# Patient Record
Sex: Male | Born: 1940 | Race: White | Hispanic: No | Marital: Married | Smoking: Former smoker
Health system: Southern US, Community
[De-identification: ages and names within clinical notes are randomized; demographics above are authoritative.]

## PROBLEM LIST (undated history)

## (undated) DIAGNOSIS — M545 Low back pain, unspecified: Secondary | ICD-10-CM

## (undated) DIAGNOSIS — Z860100 Personal history of colon polyps, unspecified: Secondary | ICD-10-CM

## (undated) DIAGNOSIS — I10 Essential (primary) hypertension: Secondary | ICD-10-CM

## (undated) DIAGNOSIS — R413 Other amnesia: Secondary | ICD-10-CM

## (undated) DIAGNOSIS — J189 Pneumonia, unspecified organism: Secondary | ICD-10-CM

## (undated) DIAGNOSIS — R3915 Urgency of urination: Secondary | ICD-10-CM

## (undated) DIAGNOSIS — M1990 Unspecified osteoarthritis, unspecified site: Secondary | ICD-10-CM

## (undated) DIAGNOSIS — G5760 Lesion of plantar nerve, unspecified lower limb: Secondary | ICD-10-CM

## (undated) DIAGNOSIS — M62838 Other muscle spasm: Secondary | ICD-10-CM

## (undated) DIAGNOSIS — K5790 Diverticulosis of intestine, part unspecified, without perforation or abscess without bleeding: Secondary | ICD-10-CM

## (undated) DIAGNOSIS — N189 Chronic kidney disease, unspecified: Secondary | ICD-10-CM

## (undated) DIAGNOSIS — R350 Frequency of micturition: Secondary | ICD-10-CM

## (undated) DIAGNOSIS — N183 Chronic kidney disease, stage 3 (moderate): Secondary | ICD-10-CM

## (undated) DIAGNOSIS — Z9289 Personal history of other medical treatment: Secondary | ICD-10-CM

## (undated) DIAGNOSIS — R600 Localized edema: Secondary | ICD-10-CM

## (undated) DIAGNOSIS — R609 Edema, unspecified: Secondary | ICD-10-CM

## (undated) DIAGNOSIS — R079 Chest pain, unspecified: Secondary | ICD-10-CM

## (undated) DIAGNOSIS — M069 Rheumatoid arthritis, unspecified: Secondary | ICD-10-CM

## (undated) DIAGNOSIS — G459 Transient cerebral ischemic attack, unspecified: Secondary | ICD-10-CM

## (undated) DIAGNOSIS — R001 Bradycardia, unspecified: Secondary | ICD-10-CM

## (undated) DIAGNOSIS — N400 Enlarged prostate without lower urinary tract symptoms: Secondary | ICD-10-CM

## (undated) DIAGNOSIS — I6529 Occlusion and stenosis of unspecified carotid artery: Secondary | ICD-10-CM

## (undated) DIAGNOSIS — M2550 Pain in unspecified joint: Secondary | ICD-10-CM

## (undated) DIAGNOSIS — G4700 Insomnia, unspecified: Secondary | ICD-10-CM

## (undated) DIAGNOSIS — IMO0001 Reserved for inherently not codable concepts without codable children: Secondary | ICD-10-CM

## (undated) DIAGNOSIS — G8929 Other chronic pain: Secondary | ICD-10-CM

## (undated) DIAGNOSIS — E785 Hyperlipidemia, unspecified: Secondary | ICD-10-CM

## (undated) DIAGNOSIS — I00 Rheumatic fever without heart involvement: Secondary | ICD-10-CM

## (undated) HISTORY — DX: Essential (primary) hypertension: I10

## (undated) HISTORY — PX: COLONOSCOPY: SHX174

## (undated) HISTORY — PX: TESTICLAR CYST EXCISION: SHX2492

## (undated) HISTORY — DX: Chronic kidney disease, stage 3 (moderate): N18.3

## (undated) HISTORY — DX: Occlusion and stenosis of unspecified carotid artery: I65.29

## (undated) HISTORY — DX: Hyperlipidemia, unspecified: E78.5

## (undated) HISTORY — PX: VASECTOMY: SHX75

## (undated) HISTORY — PX: TONSILLECTOMY: SUR1361

## (undated) HISTORY — PX: INGUINAL HERNIA REPAIR: SUR1180

## (undated) HISTORY — DX: Lesion of plantar nerve, unspecified lower limb: G57.60

## (undated) HISTORY — PX: CATARACT EXTRACTION, BILATERAL: SHX1313

---
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
## 1998-11-03 ENCOUNTER — Other Ambulatory Visit: Admission: RE | Admit: 1998-11-03 | Discharge: 1998-11-03 | Payer: Self-pay | Admitting: Gastroenterology

## 2004-07-26 ENCOUNTER — Ambulatory Visit: Payer: Self-pay | Admitting: Internal Medicine

## 2004-08-02 ENCOUNTER — Ambulatory Visit: Payer: Self-pay | Admitting: Internal Medicine

## 2004-08-30 ENCOUNTER — Ambulatory Visit: Payer: Self-pay | Admitting: Internal Medicine

## 2004-09-08 ENCOUNTER — Ambulatory Visit: Payer: Self-pay

## 2004-09-20 ENCOUNTER — Ambulatory Visit (HOSPITAL_COMMUNITY): Admission: RE | Admit: 2004-09-20 | Discharge: 2004-09-21 | Payer: Self-pay | Admitting: Vascular Surgery

## 2004-09-20 ENCOUNTER — Encounter (INDEPENDENT_AMBULATORY_CARE_PROVIDER_SITE_OTHER): Payer: Self-pay | Admitting: *Deleted

## 2004-09-20 HISTORY — PX: CAROTID ENDARTERECTOMY: SUR193

## 2004-10-04 ENCOUNTER — Ambulatory Visit: Payer: Self-pay | Admitting: Internal Medicine

## 2004-10-11 ENCOUNTER — Ambulatory Visit: Payer: Self-pay | Admitting: Internal Medicine

## 2004-12-13 ENCOUNTER — Ambulatory Visit: Payer: Self-pay | Admitting: Internal Medicine

## 2005-03-08 ENCOUNTER — Ambulatory Visit: Payer: Self-pay | Admitting: Internal Medicine

## 2005-03-15 ENCOUNTER — Ambulatory Visit: Payer: Self-pay | Admitting: Internal Medicine

## 2005-06-07 ENCOUNTER — Ambulatory Visit: Payer: Self-pay | Admitting: Internal Medicine

## 2005-06-14 ENCOUNTER — Ambulatory Visit: Payer: Self-pay | Admitting: Internal Medicine

## 2005-09-27 ENCOUNTER — Ambulatory Visit: Payer: Self-pay | Admitting: Internal Medicine

## 2005-12-06 ENCOUNTER — Ambulatory Visit: Payer: Self-pay | Admitting: Internal Medicine

## 2005-12-13 ENCOUNTER — Ambulatory Visit: Payer: Self-pay | Admitting: Internal Medicine

## 2006-01-26 ENCOUNTER — Ambulatory Visit: Payer: Self-pay | Admitting: Gastroenterology

## 2006-02-06 ENCOUNTER — Ambulatory Visit: Payer: Self-pay | Admitting: Gastroenterology

## 2006-02-06 ENCOUNTER — Encounter: Payer: Self-pay | Admitting: Gastroenterology

## 2006-02-20 ENCOUNTER — Ambulatory Visit: Payer: Self-pay | Admitting: Internal Medicine

## 2006-05-15 ENCOUNTER — Ambulatory Visit: Payer: Self-pay | Admitting: Internal Medicine

## 2006-07-07 ENCOUNTER — Ambulatory Visit: Payer: Self-pay | Admitting: Internal Medicine

## 2006-12-13 ENCOUNTER — Ambulatory Visit: Payer: Self-pay | Admitting: Internal Medicine

## 2007-01-03 DIAGNOSIS — E785 Hyperlipidemia, unspecified: Secondary | ICD-10-CM

## 2007-01-03 DIAGNOSIS — I10 Essential (primary) hypertension: Secondary | ICD-10-CM

## 2007-01-12 ENCOUNTER — Ambulatory Visit: Payer: Self-pay | Admitting: Vascular Surgery

## 2007-01-12 ENCOUNTER — Encounter: Payer: Self-pay | Admitting: Internal Medicine

## 2007-01-30 ENCOUNTER — Ambulatory Visit: Payer: Self-pay | Admitting: Internal Medicine

## 2007-01-30 LAB — CONVERTED CEMR LAB
AST: 24 units/L (ref 0–37)
Bilirubin, Direct: 0.1 mg/dL (ref 0.0–0.3)
CO2: 26 meq/L (ref 19–32)
Eosinophils Absolute: 0.3 10*3/uL (ref 0.0–0.6)
Eosinophils Relative: 4 % (ref 0.0–5.0)
GFR calc Af Amer: 96 mL/min
GFR calc non Af Amer: 79 mL/min
Glucose, Bld: 138 mg/dL — ABNORMAL HIGH (ref 70–99)
HCT: 38.4 % — ABNORMAL LOW (ref 39.0–52.0)
Hemoglobin: 13.3 g/dL (ref 13.0–17.0)
Ketones, urine, test strip: NEGATIVE
Lymphocytes Relative: 30.7 % (ref 12.0–46.0)
MCV: 88.4 fL (ref 78.0–100.0)
Neutro Abs: 4.2 10*3/uL (ref 1.4–7.7)
Neutrophils Relative %: 55.2 % (ref 43.0–77.0)
Nitrite: NEGATIVE
PSA: 1.46 ng/mL (ref 0.10–4.00)
Sodium: 138 meq/L (ref 135–145)
Specific Gravity, Urine: 1.02
TSH: 1.35 microintl units/mL (ref 0.35–5.50)
Total Protein: 7.1 g/dL (ref 6.0–8.3)
WBC Urine, dipstick: NEGATIVE
WBC: 7.7 10*3/uL (ref 4.5–10.5)

## 2007-02-05 ENCOUNTER — Ambulatory Visit: Payer: Self-pay | Admitting: Internal Medicine

## 2007-02-05 DIAGNOSIS — R7301 Impaired fasting glucose: Secondary | ICD-10-CM | POA: Insufficient documentation

## 2007-05-28 ENCOUNTER — Ambulatory Visit: Payer: Self-pay | Admitting: Internal Medicine

## 2007-05-28 LAB — CONVERTED CEMR LAB
Basophils Relative: 0.8 % (ref 0.0–1.0)
Bilirubin, Direct: 0.1 mg/dL (ref 0.0–0.3)
CO2: 27 meq/L (ref 19–32)
Cholesterol: 175 mg/dL (ref 0–200)
Creatinine, Ser: 1.2 mg/dL (ref 0.4–1.5)
Direct LDL: 55.2 mg/dL
Eosinophils Relative: 4.1 % (ref 0.0–5.0)
GFR calc Af Amer: 77 mL/min
Glucose, Bld: 138 mg/dL — ABNORMAL HIGH (ref 70–99)
Glucose, Urine, Semiquant: NEGATIVE
HCT: 39.7 % (ref 39.0–52.0)
Hemoglobin: 13.6 g/dL (ref 13.0–17.0)
Lymphocytes Relative: 33.6 % (ref 12.0–46.0)
Monocytes Absolute: 0.7 10*3/uL (ref 0.2–0.7)
Monocytes Relative: 8.8 % (ref 3.0–11.0)
Neutro Abs: 4.3 10*3/uL (ref 1.4–7.7)
Neutrophils Relative %: 52.7 % (ref 43.0–77.0)
Nitrite: NEGATIVE
Potassium: 5.4 meq/L — ABNORMAL HIGH (ref 3.5–5.1)
Protein, U semiquant: NEGATIVE
RDW: 12.3 % (ref 11.5–14.6)
Sodium: 140 meq/L (ref 135–145)
Specific Gravity, Urine: 1.015
TSH: 1.38 microintl units/mL (ref 0.35–5.50)
Total Bilirubin: 0.6 mg/dL (ref 0.3–1.2)
Total CHOL/HDL Ratio: 6.3
Total Protein: 7 g/dL (ref 6.0–8.3)
VLDL: 63 mg/dL — ABNORMAL HIGH (ref 0–40)
WBC Urine, dipstick: NEGATIVE

## 2007-06-04 ENCOUNTER — Ambulatory Visit: Payer: Self-pay | Admitting: Internal Medicine

## 2007-06-04 DIAGNOSIS — H900 Conductive hearing loss, bilateral: Secondary | ICD-10-CM

## 2007-06-04 DIAGNOSIS — N400 Enlarged prostate without lower urinary tract symptoms: Secondary | ICD-10-CM

## 2007-06-04 LAB — CONVERTED CEMR LAB: HDL goal, serum: 40 mg/dL

## 2007-06-12 ENCOUNTER — Encounter: Payer: Self-pay | Admitting: Internal Medicine

## 2007-06-15 ENCOUNTER — Encounter: Payer: Self-pay | Admitting: Internal Medicine

## 2007-08-02 ENCOUNTER — Ambulatory Visit: Payer: Self-pay | Admitting: Internal Medicine

## 2007-10-08 ENCOUNTER — Ambulatory Visit: Payer: Self-pay | Admitting: Internal Medicine

## 2007-10-08 DIAGNOSIS — M25569 Pain in unspecified knee: Secondary | ICD-10-CM | POA: Insufficient documentation

## 2007-11-02 ENCOUNTER — Ambulatory Visit: Payer: Self-pay | Admitting: Internal Medicine

## 2007-11-02 DIAGNOSIS — M1710 Unilateral primary osteoarthritis, unspecified knee: Secondary | ICD-10-CM

## 2008-02-11 ENCOUNTER — Ambulatory Visit: Payer: Self-pay | Admitting: Internal Medicine

## 2008-02-11 LAB — CONVERTED CEMR LAB
BUN: 19 mg/dL (ref 6–23)
Chloride: 111 meq/L (ref 96–112)
Creatinine, Ser: 1.2 mg/dL (ref 0.4–1.5)
GFR calc Af Amer: 77 mL/min
GFR calc non Af Amer: 64 mL/min
HDL: 34.1 mg/dL — ABNORMAL LOW (ref >39.0)
Hgb A1c MFr Bld: 6 % (ref 4.6–6.0)
Potassium: 4.5 meq/L (ref 3.5–5.1)
Total CHOL/HDL Ratio: 4.5
VLDL: 59 mg/dL — ABNORMAL HIGH (ref 0–40)

## 2008-05-30 HISTORY — PX: ABDOMINAL AORTIC ANEURYSM REPAIR: SUR1152

## 2008-11-20 ENCOUNTER — Ambulatory Visit: Payer: Self-pay | Admitting: Internal Medicine

## 2008-11-20 LAB — CONVERTED CEMR LAB
ALT: 43 units/L (ref 0–53)
Albumin: 4.1 g/dL (ref 3.5–5.2)
Basophils Relative: 0.9 % (ref 0.0–3.0)
Bilirubin Urine: NEGATIVE
Bilirubin, Direct: 0 mg/dL (ref 0.0–0.3)
CO2: 26 meq/L (ref 19–32)
Chloride: 106 meq/L (ref 96–112)
Eosinophils Relative: 5 % (ref 0.0–5.0)
Glucose, Urine, Semiquant: NEGATIVE
HCT: 35.6 % — ABNORMAL LOW (ref 39.0–52.0)
Hemoglobin: 12.7 g/dL — ABNORMAL LOW (ref 13.0–17.0)
Lymphs Abs: 2.1 10*3/uL (ref 0.7–4.0)
MCHC: 35.8 g/dL (ref 30.0–36.0)
MCV: 89.5 fL (ref 78.0–100.0)
Monocytes Absolute: 0.5 10*3/uL (ref 0.1–1.0)
Neutro Abs: 3.2 10*3/uL (ref 1.4–7.7)
PSA: 1.31 ng/mL (ref 0.10–4.00)
Potassium: 4.4 meq/L (ref 3.5–5.1)
Protein, U semiquant: NEGATIVE
RBC: 3.98 M/uL — ABNORMAL LOW (ref 4.22–5.81)
Total CHOL/HDL Ratio: 5
Total Protein: 7.1 g/dL (ref 6.0–8.3)
Urobilinogen, UA: 0.2
VLDL: 50.2 mg/dL — ABNORMAL HIGH (ref 0.0–40.0)
WBC Urine, dipstick: NEGATIVE
WBC: 6.2 10*3/uL (ref 4.5–10.5)

## 2008-12-05 ENCOUNTER — Ambulatory Visit: Payer: Self-pay | Admitting: Internal Medicine

## 2008-12-05 DIAGNOSIS — D649 Anemia, unspecified: Secondary | ICD-10-CM | POA: Insufficient documentation

## 2008-12-08 ENCOUNTER — Telehealth: Payer: Self-pay | Admitting: *Deleted

## 2009-03-09 ENCOUNTER — Ambulatory Visit: Payer: Self-pay | Admitting: Internal Medicine

## 2009-03-09 DIAGNOSIS — L570 Actinic keratosis: Secondary | ICD-10-CM

## 2009-03-09 LAB — CONVERTED CEMR LAB

## 2009-04-07 ENCOUNTER — Ambulatory Visit: Payer: Self-pay | Admitting: Vascular Surgery

## 2009-04-07 ENCOUNTER — Ambulatory Visit: Payer: Self-pay | Admitting: Internal Medicine

## 2009-04-07 ENCOUNTER — Inpatient Hospital Stay (HOSPITAL_COMMUNITY): Admission: EM | Admit: 2009-04-07 | Discharge: 2009-04-12 | Payer: Self-pay | Admitting: Emergency Medicine

## 2009-04-07 ENCOUNTER — Encounter: Payer: Self-pay | Admitting: Vascular Surgery

## 2009-04-07 DIAGNOSIS — Z9889 Other specified postprocedural states: Secondary | ICD-10-CM

## 2009-04-08 ENCOUNTER — Encounter (INDEPENDENT_AMBULATORY_CARE_PROVIDER_SITE_OTHER): Payer: Self-pay | Admitting: Internal Medicine

## 2009-04-21 ENCOUNTER — Ambulatory Visit: Payer: Self-pay | Admitting: Vascular Surgery

## 2009-05-06 ENCOUNTER — Ambulatory Visit: Payer: Self-pay | Admitting: Vascular Surgery

## 2009-05-06 ENCOUNTER — Encounter: Payer: Self-pay | Admitting: Internal Medicine

## 2009-05-08 ENCOUNTER — Telehealth: Payer: Self-pay | Admitting: Internal Medicine

## 2009-06-03 ENCOUNTER — Ambulatory Visit: Payer: Self-pay | Admitting: Vascular Surgery

## 2009-06-08 ENCOUNTER — Ambulatory Visit: Payer: Self-pay | Admitting: Internal Medicine

## 2009-06-10 LAB — CONVERTED CEMR LAB
CO2: 29 meq/L (ref 19–32)
Eosinophils Relative: 3.9 % (ref 0.0–5.0)
Glucose, Bld: 111 mg/dL — ABNORMAL HIGH (ref 70–99)
Monocytes Relative: 7.1 % (ref 3.0–12.0)
Neutrophils Relative %: 61.1 % (ref 43.0–77.0)
Platelets: 158 10*3/uL (ref 150.0–400.0)
Potassium: 4.1 meq/L (ref 3.5–5.1)
Sodium: 137 meq/L (ref 135–145)
WBC: 8.5 10*3/uL (ref 4.5–10.5)

## 2009-06-19 ENCOUNTER — Ambulatory Visit: Payer: Self-pay | Admitting: Vascular Surgery

## 2009-07-13 ENCOUNTER — Ambulatory Visit: Payer: Self-pay | Admitting: Internal Medicine

## 2009-07-13 LAB — CONVERTED CEMR LAB
BUN: 14 mg/dL (ref 6–23)
Basophils Absolute: 0.1 10*3/uL (ref 0.0–0.1)
Chloride: 107 meq/L (ref 96–112)
Glucose, Bld: 102 mg/dL — ABNORMAL HIGH (ref 70–99)
HCT: 36.7 % — ABNORMAL LOW (ref 39.0–52.0)
Hemoglobin: 12.2 g/dL — ABNORMAL LOW (ref 13.0–17.0)
Hgb A1c MFr Bld: 6.1 % (ref 4.6–6.5)
Lymphs Abs: 2.4 10*3/uL (ref 0.7–4.0)
Monocytes Relative: 7.7 % (ref 3.0–12.0)
Neutro Abs: 5 10*3/uL (ref 1.4–7.7)
Potassium: 4.5 meq/L (ref 3.5–5.1)
RDW: 13 % (ref 11.5–14.6)

## 2009-09-07 ENCOUNTER — Ambulatory Visit: Payer: Self-pay | Admitting: Internal Medicine

## 2009-09-07 LAB — CONVERTED CEMR LAB
Basophils Relative: 0.9 % (ref 0.0–3.0)
CO2: 33 meq/L — ABNORMAL HIGH (ref 19–32)
Calcium: 9.1 mg/dL (ref 8.4–10.5)
Chloride: 104 meq/L (ref 96–112)
Eosinophils Relative: 4.2 % (ref 0.0–5.0)
Glucose, Bld: 114 mg/dL — ABNORMAL HIGH (ref 70–99)
Hemoglobin: 13.4 g/dL (ref 13.0–17.0)
Lymphocytes Relative: 34.8 % (ref 12.0–46.0)
Monocytes Relative: 7.9 % (ref 3.0–12.0)
Neutro Abs: 4.3 10*3/uL (ref 1.4–7.7)
Potassium: 4.5 meq/L (ref 3.5–5.1)
RBC: 4.44 M/uL (ref 4.22–5.81)
Saturation Ratios: 31.2 % (ref 20.0–50.0)
Sodium: 146 meq/L — ABNORMAL HIGH (ref 135–145)
Transferrin: 210.4 mg/dL — ABNORMAL LOW (ref 212.0–360.0)
WBC: 8.3 10*3/uL (ref 4.5–10.5)

## 2009-10-05 ENCOUNTER — Ambulatory Visit: Payer: Self-pay | Admitting: Internal Medicine

## 2009-10-05 DIAGNOSIS — G250 Essential tremor: Secondary | ICD-10-CM

## 2009-11-02 ENCOUNTER — Ambulatory Visit: Payer: Self-pay | Admitting: Internal Medicine

## 2009-11-02 DIAGNOSIS — G5760 Lesion of plantar nerve, unspecified lower limb: Secondary | ICD-10-CM | POA: Insufficient documentation

## 2009-11-02 HISTORY — DX: Lesion of plantar nerve, unspecified lower limb: G57.60

## 2009-12-21 ENCOUNTER — Ambulatory Visit: Payer: Self-pay | Admitting: Family Medicine

## 2009-12-21 DIAGNOSIS — M76899 Other specified enthesopathies of unspecified lower limb, excluding foot: Secondary | ICD-10-CM

## 2009-12-23 ENCOUNTER — Ambulatory Visit: Payer: Self-pay | Admitting: Family Medicine

## 2009-12-23 DIAGNOSIS — G894 Chronic pain syndrome: Secondary | ICD-10-CM

## 2009-12-24 ENCOUNTER — Telehealth: Payer: Self-pay | Admitting: Family Medicine

## 2009-12-29 ENCOUNTER — Encounter: Admission: RE | Admit: 2009-12-29 | Discharge: 2009-12-29 | Payer: Self-pay | Admitting: Family Medicine

## 2010-01-21 ENCOUNTER — Ambulatory Visit: Payer: Self-pay | Admitting: Vascular Surgery

## 2010-02-24 ENCOUNTER — Ambulatory Visit: Payer: Self-pay | Admitting: Internal Medicine

## 2010-02-24 LAB — CONVERTED CEMR LAB
Bilirubin, Direct: 0.1 mg/dL (ref 0.0–0.3)
Direct LDL: 81.5 mg/dL
HDL: 38 mg/dL — ABNORMAL LOW (ref >39.00)
Total Bilirubin: 0.5 mg/dL (ref 0.3–1.2)
Triglycerides: 469 mg/dL — ABNORMAL HIGH (ref 0.0–149.0)
VLDL: 93.8 mg/dL — ABNORMAL HIGH (ref 0.0–40.0)

## 2010-03-03 ENCOUNTER — Ambulatory Visit: Payer: Self-pay | Admitting: Internal Medicine

## 2010-06-16 ENCOUNTER — Ambulatory Visit
Admission: RE | Admit: 2010-06-16 | Discharge: 2010-06-16 | Payer: Self-pay | Source: Home / Self Care | Attending: Internal Medicine | Admitting: Internal Medicine

## 2010-06-16 ENCOUNTER — Other Ambulatory Visit: Payer: Self-pay | Admitting: Internal Medicine

## 2010-06-16 LAB — LDL CHOLESTEROL, DIRECT: Direct LDL: 57.1 mg/dL

## 2010-06-16 LAB — HEPATIC FUNCTION PANEL
ALT: 44 U/L (ref 0–53)
AST: 28 U/L (ref 0–37)
Albumin: 4 g/dL (ref 3.5–5.2)
Alkaline Phosphatase: 50 U/L (ref 39–117)
Bilirubin, Direct: 0.1 mg/dL (ref 0.0–0.3)
Total Bilirubin: 0.4 mg/dL (ref 0.3–1.2)
Total Protein: 6.8 g/dL (ref 6.0–8.3)

## 2010-06-16 LAB — LIPID PANEL
Cholesterol: 169 mg/dL (ref 0–200)
HDL: 33 mg/dL — ABNORMAL LOW (ref >39.00)
Total CHOL/HDL Ratio: 5
Triglycerides: 317 mg/dL — ABNORMAL HIGH (ref 0.0–149.0)
VLDL: 63.4 mg/dL — ABNORMAL HIGH (ref 0.0–40.0)

## 2010-06-16 LAB — HEMOGLOBIN A1C: Hgb A1c MFr Bld: 6.3 % (ref 4.6–6.5)

## 2010-06-20 ENCOUNTER — Encounter: Payer: Self-pay | Admitting: Emergency Medicine

## 2010-06-21 NOTE — EKG Historical (Signed)
°

## 2010-06-23 ENCOUNTER — Ambulatory Visit
Admission: RE | Admit: 2010-06-23 | Discharge: 2010-06-23 | Payer: Self-pay | Source: Home / Self Care | Attending: Internal Medicine | Admitting: Internal Medicine

## 2010-06-27 LAB — CONVERTED CEMR LAB
Basophils Relative: 1 % (ref 0–1)
Folate: >20 ng/mL
HCT: 35.7 % — ABNORMAL LOW (ref 39.0–52.0)
Hemoglobin: 12.2 g/dL — ABNORMAL LOW (ref 13.0–17.0)
Lymphocytes Relative: 29 % (ref 12–46)
MCHC: 34.2 g/dL (ref 30.0–36.0)
Monocytes Absolute: 0.8 10*3/uL (ref 0.1–1.0)
Monocytes Relative: 11 % (ref 3–12)
Neutro Abs: 4.4 10*3/uL (ref 1.7–7.7)
RBC: 4.14 M/uL — ABNORMAL LOW (ref 4.22–5.81)
TIBC: 339 ug/dL (ref 215–435)
Vitamin B-12: 546 pg/mL (ref 211–911)

## 2010-06-29 NOTE — Assessment & Plan Note (Signed)
Summary: 3 month rov.nrj   Vital Signs:  Patient profile:   70 year old male Height:      69 inches Weight:      196 pounds BMI:     29.05 Temp:     98.2 degrees F oral Pulse rate:   76 / minute Resp:     14 per minute BP sitting:   150 / 80  (left arm)  Vitals Entered By: Allyne Gee, LPN (January 10, 624THL 12:54 PM) CC: roa- had surgery for ruptured AAA- stop azor for low bp but still on ziac, Hypertension Management   CC:  roa- had surgery for ruptured AAA- stop azor for low bp but still on ziac and Hypertension Management.  History of Present Illness: RISKS FOR ANURYSM: HYPERLIPIDEMIA, HTN AND PAD    Hypertension History:      He denies headache, chest pain, palpitations, dyspnea with exertion, orthopnea, PND, peripheral edema, visual symptoms, neurologic problems, syncope, and side effects from treatment.        Positive major cardiovascular risk factors include male age 13 years old or older, diabetes, hyperlipidemia, and hypertension.  Negative major cardiovascular risk factors include non-tobacco-user status.     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 Once Daily 2)  Melatonin 3 Mg  Tabs (Melatonin) .... Take 1 Tablet By Mouth At Bedtime 3)  Niacin 500 Mg  Tbcr (Niacin) .... Once Daily 4)   Baby Aspirin 81 Mg  Chew (Aspirin) .... Once Daily 5)  Vytorin 10-20 Mg  Tabs (Ezetimibe-Simvastatin) .... Once Daily 6)  Azor 5-20 Mg  Tabs (Amlodipine-Olmesartan) .... One By Mouth Daily 7)  Vitamin C 500 Mg  Chew (Ascorbic Acid) .... Once Daily 8)  Osteo Bi-Flex Regular Strength 250-200 Mg  Tabs (Glucosamine-Chondroitin) .... Once Daily 9)  Omega-3 1000 Mg  Caps (Omega-3 Fatty Acids) .... Once Daily 10)  Flomax 0.4 Mg  Cp24 (Tamsulosin Hcl) .... One By Mouth Q Ac Supper 11)  Saw Palmetto 1000 Mg  Caps (Saw Palmetto (Serenoa Repens)) .... Once Daily 12)  Diclofenac Sodium 75 Mg Tbec (Diclofenac Sodium) .Marland Kitchen.. 1 Two Times A Day 13)  Bisoprolol-Hydrochlorothiazide 5-6.25 Mg Tabs (Bisoprolol-Hydrochlorothiazide) .... One By Mouth Daily  Current Medications (verified): 1)  Daily Combo Multi Vitamins   Tabs (Multiple Vitamins-Minerals) .... Once Daily 2)  Melatonin 3 Mg  Tabs (Melatonin) .... Take 1 Tablet By Mouth At Bedtime 3)  Niacin 500 Mg  Tbcr (Niacin) .... Once Daily 4)  Baby Aspirin 81 Mg  Chew (Aspirin) .... Once Daily 5)  Vytorin 10-20 Mg  Tabs (Ezetimibe-Simvastatin) .... Once Daily 6)  Vitamin C 500 Mg  Chew (Ascorbic Acid) .... Once Daily 7)  Osteo Bi-Flex Regular Strength 250-200 Mg  Tabs (Glucosamine-Chondroitin) .... Once Daily 8)  Omega-3 1000 Mg  Caps (Omega-3 Fatty Acids) .... Once Daily  9)  Flomax 0.4 Mg  Cp24 (Tamsulosin Hcl) .... One By Mouth Q Ac Supper 10)  Saw Palmetto 1000 Mg  Caps (Saw Palmetto (Serenoa Repens)) .... Once Daily 11)  Bisoprolol-Hydrochlorothiazide 5-6.25 Mg Tabs (Bisoprolol-Hydrochlorothiazide) .... One By Mouth Daily 12)  Tizanidine Hcl 4 Mg Tabs (Tizanidine Hcl) .... One By Mouth Ghs As Needed Muscle Spazm  Allergies (verified): No Known Drug Allergies  Past History:  Family History: Last updated: 02/14/2007 father died with parkinsons in 30's mother Family History of Colon CA 1st degree relative <60  Social History: Last updated:  02/14/07 Married Former Smoker  Risk Factors: Exercise: no (02/14/2007)  Risk Factors: Smoking Status: quit (06/08/2009)  Past medical, surgical, family and social histories (including risk factors) reviewed for relevance to current acute and chronic problems.  Past Medical History: Reviewed history from 2007-02-14 and no changes required. Diabetes mellitus, type II Hyperlipidemia Hypertension  Past Surgical History: Reviewed history from 14-Feb-2007 and no changes required. Carotid endarterectomy testicularcyst  Family History: Reviewed history from 02-14-07 and no changes required. father died with parkinsons in 52's mother Family History of Colon CA 1st degree relative <60  Social History: Reviewed history from 2007/02/14 and no changes required. Married Former Smoker  Review of Systems  The patient denies anorexia, fever, weight loss, weight gain, vision loss, decreased hearing, hoarseness, chest pain, syncope, dyspnea on exertion, peripheral edema, prolonged cough, headaches, hemoptysis, abdominal pain, melena, hematochezia, severe indigestion/heartburn, hematuria, incontinence, genital sores, muscle weakness, suspicious skin lesions, transient blindness, difficulty walking, depression, unusual weight change, abnormal bleeding, enlarged lymph nodes, angioedema, and breast masses.    Physical Exam  General:  Well-developed,well-nourished,in no acute distress; alert,appropriate and cooperative throughout examination Head:  Normocephalic and atraumatic without obvious abnormalities. No apparent alopecia or balding. Eyes:  vision grossly intact, pupils equal, and pupils round.   Nose:  no external deformity and no nasal discharge.   Mouth:  pharynx pink and moist and no erythema.   Neck:  supple.   Lungs:  normal respiratory effort and no wheezes.   Heart:  normal rate and regular rhythm.   Abdomen:  abdominal scar for AAA   Impression &  Recommendations:  Problem # 1:  LOC OSTEOARTHROS NOT SPEC PRIM/SEC LOWER LEG (ICD-715.36)  the pt has not been taking the diclofenac he has a slight groin pull The following medications were removed from the medication list:    Diclofenac Sodium 75 Mg Tbec (Diclofenac sodium) .Marland Kitchen... 1 two times a day His updated medication list for this problem includes:    Baby Aspirin 81 Mg Chew (Aspirin) ..... Once daily  Discussed use of medications, application of heat or cold, and exercises.   Problem # 2:  DIABETES MELLITUS, TYPE II (ICD-250.00)  The following medications were removed from the medication list:    Azor 5-20 Mg Tabs (Amlodipine-olmesartan) ..... One by mouth daily His updated medication list for this problem includes:    Baby Aspirin 81 Mg Chew (Aspirin) ..... Once daily  Orders: TLB-A1C / Hgb A1C (Glycohemoglobin) (83036-A1C)  Labs Reviewed: Creat: 1.3 (11/20/2008)    Reviewed HgBA1c results: 5.9 (12/05/2008)  6.0 (02/11/2008)  Complete Medication List: 1)  Daily Combo Multi Vitamins Tabs (Multiple vitamins-minerals) .... Once daily 2)  Melatonin 3 Mg Tabs (Melatonin) .... Take 1 tablet by mouth at bedtime 3)  Niacin 500 Mg Tbcr (Niacin) .... Once daily 4)  Baby Aspirin 81 Mg Chew (Aspirin) .... Once daily 5)  Vytorin 10-20 Mg Tabs (Ezetimibe-simvastatin) .... Once daily 6)  Vitamin C 500 Mg Chew (Ascorbic acid) .... Once daily 7)  Osteo Bi-flex Regular Strength 250-200 Mg Tabs (Glucosamine-chondroitin) .... Once daily 8)  Omega-3 1000 Mg Caps (Omega-3 fatty acids) .... Once daily 9)  Flomax 0.4 Mg Cp24 (Tamsulosin hcl) .... One by mouth q ac supper 10)  Saw Palmetto 1000 Mg Caps (Saw palmetto (serenoa repens)) .... Once daily 11)  Bisoprolol-hydrochlorothiazide 5-6.25 Mg Tabs (Bisoprolol-hydrochlorothiazide) .... One by mouth daily 12)  Tizanidine Hcl 4 Mg Tabs (Tizanidine hcl) .... One by mouth ghs as needed muscle spazm  Other Orders: Venipuncture [id number]) TLB-CBC  Platelet - w/Differential (85025-CBCD) TLB-BMP (Basic Metabolic Panel-BMET) ([id number])  Hypertension Assessment/Plan:      The patient's hypertensive risk group is category C: Target organ damage and/or diabetes.  Today's blood pressure is 150/80.  His blood pressure goal is < 130/80.  Patient Instructions: 1)  Please schedule a follow-up appointment in 1 month. 2)  TAKE THE FULL [facility] 5/625 3)  IF THE BOOD PRESSURE STATS TO CLIMB ABOVE 130/85  4)  ADD 1/2 OF THE AZOR Prescriptions: BISOPROLOL-HYDROCHLOROTHIAZIDE 5-6.25 MG TABS (BISOPROLOL-HYDROCHLOROTHIAZIDE) one by mouth daily  #30 x 11   Entered and Authorized by:   Ricard Dillon MD   Signed by:   Ricard Dillon MD on 06/08/2009   Method used:   Electronically to        [facility]  [phone]* (retail)       [address]       [place], [place]  [address]       Ph: [phone] or [phone]       Fax: [phone]   RxIDXD:[id number] TIZANIDINE HCL 4 MG TABS (TIZANIDINE HCL) one by mouth Ghs as needed MUSCLE SPAZM  #30 x 4   Entered and Authorized by:   Ricard Dillon MD   Signed by:   Ricard Dillon MD on 06/08/2009   Method used:   Electronically to        [facility]  [phone]* (retail)       [address]       [place],   [address]       Ph: [phone] or [phone]       Fax: [phone]   RxID:   [id number]

## 2010-06-29 NOTE — Letter (Signed)
Summary: [facility]  [facility]   Imported By: Laural Benes 06/05/2009 10:05:45  _____________________________________________________________________  External Attachment:    Type:   Image     Comment:   External Document

## 2010-06-29 NOTE — Assessment & Plan Note (Signed)
Summary: right hand tremors x 1 month rov/njr   Vital Signs:  Patient profile:   70 year old male Height:      69 inches Weight:      202 pounds BMI:     29.94 Temp:     98.2 degrees F oral Pulse rate:   76 / minute Resp:     14 per minute BP sitting:   140 / 80  (left arm)  Vitals Entered By: Allyne Gee, LPN (May  9, 624THL [id number] PM) CC: c/ort hand tremors at times- but pt can control it if he is aware of it, Hypertension Management   CC:  c/ort hand tremors at times- but pt can control it if he is aware of it and Hypertension Management.  History of Present Illness: hand tremor is more of an intention type with no pill rolling seen he denies gate issues or weekness he is compliant with his medications  Hypertension History:      He denies headache, chest pain, palpitations, dyspnea with exertion, orthopnea, PND, peripheral edema, visual symptoms, neurologic problems, syncope, and side effects from treatment.  recheck 140/80.        Positive major cardiovascular risk factors include male age 33 years old or older, diabetes, hyperlipidemia, and hypertension.  Negative major cardiovascular risk factors include non-tobacco-user status.      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) 9)  Family History of Colon Ca 1st Degree Relative <60  (ICD-V16.0) 10)  Diabetes Mellitus, Type II  (ICD-250.00) 11)  Preventive Health Care  (ICD-V70.0) 12)  Hypertension  (ICD-401.9) 13)  Hyperlipidemia  (ICD-272.4)  Current Problems  (verified): 1)  Hx of Abdominal Aortic Aneurysm Repair, Hx of  (ICD-V15.1) 2)  Actinic Keratosis, Ear, Left  (ICD-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 Once Daily 2)  Melatonin 3 Mg  Tabs (Melatonin) .... Take 1 Tablet By Mouth At Bedtime 3)  Niacin 500 Mg  Tbcr (Niacin) .... Once Daily 4)  Baby Aspirin 81 Mg  Chew (Aspirin) .... Once Daily 5)  Vytorin 10-20 Mg  Tabs (Ezetimibe-Simvastatin) .... Once Daily 6)  Vitamin C 500 Mg  Chew (Ascorbic Acid) .... Once Daily 7)  Omega-3 1000 Mg  Caps (Omega-3 Fatty Acids) .... 2 Once Daily 8)  Flomax 0.4 Mg  Cp24 (Tamsulosin Hcl) .... One By Mouth Q Ac Supper 9)  Saw Palmetto 1000 Mg  Caps (Saw Palmetto (Serenoa Repens)) .... Once Daily 10)  Bisoprolol-Hydrochlorothiazide 10-6.25 Mg Tabs (Bisoprolol-Hydrochlorothiazide) .... One By Mouth Daily 11)  Tizanidine  Hcl 4 Mg Tabs (Tizanidine Hcl) .... One By Mouth Ghs As Needed Muscle Spazm 12)  Benicar 20 Mg Tabs (Olmesartan Medoxomil) .... One By Mouth Daily  Current Medications (verified): 1)  Daily Combo Multi Vitamins   Tabs (Multiple Vitamins-Minerals) .... Once Daily 2)  Melatonin 3 Mg  Tabs (Melatonin) .... Take 1 Tablet By Mouth At Bedtime 3)  Niacin 500 Mg  Tbcr (Niacin) .... Once Daily 4)  Baby Aspirin 81 Mg  Chew (Aspirin) .... Once Daily 5)  Vytorin 10-20 Mg  Tabs (Ezetimibe-Simvastatin) .... Once Daily 6)  Vitamin C 500 Mg  Chew (Ascorbic Acid) .... Once Daily 7)   Omega-3 1000 Mg  Caps (Omega-3 Fatty Acids) .... 2 Once Daily 8)  Flomax 0.4 Mg  Cp24 (Tamsulosin Hcl) .... One By Mouth Q Ac Supper 9)  Saw Palmetto 1000 Mg  Caps (Saw Palmetto (Serenoa Repens)) .... Once Daily 10)  Bisoprolol-Hydrochlorothiazide 10-6.25 Mg Tabs (Bisoprolol-Hydrochlorothiazide) .... One By Mouth Daily 11)  Tizanidine Hcl 4 Mg Tabs (Tizanidine Hcl) .... One By Mouth Ghs As Needed Muscle Spazm 12)  Benicar 20 Mg Tabs (Olmesartan Medoxomil) .... One By Mouth Daily 13)  Folbee Plus Cz 5 Mg Tabs (B-Complex-C-Biotin-Minerals-Fa) .... One By Mouth Daily  Allergies (verified): No Known Drug Allergies  Past History:  Family History: Last updated: 18-Feb-2007 father died with parkinsons in 35's mother Family History of Colon CA 1st degree relative <60  Social History: Last updated: 02-18-07 Married Former Smoker  Risk Factors: Exercise: no (18-Feb-2007)  Risk Factors: Smoking Status: quit (10/05/2009)  Past medical, surgical, family and social histories (including risk factors) reviewed, and no changes noted (except as noted below).  Past Medical History: Reviewed history from 02/18/07 and no changes required. Diabetes mellitus, type II Hyperlipidemia Hypertension  Past Surgical History: Reviewed history from February 18, 2007 and no changes required. Carotid endarterectomy testicularcyst  Family History: Reviewed history from 18-Feb-2007 and no changes required. father died with parkinsons in 34's mother Family History of Colon CA 1st degree relative <60  Social History: Reviewed history from 02/18/2007 and no changes required. Married Former Smoker  Review of Systems  The patient denies anorexia, fever, weight loss, weight gain, vision loss, decreased hearing, hoarseness, chest pain, syncope, dyspnea on exertion, peripheral edema, prolonged cough, headaches, hemoptysis, abdominal pain, melena, hematochezia, severe indigestion/heartburn, hematuria,  incontinence, genital sores, muscle weakness, suspicious skin lesions, transient blindness, difficulty walking, depression, unusual weight change, abnormal bleeding, enlarged lymph nodes, angioedema, breast masses, and testicular masses.    Physical Exam  General:  Well-developed,well-nourished,in no acute distress; alert,appropriate and cooperative throughout examination Head:  Normocephalic and atraumatic without obvious abnormalities. No apparent alopecia or balding. Eyes:  vision grossly intact, pupils equal, and pupils round.   Nose:  no external deformity and no nasal discharge.   Mouth:  pharynx pink and moist and no erythema.   Neck:  supple.   Lungs:  normal respiratory effort and no wheezes.   Heart:  normal rate and regular rhythm.   Abdomen:  abdominal scar for AAA Neurologic:  hand tremor worsened with grip   Impression & Recommendations:  Problem # 1:  TREMOR, ESSENTIAL (ICD-333.1) consideraton for restless leg medicatiosn for tremor this is apparently an intension or benigh essintial tremor  Problem # 2:  UNSPECIFIED ANEMIA (ICD-285.9) Assessment: Unchanged add a multi b vit Hgb: 13.4 (09/07/2009)   Hct: 38.6 (09/07/2009)   Platelets: 169.0 (09/07/2009) RBC: 4.44 (09/07/2009)   RDW: 14.5 (09/07/2009)   WBC: 8.3 (09/07/2009) MCV: 86.9 (09/07/2009)  MCHC: 34.8 (09/07/2009) Iron: 92 (09/07/2009)   TIBC: 339 (12/05/2008)   % Sat: 31.2 (09/07/2009) B12: 546 (12/05/2008)   Folate: >20.0 ng/mL (12/05/2008)   TSH: 1.22 (11/20/2008)  Problem # 3:  DIABETES MELLITUS, TYPE II (ICD-250.00) Assessment: Unchanged  His updated medication list for this problem includes:    Baby Aspirin 81 Mg Chew (Aspirin) ..... Once daily    Benicar 20 Mg Tabs (Olmesartan medoxomil) ..... One by mouth daily  Labs Reviewed: Creat: 1.2 (09/07/2009)    Reviewed HgBA1c results: 6.4 (09/07/2009)  6.1 (07/13/2009)  Complete Medication List: 1)  Daily Combo Multi Vitamins Tabs (Multiple  vitamins-minerals) .... Once daily 2)  Melatonin 3 Mg Tabs (Melatonin) .... Take 1 tablet by mouth at bedtime 3)  Niacin 500 Mg Tbcr (Niacin) .... Once daily 4)  Baby Aspirin 81 Mg Chew (Aspirin) .... Once daily 5)  Vytorin 10-20 Mg Tabs (Ezetimibe-simvastatin) .... Once daily 6)  Vitamin C 500 Mg Chew (Ascorbic acid) .... Once daily 7)  Omega-3 1000 Mg Caps (Omega-3 fatty acids) .... 2 once daily 8)  Flomax 0.4 Mg Cp24 (Tamsulosin hcl) .... One by mouth q ac supper 9)  Saw Palmetto 1000 Mg Caps (Saw palmetto (serenoa repens)) .... Once daily 10)  Bisoprolol-hydrochlorothiazide 10-6.25 Mg Tabs (Bisoprolol-hydrochlorothiazide) .... One by mouth daily 11)  Tizanidine Hcl 4 Mg Tabs (Tizanidine hcl) .... One by mouth ghs as needed muscle spazm 12)  Benicar 20 Mg Tabs (Olmesartan medoxomil) .... One by mouth daily 13)  Folbee Plus Cz 5 Mg Tabs (B-complex-c-biotin-minerals-fa) .... One by mouth daily  Hypertension Assessment/Plan:      The patient's hypertensive risk group is category C: Target organ damage and/or diabetes.  Today's blood pressure is 140/80.  His blood pressure goal is < 130/80.  Patient Instructions: 1)  keep apointment Prescriptions: FOLBEE PLUS CZ 5 MG TABS (B-COMPLEX-C-BIOTIN-MINERALS-FA) one by mouth daily  #30 x 11   Entered and Authorized by:   Ricard Dillon MD   Signed by:   Ricard Dillon MD on 10/05/2009   Method used:   Electronically to        [facility]  [phone]* (retail)       [address]       [place], [place]  [address]       Ph: [phone] or [phone]       Fax: [phone]   RxID:   [id number]

## 2010-06-29 NOTE — Assessment & Plan Note (Signed)
Summary: 1 MO ROV/MM   Vital Signs:  Patient profile:   70 year old male Height:      69 inches Weight:      197 pounds Temp:     98.2 degrees F oral Resp:     14 per minute BP sitting:   150 / 82  (left arm)  Vitals Entered By: Allyne Gee, LPN (February 14, 624THL 9:53 AM) CC: roa   CC:  roa.  History of Present Illness: HTN   Hypertension Follow-Up      This is a 70 year old man who presents for Hypertension follow-up.  AA repair.  The patient denies lightheadedness, urinary frequency, headaches, edema, impotence, rash, and fatigue.  The patient denies the following associated symptoms: chest pain, chest pressure, exercise intolerance, dyspnea, palpitations, syncope, leg edema, and pedal edema.  Compliance with medications (by patient report) has been near 100%.  The patient reports that dietary compliance has been good.  The patient reports exercising 3-4X per week.    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) 7)  Family History of Colon Ca 1st Degree Relative <60  (ICD-V16.0) 8)  Diabetes Mellitus, Type II  (ICD-250.00) 9)  Preventive Health Care  (ICD-V70.0) 10)  Hypertension  (ICD-401.9) 11)  Hyperlipidemia  (ICD-272.4)  Current Problems (verified): 1)  Actinic Keratosis, Ear, Left  (ICD-702.0) 2)  Unspecified Anemia  (ICD-285.9) 3)  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 Once Daily 2)  Melatonin 3 Mg  Tabs (Melatonin) .... Take 1 Tablet By Mouth At Bedtime 3)  Niacin 500 Mg  Tbcr (Niacin) .... Once Daily 4)  Baby Aspirin 81 Mg  Chew (Aspirin) .... Once Daily 5)  Vytorin 10-20 Mg  Tabs (Ezetimibe-Simvastatin) .... Once Daily 6)  Vitamin C 500 Mg  Chew (Ascorbic Acid) .... Once Daily 7)  Osteo Bi-Flex Regular Strength 250-200 Mg  Tabs (Glucosamine-Chondroitin) .... Once Daily 8)  Omega-3 1000 Mg  Caps (Omega-3 Fatty Acids) .... Once Daily 9)  Flomax 0.4 Mg  Cp24 (Tamsulosin Hcl) .... One By Mouth Q Ac Supper 10)  Saw Palmetto 1000 Mg  Caps (Saw Palmetto (Serenoa Repens)) .... Once Daily 11)  Bisoprolol-Hydrochlorothiazide 5-6.25 Mg Tabs (Bisoprolol-Hydrochlorothiazide) .... One By Mouth Daily 12)  Tizanidine Hcl 4 Mg Tabs (Tizanidine Hcl) .... One By Mouth Ghs As Needed Muscle Spazm  Current Medications (verified): 1)  Daily Combo Multi Vitamins   Tabs (Multiple Vitamins-Minerals) .... Once Daily 2)  Melatonin 3 Mg  Tabs (Melatonin) .... Take 1 Tablet By Mouth At Bedtime 3)  Niacin 500 Mg  Tbcr (Niacin) .... Once Daily 4)  Baby Aspirin 81 Mg  Chew (Aspirin) .... Once Daily 5)  Vytorin  10-20 Mg  Tabs (Ezetimibe-Simvastatin) .... Once Daily 6)  Vitamin C 500 Mg  Chew (Ascorbic Acid) .... Once Daily 7)  Omega-3 1000 Mg  Caps (Omega-3 Fatty Acids) .... 2 Once Daily 8)  Flomax 0.4 Mg  Cp24 (Tamsulosin Hcl) .... One By Mouth Q Ac Supper 9)  Saw Palmetto 1000 Mg  Caps (Saw Palmetto (Serenoa Repens)) .... Once Daily 10)  Bisoprolol-Hydrochlorothiazide 10-6.25 Mg Tabs (Bisoprolol-Hydrochlorothiazide) .... One By Mouth Daily 11)  Tizanidine Hcl 4 Mg Tabs (Tizanidine Hcl) .... One By Mouth Ghs As Needed Muscle Spazm  Allergies  (verified): No Known Drug Allergies  Past History:  Family History: Last updated: 02-28-07 father died with parkinsons in 71's mother Family History of Colon CA 1st degree relative <60  Social History: Last updated: Feb 28, 2007 Married Former Smoker  Risk Factors: Exercise: no (02/28/2007)  Risk Factors: Smoking Status: quit (07/13/2009)  Past medical, surgical, family and social histories (including risk factors) reviewed, and no changes noted (except as noted below).  Past Medical History: Reviewed history from 02/28/07 and no changes required. Diabetes mellitus, type II Hyperlipidemia Hypertension  Past Surgical History: Reviewed history from February 28, 2007 and no changes required. Carotid endarterectomy testicularcyst  Family History: Reviewed history from 28-Feb-2007 and no changes required. father died with parkinsons in 49's mother Family History of Colon CA 1st degree relative <60  Social History: Reviewed history from 2007/02/28 and no changes required. Married Former Smoker  Review of Systems  The patient denies anorexia, fever, weight loss, weight gain, vision loss, decreased hearing, hoarseness, chest pain, syncope, dyspnea on exertion, peripheral edema, prolonged cough, headaches, hemoptysis, abdominal pain, melena, hematochezia, severe indigestion/heartburn, hematuria, incontinence, genital sores, muscle weakness, suspicious skin lesions, transient blindness, difficulty walking, depression, unusual weight change, abnormal bleeding, enlarged lymph nodes, angioedema, and breast masses.    Physical Exam  General:  Well-developed,well-nourished,in no acute distress; alert,appropriate and cooperative throughout examination Head:  Normocephalic and atraumatic without obvious abnormalities. No apparent alopecia or balding. Eyes:  vision grossly intact, pupils equal, and pupils round.   Nose:  no external deformity and no nasal discharge.   Mouth:  pharynx  pink and moist and no erythema.   Neck:  supple.   Lungs:  normal respiratory effort and no wheezes.   Heart:  normal rate and regular rhythm.   Abdomen:  abdominal scar for AAA Msk:  decreased ROM and joint tenderness.   Extremities:  trace left pedal edema and trace right pedal edema.   Neurologic:  alert & oriented X3 and DTRs symmetrical and normal.     Impression & Recommendations:  Problem # 1:  UNSPECIFIED ANEMIA (ICD-285.9)  monitering iron and anemia post op  Hgb: 11.8 (06/08/2009)   Hct: 35.5 (06/08/2009)   Platelets: 158.0 (06/08/2009) RBC: 3.87 (06/08/2009)   RDW: 13.6 (06/08/2009)   WBC: 8.5 (06/08/2009) MCV: 91.8 (06/08/2009)   MCHC: 33.1 (06/08/2009) Iron: 106 (12/05/2008)   TIBC: 339 (12/05/2008)   % Sat: 31 (12/05/2008) B12: 546 (12/05/2008)   Folate: >20.0 ng/mL (12/05/2008)   TSH: 1.22 (11/20/2008)  Orders: TLB-CBC Platelet - w/Differential (85025-CBCD) TLB-IBC Pnl (Iron/FE;Transferrin) (83550-IBC) Venipuncture [id number])  Problem # 2:  HYPERTENSION (ICD-401.9)  His updated medication list for this problem includes:    Bisoprolol-hydrochlorothiazide 10-6.25 Mg Tabs (Bisoprolol-hydrochlorothiazide) ..... One by mouth daily  Orders: TLB-BMP (Basic Metabolic Panel-BMET) (99991111)  BP today: 150/82 Prior BP: 150/80 (06/08/2009)  Prior 10 Yr Risk Heart Disease: Not enough information (Feb 28, 2007)  Labs Reviewed: K+: 4.1 (06/08/2009) Creat: : 1.2 (06/08/2009)  Chol: 181 (11/20/2008)   HDL: 37.40 (11/20/2008)   LDL: DEL (02/11/2008)   TG: 251.0 (11/20/2008)  Problem # 3:  DIABETES MELLITUS, TYPE II (ICD-250.00)  His updated medication list for this problem includes:    Baby Aspirin 81 Mg Chew (Aspirin) ..... Once daily  Orders: TLB-A1C / Hgb A1C (Glycohemoglobin) (83036-A1C)  Labs Reviewed: Creat: 1.2 (06/08/2009)    Reviewed HgBA1c results: 5.7 (06/08/2009)  5.9 (12/05/2008)  Problem # 4:  Hx of ABDOMINAL AORTIC ANEURYSM REPAIR, HX OF  (ICD-V15.1) controll blood pressure  Complete Medication List: 1)  Daily Combo Multi Vitamins Tabs (Multiple vitamins-minerals) .... Once daily 2)  Melatonin 3 Mg Tabs (Melatonin) .... Take 1 tablet by mouth at bedtime 3)  Niacin 500 Mg Tbcr (Niacin) .... Once daily 4)  Baby Aspirin 81 Mg Chew (Aspirin) .... Once daily 5)  Vytorin 10-20 Mg Tabs (Ezetimibe-simvastatin) .... Once daily 6)  Vitamin C 500 Mg Chew (Ascorbic acid) .... Once daily 7)  Omega-3 1000 Mg Caps (Omega-3 fatty acids) .... 2 once daily 8)  Flomax 0.4 Mg Cp24 (Tamsulosin hcl) .... One by mouth q ac supper 9)  Saw Palmetto 1000 Mg Caps (Saw palmetto (serenoa repens)) .... Once daily 10)  Bisoprolol-hydrochlorothiazide 10-6.25 Mg Tabs (Bisoprolol-hydrochlorothiazide) .... One by mouth daily 11)  Tizanidine Hcl 4 Mg Tabs (Tizanidine hcl) .... One by mouth ghs as needed muscle spazm  Patient Instructions: 1)  Please schedule a follow-up appointment in 2 months. Prescriptions: BISOPROLOL-HYDROCHLOROTHIAZIDE 10-6.25 MG TABS (BISOPROLOL-HYDROCHLOROTHIAZIDE) one by mouth daily  #30 x 11   Entered and Authorized by:   Ricard Dillon MD   Signed by:   Ricard Dillon MD on 07/13/2009   Method used:   Electronically to        [facility]  [phone]* (retail)       [address]       [place], [place]  [address]       Ph: [phone] or [phone]       Fax: [phone]   RxID:   [id number]

## 2010-06-29 NOTE — Assessment & Plan Note (Signed)
Summary: 4 month rov/njr---PT [place] (BMP) // RS   Vital Signs:  Patient profile:   70 year old male Height:      69 inches Weight:      218 pounds BMI:     32.31 Temp:     98.2 degrees F oral Pulse rate:   72 / minute Resp:     14 per minute BP sitting:   152 / 84  (left arm)  Vitals Entered By: Allyne Gee, LPN (October  5, 624THL 10:18 AM)  Contraindications/Deferment of Procedures/Staging:    Test/Procedure: FLU VAX    Reason for deferment: patient declined  CC: roa labs- c/o left knee pain, Hypertension Management Is Patient Diabetic? No   Primary Care Vickye Astorino:  Ricard Dillon MD  CC:  roa labs- c/o left knee pain and Hypertension Management.  History of Present Illness: weight and lipids increased the pts has been very non compliant with diet the pt has increased risks DM and Lipids, age and fm hx His hp and right leg is better and he has been seen NSAIDS The pt has been follow by Dr fields for the AAA ( see prior documentations)    Hypertension History:      He denies headache, chest pain, palpitations, dyspnea with exertion, orthopnea, PND, peripheral edema, visual symptoms, neurologic problems, syncope, and side effects from treatment.  poor control.        Positive major cardiovascular risk factors include male age 72 years old or older, diabetes, hyperlipidemia, and hypertension.  Negative major cardiovascular risk factors include non-tobacco-user status.     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) 10)  Knee Pain, Right  (ICD-719.46) 11)  Urinary Obstruction Unspecified  (ICD-599.60) 12)  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) 13)  Family History of Colon Ca 1st Degree Relative <60  (ICD-V16.0) 14)  Diabetes Mellitus, Type II  (ICD-250.00) 15)  Preventive Health Care  (ICD-V70.0) 16)  Hypertension  (ICD-401.9) 17)  Hyperlipidemia  (ICD-272.4)  Current Problems (verified): 1)  Thoracic/lumbosacral Neuritis/radiculitis Unspec  (ICD-724.4) 2)  Thoracic/lumbosacral Neuritis/radiculitis Unspec  (ICD-724.4) 3)  Bursitis, Hip  (ICD-726.5) 4)  Morton's Neuroma, 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) 5)  Tremor, 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) 6)  Hx of Abdominal Aortic Aneurysm Repair, Hx of  (ICD-V15.1) 7)  Actinic Keratosis, Ear, Left  (ICD-702.0) 8)  Unspecified Anemia  (ICD-285.9) 9)  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 Once Daily 2)  Melatonin 3 Mg  Tabs (Melatonin) .... Take 1 Tablet By Mouth At Bedtime 3)  Niacin 500 Mg  Tbcr (Niacin) .... Once Daily 4)  Baby Aspirin 81 Mg  Chew (Aspirin) .... Once Daily 5)  Vytorin 10-20 Mg  Tabs (Ezetimibe-Simvastatin) .... Once Daily 6)  Vitamin C 500 Mg  Chew (Ascorbic Acid) .... Once Daily 7)  Omega-3 1000 Mg  Caps (Omega-3 Fatty Acids) .... 2 Once Daily 8)  Flomax 0.4 Mg  Cp24 (Tamsulosin Hcl) .... One By Mouth Q Ac Supper 9)  Saw Palmetto 1000 Mg  Caps (Saw Palmetto (Serenoa Repens)) .... Once  Daily 10)  Bisoprolol-Hydrochlorothiazide 10-6.25 Mg Tabs (Bisoprolol-Hydrochlorothiazide) .... One By Mouth Daily 11)  Tizanidine Hcl 4 Mg Tabs (Tizanidine Hcl) .... One By Mouth Ghs As Needed Muscle Spazm 12)  Benicar 20 Mg Tabs (Olmesartan Medoxomil) .... One By Mouth Daily 13)  Folbee Plus Cz 5 Mg Tabs (B-Complex-C-Biotin-Minerals-Fa) .... One By Mouth Daily 14)  Diclofenac Sodium 75 Mg Tbec (Diclofenac Sodium) .... One By Mouth Two Times A Day As Needed  Current Medications (verified): 1)  Daily Combo Multi Vitamins   Tabs (Multiple Vitamins-Minerals) .... Once Daily 2)  Melatonin 3 Mg  Tabs (Melatonin) .... Take 1 Tablet By Mouth At Bedtime 3)  Niacin 500 Mg  Tbcr (Niacin) .... Once Daily 4)  Baby Aspirin 81 Mg  Chew (Aspirin) .... Once Daily 5)  Vytorin 10-40 Mg Tabs (Ezetimibe-Simvastatin) .... One By Mouth Daily 6)  Vitamin C 500 Mg  Chew (Ascorbic Acid) .... Once Daily 7)  Omega-3 1000 Mg  Caps (Omega-3 Fatty Acids) .... 2 Once Daily 8)  Flomax 0.4 Mg  Cp24 (Tamsulosin Hcl) .... One By Mouth Q Ac Supper 9)  Saw Palmetto 1000 Mg  Caps (Saw Palmetto (Serenoa Repens)) .... Once Daily 10)  Bisoprolol-Hydrochlorothiazide 10-6.25 Mg Tabs (Bisoprolol-Hydrochlorothiazide) .... One By Mouth Daily 11)  Tizanidine Hcl 4 Mg Tabs (Tizanidine Hcl) .... One By Mouth Ghs As Needed Muscle Spazm 12)  Benicar 40 Mg Tabs (Olmesartan Medoxomil) .... One By Mouth Daily 13)  Folbee Plus Cz 5 Mg Tabs (B-Complex-C-Biotin-Minerals-Fa) .... One By Mouth Daily 14)  Diclofenac Sodium 75 Mg Tbec (Diclofenac Sodium) .... One By Mouth Two Times A Day As Needed  Allergies (verified): No Known Drug Allergies  Past History:  Family History: Last updated: February 08, 2007 father died with parkinsons in 80's mother Family History of Colon CA 1st degree relative <60  Social History: Last updated: 02-08-07 Married Former Smoker  Risk Factors: Exercise: no (2007/02/08)  Risk Factors: Smoking Status: quit  (03/03/2010)  Past medical, surgical, family and social histories (including risk factors) reviewed, and no changes noted (except as noted below).  Past Medical History: Reviewed history from 02/08/07 and no changes required. Diabetes mellitus, type II Hyperlipidemia Hypertension  Past Surgical History: Reviewed history from 02/08/2007 and no changes required. Carotid endarterectomy testicularcyst  Family History: Reviewed history from 02-08-07 and no changes required. father died with parkinsons in 37's mother Family History of Colon CA 1st degree relative <60  Social History: Reviewed history from 02-08-07 and no changes required. Married Former Smoker  Review of Systems  The patient denies anorexia, fever, weight loss, weight gain, vision loss, decreased hearing, hoarseness, chest pain, syncope, dyspnea on exertion, peripheral edema, prolonged cough, headaches, hemoptysis, abdominal pain, melena, hematochezia, severe indigestion/heartburn, hematuria, incontinence, genital sores, muscle weakness, suspicious skin lesions, transient blindness, difficulty walking, depression, unusual weight change, abnormal bleeding, enlarged lymph nodes, angioedema, breast masses, and testicular masses.    Physical Exam  General:  alert and overweight-appearing.   Head:  normocephalic.   Eyes:  pupils equal and pupils round.   Ears:  R ear normal and L ear normal.   Nose:  no external deformity and no nasal discharge.   Neck:  No deformities, masses, or tenderness noted. Lungs:  Normal respiratory effort, chest expands symmetrically. Lungs are clear to auscultation, no crackles or wheezes. Heart:  Normal rate and regular rhythm. S1 and S2 normal without gallop, murmur, click, rub or other extra sounds. Abdomen:  soft, non-tender, normal bowel sounds, and no masses.  scar from previous aneurysm repair Msk:  decreased ROM, joint tenderness, and joint swelling.    Diabetes Management  Exam:    Foot Exam (with socks and/or shoes not present):       Sensory-Pinprick/Light touch:          Left medial foot (L-4): diminished          Left dorsal foot (L-5): diminished          Left lateral foot (S-1): diminished          Right medial foot (L-4): diminished          Right dorsal foot (L-5): diminished          Right lateral foot (S-1): diminished   Impression & Recommendations:  Problem # 1:  DIABETES MELLITUS, TYPE II (ICD-250.00) gradual loss of controll and may have to add oral agent His updated medication list for this problem includes:    Baby Aspirin 81 Mg Chew (Aspirin) ..... Once daily    Benicar 40 Mg Tabs (Olmesartan medoxomil) ..... One by mouth daily  Labs Reviewed: Creat: 1.2 (09/07/2009)    Reviewed HgBA1c results: 6.4 (09/07/2009)  6.1 (07/13/2009)  Problem # 2:  HYPERTENSION (ICD-401.9) weight loss, diet control and inceas the benicar to 40, high risk due to hx of AAA His updated medication list for this problem includes:    Bisoprolol-hydrochlorothiazide 10-6.25 Mg Tabs (Bisoprolol-hydrochlorothiazide) ..... One by mouth daily    Benicar 40 Mg Tabs (Olmesartan medoxomil) ..... One by mouth daily  BP today: 152/84 Prior BP: 162/78 (12/23/2009)  Prior 10 Yr Risk Heart Disease: Not enough information (02/05/2007)  Labs Reviewed: K+: 4.5 (09/07/2009) Creat: : 1.2 (09/07/2009)   Chol: 239 (02/24/2010)   HDL: 38.00 (02/24/2010)   LDL: DEL (02/11/2008)   TG: 469.0 Triglyceride is over 400; calculations on Lipids are invalid. mg/dL (02/24/2010)  Problem # 3:  HYPERLIPIDEMIA (ICD-272.4) Assessment: Deteriorated incresd the vytorin, urder  weight loss and diet changes immediately'give hx of anurysm we stress the importance of control His updated medication list for this problem includes:    Niacin 500 Mg Tbcr (Niacin) ..... Once daily    Vytorin 10-40 Mg Tabs (Ezetimibe-simvastatin) ..... One by mouth daily  Labs Reviewed: SGOT: 30 (02/24/2010)    SGPT: 47 (02/24/2010)  Lipid Goals: Chol Goal: 200 (06/04/2007)   HDL Goal: 40 (06/04/2007)   LDL Goal: 100 (06/04/2007)   TG Goal: 150 (06/04/2007)  Prior 10 Yr Risk Heart Disease: Not enough information (02/05/2007)   HDL:38.00 (02/24/2010), 37.40 (11/20/2008)  LDL:DEL (02/11/2008), DEL (05/28/2007)  Chol:239 (02/24/2010), 181 (11/20/2008)  Trig:469.0 Triglyceride is over 400; calculations on Lipids are invalid. 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 After care discussed. Pt tolerated procedure well.  His updated  medication list for this problem includes:    Baby Aspirin 81 Mg Chew (Aspirin) ..... Once daily    Diclofenac Sodium 75 Mg Tbec (Diclofenac sodium) ..... One by mouth two times a day as needed  Discussed use of medications, application of heat or cold, and exercises.   Orders: Joint Aspirate / Injection, Large (20610) Depo- Medrol 40mg  (J1030)  Complete Medication List: 1)  Daily Combo Multi Vitamins Tabs (Multiple vitamins-minerals) .... Once daily 2)  Melatonin 3 Mg Tabs (Melatonin) .... Take 1 tablet by mouth at bedtime 3)  Niacin 500 Mg Tbcr (Niacin) .... Once daily 4)  Baby Aspirin 81 Mg Chew (Aspirin) .... Once daily 5)  Vytorin 10-40 Mg Tabs (Ezetimibe-simvastatin) .... One by mouth daily 6)  Vitamin C 500 Mg Chew (Ascorbic acid) .... Once daily 7)  Omega-3 1000 Mg Caps (Omega-3 fatty acids) .... 2 once daily 8)  Flomax 0.4 Mg Cp24 (Tamsulosin hcl) .... One by mouth q ac supper 9)  Saw Palmetto 1000 Mg Caps (Saw palmetto (serenoa repens)) .... Once daily 10)  Bisoprolol-hydrochlorothiazide 10-6.25 Mg Tabs (Bisoprolol-hydrochlorothiazide) .... One by mouth daily 11)  Tizanidine Hcl 4 Mg Tabs (Tizanidine hcl)  .... One by mouth ghs as needed muscle spazm 12)  Benicar 40 Mg Tabs (Olmesartan medoxomil) .... One by mouth daily 13)  Folbee Plus Cz 5 Mg Tabs (B-complex-c-biotin-minerals-fa) .... One by mouth daily 14)  Diclofenac Sodium 75 Mg Tbec (Diclofenac sodium) .... One by mouth two times a day as needed  Hypertension Assessment/Plan:      The patient's hypertensive risk group is category C: Target organ damage and/or diabetes.  Today's blood pressure is 152/84.  His blood pressure goal is < 130/80.  Patient Instructions: 1)  Hepatic Panel prior to visit, ICD-9:995.20 2)  Lipid Panel prior to visit, ICD-9:272.4 3)  HbgA1C prior to visit, ICD-9:250.00 4)  Please schedule a follow-up appointment in 3 months. Prescriptions: VYTORIN 10-40 MG TABS (EZETIMIBE-SIMVASTATIN) one by mouth daily  #30 x 11   Entered and Authorized by:   Ricard Dillon MD   Signed by:   Ricard Dillon MD on 03/03/2010   Method used:   Electronically to        [facility]  [phone]* (retail)       [address]       [place], [place]  [address]       Ph: [phone] or [phone]       Fax: [phone]   RxID:   [id number] BENICAR 40 MG TABS (OLMESARTAN MEDOXOMIL) one by mouth daily  #30 x 11   Entered and Authorized by:   Ricard Dillon MD   Signed by:   Ricard Dillon MD on 03/03/2010   Method used:   Electronically to        [facility]  [phone]* (retail)       [address]       [place], [place]  [address]       Ph: [phone] or [phone]       Fax: [phone]   RxID:   [id number]

## 2010-06-29 NOTE — Assessment & Plan Note (Signed)
Summary: 2 month rov/njr   Vital Signs:  Patient profile:   70 year old male Height:      69 inches Weight:      202 pounds BMI:     29.94 Temp:     98.1 degrees F oral Pulse rate:   76 / minute Resp:     14 per minute BP sitting:   152 / 72  (left arm)  Vitals Entered By: Allyne Gee, LPN (April 11, 624THL [id number] PM) CC: roa- had labs in feb- pt stopped azor, Hypertension Management   CC:  roa- had labs in feb- pt stopped azor and Hypertension Management.  History of Present Illness: the blood pressure has been increased the last two visits in the 150/80 has not been measureing this at home the pt has anemia and has been followed for iron therapy and levels needed today'the muscle relaxers help sleep he has been eating too many sweets   Hypertension History:      He denies headache, chest pain, palpitations, dyspnea with exertion, orthopnea, PND, peripheral edema, visual symptoms, neurologic problems, syncope, and side effects from treatment.        Positive major cardiovascular risk factors include male age 57 years old or older, diabetes, hyperlipidemia, and hypertension.  Negative major cardiovascular risk factors include non-tobacco-user status.     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) 8)  Family History of Colon Ca 1st Degree Relative <60  (ICD-V16.0) 9)  Diabetes Mellitus, Type II  (ICD-250.00) 10)  Preventive Health Care  (ICD-V70.0) 11)  Hypertension  (ICD-401.9) 12)  Hyperlipidemia  (ICD-272.4)  Current Problems (verified): 1)  Hx of  Abdominal Aortic Aneurysm Repair, Hx of  (ICD-V15.1) 2)  Actinic Keratosis, Ear, Left  (ICD-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 Once Daily 2)  Melatonin 3 Mg  Tabs (Melatonin) .... Take 1 Tablet By Mouth At Bedtime 3)  Niacin 500 Mg  Tbcr (Niacin) .... Once Daily 4)  Baby Aspirin 81 Mg  Chew (Aspirin) .... Once Daily 5)  Vytorin 10-20 Mg  Tabs (Ezetimibe-Simvastatin) .... Once Daily 6)  Vitamin C 500 Mg  Chew (Ascorbic Acid) .... Once Daily 7)  Omega-3 1000 Mg  Caps (Omega-3 Fatty Acids) .... 2 Once Daily 8)  Flomax 0.4 Mg  Cp24 (Tamsulosin Hcl) .... One By Mouth Q Ac Supper 9)  Saw Palmetto 1000 Mg  Caps (Saw Palmetto (Serenoa Repens)) .... Once Daily 10)  Bisoprolol-Hydrochlorothiazide 10-6.25 Mg Tabs (Bisoprolol-Hydrochlorothiazide) .... One By Mouth Daily 11)  Tizanidine Hcl 4 Mg Tabs (Tizanidine Hcl) .... One By  Mouth Ghs As Needed Muscle Spazm  Current Medications (verified): 1)  Daily Combo Multi Vitamins   Tabs (Multiple Vitamins-Minerals) .... Once Daily 2)  Melatonin 3 Mg  Tabs (Melatonin) .... Take 1 Tablet By Mouth At Bedtime 3)  Niacin 500 Mg  Tbcr (Niacin) .... Once Daily 4)  Baby Aspirin 81 Mg  Chew (Aspirin) .... Once Daily 5)  Vytorin 10-20 Mg  Tabs (Ezetimibe-Simvastatin) .... Once Daily 6)  Vitamin C 500 Mg  Chew (Ascorbic Acid) .... Once Daily 7)  Omega-3 1000 Mg  Caps (Omega-3 Fatty Acids) .... 2 Once Daily 8)  Flomax 0.4 Mg  Cp24 (Tamsulosin  Hcl) .... One By Mouth Q Ac Supper 9)  Saw Palmetto 1000 Mg  Caps (Saw Palmetto (Serenoa Repens)) .... Once Daily 10)  Bisoprolol-Hydrochlorothiazide 10-6.25 Mg Tabs (Bisoprolol-Hydrochlorothiazide) .... One By Mouth Daily 11)  Tizanidine Hcl 4 Mg Tabs (Tizanidine Hcl) .... One By Mouth Ghs As Needed Muscle Spazm  Allergies (verified): No Known Drug Allergies  Past History:  Family History: Last updated: 2007/03/06 father died with parkinsons in 12's mother Family History of Colon CA 1st degree relative <60  Social History: Last updated: 03/06/07 Married Former Smoker  Risk Factors: Exercise: no (2007/03/06)  Risk Factors: Smoking Status: quit (09/07/2009)  Past medical, surgical, family and social histories (including risk factors) reviewed, and no changes noted (except as noted below).  Past Medical History: Reviewed history from 06-Mar-2007 and no changes required. Diabetes mellitus, type II Hyperlipidemia Hypertension  Past Surgical History: Reviewed history from 03-06-07 and no changes required. Carotid endarterectomy testicularcyst  Family History: Reviewed history from 2007/03/06 and no changes required. father died with parkinsons in 70's mother Family History of Colon CA 1st degree relative <60  Social History: Reviewed history from March 06, 2007 and no changes required. Married Former Smoker  Review of Systems  The patient denies anorexia, fever, weight loss, weight gain, vision loss, decreased hearing, hoarseness, chest pain, syncope, dyspnea on exertion, peripheral edema, prolonged cough, headaches, hemoptysis, abdominal pain, melena, hematochezia, severe indigestion/heartburn, hematuria, incontinence, genital sores, muscle weakness, suspicious skin lesions, transient blindness, difficulty walking, depression, unusual weight change, abnormal bleeding, enlarged lymph nodes, angioedema, and breast masses.    Physical Exam  General:   Well-developed,well-nourished,in no acute distress; alert,appropriate and cooperative throughout examination Head:  Normocephalic and atraumatic without obvious abnormalities. No apparent alopecia or balding. Eyes:  vision grossly intact, pupils equal, and pupils round.   Nose:  no external deformity and no nasal discharge.   Mouth:  pharynx pink and moist and no erythema.   Neck:  supple.   Lungs:  normal respiratory effort and no wheezes.   Heart:  normal rate and regular rhythm.   Abdomen:  abdominal scar for AAA Msk:  decreased ROM and joint tenderness.   Pulses:  R and L carotid,radial,femoral,dorsalis pedis and posterior tibial pulses are full and equal bilaterally Extremities:  trace left pedal edema and trace right pedal edema.     Impression & Recommendations:  Problem # 1:  KNEE PAIN, left (ICD-719.46)  His updated medication list for this problem includes:    Baby Aspirin 81 Mg Chew (Aspirin) ..... Once daily    Tizanidine Hcl 4 Mg Tabs (Tizanidine hcl) ..... One by mouth ghs as needed muscle spazm  Discussed strengthening exercises, use of ice or heat, and medications.   Problem # 2:  UNSPECIFIED ANEMIA (ICD-285.9)  moniter the iron and the hgb today Hgb: 12.2 (07/13/2009)   Hct: 36.7 (07/13/2009)   Platelets:  152.0 (07/13/2009) RBC: 4.08 (07/13/2009)   RDW: 13.0 (07/13/2009)   WBC: 8.5 (07/13/2009) MCV: 89.8 (07/13/2009)   MCHC: 33.3 (07/13/2009) Iron: 51 (07/13/2009)   TIBC: 339 (12/05/2008)   % Sat: 17.7 (07/13/2009) B12: 546 (12/05/2008)   Folate: >20.0 ng/mL (12/05/2008)   TSH: 1.22 (11/20/2008)  Orders: TLB-IBC Pnl (Iron/FE;Transferrin) (83550-IBC) TLB-CBC Platelet - w/Differential (85025-CBCD)  Problem # 3:  DIABETES MELLITUS, TYPE II (ICD-250.00) Assessment: Unchanged  His updated medication list for this problem includes:    Baby Aspirin 81 Mg Chew (Aspirin) ..... Once daily    Benicar 20 Mg Tabs (Olmesartan medoxomil) ..... One by mouth  daily  Orders: TLB-A1C / Hgb A1C (Glycohemoglobin) (83036-A1C)  Labs Reviewed: Creat: 1.1 (07/13/2009)    Reviewed HgBA1c results: 6.1 (07/13/2009)  5.7 (06/08/2009)  Problem # 4:  HYPERLIPIDEMIA (ICD-272.4) Assessment: Unchanged  His updated medication list for this problem includes:    Niacin 500 Mg Tbcr (Niacin) ..... Once daily    Vytorin 10-20 Mg Tabs (Ezetimibe-simvastatin) ..... Once daily  Labs Reviewed: SGOT: 31 (11/20/2008)   SGPT: 43 (11/20/2008)  Lipid Goals: Chol Goal: 200 (06/04/2007)   HDL Goal: 40 (06/04/2007)   LDL Goal: 100 (06/04/2007)   TG Goal: 150 (06/04/2007)  Prior 10 Yr Risk Heart Disease: Not enough information (02/05/2007)   HDL:37.40 (11/20/2008), 34.1 (02/11/2008)  LDL:DEL (02/11/2008), DEL (05/28/2007)  Chol:181 (11/20/2008), 153 (02/11/2008)  Trig:251.0 (11/20/2008), 297 (02/11/2008)  Problem # 5:  HYPERTENSION (ICD-401.9) adding the benicar 20 for better control with hx of AAA repair His updated medication list for this problem includes:    Bisoprolol-hydrochlorothiazide 10-6.25 Mg Tabs (Bisoprolol-hydrochlorothiazide) ..... One by mouth daily    Benicar 20 Mg Tabs (Olmesartan medoxomil) ..... One by mouth daily  Orders: Venipuncture HR:875720) TLB-BMP (Basic Metabolic Panel-BMET) (99991111)  BP today: 152/72 Prior BP: 150/82 (07/13/2009)  Prior 10 Yr Risk Heart Disease: Not enough information (02/05/2007)  Labs Reviewed: K+: 4.5 (07/13/2009) Creat: : 1.1 (07/13/2009)   Chol: 181 (11/20/2008)   HDL: 37.40 (11/20/2008)   LDL: DEL (02/11/2008)   TG: 251.0 (11/20/2008)  Complete Medication List: 1)  Daily Combo Multi Vitamins Tabs (Multiple vitamins-minerals) .... Once daily 2)  Melatonin 3 Mg Tabs (Melatonin) .... Take 1 tablet by mouth at bedtime 3)  Niacin 500 Mg Tbcr (Niacin) .... Once daily 4)  Baby Aspirin 81 Mg Chew (Aspirin) .... Once daily 5)  Vytorin 10-20 Mg Tabs (Ezetimibe-simvastatin) .... Once daily 6)  Vitamin C 500 Mg  Chew (Ascorbic acid) .... Once daily 7)  Omega-3 1000 Mg Caps (Omega-3 fatty acids) .... 2 once daily 8)  Flomax 0.4 Mg Cp24 (Tamsulosin hcl) .... One by mouth q ac supper 9)  Saw Palmetto 1000 Mg Caps (Saw palmetto (serenoa repens)) .... Once daily 10)  Bisoprolol-hydrochlorothiazide 10-6.25 Mg Tabs (Bisoprolol-hydrochlorothiazide) .... One by mouth daily 11)  Tizanidine Hcl 4 Mg Tabs (Tizanidine hcl) .... One by mouth ghs as needed muscle spazm 12)  Benicar 20 Mg Tabs (Olmesartan medoxomil) .... One by mouth daily  Hypertension Assessment/Plan:      The patient's hypertensive risk group is category C: Target organ damage and/or diabetes.  Today's blood pressure is 152/72.  His blood pressure goal is < 130/80.  Patient Instructions: 1)  Please schedule a follow-up appointment in 2 months.

## 2010-06-29 NOTE — Assessment & Plan Note (Signed)
Summary: hip pain X 2 days/nn   Vital Signs:  Patient profile:   70 year old male Weight:      211 pounds Temp:     98.0 degrees F oral BP sitting:   180 / 90  (left arm) Cuff size:   large  Vitals Entered By: Clearnce Sorrel CMA (December 21, 2009 10:56 AM) CC: hip pain x 2 days, started in groin area, pain is really severve if walking ro much, src   History of Present Illness: Patient seen same day appointment right lateral hip pain which started 2 days ago. No injury. Initially seemed to have some pain in the groin now lateral hip. Worse with walking and ambulation. Described as deep achy quality. Moderate severity. Tried icy- hot topical with mild relief.  Patient denies any back pain or, lower extremity weakness, or numbness. Did not take his blood pressure medications this morning.    Current Medications (verified): 1)  Daily Combo Multi Vitamins   Tabs (Multiple Vitamins-Minerals) .... Once Daily 2)  Melatonin 3 Mg  Tabs (Melatonin) .... Take 1 Tablet By Mouth At Bedtime 3)  Niacin 500 Mg  Tbcr (Niacin) .... Once Daily 4)  Baby Aspirin 81 Mg  Chew (Aspirin) .... Once Daily 5)  Vytorin 10-20 Mg  Tabs (Ezetimibe-Simvastatin) .... Once Daily 6)  Vitamin C 500 Mg  Chew (Ascorbic Acid) .... Once Daily 7)  Omega-3 1000 Mg  Caps (Omega-3 Fatty Acids) .... 2 Once Daily 8)  Flomax 0.4 Mg  Cp24 (Tamsulosin Hcl) .... One By Mouth Q Ac Supper 9)  Saw Palmetto 1000 Mg  Caps (Saw Palmetto (Serenoa Repens)) .... Once Daily 10)  Bisoprolol-Hydrochlorothiazide 10-6.25 Mg Tabs (Bisoprolol-Hydrochlorothiazide) .... One By Mouth Daily 11)  Tizanidine Hcl 4 Mg Tabs (Tizanidine Hcl) .... One By Mouth Ghs As Needed Muscle Spazm 12)  Benicar 20 Mg Tabs (Olmesartan Medoxomil) .... One By Mouth Daily 13)  Folbee Plus Cz 5 Mg Tabs (B-Complex-C-Biotin-Minerals-Fa) .... One By Mouth Daily  Allergies (verified): No Known Drug Allergies  Past History:  Past Medical History: Last updated:  02/05/2007 Diabetes mellitus, type II Hyperlipidemia Hypertension PMH reviewed for relevance  Review of Systems  The patient denies anorexia, fever, weight loss, and difficulty walking.    Physical Exam  General:  Well-developed,well-nourished,in no acute distress; alert,appropriate and cooperative throughout examination Lungs:  Normal respiratory effort, chest expands symmetrically. Lungs are clear to auscultation, no crackles or wheezes. Heart:  normal rate and regular rhythm.   Abdomen:  soft, non-tender, normal bowel sounds, and no masses.  scar from previous aneurysm repair Extremities:  patient has full range of motion right hip. No pain with internal or external rotation. Tender over the greater trochanteric bursa. Straight leg raise is negative. No lower extremity edema. Good distal pulses.   Impression & Recommendations:  Problem # 1:  BURSITIS, HIP (ICD-726.5)  discussed risks and benefits of corticosteroid injection right lateral hip region. Patient consented. Prep skin with Betadine. Using 1 inch 25-gauge needle injected 40 mg of Medrol and 2 cc plain Xylocaine right lateral hip region over the bursa without difficulty.  Orders: Depo- Medrol 40mg  (J1030) Joint Aspirate / Injection, Large (20610)  Complete Medication List: 1)  Daily Combo Multi Vitamins Tabs (Multiple vitamins-minerals) .... Once daily 2)  Melatonin 3 Mg Tabs (Melatonin) .... Take 1 tablet by mouth at bedtime 3)  Niacin 500 Mg Tbcr (Niacin) .... Once daily 4)  Baby Aspirin 81 Mg Chew (Aspirin) .... Once daily 5)  Vytorin  10-20 Mg Tabs (Ezetimibe-simvastatin) .... Once daily 6)  Vitamin C 500 Mg Chew (Ascorbic acid) .... Once daily 7)  Omega-3 1000 Mg Caps (Omega-3 fatty acids) .... 2 once daily 8)  Flomax 0.4 Mg Cp24 (Tamsulosin hcl) .... One by mouth q ac supper 9)  Saw Palmetto 1000 Mg Caps (Saw palmetto (serenoa repens)) .... Once daily 10)  Bisoprolol-hydrochlorothiazide 10-6.25 Mg Tabs  (Bisoprolol-hydrochlorothiazide) .... One by mouth daily 11)  Tizanidine Hcl 4 Mg Tabs (Tizanidine hcl) .... One by mouth ghs as needed muscle spazm 12)  Benicar 20 Mg Tabs (Olmesartan medoxomil) .... One by mouth daily 13)  Folbee Plus Cz 5 Mg Tabs (B-complex-c-biotin-minerals-fa) .... One by mouth daily  Patient Instructions: 1)  Ice right lateral hip region 2-3 times daily 2)  followup with Dr. Arnoldo Morale if hip pain not improving over the next couple of weeks   Medication Administration  Injection # 1:    Medication: Depo- Medrol 40mg     Diagnosis: BURSITIS, HIP (ICD-726.5)    Route: IM    Site: RUOQ gluteus    Comments: given w/ 2 cc Xylocanine    Patient tolerated injection without complications  Orders Added: 1)  Depo- Medrol 40mg  [J1030] 2)  Joint Aspirate / Injection, Large [20610]

## 2010-06-29 NOTE — Assessment & Plan Note (Signed)
Summary: 2 month fup//ccm   Vital Signs:  Patient profile:   70 year old male Height:      69 inches Weight:      208 pounds BMI:     30.83 Temp:     98.2 degrees F oral Pulse rate:   72 / minute Resp:     14 per minute BP sitting:   130 / 70  (left arm)  Vitals Entered By: Allyne Gee, LPN (June  6, 624THL 075-GRM PM) CC: roa, Hypertension Management   CC:  roa and Hypertension Management.  History of Present Illness: ESSENTIAL TREMORS IN THE HANDS The pt has periferal neuropaty in the right foot on the dorsum  gangion ( mortinsons neuroma)  vs neuropathy HTN stable hyperlipidemia stable  Hypertension History:      He denies headache, chest pain, palpitations, dyspnea with exertion, orthopnea, PND, peripheral edema, visual symptoms, neurologic problems, syncope, and side effects from treatment.        Positive major cardiovascular risk factors include male age 25 years old or older, diabetes, hyperlipidemia, and hypertension.  Negative major cardiovascular risk factors include non-tobacco-user status.     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) 9)  Family History of Colon Ca 1st Degree Relative <60  (ICD-V16.0) 10)  Diabetes Mellitus, Type II  (ICD-250.00) 11)  Preventive Health Care  (ICD-V70.0) 12)  Hypertension  (ICD-401.9) 13)  Hyperlipidemia  (ICD-272.4)  Current Problems (verified): 1)  Tremor, 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) 2)  Hx of Abdominal Aortic Aneurysm Repair, Hx of  (ICD-V15.1) 3)  Actinic Keratosis, Ear,  Left  (ICD-702.0) 4)  Unspecified Anemia  (ICD-285.9) 5)  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 Once Daily 2)  Melatonin 3 Mg  Tabs (Melatonin) .... Take 1 Tablet By Mouth At Bedtime 3)  Niacin 500 Mg  Tbcr (Niacin) .... Once Daily 4)  Baby Aspirin 81 Mg  Chew (Aspirin) .... Once Daily 5)  Vytorin 10-20 Mg  Tabs (Ezetimibe-Simvastatin) .... Once Daily 6)  Vitamin C 500 Mg  Chew (Ascorbic Acid) .... Once Daily 7)  Omega-3 1000 Mg  Caps (Omega-3 Fatty Acids) .... 2 Once Daily 8)  Flomax 0.4 Mg  Cp24 (Tamsulosin Hcl) .... One By Mouth Q Ac Supper 9)  Saw Palmetto 1000 Mg  Caps (Saw Palmetto (Serenoa Repens)) .... Once Daily 10)  Bisoprolol-Hydrochlorothiazide 10-6.25 Mg Tabs (Bisoprolol-Hydrochlorothiazide) .... One By Mouth Daily 11)  Tizanidine Hcl 4 Mg Tabs (Tizanidine Hcl) .... One By Mouth Ghs As Needed Muscle Spazm 12)  Benicar 20 Mg Tabs (Olmesartan Medoxomil) .... One By Mouth Daily  13)  Folbee Plus Cz 5 Mg Tabs (B-Complex-C-Biotin-Minerals-Fa) .... One By Mouth Daily  Current Medications (verified): 1)  Daily Combo Multi Vitamins   Tabs (Multiple Vitamins-Minerals) .... Once Daily 2)  Melatonin 3 Mg  Tabs (Melatonin) .... Take 1 Tablet By Mouth At Bedtime 3)  Niacin 500 Mg  Tbcr (Niacin) .... Once Daily 4)  Baby Aspirin 81 Mg  Chew (Aspirin) .... Once Daily 5)  Vytorin 10-20 Mg  Tabs (Ezetimibe-Simvastatin) .... Once Daily 6)  Vitamin C 500 Mg  Chew (Ascorbic Acid) .... Once Daily 7)  Omega-3 1000 Mg  Caps  (Omega-3 Fatty Acids) .... 2 Once Daily 8)  Flomax 0.4 Mg  Cp24 (Tamsulosin Hcl) .... One By Mouth Q Ac Supper 9)  Saw Palmetto 1000 Mg  Caps (Saw Palmetto (Serenoa Repens)) .... Once Daily 10)  Bisoprolol-Hydrochlorothiazide 10-6.25 Mg Tabs (Bisoprolol-Hydrochlorothiazide) .... One By Mouth Daily 11)  Tizanidine Hcl 4 Mg Tabs (Tizanidine Hcl) .... One By Mouth Ghs As Needed Muscle Spazm 12)  Benicar 20 Mg Tabs (Olmesartan Medoxomil) .... One By Mouth Daily 13)  Folbee Plus Cz 5 Mg Tabs (B-Complex-C-Biotin-Minerals-Fa) .... One By Mouth Daily  Allergies (verified): No Known Drug Allergies  Past History:  Family History: Last updated: 02-24-2007 father died with parkinsons in 74's mother Family History of Colon CA 1st degree relative <60  Social History: Last updated: Feb 24, 2007 Married Former Smoker  Risk Factors: Exercise: no (24-Feb-2007)  Risk Factors: Smoking Status: quit (11/02/2009)  Past medical, surgical, family and social histories (including risk factors) reviewed, and no changes noted (except as noted below).  Past Medical History: Reviewed history from 2007-02-24 and no changes required. Diabetes mellitus, type II Hyperlipidemia Hypertension  Past Surgical History: Reviewed history from 24-Feb-2007 and no changes required. Carotid endarterectomy testicularcyst  Family History: Reviewed history from 02/24/07 and no changes required. father died with parkinsons in 65's mother Family History of Colon CA 1st degree relative <60  Social History: Reviewed history from February 24, 2007 and no changes required. Married Former Smoker  Review of Systems  The patient denies anorexia, fever, weight loss, weight gain, vision loss, decreased hearing, hoarseness, chest pain, syncope, dyspnea on exertion, peripheral edema, prolonged cough, headaches, hemoptysis, abdominal pain, melena, hematochezia, severe indigestion/heartburn, hematuria, incontinence, genital sores,  muscle weakness, suspicious skin lesions, transient blindness, difficulty walking, depression, unusual weight change, abnormal bleeding, enlarged lymph nodes, angioedema, and breast masses.    Physical Exam  General:  Well-developed,well-nourished,in no acute distress; alert,appropriate and cooperative throughout examination Head:  normocephalic and atraumatic.   Eyes:  vision grossly intact, pupils equal, and pupils round.   Ears:  R ear normal and L ear normal.   Neck:  supple.   Lungs:  normal respiratory effort and no wheezes.   Heart:  normal rate and regular rhythm.   Abdomen:  abdominal scar for AAA Msk:  decreased ROM and joint tenderness.   Pulses:  R and L carotid,radial,femoral,dorsalis pedis and posterior tibial pulses are full and equal bilaterally Extremities:  trace right pedal edema.     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 After care discussed. Pt tolerated procedure well.  Orders: No Charge Patient Arrived (NCPA0) (NCPA0) Trigger Point Injection Single Tendon Origin/Insertion [phone]) Depo-Medrol 20mg  (J1020)  Complete Medication List: 1)  Daily Combo Multi Vitamins Tabs (Multiple  vitamins-minerals) .... Once daily 2)  Melatonin 3 Mg Tabs (Melatonin) .... Take 1 tablet by mouth at bedtime 3)  Niacin 500 Mg Tbcr (Niacin) .... Once daily 4)  Baby Aspirin 81 Mg Chew (Aspirin) .... Once daily 5)  Vytorin 10-20 Mg Tabs (Ezetimibe-simvastatin) .... Once daily 6)  Vitamin C 500 Mg Chew (Ascorbic acid) .... Once daily 7)  Omega-3 1000 Mg Caps (Omega-3 fatty acids) .... 2 once daily 8)  Flomax 0.4 Mg Cp24 (Tamsulosin hcl) .... One by mouth q ac supper 9)  Saw Palmetto 1000 Mg Caps (Saw palmetto (serenoa repens)) .... Once daily 10)  Bisoprolol-hydrochlorothiazide 10-6.25 Mg  Tabs (Bisoprolol-hydrochlorothiazide) .... One by mouth daily 11)  Tizanidine Hcl 4 Mg Tabs (Tizanidine hcl) .... One by mouth ghs as needed muscle spazm 12)  Benicar 20 Mg Tabs (Olmesartan medoxomil) .... One by mouth daily 13)  Folbee Plus Cz 5 Mg Tabs (B-complex-c-biotin-minerals-fa) .... One by mouth daily  Hypertension Assessment/Plan:      The patient's hypertensive risk group is category C: Target organ damage and/or diabetes.  Today's blood pressure is 130/70.  His blood pressure goal is < 130/80.  Patient Instructions: 1)  Please schedule a follow-up appointment in 4 months. 2)  Hepatic Panel prior to visit, ICD-9:995.20 3)  Lipid Panel prior to visit, ICD-9:272.4 Prescriptions: TIZANIDINE HCL 4 MG TABS (TIZANIDINE HCL) one by mouth Ghs as needed MUSCLE SPAZM  #30 x 4   Entered and Authorized by:   Ricard Dillon MD   Signed by:   Ricard Dillon MD on 11/02/2009   Method used:   Electronically to        [facility]  [phone]* (retail)       [address]       [place],   [address]       Ph: [phone] or [phone]       Fax: [phone]   RxID:   [id number]

## 2010-06-29 NOTE — Progress Notes (Signed)
Summary: Pending MRI, can pt work?  Phone Note Call from Patient Call back at Home Phone [phone]   Caller: Patient Call For: Ricard Dillon MD/Burchette Summary of Call: Pt called back after receiving information about his X-ray and need for MRI.  Pt remembers Dr Elease Hashimoto telling him "don't do anything until we find out what's going on".  Pt does manual labor, climbs ladders, stooping, lifting, etc. Initial call taken by: Nira Conn LPN,  July 28, 624THL 075-GRM PM  Follow-up for Phone Call        Based on discussion we had yesterday I don't think he can do his regular job (requires alot of climbing up and down ladders). Pt has signif quadriceps weakness.  He needs to avoid any lifting or climbing until further evaluated. Follow-up by: Carolann Littler MD,  December 24, 2009 4:01 PM  Additional Follow-up for Phone Call Additional follow up Details #1::        Patient aware.  Copy at front for him to give to work for now.   Additional Follow-up by: Shelbie Hutching, RN,  December 25, 2009 2:03 PM

## 2010-06-29 NOTE — Assessment & Plan Note (Signed)
Summary: leg weakness/dm   Vital Signs:  Patient profile:   70 year old male Temp:     98.0 degrees F oral BP sitting:   162 / 78  (left arm) Cuff size:   large  Vitals Entered By: Nira Conn LPN (July 27, [id number] [id number] PM) CC: right leg weakness Is Patient Diabetic? Yes Did you bring your meter with you today? No   History of Present Illness: Same-day appointment.  Patient seen with possible weakness right lower extremity.  Refer to prior note.   Onset last weekend of some pain which was poorly localized right groin and partly right buttock area. When seen here 2 days ago he had significant tenderness over the bursa in the hip and has received some improvement following steroid injection. His job requires going up and down ladders frequently and he noticed some weakness with climbing ladder yesterday. He has achy pain which is poorly localized anterior thighs and some toward the buttock. No numbness. No known injury. No loss of urine or stool continence. Symptoms worse with walking. Occasionally 10 out of 10 with walking.  Occasionally alleviated by shifting positions when sitting in chair. Has not seen any color changes lower extremities or coolness to touch of the right foot.  Allergies (verified): No Known Drug Allergies  Past History:  Past Medical History: Last updated: 02/05/2007 Diabetes mellitus, type II Hyperlipidemia Hypertension PMH reviewed for relevance  Review of Systems       The patient complains of muscle weakness.  The patient denies anorexia, fever, weight loss, chest pain, peripheral edema, abdominal pain, melena, hematochezia, and incontinence.    Physical Exam  General:  Well-developed,well-nourished,in no acute distress; alert,appropriate and cooperative throughout examination Mouth:  Oral mucosa and oropharynx without lesions or exudates.  Teeth in good repair. Neck:  No deformities, masses, or tenderness noted. Lungs:  Normal respiratory effort,  chest expands symmetrically. Lungs are clear to auscultation, no crackles or wheezes. Heart:  Normal rate and regular rhythm. S1 and S2 normal without gallop, murmur, click, rub or other extra sounds. Msk:  minimal right lower lumbar tenderness Extremities:  no lower extremity edema. Distal foot pulses are normal. Foot is warm to touch with good capillary refill. Neurologic:  full strength with plantar flexion and dorsiflexion bilaterally. 2+ reflexes knee and ankle bilaterally. Full strength hip flexion bilaterally. Minimal decrease strength right knee extension compared with left  sensation intact to light touch.     Impression & Recommendations:  Problem # 1:  THORACIC/LUMBOSACRAL NEURITIS/RADICULITIS UNSPEC (ICD-724.4) pt has rather acute onset of RLE quadricep weakness with poorly localized pain.  Check L-S films.  No evidence for signif PAD. His updated medication list for this problem includes:    Baby Aspirin 81 Mg Chew (Aspirin) ..... Once daily    Tizanidine Hcl 4 Mg Tabs (Tizanidine hcl) ..... One by mouth ghs as needed muscle spazm  Orders: T-Lumbar Spine Complete, 5 Views ([id number])  Problem # 2:  BURSITIS, HIP (ICD-726.5) Assessment: Improved  Complete Medication List: 1)  Daily Combo Multi Vitamins Tabs (Multiple vitamins-minerals) .... Once daily 2)  Melatonin 3 Mg Tabs (Melatonin) .... Take 1 tablet by mouth at bedtime 3)  Niacin 500 Mg Tbcr (Niacin) .... Once daily 4)  Baby Aspirin 81 Mg Chew (Aspirin) .... Once daily 5)  Vytorin 10-20 Mg Tabs (Ezetimibe-simvastatin) .... Once daily 6)  Vitamin C 500 Mg Chew (Ascorbic acid) .... Once daily 7)  Omega-3 1000 Mg Caps (Omega-3 fatty acids) .... 2 once  daily 8)  Flomax 0.4 Mg Cp24 (Tamsulosin hcl) .... One by mouth q ac supper 9)  Saw Palmetto 1000 Mg Caps (Saw palmetto (serenoa repens)) .... Once daily 10)  Bisoprolol-hydrochlorothiazide 10-6.25 Mg Tabs (Bisoprolol-hydrochlorothiazide) .... One by mouth daily 11)   Tizanidine Hcl 4 Mg Tabs (Tizanidine hcl) .... One by mouth ghs as needed muscle spazm 12)  Benicar 20 Mg Tabs (Olmesartan medoxomil) .... One by mouth daily 13)  Folbee Plus Cz 5 Mg Tabs (B-complex-c-biotin-minerals-fa) .... One by mouth daily  Patient Instructions: 1)  Follow up immediately if you have any loss of bladder or bowel control or any rapidly progressive weakness

## 2010-07-01 NOTE — Assessment & Plan Note (Signed)
Summary: 3 month fup//ccmpt rsc/njr   Vital Signs:  Patient profile:   70 year old male Height:      69 inches Weight:      216 pounds BMI:     32.01 Temp:     98.2 degrees F oral Pulse rate:   72 / minute Resp:     14 per minute BP sitting:   132 / 80  (left arm)  Vitals Entered By: Allyne Gee, LPN (January 25, [id number] 9:06 AM) CC: roa labs- increased vytorin  to10-40 last ov and added benicar 40, Hypertension Management, Lipid Management Is Patient Diabetic? No   Primary Care Provider:  Ricard Dillon MD  CC:  roa labs- increased vytorin  to10-40 last ov and added benicar 99, Hypertension Management, and Lipid Management.  History of Present Illness:  Kevin [name] is a 70 year old white male who presents for followup of hypertension hyperlipidemia and diabetes. he had laboratory values drawn prior to his office visit laboratory values are being reviewed with the patient and they reveal a total cholesterol of 169 and 8 HDL of 33 and an LDL C  of 57 which is at goal.  His  liver functions were normal. his hemoglobin A1c was 6.3 and his triglyceride level is elevated at 317 indicating that there is room for better control of his diabetes and less carbohydrates in his diet reviewed refills   Hypertension History:      He complains of side effects from treatment, but denies headache, chest pain, palpitations, dyspnea with exertion, orthopnea, PND, peripheral edema, visual symptoms, neurologic problems, and syncope.  possible cough.        Positive major cardiovascular risk factors include male age 72 years old or older, diabetes, hyperlipidemia, and hypertension.  Negative major cardiovascular risk factors include non-tobacco-user status.    Lipid Management History:      Positive NCEP/ATP III risk factors include male age 72 years old or older, diabetes, HDL cholesterol less than 40, and hypertension.  Negative NCEP/ATP III risk factors include non-tobacco-user status.       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) 10)  Knee Pain, Right  (ICD-719.46) 11)  Urinary Obstruction Unspecified  (ICD-599.60) 12)  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) 13)  Family History of Colon Ca 1st Degree Relative <60  (ICD-V16.0) 14)  Diabetes Mellitus, Type II  (ICD-250.00) 15)  Preventive Health Care  (ICD-V70.0) 16)  Hypertension  (ICD-401.9) 17)  Hyperlipidemia  (ICD-272.4)  Current Problems (verified): 1)  Thoracic/lumbosacral Neuritis/radiculitis Unspec  (ICD-724.4) 2)  Thoracic/lumbosacral Neuritis/radiculitis Unspec  (ICD-724.4) 3)  Bursitis, Hip  (ICD-726.5) 4)  Morton's Neuroma, 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) 5)  Tremor, 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) 6)  Hx of Abdominal Aortic Aneurysm Repair, Hx of  (ICD-V15.1) 7)  Actinic Keratosis, Ear, Left  (ICD-702.0) 8)  Unspecified Anemia  (ICD-285.9) 9)  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 Once Daily 2)  Melatonin 3 Mg  Tabs (Melatonin) .... Take 1 Tablet By Mouth At Bedtime 3)  Niacin 500 Mg  Tbcr (Niacin) .... Once Daily 4)  Baby Aspirin 81 Mg  Chew (Aspirin) .... Once Daily 5)  Vytorin 10-40 Mg Tabs (Ezetimibe-Simvastatin) .... One By Mouth Daily 6)  Vitamin C 500 Mg  Chew (Ascorbic Acid) .... Once Daily 7)  Omega-3 1000 Mg  Caps (Omega-3 Fatty Acids) .... 2 Once Daily 8)  Flomax 0.4 Mg  Cp24 (Tamsulosin Hcl) .... One By Mouth Q Ac Supper 9)  Saw Palmetto 1000 Mg  Caps (Saw Palmetto (Serenoa Repens)) .... Once Daily 10)  Bisoprolol-Hydrochlorothiazide 10-6.25 Mg Tabs (Bisoprolol-Hydrochlorothiazide) .... One By Mouth Daily 11)  Tizanidine Hcl 4 Mg Tabs (Tizanidine Hcl) .... One By Mouth Ghs As Needed Muscle Spazm 12)  Benicar 40 Mg Tabs (Olmesartan Medoxomil) .... One By Mouth Daily 13)  Folbee Plus Cz 5 Mg Tabs (B-Complex-C-Biotin-Minerals-Fa) .... One By Mouth Daily 14)  Diclofenac Sodium 75 Mg Tbec (Diclofenac Sodium) .... One By Mouth Two Times A Day As Needed  Current Medications (verified): 1)  Daily Combo Multi Vitamins   Tabs (Multiple Vitamins-Minerals) .... Once Daily 2)  Melatonin 3 Mg  Tabs (Melatonin) .... Take 1 Tablet By Mouth At Bedtime 3)  Niacin 500 Mg  Tbcr (Niacin) .... Once Daily 4)  Baby Aspirin 81 Mg  Chew (Aspirin) .... Once Daily 5)  Vytorin 10-40 Mg Tabs (Ezetimibe-Simvastatin) .... One By Mouth Daily 6)  Vitamin C 500 Mg  Chew (Ascorbic Acid) .... Once Daily 7)  Omega-3 1000 Mg  Caps (Omega-3 Fatty Acids) .... 2 Once Daily 8)  Flomax 0.4 Mg  Cp24 (Tamsulosin Hcl) .... One By Mouth Q Ac Supper 9)  Saw Palmetto 1000 Mg  Caps (Saw Palmetto (Serenoa Repens)) .... Once Daily 10)  Bisoprolol-Hydrochlorothiazide 10-6.25 Mg Tabs (Bisoprolol-Hydrochlorothiazide) .... One By Mouth Daily 11)  Tizanidine Hcl 4 Mg Tabs (Tizanidine Hcl) .... One By Mouth Ghs As Needed Muscle Spazm 12)  Benicar 40 Mg Tabs (Olmesartan Medoxomil) .... One  By Mouth Daily 13)  Folbee Plus Cz 5 Mg Tabs (B-Complex-C-Biotin-Minerals-Fa) .... One By Mouth Daily 14)  Diclofenac Sodium 75 Mg Tbec (Diclofenac Sodium) .... One By Mouth Two Times A Day As Needed  Allergies (verified): No Known Drug Allergies  Past History:  Family History: Last updated: 02-25-2007 father died with parkinsons in 54's mother Family History of Colon CA 1st degree relative <60  Social History: Last updated: 02-25-2007 Married Former Smoker  Risk Factors: Exercise: no (02/25/07)  Risk Factors: Smoking Status: quit (06/23/2010)  Past medical, surgical, family and social histories (including risk factors) reviewed, and no changes noted (except as noted below).  Past Medical History: Reviewed history from 25-Feb-2007 and no changes required. Diabetes mellitus, type II Hyperlipidemia Hypertension  Past Surgical History: Reviewed history from 25-Feb-2007 and no changes required. Carotid endarterectomy testicularcyst  Family History: Reviewed history from 2007/02/25 and no changes required. father died with parkinsons in 46's mother Family History of Colon CA 1st degree relative <60  Social History: Reviewed history from  02/05/2007 and no changes required. Married Former Smoker  Review of Systems       The patient complains of weight loss.  The patient denies anorexia, fever, weight gain, vision loss, decreased hearing, hoarseness, chest pain, syncope, dyspnea on exertion, peripheral edema, prolonged cough, headaches, hemoptysis, abdominal pain, melena, hematochezia, severe indigestion/heartburn, hematuria, incontinence, genital sores, muscle weakness, suspicious skin lesions, transient blindness, difficulty walking, depression, unusual weight change, abnormal bleeding, enlarged lymph nodes, angioedema, breast masses, and testicular masses.    Physical Exam  General:  alert and overweight-appearing.   Head:  normocephalic.   Eyes:  pupils equal and  pupils round.   Ears:  R ear normal and L ear normal.   Mouth:  Oral mucosa and oropharynx without lesions or exudates.  Teeth in good repair. Neck:  No deformities, masses, or tenderness noted. Lungs:  Normal respiratory effort, chest expands symmetrically. Lungs are clear to auscultation, no crackles or wheezes. Heart:  Normal rate and regular rhythm. S1 and S2 normal without gallop, murmur, click, rub or other extra sounds. Abdomen:  soft, non-tender, normal bowel sounds, and no masses.  scar from previous aneurysm repair Msk:  decreased ROM, joint tenderness, and joint swelling.   Extremities:  no lower extremity edema. Distal foot pulses are normal. Foot is warm to touch with good capillary refill. Neurologic:  full strength with plantar flexion and dorsiflexion bilaterally. 2+ reflexes knee and ankle bilaterally. Full strength hip flexion bilaterally. Minimal decrease strength right knee extension compared with left  sensation intact to light touch.    Diabetes Management Exam:    Foot Exam (with socks and/or shoes not present):       Sensory-Pinprick/Light touch:          Left medial foot (L-4): diminished          Left dorsal foot (L-5): diminished          Left lateral foot (S-1): diminished          Right medial foot (L-4): diminished          Right dorsal foot (L-5): diminished          Right lateral foot (S-1): diminished    Eye Exam:       Eye Exam done elsewhere          Date: 03/02/2010          Results: normal          Done by: scott   Impression & Recommendations:  Problem # 1:  [place] (ICD-V70.0) Assessment Unchanged  Colonoscopy: abnormal (02/06/2006) Td Booster: Td (02/05/2007)   Flu Vax: Historical (05/30/1997)   Pneumovax: refuses (12/05/2008) Chol: 169 (06/16/2010)   HDL: 33.00 (06/16/2010)   LDL: DEL (02/11/2008)   TG: 317.0 (06/16/2010) TSH: 1.22 (11/20/2008)   HgbA1C: 6.3 (06/16/2010)   PSA: 1.31 (11/20/2008) Next Colonoscopy due::  01/2011 (12/05/2008)  Discussed using sunscreen, use of alcohol, drug use, self testicular exam, routine dental care, routine eye care, routine physical exam, seat belts, multiple vitamins, osteoporosis prevention, adequate calcium intake in diet, and recommendations for immunizations.  Discussed exercise and checking cholesterol.  Discussed gun safety, safe sex, and contraception. Also recommend checking PSA.  Problem # 2:  DIABETES MELLITUS, TYPE II (ICD-250.00)  His updated medication list for this problem includes:    Baby Aspirin 81 Mg Chew (Aspirin) ..... Once daily    Benicar 40 Mg Tabs (Olmesartan medoxomil) ..... 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 Once daily    Diclofenac Sodium 75 Mg Tbec (Diclofenac sodium) ..... One by mouth two times a day as needed  Problem # 5:  DIABETES MELLITUS, TYPE II (ICD-250.00) weight loss and diet is the intervention His updated medication list for this problem includes:    Baby Aspirin 81 Mg Chew (Aspirin) ..... Once daily    Benicar 40 Mg Tabs (Olmesartan medoxomil) ..... One by mouth daily  Labs Reviewed: Creat: 1.2 (09/07/2009)     Last Eye Exam: normal (03/02/2010) Reviewed HgBA1c results: 6.3 (06/16/2010)  6.4 (09/07/2009)  Complete Medication List: 1)  Daily Combo Multi Vitamins Tabs (Multiple vitamins-minerals) .... Once daily 2)  Melatonin 3 Mg Tabs (Melatonin) .... Take 1 tablet by mouth at bedtime 3)  Niacin 500 Mg Tbcr (Niacin) .... Once daily 4)  Baby Aspirin 81 Mg Chew (Aspirin) .... Once daily 5)  Vytorin 10-40 Mg Tabs (Ezetimibe-simvastatin) .... One  by mouth daily 6)  Vitamin C 500 Mg Chew (Ascorbic acid) .... Once daily 7)  Omega-3 1000 Mg Caps (Omega-3 fatty acids) .... 2 once daily 8)  Flomax 0.4 Mg Cp24 (Tamsulosin hcl) .... One by mouth q ac supper 9)  Saw Palmetto 1000 Mg Caps (Saw palmetto (serenoa repens)) .... Once daily 10)  Bisoprolol-hydrochlorothiazide 10-6.25 Mg Tabs (Bisoprolol-hydrochlorothiazide) .... One by mouth daily 11)  Tizanidine Hcl 4 Mg Tabs (Tizanidine hcl) .... One by mouth ghs as needed muscle spazm 12)  Benicar 40 Mg Tabs (Olmesartan medoxomil) .... One by mouth daily 13)  Folbee Plus Cz 5 Mg Tabs (B-complex-c-biotin-minerals-fa) .... One by mouth daily 14)  Diclofenac Sodium 75 Mg Tbec (Diclofenac sodium) .... One by mouth two times a day as needed  Hypertension Assessment/Plan:      The patient's hypertensive risk group is category C: Target organ damage and/or diabetes.  Today's blood pressure is 132/80.  His blood pressure goal is < 130/80.  Lipid Assessment/Plan:      Based on NCEP/ATP III, the patient's risk factor category is "history of diabetes".  The patient's lipid goals are as follows: Total cholesterol goal is 200; LDL cholesterol goal is 100; HDL cholesterol goal is 40; Triglyceride goal is 150.  His LDL cholesterol goal has been met.    Patient Instructions: 1)  Please schedule a follow-up appointment in 4-5 months for CPX 2)  weigjht loss goal of 200 for both DM and hypertruglycerides   Orders Added: 1)  Est. Patient Level IV [id number]

## 2010-07-14 ENCOUNTER — Other Ambulatory Visit: Payer: Self-pay | Admitting: Family Medicine

## 2010-07-15 ENCOUNTER — Other Ambulatory Visit: Payer: Self-pay | Admitting: *Deleted

## 2010-07-18 ENCOUNTER — Other Ambulatory Visit: Payer: Self-pay | Admitting: Internal Medicine

## 2010-07-18 DIAGNOSIS — I10 Essential (primary) hypertension: Secondary | ICD-10-CM

## 2010-08-04 ENCOUNTER — Encounter: Payer: Self-pay | Admitting: Internal Medicine

## 2010-08-04 ENCOUNTER — Ambulatory Visit (INDEPENDENT_AMBULATORY_CARE_PROVIDER_SITE_OTHER): Payer: Managed Care, Other (non HMO) | Admitting: Internal Medicine

## 2010-08-04 DIAGNOSIS — R0602 Shortness of breath: Secondary | ICD-10-CM

## 2010-08-04 DIAGNOSIS — R071 Chest pain on breathing: Secondary | ICD-10-CM

## 2010-08-04 DIAGNOSIS — R091 Pleurisy: Secondary | ICD-10-CM

## 2010-08-04 DIAGNOSIS — R05 Cough: Secondary | ICD-10-CM

## 2010-08-04 NOTE — Patient Instructions (Signed)
Take the Celebrex samples one twice a day for the rest of the week if the chest pain resolved quickly then please call to notify if the chest pain persists then let us know or if you get acute worsening of his chest pain please go to the ER without hesitation.

## 2010-08-30 ENCOUNTER — Telehealth: Payer: Self-pay | Admitting: Internal Medicine

## 2010-08-30 MED ORDER — OLMESARTAN MEDOXOMIL 40 MG PO TABS: 40.0000 mg | ORAL_TABLET | Freq: Every day | ORAL | Status: DC

## 2010-08-30 NOTE — Telephone Encounter (Signed)
Pt was given samples of Benicar 40 mg and pt says works well. Pt is req a script for this med, to be called in to [facility].

## 2010-09-01 LAB — GLUCOSE, CAPILLARY
Glucose-Capillary: 103 mg/dL — ABNORMAL HIGH (ref 70–99)
Glucose-Capillary: 110 mg/dL — ABNORMAL HIGH (ref 70–99)
Glucose-Capillary: 112 mg/dL — ABNORMAL HIGH (ref 70–99)
Glucose-Capillary: 115 mg/dL — ABNORMAL HIGH (ref 70–99)
Glucose-Capillary: 116 mg/dL — ABNORMAL HIGH (ref 70–99)
Glucose-Capillary: 122 mg/dL — ABNORMAL HIGH (ref 70–99)
Glucose-Capillary: 125 mg/dL — ABNORMAL HIGH (ref 70–99)
Glucose-Capillary: 125 mg/dL — ABNORMAL HIGH (ref 70–99)
Glucose-Capillary: 133 mg/dL — ABNORMAL HIGH (ref 70–99)
Glucose-Capillary: 94 mg/dL (ref 70–99)

## 2010-09-01 LAB — CROSSMATCH
ABO/RH(D): AB POS
ABO/RH(D): AB POS
Antibody Screen: NEGATIVE
Antibody Screen: NEGATIVE

## 2010-09-01 LAB — PREPARE FRESH FROZEN PLASMA

## 2010-09-01 LAB — CARBOXYHEMOGLOBIN
Carboxyhemoglobin: 1.3 % (ref 0.5–1.5)
Methemoglobin: 0.8 % (ref 0.0–1.5)
Total hemoglobin: 10.8 g/dL — ABNORMAL LOW (ref 13.5–18.0)

## 2010-09-01 LAB — CBC
HCT: 26.1 % — ABNORMAL LOW (ref 39.0–52.0)
HCT: 29.7 % — ABNORMAL LOW (ref 39.0–52.0)
HCT: 29.8 % — ABNORMAL LOW (ref 39.0–52.0)
HCT: 34.7 % — ABNORMAL LOW (ref 39.0–52.0)
Hemoglobin: 10.4 g/dL — ABNORMAL LOW (ref 13.0–17.0)
Hemoglobin: 10.5 g/dL — ABNORMAL LOW (ref 13.0–17.0)
Hemoglobin: 8.9 g/dL — ABNORMAL LOW (ref 13.0–17.0)
Hemoglobin: 9.7 g/dL — ABNORMAL LOW (ref 13.0–17.0)
MCHC: 34.2 g/dL (ref 30.0–36.0)
MCHC: 34.6 g/dL (ref 30.0–36.0)
MCHC: 34.6 g/dL (ref 30.0–36.0)
MCHC: 34.9 g/dL (ref 30.0–36.0)
MCHC: 35 g/dL (ref 30.0–36.0)
MCHC: 35.2 g/dL (ref 30.0–36.0)
MCV: 89.8 fL (ref 78.0–100.0)
MCV: 90.3 fL (ref 78.0–100.0)
MCV: 90.3 fL (ref 78.0–100.0)
MCV: 90.9 fL (ref 78.0–100.0)
MCV: 93.2 fL (ref 78.0–100.0)
Platelets: 105 10*3/uL — ABNORMAL LOW (ref 150–400)
Platelets: 151 10*3/uL (ref 150–400)
Platelets: 67 10*3/uL — ABNORMAL LOW (ref 150–400)
Platelets: 88 10*3/uL — ABNORMAL LOW (ref 150–400)
Platelets: 88 10*3/uL — ABNORMAL LOW (ref 150–400)
Platelets: 90 10*3/uL — ABNORMAL LOW (ref 150–400)
Platelets: 113 10*3/uL — ABNORMAL LOW (ref 150–400)
RBC: 3.03 MIL/uL — ABNORMAL LOW (ref 4.22–5.81)
RDW: 13.1 % (ref 11.5–15.5)
RDW: 14.4 % (ref 11.5–15.5)
RDW: 14.4 % (ref 11.5–15.5)
RDW: 14.5 % (ref 11.5–15.5)
RDW: 14.6 % (ref 11.5–15.5)
RDW: 14.7 % (ref 11.5–15.5)
RDW: 14.9 % (ref 11.5–15.5)
WBC: 8 10*3/uL (ref 4.0–10.5)

## 2010-09-01 LAB — POCT I-STAT 3, ART BLOOD GAS (G3+)
Acid-Base Excess: 2 mmol/L (ref 0.0–2.0)
Acid-Base Excess: 3 mmol/L — ABNORMAL HIGH (ref 0.0–2.0)
Bicarbonate: 13.3 mEq/L — ABNORMAL LOW (ref 20.0–24.0)
Bicarbonate: 27.2 mEq/L — ABNORMAL HIGH (ref 20.0–24.0)
Bicarbonate: 27.4 mEq/L — ABNORMAL HIGH (ref 20.0–24.0)
Bicarbonate: 30.6 mEq/L — ABNORMAL HIGH (ref 20.0–24.0)
O2 Saturation: 95 %
O2 Saturation: 97 %
Patient temperature: 35.9
Patient temperature: 38.1
Patient temperature: 38.1
TCO2: 33 mmol/L (ref 0–100)
pCO2 arterial: 45.1 mmHg — ABNORMAL HIGH (ref 35.0–45.0)
pH, Arterial: 7.309 — ABNORMAL LOW (ref 7.350–7.450)
pH, Arterial: 7.396 (ref 7.350–7.450)
pO2, Arterial: 176 mmHg — ABNORMAL HIGH (ref 80.0–100.0)
pO2, Arterial: 77 mmHg — ABNORMAL LOW (ref 80.0–100.0)
pO2, Arterial: 99 mmHg (ref 80.0–100.0)

## 2010-09-01 LAB — MAGNESIUM
Magnesium: 1.3 mg/dL — ABNORMAL LOW (ref 1.5–2.5)
Magnesium: 1.5 mg/dL (ref 1.5–2.5)
Magnesium: 1.8 mg/dL (ref 1.5–2.5)

## 2010-09-01 LAB — DIC (DISSEMINATED INTRAVASCULAR COAGULATION)PANEL
Fibrinogen: 67 mg/dL — CL (ref 204–475)
Platelets: 40 10*3/uL — CL (ref 150–400)
aPTT: >200 seconds (ref 24–37)

## 2010-09-01 LAB — POCT I-STAT, CHEM 8
BUN: 24 mg/dL — ABNORMAL HIGH (ref 6–23)
Calcium, Ion: 1.09 mmol/L — ABNORMAL LOW (ref 1.12–1.32)
Chloride: 107 mEq/L (ref 96–112)
Chloride: 105 mEq/L (ref 96–112)
Creatinine, Ser: 1.3 mg/dL (ref 0.4–1.5)
HCT: 28 % — ABNORMAL LOW (ref 39.0–52.0)
Hemoglobin: 9.5 g/dL — ABNORMAL LOW (ref 13.0–17.0)
Potassium: 3.8 mEq/L (ref 3.5–5.1)
Sodium: 137 mEq/L (ref 135–145)
TCO2: 18 mmol/L (ref 0–100)

## 2010-09-01 LAB — PREPARE PLATELETS

## 2010-09-01 LAB — BASIC METABOLIC PANEL
BUN: 15 mg/dL (ref 6–23)
BUN: 18 mg/dL (ref 6–23)
BUN: 18 mg/dL (ref 6–23)
BUN: 22 mg/dL (ref 6–23)
CO2: 21 mEq/L (ref 19–32)
CO2: 27 mEq/L (ref 19–32)
CO2: 27 mEq/L (ref 19–32)
CO2: 29 mEq/L (ref 19–32)
Calcium: 7.5 mg/dL — ABNORMAL LOW (ref 8.4–10.5)
Calcium: 7.7 mg/dL — ABNORMAL LOW (ref 8.4–10.5)
Chloride: 107 mEq/L (ref 96–112)
Chloride: 110 mEq/L (ref 96–112)
Creatinine, Ser: 1.14 mg/dL (ref 0.4–1.5)
Creatinine, Ser: 1.14 mg/dL (ref 0.4–1.5)
Creatinine, Ser: 1.22 mg/dL (ref 0.4–1.5)
GFR calc non Af Amer: >60 mL/min (ref >60)
GFR calc non Af Amer: >60 mL/min (ref >60)
Glucose, Bld: 119 mg/dL — ABNORMAL HIGH (ref 70–99)
Glucose, Bld: 129 mg/dL — ABNORMAL HIGH (ref 70–99)
Glucose, Bld: 165 mg/dL — ABNORMAL HIGH (ref 70–99)
Glucose, Bld: 198 mg/dL — ABNORMAL HIGH (ref 70–99)
Potassium: 4.1 mEq/L (ref 3.5–5.1)
Potassium: 4.3 mEq/L (ref 3.5–5.1)
Sodium: 136 mEq/L (ref 135–145)
Sodium: 140 mEq/L (ref 135–145)

## 2010-09-01 LAB — POCT I-STAT 7, (LYTES, BLD GAS, ICA,H+H)
Acid-base deficit: 1 mmol/L (ref 0.0–2.0)
Acid-base deficit: 4 mmol/L — ABNORMAL HIGH (ref 0.0–2.0)
Acid-base deficit: 4 mmol/L — ABNORMAL HIGH (ref 0.0–2.0)
Bicarbonate: 26.3 mEq/L — ABNORMAL HIGH (ref 20.0–24.0)
Calcium, Ion: 1 mmol/L — ABNORMAL LOW (ref 1.12–1.32)
Calcium, Ion: 1.03 mmol/L — ABNORMAL LOW (ref 1.12–1.32)
Calcium, Ion: 1.1 mmol/L — ABNORMAL LOW (ref 1.12–1.32)
HCT: 20 % — ABNORMAL LOW (ref 39.0–52.0)
HCT: 27 % — ABNORMAL LOW (ref 39.0–52.0)
HCT: 36 % — ABNORMAL LOW (ref 39.0–52.0)
Hemoglobin: 12.2 g/dL — ABNORMAL LOW (ref 13.0–17.0)
O2 Saturation: 100 %
O2 Saturation: 100 %
Patient temperature: 34.9
Patient temperature: 34.9
Potassium: 4.7 mEq/L (ref 3.5–5.1)
Potassium: 5.1 mEq/L (ref 3.5–5.1)
Potassium: 5.6 mEq/L — ABNORMAL HIGH (ref 3.5–5.1)
Potassium: 5.7 mEq/L — ABNORMAL HIGH (ref 3.5–5.1)
Sodium: 137 mEq/L (ref 135–145)
Sodium: 138 mEq/L (ref 135–145)
Sodium: 143 mEq/L (ref 135–145)
TCO2: 25 mmol/L (ref 0–100)
TCO2: 28 mmol/L (ref 0–100)
TCO2: 29 mmol/L (ref 0–100)
pCO2 arterial: 41.5 mmHg (ref 35.0–45.0)
pH, Arterial: 7.266 — ABNORMAL LOW (ref 7.350–7.450)
pH, Arterial: 7.368 (ref 7.350–7.450)
pH, Arterial: 7.414 (ref 7.350–7.450)
pO2, Arterial: 457 mmHg — ABNORMAL HIGH (ref 80.0–100.0)

## 2010-09-01 LAB — COMPREHENSIVE METABOLIC PANEL
ALT: 28 U/L (ref 0–53)
ALT: 44 U/L (ref 0–53)
AST: 32 U/L (ref 0–37)
Albumin: 2.8 g/dL — ABNORMAL LOW (ref 3.5–5.2)
Albumin: 3.1 g/dL — ABNORMAL LOW (ref 3.5–5.2)
Alkaline Phosphatase: 37 U/L — ABNORMAL LOW (ref 39–117)
Alkaline Phosphatase: 39 U/L (ref 39–117)
BUN: 22 mg/dL (ref 6–23)
BUN: 22 mg/dL (ref 6–23)
CO2: 21 mEq/L (ref 19–32)
Calcium: 7.7 mg/dL — ABNORMAL LOW (ref 8.4–10.5)
Calcium: 8 mg/dL — ABNORMAL LOW (ref 8.4–10.5)
Chloride: 110 mEq/L (ref 96–112)
Chloride: 105 mEq/L (ref 96–112)
Creatinine, Ser: 1.43 mg/dL (ref 0.4–1.5)
GFR calc Af Amer: 55 mL/min — ABNORMAL LOW (ref >60)
GFR calc non Af Amer: 46 mL/min — ABNORMAL LOW (ref >60)
Glucose, Bld: 130 mg/dL — ABNORMAL HIGH (ref 70–99)
Glucose, Bld: 146 mg/dL — ABNORMAL HIGH (ref 70–99)
Potassium: 3.9 mEq/L (ref 3.5–5.1)
Potassium: 4.1 mEq/L (ref 3.5–5.1)
Sodium: 144 mEq/L (ref 135–145)
Sodium: 134 mEq/L — ABNORMAL LOW (ref 135–145)
Total Bilirubin: 1.2 mg/dL (ref 0.3–1.2)
Total Bilirubin: 0.6 mg/dL (ref 0.3–1.2)
Total Protein: 4.4 g/dL — ABNORMAL LOW (ref 6.0–8.3)
Total Protein: 4.5 g/dL — ABNORMAL LOW (ref 6.0–8.3)

## 2010-09-01 LAB — ABO/RH: ABO/RH(D): AB POS

## 2010-09-01 LAB — DIFFERENTIAL
Eosinophils Absolute: 0.2 10*3/uL (ref 0.0–0.7)
Eosinophils Relative: 3 % (ref 0–5)
Lymphs Abs: 2.6 10*3/uL (ref 0.7–4.0)
Monocytes Absolute: 0.5 10*3/uL (ref 0.1–1.0)

## 2010-09-01 LAB — LACTIC ACID, PLASMA: Lactic Acid, Venous: 1.7 mmol/L (ref 0.5–2.2)

## 2010-09-01 LAB — PROTIME-INR: Prothrombin Time: 15.2 seconds (ref 11.6–15.2)

## 2010-09-01 LAB — MRSA PCR SCREENING: MRSA by PCR: NEGATIVE

## 2010-09-01 LAB — APTT: aPTT: 34 seconds (ref 24–37)

## 2010-09-01 LAB — AMYLASE: Amylase: 351 U/L — ABNORMAL HIGH (ref 27–131)

## 2010-09-01 LAB — FIBRINOGEN: Fibrinogen: 360 mg/dL (ref 204–475)

## 2010-09-23 ENCOUNTER — Other Ambulatory Visit: Payer: Self-pay

## 2010-09-24 ENCOUNTER — Other Ambulatory Visit (INDEPENDENT_AMBULATORY_CARE_PROVIDER_SITE_OTHER): Payer: Managed Care, Other (non HMO)

## 2010-09-24 DIAGNOSIS — Z13220 Encounter for screening for lipoid disorders: Secondary | ICD-10-CM

## 2010-09-24 DIAGNOSIS — Z0389 Encounter for observation for other suspected diseases and conditions ruled out: Secondary | ICD-10-CM

## 2010-09-24 DIAGNOSIS — Z0000 Encounter for general adult medical examination without abnormal findings: Secondary | ICD-10-CM

## 2010-09-24 LAB — CBC WITH DIFFERENTIAL/PLATELET
Basophils Absolute: 0.1 10*3/uL (ref 0.0–0.1)
Eosinophils Relative: 4 % (ref 0.0–5.0)
HCT: 37.8 % — ABNORMAL LOW (ref 39.0–52.0)
Lymphocytes Relative: 33.2 % (ref 12.0–46.0)
Monocytes Relative: 7.6 % (ref 3.0–12.0)
Neutrophils Relative %: 54.3 % (ref 43.0–77.0)
Platelets: 141 10*3/uL — ABNORMAL LOW (ref 150.0–400.0)
RDW: 12.9 % (ref 11.5–14.6)
WBC: 9 10*3/uL (ref 4.5–10.5)

## 2010-09-24 LAB — BASIC METABOLIC PANEL
BUN: 23 mg/dL (ref 6–23)
Calcium: 9.1 mg/dL (ref 8.4–10.5)
GFR: 62.69 mL/min (ref >60.00)
Glucose, Bld: 120 mg/dL — ABNORMAL HIGH (ref 70–99)
Potassium: 4.7 mEq/L (ref 3.5–5.1)

## 2010-09-24 LAB — HEPATIC FUNCTION PANEL
ALT: 42 U/L (ref 0–53)
AST: 26 U/L (ref 0–37)
Bilirubin, Direct: 0.1 mg/dL (ref 0.0–0.3)
Total Bilirubin: 0.5 mg/dL (ref 0.3–1.2)
Total Protein: 7.2 g/dL (ref 6.0–8.3)

## 2010-09-24 LAB — LIPID PANEL
HDL: 39.5 mg/dL (ref >39.00)
Triglycerides: 358 mg/dL — ABNORMAL HIGH (ref 0.0–149.0)

## 2010-09-24 LAB — TSH: TSH: 1.08 u[IU]/mL (ref 0.35–5.50)

## 2010-09-24 LAB — LDL CHOLESTEROL, DIRECT: Direct LDL: 67.8 mg/dL

## 2010-09-24 LAB — URINALYSIS
Leukocytes, UA: NEGATIVE
Nitrite: NEGATIVE
Specific Gravity, Urine: 1.025 (ref 1.000–1.030)
Urobilinogen, UA: 0.2 (ref 0.0–1.0)
pH: 5.5 (ref 5.0–8.0)

## 2010-09-24 LAB — PSA: PSA: 0.96 ng/mL (ref 0.10–4.00)

## 2010-09-30 ENCOUNTER — Encounter: Payer: Self-pay | Admitting: Internal Medicine

## 2010-09-30 ENCOUNTER — Ambulatory Visit (INDEPENDENT_AMBULATORY_CARE_PROVIDER_SITE_OTHER): Payer: Managed Care, Other (non HMO) | Admitting: Internal Medicine

## 2010-09-30 VITALS — BP 150/80 | HR 76 | Temp 98.2°F | Resp 16 | Ht 69.0 in | Wt 212.0 lb

## 2010-09-30 DIAGNOSIS — M1710 Unilateral primary osteoarthritis, unspecified knee: Secondary | ICD-10-CM

## 2010-09-30 DIAGNOSIS — E1165 Type 2 diabetes mellitus with hyperglycemia: Secondary | ICD-10-CM

## 2010-09-30 DIAGNOSIS — Z0000 Encounter for general adult medical examination without abnormal findings: Secondary | ICD-10-CM

## 2010-09-30 DIAGNOSIS — I10 Essential (primary) hypertension: Secondary | ICD-10-CM

## 2010-09-30 DIAGNOSIS — E785 Hyperlipidemia, unspecified: Secondary | ICD-10-CM

## 2010-09-30 MED ORDER — AMLODIPINE-OLMESARTAN 5-40 MG PO TABS: 1.0000 | ORAL_TABLET | Freq: Every day | ORAL | Status: DC

## 2010-09-30 NOTE — Progress Notes (Signed)
Subjective:    Patient ID: Kevin [name], male    DOB: 10/23/1940, 70 y.o.   MRN: [id number]  HPI Patient presents for complete physical examination he is also followed for hyperlipidemia with elevated triglycerides a history of elevated blood fasting blood glucose with his last A1c of 6.2 a history of mild anemia and history of benign prostatic hypertrophy. 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 His blood pressure slightly elevated today He has a history of a treated aortic aneurysm.  He has a chief complaint of pain on his left heel that has a pattern that may be plantar fasciitis or a heel spur.   Review of Systems  Constitutional: Negative for fever and fatigue.  HENT: Negative for hearing loss, congestion, neck pain and postnasal drip.   Eyes: Negative for discharge, redness and visual disturbance.  Respiratory: Negative for cough, shortness of breath and wheezing.   Cardiovascular: Positive for chest pain. Negative for leg swelling.  Gastrointestinal: Negative for abdominal pain, constipation and abdominal distention.  Genitourinary: Negative for urgency and frequency.  Musculoskeletal: Negative for joint swelling and arthralgias.  Skin: Negative for color change and rash.  Neurological: Negative for weakness and light-headedness.  Hematological: Negative for adenopathy.  Psychiatric/Behavioral: Negative for behavioral problems.   Past Medical History  Diagnosis Date  . Diabetes mellitus   . Hyperlipidemia   . Hypertension    Past Surgical History  Procedure Date  . Carotid endarterectomy   . Testicular cyst   . Abdominal aortic aneurysm repair     reports that he quit smoking about 20 years ago. He does not have any smokeless tobacco history on file. He reports that he does not drink alcohol or  use illicit drugs. family history includes Colon cancer in his mother; Dementia in his father; and Parkinsonism in his father. No Known Allergies     Objective:   Physical Exam  Constitutional: He is oriented to person, place, and time. He appears well-developed and well-nourished.  HENT:  Head: Normocephalic and atraumatic.  Eyes: Conjunctivae are normal. Pupils are equal, round, and reactive to light.  Neck: Normal range of motion. Neck supple.  Cardiovascular: Normal rate and regular rhythm.   Pulmonary/Chest: Effort normal and breath sounds normal.  Abdominal: Soft. Bowel sounds are normal.  Genitourinary:       bph  Musculoskeletal: He exhibits tenderness.  Neurological: He is alert and oriented to person, place, and time.  Skin: Skin is warm and dry.  Psychiatric: His behavior is normal. Judgment and thought content normal.   his prostate is +2 normal architecture; of his left heel reveals tenderness on the calcaneus consistent with a heel spur        Assessment & Plan:   Patient presents for yearly preventative medicine examination. As well as his chorinic medical problems   all immunizations and health maintenance protocols were reviewed with the patient and they are up to date with these protocols.   screening laboratory values were reviewed with the patient including screening of hyperlipidemia PSA renal function and hepatic function.   There medications past medical history social history problem list and allergies were reviewed in detail.   Goals were established with regard to weight loss exercise diet in compliance with  Patient's blood pressure is poorly controlled at this time and with his  history of an abdominal aortic aneurysm we would strive for a more optimal control. He is currently  Benicar 40 we will increase the Benicar to Azor 40/5 and monitor his blood pressure  His triglycerides are poorly controlled blood sugars 120 indicating that his fasting  hyperglycemia is most probably transitioning to adult-onset diabetes we cautioned him that if he does not lose weight and quit his sugar binges that he will be a diabetic and will need medications we will follow an A1c at his followup visit in 2 months

## 2010-10-12 NOTE — Procedures (Signed)
CAROTID DUPLEX EXAM   INDICATION:  Followup evaluation of known carotid artery disease.   HISTORY:  Diabetes:  No.  Cardiac:  No.  Hypertension:  Yes.  Smoking:  Quit 20 years ago.  Previous Surgery:  Left carotid endarterectomy with primary closure on  September 20, 2004, by Dr. Donnetta Hutching.  CV History:  Patient complains of cramping in the left neck at the  endarterectomy site for the last few months.  Amaurosis Fugax:  No.  Paresthesias:  No.  Hemiparesis:  No.                                       RIGHT             LEFT  Brachial systolic pressure:         150.              152.  Brachial Doppler waveforms:         Triphasic.        Triphasic.  Vertebral direction of flow:        Antegrade.        Antegrade.  DUPLEX VELOCITIES (cm/sec)  CCA peak systolic                   41.               65.  ECA peak systolic                   413.              171.  ICA peak systolic                   103.              102.  ICA end diastolic                   39.               34.  PLAQUE MORPHOLOGY:                  Mixed.            Mixed.  PLAQUE AMOUNT:                      Moderate.         Mild.  PLAQUE LOCATION:                    Proximal ECA, ICA.                  Proximal ECA, ICA.   IMPRESSION:  1. 20 to 39% right ICA stenosis.  2. 20 to 39% left ICA stenosis status post endarterectomy.  3. Bilateral ECA stenosis, right worse than left.   ___________________________________________  Rosetta Posner, M.D.   MC/MEDQ  D:  01/12/2007  T:  01/13/2007  Job:  [id number]

## 2010-10-12 NOTE — Assessment & Plan Note (Signed)
OFFICE VISIT   KREIG, DOCKEN  DOB:  [birth year]                                       06/03/2009  CHART#:[id number]   The patient returns for followup today.  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  He states that  he has regained weight and is continuing to regain strength daily.  He  has not returned to work up to this point.   PHYSICAL EXAM:  Blood pressure is 158/72 in the left arm, heart rate 73  and regular, respirations 24.  Abdomen shows a well-healed midline  laparotomy scar with no evidence of hernias.  He has 2+ femoral pulses  bilaterally.  He has absent popliteal and pedal pulses.  Neck has 2+  carotid pulses without bruit.  He was also checked today for inguinal  hernia as he had some complaints of groin pain.  There was no inguinal  hernia palpable on exam.   Overall the patient continues to improve.  He will follow up in six  months' time for repeat carotid duplex exam.  He should be able to  return to work in February of 2011.     Jessy Oto. Fields, MD  Electronically Signed   CEF/MEDQ  D:  06/03/2009  T:  06/04/2009  Job:  [id number]

## 2010-10-12 NOTE — Assessment & Plan Note (Signed)
OFFICE VISIT   Kevin [name], Kevin [name]  DOB:  03-19-1941                                       01/12/2007  [id number]   Kevin [name] presents today for continued follow up of his extracranial  peripheral vascular occlusive disease.  He is status post left carotid  endarterectomy and primary closure by me on September 20, 2004.  He reports  having some cramping in his left sternocleidomastoid muscle over the  past several months.  He has not had any for the last month.  This was a  mild nuisance to him.  He had not kept his final carotid duplex followup  and is seeing [place] today for evaluation.  His health has otherwise  remained stable.  He does have elevated cholesterol and elevated blood  pressure.  He does not smoke.  He has no cardiac disease.   PHYSICAL EXAMINATION:  GENERAL:  Well-developed, well-nourished white  male appearing stated age of 60.  VITAL SIGNS:  Blood pressure is 137/76, pulse 86, respirations 18,  radial pulses 2+ bilaterally.  NECK:  His left neck incision is well-healed, with no bruits  bilaterally.  NEUROLOGIC:  He is grossly intact neurologically.   He underwent carotid duplex today, and this reveals widely patent  endarterectomy on the left and no significant right carotid stenoses.  He does have a high-grade right external carotid artery stenosis, and I  explained that this is of no consequence.  He will continue his usual  activities.  He  should notify me if he should he have any neurologic deficits;  otherwise, we will see him again on an as needed basis.   Rosetta Posner, M.D.  Electronically Signed   TFE/MEDQ  D:  01/12/2007  [name]:  01/15/2007  Job:  [id number]   cc:   Ricard Dillon, MD

## 2010-10-12 NOTE — Procedures (Signed)
CAROTID DUPLEX EXAM   INDICATION:  Followup to left carotid endarterectomy.   HISTORY:  Diabetes:  No.  Cardiac:  No.  Hypertension:  Yes  Smoking:  No.  Previous Surgery:  Yes.  Left endarterectomy on 09/20/2004 by Dr. Donnetta Hutching.  CV History:  Asymptomatic.  Amaurosis Fugax No, Paresthesias No, Hemiparesis No                                       RIGHT             LEFT  Brachial systolic pressure:         189               190  Brachial Doppler waveforms:         Within normal limits                Within normal limits  Vertebral direction of flow:        Antegrade         Not visualized  DUPLEX VELOCITIES (cm/sec)  CCA peak systolic                   36                66  ECA peak systolic                   128               87  ICA peak systolic                   146               [id number]  ICA end diastolic                   45                39  PLAQUE MORPHOLOGY:                  Heterogeneous     Smooth  PLAQUE AMOUNT:                      Mild              Mild  PLAQUE LOCATION:                    ICA               ICA   IMPRESSION:  1. Right internal carotid artery shows evidence of a 40%-59% stenosis,      an increase from previous exam done in 2006.  2. Left internal carotid artery shows no evidence of hemodynamically      significant stenosis, less than 40%.  3. This appears fairly stable as compared with previous study.        ___________________________________________  Jessy Oto Fields, MD   EM/MEDQ  D:  01/21/2010  T:  01/21/2010  Job:  [id number]

## 2010-10-12 NOTE — Assessment & Plan Note (Signed)
OFFICE VISIT   Kevin [name], Kevin [name]  DOB:  1941/04/30                                       05/06/2009  [id number]   The patient returns for follow-up today.  He underwent repair of a  ruptured abdominal aortic aneurysm on November 9.  Remarkably he did  quite well postoperatively.  He returns for follow-up today.  He still  has some decreased appetite, but overall his strength is improving as  well as his endurance and he is returning to normal bowel function.   PHYSICAL EXAM:  Today blood pressure is 138/67 in the left arm, heart  rate 76 and regular.  Temperature is 98.2.  Abdominal exam shows a well-  healed midline laparotomy scar with no evidence of hernia.  He has 2+  femoral pulses bilaterally and has absent popliteal and pedal pulses.  ABI on the right side today was 0.7, on the left was 0.83.  Waveforms  were biphasic bilaterally.   The patient looks quite well in light of the fact that he recently  underwent repair of a ruptured abdominal aortic aneurysm.  I discussed  with him today that he could return to work probably first of the year.  However, he and his wife wish to have a follow-up appointment to further  evaluate him in 1 month prior to that.  I will see him back at that  time.  I did allow him to drive today as well.  Also encouraged him to  eat a diet high in fruits and vegetables and continue to take in  calories to ensure that he regains his strength as preoperatively.  He  has lost some weight, but was fairly obese preoperatively so this  probably in the long-term will be good for him.  Additionally I have  told him to walk 30 minutes daily to try to build up his physical  endurance.  He also has a known moderate carotid stenosis and will need  to continue to follow this over time.  He will need to have a repeat  carotid duplex exam potentially within a year.  I will see him again in  1 month.     Jessy Oto. Fields, MD  Electronically Signed   CEF/MEDQ  D:  05/06/2009  T:  05/07/2009  Job:  [id number]   cc:   Ricard Dillon, MD

## 2010-10-15 NOTE — H&P (Signed)
NAMEDEKLYN, APPLEWHITE                  ACCOUNT NO.:  [id number]   MEDICAL RECORD NO.:  [id number]          PATIENT TYPE:  OIB   LOCATION:  NA                           FACILITY:  [facility]   PHYSICIAN:  Rosetta Posner, M.D.    DATE OF BIRTH:  April 10, 1941   DATE OF ADMISSION:  09/20/2004  DATE OF DISCHARGE:                                HISTORY & PHYSICAL   ADMISSION DIAGNOSIS:  Symptomatic left internal carotid artery stenosis.   HISTORY OF PRESENT ILLNESS:  The patient is a 70 year old gentleman with a  four week history of visual disturbances. He reports visual field loss and  blurring in his left eye only. He has undergone extensive workup with this  to include carotid duplex which revealed critical stenosis in his left  internal carotid artery. He had moderate stenosis in the right carotid  artery. He has also undergone evaluation by ophthalmologist, Dr. 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 He  also had a visual field deficit. He has had no symptoms of stroke with no  weakness or aphasia. He does have a prior history of right arm weakness in  the 1980s which was felt to be related to migraines.   PAST MEDICAL HISTORY:  Significant for rheumatic fever as a child. He does  have a history of elevated lipids and hypertension. He does not have any  history of congestive heart failure or other cardiac disease.   FAMILY HISTORY:  Negative for premature atherosclerotic disease.   SOCIAL HISTORY:  He is married with two children. He works as a Sales executive. He does not smoke, having quit in 1986. He does not drink  alcohol on a regular basis.   REVIEW OF SYSTEMS:  Significant for shortness of breath with  exertion,  urinary frequency, muscle and joint pain.   MEDICATIONS:  Benicar.   ALLERGIES:  No known drug allergies.   PHYSICAL EXAMINATION:  GENERAL: A well-developed, well-nourished white male  appearing stated age of 41.  VITAL SIGNS:  His blood pressure is 142/76, pulse 76, respirations 18.  NEUROLOGIC: He is grossly intact neurologically. He does not have carotid  bruits bilaterally. He has 2+ radial pulses, 2+ femoral, 2+ popliteal, 2+  dorsalis pedis pulses bilaterally.  HEART: Regular rate and rhythm.  CHEST: Clear bilaterally.  ABDOMEN: Prominent aortic pulsation with possible aneurysm. This is  nontender.   LABORATORY DATA:  Vascular laboratory data reveals critical stenosis in his  left carotid system.   IMPRESSION:  Symptomatic left internal carotid artery stenosis.   PLAN:  The patient will be admitted for elective left carotid endarterectomy  and the procedure including possible complications of stroke and cranial  nerve injury were discussed with the patient who understands.      TFE/MEDQ  D:  09/16/2004  T:  09/16/2004  Job:  [id number]

## 2010-10-15 NOTE — Op Note (Signed)
NAMEDOWELL, GIANGRANDE                  ACCOUNT NO.:  [id number]   MEDICAL RECORD NO.:  [id number]          PATIENT TYPE:  OIB   LOCATION:  2892                         FACILITY:  [facility]   PHYSICIAN:  Rosetta Posner, M.D.    DATE OF BIRTH:  Mar 31, 1941   DATE OF PROCEDURE:  09/20/2004  DATE OF DISCHARGE:                                 OPERATIVE REPORT   PREOPERATIVE DIAGNOSIS:  Symptomatic left internal carotid artery stenosis.   POSTOPERATIVE DIAGNOSIS:  Symptomatic left internal carotid artery stenosis.   PROCEDURE:  Left carotid endarterectomy and primary closure.   SURGEON:  Rosetta Posner, M.D.   ASSISTANT:  John Giovanni, P.A.-C.   ANESTHESIA:  General endotracheal.   COMPLICATIONS:  None.   DISPOSITION:  To the recovery room neurologically intact.   DESCRIPTION OF PROCEDURE:  The patient was taken to the operating room and  placed in the position where the area of the left neck was prepped and  draped in the usual sterile fashion.  An incision was made anterior to the  sternocleidomastoid and carried down through the platysma with  electrocautery.  The sternocleidomastoid was reflected posteriorly and the  carotid sheath was opened.  The facial vein was ligated with #2-0 silk ties  and divided.  The vagus and hypoglossal nerves were identified and  preserved.  The common carotid was encircled with an umbilical tape and a  Rummel tourniquet.  The superior thyroid artery was encircled with a #2-0  silk Potts tie.  The external carotid was encircled with a blue Vesi-loop  and the internal carotid was encircled with an umbilical tape and Rummel  tourniquet.  The patient was given 7000 units of intravenous heparin.  After  an adequate circulation time, the internal and external and common carotid  arteries were occluded.  The common carotid artery was opened with a #11  blade and extended longitudinally with Potts scissors through the plaque,  onto the internal carotid artery.  A  #10 shunt was passed up the internal  carotid and allowed to back-bleed, and then  down the common carotid where  it was secured with Rummel tourniquets.  The endarterectomy was begun on the  common carotid artery and the plaque was divided proximally with Potts  scissors.  The endarterectomy was continued on to the bifurcation and onto  the internal carotid artery which was endarterectomized in an open fashion,  and the external carotid endarterectomized with an eversion technique.  The  patient had a very focal stenosis at the bifurcation.  The internal carotid  artery above this was of a large caliber.  The remaining atheromatous debris  was removed from the endarterectomy plane.  The decision was made to close  the graft primarily, since the patient did have a large caliber internal  carotid artery.  The arteriotomy was closed with a #6-0 Prolene suture  beginning proximally and distally and tacked toward the center.  Prior to  the completion of the closure, the shunt was removed and the usual flushing  maneuvers were undertaken.  The anastomosis was then  completed and the  external, followed by the common, and finally the internal carotid artery  occlusion clamp was removed.  Excellent flow characteristics were noted with  the hand-held Doppler.  The patient was given 50 mg of Protamine to reverse  the heparin.  The wounds were irrigated with saline.  The wounds were closed  with several #3-0 Vicryl sutures, reapproximating the sternocleidomastoid  over the carotid sheath.  Next, the platysma was closed with a running #3-0  Vicryl suture, and finally the skin was closed with a #4-0 subcuticular  Vicryl stitch.  A sterile dressing was applied.   The patient was taken to the recovery room in stable condition.      TFE/MEDQ  D:  09/20/2004  T:  09/20/2004  Job:  [id number]   cc:   Ricard Dillon, M.D. [facility]

## 2010-11-16 ENCOUNTER — Telehealth: Payer: Self-pay | Admitting: Internal Medicine

## 2010-11-16 NOTE — Telephone Encounter (Signed)
Pt instructed to go on clear liquid diet,use prep h and call back if not subsided in 40 hours-dr jenkins agrees

## 2010-11-16 NOTE — Telephone Encounter (Signed)
Pt  Need advice had diarherra since last night  And now he is passing blood, not much but he is concerned

## 2010-11-26 ENCOUNTER — Other Ambulatory Visit (INDEPENDENT_AMBULATORY_CARE_PROVIDER_SITE_OTHER): Payer: Managed Care, Other (non HMO)

## 2010-11-26 DIAGNOSIS — E1165 Type 2 diabetes mellitus with hyperglycemia: Secondary | ICD-10-CM

## 2010-11-26 LAB — BASIC METABOLIC PANEL
CO2: 27 mEq/L (ref 19–32)
Calcium: 8.7 mg/dL (ref 8.4–10.5)
GFR: 62.07 mL/min (ref >60.00)
Sodium: 139 mEq/L (ref 135–145)

## 2010-11-26 LAB — HEMOGLOBIN A1C: Hgb A1c MFr Bld: 6.3 % (ref 4.6–6.5)

## 2010-12-03 ENCOUNTER — Encounter: Payer: Self-pay | Admitting: Internal Medicine

## 2010-12-03 ENCOUNTER — Ambulatory Visit (INDEPENDENT_AMBULATORY_CARE_PROVIDER_SITE_OTHER): Payer: Managed Care, Other (non HMO) | Admitting: Internal Medicine

## 2010-12-03 DIAGNOSIS — E785 Hyperlipidemia, unspecified: Secondary | ICD-10-CM

## 2010-12-03 DIAGNOSIS — R7301 Impaired fasting glucose: Secondary | ICD-10-CM

## 2010-12-03 DIAGNOSIS — I10 Essential (primary) hypertension: Secondary | ICD-10-CM

## 2010-12-03 DIAGNOSIS — M1710 Unilateral primary osteoarthritis, unspecified knee: Secondary | ICD-10-CM

## 2010-12-03 MED ORDER — AMLODIPINE-OLMESARTAN 5-40 MG PO TABS: 1.0000 | ORAL_TABLET | Freq: Every day | ORAL | Status: DC

## 2010-12-03 NOTE — Assessment & Plan Note (Signed)
Patient's blood pressures generally been well controlled but he did not take his medication this morning which explains why his blood pressure was initially 154 and a repeat was [id number] systolic No change in medications planned a basic metabolic panel showed a stable creatinine of 1.2

## 2010-12-03 NOTE — Assessment & Plan Note (Signed)
Stable A1c at 6.3

## 2010-12-03 NOTE — Patient Instructions (Signed)
For the groin injury use ice then heat at least twice a day Continue with the weight loss with a goal of about 195

## 2010-12-03 NOTE — Assessment & Plan Note (Signed)
The pt has progressible OA of the left knee and wishes an injecton He has a hx of multiple joint OA  Informed consent obtained and the patient's joint site was cleaned with Betadine local anesthesia obtained with topical spray 40 mg of Depo-Medrol and 1/2 cc of lidocaine was injected into the joint space the patient tolerated the injection well post injection care discussed with patient ice placed

## 2010-12-03 NOTE — Progress Notes (Signed)
  Subjective:    Patient ID: Kevin [name], male    DOB: 02/20/1941, 70 y.o.   MRN: [id number]  HPI The patient presents for followup of hypertension hyperlipidemia and osteoarthritis.  His blood pressure is generally well controlled but he was at out of his medications this morning and it did not have any pills to take.  His blood pressure was initially elevated at 156/82 but after rest was 145/80.  He denies any chest pain shortness of breath PND orthopnea he is tolerating his medications for his blood pressure and his cholesterol.  He is currently on 4 medications for hypertension.  His chief complaint today involves pain in the left knee he has a history of severe degenerative joint disease in multiple joints status post total knee replacement on the right   Review of Systems  Constitutional: Negative for fever and fatigue.  HENT: Negative for hearing loss, congestion, neck pain and postnasal drip.   Eyes: Negative for discharge, redness and visual disturbance.  Respiratory: Negative for cough, shortness of breath and wheezing.   Cardiovascular: Negative for leg swelling.  Gastrointestinal: Negative for abdominal pain, constipation and abdominal distention.  Genitourinary: Negative for urgency and frequency.  Musculoskeletal: Negative for joint swelling and arthralgias.  Skin: Negative for color change and rash.  Neurological: Negative for weakness and light-headedness.  Hematological: Negative for adenopathy.  Psychiatric/Behavioral: Negative for behavioral problems.   Past Medical History  Diagnosis Date  . Diabetes mellitus   . Hyperlipidemia   . Hypertension    Past Surgical History  Procedure Date  . Carotid endarterectomy   . Testicular cyst   . Abdominal aortic aneurysm repair     reports that he quit smoking about 20 years ago. He does not have any smokeless tobacco history on file. He reports that he does not drink alcohol or use illicit drugs. family history  includes Colon cancer in his mother; Dementia in his father; and Parkinsonism in his father. No Known Allergies     Objective:   Physical Exam  Constitutional: He appears well-developed and well-nourished.  HENT:  Head: Normocephalic and atraumatic.  Eyes: Conjunctivae are normal. Pupils are equal, round, and reactive to light.  Neck: Normal range of motion. Neck supple.  Cardiovascular: Normal rate and regular rhythm.   Pulmonary/Chest: Effort normal and breath sounds normal.  Abdominal: Soft. Bowel sounds are normal.  Musculoskeletal: He exhibits edema and tenderness.          Assessment & Plan:  Hypertension generally well-controlled with the exception today because he did not take his medications appears compliant to his medications given that he is on 4 drugs missing his medications on a day would cause an elevation of his blood pressure consistent with what was thought today.  Otherwise he states he is tolerating his medications and has had no hypotensive or marked hypertensive episodes.  His cholesterol is well controlled with current medication reviewed last laboratory values.  He gave informed consent and we injected his knee with 40 mg of Depo-Medrol

## 2010-12-07 ENCOUNTER — Other Ambulatory Visit: Payer: Self-pay | Admitting: *Deleted

## 2010-12-07 MED ORDER — AMLODIPINE-OLMESARTAN 5-40 MG PO TABS: 1.0000 | ORAL_TABLET | Freq: Every day | ORAL | Status: DC

## 2010-12-16 ENCOUNTER — Other Ambulatory Visit: Payer: Self-pay | Admitting: Internal Medicine

## 2010-12-21 MED ORDER — TIZANIDINE HCL 4 MG PO TABS: 4.0000 mg | ORAL_TABLET | Freq: Four times a day (QID) | ORAL | Status: DC | PRN

## 2010-12-26 ENCOUNTER — Other Ambulatory Visit: Payer: Self-pay | Admitting: Internal Medicine

## 2011-01-11 ENCOUNTER — Encounter: Payer: Self-pay | Admitting: Vascular Surgery

## 2011-01-27 ENCOUNTER — Ambulatory Visit: Payer: Self-pay | Admitting: Vascular Surgery

## 2011-01-27 ENCOUNTER — Ambulatory Visit (INDEPENDENT_AMBULATORY_CARE_PROVIDER_SITE_OTHER): Payer: Managed Care, Other (non HMO)

## 2011-01-27 DIAGNOSIS — Z48812 Encounter for surgical aftercare following surgery on the circulatory system: Secondary | ICD-10-CM

## 2011-01-27 DIAGNOSIS — I6529 Occlusion and stenosis of unspecified carotid artery: Secondary | ICD-10-CM

## 2011-02-10 ENCOUNTER — Encounter: Payer: Self-pay | Admitting: Gastroenterology

## 2011-02-18 ENCOUNTER — Encounter: Payer: Self-pay | Admitting: Vascular Surgery

## 2011-02-18 NOTE — Procedures (Unsigned)
CAROTID DUPLEX EXAM  INDICATION:  Left carotid endarterectomy.  HISTORY: Diabetes:  No. Cardiac:  No. Hypertension:  Yes. Smoking:  No. Previous Surgery:  Left carotid endarterectomy on 09/20/2004. CV History:  Currently asymptomatic. Amaurosis Fugax No, Paresthesias No, Hemiparesis No                                      RIGHT             LEFT Brachial systolic pressure:         182               178 Brachial Doppler waveforms:         Normal            Normal Vertebral direction of flow:        Antegrade         Not detected DUPLEX VELOCITIES (cm/sec) CCA peak systolic                   40                75 ECA peak systolic                   62                [id number] ICA peak systolic                   100               [id number] ICA end diastolic                   34                31 PLAQUE MORPHOLOGY:                  Heterogeneous     Heterogeneous PLAQUE AMOUNT:                      Mild              Mild PLAQUE LOCATION:                    ICA/ECA/CCA       ICA/CCA  IMPRESSION: 1. Patent left carotid endarterectomy site with no hemodynamically     significant stenosis of the bilateral internal carotid arteries.     Plaque formations as described above. 2. The velocities of the right internal carotid artery appear less     than previously recorded when compared to the previous exam on     01/21/2010.  ___________________________________________ Jessy Oto. Fields, MD  CH/MEDQ  D:  01/28/2011  T:  01/28/2011  Job:  [id number]

## 2011-02-25 ENCOUNTER — Other Ambulatory Visit: Payer: Self-pay | Admitting: Vascular Surgery

## 2011-02-25 DIAGNOSIS — Z48811 Encounter for surgical aftercare following surgery on the nervous system: Secondary | ICD-10-CM

## 2011-02-25 DIAGNOSIS — I6529 Occlusion and stenosis of unspecified carotid artery: Secondary | ICD-10-CM

## 2011-03-04 ENCOUNTER — Other Ambulatory Visit: Payer: Self-pay | Admitting: Vascular Surgery

## 2011-03-21 ENCOUNTER — Other Ambulatory Visit: Payer: Self-pay | Admitting: Internal Medicine

## 2011-04-05 ENCOUNTER — Ambulatory Visit: Payer: Managed Care, Other (non HMO) | Admitting: Internal Medicine

## 2011-06-15 ENCOUNTER — Other Ambulatory Visit: Payer: Self-pay | Admitting: Internal Medicine

## 2011-07-26 ENCOUNTER — Other Ambulatory Visit: Payer: Self-pay | Admitting: Internal Medicine

## 2011-08-24 ENCOUNTER — Other Ambulatory Visit: Payer: Self-pay | Admitting: Internal Medicine

## 2011-09-26 ENCOUNTER — Other Ambulatory Visit (INDEPENDENT_AMBULATORY_CARE_PROVIDER_SITE_OTHER): Payer: Managed Care, Other (non HMO)

## 2011-09-26 DIAGNOSIS — Z0000 Encounter for general adult medical examination without abnormal findings: Secondary | ICD-10-CM

## 2011-09-26 LAB — CBC WITH DIFFERENTIAL/PLATELET
Eosinophils Relative: 3.5 % (ref 0.0–5.0)
HCT: 38.3 % — ABNORMAL LOW (ref 39.0–52.0)
Hemoglobin: 12.9 g/dL — ABNORMAL LOW (ref 13.0–17.0)
Lymphs Abs: 2.5 10*3/uL (ref 0.7–4.0)
Monocytes Relative: 8.7 % (ref 3.0–12.0)
Neutro Abs: 4.7 10*3/uL (ref 1.4–7.7)
RBC: 4.14 Mil/uL — ABNORMAL LOW (ref 4.22–5.81)
WBC: 8.3 10*3/uL (ref 4.5–10.5)

## 2011-09-26 LAB — HEPATIC FUNCTION PANEL
ALT: 34 U/L (ref 0–53)
AST: 27 U/L (ref 0–37)
Albumin: 4 g/dL (ref 3.5–5.2)
Total Bilirubin: 0.3 mg/dL (ref 0.3–1.2)

## 2011-09-26 LAB — LIPID PANEL: VLDL: 54.2 mg/dL — ABNORMAL HIGH (ref 0.0–40.0)

## 2011-09-26 LAB — POCT URINALYSIS DIPSTICK
Glucose, UA: NEGATIVE
Leukocytes, UA: NEGATIVE
Spec Grav, UA: 1.02
Urobilinogen, UA: 0.2

## 2011-09-26 LAB — BASIC METABOLIC PANEL
GFR: 68.35 mL/min (ref >60.00)
Potassium: 4.6 mEq/L (ref 3.5–5.1)
Sodium: 140 mEq/L (ref 135–145)

## 2011-09-26 LAB — PSA: PSA: 0.84 ng/mL (ref 0.10–4.00)

## 2011-09-26 LAB — LDL CHOLESTEROL, DIRECT: Direct LDL: 69.9 mg/dL

## 2011-09-26 LAB — TSH: TSH: 0.85 u[IU]/mL (ref 0.35–5.50)

## 2011-09-26 LAB — HEMOGLOBIN A1C: Hgb A1c MFr Bld: 6.1 % (ref 4.6–6.5)

## 2011-10-03 ENCOUNTER — Encounter: Payer: Self-pay | Admitting: Internal Medicine

## 2011-10-03 ENCOUNTER — Ambulatory Visit (INDEPENDENT_AMBULATORY_CARE_PROVIDER_SITE_OTHER): Payer: Managed Care, Other (non HMO) | Admitting: Internal Medicine

## 2011-10-03 VITALS — BP 166/80 | HR 76 | Temp 98.2°F | Resp 16 | Ht 69.5 in | Wt 209.0 lb

## 2011-10-03 DIAGNOSIS — G4700 Insomnia, unspecified: Secondary | ICD-10-CM

## 2011-10-03 DIAGNOSIS — M25569 Pain in unspecified knee: Secondary | ICD-10-CM

## 2011-10-03 DIAGNOSIS — Z0000 Encounter for general adult medical examination without abnormal findings: Secondary | ICD-10-CM

## 2011-10-03 DIAGNOSIS — I10 Essential (primary) hypertension: Secondary | ICD-10-CM

## 2011-10-03 MED ORDER — TRAMADOL HCL 50 MG PO TABS: 50.0000 mg | ORAL_TABLET | Freq: Three times a day (TID) | ORAL | Status: DC | PRN

## 2011-10-03 NOTE — Patient Instructions (Addendum)
The patient is instructed to continue all medications as prescribed. Schedule followup with check out clerk upon leaving the clinic Please work to eliminate the salt containing foods as her blood pressure was elevated today monitor your blood pressure carefully in the intervening 3 months before you return if it continues to be elevated contact my office sooner

## 2011-10-03 NOTE — Progress Notes (Signed)
Subjective:    Patient ID: Kevin [name], male    DOB: 11-29-1940, 71 y.o.   MRN: [id number]  HPI CPX Ate a bag of salt containing chips, and had "nabs and  Barbecue for lunch Blood pressure elevated Insomnia taking advil PM Knee pain in the left knee that seems to respond to the tramadol but did not get significanly better with the injection   Review of Systems  Constitutional: Negative for fever and fatigue.  HENT: Negative for hearing loss, congestion, neck pain and postnasal drip.   Eyes: Negative for discharge, redness and visual disturbance.  Respiratory: Negative for cough, shortness of breath and wheezing.   Cardiovascular: Negative for leg swelling.  Gastrointestinal: Negative for abdominal pain, constipation and abdominal distention.  Genitourinary: Negative for urgency and frequency.  Musculoskeletal: Negative for joint swelling and arthralgias.  Skin: Negative for color change and rash.  Neurological: Negative for weakness and light-headedness.  Hematological: Negative for adenopathy.  Psychiatric/Behavioral: Negative for behavioral problems.   Past Medical History  Diagnosis Date  . Diabetes mellitus   . Hyperlipidemia   . Hypertension     History   Social History  . Marital Status: Married    Spouse Name: N/A    Number of Children: N/A  . Years of Education: N/A   Occupational History  . Not on file.   Social History Main Topics  . Smoking status: Former Smoker    Quit date: 05/30/1990  . Smokeless tobacco: Not on file  . Alcohol Use: No  . Drug Use: No  . Sexually Active: Yes   Other Topics Concern  . Not on file   Social History Narrative  . No narrative on file    Past Surgical History  Procedure Date  . Carotid endarterectomy   . Testicular cyst   . Abdominal aortic aneurysm repair     Family History  Problem Relation Age of Onset  . Parkinsonism Father   . Dementia Father   . Colon cancer Mother     No Known  Allergies  Current Outpatient Prescriptions on File Prior to Visit  Medication Sig Dispense Refill  . amLODipine-olmesartan (AZOR) 5-40 MG per tablet Take 1 tablet by mouth daily.  30 tablet  11  . Ascorbic Acid (VITAMIN C) 500 MG CHEW Chew by mouth daily.        Marland Kitchen aspirin 81 MG chewable tablet Chew 81 mg by mouth daily.        . bisoprolol-hydrochlorothiazide (ZIAC) 10-6.25 MG per tablet TAKE ONE TABLET BY MOUTH EVERY DAY  30 tablet  11  . diclofenac (VOLTAREN) 75 MG EC tablet TAKE ONE TABLET BY MOUTH TWICE DAILY AS NEEDED  60 tablet  6  . Melatonin 3 MG TABS Take 1 tablet by mouth at bedtime.        . niacin (NIASPAN) 500 MG CR tablet Take 500 mg by mouth daily.        . OMEGA 3 1000 MG CAPS Take 2 capsules by mouth daily.        . Saw Palmetto, Serenoa repens, 1000 MG CAPS Take by mouth daily.        . Tamsulosin HCl (FLOMAX) 0.4 MG CAPS Take by mouth daily after supper.        Marland Kitchen tiZANidine (ZANAFLEX) 4 MG capsule Take 4 mg by mouth as needed. For muscle spasm       . VYTORIN 10-40 MG per tablet TAKE ONE TABLET BY MOUTH EVERY DAY  30 each  11    BP 166/80  Pulse 76  Temp 98.2 F (36.8 C)  Resp 16  Ht 5' 9.5" (1.765 m)  Wt 209 lb (94.802 kg)  BMI 30.42 kg/m2       Objective:   Physical Exam  Nursing note and vitals reviewed. Constitutional: He is oriented to person, place, and time. He appears well-developed and well-nourished.  HENT:  Head: Normocephalic and atraumatic.  Eyes: Conjunctivae are normal. Pupils are equal, round, and reactive to light.  Neck: Normal range of motion. Neck supple.  Cardiovascular: Normal rate and regular rhythm.   Pulmonary/Chest: Effort normal and breath sounds normal.  Abdominal: Soft. Bowel sounds are normal.  Genitourinary: Rectum normal.  Neurological: He is alert and oriented to person, place, and time.  Skin: Skin is warm and dry.          Assessment & Plan:

## 2011-11-17 ENCOUNTER — Emergency Department (HOSPITAL_COMMUNITY): Payer: Managed Care, Other (non HMO)

## 2011-11-17 ENCOUNTER — Inpatient Hospital Stay (HOSPITAL_COMMUNITY)
Admission: EM | Admit: 2011-11-17 | Discharge: 2011-11-19 | DRG: 066 | Disposition: A | Discharge status: Home / Self Care | Payer: Managed Care, Other (non HMO) | Attending: Internal Medicine | Admitting: Internal Medicine

## 2011-11-17 ENCOUNTER — Telehealth: Payer: Self-pay

## 2011-11-17 ENCOUNTER — Observation Stay (HOSPITAL_COMMUNITY): Payer: Managed Care, Other (non HMO)

## 2011-11-17 ENCOUNTER — Inpatient Hospital Stay (HOSPITAL_COMMUNITY): Payer: Managed Care, Other (non HMO)

## 2011-11-17 ENCOUNTER — Encounter (HOSPITAL_COMMUNITY): Payer: Self-pay | Admitting: *Deleted

## 2011-11-17 DIAGNOSIS — Z8673 Personal history of transient ischemic attack (TIA), and cerebral infarction without residual deficits: Secondary | ICD-10-CM | POA: Diagnosis present

## 2011-11-17 DIAGNOSIS — E119 Type 2 diabetes mellitus without complications: Secondary | ICD-10-CM | POA: Diagnosis present

## 2011-11-17 DIAGNOSIS — G459 Transient cerebral ischemic attack, unspecified: Secondary | ICD-10-CM

## 2011-11-17 DIAGNOSIS — R7301 Impaired fasting glucose: Secondary | ICD-10-CM | POA: Diagnosis present

## 2011-11-17 DIAGNOSIS — I10 Essential (primary) hypertension: Secondary | ICD-10-CM

## 2011-11-17 DIAGNOSIS — Z87891 Personal history of nicotine dependence: Secondary | ICD-10-CM

## 2011-11-17 DIAGNOSIS — Z79899 Other long term (current) drug therapy: Secondary | ICD-10-CM

## 2011-11-17 DIAGNOSIS — Z7902 Long term (current) use of antithrombotics/antiplatelets: Secondary | ICD-10-CM

## 2011-11-17 DIAGNOSIS — R531 Weakness: Secondary | ICD-10-CM

## 2011-11-17 DIAGNOSIS — R29810 Facial weakness: Secondary | ICD-10-CM | POA: Diagnosis present

## 2011-11-17 DIAGNOSIS — I498 Other specified cardiac arrhythmias: Secondary | ICD-10-CM | POA: Diagnosis present

## 2011-11-17 DIAGNOSIS — I2510 Atherosclerotic heart disease of native coronary artery without angina pectoris: Secondary | ICD-10-CM

## 2011-11-17 DIAGNOSIS — E785 Hyperlipidemia, unspecified: Secondary | ICD-10-CM | POA: Diagnosis present

## 2011-11-17 DIAGNOSIS — I6350 Cerebral infarction due to unspecified occlusion or stenosis of unspecified cerebral artery: Principal | ICD-10-CM | POA: Diagnosis present

## 2011-11-17 DIAGNOSIS — E782 Mixed hyperlipidemia: Secondary | ICD-10-CM

## 2011-11-17 DIAGNOSIS — I639 Cerebral infarction, unspecified: Secondary | ICD-10-CM

## 2011-11-17 LAB — DIFFERENTIAL
Eosinophils Absolute: 0.3 10*3/uL (ref 0.0–0.7)
Lymphocytes Relative: 23 % (ref 12–46)
Lymphs Abs: 2 10*3/uL (ref 0.7–4.0)
Monocytes Relative: 7 % (ref 3–12)
Neutro Abs: 5.7 10*3/uL (ref 1.7–7.7)
Neutrophils Relative %: 66 % (ref 43–77)

## 2011-11-17 LAB — COMPREHENSIVE METABOLIC PANEL
ALT: 32 U/L (ref 0–53)
AST: 24 U/L (ref 0–37)
AST: 24 U/L (ref 0–37)
Alkaline Phosphatase: 55 U/L (ref 39–117)
BUN: 22 mg/dL (ref 6–23)
CO2: 24 mEq/L (ref 19–32)
CO2: 23 mEq/L (ref 19–32)
Calcium: 9.1 mg/dL (ref 8.4–10.5)
Calcium: 8.8 mg/dL (ref 8.4–10.5)
Chloride: 105 mEq/L (ref 96–112)
Creatinine, Ser: 1.22 mg/dL (ref 0.50–1.35)
GFR calc Af Amer: 67 mL/min — ABNORMAL LOW (ref >90)
GFR calc non Af Amer: 57 mL/min — ABNORMAL LOW (ref >90)
Glucose, Bld: 76 mg/dL (ref 70–99)
Potassium: 4.8 mEq/L (ref 3.5–5.1)
Sodium: 140 mEq/L (ref 135–145)
Total Bilirubin: 0.3 mg/dL (ref 0.3–1.2)
Total Protein: 6.7 g/dL (ref 6.0–8.3)

## 2011-11-17 LAB — CBC
Hemoglobin: 12.3 g/dL — ABNORMAL LOW (ref 13.0–17.0)
Hemoglobin: 11.9 g/dL — ABNORMAL LOW (ref 13.0–17.0)
MCH: 30 pg (ref 26.0–34.0)
MCHC: 34.6 g/dL (ref 30.0–36.0)
Platelets: 161 10*3/uL (ref 150–400)
Platelets: 171 10*3/uL (ref 150–400)
RBC: 4.01 MIL/uL — ABNORMAL LOW (ref 4.22–5.81)
RBC: 3.97 MIL/uL — ABNORMAL LOW (ref 4.22–5.81)
WBC: 8.8 10*3/uL (ref 4.0–10.5)

## 2011-11-17 LAB — APTT: aPTT: 39 seconds — ABNORMAL HIGH (ref 24–37)

## 2011-11-17 MED ORDER — SENNOSIDES-DOCUSATE SODIUM 8.6-50 MG PO TABS: 1.0000 | ORAL_TABLET | Freq: Every evening | ORAL | Status: DC | PRN

## 2011-11-17 MED ORDER — OXYCODONE HCL 5 MG PO TABS
5.0000 mg | ORAL_TABLET | Freq: Four times a day (QID) | ORAL | Status: DC | PRN
  Administered 2011-11-18: 5 mg via ORAL
  Filled 2011-11-17: qty 1

## 2011-11-17 MED ORDER — NIACIN ER (ANTIHYPERLIPIDEMIC) 500 MG PO TBCR
500.0000 mg | EXTENDED_RELEASE_TABLET | Freq: Every day | ORAL | Status: DC
  Administered 2011-11-18 – 2011-11-19 (×2): 500 mg via ORAL
  Filled 2011-11-17 (×2): qty 1

## 2011-11-17 MED ORDER — ONDANSETRON HCL 4 MG/2ML IJ SOLN: 4.0000 mg | Freq: Four times a day (QID) | INTRAMUSCULAR | Status: DC | PRN

## 2011-11-17 MED ORDER — SODIUM CHLORIDE 0.45 % IV SOLN
INTRAVENOUS | Status: DC
  Administered 2011-11-17: via INTRAVENOUS
  Administered 2011-11-18: 1000 mL via INTRAVENOUS

## 2011-11-17 MED ORDER — EZETIMIBE-SIMVASTATIN 10-40 MG PO TABS
1.0000 | ORAL_TABLET | Freq: Every day | ORAL | Status: DC
  Administered 2011-11-18: 1 via ORAL
  Filled 2011-11-17 (×2): qty 1

## 2011-11-17 MED ORDER — ASPIRIN 300 MG RE SUPP
300.0000 mg | Freq: Every day | RECTAL | Status: DC
  Filled 2011-11-17: qty 1

## 2011-11-17 MED ORDER — AMLODIPINE-OLMESARTAN 5-40 MG PO TABS: 1.0000 | ORAL_TABLET | Freq: Every day | ORAL | Status: DC

## 2011-11-17 MED ORDER — ASPIRIN 325 MG PO TABS
325.0000 mg | ORAL_TABLET | Freq: Every day | ORAL | Status: DC
  Filled 2011-11-17: qty 1

## 2011-11-17 MED ORDER — TAMSULOSIN HCL 0.4 MG PO CAPS
0.4000 mg | ORAL_CAPSULE | Freq: Every day | ORAL | Status: DC
Start: 2011-11-18 — End: 2011-11-19
  Administered 2011-11-18: 0.4 mg via ORAL
  Filled 2011-11-17 (×2): qty 1

## 2011-11-17 MED ORDER — TIZANIDINE HCL 4 MG PO TABS
4.0000 mg | ORAL_TABLET | ORAL | Status: DC | PRN
  Filled 2011-11-17: qty 1

## 2011-11-17 MED ORDER — ASPIRIN 81 MG PO CHEW
324.0000 mg | CHEWABLE_TABLET | Freq: Once | ORAL | Status: AC
  Administered 2011-11-17: 324 mg via ORAL
  Filled 2011-11-17: qty 4

## 2011-11-17 MED ORDER — ENOXAPARIN SODIUM 40 MG/0.4ML ~~LOC~~ SOLN
40.0000 mg | SUBCUTANEOUS | Status: DC
  Administered 2011-11-18 – 2011-11-19 (×2): 40 mg via SUBCUTANEOUS
  Filled 2011-11-17 (×2): qty 0.4

## 2011-11-17 MED ORDER — ACETAMINOPHEN 325 MG PO TABS
650.0000 mg | ORAL_TABLET | ORAL | Status: DC | PRN
  Administered 2011-11-18: 650 mg via ORAL
  Filled 2011-11-17 (×2): qty 2

## 2011-11-17 MED ORDER — HYDROMORPHONE HCL PF 1 MG/ML IJ SOLN: 1.0000 mg | INTRAMUSCULAR | Status: DC | PRN

## 2011-11-17 MED ORDER — OMEGA-3-ACID ETHYL ESTERS 1 G PO CAPS
2.0000 g | ORAL_CAPSULE | Freq: Every day | ORAL | Status: DC
  Administered 2011-11-18 – 2011-11-19 (×2): 2 g via ORAL
  Filled 2011-11-17 (×3): qty 2

## 2011-11-17 MED ORDER — INSULIN ASPART 100 UNIT/ML ~~LOC~~ SOLN: 0.0000 [IU] | SUBCUTANEOUS | Status: DC

## 2011-11-17 MED ORDER — BISOPROLOL-HYDROCHLOROTHIAZIDE 10-6.25 MG PO TABS
1.0000 | ORAL_TABLET | Freq: Every day | ORAL | Status: DC
  Administered 2011-11-18 – 2011-11-19 (×2): 1 via ORAL
  Filled 2011-11-17 (×2): qty 1

## 2011-11-17 NOTE — ED Notes (Signed)
Patient transported to X-ray 

## 2011-11-17 NOTE — H&P (Signed)
DATE OF ADMISSION:  11/17/2011  PCP:    Georgetta Haber, MD   Chief Complaint: Left Sided Weakness  HPI: Kevin [name] is an 71 y.o. male who began to have left facial weakness and Left hand numbness and left lateral thigh numbness that was noticed upon awakening at 6 AM.  He reports also having a left sided headache but denied any chest pain, or SOB or nausea or vomiting or fevers or chills. He denied having any difficulty with speech or swallowing.  He denied having seizures or syncope associated with the event.    He reports taking a nap but awakened an the symptoms had not resolved his wife insisted that he call the doctor so he called his doctor and was advised to go to the hospital right away.  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    Past Medical History  Diagnosis Date  . Diabetes mellitus   . Hyperlipidemia   . Hypertension     Past Surgical History  Procedure Date  . Carotid endarterectomy   . Testicular cyst   . Abdominal aortic aneurysm repair   . Abdominal aortic aneurysm repair     Medications:  HOME MEDS: Prior to Admission medications   Medication Sig Start Date End Date Taking? Authorizing Provider  amLODipine-olmesartan (AZOR) 5-40 MG per tablet Take 1 tablet by mouth daily.   Yes Historical Provider, MD  Ascorbic Acid (VITAMIN C) 500 MG CHEW Chew by mouth daily.     Yes Historical Provider, MD  aspirin 81 MG chewable tablet Chew 81 mg by mouth daily.     Yes Historical Provider, MD  bisoprolol-hydrochlorothiazide (ZIAC) 10-6.25 MG per tablet Take 1 tablet by mouth daily.   Yes Historical Provider, MD  diclofenac (VOLTAREN) 75 MG EC tablet Take 75 mg by mouth 2 (two) times daily as needed. For pain   Yes Historical Provider, MD    ezetimibe-simvastatin (VYTORIN) 10-40 MG per tablet Take 1 tablet by mouth daily.   Yes Historical Provider, MD  Melatonin 3 MG TABS Take 1 tablet by mouth at bedtime.     Yes Historical Provider, MD  niacin (NIASPAN) 500 MG CR tablet Take 500 mg by mouth daily.     Yes Historical Provider, MD  OMEGA 3 1000 MG CAPS Take 2 capsules by mouth daily.     Yes Historical Provider, MD  Saw Palmetto, Serenoa repens, 1000 MG CAPS Take by mouth daily.     Yes Historical Provider, MD  Tamsulosin HCl (FLOMAX) 0.4 MG CAPS Take by mouth daily after supper.     Yes Historical Provider, MD  tiZANidine (ZANAFLEX) 4 MG capsule Take 4 mg by mouth as needed. For muscle spasm    Yes Historical Provider, MD    Allergies:  No Known Allergies  Social History:   reports that he quit smoking about 21 years ago. He does not have any smokeless tobacco history on file. He reports that he does not drink alcohol or use illicit drugs.  Family History: Family History  Problem Relation Age of Onset  . Parkinsonism Father   . Dementia Father   . Colon cancer Mother     Review of Systems:  The patient denies anorexia, fever, weight loss, vision loss, decreased hearing,  hoarseness, chest pain, syncope, dyspnea on exertion, peripheral edema, balance deficits, hemoptysis, abdominal pain, melena, hematochezia, severe indigestion/heartburn, hematuria, incontinence, genital sores, muscle weakness, suspicious skin lesions, transient blindness, difficulty walking, depression, unusual weight change, abnormal bleeding, enlarged lymph nodes, angioedema, and breast masses.   Physical Exam:  GEN:  Pleasant 71 year old well nourished and well developed Caucasian male examined  and in no acute distress; cooperative with exam Filed Vitals:   11/17/11 1609 11/17/11 1744  BP: 163/61 160/83  Pulse: 56 58  Temp: 98.4 F (36.9 C)   TempSrc: Oral   Resp: 22 16  Height: 5' 9.5" (1.765 m)   Weight: 50.803 kg (112 lb)   SpO2: 96% 99%    Blood pressure 160/83, pulse 58, temperature 98.4 F (36.9 C), temperature source Oral, resp. rate 16, height 5' 9.5" (1.765 m), weight 50.803 kg (112 lb), SpO2 99.00%. PSYCH: He is alert and oriented x4; does not appear anxious does not appear depressed; affect is normal HEENT: Normocephalic and Atraumatic, Mucous membranes pink; PERRLA; EOM intact; Fundi:  Benign;  No scleral icterus, Nares: Patent, Oropharynx: Clear, Fair Dentition, Neck:  FROM, no cervical lymphadenopathy nor thyromegaly or carotid bruit; no JVD; Breasts:: Not examined CHEST WALL: No tenderness CHEST: Normal respiration, clear to auscultation bilaterally HEART: Regular rate and rhythm; no murmurs rubs or gallops BACK: No kyphosis or scoliosis; no CVA tenderness ABDOMEN: Positive Bowel Sounds, Scaphoid, Obese, soft non-tender; no masses, no organomegaly, no pannus; no intertriginous candida. Rectal Exam: Not done EXTREMITIES: No bone or joint deformity; age-appropriate arthropathy of the hands and knees; no cyanosis, clubbing or edema; no ulcerations. Genitalia: not examined PULSES: 2+ and symmetric SKIN: Normal hydration no rash or ulceration CNS: Cranial nerves 2-12 grossly intact no focal neurologic deficit   Labs & Imaging Results for orders placed during the hospital encounter of 11/17/11 (from the past 48 hour(s))  GLUCOSE, CAPILLARY     Status: Abnormal   Collection Time   11/17/11  4:16 PM      Component Value Range Comment   Glucose-Capillary 199 (*) 70 - 99 mg/dL    Comment 1 Documented in Chart      Comment 2 Notify RN     COMPREHENSIVE METABOLIC PANEL     Status: Abnormal   Collection Time   11/17/11  5:14 PM      Component Value Range Comment   Sodium 140  135 - 145 mEq/L    Potassium 4.2  3.5 - 5.1 mEq/L    Chloride 105  96 - 112 mEq/L    CO2 24  19 - 32 mEq/L    Glucose, Bld 132 (*) 70 - 99 mg/dL    BUN 22  6 - 23 mg/dL    Creatinine, Ser 1.22  0.50 - 1.35 mg/dL    Calcium 9.1  8.4 - 10.5  mg/dL    Total Protein 7.1  6.0 - 8.3 g/dL    Albumin 3.9  3.5 - 5.2 g/dL    AST 24  0 - 37 U/L    ALT 33  0 - 53 U/L    Alkaline Phosphatase 58  39 - 117 U/L    Total Bilirubin 0.3  0.3 - 1.2 mg/dL    GFR calc non Af Amer 57 (*) >90 mL/min    GFR calc Af Amer 67 (*) >90 mL/min   CBC     Status: Abnormal   Collection Time   11/17/11  5:14 PM  Component Value Range Comment   WBC 8.8  4.0 - 10.5 K/uL    RBC 4.01 (*) 4.22 - 5.81 MIL/uL    Hemoglobin 12.3 (*) 13.0 - 17.0 g/dL    HCT 35.0 (*) 39.0 - 52.0 %    MCV 87.3  78.0 - 100.0 fL    MCH 30.7  26.0 - 34.0 pg    MCHC 35.1  30.0 - 36.0 g/dL    RDW 12.8  11.5 - 15.5 %    Platelets 161  150 - 400 K/uL   DIFFERENTIAL     Status: Normal   Collection Time   11/17/11  5:14 PM      Component Value Range Comment   Neutrophils Relative 66  43 - 77 %    Neutro Abs 5.7  1.7 - 7.7 K/uL    Lymphocytes Relative 23  12 - 46 %    Lymphs Abs 2.0  0.7 - 4.0 K/uL    Monocytes Relative 7  3 - 12 %    Monocytes Absolute 0.6  0.1 - 1.0 K/uL    Eosinophils Relative 4  0 - 5 %    Eosinophils Absolute 0.3  0.0 - 0.7 K/uL    Basophils Relative 1  0 - 1 %    Basophils Absolute 0.1  0.0 - 0.1 K/uL   CBC     Status: Abnormal   Collection Time   11/17/11  7:55 PM      Component Value Range Comment   WBC 8.6  4.0 - 10.5 K/uL    RBC 3.97 (*) 4.22 - 5.81 MIL/uL    Hemoglobin 11.9 (*) 13.0 - 17.0 g/dL    HCT 34.4 (*) 39.0 - 52.0 %    MCV 86.6  78.0 - 100.0 fL    MCH 30.0  26.0 - 34.0 pg    MCHC 34.6  30.0 - 36.0 g/dL    RDW 12.9  11.5 - 15.5 %    Platelets 171  150 - 400 K/uL   COMPREHENSIVE METABOLIC PANEL     Status: Abnormal   Collection Time   11/17/11  7:55 PM      Component Value Range Comment   Sodium 140  135 - 145 mEq/L    Potassium 4.8  3.5 - 5.1 mEq/L    Chloride 106  96 - 112 mEq/L    CO2 23  19 - 32 mEq/L    Glucose, Bld 76  70 - 99 mg/dL    BUN 21  6 - 23 mg/dL    Creatinine, Ser 1.18  0.50 - 1.35 mg/dL    Calcium 8.8  8.4 - 10.5  mg/dL    Total Protein 6.7  6.0 - 8.3 g/dL    Albumin 3.8  3.5 - 5.2 g/dL    AST 24  0 - 37 U/L    ALT 32  0 - 53 U/L    Alkaline Phosphatase 55  39 - 117 U/L    Total Bilirubin 0.3  0.3 - 1.2 mg/dL    GFR calc non Af Amer 60 (*) >90 mL/min    GFR calc Af Amer 69 (*) >90 mL/min   PROTIME-INR     Status: Normal   Collection Time   11/17/11  7:55 PM      Component Value Range Comment   Prothrombin Time 13.9  11.6 - 15.2 seconds    INR 1.05  0.00 - 1.49   APTT     Status:  Abnormal   Collection Time   11/17/11  7:55 PM      Component Value Range Comment   aPTT 39 (*) 24 - 37 seconds    Ct Head Wo Contrast  11/17/2011  *RADIOLOGY REPORT*  Clinical Data: Left-sided weakness and tingling.  History of ruptured abdominal aortic aneurysm.  CT HEAD WITHOUT CONTRAST  Technique:  Contiguous axial images were obtained from the base of the skull through the vertex without contrast.  Comparison: None.  Findings: No acute intracranial abnormality is present. Specifically, there is no evidence for acute infarct, hemorrhage, mass, hydrocephalus, or extra-axial fluid collection.  The paranasal sinuses and mastoid air cells are clear.  The globes and orbits are intact.  The osseous skull is intact.  IMPRESSION: Negative CT of the head.  Original Report Authenticated By: Resa Miner. MATTERN, M.D.   Mr Brain Wo Contrast  11/17/2011  *RADIOLOGY REPORT*  Clinical Data:  Left-sided weakness and tingling.  MRI HEAD WITHOUT CONTRAST MRA HEAD WITHOUT CONTRAST  Technique: Multiplanar, multiecho pulse sequences of the brain and surrounding structures were obtained according to standard protocol without intravenous contrast.  Angiographic images of the head were obtained using MRA technique without contrast.  Comparison: 11/17/2011 CT.  No comparison MR.  MRI HEAD  Findings:  Small acute non hemorrhagic infarcts involving portions of the right frontal lobe and right occipital lobe.  No intracranial hemorrhage.  Moderate  small vessel disease type changes.  Mild global atrophy without hydrocephalus.  No intracranial mass lesion detected on this unenhanced exam.  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  Please see above.  MRA HEAD  Findings: Marked irregularity of the cavernous segment of the internal carotid artery bilaterally with areas of moderate to marked narrowing and significant irregular dilation.  The changes are suspicious for prominent atherosclerosis with the aneurysmal aspects probably related to the atherosclerotic disease rather than saccular or blister aneurysm.  Beyond the cavernous segment, no medium or large size vessel significant stenosis or occlusion of the anterior circulation is noted.  Right vertebral artery is dominant.  Narrowed irregular small caliber left vertebral artery.  Poor delineation of the left PICA and right AICA.  Artifact proximal basilar artery suspected.  Posterior cerebral artery and superior cerebral artery mild branch vessel irregularity.  IMPRESSION: Marked irregularity of the cavernous segment of the internal carotid artery bilaterally with areas of moderate to marked narrowing and significant irregular dilation.  The changes are suspicious for prominent atherosclerosis with the aneurysmal aspects probably related to the atherosclerotic disease rather than saccular or blister aneurysm.  Narrowed irregular small caliber left vertebral artery.  Critical Value/emergent results were called by telephone at the time of interpretation on 11/17/2011  at 9:30 p.m.  to  Dr. Ferdinand Lango, who verbally acknowledged these results.  Original Report Authenticated By: Doug Sou, M.D.   Mr Mra Head/brain Wo Cm  11/17/2011  *RADIOLOGY REPORT*  Clinical Data:  Left-sided weakness and tingling.  MRI HEAD WITHOUT CONTRAST MRA HEAD WITHOUT CONTRAST  Technique: Multiplanar, multiecho pulse sequences of the brain and surrounding  structures were obtained according to standard protocol without intravenous contrast.  Angiographic images of the head were obtained using MRA technique without contrast.  Comparison: 11/17/2011 CT.  No comparison MR.  MRI HEAD  Findings:  Small acute non hemorrhagic infarcts involving portions of the right frontal lobe and right occipital lobe.  No intracranial hemorrhage.  Moderate small vessel disease type  changes.  Mild global atrophy without hydrocephalus.  No intracranial mass lesion detected on this unenhanced exam.  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  Please see above.  MRA HEAD  Findings: Marked irregularity of the cavernous segment of the internal carotid artery bilaterally with areas of moderate to marked narrowing and significant irregular dilation.  The changes are suspicious for prominent atherosclerosis with the aneurysmal aspects probably related to the atherosclerotic disease rather than saccular or blister aneurysm.  Beyond the cavernous segment, no medium or large size vessel significant stenosis or occlusion of the anterior circulation is noted.  Right vertebral artery is dominant.  Narrowed irregular small caliber left vertebral artery.  Poor delineation of the left PICA and right AICA.  Artifact proximal basilar artery suspected.  Posterior cerebral artery and superior cerebral artery mild branch vessel irregularity.  IMPRESSION: Marked irregularity of the cavernous segment of the internal carotid artery bilaterally with areas of moderate to marked narrowing and significant irregular dilation.  The changes are suspicious for prominent atherosclerosis with the aneurysmal aspects probably related to the atherosclerotic disease rather than saccular or blister aneurysm.  Narrowed irregular small caliber left vertebral artery.  Critical Value/emergent results were called by telephone at the time of interpretation on  11/17/2011  at 9:30 p.m.  to  Dr. Ferdinand Lango, who verbally acknowledged these results.  Original Report Authenticated By: Doug Sou, M.D.      Assessment: Present on Admission:  .CVA (cerebral infarction) .HYPERLIPIDEMIA .HYPERTENSION .Fasting hyperglycemia    Plan:  Admit to Telemetry Bed Ischemic CVA workup, Had CT, MRI/MRA, Carotid US ordered Neuro checks Check Fasting Lipids, and HBAIC, SSI coverage PRN PT, OT evaluations ASA Rx DVT prophylaxis Other plans as per orders.    CODE STATUS:      FULL CODE         Tyja Gortney C 11/17/2011, 10:40 PM

## 2011-11-17 NOTE — ED Provider Notes (Signed)
Patient awakened this morning with numbness and tingling in left arm and left leg. And slight difficulty walking with off balance presently asymptomatic except for mild headache left-sided on exam alert Glasgow Coma 15 moves all refill motor strength 5 over 5 overall cranial nurse 2 through 12 grossly intact DTRs symmetric and bilateral at knee jerk ankle jerk and biceps toes although bilaterally. Plan TIA protocol in [place], MD 11/17/11 1930

## 2011-11-17 NOTE — ED Notes (Signed)
Patient states he woke up at 0600 and noticed he had weakness in his left hand.  He also reports numbness and tingling in the left leg and left side of his face.  He also reports headache.  Patient states his sx lasted several hours.  Patient with ongoing weakness and frontal headache.  Patient denies fall. Patient also complains of pain in the left groin that has resolved

## 2011-11-17 NOTE — ED Provider Notes (Addendum)
History     CSN: [id number]  Arrival date & time 11/17/11  69   First MD Initiated Contact with Patient 11/17/11 1747      Chief Complaint  Patient presents with  . Numbness  . Tingling  . Headache    Patient is a 71 y.o. male presenting with weakness. The history is provided by the patient and the spouse.  Weakness The primary symptoms include headaches (frontal), focal weakness (left face, left hand, left upper leg since wakening up this morning at 6am) and loss of sensation (decreased sensation of left side of face). Primary symptoms do not include seizures, dizziness, fever, nausea or vomiting. The symptoms began 6 to 12 hours ago (6am). The symptoms are unchanged. The neurological symptoms are focal (left upper leg, left hand, and left face). Context: Woke up with symptoms.  The headache began today. The headache developed gradually. The headache is associated with weakness (left hand, left upper leg, left face).  Loss of sensation began 6 - 12 hours ago. The loss of sensation is improving.  Additional symptoms include weakness (left hand, left upper leg, left face). Additional symptoms do not include pain. Associated medical issues comments: HTN, DM, HLD. Procedure history comments: None.    Past Medical History  Diagnosis Date  . Diabetes mellitus   . Hyperlipidemia   . Hypertension     Past Surgical History  Procedure Date  . Carotid endarterectomy   . Testicular cyst   . Abdominal aortic aneurysm repair   . Abdominal aortic aneurysm repair     Family History  Problem Relation Age of Onset  . Parkinsonism Father   . Dementia Father   . Colon cancer Mother     History  Substance Use Topics  . Smoking status: Former Smoker    Quit date: 05/30/1990  . Smokeless tobacco: Not on file  . Alcohol Use: No      Review of Systems  Constitutional: Negative for fever, chills, diaphoresis, activity change and appetite change.  HENT: Negative for neck pain.     Respiratory: Negative for cough, chest tightness, shortness of breath and wheezing.   Cardiovascular: Negative for chest pain, palpitations and leg swelling.  Gastrointestinal: Negative for nausea, vomiting, abdominal pain, diarrhea and constipation.  Skin: Negative for rash and wound.  Neurological: Positive for focal weakness (left face, left hand, left upper leg since wakening up this morning at 6am), weakness (left hand, left upper leg, left face) and headaches (frontal). Negative for dizziness, seizures, syncope, light-headedness and numbness.  Psychiatric/Behavioral: Negative for confusion and agitation. The patient is not nervous/anxious.   All other systems reviewed and are negative.    Allergies  Review of patient's allergies indicates no known allergies.  Home Medications   Current Outpatient Rx  Name Route Sig Dispense Refill  . AMLODIPINE-OLMESARTAN 5-40 MG PO TABS Oral Take 1 tablet by mouth daily.    Marland Kitchen VITAMIN C 500 MG PO CHEW Oral Chew by mouth daily.      . ASPIRIN 81 MG PO CHEW Oral Chew 81 mg by mouth daily.      Marland Kitchen BISOPROLOL-HYDROCHLOROTHIAZIDE 10-6.25 MG PO TABS Oral Take 1 tablet by mouth daily.    Marland Kitchen DICLOFENAC SODIUM 75 MG PO TBEC Oral Take 75 mg by mouth 2 (two) times daily as needed. For pain    . EZETIMIBE-SIMVASTATIN 10-40 MG PO TABS Oral Take 1 tablet by mouth daily.    Marland Kitchen MELATONIN 3 MG PO TABS Oral Take 1 tablet  by mouth at bedtime.      Marland Kitchen NIACIN ER (ANTIHYPERLIPIDEMIC) 500 MG PO TBCR Oral Take 500 mg by mouth daily.      . OMEGA 3 1000 MG PO CAPS Oral Take 2 capsules by mouth daily.      . SAW PALMETTO (SERENOA REPENS) 1000 MG PO CAPS Oral Take by mouth daily.      Marland Kitchen TAMSULOSIN HCL 0.4 MG PO CAPS Oral Take by mouth daily after supper.      Marland Kitchen TIZANIDINE HCL 4 MG PO CAPS Oral Take 4 mg by mouth as needed. For muscle spasm       BP 163/61  Pulse 56  Temp 98.4 F (36.9 C) (Oral)  Resp 22  Ht 5' 9.5" (1.765 m)  Wt 112 lb (50.803 kg)  BMI 16.30 kg/m2   SpO2 96%  Physical Exam  Nursing note and vitals reviewed. Constitutional: He appears well-developed and well-nourished.  HENT:  Head: Normocephalic and atraumatic.  Right Ear: External ear normal.  Left Ear: External ear normal.  Nose: Nose normal.  Mouth/Throat: Oropharynx is clear and moist. No oropharyngeal exudate.  Eyes: Conjunctivae are normal. Pupils are equal, round, and reactive to light.  Neck: Normal range of motion. Neck supple.  Cardiovascular: Normal rate, regular rhythm, normal heart sounds and intact distal pulses.   Pulmonary/Chest: Effort normal and breath sounds normal. No respiratory distress. He has no wheezes. He has no rales. He exhibits no tenderness.  Abdominal: Soft. Bowel sounds are normal. He exhibits no distension and no mass. There is no tenderness. There is no rebound and no guarding.       No palpable pulsatile mass   Musculoskeletal: Normal range of motion. He exhibits no edema and no tenderness.  Neurological: He is alert. He displays normal reflexes. A cranial nerve deficit (decreased sensation to left face) is present. He exhibits normal muscle tone. Coordination normal.  Skin: Skin is warm and dry. No rash noted. No erythema. No pallor.  Psychiatric: He has a normal mood and affect. His behavior is normal. Judgment and thought content normal.    ED Course  Procedures (including critical care time)  Labs Reviewed  CBC - Abnormal; Notable for the following:    RBC 4.01 (*)     Hemoglobin 12.3 (*)     HCT 35.0 (*)     All other components within normal limits  GLUCOSE, CAPILLARY - Abnormal; Notable for the following:    Glucose-Capillary 199 (*)     All other components within normal limits  DIFFERENTIAL  COMPREHENSIVE METABOLIC PANEL   Dg Chest 2 View  11/17/2011  *RADIOLOGY REPORT*  Clinical Data: Numbness and tingling on the left arm, leg and face. Chest aching.  CHEST - 2 VIEW  Comparison: Chest radiograph performed 04/09/2009  Findings:  The lungs are well-aerated and clear.  There is no evidence of focal opacification, pleural effusion or pneumothorax.  The heart is normal in size; the mediastinal contour is within normal limits.  No acute osseous abnormalities are seen.  IMPRESSION: No acute cardiopulmonary process seen.  Original Report Authenticated By: Santa Lighter, M.D.   Ct Head Wo Contrast  11/17/2011  *RADIOLOGY REPORT*  Clinical Data: Left-sided weakness and tingling.  History of ruptured abdominal aortic aneurysm.  CT HEAD WITHOUT CONTRAST  Technique:  Contiguous axial images were obtained from the base of the skull through the vertex without contrast.  Comparison: None.  Findings: No acute intracranial abnormality is present. Specifically, there is no  evidence for acute infarct, hemorrhage, mass, hydrocephalus, or extra-axial fluid collection.  The paranasal sinuses and mastoid air cells are clear.  The globes and orbits are intact.  The osseous skull is intact.  IMPRESSION: Negative CT of the head.  Original Report Authenticated By: Resa Miner. MATTERN, M.D.   Mr Brain Wo Contrast  11/17/2011  *RADIOLOGY REPORT*  Clinical Data:  Left-sided weakness and tingling.  MRI HEAD WITHOUT CONTRAST MRA HEAD WITHOUT CONTRAST  Technique: Multiplanar, multiecho pulse sequences of the brain and surrounding structures were obtained according to standard protocol without intravenous contrast.  Angiographic images of the head were obtained using MRA technique without contrast.  Comparison: 11/17/2011 CT.  No comparison MR.  MRI HEAD  Findings:  Small acute non hemorrhagic infarcts involving portions of the right frontal lobe and right occipital lobe.  No intracranial hemorrhage.  Moderate small vessel disease type changes.  Mild global atrophy without hydrocephalus.  No intracranial mass lesion detected on this unenhanced exam.  IMPRESSION: Small acute non hemorrhagic infarcts involving portions of the right frontal lobe and right occipital  lobe.  Please see above.  MRA HEAD  Findings: Marked irregularity of the cavernous segment of the internal carotid artery bilaterally with areas of moderate to marked narrowing and significant irregular dilation.  The changes are suspicious for prominent atherosclerosis with the aneurysmal aspects probably related to the atherosclerotic disease rather than saccular or blister aneurysm.  Beyond the cavernous segment, no medium or large size vessel significant stenosis or occlusion of the anterior circulation is noted.  Right vertebral artery is dominant.  Narrowed irregular small caliber left vertebral artery.  Poor delineation of the left PICA and right AICA.  Artifact proximal basilar artery suspected.  Posterior cerebral artery and superior cerebral artery mild branch vessel irregularity.  IMPRESSION: Marked irregularity of the cavernous segment of the internal carotid artery bilaterally with areas of moderate to marked narrowing and significant irregular dilation.  The changes are suspicious for prominent atherosclerosis with the aneurysmal aspects probably related to the atherosclerotic disease rather than saccular or blister aneurysm.  Narrowed irregular small caliber left vertebral artery.  Critical Value/emergent results were called by telephone at the time of interpretation on 11/17/2011  at 9:30 p.m.  to  Dr. Ferdinand Lango, who verbally acknowledged these results.  Original Report Authenticated By: Doug Sou, M.D.   Mr Mra Head/brain Wo Cm  11/17/2011  *RADIOLOGY REPORT*  Clinical Data:  Left-sided weakness and tingling.  MRI HEAD WITHOUT CONTRAST MRA HEAD WITHOUT CONTRAST  Technique: Multiplanar, multiecho pulse sequences of the brain and surrounding structures were obtained according to standard protocol without intravenous contrast.  Angiographic images of the head were obtained using MRA technique without contrast.  Comparison: 11/17/2011 CT.  No comparison MR.  MRI HEAD  Findings:  Small acute non  hemorrhagic infarcts involving portions of the right frontal lobe and right occipital lobe.  No intracranial hemorrhage.  Moderate small vessel disease type changes.  Mild global atrophy without hydrocephalus.  No intracranial mass lesion detected on this unenhanced exam.  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  Please see above.  MRA HEAD  Findings: Marked irregularity of the cavernous segment of the internal carotid artery bilaterally with areas of moderate to marked narrowing and significant irregular dilation.  The changes are suspicious for prominent atherosclerosis with the aneurysmal aspects probably related to the atherosclerotic disease rather than saccular or blister aneurysm.  Beyond the  cavernous segment, no medium or large size vessel significant stenosis or occlusion of the anterior circulation is noted.  Right vertebral artery is dominant.  Narrowed irregular small caliber left vertebral artery.  Poor delineation of the left PICA and right AICA.  Artifact proximal basilar artery suspected.  Posterior cerebral artery and superior cerebral artery mild branch vessel irregularity.  IMPRESSION: Marked irregularity of the cavernous segment of the internal carotid artery bilaterally with areas of moderate to marked narrowing and significant irregular dilation.  The changes are suspicious for prominent atherosclerosis with the aneurysmal aspects probably related to the atherosclerotic disease rather than saccular or blister aneurysm.  Narrowed irregular small caliber left vertebral artery.  Critical Value/emergent results were called by telephone at the time of interpretation on 11/17/2011  at 9:30 p.m.  to  Dr. Ferdinand Lango, who verbally acknowledged these results.  Original Report Authenticated By: Doug Sou, M.D.    Date: 11/18/2011  Rate: 57 bpm  Rhythm: sinus bradycardia  QRS Axis: normal  Intervals: normal  ST/T Wave  abnormalities: normal  Conduction Disutrbances:none  Narrative Interpretation:   Old EKG Reviewed: none available   Cerebrovascular Accident Weakness   MDM  71 yo M presents with frontal headache and weakness of the left arm, left leg, and left face since awakening this morning at 6 am. Pt states symptoms have been gradually improving since that time, but called his surgeon who recommended he come to the ED. Pt does not meet Code Stroke criteria and is not a candidate for TPA as symptoms have been improving. Pt has hx of AAA but denies abdominal pain. Pt normotensive without pulsatile abdominal mass. Head CT obtained and negative for evidence of CVA. Patient placed on TIA protocol and plans to obtain brain MRI, echo, and carotid U/S. Patient moved to CDU; care transferred to [facility], who is apprised of course and plan.  I received a call from radiology indicating that the brain MRI obtained on Mr. Miesse indicated infarctions of the occipital and frontal lobes on the right, which would correspond with his symptoms. Dr. Arnoldo Morale was consulted for admission for CVA.           Marco Collie, MD 11/18/11 Laureen Abrahams  Marco Collie, MD 11/18/11 [id number]

## 2011-11-17 NOTE — Telephone Encounter (Signed)
Phone call from pt. Stated he had episode of left hand "numbness" this morning when he was petting his cat.  Further explained "my hand wasn't completely numb, but just didn't feel like it was working right".   C/o headache; stated "first it felt like there was pressure on top of my head, then felt like it moved to the front of my head".  Also c/o a "twitch or ache" in my left side.  Also stated he had episode where his (L) eye was "swirly".  Stated was feeling "a little weak all over", and had some problems w/ balance.  Last carotid duplex was 12/2010.  Discussed w/ Dr. Oneida Alar and advised pt. to go to the ER.  Pt. was advised to go directly to [facility] ER.  Verb. Understanding.

## 2011-11-17 NOTE — ED Provider Notes (Signed)
8:53 PM Assumed care of patient in the CDU.  Patient is on TIA protocol.  Patient with history of hyperlipidemia, HTN, and DM comes in today with a chief complaint of left sided facial weakness and left lower extremity weakness that occurred at 6:00 AM this morning.  His symptoms have since improved.  Labs unremarkable.  Head CT negative.  Plan is for patient to have MRI, MRA, 2D Echocardiogram, and Carotid Dopplers.  Reassessed patient.  Patient reports that his symptoms have improved.  No acute distress.  Heart RRR, Lungs CTAB, CN II-XII grossly intact, grip strength 5/5 bilaterally, lower extremity muscle strength 5/5 bilaterally.  9:37 PM Results of the MRI showed an acute infarct involving the right frontal lobe and right occipital lobe. Dr. Ferdinand Lango was called with the results and has already put in a page to the Hospitalist for admission.    Sherlyn Lees [address], PA-C 11/18/11 [phone]

## 2011-11-18 ENCOUNTER — Inpatient Hospital Stay (HOSPITAL_COMMUNITY): Payer: Managed Care, Other (non HMO)

## 2011-11-18 ENCOUNTER — Encounter (HOSPITAL_COMMUNITY): Payer: Self-pay | Admitting: *Deleted

## 2011-11-18 DIAGNOSIS — I6340 Cerebral infarction due to embolism of unspecified cerebral artery: Secondary | ICD-10-CM

## 2011-11-18 DIAGNOSIS — E782 Mixed hyperlipidemia: Secondary | ICD-10-CM

## 2011-11-18 DIAGNOSIS — I10 Essential (primary) hypertension: Secondary | ICD-10-CM

## 2011-11-18 DIAGNOSIS — G8190 Hemiplegia, unspecified affecting unspecified side: Secondary | ICD-10-CM

## 2011-11-18 DIAGNOSIS — I517 Cardiomegaly: Secondary | ICD-10-CM

## 2011-11-18 LAB — LIPID PANEL
Cholesterol: 177 mg/dL (ref 0–200)
HDL: 33 mg/dL — ABNORMAL LOW (ref >39)
Total CHOL/HDL Ratio: 5.4 RATIO
VLDL: 59 mg/dL — ABNORMAL HIGH (ref 0–40)

## 2011-11-18 LAB — GLUCOSE, CAPILLARY
Glucose-Capillary: 102 mg/dL — ABNORMAL HIGH (ref 70–99)
Glucose-Capillary: 129 mg/dL — ABNORMAL HIGH (ref 70–99)
Glucose-Capillary: 121 mg/dL — ABNORMAL HIGH (ref 70–99)

## 2011-11-18 MED ORDER — IOHEXOL 350 MG/ML SOLN
50.0000 mL | Freq: Once | INTRAVENOUS | Status: AC | PRN
  Administered 2011-11-18: 50 mL via INTRAVENOUS

## 2011-11-18 MED ORDER — CLOPIDOGREL BISULFATE 75 MG PO TABS
75.0000 mg | ORAL_TABLET | Freq: Every day | ORAL | Status: DC
  Administered 2011-11-19: 75 mg via ORAL
  Filled 2011-11-18: qty 1

## 2011-11-18 MED ORDER — IRBESARTAN 300 MG PO TABS
300.0000 mg | ORAL_TABLET | Freq: Every day | ORAL | Status: DC
  Administered 2011-11-18 – 2011-11-19 (×2): 300 mg via ORAL
  Filled 2011-11-18 (×2): qty 1

## 2011-11-18 MED ORDER — AMLODIPINE BESYLATE 5 MG PO TABS
5.0000 mg | ORAL_TABLET | Freq: Every day | ORAL | Status: DC
  Administered 2011-11-18 – 2011-11-19 (×2): 5 mg via ORAL
  Filled 2011-11-18 (×2): qty 1

## 2011-11-18 NOTE — Evaluation (Signed)
Physical Therapy Evaluation Patient Details Name: Kevin [name] MRN: [id number] DOB: Nov 01, 1940 Today's Date: 11/18/2011 Time: [id number] PT Time Calculation (min): 19 min  PT Assessment / Plan / Recommendation Clinical Impression  Pt with a medical diagnosis of CVA. NIH 0 at this time. Pt at baseline functional level, no balance or strength deficits. No acute PT needs, will not follow.    PT Assessment  Patent does not need any further PT services    Follow Up Recommendations  No PT follow up       lEquipment Recommendations  None recommended by SLP                   Mobility  Bed Mobility Bed Mobility: Supine to Sit;Sitting - Scoot to Edge of Bed Supine to Sit: 7: Independent Sitting - Scoot to Edge of Bed: 7: Independent Transfers Transfers: Sit to Stand;Stand to Sit Sit to Stand: 7: Independent;From bed;Without upper extremity assist Stand to Sit: 7: Independent;To chair/3-in-1;Without upper extremity assist Ambulation/Gait Ambulation/Gait Assistance: 7: Independent Ambulation Distance (Feet): 300 Feet Assistive device: None Ambulation/Gait Assistance Details: see balance for high level ambulation Gait Pattern: Within Functional Limits Modified Rankin (Stroke Patients Only) Pre-Morbid Rankin Score: No symptoms Modified Rankin: No significant disability      Visit Information  Last PT Received On: 11/18/11 Assistance Needed: +1 PT/OT Co-Evaluation/Treatment: Yes    Subjective Data      Prior Functioning  Home Living Lives With: Spouse Available Help at Discharge: Family;Available 24 hours/day Type of Home: House Home Access: Stairs to enter [employer] of Steps: 1 Entrance Stairs-Rails: None Home Layout: One level Bathroom Shower/Tub: Walk-in shower;Door Bathroom Toilet: Handicapped height Bathroom Accessibility: Yes How Accessible: Accessible via walker Home Adaptive Equipment: Grab bars around toilet;Grab bars in shower;Shower chair  with back Prior Function Level of Independence: Independent Able to Take Stairs?: Yes Driving: Yes 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 Communication Communication: No difficulties Dominant Hand: Right    Cognition  Overall Cognitive Status: Appears within functional limits for tasks assessed/performed Arousal/Alertness: Awake/alert Orientation Level: Appears intact for tasks assessed Behavior During Session: [facility] for tasks performed    Extremity/Trunk Assessment Right Upper Extremity Assessment RUE ROM/Strength/Tone: Within functional levels Left Upper Extremity Assessment LUE ROM/Strength/Tone: Within functional levels Right Lower Extremity Assessment RLE ROM/Strength/Tone: Within functional levels RLE Sensation: WFL - Light Touch RLE Coordination: WFL - gross/fine motor Left Lower Extremity Assessment LLE ROM/Strength/Tone: Within functional levels LLE Sensation: WFL - Light Touch LLE Coordination: WFL - gross/fine motor   Balance Balance Balance Assessed: Yes Dynamic Standing Balance Dynamic Standing - Balance Support: No upper extremity supported Dynamic Standing - Level of Assistance: 6: Modified independent (Device/Increase time) Dynamic Standing - Comments: Pt able to ambulate tandem as well as backwards walking. No difficulties. Change in speed and sudden stops, no difficulties Standardized Balance Assessment Standardized Balance Assessment: Dynamic Gait Index Dynamic Gait Index Level Surface: Normal Change in Gait Speed: Normal Gait with Horizontal Head Turns: Normal Gait with Vertical Head Turns: Normal Gait and Pivot Turn: Normal Step Over Obstacle: Normal Step Around Obstacles: Normal Steps: Normal Total Score: 24   End of Session PT - End of Session Equipment Utilized During Treatment: Gait belt Activity Tolerance: Patient tolerated treatment well Patient left: in bed;with call bell/phone within reach;with family/visitor  present Nurse Communication: Mobility status   Ambrose Finland 11/18/2011, 4:15 PM  11/18/2011 Ambrose Finland DPT PAGER: [phone] OFFICE: [phone]

## 2011-11-18 NOTE — Consult Note (Signed)
[facility] CONSULT NOTE     Reason for Consult: stroke    HPI:    Kevin [name] is an 71 y.o. male with history of left CEA, AAA s/p repair, HTN, hyperlipidemia, and DM.  Patient woke up in the morning at 6:30 and noted left hand, arm and leg decreased sensation with component of left eye blurriness.  This was persistent through the morning.  Patient made a call to his vascular surgeon who recommended he be seen in ED. Initial CT was negative however MRI showed small acute non hemorrhagic infarcts involving portions of the right frontal lobe and right occipital lobe. Neurology was consulted to further evaluate.   Past Medical History  Diagnosis Date  . Diabetes mellitus   . Hyperlipidemia   . Hypertension     Past Surgical History  Procedure Date  . Carotid endarterectomy   . Testicular cyst   . Abdominal aortic aneurysm repair   . Abdominal aortic aneurysm repair     Family History  Problem Relation Age of Onset  . Parkinsonism Father   . Dementia Father   . Colon cancer Mother     Social History:  reports that he quit smoking about 21 years ago. He does not have any smokeless tobacco history on file. He reports that he does not drink alcohol or use illicit drugs.  No Known Allergies  Medications:    Prior to Admission:  Prescriptions prior to admission  Medication Sig Dispense Refill  . amLODipine-olmesartan (AZOR) 5-40 MG per tablet Take 1 tablet by mouth daily.      . Ascorbic Acid (VITAMIN C) 500 MG CHEW Chew by mouth daily.        Marland Kitchen aspirin 81 MG chewable tablet Chew 81 mg by mouth daily.        . bisoprolol-hydrochlorothiazide (ZIAC) 10-6.25 MG per tablet Take 1 tablet by mouth daily.      . diclofenac (VOLTAREN) 75 MG EC tablet Take 75 mg by mouth 2 (two) times daily as needed. For pain      . ezetimibe-simvastatin (VYTORIN) 10-40 MG per tablet Take 1 tablet by mouth daily.      . Melatonin 3 MG TABS Take 1 tablet by mouth at  bedtime.        . niacin (NIASPAN) 500 MG CR tablet Take 500 mg by mouth daily.        . OMEGA 3 1000 MG CAPS Take 2 capsules by mouth daily.        . Saw Palmetto, Serenoa repens, 1000 MG CAPS Take by mouth daily.        . Tamsulosin HCl (FLOMAX) 0.4 MG CAPS Take by mouth daily after supper.        Marland Kitchen tiZANidine (ZANAFLEX) 4 MG capsule Take 4 mg by mouth as needed. For muscle spasm        Scheduled:   . amLODipine  5 mg Oral Daily   And  . irbesartan  300 mg Oral Daily  . aspirin  324 mg Oral Once  . aspirin  300 mg Rectal Daily   Or  . aspirin  325 mg Oral Daily  . bisoprolol-hydrochlorothiazide  1 tablet Oral Daily  . enoxaparin  40 mg Subcutaneous Q24H  . ezetimibe-simvastatin  1 tablet Oral q1800  . insulin aspart  0-9 Units Subcutaneous Q4H  . niacin  500 mg Oral Daily  . omega-3 acid ethyl esters  2 g Oral Q breakfast  . Tamsulosin HCl  0.4 mg Oral QPC supper  . DISCONTD: amLODipine-olmesartan  1 tablet Oral Daily    Review of Systems - General ROS: negative for - chills, fatigue, fever or hot flashes Hematological and Lymphatic ROS: negative for - bruising, fatigue, jaundice or pallor Endocrine ROS: negative for - hair pattern changes, hot flashes, mood swings or skin changes Respiratory ROS: negative for - cough, hemoptysis, orthopnea or wheezing Cardiovascular ROS: negative for - dyspnea on exertion, orthopnea, palpitations or shortness of breath Gastrointestinal ROS: negative for - abdominal pain, appetite loss, blood in stools, diarrhea or hematemesis Musculoskeletal ROS: negative for - joint pain, joint stiffness, joint swelling . Positive for back pain and muscle pain Neurological ROS: positive for - numbness/tingling and visual changes Dermatological ROS: negative for dry skin, pruritus and rash   Blood pressure 161/74, pulse 58, temperature 98.5 F (36.9 C), temperature source Oral, resp. rate 18, height 5' 9.5" (1.765 m), weight 50.803 kg (112 lb), SpO2  95.00%.   Neurologic Examination:  Mental Status: Alert, oriented, thought content appropriate.  Speech fluent without evidence of aphasia. Able to follow 3 step commands without difficulty. Cranial Nerves: II-Visual fields grossly intact. III/IV/VI-Extraocular movements intact.  Pupils reactive bilaterally. V/VII-Smile symmetric VIII-grossly intact IX/X-normal gag XI-bilateral shoulder shrug XII-midline tongue extension Motor: 5/5 bilaterally with normal tone and bulk Sensory: Pinprick and light touch intact throughout, bilaterally Deep Tendon Reflexes: 2+ and symmetric throughout Plantars downgoing bilaterally Cerebellar: Normal finger-to-nose, normal rapid alternating movements and normal heel-to-shin test.       Lab Results  Component Value Date/Time   CHOL 177 11/17/2011  7:59 PM    Results for orders placed during the hospital encounter of 11/17/11 (from the past 48 hour(s))  GLUCOSE, CAPILLARY     Status: Abnormal   Collection Time   11/17/11  4:16 PM      Component Value Range Comment   Glucose-Capillary 199 (*) 70 - 99 mg/dL    Comment 1 Documented in Chart      Comment 2 Notify RN     COMPREHENSIVE METABOLIC PANEL     Status: Abnormal   Collection Time   11/17/11  5:14 PM      Component Value Range Comment   Sodium 140  135 - 145 mEq/L    Potassium 4.2  3.5 - 5.1 mEq/L    Chloride 105  96 - 112 mEq/L    CO2 24  19 - 32 mEq/L    Glucose, Bld 132 (*) 70 - 99 mg/dL    BUN 22  6 - 23 mg/dL    Creatinine, Ser 1.22  0.50 - 1.35 mg/dL    Calcium 9.1  8.4 - 10.5 mg/dL    Total Protein 7.1  6.0 - 8.3 g/dL    Albumin 3.9  3.5 - 5.2 g/dL    AST 24  0 - 37 U/L    ALT 33  0 - 53 U/L    Alkaline Phosphatase 58  39 - 117 U/L    Total Bilirubin 0.3  0.3 - 1.2 mg/dL    GFR calc non Af Amer 57 (*) >90 mL/min    GFR calc Af Amer 67 (*) >90 mL/min   CBC     Status: Abnormal   Collection Time   11/17/11  5:14 PM      Component Value Range Comment   WBC 8.8  4.0 - 10.5 K/uL     RBC 4.01 (*) 4.22 - 5.81 MIL/uL    Hemoglobin 12.3 (*) 13.0 - 17.0 g/dL    HCT 35.0 (*) 39.0 - 52.0 %    MCV 87.3  78.0 - 100.0 fL    MCH 30.7  26.0 - 34.0 pg    MCHC 35.1  30.0 - 36.0 g/dL    RDW 12.8  11.5 - 15.5 %    Platelets 161  150 - 400 K/uL   DIFFERENTIAL     Status: Normal   Collection Time   11/17/11  5:14 PM      Component Value Range Comment   Neutrophils Relative 66  43 - 77 %    Neutro Abs 5.7  1.7 - 7.7 K/uL    Lymphocytes Relative 23  12 - 46 %    Lymphs Abs 2.0  0.7 - 4.0 K/uL    Monocytes Relative 7  3 - 12 %    Monocytes Absolute 0.6  0.1 - 1.0 K/uL    Eosinophils Relative 4  0 - 5 %    Eosinophils Absolute 0.3  0.0 - 0.7 K/uL    Basophils Relative 1  0 - 1 %    Basophils Absolute 0.1  0.0 - 0.1 K/uL   CBC     Status: Abnormal   Collection Time   11/17/11  7:55 PM      Component Value Range Comment   WBC 8.6  4.0 - 10.5 K/uL    RBC 3.97 (*) 4.22 - 5.81 MIL/uL    Hemoglobin 11.9 (*) 13.0 - 17.0 g/dL    HCT 34.4 (*) 39.0 - 52.0 %    MCV 86.6  78.0 - 100.0 fL    MCH 30.0  26.0 - 34.0 pg    MCHC 34.6  30.0 - 36.0 g/dL    RDW 12.9  11.5 - 15.5 %    Platelets 171  150 - 400 K/uL   COMPREHENSIVE METABOLIC PANEL     Status: Abnormal   Collection Time   11/17/11  7:55 PM      Component Value Range Comment   Sodium 140  135 - 145 mEq/L    Potassium 4.8  3.5 - 5.1 mEq/L    Chloride 106  96 - 112 mEq/L    CO2 23  19 - 32 mEq/L    Glucose, Bld 76  70 - 99 mg/dL    BUN 21  6 - 23 mg/dL    Creatinine, Ser 1.18  0.50 - 1.35 mg/dL    Calcium 8.8  8.4 - 10.5 mg/dL    Total Protein 6.7  6.0 - 8.3 g/dL    Albumin 3.8  3.5 - 5.2 g/dL    AST 24  0 - 37 U/L    ALT 32  0 - 53 U/L    Alkaline Phosphatase 55  39 - 117 U/L    Total Bilirubin 0.3  0.3 - 1.2 mg/dL    GFR calc non Af Amer 60 (*) >90 mL/min    GFR calc Af Amer 69 (*) >90 mL/min   PROTIME-INR     Status: Normal   Collection Time   11/17/11  7:55 PM      Component Value Range Comment   Prothrombin Time  13.9  11.6 - 15.2 seconds    INR 1.05  0.00 - 1.49   APTT     Status: Abnormal   Collection Time   11/17/11  7:55 PM      Component Value Range Comment   aPTT 39 (*) 24 - 37 seconds   HEMOGLOBIN A1C     Status: Abnormal   Collection Time   11/17/11  7:55 PM      Component Value Range Comment   Hemoglobin A1C 6.2 (*) <5.7 %    Mean Plasma Glucose 131 (*) <117 mg/dL   LIPID PANEL     Status: Abnormal   Collection Time   11/17/11  7:59 PM      Component Value Range Comment   Cholesterol 177  0 - 200 mg/dL    Triglycerides 295 (*) <150 mg/dL    HDL 33 (*) >39 mg/dL    Total CHOL/HDL Ratio 5.4      VLDL 59 (*) 0 - 40 mg/dL    LDL Cholesterol 85  0 - 99 mg/dL   GLUCOSE, CAPILLARY     Status: Abnormal   Collection Time   11/18/11  2:05 AM      Component Value Range Comment   Glucose-Capillary 102 (*) 70 - 99 mg/dL    Comment 1 Documented in Chart      Comment 2 Notify RN     GLUCOSE, CAPILLARY     Status: Abnormal   Collection Time   11/18/11  4:06 AM      Component Value Range Comment   Glucose-Capillary 102 (*) 70 - 99 mg/dL    Comment 1 Documented in Chart      Comment 2 Notify RN     GLUCOSE, CAPILLARY     Status: Abnormal   Collection Time   11/18/11  8:12 AM      Component Value Range Comment   Glucose-Capillary 129 (*) 70 - 99 mg/dL    Comment 1 Documented in Chart      Comment 2 Notify RN       Dg Chest 2 View  11/17/2011  *RADIOLOGY REPORT*  Clinical Data: Numbness and tingling on the left arm, leg and face. Chest aching.  CHEST - 2 VIEW  Comparison: Chest radiograph performed 04/09/2009  Findings: The lungs are well-aerated and clear.  There is no evidence of focal opacification, pleural effusion or pneumothorax.  The heart is normal in size; the mediastinal contour is within normal limits.  No acute osseous abnormalities are seen.  IMPRESSION: No acute cardiopulmonary process seen.  Original Report Authenticated By: Santa Lighter, M.D.   Ct Head Wo Contrast  11/17/2011   *RADIOLOGY REPORT*  Clinical Data: Left-sided weakness and tingling.  History of ruptured abdominal aortic aneurysm.  CT HEAD WITHOUT CONTRAST  Technique:  Contiguous axial images were obtained from the base of the skull through the vertex without contrast.  Comparison: None.  Findings: No acute intracranial abnormality is present. Specifically, there is no evidence for acute infarct, hemorrhage, mass, hydrocephalus, or extra-axial fluid collection.  The paranasal sinuses and mastoid air cells are clear.  The globes and orbits are intact.  The osseous skull is intact.  IMPRESSION: Negative CT of the head.  Original Report Authenticated By: Resa Miner. MATTERN, M.D.   Mr Brain Wo Contrast  11/17/2011  *RADIOLOGY REPORT*  Clinical Data:  Left-sided weakness and tingling.  MRI HEAD WITHOUT CONTRAST MRA HEAD WITHOUT CONTRAST  Technique: Multiplanar, multiecho pulse sequences of the brain and surrounding structures were obtained according to standard protocol without intravenous contrast.  Angiographic images of the head were obtained using MRA technique without contrast.  Comparison: 11/17/2011 CT.  No  comparison MR.  MRI HEAD  Findings:  Small acute non hemorrhagic infarcts involving portions of the right frontal lobe and right occipital lobe.  No intracranial hemorrhage.  Moderate small vessel disease type changes.  Mild global atrophy without hydrocephalus.  No intracranial mass lesion detected on this unenhanced exam.  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  Please see above.  MRA HEAD  Findings: Marked irregularity of the cavernous segment of the internal carotid artery bilaterally with areas of moderate to marked narrowing and significant irregular dilation.  The changes are suspicious for prominent atherosclerosis with the aneurysmal aspects probably related to the atherosclerotic disease rather than saccular or blister  aneurysm.  Beyond the cavernous segment, no medium or large size vessel significant stenosis or occlusion of the anterior circulation is noted.  Right vertebral artery is dominant.  Narrowed irregular small caliber left vertebral artery.  Poor delineation of the left PICA and right AICA.  Artifact proximal basilar artery suspected.  Posterior cerebral artery and superior cerebral artery mild branch vessel irregularity.  IMPRESSION: Marked irregularity of the cavernous segment of the internal carotid artery bilaterally with areas of moderate to marked narrowing and significant irregular dilation.  The changes are suspicious for prominent atherosclerosis with the aneurysmal aspects probably related to the atherosclerotic disease rather than saccular or blister aneurysm.  Narrowed irregular small caliber left vertebral artery.  Critical Value/emergent results were called by telephone at the time of interpretation on 11/17/2011  at 9:30 p.m.  to  Dr. Ferdinand Lango, who verbally acknowledged these results.  Original Report Authenticated By: Doug Sou, M.D.   Mr Mra Head/brain Wo Cm  11/17/2011  *RADIOLOGY REPORT*  Clinical Data:  Left-sided weakness and tingling.  MRI HEAD WITHOUT CONTRAST MRA HEAD WITHOUT CONTRAST  Technique: Multiplanar, multiecho pulse sequences of the brain and surrounding structures were obtained according to standard protocol without intravenous contrast.  Angiographic images of the head were obtained using MRA technique without contrast.  Comparison: 11/17/2011 CT.  No comparison MR.  MRI HEAD  Findings:  Small acute non hemorrhagic infarcts involving portions of the right frontal lobe and right occipital lobe.  No intracranial hemorrhage.  Moderate small vessel disease type changes.  Mild global atrophy without hydrocephalus.  No intracranial mass lesion detected on this unenhanced exam.  IMPRESSION: Small acute non hemorrhagic infarcts involving portions of the right frontal lobe and right  occipital lobe.  Please see above.  MRA HEAD  Findings: Marked irregularity of the cavernous segment of the internal carotid artery bilaterally with areas of moderate to marked narrowing and significant irregular dilation.  The changes are suspicious for prominent atherosclerosis with the aneurysmal aspects probably related to the atherosclerotic disease rather than saccular or blister aneurysm.  Beyond the cavernous segment, no medium or large size vessel significant stenosis or occlusion of the anterior circulation is noted.  Right vertebral artery is dominant.  Narrowed irregular small caliber left vertebral artery.  Poor delineation of the left PICA and right AICA.  Artifact proximal basilar artery suspected.  Posterior cerebral artery and superior cerebral artery mild branch vessel irregularity.  IMPRESSION: Marked irregularity of the cavernous segment of the internal carotid artery bilaterally with areas of moderate to marked narrowing and significant irregular dilation.  The changes are suspicious for prominent atherosclerosis with the aneurysmal aspects probably related to the atherosclerotic disease rather than saccular or blister aneurysm.  Narrowed irregular small caliber left vertebral artery.  Critical Value/emergent results were called by telephone at the time of interpretation on 11/17/2011  at 9:30 p.m.  to  Dr. Ferdinand Lango, who verbally acknowledged these results.  Original Report Authenticated By: Doug Sou, M.D.   Carotid Doppler 12/2009 IMPRESSION:  1. 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  2. Left internal carotid artery shows no evidence of hemodynamically  significant stenosis, less than 40%.  3. This appears fairly stable as compared with previous study   Assessment/Plan:   70 YO male with history of left CEA, AAA s/p repair, HTN, hyperlipidemia, and DM with new  small acute non hemorrhagic infarcts  involving portions of the right frontal lobe and right occipital lobe.  Recommend: 1) Continue with 2 D echo  2) CTA neck 3) Continue with tighter control of BG with goal HbA1C <5.4 4) switch patient from ASA to Plavix.   5) Neuro checks 6) Telemetry monitoring  Etta Quill PA-C [facility] [phone]  11/18/2011, 8:59 AM    Patient seen and examined. I agree with the above.  Alexis Goodell, MD [facility] [phone]  11/18/2011  1:41 PM

## 2011-11-18 NOTE — Evaluation (Signed)
Speech Language Pathology Evaluation Patient Details Name: Kevin [name] MRN: [id number] DOB: 1941/05/09 Today's Date: 11/18/2011 Time: [id number] SLP Time Calculation (min): 14 min  Problem List:  Patient Active Problem List  Diagnosis  . Fasting hyperglycemia  . HYPERLIPIDEMIA  . UNSPECIFIED ANEMIA  . TREMOR, ESSENTIAL  . MORTON'S NEUROMA, RIGHT  . CONDUCTIVE HEARING LOSS BILATERAL  . HYPERTENSION  . URINARY OBSTRUCTION UNSPECIFIED  . ACTINIC KERATOSIS, EAR, LEFT  . 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 KNEE PAIN, RIGHT  . THORACIC/LUMBOSACRAL NEURITIS/RADICULITIS UNSPEC  . BURSITIS, HIP  . ABDOMINAL AORTIC ANEURYSM REPAIR, HX OF  . CVA (cerebral infarction)   Past Medical History:  Past Medical History  Diagnosis Date  . Diabetes mellitus   . Hyperlipidemia   . Hypertension    Past Surgical History:  Past Surgical History  Procedure Date  . Carotid endarterectomy   . Testicular cyst   . Abdominal aortic aneurysm repair   . Abdominal aortic aneurysm repair    HPI:  71 y.o. male admitted on 6/20 after acute onset of left sided weakness and numbness, which has resolved.  MRI showed small acute non hemorrhagic infarcts involving portions of the   Assessment / Plan / Recommendation Clinical Impression  Demonstrates functional cognitive-linguistic abilities without any identified deficits.  All education completed with the patient and his wife.    SLP Assessment  Patient does not need any further Speech Language Pathology Services    Follow Up Recommendations  None    Pertinent Vitals/Pain n/a    SLP Evaluation Prior Functioning  Cognitive/Linguistic Baseline: Within functional limits Type of Home: House Lives With: Spouse Available Help at Discharge: Family;Available 24 hours/day Vocation: Full time employment   Cognition  Overall Cognitive Status: Appears within functional limits for tasks assessed Arousal/Alertness: Awake/alert Orientation  Level: Oriented X4 Attention: Alternating Alternating Attention: Appears intact Memory: Appears intact Awareness: Appears intact Problem Solving: Appears intact Safety/Judgment: Appears intact    Comprehension  Auditory Comprehension Overall Auditory Comprehension: Appears within functional limits for tasks assessed Reading Comprehension Reading Status: Within funtional limits    Expression Expression Primary Mode of Expression: Verbal Verbal Expression Overall Verbal Expression: Appears within functional limits for tasks assessed Written Expression Dominant Hand: Right Written Expression: Within Functional Limits   Oral / Motor Oral Motor/Sensory Function Overall Oral Motor/Sensory Function: Appears within functional limits for tasks assessed Motor Speech Overall Motor Speech: Appears within functional limits for tasks assessed     Wyline Copas, M.S.,CCC-SLP Pager [phone] 11/18/2011, 2:53 PM

## 2011-11-18 NOTE — Progress Notes (Signed)
  Echocardiogram 2D Echocardiogram has been performed.  Kevin [name] 11/18/2011, 11:24 AM

## 2011-11-18 NOTE — Progress Notes (Signed)
PCP: Georgetta Haber, MD  Brief HPI:  IZAIYAH HOFLER is an 71 y.o. male who began to have left facial weakness and Left hand numbness and left lateral thigh numbness that was noticed upon awakening at 6 AM on day of admission. He reported also having a left sided headache but denied any chest pain, or SOB or nausea or vomiting or fevers or chills. He denied having any difficulty with speech or swallowing. He denied having seizures or syncope associated with the event. He reported taking a nap but awakened and the symptoms had not resolved. So his wife insisted that he call the doctor so he called his doctor and was advised to go to the hospital right away. 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   Past medical history:  Past Medical History  Diagnosis Date  . Diabetes mellitus   . Hyperlipidemia   . Hypertension     Consultants: Neurology  Procedures: None  Subjective: Patient wants to go home. He says his symptoms have resolved. Denies CP or SOB. Has a mild headache.  Objective: Vital signs in last 24 hours: Temp:  [98.4 F (36.9 C)-98.9 F (37.2 C)] 98.5 F (36.9 C) (06/21 0810) Pulse Rate:  [52-58] 58  (06/21 0810) Resp:  [10-22] 18  (06/21 0810) BP: (138-165)/(53-83) 161/74 mmHg (06/21 0810) SpO2:  [95 %-99 %] 95 % (06/21 0810) Weight:  [50.803 kg (112 lb)] 50.803 kg (112 lb) (06/20 1609) Weight change:  Last BM Date: 11/17/11  Intake/Output from previous day:   Intake/Output this shift:    General appearance: alert, cooperative, appears stated age and no distress Head: Normocephalic, without obvious abnormality, atraumatic Resp: clear to auscultation bilaterally Cardio: regular rate and rhythm, S1, S2 normal, no murmur, click, rub or gallop GI:  soft, non-tender; bowel sounds normal; no masses,  no organomegaly Neurologic: Cranial nerves are intact. Motor strength is equal bilaterally. Cerebellar signs are normal. Gait was normal.  Lab Results:  [facility] 11/17/11 1955 11/17/11 1714  WBC 8.6 8.8  HGB 11.9* 12.3*  HCT 34.4* 35.0*  PLT 171 161   BMET  Basename 11/17/11 1955 11/17/11 1714  NA 140 140  K 4.8 4.2  CL 106 105  CO2 23 24  GLUCOSE 76 132*  BUN 21 22  CREATININE 1.18 1.22  CALCIUM 8.8 9.1  ALT 32 33    Studies/Results: Dg Chest 2 View  11/17/2011  *RADIOLOGY REPORT*  Clinical Data: Numbness and tingling on the left arm, leg and face. Chest aching.  CHEST - 2 VIEW  Comparison: Chest radiograph performed 04/09/2009  Findings: The lungs are well-aerated and clear.  There is no evidence of focal opacification, pleural effusion or pneumothorax.  The heart is normal in size; the mediastinal contour is within normal limits.  No acute osseous abnormalities are seen.  IMPRESSION: No acute cardiopulmonary process seen.  Original Report Authenticated By: Santa Lighter, M.D.   Ct Head Wo Contrast  11/17/2011  *RADIOLOGY REPORT*  Clinical Data: Left-sided weakness and tingling.  History of ruptured abdominal aortic aneurysm.  CT HEAD WITHOUT CONTRAST  Technique:  Contiguous axial images were obtained from the base of the skull through the vertex without contrast.  Comparison: None.  Findings: No acute intracranial abnormality is present. Specifically, there  is no evidence for acute infarct, hemorrhage, mass, hydrocephalus, or extra-axial fluid collection.  The paranasal sinuses and mastoid air cells are clear.  The globes and orbits are intact.  The osseous skull is intact.  IMPRESSION: Negative CT of the head.  Original Report Authenticated By: Resa Miner. MATTERN, M.D.   Mr Brain Wo Contrast  11/17/2011  *RADIOLOGY REPORT*  Clinical Data:  Left-sided weakness and tingling.  MRI HEAD WITHOUT CONTRAST MRA HEAD WITHOUT CONTRAST   Technique: Multiplanar, multiecho pulse sequences of the brain and surrounding structures were obtained according to standard protocol without intravenous contrast.  Angiographic images of the head were obtained using MRA technique without contrast.  Comparison: 11/17/2011 CT.  No comparison MR.  MRI HEAD  Findings:  Small acute non hemorrhagic infarcts involving portions of the right frontal lobe and right occipital lobe.  No intracranial hemorrhage.  Moderate small vessel disease type changes.  Mild global atrophy without hydrocephalus.  No intracranial mass lesion detected on this unenhanced exam.  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  Please see above.  MRA HEAD  Findings: Marked irregularity of the cavernous segment of the internal carotid artery bilaterally with areas of moderate to marked narrowing and significant irregular dilation.  The changes are suspicious for prominent atherosclerosis with the aneurysmal aspects probably related to the atherosclerotic disease rather than saccular or blister aneurysm.  Beyond the cavernous segment, no medium or large size vessel significant stenosis or occlusion of the anterior circulation is noted.  Right vertebral artery is dominant.  Narrowed irregular small caliber left vertebral artery.  Poor delineation of the left PICA and right AICA.  Artifact proximal basilar artery suspected.  Posterior cerebral artery and superior cerebral artery mild branch vessel irregularity.  IMPRESSION: Marked irregularity of the cavernous segment of the internal carotid artery bilaterally with areas of moderate to marked narrowing and significant irregular dilation.  The changes are suspicious for prominent atherosclerosis with the aneurysmal aspects probably related to the atherosclerotic disease rather than saccular or blister aneurysm.  Narrowed irregular small caliber left vertebral artery.  Critical  Value/emergent results were called by telephone at the time of interpretation on 11/17/2011  at 9:30 p.m.  to  Dr. Ferdinand Lango, who verbally acknowledged these results.  Original Report Authenticated By: Doug Sou, M.D.   Mr Mra Head/brain Wo Cm  11/17/2011  *RADIOLOGY REPORT*  Clinical Data:  Left-sided weakness and tingling.  MRI HEAD WITHOUT CONTRAST MRA HEAD WITHOUT CONTRAST  Technique: Multiplanar, multiecho pulse sequences of the brain and surrounding structures were obtained according to standard protocol without intravenous contrast.  Angiographic images of the head were obtained using MRA technique without contrast.  Comparison: 11/17/2011 CT.  No comparison MR.  MRI HEAD  Findings:  Small acute non hemorrhagic infarcts involving portions of the right frontal lobe and right occipital lobe.  No intracranial hemorrhage.  Moderate small vessel disease type changes.  Mild global atrophy without hydrocephalus.  No intracranial mass lesion detected on this unenhanced exam.  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  Please see above.  MRA HEAD  Findings: Marked irregularity of the cavernous segment of the internal carotid artery bilaterally with areas of moderate to marked narrowing and significant irregular dilation.  The changes are suspicious for prominent atherosclerosis with the aneurysmal aspects probably related to the atherosclerotic disease rather than saccular or blister aneurysm.  Beyond the cavernous segment, no medium or large size vessel significant stenosis or occlusion of the anterior circulation is noted.  Right vertebral artery is dominant.  Narrowed irregular small caliber left vertebral artery.  Poor delineation of the left PICA and right AICA.  Artifact proximal basilar artery suspected.  Posterior cerebral artery and superior cerebral artery mild branch vessel irregularity.  IMPRESSION: Marked irregularity of  the cavernous segment of the internal carotid artery bilaterally with areas of moderate to marked narrowing and significant irregular dilation.  The changes are suspicious for prominent atherosclerosis with the aneurysmal aspects probably related to the atherosclerotic disease rather than saccular or blister aneurysm.  Narrowed irregular small caliber left vertebral artery.  Critical Value/emergent results were called by telephone at the time of interpretation on 11/17/2011  at 9:30 p.m.  to  Dr. Ferdinand Lango, who verbally acknowledged these results.  Original Report Authenticated By: Doug Sou, M.D.    Medications:  Scheduled:   . amLODipine  5 mg Oral Daily   And  . irbesartan  300 mg Oral Daily  . aspirin  324 mg Oral Once  . bisoprolol-hydrochlorothiazide  1 tablet Oral Daily  . clopidogrel  75 mg Oral Q breakfast  . enoxaparin  40 mg Subcutaneous Q24H  . ezetimibe-simvastatin  1 tablet Oral q1800  . insulin aspart  0-9 Units Subcutaneous Q4H  . niacin  500 mg Oral Daily  . omega-3 acid ethyl esters  2 g Oral Q breakfast  . Tamsulosin HCl  0.4 mg Oral QPC supper  . DISCONTD: amLODipine-olmesartan  1 tablet Oral Daily  . DISCONTD: aspirin  300 mg Rectal Daily  . DISCONTD: aspirin  325 mg Oral Daily   Continuous:   . sodium chloride 50 mL/hr at 11/17/11 2357   [id number], HYDROmorphone (DILAUDID) injection, iohexol, ondansetron (ZOFRAN) IV, oxyCODONE, senna-docusate, tiZANidine  Assessment/Plan:  Principal Problem:  *CVA (cerebral infarction) Active Problems:  Fasting hyperglycemia  HYPERLIPIDEMIA  HYPERTENSION    Acute stroke involving the right frontal and occipital lobe. Patient's symptoms have almost resolved. He does have a known history of carotid artery disease, and has undergone left carotid endarterectomy in the past. Stroke workup is in progress. Neurology has been consulted. Aspirin has been changed to Plavix.  History of hypertension. Blood pressure is  slightly elevated. However, due to his recent stroke we will not be very aggressive with blood pressure control at this time. Continue to monitor.  History of hyperlipidemia. Continue with current home medications.  DVT, prophylaxis with enoxarprin  He is a full code.   Disposition He should be able to go home once stroke workup is completed. If he does have significant stenosis of the right carotid artery, he may require surgical intervention in the future    LOS: 1 day   [facility] Pager [phone] 11/18/2011, 11:46 AM

## 2011-11-18 NOTE — Progress Notes (Signed)
UR COMPLETED  

## 2011-11-18 NOTE — ED Provider Notes (Signed)
Medical screening examination/treatment/procedure(s) were conducted as a shared visit with non-physician practitioner(s) and myself.  I personally evaluated the patient during the encounter  Orlie Dakin, MD 11/18/11 [phone]

## 2011-11-18 NOTE — ED Provider Notes (Signed)
I have personally seen and examined the patient.  I have discussed the plan of care with the resident.  I have reviewed the documentation on PMH/FH/Soc. History.  I have reviewed the documentation of the resident and agree.  Orlie Dakin, MD 11/18/11 [phone]

## 2011-11-18 NOTE — Progress Notes (Signed)
Occupational Therapy Evaluation Patient Details Name: Kevin [name] MRN: [id number] DOB: 1941/05/16 Today's Date: 11/18/2011 Time: [id number] OT Time Calculation (min): 15 min  OT Assessment / Plan / Recommendation Clinical Impression  Pt admitted with left sided numbness.  MRI results show small acute non hemorrhagic infarcts involving portions of the right frontal lobe and right occipital lobe. At this time, pt is able to perform ADLs and fucntional mobility at independent level and reports his symptoms have resolved.  Educated pt on stroke signs and symptoms.  No further acute OT needs.     OT Assessment  Patient does not need any further OT services    Follow Up Recommendations  No OT follow up    Barriers to Discharge      Equipment Recommendations  None recommended by OT    Recommendations for Other Services    Frequency       Precautions / Restrictions     Pertinent Vitals/Pain See vitals    ADL  Upper Body Dressing: Performed;Independent Where Assessed - Upper Body Dressing: Unsupported sitting Lower Body Dressing: Performed;Independent Where Assessed - Lower Body Dressing: Unsupported sit to stand Toilet Transfer: Performed;Independent Toilet Transfer Method:  (ambulating) Science writer: Regular height toilet Tub/Shower Transfer: Simulated;Independent Tub/Shower Transfer Method: Ambulating Equipment Used: Gait belt Transfers/Ambulation Related to ADLs: independent ADL Comments: Pt at baseline with ADLs.    OT Diagnosis:    OT Problem List:   OT Treatment Interventions:     OT Goals    Visit Information  Last OT Received On: 11/18/11 PT/OT Co-Evaluation/Treatment: Yes    Subjective Data      Prior Functioning  Home Living Lives With: Spouse Available Help at Discharge: Family;Available 24 hours/day Type of Home: House Home Access: Stairs to enter [employer] of Steps: 1 Entrance Stairs-Rails: None Home Layout: One  level Bathroom Shower/Tub: Walk-in shower;Door Bathroom Toilet: Handicapped height Bathroom Accessibility: Yes How Accessible: Accessible via walker Home Adaptive Equipment: Grab bars around toilet;Grab bars in shower;Shower chair with back Prior Function Level of Independence: Independent Able to Take Stairs?: Yes Driving: Yes 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 Communication Communication: No difficulties Dominant Hand: Right    Cognition  Overall Cognitive Status: Appears within functional limits for tasks assessed/performed Arousal/Alertness: Awake/alert Orientation Level: Appears intact for tasks assessed Behavior During Session: [facility] for tasks performed    Extremity/Trunk Assessment Right Upper Extremity Assessment RUE ROM/Strength/Tone: Within functional levels Left Upper Extremity Assessment LUE ROM/Strength/Tone: Within functional levels   Mobility Bed Mobility Bed Mobility: Supine to Sit;Sitting - Scoot to Edge of Bed Supine to Sit: 7: Independent Sitting - Scoot to Edge of Bed: 7: Independent Transfers Transfers: Sit to Stand;Stand to Sit Sit to Stand: 7: Independent;From bed;Without upper extremity assist Stand to Sit: 7: Independent;To chair/3-in-1;Without upper extremity assist   Exercise    Balance    End of Session OT - End of Session Equipment Utilized During Treatment: Gait belt Activity Tolerance: Patient tolerated treatment well Patient left: in chair;with call bell/phone within reach;with family/visitor present Nurse Communication: Mobility status  11/18/2011 Darrol Jump OTR/L Pager [phone] Office [phone]  Darrol Jump 11/18/2011, 3:09 PM

## 2011-11-18 NOTE — ED Provider Notes (Signed)
I have personally seen and examined the patient.  I have discussed the plan of care with the resident.  I have reviewed the documentation on PMH/FH/Soc. History.  I have reviewed the documentation of the resident and agree.  Orlie Dakin, MD 11/18/11 [phone]

## 2011-11-19 DIAGNOSIS — I10 Essential (primary) hypertension: Secondary | ICD-10-CM

## 2011-11-19 DIAGNOSIS — I6340 Cerebral infarction due to embolism of unspecified cerebral artery: Secondary | ICD-10-CM

## 2011-11-19 DIAGNOSIS — G459 Transient cerebral ischemic attack, unspecified: Secondary | ICD-10-CM

## 2011-11-19 DIAGNOSIS — G8190 Hemiplegia, unspecified affecting unspecified side: Secondary | ICD-10-CM

## 2011-11-19 DIAGNOSIS — E782 Mixed hyperlipidemia: Secondary | ICD-10-CM

## 2011-11-19 LAB — GLUCOSE, CAPILLARY: Glucose-Capillary: 95 mg/dL (ref 70–99)

## 2011-11-19 MED ORDER — CLOPIDOGREL BISULFATE 75 MG PO TABS: 75.0000 mg | ORAL_TABLET | Freq: Every day | ORAL | Status: DC

## 2011-11-19 NOTE — Discharge Instructions (Signed)
STROKE/TIA DISCHARGE INSTRUCTIONS SMOKING Cigarette smoking nearly doubles your risk of having a stroke & is the single most alterable risk factor  If you smoke or have smoked in the last 12 months, you are advised to quit smoking for your health.  Most of the excess cardiovascular risk related to smoking disappears within a year of stopping.  Ask you doctor about anti-smoking medications  [place] Quit Line: 1-800-QUIT NOW  Free Smoking Cessation Classes [phone]  CHOLESTEROL Know your levels; limit fat & cholesterol in your diet  Lipid Panel     Component Value Date/Time   CHOL 177 11/17/2011 1959   TRIG 295* 11/17/2011 1959   HDL 33* 11/17/2011 1959   CHOLHDL 5.4 11/17/2011 1959   VLDL 59* 11/17/2011 1959   [place] 85 11/17/2011 1959      Many patients benefit from treatment even if their cholesterol is at goal.  Goal: Total Cholesterol (CHOL) less than 160  Goal:  Triglycerides (TRIG) less than 150  Goal:  HDL greater than 40  Goal:  LDL (LDLCALC) less than 100   BLOOD PRESSURE American Stroke Association blood pressure target is less that 120/80 mm/Hg  Your discharge blood pressure is:  BP: 159/70 mmHg  Monitor your blood pressure  Limit your salt and alcohol intake  Many individuals will require more than one medication for high blood pressure  DIABETES (A1c is a blood sugar average for last 3 months) Goal HGBA1c is under 7% (HBGA1c is blood sugar average for last 3 months)  Diabetes: {STROKE DC DIABETES:22357}    Lab Results  Component Value Date   HGBA1C 6.2* 11/17/2011     Your HGBA1c can be lowered with medications, healthy diet, and exercise.  Check your blood sugar as directed by your physician  Call your physician if you experience unexplained or low blood sugars.  PHYSICAL ACTIVITY/REHABILITATION Goal is 30 minutes at least 4 days per week    {STROKE DC ACTIVITY/REHAB:22359}  Activity decreases your risk of heart attack and stroke and makes your heart  stronger.  It helps control your weight and blood pressure; helps you relax and can improve your mood.  Participate in a regular exercise program.  Talk with your doctor about the best form of exercise for you (dancing, walking, swimming, cycling).  DIET/WEIGHT Goal is to maintain a healthy weight  Your discharge diet is: Carb Control *** liquids Your height is:  Height: 5' 9.5" (176.5 cm) Your current weight is: Weight: 50.803 kg (112 lb) Your Body Mass Index (BMI) is:  BMI (Calculated): 16.3   Following the type of diet specifically designed for you will help prevent another stroke.  Your goal weight range is:  ***  Your goal Body Mass Index (BMI) is 19-24.  Healthy food habits can help reduce 3 risk factors for stroke:  High cholesterol, hypertension, and excess weight.  RESOURCES Stroke/Support Group:  Call [phone]  they meet the 3rd Sunday of the month on the Rehab Unit at [facility], [place] ( no meetings June, July & Aug).  STROKE EDUCATION PROVIDED/REVIEWED AND GIVEN TO PATIENT Stroke warning signs and symptoms How to activate emergency medical system (call 911). Medications prescribed at discharge. Need for follow-up after discharge. Personal risk factors for stroke. Pneumonia vaccine given:   {STROKE DC YES/NO/DATE:22363} Flu vaccine given:   {STROKE DC YES/NO/DATE:22363} My questions have been answered, the writing is legible, and I understand these instructions.  I will adhere to these goals & educational materials that have been  provided to me after my discharge from the hospital.

## 2011-11-19 NOTE — Discharge Summary (Signed)
Physician Discharge Summary  Patient ID: FILADELFIO MCEUEN MRN: [id number] DOB/AGE: [birth year] 73 y.o.  Admit date: 11/17/2011 Discharge date: 11/19/2011  PCP: Georgetta Haber, MD  DISCHARGE DIAGNOSES:  Principal Problem:  *CVA (cerebral infarction) Active Problems:  Fasting hyperglycemia  HYPERLIPIDEMIA  HYPERTENSION   DISCHARGE CONDITION: fair  INITIAL HISTORY: Kevin [name] is an 71 y.o. male who began to have left facial weakness and Left hand numbness and left lateral thigh numbness that was noticed upon awakening at 6 AM on day of admission. He reported also having a left sided headache but denied any chest pain, or SOB or nausea or vomiting or fevers or chills. He denied having any difficulty with speech or swallowing. He denied having seizures or syncope associated with the event. He reported taking a nap but awakened and the symptoms had not resolved. So his wife insisted that he call the doctor so he called his doctor and was advised to go to the hospital right away. 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   HOSPITAL COURSE:   Acute stroke involving the right frontal and occipital lobe.  Patient's symptoms have resolved. He is back at baseline level of functioning. He does have a known history of carotid artery disease, and has undergone left carotid endarterectomy in the past. Stroke workup is unremarkable so far. Neurology has cleared him for discharge. External carotid artery stenosis was noted on right. 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 Aspirin has been changed to Plavix.   History of hypertension.  Blood pressure is slightly elevated. However, due to his recent stroke we will not be very aggressive with blood pressure control at this time. He may  however resume his home medications.  History of hyperlipidemia.  Continue with current home medications.   Patient is back at baseline. Stroke work up as above. ECHO report was reviewed as well. Patient is keen on going home. [place] for discharge.   PERTINENT LABS: TG was 295. LDL 85. HDL 33. A1C 6.2.  2D ECHOCARDIOGRAM Study Conclusions  - Left ventricle: The cavity size was normal. Wall thickness was normal. Systolic function was normal. The estimated ejection fraction was in the range of 55% to 60%. - Mitral valve: Calcified annulus. - Left atrium: The atrium was mildly dilated. - Atrial septum: No defect or patent foramen ovale was identified. - No cardiac source of emboli was indentified.    IMAGING STUDIES Dg Chest 2 View  11/17/2011  *RADIOLOGY REPORT*  Clinical Data: Numbness and tingling on the left arm, leg and face. Chest aching.  CHEST - 2 VIEW  Comparison: Chest radiograph performed 04/09/2009  Findings: The lungs are well-aerated and clear.  There is no evidence of focal opacification, pleural effusion or pneumothorax.  The heart is normal in size; the mediastinal contour is within normal limits.  No acute osseous abnormalities are seen.  IMPRESSION: No acute cardiopulmonary process seen.  Original Report Authenticated By: Santa Lighter, M.D.   Ct Head Wo Contrast  11/17/2011  *RADIOLOGY REPORT*  Clinical Data: Left-sided weakness and tingling.  History of ruptured abdominal aortic aneurysm.  CT HEAD WITHOUT CONTRAST  Technique:  Contiguous axial images were obtained from the base of the skull through the vertex without contrast.  Comparison: None.  Findings: No  acute intracranial abnormality is present. Specifically, there is no evidence for acute infarct, hemorrhage, mass, hydrocephalus, or extra-axial fluid collection.  The paranasal sinuses and mastoid air cells are clear.  The globes and orbits are intact.  The osseous skull is intact.  IMPRESSION: Negative CT of the head.   Original Report Authenticated By: Resa Miner. MATTERN, M.D.   Ct Angio Neck W/cm &/or Wo/cm  11/18/2011  *RADIOLOGY REPORT*  Clinical Data:  Right brain stroke.  Carotid stenosis. Hypertension and diabetes.  CT ANGIOGRAPHY NECK  Technique:  Multidetector CT imaging of the neck was performed using the standard protocol during bolus administration of intravenous contrast.  Multiplanar CT image reconstructions including MIPs were obtained to evaluate the vascular anatomy. Carotid stenosis measurements (when applicable) are obtained utilizing NASCET criteria, using the distal internal carotid diameter as the denominator.  Contrast: 68mL OMNIPAQUE IOHEXOL 350 MG/ML SOLN  Comparison:  MRI 11/17/2011  Findings:  Lung apices are clear.  No mass or adenopathy in the neck.  Surgical clips around the left carotid bifurcation, question prior carotid endarterectomy on the left.  Standard branching pattern of the great vessels from the aortic arch.  No proximal stenosis is identified.  Right carotid:  Mild atherosclerotic thickening of the distal right common carotid artery.  Calcified and noncalcified plaque at the carotid bifurcation.  Right internal carotid artery is narrowed by approximately 30% diameter stenosis.  Critical stenosis of the right external carotid artery at the origin.  No dissection.  Left carotid:  Left common carotid artery is widely patent with mild atherosclerotic thickening distally.  Noncalcified soft tissue thickening around the left internal carotid artery without significant stenosis.  Question prior carotid endarterectomy on the left.  Vertebral arteries:  Proximal left vertebral artery is occluded and reconstitutes at the C4 level.  It is diffusely diseased but is patent to the basilar.  Right vertebral artery is patent throughout its course.  There is calcified plaque of the vertebral artery origin with mild stenosis.   Review of the MIP images confirms the above findings.  IMPRESSION: 30%  diameter stenosis proximal right internal carotid artery.  Critical stenosis at the origin of the right external carotid artery.  Probable left carotid endarterectomy with mild thickening of the vessel wall but no significant stenosis.  Occlusion of the proximal half of the left vertebral artery.  The left vertebral artery reconstitutes at C4 and  is diffusely diseased.  Right vertebral artery is patent to the basilar with a mild calcified plaque at the origin.  Original Report Authenticated By: Truett Perna, M.D.   Mr Brain MRA Wo Contrast  11/17/2011  *RADIOLOGY REPORT*  Clinical Data:  Left-sided weakness and tingling.  MRI HEAD WITHOUT CONTRAST MRA HEAD WITHOUT CONTRAST  Technique: Multiplanar, multiecho pulse sequences of the brain and surrounding structures were obtained according to standard protocol without intravenous contrast.  Angiographic images of the head were obtained using MRA technique without contrast.  Comparison: 11/17/2011 CT.  No comparison MR.  MRI HEAD  Findings:  Small acute non hemorrhagic infarcts involving portions of the right frontal lobe and right occipital lobe.  No intracranial hemorrhage.  Moderate small vessel disease type changes.  Mild global atrophy without hydrocephalus.  No intracranial mass lesion detected on this unenhanced exam.  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  Please see above.  MRA HEAD  Findings: Marked irregularity of the cavernous segment of the internal carotid artery  bilaterally with areas of moderate to marked narrowing and significant irregular dilation.  The changes are suspicious for prominent atherosclerosis with the aneurysmal aspects probably related to the atherosclerotic disease rather than saccular or blister aneurysm.  Beyond the cavernous segment, no medium or large size vessel significant stenosis or occlusion of the anterior circulation is noted.  Right  vertebral artery is dominant.  Narrowed irregular small caliber left vertebral artery.  Poor delineation of the left PICA and right AICA.  Artifact proximal basilar artery suspected.  Posterior cerebral artery and superior cerebral artery mild branch vessel irregularity.  IMPRESSION: Marked irregularity of the cavernous segment of the internal carotid artery bilaterally with areas of moderate to marked narrowing and significant irregular dilation.  The changes are suspicious for prominent atherosclerosis with the aneurysmal aspects probably related to the atherosclerotic disease rather than saccular or blister aneurysm.  Narrowed irregular small caliber left vertebral artery.  Critical Value/emergent results were called by telephone at the time of interpretation on 11/17/2011  at 9:30 p.m.  to  Dr. Ferdinand Lango, who verbally acknowledged these results.  Original Report Authenticated By: Doug Sou, M.D.    DISCHARGE EXAMINATION: Blood pressure 144/70, pulse 65, temperature 97.9 F (36.6 C), temperature source Oral, resp. rate 18, height 5' 9.5" (1.765 m), weight 50.803 kg (112 lb), SpO2 95.00%. General appearance: alert, cooperative, appears stated age and no distress Resp: clear to auscultation bilaterally Cardio: regular rate and rhythm, S1, S2 normal, no murmur, click, rub or gallop GI: soft, non-tender; bowel sounds normal; no masses,  no organomegaly Extremities: extremities normal, atraumatic, no cyanosis or edema Skin: Skin color, texture, turgor normal. No rashes or lesions Neurologic: Grossly normal  DISPOSITION: Home  Discharge Orders    Future Appointments: Provider: Department: Dept Phone: Center:   12/12/2011 1:45 PM Ricard Dillon, MD [facility] [phone] [facility]   01/12/2012 2:30 PM [facility] [id number] [facility]   01/12/2012 3:45 PM Elam Dutch, MD [facility] [phone] [facility]     Future Orders Please Complete By Expires   Diet - low sodium heart  healthy      Increase activity slowly        Current Discharge Medication List    START taking these medications   Details  clopidogrel (PLAVIX) 75 MG tablet Take 1 tablet (75 mg total) by mouth daily with breakfast. Qty: 30 tablet, Refills: 3      CONTINUE these medications which have NOT CHANGED   Details  amLODipine-olmesartan (AZOR) 5-40 MG per tablet Take 1 tablet by mouth daily.    Ascorbic Acid (VITAMIN C) 500 MG CHEW Chew by mouth daily.      bisoprolol-hydrochlorothiazide (ZIAC) 10-6.25 MG per tablet Take 1 tablet by mouth daily.    diclofenac (VOLTAREN) 75 MG EC tablet Take 75 mg by mouth 2 (two) times daily as needed. For pain    ezetimibe-simvastatin (VYTORIN) 10-40 MG per tablet Take 1 tablet by mouth daily.    Melatonin 3 MG TABS Take 1 tablet by mouth at bedtime.      niacin (NIASPAN) 500 MG CR tablet Take 500 mg by mouth daily.      OMEGA 3 1000 MG CAPS Take 2 capsules by mouth daily.      Saw Palmetto, Serenoa repens, 1000 MG CAPS Take by mouth daily.      Tamsulosin HCl (FLOMAX) 0.4 MG CAPS Take by mouth daily after supper.      tiZANidine (ZANAFLEX) 4 MG capsule Take 4 mg by  mouth as needed. For muscle spasm       STOP taking these medications     aspirin 81 MG chewable tablet        Follow-up Information    Follow up with Georgetta Haber, MD. Schedule an appointment as soon as possible for a visit in 1 week. (post hospitalization follow up)    Contact information:   [place] [phone]       Follow up with Forbes Cellar, MD. Schedule an appointment as soon as possible for a visit in 2 months. (stroke follow up)    Contact information:   [address] [facility] [place]          TOTAL DISCHARGE TIME: 56 mins  [facility] Pager [phone]  11/19/2011, 10:59 AM

## 2011-11-19 NOTE — Progress Notes (Signed)
Discharged to home, ambulated down to car

## 2011-11-19 NOTE — Progress Notes (Signed)
Admission History: Kevin [name] is an 71 y.o. male with history of left CEA, AAA s/p repair, HTN, hyperlipidemia, and DM. Patient woke up in the morning at 6:30 and noted left hand, arm and leg decreased sensation with component of left eye blurriness. This was persistent through the morning. Patient made a call to his vascular surgeon who recommended he be seen in ED. Initial CT was negative however MRI showed small acute non hemorrhagic infarcts involving portions of the right frontal lobe and right occipital lobe. Neurology was consulted to further evaluate.  Subjective: I feel great!  Strength and sensation on left side have returned to normal.  Balance ok.  Works maintenance which requires Investment banker, operational, working on Health and safety inspector, etc...  Objective: BP 144/70  Pulse 65  Temp 97.9 F (36.6 C) (Oral)  Resp 18  Ht 5' 9.5" (1.765 m)  Wt 50.803 kg (112 lb)  BMI 16.30 kg/m2  SpO2 95%  CBGs  Basename 11/19/11 0413 11/19/11 0004 11/18/11 2002 11/18/11 1623 11/18/11 1223 11/18/11 0812 11/18/11 0406 11/18/11 0205 11/17/11 1616  GLUCAP 106* 95 110* 96 121* 129* 102* 102* 199*   Diet: CHO mod, thin Activity: up ad lib DVT Prophylaxis: lovenox  Medications: Scheduled:   . amLODipine  5 mg Oral Daily   And  . irbesartan  300 mg Oral Daily  . bisoprolol-hydrochlorothiazide  1 tablet Oral Daily  . clopidogrel  75 mg Oral Q breakfast  . enoxaparin  40 mg Subcutaneous Q24H  . ezetimibe-simvastatin  1 tablet Oral q1800  . insulin aspart  0-9 Units Subcutaneous Q4H  . niacin  500 mg Oral Daily  . omega-3 acid ethyl esters  2 g Oral Q breakfast  . Tamsulosin HCl  0.4 mg Oral QPC supper    Neurologic Exam: Mental Status: Alert, oriented, thought content appropriate.  Speech fluent without evidence of aphasia. Able to follow 3 step commands without difficulty. Cranial Nerves: II- Visual fields grossly intact. III/IV/VI-Extraocular movements intact.  Pupils reactive  bilaterally. V/VII-Smile symmetric VIII-hearing grossly intact IX/X-normal gag XI-bilateral shoulder shrug XII-midline tongue extension Motor: 5/5 bilaterally with normal tone and bulk Sensory: Light touch intact throughout, bilaterally Deep Tendon Reflexes: 2+ and symmetric throughout Plantars: Downgoing bilaterally Cerebellar: Normal finger-to-nose, normal rapid alternating movements and normal heel-to-shin test.   Lab Results: Basic Metabolic Panel:  Lab [id number] 1955 11/17/11 1714  NA 140 140  K 4.8 4.2  CL 106 105  CO2 23 24  GLUCOSE 76 132*  BUN 21 22  CREATININE 1.18 1.22  CALCIUM 8.8 9.1  MG -- --  PHOS -- --   Liver Function Tests:  Lab 11/17/11 1955 11/17/11 1714  AST 24 24  ALT 32 33  ALKPHOS 55 58  BILITOT 0.3 0.3  PROT 6.7 7.1  ALBUMIN 3.8 3.9   CBC:  Lab 11/17/11 1955 11/17/11 1714  WBC 8.6 8.8  NEUTROABS -- 5.7  HGB 11.9* 12.3*  HCT 34.4* 35.0*  MCV 86.6 87.3  PLT 171 161   CBG:  Lab 11/19/11 0413 11/19/11 0004 11/18/11 2002 11/18/11 1623 11/18/11 1223 11/18/11 0812  GLUCAP 106* 95 110* 96 121* 129*   Hemoglobin A1C:  Lab 11/17/11 1955  HGBA1C 6.2*   Fasting Lipid Panel:  Lab 11/17/11 1959  CHOL 177  HDL 33*  LDLCALC 85  TRIG 295*  CHOLHDL 5.4  LDLDIRECT --   Coagulation:  Lab 11/17/11 1955  LABPROT 13.9  INR 1.05   Study Results:  11/17/2011 CHEST - 2 VIEW  Findings: The lungs are well-aerated and clear.  There is no evidence of focal opacification, pleural effusion or pneumothorax.  The heart is normal in size; the mediastinal contour is within normal limits.  No acute osseous abnormalities are seen.  IMPRESSION: No acute cardiopulmonary process seen. JEFFREY CHANG, M.D.   11/17/2011   CT HEAD WITHOUT CONTRAST Findings: No acute intracranial abnormality is present. Specifically, there is no evidence for acute infarct, hemorrhage, mass, hydrocephalus, or extra-axial fluid collection.  The paranasal sinuses and mastoid air  cells are clear.  The globes and orbits are intact.  The osseous skull is intact.  IMPRESSION: Negative CT of the head.  CHRISTOPHER W. MATTERN, M.D.   11/18/2011 CT ANGIOGRAPHY NECK   Findings:  Lung apices are clear.  No mass or adenopathy in the neck.  Surgical clips around the left carotid bifurcation, question prior carotid endarterectomy on the left.  Standard branching pattern of the great vessels from the aortic arch.  No proximal stenosis is identified.  Right carotid:  Mild atherosclerotic thickening of the distal right common carotid artery.  Calcified and noncalcified plaque at the carotid bifurcation.  Right internal carotid artery is narrowed by approximately 30% diameter stenosis.  Critical stenosis of the right external carotid artery at the origin.  No dissection.  Left carotid:  Left common carotid artery is widely patent with mild atherosclerotic thickening distally.  Noncalcified soft tissue thickening around the left internal carotid artery without significant stenosis.  Question prior carotid endarterectomy on the left.  Vertebral arteries:  Proximal left vertebral artery is occluded and reconstitutes at the C4 level.  It is diffusely diseased but is patent to the basilar.  Right vertebral artery is patent throughout its course.  There is calcified plaque of the vertebral artery origin with mild stenosis.   Review of the MIP images confirms the above findings.  IMPRESSION: 30% diameter stenosis proximal right internal carotid artery.  Critical stenosis at the origin of the right external carotid artery.  Probable left carotid endarterectomy with mild thickening of the vessel wall but no significant stenosis.  Occlusion of the proximal half of the left vertebral artery.  The left vertebral artery reconstitutes at C4 and  is diffusely diseased.  Right vertebral artery is patent to the basilar with a mild calcified plaque at the origin. Truett Perna, M.D.   11/17/2011 MRI HEAD  Findings:   Small acute non hemorrhagic infarcts involving portions of the right frontal lobe and right occipital lobe.  No intracranial hemorrhage.  Moderate small vessel disease type changes.  Mild global atrophy without hydrocephalus.  No intracranial mass lesion detected on this unenhanced exam.  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  Please see above.   Doug Sou, M.D.   11/17/2011 MRA HEAD  Findings: Marked irregularity of the cavernous segment of the internal carotid artery bilaterally with areas of moderate to marked narrowing and significant irregular dilation.  The changes are suspicious for prominent atherosclerosis with the aneurysmal aspects probably related to the atherosclerotic disease rather than saccular or blister aneurysm.  Beyond the cavernous segment, no medium or large size vessel significant stenosis or occlusion of the anterior circulation is noted.  Right vertebral artery is dominant.  Narrowed irregular small caliber left vertebral artery.  Poor delineation of the left PICA and right AICA.  Artifact proximal basilar artery suspected.  Posterior cerebral artery and superior cerebral artery mild branch vessel irregularity.  IMPRESSION: Marked irregularity of the cavernous segment of the internal carotid artery bilaterally with areas of moderate to marked narrowing and significant irregular dilation.  The changes are suspicious for prominent atherosclerosis with the aneurysmal aspects probably related to the atherosclerotic disease rather than saccular or blister aneurysm.  Narrowed irregular small caliber left vertebral artery.  Doug Sou, M.D.   11/18/11 2 D Echo:Left ventricle: The cavity size was normal. Wall thickness was normal. Systolic function was normal. The estimated ejection fraction was in the range of 55% to 60%. - Mitral valve: Calcified annulus. - Left atrium: The atrium was mildly  dilated. - Atrial septum: No defect or patent foramen ovale was identified. Impressions: No cardiac source of emboli was indentified.  Therapies: PT: no need for OP tx.  Assessment/Plan: 71 YO male with history of left CEA, AAA s/p repair, HTN, hyperlipidemia, and DM with new  small acute non hemorrhagic infarcts involving portions of the right frontal lobe and right occipital lobe.  Patient has returned to baseline, without deficits.  CTA neck did not reveal high grade stenosis, and echo was negative.  Patient needs aggressive risk factor modification( recommend LDL goal < 70 ) and is on appropriate medications; Plavix and Vytorin.  A1c 6.2.  Should f/u with primary MD in 1 week, and if stable, can return to work from neuro standpoint.  Schedule 2 month follow up with Dr. Leonie Man.  No further neurologic intervention is recommended at this time.  If further questions arise, please call or page at that time.  Thank you for allowing neurology to participate in the care of this patient.  Patient has been seen and examined by Dr. Conley Canal, who agrees with above.   LOS: 2 days   Berlin Hun PA-C [facility] [id number] 11/19/2011  10:30 AM  I have seen and examined this patient and agree with the plan and examined as detailed above.  Caffie Damme, MD [facility] Stroke Team [phone] 11/19/2011 11:11 AM

## 2011-11-22 ENCOUNTER — Encounter: Payer: Self-pay | Admitting: Family

## 2011-11-22 ENCOUNTER — Ambulatory Visit (INDEPENDENT_AMBULATORY_CARE_PROVIDER_SITE_OTHER): Payer: Managed Care, Other (non HMO) | Admitting: Family

## 2011-11-22 VITALS — BP 130/80 | Temp 98.3°F | Wt 199.0 lb

## 2011-11-22 DIAGNOSIS — E785 Hyperlipidemia, unspecified: Secondary | ICD-10-CM

## 2011-11-22 DIAGNOSIS — I6350 Cerebral infarction due to unspecified occlusion or stenosis of unspecified cerebral artery: Secondary | ICD-10-CM

## 2011-11-22 DIAGNOSIS — I639 Cerebral infarction, unspecified: Secondary | ICD-10-CM

## 2011-11-22 DIAGNOSIS — I10 Essential (primary) hypertension: Secondary | ICD-10-CM

## 2011-11-22 NOTE — Progress Notes (Signed)
Subjective:    Patient ID: Kevin [name], male    DOB: 1940/08/01, 71 y.o.   MRN: [id number]  HPI  71 year old Caucasian male, nonsmoker, presents today for follow up after hospitalization for a cerebrovascular accident. He has felt pretty well since being discharged June 22, states he tired more quickly initially, but this has since improved. Feels like he is back to his baseline before the CVA and denies any residual symptoms. Needs to be cleared to return to work - would like to return July 1.    Review of Systems  Constitutional: Negative.   HENT: Negative.   Eyes: Negative.  Negative for visual disturbance.  Cardiovascular: Negative.  Negative for chest pain and palpitations.  Gastrointestinal: Negative.   Genitourinary: Negative.   Musculoskeletal: Negative.   Neurological: Negative for dizziness, tremors, facial asymmetry, speech difficulty, light-headedness, numbness and headaches.   Past Medical History  Diagnosis Date  . Diabetes mellitus   . Hyperlipidemia   . Hypertension     History   Social History  . Marital Status: Married    Spouse Name: N/A    Number of Children: N/A  . Years of Education: N/A   Occupational History  . Not on file.   Social History Main Topics  . Smoking status: Former Smoker    Quit date: 05/30/1990  . Smokeless tobacco: Not on file  . Alcohol Use: No  . Drug Use: No  . Sexually Active: Yes   Other Topics Concern  . Not on file   Social History Narrative  . No narrative on file    Past Surgical History  Procedure Date  . Carotid endarterectomy   . Testicular cyst   . Abdominal aortic aneurysm repair   . Abdominal aortic aneurysm repair     Family History  Problem Relation Age of Onset  . Parkinsonism Father   . Dementia Father   . Colon cancer Mother     No Known Allergies  Current Outpatient Prescriptions on File Prior to Visit  Medication Sig Dispense Refill  . amLODipine-olmesartan (AZOR) 5-40 MG per tablet  Take 1 tablet by mouth daily.      . Ascorbic Acid (VITAMIN C) 500 MG CHEW Chew by mouth daily.        . bisoprolol-hydrochlorothiazide (ZIAC) 10-6.25 MG per tablet Take 1 tablet by mouth daily.      . clopidogrel (PLAVIX) 75 MG tablet Take 1 tablet (75 mg total) by mouth daily with breakfast.  30 tablet  3  . diclofenac (VOLTAREN) 75 MG EC tablet Take 75 mg by mouth 2 (two) times daily as needed. For pain      . ezetimibe-simvastatin (VYTORIN) 10-40 MG per tablet Take 1 tablet by mouth daily.      . Melatonin 3 MG TABS Take 1 tablet by mouth at bedtime.        . niacin (NIASPAN) 500 MG CR tablet Take 500 mg by mouth daily.        . OMEGA 3 1000 MG CAPS Take 2 capsules by mouth daily.        . Saw Palmetto, Serenoa repens, 1000 MG CAPS Take by mouth daily.        . Tamsulosin HCl (FLOMAX) 0.4 MG CAPS Take by mouth daily after supper.        Marland Kitchen tiZANidine (ZANAFLEX) 4 MG capsule Take 4 mg by mouth as needed. For muscle spasm         BP 130/80  Temp 98.3 F (36.8 C) (Oral)  Wt 199 lb (90.266 kg)chart     Objective:   Physical Exam  Constitutional: He is oriented to person, place, and time. He appears well-developed and well-nourished. No distress.  HENT:  Head: Normocephalic.  Right Ear: External ear normal.  Left Ear: External ear normal.  Eyes: Pupils are equal, round, and reactive to light.  Neck: Normal range of motion.  Cardiovascular: Normal rate, regular rhythm, normal heart sounds and intact distal pulses.  Exam reveals no gallop and no friction rub.   No murmur heard. Pulmonary/Chest: Effort normal and breath sounds normal. No respiratory distress. He has no wheezes. He has no rales.  Musculoskeletal: Normal range of motion.  Neurological: He is alert and oriented to person, place, and time. He has normal reflexes. No cranial nerve deficit. Coordination normal.  Skin: Skin is warm and dry.  Psychiatric: He has a normal mood and affect. His behavior is normal. Thought content  normal.          Assessment & Plan:  Assessment: CVA, hypertension, hyperlipidemia  Plan: Neurological exam was unremarkable; he is okay to return to work at this time. Provided a work note. Instructed him to call 911 if he develops any numbness, weakness, or other symptoms concerning of CVA. Will follow up as scheduled or before as needed.

## 2011-11-22 NOTE — Patient Instructions (Addendum)
Stroke Prevention Some medical conditions and behaviors are associated with an increased chance of having a stroke. You may prevent a stroke by making healthy choices and managing medical conditions. Reduce your risk of having a stroke by:  Staying physically active. Get at least 30 minutes of activity on most or all days.   Not smoking. It may also be helpful to avoid exposure to secondhand smoke.   Limiting alcohol use. Moderate alcohol use is considered to be:   No more than 2 drinks per day for men.   No more than 1 drink per day for nonpregnant women.   Eating healthy foods.   Include 5 or more servings of fruits and vegetables a day.   Certain diets may be prescribed to address high blood pressure, high cholesterol, diabetes, or obesity.   Managing your cholesterol levels.   A low-saturated fat, low-trans fat, low-cholesterol, and high-fiber diet may control cholesterol levels.   Take any prescribed medicines to control cholesterol as directed by your caregiver.   Managing your diabetes.   A controlled-carbohydrate, controlled-sugar diet is recommended to manage diabetes.   Take any prescribed medicines to control diabetes as directed by your caregiver.   Controlling your high blood pressure (hypertension).   A low-salt (sodium), low-saturated fat, low-trans fat, and low-cholesterol diet is recommended to manage high blood pressure.   Take any prescribed medicines to control hypertension as directed by your caregiver.   Maintaining a healthy weight.   A reduced-calorie, low-sodium, low-saturated fat, low-trans fat, low-cholesterol diet is recommended to manage weight.   Stopping drug abuse.   Avoiding birth control pills.   Talk to your caregiver about the risks of taking birth control pills if you are over 81 years old, smoke, get migraines, or have ever had a blood clot.   Getting evaluated for sleep disorders (sleep apnea).   Talk to your caregiver about  getting a sleep evaluation if you snore a lot or have excessive sleepiness.   Taking medicines as directed by your caregiver.   For some people, aspirin or blood thinners (anticoagulants) are helpful in reducing the risk of forming abnormal blood clots that can lead to stroke. If you have the irregular heart rhythm of atrial fibrillation, you should be on a blood thinner unless there is a good reason you cannot take them.   Understand all your medicine instructions.  SEEK IMMEDIATE MEDICAL CARE IF:   You have sudden weakness or numbness of the face, arm, or leg, especially on one side of the body.   You have sudden confusion.   You have trouble speaking (aphasia) or understanding.   You have sudden trouble seeing in one or both eyes.   You have sudden trouble walking.   You have dizziness.   You have a loss of balance or coordination.   You have a sudden, severe headache with no known cause.   You have new chest pain or an irregular heartbeat.  Any of these symptoms may represent a serious problem that is an emergency. Do not wait to see if the symptoms will go away. Get medical help right away. Call your local emergency services (911 in [place] Do not drive yourself to the hospital. Document Released: 06/23/2004 Document Revised: 05/05/2011 Document Reviewed: 01/03/2011 [facility] Patient Information 2012 [place], [place].

## 2011-11-29 ENCOUNTER — Encounter: Payer: Self-pay | Admitting: Internal Medicine

## 2011-12-02 ENCOUNTER — Telehealth: Payer: Self-pay | Admitting: Internal Medicine

## 2011-12-02 NOTE — Telephone Encounter (Signed)
Pt is requesting more samples of tribenzor. Pt is aware MD out of office until monday

## 2011-12-05 NOTE — Telephone Encounter (Signed)
Talked with pt and c/o diastolic too low and feeling tire- per dr Arnoldo Morale- stop tribenzor an d start back on azor and ziac and monitor bp and bring readings at ov next [facility] understood

## 2011-12-12 ENCOUNTER — Ambulatory Visit (INDEPENDENT_AMBULATORY_CARE_PROVIDER_SITE_OTHER): Payer: Managed Care, Other (non HMO) | Admitting: Internal Medicine

## 2011-12-12 ENCOUNTER — Encounter: Payer: Self-pay | Admitting: Internal Medicine

## 2011-12-12 VITALS — BP 136/72 | HR 60 | Temp 98.2°F | Resp 16 | Ht 69.5 in | Wt 200.0 lb

## 2011-12-12 DIAGNOSIS — I739 Peripheral vascular disease, unspecified: Secondary | ICD-10-CM

## 2011-12-12 DIAGNOSIS — H9190 Unspecified hearing loss, unspecified ear: Secondary | ICD-10-CM

## 2011-12-12 DIAGNOSIS — I2510 Atherosclerotic heart disease of native coronary artery without angina pectoris: Secondary | ICD-10-CM

## 2011-12-12 DIAGNOSIS — E785 Hyperlipidemia, unspecified: Secondary | ICD-10-CM

## 2011-12-12 DIAGNOSIS — M5416 Radiculopathy, lumbar region: Secondary | ICD-10-CM

## 2011-12-12 DIAGNOSIS — IMO0002 Reserved for concepts with insufficient information to code with codable children: Secondary | ICD-10-CM

## 2011-12-12 MED ORDER — CLOPIDOGREL BISULFATE 75 MG PO TABS: 75.0000 mg | ORAL_TABLET | Freq: Every day | ORAL | Status: DC

## 2011-12-12 NOTE — Patient Instructions (Addendum)
Modified sodium diet not salt restricted Just make wise choices not given a limit  NO SODA  The carbonation changes PH Lemonade in a can is OK Gingerale  ( not diet)    Consider  Water with added flavors  Back Exercises Back exercises help treat and prevent back injuries. The goal of back exercises is to increase the strength of your abdominal and back muscles and the flexibility of your back. These exercises should be started when you no longer have back pain. Back exercises include:  Pelvic Tilt. Lie on your back with your knees bent. Tilt your pelvis until the lower part of your back is against the floor. Hold this position 5 to 10 sec and repeat 5 to 10 times.   Knee to Chest. Pull first 1 knee up against your chest and hold for 20 to 30 seconds, repeat this with the other knee, and then both knees. This may be done with the other leg straight or bent, whichever feels better.   Sit-Ups or Curl-Ups. Bend your knees 90 degrees. Start with tilting your pelvis, and do a partial, slow sit-up, lifting your trunk only 30 to 45 degrees off the floor. Take at least 2 to 3 seconds for each sit-up. Do not do sit-ups with your knees out straight. If partial sit-ups are difficult, simply do the above but with only tightening your abdominal muscles and holding it as directed.   Hip-Lift. Lie on your back with your knees flexed 90 degrees. Push down with your feet and shoulders as you raise your hips a couple inches off the floor; hold for 10 seconds, repeat 5 to 10 times.   Back arches. Lie on your stomach, propping yourself up on bent elbows. Slowly press on your hands, causing an arch in your low back. Repeat 3 to 5 times. Any initial stiffness and discomfort should lessen with repetition over time.   Shoulder-Lifts. Lie face down with arms beside your body. Keep hips and torso pressed to floor as you slowly lift your head and shoulders off the floor.  Do not overdo your exercises, especially in the  beginning. Exercises may cause you some mild back discomfort which lasts for a few minutes; however, if the pain is more severe, or lasts for more than 15 minutes, do not continue exercises until you see your caregiver. Improvement with exercise therapy for back problems is slow.  See your caregivers for assistance with developing a proper back exercise program. Document Released: 06/23/2004 Document Revised: 05/05/2011 Document Reviewed: 05/16/2005 [facility] Patient Information 2012 [place].  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

## 2011-12-12 NOTE — Progress Notes (Signed)
Subjective:    Patient ID: Kevin [name], male    DOB: 08/24/1940, 71 y.o.   MRN: [id number]  HPI  Increased fatigue Concerned about 5 hour energy drinks Hx of CVA/ TIA Has left arm and left leg discomfort in the setting of know lumbar and cervical disc dz He has episodic tinnitus   Review of Systems  Constitutional: Negative for fever and fatigue.  HENT: Negative for hearing loss, congestion, neck pain and postnasal drip.   Eyes: Negative for discharge, redness and visual disturbance.  Respiratory: Negative for cough, shortness of breath and wheezing.   Cardiovascular: Negative for leg swelling.  Gastrointestinal: Negative for abdominal pain, constipation and abdominal distention.  Genitourinary: Negative for urgency and frequency.  Musculoskeletal: Negative for joint swelling and arthralgias.  Skin: Negative for color change and rash.  Neurological: Negative for weakness and light-headedness.  Hematological: Negative for adenopathy.  Psychiatric/Behavioral: Negative for behavioral problems.   Past Medical History  Diagnosis Date  . Diabetes mellitus   . Hyperlipidemia   . Hypertension     History   Social History  . Marital Status: Married    Spouse Name: N/A    Number of Children: N/A  . Years of Education: N/A   Occupational History  . Not on file.   Social History Main Topics  . Smoking status: Former Smoker    Quit date: 05/30/1990  . Smokeless tobacco: Not on file  . Alcohol Use: No  . Drug Use: No  . Sexually Active: Yes   Other Topics Concern  . Not on file   Social History Narrative  . No narrative on file    Past Surgical History  Procedure Date  . Carotid endarterectomy   . Testicular cyst   . Abdominal aortic aneurysm repair   . Abdominal aortic aneurysm repair     Family History  Problem Relation Age of Onset  . Parkinsonism Father   . Dementia Father   . Colon cancer Mother     No Known Allergies  Current Outpatient  Prescriptions on File Prior to Visit  Medication Sig Dispense Refill  . amLODipine-olmesartan (AZOR) 5-40 MG per tablet Take 1 tablet by mouth daily.      . Ascorbic Acid (VITAMIN C) 500 MG CHEW Chew by mouth daily.        . bisoprolol-hydrochlorothiazide (ZIAC) 10-6.25 MG per tablet Take 1 tablet by mouth daily.      . diclofenac (VOLTAREN) 75 MG EC tablet Take 75 mg by mouth 2 (two) times daily as needed. For pain      . ezetimibe-simvastatin (VYTORIN) 10-40 MG per tablet Take 1 tablet by mouth daily.      . Melatonin 3 MG TABS Take 1 tablet by mouth at bedtime.        . niacin (NIASPAN) 500 MG CR tablet Take 500 mg by mouth daily.        . OMEGA 3 1000 MG CAPS Take 2 capsules by mouth daily.        . Saw Palmetto, Serenoa repens, 1000 MG CAPS Take by mouth daily.        . Tamsulosin HCl (FLOMAX) 0.4 MG CAPS Take by mouth daily after supper.        Marland Kitchen tiZANidine (ZANAFLEX) 4 MG capsule Take 4 mg by mouth as needed. For muscle spasm       . traMADol (ULTRAM) 50 MG tablet         BP 136/72  Pulse 60  Temp 98.2 F (36.8 C)  Resp 16  Ht 5' 9.5" (1.765 m)  Wt 200 lb (90.719 kg)  BMI 29.11 kg/m2       Objective:   Physical Exam  Constitutional: He appears well-developed and well-nourished.  HENT:  Head: Normocephalic and atraumatic.  Eyes: Conjunctivae are normal. Pupils are equal, round, and reactive to light.  Neck: Normal range of motion. Neck supple.  Cardiovascular: Normal rate and regular rhythm.   Pulmonary/Chest: Effort normal and breath sounds normal.  Abdominal: Soft. Bowel sounds are normal.          Assessment & Plan:  Discussed in detail the possible effect of energy drinks on hypertension in the setting of a prior stroke.  We recommend that he avoid these. The pressure is moderately well-controlled discussed diet exercise as adjuvants to medication for control.  He does have prediabetes we discussed the role weight loss and controlling this condition.  He  meets criteria for syndrome X.  Reviewed lipid control Recommend increased stretching and exercise for osteoarthritis  A referral was made to ENT for tinnitus

## 2011-12-13 ENCOUNTER — Other Ambulatory Visit: Payer: Self-pay | Admitting: *Deleted

## 2011-12-13 DIAGNOSIS — I2510 Atherosclerotic heart disease of native coronary artery without angina pectoris: Secondary | ICD-10-CM

## 2011-12-13 MED ORDER — CLOPIDOGREL BISULFATE 75 MG PO TABS: 75.0000 mg | ORAL_TABLET | Freq: Every day | ORAL | Status: AC

## 2011-12-31 ENCOUNTER — Other Ambulatory Visit: Payer: Self-pay | Admitting: Internal Medicine

## 2012-01-05 ENCOUNTER — Other Ambulatory Visit: Payer: Managed Care, Other (non HMO)

## 2012-01-05 ENCOUNTER — Ambulatory Visit: Payer: Managed Care, Other (non HMO) | Admitting: Vascular Surgery

## 2012-01-11 ENCOUNTER — Encounter: Payer: Self-pay | Admitting: Neurosurgery

## 2012-01-11 ENCOUNTER — Encounter: Payer: Self-pay | Admitting: Internal Medicine

## 2012-01-12 ENCOUNTER — Ambulatory Visit (INDEPENDENT_AMBULATORY_CARE_PROVIDER_SITE_OTHER): Payer: Managed Care, Other (non HMO) | Admitting: Neurosurgery

## 2012-01-12 ENCOUNTER — Ambulatory Visit (INDEPENDENT_AMBULATORY_CARE_PROVIDER_SITE_OTHER): Payer: Managed Care, Other (non HMO) | Admitting: *Deleted

## 2012-01-12 ENCOUNTER — Encounter: Payer: Self-pay | Admitting: Neurosurgery

## 2012-01-12 VITALS — BP 139/55 | HR 46 | Resp 14 | Ht 69.5 in | Wt 197.5 lb

## 2012-01-12 DIAGNOSIS — I6529 Occlusion and stenosis of unspecified carotid artery: Secondary | ICD-10-CM

## 2012-01-12 DIAGNOSIS — I714 Abdominal aortic aneurysm, without rupture: Secondary | ICD-10-CM

## 2012-01-12 DIAGNOSIS — Z48812 Encounter for surgical aftercare following surgery on the circulatory system: Secondary | ICD-10-CM

## 2012-01-12 NOTE — Progress Notes (Signed)
[facility] Carotid Office Note  CC: Annual carotid surveillance Referring Physician: Fields  History of Present Illness: 71 year old male patient of Dr. Oneida Alar status post left CEA in 2006. The patient denies any signs or symptoms of CVA, TIA, amaurosis fugax or any neural deficit. The patient denies any new medical diagnoses or recent surgeries.  Past Medical History  Diagnosis Date  . Hyperlipidemia   . Hypertension   . Myocardial infarction June 2013     X 2 mini stroke    ROS: [x]  Positive   [ ]  Denies    General: [ ]  Weight loss, [ ]  Fever, [ ]  chills Neurologic: [ ]  Dizziness, [ ]  Blackouts, [ ]  Seizure [ ]  Stroke, [ ]  "Mini stroke", [ ]  Slurred speech, [ ]  Temporary blindness; [ ]  weakness in arms or legs, [ ]  Hoarseness Cardiac: [ ]  Chest pain/pressure, [ ]  Shortness of breath at rest [ ]  Shortness of breath with exertion, [ ]  Atrial fibrillation or irregular heartbeat Vascular: [ ]  Pain in legs with walking, [ ]  Pain in legs at rest, [ ]  Pain in legs at night,  [ ]  Non-healing ulcer, [ ]  Blood clot in vein/DVT,   Pulmonary: [ ]  Home oxygen, [ ]  Productive cough, [ ]  Coughing up blood, [ ]  Asthma,  [ ]  Wheezing Musculoskeletal:  [ ]  Arthritis, [ ]  Low back pain, [ ]  Joint pain Hematologic: [ ]  Easy Bruising, [ ]  Anemia; [ ]  Hepatitis Gastrointestinal: [ ]  Blood in stool, [ ]  Gastroesophageal Reflux/heartburn, [ ]  Trouble swallowing Urinary: [ ]  chronic Kidney disease, [ ]  on HD - [ ]  MWF or [ ]  TTHS, [ ]  Burning with urination, [ ]  Difficulty urinating Skin: [ ]  Rashes, [ ]  Wounds Psychological: [ ]  Anxiety, [ ]  Depression   Social History History  Substance Use Topics  . Smoking status: Former Smoker    Quit date: 05/30/1990  . Smokeless tobacco: Not on file  . Alcohol Use: No    Family History Family History  Problem Relation Age of Onset  . Parkinsonism Father   . Dementia Father   . Colon cancer Mother   . Cancer Mother      No Known Allergies  Current Outpatient Prescriptions  Medication Sig Dispense Refill  . amLODipine-olmesartan (AZOR) 5-40 MG per tablet Take 1 tablet by mouth daily.      . Ascorbic Acid (VITAMIN C) 500 MG CHEW Chew by mouth daily.        . bisoprolol-hydrochlorothiazide (ZIAC) 10-6.25 MG per tablet Take 1 tablet by mouth daily.      . clopidogrel (PLAVIX) 75 MG tablet Take 1 tablet (75 mg total) by mouth daily with breakfast.  90 tablet  3  . diclofenac (VOLTAREN) 75 MG EC tablet Take 75 mg by mouth 2 (two) times daily as needed. For pain      . ezetimibe-simvastatin (VYTORIN) 10-40 MG per tablet Take 1 tablet by mouth daily.      . Melatonin 3 MG TABS Take 1 tablet by mouth at bedtime.        . niacin (NIASPAN) 500 MG CR tablet Take 500 mg by mouth daily.        . OMEGA 3 1000 MG CAPS Take 2 capsules by mouth daily.        . Saw Palmetto, Serenoa repens, 1000 MG CAPS Take by mouth daily.        . Tamsulosin HCl (FLOMAX) 0.4 MG CAPS  Take by mouth daily after supper.        Marland Kitchen tiZANidine (ZANAFLEX) 4 MG capsule Take 4 mg by mouth as needed. For muscle spasm       . tiZANidine (ZANAFLEX) 4 MG tablet TAKE ONE TABLET BY MOUTH EVERY 6 HOURS AS NEEDED  30 tablet  1  . traMADol (ULTRAM) 50 MG tablet         Physical Examination  Filed Vitals:   01/12/12 1535  BP: 139/55  Pulse: 46  Resp:     Body mass index is 28.75 kg/(m^2).  General:  WDWN in NAD Gait: Normal HEENT: WNL Eyes: Pupils equal Pulmonary: normal non-labored breathing , without Rales, rhonchi,  wheezing Cardiac: RRR, without  Murmurs, rubs or gallops; Abdomen: soft, NT, no masses Skin: no rashes, ulcers noted  Vascular Exam Pulses: 3+ radial pulses bilaterally Carotid bruits: Carotid pulses to auscultation no bruits are heard Extremities without ischemic changes, no Gangrene , no cellulitis; no open wounds;  Musculoskeletal: no muscle wasting or atrophy   Neurologic: A&O X 3; Appropriate Affect ; SENSATION:  normal; MOTOR FUNCTION:  moving all extremities equally. Speech is fluent/normal  Non-Invasive Vascular Imaging CAROTID DUPLEX 01/12/2012  Right ICA 40 - 59 % stenosis Left ICA 20 - 39 % stenosis   ASSESSMENT/PLAN: Asymptomatic patient with minimal bilateral carotid stenosis. The patient has had a slight increase on the right when compared to previous studies. The patient knows the signs and symptoms of CVA and knows to report to the nearest emergency department should that occur. The patient's questions were encouraged and answered, he is in agreement with this plan. Next  Beatris Ship ANP   Clinic MD: Oneida Alar

## 2012-01-13 ENCOUNTER — Encounter: Payer: Self-pay | Admitting: Neurosurgery

## 2012-01-13 NOTE — Addendum Note (Signed)
Addended by: Mena Goes on: 01/13/2012 08:34 AM   Modules accepted: Orders

## 2012-01-15 ENCOUNTER — Other Ambulatory Visit: Payer: Self-pay | Admitting: Internal Medicine

## 2012-02-23 ENCOUNTER — Encounter: Payer: Self-pay | Admitting: Gastroenterology

## 2012-03-03 ENCOUNTER — Other Ambulatory Visit: Payer: Self-pay | Admitting: Internal Medicine

## 2012-03-12 ENCOUNTER — Other Ambulatory Visit (INDEPENDENT_AMBULATORY_CARE_PROVIDER_SITE_OTHER): Payer: Managed Care, Other (non HMO)

## 2012-03-12 DIAGNOSIS — E785 Hyperlipidemia, unspecified: Secondary | ICD-10-CM

## 2012-03-12 DIAGNOSIS — I739 Peripheral vascular disease, unspecified: Secondary | ICD-10-CM

## 2012-03-12 LAB — HEPATIC FUNCTION PANEL
ALT: 35 U/L (ref 0–53)
AST: 29 U/L (ref 0–37)
Alkaline Phosphatase: 56 U/L (ref 39–117)
Bilirubin, Direct: 0 mg/dL (ref 0.0–0.3)
Total Protein: 6.6 g/dL (ref 6.0–8.3)

## 2012-03-12 LAB — BASIC METABOLIC PANEL
CO2: 25 mEq/L (ref 19–32)
Calcium: 8.4 mg/dL (ref 8.4–10.5)
Chloride: 106 mEq/L (ref 96–112)
Potassium: 4.3 mEq/L (ref 3.5–5.1)
Sodium: 137 mEq/L (ref 135–145)

## 2012-03-12 LAB — LIPID PANEL: Total CHOL/HDL Ratio: 4

## 2012-03-19 ENCOUNTER — Ambulatory Visit (INDEPENDENT_AMBULATORY_CARE_PROVIDER_SITE_OTHER): Payer: Managed Care, Other (non HMO) | Admitting: Internal Medicine

## 2012-03-19 ENCOUNTER — Encounter: Payer: Self-pay | Admitting: Internal Medicine

## 2012-03-19 VITALS — BP 142/80 | HR 60 | Temp 98.2°F | Resp 16 | Ht 69.5 in | Wt 197.0 lb

## 2012-03-19 DIAGNOSIS — E785 Hyperlipidemia, unspecified: Secondary | ICD-10-CM

## 2012-03-19 DIAGNOSIS — IMO0002 Reserved for concepts with insufficient information to code with codable children: Secondary | ICD-10-CM

## 2012-03-19 DIAGNOSIS — I10 Essential (primary) hypertension: Secondary | ICD-10-CM

## 2012-03-19 MED ORDER — BUTALBITAL-APAP-CAFFEINE 50-500-40 MG PO TABS: 1.0000 | ORAL_TABLET | Freq: Four times a day (QID) | ORAL | Status: DC | PRN

## 2012-03-19 NOTE — Progress Notes (Signed)
Subjective:    Patient ID: Kevin [name], male    DOB: [birth year], 71 y.o.   MRN: [id number]  HPI  Patient is a 71 year old male who presents for followup of hyperlipidemia.  He has an acute complaint today of chronic low back pain that is no longer responsive to the Ultram.  He is still working he states during the day he has breakthrough pain it does not occur everyday it is radicular in nature and radiates down the left leg.    Review of Systems  Constitutional: Negative for fever and fatigue.  HENT: Negative for hearing loss, congestion, neck pain and postnasal drip.   Eyes: Negative for discharge, redness and visual disturbance.  Respiratory: Negative for cough, shortness of breath and wheezing.   Cardiovascular: Negative for leg swelling.  Gastrointestinal: Negative for abdominal pain, constipation and abdominal distention.  Genitourinary: Negative for urgency and frequency.  Musculoskeletal: Positive for myalgias, back pain and arthralgias. Negative for joint swelling.  Skin: Negative for color change and rash.  Neurological: Negative for weakness and light-headedness.  Hematological: Negative for adenopathy.  Psychiatric/Behavioral: Negative for behavioral problems.   Past Medical History  Diagnosis Date  . Hyperlipidemia   . Hypertension   . Myocardial infarction June 2013     X 2 mini stroke    History   Social History  . Marital Status: Married    Spouse Name: N/A    Number of Children: N/A  . Years of Education: N/A   Occupational History  . Not on file.   Social History Main Topics  . Smoking status: Former Smoker    Quit date: 05/30/1990  . Smokeless tobacco: Not on file  . Alcohol Use: No  . Drug Use: No  . Sexually Active: Yes   Other Topics Concern  . Not on file   Social History Narrative  . No narrative on file    Past Surgical History  Procedure Date  . Testicular cyst   . Carotid endarterectomy 09/20/2004    Left  CEA  . Abdominal  aortic aneurysm repair     Family History  Problem Relation Age of Onset  . Parkinsonism Father   . Dementia Father   . Colon cancer Mother   . Cancer Mother     No Known Allergies  Current Outpatient Prescriptions on File Prior to Visit  Medication Sig Dispense Refill  . amLODipine-olmesartan (AZOR) 5-40 MG per tablet Take 1 tablet by mouth daily.      . Ascorbic Acid (VITAMIN C) 500 MG CHEW Chew by mouth daily.        . AZOR 5-40 MG per tablet TAKE ONE TABLET BY MOUTH DAILY  30 each  6  . bisoprolol-hydrochlorothiazide (ZIAC) 10-6.25 MG per tablet Take 1 tablet by mouth daily.      . clopidogrel (PLAVIX) 75 MG tablet Take 1 tablet (75 mg total) by mouth daily with breakfast.  90 tablet  3  . diclofenac (VOLTAREN) 75 MG EC tablet Take 75 mg by mouth 2 (two) times daily as needed. For pain      . ezetimibe-simvastatin (VYTORIN) 10-40 MG per tablet Take 1 tablet by mouth daily.      . Melatonin 3 MG TABS Take 1 tablet by mouth at bedtime.        . niacin (NIASPAN) 500 MG CR tablet Take 500 mg by mouth daily.        . OMEGA 3 1000 MG CAPS Take 2 capsules by mouth  daily.        . Saw Palmetto, Serenoa repens, 1000 MG CAPS Take by mouth daily.        . Tamsulosin HCl (FLOMAX) 0.4 MG CAPS Take by mouth daily after supper.        Marland Kitchen tiZANidine (ZANAFLEX) 4 MG tablet TAKE ONE TABLET BY MOUTH EVERY 6 HOURS AS NEEDED.  30 tablet  0    BP 142/80  Pulse 60  Temp 98.2 F (36.8 C)  Resp 16  Ht 5' 9.5" (1.765 m)  Wt 197 lb (89.359 kg)  BMI 28.67 kg/m2       Objective:   Physical Exam  Nursing note and vitals reviewed. Constitutional: He appears well-developed and well-nourished.  HENT:  Head: Normocephalic and atraumatic.  Eyes: Conjunctivae normal are normal. Pupils are equal, round, and reactive to light.  Neck: Normal range of motion. Neck supple.  Cardiovascular: Normal rate and regular rhythm.   Pulmonary/Chest: Effort normal and breath sounds normal.  Abdominal: Soft. Bowel  sounds are normal.          Assessment & Plan:  He does not want narcotic pain medicine at all possible we will try to switch him from the Ultram to Esgic-Plus for back pain. His lipids are improved at this point he has a good LDL and his HDL is at goal his triglycerides are improved we discussed continuing to limit sweets as part of our strategy for lowering his overall triglycerides.  His blood pressure is stable his current medications he'll followup in 4 months time

## 2012-03-19 NOTE — Patient Instructions (Addendum)
Since the tramadol is no longer effective for your back pain we will try Esgic-Plus you can take it every 6-8 hours as needed for pain during the day

## 2012-03-20 ENCOUNTER — Other Ambulatory Visit: Payer: Self-pay | Admitting: *Deleted

## 2012-03-20 MED ORDER — TAMSULOSIN HCL 0.4 MG PO CAPS: 0.4000 mg | ORAL_CAPSULE | ORAL | Status: DC

## 2012-03-28 ENCOUNTER — Other Ambulatory Visit: Payer: Self-pay | Admitting: Internal Medicine

## 2012-03-29 ENCOUNTER — Telehealth: Payer: Self-pay | Admitting: Internal Medicine

## 2012-03-29 MED ORDER — BUTALBITAL-APAP-CAFFEINE 50-325-40 MG PO TABS: 1.0000 | ORAL_TABLET | Freq: Two times a day (BID) | ORAL | Status: DC | PRN

## 2012-03-29 NOTE — Telephone Encounter (Signed)
Called in esgic-lower dosage

## 2012-03-29 NOTE — Telephone Encounter (Signed)
Pt wife stated hus was given rx for new med on 03-19-2012. Pharm said med not longer come in mg on rx. Pt does not know name of med. [facility]

## 2012-04-05 ENCOUNTER — Other Ambulatory Visit: Payer: Self-pay | Admitting: Internal Medicine

## 2012-04-06 ENCOUNTER — Other Ambulatory Visit: Payer: Self-pay | Admitting: Internal Medicine

## 2012-04-17 ENCOUNTER — Encounter: Payer: Self-pay | Admitting: Internal Medicine

## 2012-04-17 ENCOUNTER — Ambulatory Visit (INDEPENDENT_AMBULATORY_CARE_PROVIDER_SITE_OTHER): Payer: Managed Care, Other (non HMO) | Admitting: Internal Medicine

## 2012-04-17 VITALS — BP 160/86 | HR 80 | Temp 98.3°F | Resp 16 | Ht 69.5 in | Wt 194.0 lb

## 2012-04-17 DIAGNOSIS — K4090 Unilateral inguinal hernia, without obstruction or gangrene, not specified as recurrent: Secondary | ICD-10-CM

## 2012-04-17 NOTE — Progress Notes (Signed)
  Subjective:    Patient ID: Kevin [name], male    DOB: [birth year], 71 y.o.   MRN: [id number]  HPI Patient is a 71 year old male followed for hypertension he presents today with acute complaint of groin pain going down the left leg.  States he feels pain in the medial aspects of the left leg extending down to the knee he points to the femoral triangle area. He does not have a protruding mass Pain is intermittent Pain is worsened by walking at work and climbing up and down ladder    Review of Systems  Constitutional: Negative for fever and fatigue.  HENT: Negative for hearing loss, congestion, neck pain and postnasal drip.   Eyes: Negative for discharge, redness and visual disturbance.  Respiratory: Negative for cough, shortness of breath and wheezing.   Cardiovascular: Negative for leg swelling.  Gastrointestinal: Negative for abdominal pain, constipation and abdominal distention.  Genitourinary: Negative for urgency and frequency.  Musculoskeletal: Negative for joint swelling and arthralgias.  Skin: Negative for color change and rash.  Neurological: Negative for weakness and light-headedness.  Hematological: Negative for adenopathy.  Psychiatric/Behavioral: Negative for behavioral problems.       Objective:   Physical Exam  Nursing note and vitals reviewed. Constitutional: He appears well-developed and well-nourished.  HENT:  Head: Normocephalic and atraumatic.  Eyes: Conjunctivae normal are normal. Pupils are equal, round, and reactive to light.  Neck: Normal range of motion. Neck supple.  Cardiovascular: Normal rate and regular rhythm.   Pulmonary/Chest: Effort normal and breath sounds normal.  Abdominal: Soft. Bowel sounds are normal.  Genitourinary:       Moderate left inguinal hernia          Assessment & Plan:  Moderate left inguinal hernia. Do not appreciate a significant hernia in the canal but I do appreciate herniation above the canal. Referral for general  consult from  general surgery

## 2012-04-17 NOTE — Patient Instructions (Signed)
The patient is instructed to continue all medications as prescribed. Schedule followup with check out clerk upon leaving the clinic  

## 2012-05-01 ENCOUNTER — Encounter (INDEPENDENT_AMBULATORY_CARE_PROVIDER_SITE_OTHER): Payer: Self-pay | Admitting: Surgery

## 2012-05-01 ENCOUNTER — Ambulatory Visit (INDEPENDENT_AMBULATORY_CARE_PROVIDER_SITE_OTHER): Payer: Commercial Indemnity | Admitting: Surgery

## 2012-05-01 VITALS — BP 136/80 | HR 69 | Temp 97.2°F | Resp 18 | Ht 69.0 in | Wt 195.0 lb

## 2012-05-01 DIAGNOSIS — K4090 Unilateral inguinal hernia, without obstruction or gangrene, not specified as recurrent: Secondary | ICD-10-CM

## 2012-05-01 NOTE — Patient Instructions (Signed)
Hernia A hernia occurs when an internal organ pushes out through a weak spot in the abdominal wall. Hernias most commonly occur in the groin and around the navel. Hernias often can be pushed back into place (reduced). Most hernias tend to get worse over time. Some abdominal hernias can get stuck in the opening (irreducible or incarcerated hernia) and cannot be reduced. An irreducible abdominal hernia which is tightly squeezed into the opening is at risk for impaired blood supply (strangulated hernia). A strangulated hernia is a medical emergency. Because of the risk for an irreducible or strangulated hernia, surgery may be recommended to repair a hernia. CAUSES   Heavy lifting.  Prolonged coughing.  Straining to have a bowel movement.  A cut (incision) made during an abdominal surgery. HOME CARE INSTRUCTIONS   Bed rest is not required. You may continue your normal activities.  Avoid lifting more than 10 pounds (4.5 kg) or straining.  Cough gently. If you are a smoker it is best to stop. Even the best hernia repair can break down with the continual strain of coughing. Even if you do not have your hernia repaired, a cough will continue to aggravate the problem.  Do not wear anything tight over your hernia. Do not try to keep it in with an outside bandage or truss. These can damage abdominal contents if they are trapped within the hernia sac.  Eat a normal diet.  Avoid constipation. Straining over long periods of time will increase hernia size and encourage breakdown of repairs. If you cannot do this with diet alone, stool softeners may be used. SEEK IMMEDIATE MEDICAL CARE IF:   You have a fever.  You develop increasing abdominal pain.  You feel nauseous or vomit.  Your hernia is stuck outside the abdomen, looks discolored, feels hard, or is tender.  You have any changes in your bowel habits or in the hernia that are unusual for you.  You have increased pain or swelling around the  hernia.  You cannot push the hernia back in place by applying gentle pressure while lying down. MAKE SURE YOU:   Understand these instructions.  Will watch your condition.  Will get help right away if you are not doing well or get worse. Document Released: 05/16/2005 Document Revised: 08/08/2011 Document Reviewed: 01/03/2008 [employer] Patient Information 2013 [employer].  

## 2012-05-01 NOTE — Progress Notes (Signed)
Patient ID: Kevin [name], male   DOB: 08-22-1940, 71 y.o.   MRN: [id number]  Chief Complaint  Patient presents with  . New Evaluation    L/H    HPI Kevin [name] is a 71 y.o. male.  Patient sent at request of Dr. Arnoldo Morale for left inguinal hernia. This was recently diagnosed. He denies any significant left groin pain. He has a very small left groin bulge. It does not interfere with his quality of life. It is not causing any significant pain or discomfort currently. He denies in change in bowel or bladder function. HPI  Past Medical History  Diagnosis Date  . Hyperlipidemia   . Hypertension   . Myocardial infarction June 2013     X 2 mini stroke    Past Surgical History  Procedure Date  . Testicular cyst   . Carotid endarterectomy 09/20/2004    Left  CEA  . Abdominal aortic aneurysm repair   . Abdominal aortic aneurysm repair     2010  . Artery repair     neck appx 5 yrs ago    Family History  Problem Relation Age of Onset  . Parkinsonism Father   . Dementia Father   . Colon cancer Mother   . Cancer Mother     Social History History  Substance Use Topics  . Smoking status: Former Smoker    Quit date: 05/30/1990  . Smokeless tobacco: Not on file  . Alcohol Use: No    No Known Allergies  Current Outpatient Prescriptions  Medication Sig Dispense Refill  . amLODipine-olmesartan (AZOR) 5-40 MG per tablet Take 1 tablet by mouth daily.      . Ascorbic Acid (VITAMIN C) 500 MG CHEW Chew by mouth daily.        . AZOR 5-40 MG per tablet TAKE ONE TABLET BY MOUTH DAILY  30 each  6  . bisoprolol-hydrochlorothiazide (ZIAC) 10-6.25 MG per tablet Take 1 tablet by mouth daily.      . butalbital-acetaminophen-caffeine (ESGIC) 50-325-40 MG per tablet Take 1 tablet by mouth 2 (two) times daily as needed for headache.  30 tablet  2  . clopidogrel (PLAVIX) 75 MG tablet Take 1 tablet (75 mg total) by mouth daily with breakfast.  90 tablet  3  . diclofenac (VOLTAREN) 75 MG EC tablet Take  75 mg by mouth 2 (two) times daily as needed. For pain      . ezetimibe-simvastatin (VYTORIN) 10-40 MG per tablet Take 1 tablet by mouth daily.      . Melatonin 3 MG TABS Take 1 tablet by mouth at bedtime.        . niacin (NIASPAN) 500 MG CR tablet Take 500 mg by mouth daily.        . OMEGA 3 1000 MG CAPS Take 2 capsules by mouth daily.        . Saw Palmetto, Serenoa repens, 1000 MG CAPS Take by mouth daily.        . Tamsulosin HCl (FLOMAX) 0.4 MG CAPS Take 1 capsule (0.4 mg total) by mouth daily after supper.  90 capsule  3  . tiZANidine (ZANAFLEX) 4 MG tablet TAKE ONE TABLET BY MOUTH EVERY 6 HOURS AS NEEDED  30 tablet  0  . tiZANidine (ZANAFLEX) 4 MG tablet TAKE ONE TABLET BY MOUTH EVERY 6 HOURS AS NEEDED  30 tablet  0  . traMADol (ULTRAM) 50 MG tablet         Review of Systems Review of  Systems  Constitutional: Negative for fever, chills and unexpected weight change.  HENT: Negative for hearing loss, congestion, sore throat, trouble swallowing and voice change.   Eyes: Negative for visual disturbance.  Respiratory: Negative for cough and wheezing.   Cardiovascular: Negative for chest pain, palpitations and leg swelling.  Gastrointestinal: Negative for nausea, vomiting, abdominal pain, diarrhea, constipation, blood in stool, abdominal distention, anal bleeding and rectal pain.  Genitourinary: Negative for hematuria and difficulty urinating.  Musculoskeletal: Negative for arthralgias.  Skin: Negative for rash and wound.  Neurological: Negative for seizures, syncope, weakness and headaches.  Hematological: Negative for adenopathy. Does not bruise/bleed easily.  Psychiatric/Behavioral: Negative for confusion.    Blood pressure 136/80, pulse 69, temperature 97.2 F (36.2 C), temperature source Temporal, resp. rate 18, height 5\' 9"  (1.753 m), weight 195 lb (88.451 kg).  Physical Exam Physical Exam  Constitutional: He is oriented to person, place, and time. He appears well-developed and  well-nourished.  HENT:  Head: Normocephalic and atraumatic.  Eyes: EOM are normal. Pupils are equal, round, and reactive to light.  Neck: Normal range of motion. Neck supple.  Cardiovascular: Normal rate and regular rhythm.   Pulmonary/Chest: Effort normal and breath sounds normal.  Abdominal: Soft. There is no tenderness.    Musculoskeletal: Normal range of motion.  Neurological: He is alert and oriented to person, place, and time.  Skin: Skin is warm and dry.  Psychiatric: He has a normal mood and affect. His behavior is normal. Thought content normal.       Assessment    Left inguinal hernia reducible     Plan    This was  discuss with patient. Open and laparoscopic repair options. The use of mesh. The pros and cons of each as well as complications of surgical intervention discussed. He would like to proceed in the future with repair  Of his left anal hernia. He would like to wait until next year. He will call to schedule a time to come back in to set this up. Symptoms of incarceration and strangulation discussed and information given to patient.       Maybell Misenheimer A. 05/01/2012, 12:00 PM

## 2012-05-07 ENCOUNTER — Other Ambulatory Visit: Payer: Self-pay | Admitting: Internal Medicine

## 2012-06-11 ENCOUNTER — Other Ambulatory Visit: Payer: Self-pay | Admitting: Internal Medicine

## 2012-07-07 ENCOUNTER — Other Ambulatory Visit: Payer: Self-pay | Admitting: Internal Medicine

## 2012-07-23 ENCOUNTER — Telehealth: Payer: Self-pay | Admitting: Internal Medicine

## 2012-07-23 ENCOUNTER — Ambulatory Visit (INDEPENDENT_AMBULATORY_CARE_PROVIDER_SITE_OTHER): Payer: Medicare Other | Admitting: Internal Medicine

## 2012-07-23 ENCOUNTER — Encounter: Payer: Self-pay | Admitting: Internal Medicine

## 2012-07-23 VITALS — BP 140/80 | HR 60 | Temp 98.5°F | Resp 16 | Ht 69.0 in | Wt 201.0 lb

## 2012-07-23 DIAGNOSIS — I10 Essential (primary) hypertension: Secondary | ICD-10-CM

## 2012-07-23 DIAGNOSIS — E785 Hyperlipidemia, unspecified: Secondary | ICD-10-CM

## 2012-07-23 MED ORDER — EZETIMIBE 10 MG PO TABS: 10.0000 mg | ORAL_TABLET | Freq: Every day | ORAL | Status: DC

## 2012-07-23 MED ORDER — ATORVASTATIN CALCIUM 20 MG PO TABS: 20.0000 mg | ORAL_TABLET | Freq: Every day | ORAL | Status: DC

## 2012-07-23 MED ORDER — AMLODIPINE BESY-BENAZEPRIL HCL 5-40 MG PO CAPS: 1.0000 | ORAL_CAPSULE | Freq: Every day | ORAL | Status: DC

## 2012-07-23 NOTE — Progress Notes (Signed)
Subjective:    Patient ID: Kevin [name], male    DOB: 1940-12-25, 72 y.o.   MRN: [id number]  HPI The patient is retiring. Needing 90 day refills for medications Has new formulary and needs change in prescritions Blood pressure stable    Review of Systems  Constitutional: Negative for fever and fatigue.  HENT: Negative for hearing loss, congestion, neck pain and postnasal drip.   Eyes: Negative for discharge, redness and visual disturbance.  Respiratory: Negative for cough, shortness of breath and wheezing.   Cardiovascular: Negative for leg swelling.  Gastrointestinal: Negative for abdominal pain, constipation and abdominal distention.  Genitourinary: Negative for urgency and frequency.  Musculoskeletal: Negative for joint swelling and arthralgias.  Skin: Negative for color change and rash.  Neurological: Negative for weakness and light-headedness.  Hematological: Negative for adenopathy.  Psychiatric/Behavioral: Negative for behavioral problems.   Past Medical History  Diagnosis Date  . Hyperlipidemia   . Hypertension   . Myocardial infarction June 2013     X 2 mini stroke    History   Social History  . Marital Status: Married    Spouse Name: N/A    Number of Children: N/A  . Years of Education: N/A   Occupational History  . Not on file.   Social History Main Topics  . Smoking status: Former Smoker    Quit date: 05/30/1990  . Smokeless tobacco: Not on file  . Alcohol Use: No  . Drug Use: No  . Sexually Active: Yes   Other Topics Concern  . Not on file   Social History Narrative  . No narrative on file    Past Surgical History  Procedure Laterality Date  . Testicular cyst    . Carotid endarterectomy  09/20/2004    Left  CEA  . Abdominal aortic aneurysm repair    . Abdominal aortic aneurysm repair      2010  . Artery repair      neck appx 5 yrs ago    Family History  Problem Relation Age of Onset  . Parkinsonism Father   . Dementia Father   .  Colon cancer Mother   . Cancer Mother     No Known Allergies  Current Outpatient Prescriptions on File Prior to Visit  Medication Sig Dispense Refill  . amLODipine-olmesartan (AZOR) 5-40 MG per tablet Take 1 tablet by mouth daily.      . Ascorbic Acid (VITAMIN C) 500 MG CHEW Chew by mouth daily.        . AZOR 5-40 MG per tablet TAKE ONE TABLET BY MOUTH DAILY  30 each  6  . bisoprolol-hydrochlorothiazide (ZIAC) 10-6.25 MG per tablet Take 1 tablet by mouth daily.      . butalbital-acetaminophen-caffeine (ESGIC) 50-325-40 MG per tablet Take 1 tablet by mouth 2 (two) times daily as needed for headache.  30 tablet  2  . clopidogrel (PLAVIX) 75 MG tablet Take 1 tablet (75 mg total) by mouth daily with breakfast.  90 tablet  3  . diclofenac (VOLTAREN) 75 MG EC tablet Take 75 mg by mouth 2 (two) times daily as needed. For pain      . Melatonin 3 MG TABS Take 1 tablet by mouth at bedtime.        . niacin (NIASPAN) 500 MG CR tablet Take 500 mg by mouth daily.        . OMEGA 3 1000 MG CAPS Take 2 capsules by mouth daily.        Marland Kitchen  Saw Palmetto, Serenoa repens, 1000 MG CAPS Take by mouth daily.        . Tamsulosin HCl (FLOMAX) 0.4 MG CAPS Take 1 capsule (0.4 mg total) by mouth daily after supper.  90 capsule  3  . tiZANidine (ZANAFLEX) 4 MG tablet TAKE ONE TABLET BY MOUTH EVERY 6 HOURS AS NEEDED  30 tablet  0  . tiZANidine (ZANAFLEX) 4 MG tablet TAKE ONE TABLET BY MOUTH EVERY 6 HOURS AS NEEDED  30 tablet  0  . traMADol (ULTRAM) 50 MG tablet       . VYTORIN 10-40 MG per tablet TAKE ONE TABLET BY MOUTH EVERY DAY  30 tablet  11   No current facility-administered medications on file prior to visit.    BP 140/80  Pulse 60  Temp(Src) 98.5 F (36.9 C)  Resp 16  Ht 5\' 9"  (1.753 m)  Wt 201 lb (91.173 kg)  BMI 29.67 kg/m2       Objective:   Physical Exam  Vitals reviewed. Constitutional: He appears well-developed and well-nourished.  HENT:  Head: Normocephalic and atraumatic.  Eyes:  Conjunctivae are normal. Pupils are equal, round, and reactive to light.  Neck: Normal range of motion. Neck supple.  Cardiovascular: Normal rate and regular rhythm.   Pulmonary/Chest: Effort normal and breath sounds normal.  Abdominal: Soft. Bowel sounds are normal.          Assessment & Plan:  Due to formulary changes we need to change with his medications to generic.  We will change to Vytorin 10-42 Lipitor 20 generic and Zetia 10 generic.  We will discontinue the Azor and we'll begin amlodipine 5 and benazepril 40 He understands the changes are necessary due to his insurance but agrees to monitor carefully his blood pressure on the new medications. We will do a lipid liver and may be met at the time of his wellness examination in 5 months

## 2012-07-23 NOTE — Patient Instructions (Addendum)
The patient is instructed to continue all medications as prescribed. Schedule followup with check out clerk upon leaving the clinic  

## 2012-07-23 NOTE — Telephone Encounter (Signed)
done

## 2012-07-23 NOTE — Telephone Encounter (Signed)
Please send pt scripts to [facility].  Pt just here today.

## 2012-08-05 ENCOUNTER — Other Ambulatory Visit: Payer: Self-pay | Admitting: Internal Medicine

## 2012-08-08 ENCOUNTER — Other Ambulatory Visit: Payer: Self-pay | Admitting: Internal Medicine

## 2012-09-07 ENCOUNTER — Other Ambulatory Visit: Payer: Self-pay | Admitting: Internal Medicine

## 2012-10-15 ENCOUNTER — Other Ambulatory Visit: Payer: Self-pay | Admitting: Internal Medicine

## 2012-11-21 ENCOUNTER — Other Ambulatory Visit: Payer: Self-pay | Admitting: *Deleted

## 2012-11-21 MED ORDER — TIZANIDINE HCL 4 MG PO TABS: ORAL_TABLET | ORAL | Status: DC

## 2012-11-21 MED ORDER — BISOPROLOL-HYDROCHLOROTHIAZIDE 10-6.25 MG PO TABS: ORAL_TABLET | ORAL | Status: DC

## 2012-11-21 MED ORDER — TRAMADOL HCL 50 MG PO TABS: ORAL_TABLET | ORAL | Status: DC

## 2012-12-26 ENCOUNTER — Other Ambulatory Visit: Payer: Self-pay | Admitting: Internal Medicine

## 2012-12-26 ENCOUNTER — Ambulatory Visit (INDEPENDENT_AMBULATORY_CARE_PROVIDER_SITE_OTHER): Payer: Medicare Other | Admitting: Internal Medicine

## 2012-12-26 ENCOUNTER — Encounter: Payer: Self-pay | Admitting: Internal Medicine

## 2012-12-26 ENCOUNTER — Telehealth: Payer: Self-pay | Admitting: Internal Medicine

## 2012-12-26 VITALS — BP 150/86 | HR 60 | Temp 98.2°F | Resp 16 | Ht 69.0 in | Wt 186.0 lb

## 2012-12-26 DIAGNOSIS — I10 Essential (primary) hypertension: Secondary | ICD-10-CM

## 2012-12-26 DIAGNOSIS — N401 Enlarged prostate with lower urinary tract symptoms: Secondary | ICD-10-CM

## 2012-12-26 DIAGNOSIS — T887XXA Unspecified adverse effect of drug or medicament, initial encounter: Secondary | ICD-10-CM

## 2012-12-26 DIAGNOSIS — Z0000 Encounter for general adult medical examination without abnormal findings: Secondary | ICD-10-CM

## 2012-12-26 DIAGNOSIS — M169 Osteoarthritis of hip, unspecified: Secondary | ICD-10-CM

## 2012-12-26 DIAGNOSIS — M25559 Pain in unspecified hip: Secondary | ICD-10-CM

## 2012-12-26 DIAGNOSIS — E785 Hyperlipidemia, unspecified: Secondary | ICD-10-CM

## 2012-12-26 LAB — CBC WITH DIFFERENTIAL/PLATELET
Basophils Absolute: 0 10*3/uL (ref 0.0–0.1)
Eosinophils Relative: 3 % (ref 0.0–5.0)
MCV: 90.9 fl (ref 78.0–100.0)
Monocytes Absolute: 0.6 10*3/uL (ref 0.1–1.0)
Monocytes Relative: 6.5 % (ref 3.0–12.0)
Neutrophils Relative %: 66.7 % (ref 43.0–77.0)
Platelets: 172 10*3/uL (ref 150.0–400.0)
RDW: 13.9 % (ref 11.5–14.6)
WBC: 9.6 10*3/uL (ref 4.5–10.5)

## 2012-12-26 LAB — BASIC METABOLIC PANEL
CO2: 25 mEq/L (ref 19–32)
Calcium: 9.3 mg/dL (ref 8.4–10.5)
Chloride: 103 mEq/L (ref 96–112)
Creatinine, Ser: 1.7 mg/dL — ABNORMAL HIGH (ref 0.4–1.5)
Sodium: 136 mEq/L (ref 135–145)

## 2012-12-26 LAB — HEPATIC FUNCTION PANEL
Alkaline Phosphatase: 74 U/L (ref 39–117)
Bilirubin, Direct: 0 mg/dL (ref 0.0–0.3)

## 2012-12-26 LAB — LIPID PANEL
HDL: 45 mg/dL (ref >39.00)
Triglycerides: 1069 mg/dL — ABNORMAL HIGH (ref 0.0–149.0)
VLDL: 213.8 mg/dL — ABNORMAL HIGH (ref 0.0–40.0)

## 2012-12-26 LAB — LDL CHOLESTEROL, DIRECT: Direct LDL: 134.9 mg/dL

## 2012-12-26 LAB — PSA: PSA: 1.28 ng/mL (ref 0.10–4.00)

## 2012-12-26 LAB — TSH: TSH: 0.84 u[IU]/mL (ref 0.35–5.50)

## 2012-12-26 MED ORDER — TRAMADOL HCL 50 MG PO TABS: 50.0000 mg | ORAL_TABLET | Freq: Four times a day (QID) | ORAL | Status: DC | PRN

## 2012-12-26 MED ORDER — AMLODIPINE BESYLATE 5 MG PO TABS: 5.0000 mg | ORAL_TABLET | Freq: Every day | ORAL | Status: DC

## 2012-12-26 MED ORDER — FENOFIBRATE 145 MG PO TABS: 145.0000 mg | ORAL_TABLET | Freq: Every day | ORAL | Status: DC

## 2012-12-26 NOTE — Telephone Encounter (Signed)
done

## 2012-12-26 NOTE — Telephone Encounter (Signed)
Pharmacy calling to clarify directions for traMADol (ULTRAM) 50 MG tablet rx received today.

## 2012-12-26 NOTE — Patient Instructions (Signed)
3 new drugs have been prescribed today one is amlodipine for your blood pressure.  One is TriCor for your cholesterol and one is Ultram for pain

## 2012-12-26 NOTE — Addendum Note (Signed)
Addended by: Allyne Gee on: 12/26/2012 04:54 PM   Modules accepted: Orders

## 2012-12-26 NOTE — Progress Notes (Signed)
Subjective:    Patient ID: Kevin [name], male    DOB: [birth year], 72 y.o.   MRN: [id number]  HPI Patient has not been able to take certain of his blood pressure medicines due to cost and his blood pressure is elevated at 150/8062 his history of an aortic aneurysm repair we need to control his blood pressure more effectively we will replace the Lotrel with amlodipine and monitor his blood pressure.  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  The patient has been on niacin for hyperlipidemia and DDS right ear DD was too costly and the niacin has been shown to be not effective were placed the niacin and Zetia with a generic bile salt binder   Review of Systems  Constitutional: Negative for fever and fatigue.  HENT: Negative for hearing loss, congestion, neck pain and postnasal drip.   Eyes: Negative for discharge, redness and visual disturbance.  Respiratory: Positive for shortness of breath. Negative for cough and wheezing.   Cardiovascular: Negative for leg swelling.  Gastrointestinal: Negative for abdominal pain, constipation and abdominal distention.  Genitourinary: Negative for urgency and frequency.  Musculoskeletal: Positive for joint swelling, arthralgias and gait problem.  Skin: Negative for color change and rash.  Neurological: Negative for weakness and light-headedness.  Hematological: Negative for adenopathy.  Psychiatric/Behavioral: Negative for behavioral problems.   Past Medical History  Diagnosis Date  . Hyperlipidemia   . Hypertension   . Myocardial infarction June 2013     X 2 mini stroke    History   Social History  . Marital Status: Married    Spouse Name: N/A    Number of Children: N/A  . Years of Education: N/A   Occupational History  . Not on file.   Social History Main Topics  . Smoking status: Former Smoker    Quit date: 05/30/1990  . Smokeless tobacco: Not on file  . Alcohol Use: No  . Drug  Use: No  . Sexually Active: Yes   Other Topics Concern  . Not on file   Social History Narrative  . No narrative on file    Past Surgical History  Procedure Laterality Date  . Testicular cyst    . Carotid endarterectomy  09/20/2004    Left  CEA  . Abdominal aortic aneurysm repair    . Abdominal aortic aneurysm repair      2010  . Artery repair      neck appx 5 yrs ago    Family History  Problem Relation Age of Onset  . Parkinsonism Father   . Dementia Father   . Colon cancer Mother   . Cancer Mother     No Known Allergies  Current Outpatient Prescriptions on File Prior to Visit  Medication Sig Dispense Refill  . Ascorbic Acid (VITAMIN C) 500 MG CHEW Chew by mouth daily.        . bisoprolol-hydrochlorothiazide (ZIAC) 10-6.25 MG per tablet Take 1 tablet by mouth daily.      . butalbital-acetaminophen-caffeine (ESGIC) 50-325-40 MG per tablet Take 1 tablet by mouth 2 (two) times daily as needed for headache.  30 tablet  2  . diclofenac (VOLTAREN) 75 MG EC tablet Take 75 mg by mouth 2 (two) times daily as needed. For pain      . Melatonin 3 MG TABS Take 1 tablet by mouth at bedtime.        . OMEGA 3 1000 MG CAPS Take 2 capsules by  mouth daily.        . Saw Palmetto, Serenoa repens, 1000 MG CAPS Take by mouth daily.        . Tamsulosin HCl (FLOMAX) 0.4 MG CAPS Take 1 capsule (0.4 mg total) by mouth daily after supper.  90 capsule  3  . tiZANidine (ZANAFLEX) 4 MG tablet TAKE ONE TABLET BY MOUTH EVERY 6 HOURS AS NEEDED  30 tablet  2  . atorvastatin (LIPITOR) 20 MG tablet Take 1 tablet (20 mg total) by mouth daily.  90 tablet  3   No current facility-administered medications on file prior to visit.    BP 150/86  Pulse 60  Temp(Src) 98.2 F (36.8 C)  Resp 16  Ht 5\' 9"  (1.753 m)  Wt 186 lb (84.369 kg)  BMI 27.45 kg/m2       Objective:   Physical Exam  Constitutional: He appears well-developed and well-nourished.  HENT:  Head: Normocephalic and atraumatic.  Eyes:  Conjunctivae are normal. Pupils are equal, round, and reactive to light.  Neck: Normal range of motion. Neck supple.  Cardiovascular: Normal rate and regular rhythm.   Murmur heard. Pulmonary/Chest: Effort normal and breath sounds normal.  Abdominal: Soft. Bowel sounds are normal.  Musculoskeletal: He exhibits edema and tenderness.          Assessment & Plan:  Begin Lopid 1 by mouth daily for cholesterol.  Change the Lotrel to amlodipine 5 mg by mouth daily and continued as he had.  Begin Ultram 50 mg every 6 hours when necessary pain Subjective:    Kevin [name] is a 72 y.o. male who presents for a welcome to Medicare exam.   Cardiac risk factors: dyslipidemia, hypertension and male gender.  Depression Screen (Note: if answer to either of the following is "Yes", a more complete depression screening is indicated)  Q1: Over the past two weeks, have you felt down, depressed or hopeless? no Q2: Over the past two weeks, have you felt little interest or pleasure in doing things? no  Activities of Daily Living In your present state of health, do you have any difficulty performing the following activities?:  Preparing food and eating?: No Bathing yourself: No Getting dressed: No Using the toilet:No Moving around from place to place: No In the past year have you fallen or had a near fall?:No  Current exercise habits: The patient does not participate in regular exercise at present.  Dietary issues discussed: weight control  Hearing difficulties: No Safe in current home environment: yes  The following portions of the patient's history were reviewed and updated as appropriate: allergies, current medications, past family history, past medical history, past social history, past surgical history and problem list. Review of Systems Pertinent items are noted in HPI.    Objective:     Vision by Snellen chart: right eye:20/20, left eye:20/20 Blood pressure 150/86, pulse 60,  temperature 98.2 F (36.8 C), resp. rate 16, height 5\' 9"  (1.753 m), weight 186 lb (84.369 kg). Body mass index is 27.45 kg/(m^2). BP 150/86  Pulse 60  Temp(Src) 98.2 F (36.8 C)  Resp 16  Ht 5\' 9"  (1.753 m)  Wt 186 lb (84.369 kg)  BMI 27.45 kg/m2  General Appearance:    Alert, cooperative, no distress, appears stated age  Head:    Normocephalic, without obvious abnormality, atraumatic  Eyes:    PERRL, conjunctiva/corneas clear, EOM's intact, fundi    benign, both eyes       Ears:    Normal TM's  and external ear canals, both ears  Nose:   Nares normal, septum midline, mucosa normal, no drainage    or sinus tenderness  Throat:   Lips, mucosa, and tongue normal; teeth and gums normal  Neck:   Supple, symmetrical, trachea midline, no adenopathy;       thyroid:  No enlargement/tenderness/nodules; no carotid   bruit or JVD  Back:     Symmetric, no curvature, ROM normal, no CVA tenderness  Lungs:     Clear to auscultation bilaterally, respirations unlabored  Chest wall:    No tenderness or deformity  Heart:    Regular rate and rhythm, S1 and S2 normal, no murmur, rub   or gallop  Abdomen:     Soft, non-tender, bowel sounds active all four quadrants,    no masses, no organomegaly  Genitalia:    Normal male without lesion, discharge or tenderness  Rectal:    Normal tone, normal prostate, no masses or tenderness;   guaiac negative stool  Extremities:   Extremities normal, atraumatic, no cyanosis or edema  Pulses:   2+ and symmetric all extremities  Skin:   Skin color, texture, turgor normal, no rashes or lesions  Lymph nodes:   Cervical, supraclavicular, and axillary nodes normal  Neurologic:   CNII-XII intact. Normal strength, sensation and reflexes      throughout      Assessment:      Patient presents for yearly preventative medicine examination.   all immunizations and health maintenance protocols were reviewed with the patient and they are up to date with these protocols.    screening laboratory values were reviewed with the patient including screening of hyperlipidemia PSA renal function and hepatic function.   There medications past medical history social history problem list and allergies were reviewed in detail.   Goals were established with regard to weight loss exercise diet in compliance with medications      Plan:     During the course of the visit the patient was educated and counseled about appropriate screening and preventive services including:   Influenza vaccine  Prostate cancer screening  Patient Instructions (the written plan) was given to the patient.

## 2013-01-16 ENCOUNTER — Encounter: Payer: Self-pay | Admitting: Family

## 2013-01-17 ENCOUNTER — Other Ambulatory Visit (INDEPENDENT_AMBULATORY_CARE_PROVIDER_SITE_OTHER): Payer: Medicare Other | Admitting: *Deleted

## 2013-01-17 ENCOUNTER — Encounter: Payer: Self-pay | Admitting: Family

## 2013-01-17 ENCOUNTER — Encounter (INDEPENDENT_AMBULATORY_CARE_PROVIDER_SITE_OTHER): Payer: Medicare Other | Admitting: *Deleted

## 2013-01-17 ENCOUNTER — Ambulatory Visit (INDEPENDENT_AMBULATORY_CARE_PROVIDER_SITE_OTHER): Payer: Medicare Other | Admitting: Family

## 2013-01-17 VITALS — BP 167/66 | HR 45 | Resp 16 | Ht 69.5 in | Wt 185.0 lb

## 2013-01-17 DIAGNOSIS — I739 Peripheral vascular disease, unspecified: Secondary | ICD-10-CM

## 2013-01-17 DIAGNOSIS — I6529 Occlusion and stenosis of unspecified carotid artery: Secondary | ICD-10-CM

## 2013-01-17 DIAGNOSIS — I714 Abdominal aortic aneurysm, without rupture: Secondary | ICD-10-CM

## 2013-01-17 DIAGNOSIS — I713 Abdominal aortic aneurysm, ruptured, unspecified: Secondary | ICD-10-CM | POA: Insufficient documentation

## 2013-01-17 DIAGNOSIS — Z48812 Encounter for surgical aftercare following surgery on the circulatory system: Secondary | ICD-10-CM

## 2013-01-17 NOTE — Progress Notes (Signed)
[facility] [place]  Established Open AAA Repair  History of Present Illness  Kevin [name] is a 72 y.o. ([birth year]) male who presents for routine follow up s/p OAR (2010, Dr. Oneida Alar) and left CEA (2006).   The patient has  had left lower quadrant chronic abdominal pain that he attributes to an inguinal hernia, states Dr. Arnoldo Morale aware. Has chronic right hip pain and low back pain; his hip pain is scheduled to be evaluated by Dr. Ricard Dillon. Had TIA last year: mild left facial and left leg tingling that lasted about 30 minutes, was evaluated at [facility] ED. Denies claudication symptoms.   Past Medical History  Diagnosis Date  . Hyperlipidemia   . Hypertension   . Myocardial infarction June 2013     X 2 mini stroke  . Stroke 2013    Mini stroke    Past Surgical History  Procedure Laterality Date  . Testicular cyst    . Abdominal aortic aneurysm repair    . Abdominal aortic aneurysm repair      2010  . Artery repair      neck appx 5 yrs ago  . Carotid endarterectomy  09/20/2004    Left  CEA   History   Social History  . Marital Status: Married    Spouse Name: N/A    Number of Children: N/A  . Years of Education: N/A   Occupational History  . Not on file.   Social History Main Topics  . Smoking status: Former Smoker    Quit date: 05/30/1990  . Smokeless tobacco: Never Used  . Alcohol Use: No  . Drug Use: No  . Sexual Activity: Yes   Other Topics Concern  . Not on file   Social History Narrative  . No narrative on file   Family History  Problem Relation Age of Onset  . Parkinsonism Father   . Dementia Father   . Colon cancer Mother   . Cancer Mother    Current Outpatient Prescriptions on File Prior to Visit  Medication Sig Dispense Refill  . amLODipine (NORVASC) 5 MG tablet Take 1 tablet (5 mg total) by mouth daily.  90 tablet  3  . Ascorbic Acid (VITAMIN C) 500 MG CHEW Chew by mouth daily.        .  bisoprolol-hydrochlorothiazide (ZIAC) 10-6.25 MG per tablet Take 1 tablet by mouth daily.      . butalbital-acetaminophen-caffeine (ESGIC) 50-325-40 MG per tablet Take 1 tablet by mouth 2 (two) times daily as needed for headache.  30 tablet  2  . diclofenac (VOLTAREN) 75 MG EC tablet TAKE ONE TABLET BY MOUTH TWICE DAILY AS NEEDED  60 tablet  0  . fenofibrate (TRICOR) 145 MG tablet Take 1 tablet (145 mg total) by mouth daily.  30 tablet  11  . Melatonin 3 MG TABS Take 1 tablet by mouth at bedtime.        . OMEGA 3 1000 MG CAPS Take 2 capsules by mouth daily.        . Saw Palmetto, Serenoa repens, 1000 MG CAPS Take by mouth daily.        . Tamsulosin HCl (FLOMAX) 0.4 MG CAPS Take 1 capsule (0.4 mg total) by mouth daily after supper.  90 capsule  3  . tiZANidine (ZANAFLEX) 4 MG tablet TAKE ONE TABLET BY MOUTH EVERY 6 HOURS AS NEEDED  30 tablet  2  . traMADol (ULTRAM) 50 MG tablet Take 1 tablet (50  mg total) by mouth every 6 (six) hours as needed for pain. TAKE ONE TABLET BY MOUTH EVERY 8 HOURS AS NEEDED FOR PAIN  90 tablet  5  . diclofenac (VOLTAREN) 75 MG EC tablet Take 75 mg by mouth 2 (two) times daily as needed. For pain       No current facility-administered medications on file prior to visit.   No Known Allergies  ROS: [x]  Positive   [ ]  Negative   [ ]  All sytems reviewed and are negative  General: [x ] Weight loss, 25 pounds since April, 2013, unintentional, Dr. Arnoldo Morale aware, [ ]  Fever, [ ]  chills Neurologic: [ ]  Dizziness, [ ]  Blackouts, [ ]  Seizure [ ]  Stroke, Valu.Nieves ] "Mini stroke" (2013), [ ]  Slurred speech, [ ]  Temporary blindness; [ ]  weakness in arms or legs, [ ]  Hoarseness Cardiac: [ ]  Chest pain/pressure, [ ]  Shortness of breath at rest [ ]  Shortness of breath with exertion, [ ]  Atrial fibrillation or irregular heartbeat Vascular: [ ]  Pain in legs with walking, [ ]  Pain in legs at rest, [ ]  Pain in legs at night,  [ ]  Non-healing ulcer, [ ]  Blood clot in vein/DVT,   Pulmonary: [ ]   Home oxygen, [ ]  Productive cough, [ ]  Coughing up blood, [ ]  Asthma,  [ ]  Wheezing Musculoskeletal:  Valu.Nieves ] Arthritis, Valu.Nieves ] Low back pain, Valu.Nieves ] Joint pain Hematologic: [ ]  Easy Bruising, [ ]  Anemia; [ ]  Hepatitis Gastrointestinal: [ ]  Blood in stool, [ ]  Gastroesophageal Reflux/heartburn, [ ]  Trouble swallowing Urinary: [ ]  chronic Kidney disease, [ ]  on HD - [ ]  MWF or [ ]  TTHS, [ ]  Burning with urination, Valu.Nieves ] Difficulty urinating Skin: [ ]  Rashes, [ ]  Wounds Psychological: [ ]  Anxiety, [ ]  Depression    Physical Examination  Filed Vitals:   01/17/13 1419 01/17/13 1422  BP: 183/71 167/66  Pulse: 45 45  Resp: 16   Height: 5' 9.5" (1.765 m)   Weight: 185 lb (83.915 kg)   SpO2: 99%    Body mass index is 26.94 kg/(m^2).  General: A&O x 3, WD.   Pulmonary: Sym exp, good air movt, CTAB, no rales, rhonchi, & wheezing.   Cardiac: RRR, Nl S1, S2, no Murmurs, rubs or gallops  Vascular: Vessel Right Left  Radial Palpable Palpable  Carotid audible, without bruit audible, without bruit  Aorta Not palpable N/A  Femoral Palpable Palpable  Popliteal  palpable  palpable  PT Palpable Palpable  DP Palpable Not Palpable   Gastrointestinal: soft, NTND, -G/R, - HSM, - masses, - CVAT B.  Musculoskeletal: M/S 5/5 throughout, Extremities without ischemic changes.  Neurologic: Pain and light touch intact in extremities, Motor exam as listed above  Non-Invasive Vascular Imaging  Carotid Duplex No significant change in a year: 40-59% RICA stenosis; less than [id number] LICA stenosis with patent CEA site.  ABI (Date: 01/17/2013)  Right: PTA .74 DPA .74, TBI .48 with dampened waveforms.  Left: PTA .84, DPA .83, TBI .57 with dampened waveforms.  Medical Decision Making  Kevin [name] is a 72 y.o. male who presents s/p OAR and left CEA.  Pt is asymptomatic. He has  I discussed with the patient the importance of surveillance. I discussed in depth with the patient the nature of atherosclerosis,  and emphasized the importance of maximal medical management including strict control of blood pressure, blood glucose, and lipid levels, obtaining regular exercise, and continued cessation of smoking.  The patient is  aware that without maximal medical management the underlying atherosclerotic disease process will progress, limiting the benefit of any interventions.  The next ABI and bilateral carotid Duplex will be scheduled for 12 months.  The patient will follow up with [place] in 12 months with these studies.  Thank you for allowing [place] to participate in this patient's care.  Clemon Chambers, RN, MSN, FNP-C [facility] [place] Office: [facility] Physician: Oneida Alar   01/17/2013, 2:30 PM

## 2013-01-21 ENCOUNTER — Telehealth: Payer: Self-pay | Admitting: Internal Medicine

## 2013-01-21 NOTE — Telephone Encounter (Signed)
Error/njr °

## 2013-01-22 ENCOUNTER — Other Ambulatory Visit: Payer: Self-pay | Admitting: *Deleted

## 2013-01-22 MED ORDER — DICLOFENAC SODIUM 75 MG PO TBEC: DELAYED_RELEASE_TABLET | ORAL | Status: DC

## 2013-01-30 ENCOUNTER — Ambulatory Visit (INDEPENDENT_AMBULATORY_CARE_PROVIDER_SITE_OTHER): Payer: Medicare Other | Admitting: Surgery

## 2013-01-30 ENCOUNTER — Encounter (HOSPITAL_BASED_OUTPATIENT_CLINIC_OR_DEPARTMENT_OTHER): Payer: Self-pay | Admitting: *Deleted

## 2013-01-30 ENCOUNTER — Encounter (INDEPENDENT_AMBULATORY_CARE_PROVIDER_SITE_OTHER): Payer: Self-pay | Admitting: Surgery

## 2013-01-30 VITALS — BP 160/80 | HR 52 | Resp 14 | Ht 69.0 in | Wt 185.0 lb

## 2013-01-30 DIAGNOSIS — K4090 Unilateral inguinal hernia, without obstruction or gangrene, not specified as recurrent: Secondary | ICD-10-CM

## 2013-01-30 NOTE — Progress Notes (Signed)
Patient ID: Kevin [name], male   DOB: February 02, 1941, 72 y.o.   MRN: [id number]  No chief complaint on file.   HPI Kevin [name] is a 72 y.o. male.  Patient sent at request of Dr. Arnoldo Morale for left inguinal hernia. This was recently diagnosed. He denies any significant left groin pain. He has a very small left groin bulge. It does not interfere with his quality of life. It is not causing any significant pain or discomfort currently. He denies in change in bowel or bladder function. HPI  Past Medical History  Diagnosis Date  . Hyperlipidemia   . Hypertension   . Myocardial infarction June 2013     X 2 mini stroke  . Stroke 2013    Mini stroke    Past Surgical History  Procedure Laterality Date  . Testicular cyst    . Abdominal aortic aneurysm repair    . Abdominal aortic aneurysm repair      2010  . Artery repair      neck appx 5 yrs ago  . Carotid endarterectomy  09/20/2004    Left  CEA    Family History  Problem Relation Age of Onset  . Parkinsonism Father   . Dementia Father   . Colon cancer Mother   . Cancer Mother     Social History History  Substance Use Topics  . Smoking status: Former Smoker    Quit date: 05/30/1990  . Smokeless tobacco: Never Used  . Alcohol Use: No    No Known Allergies  Current Outpatient Prescriptions  Medication Sig Dispense Refill  . amLODipine (NORVASC) 5 MG tablet Take 1 tablet (5 mg total) by mouth daily.  90 tablet  3  . Ascorbic Acid (VITAMIN C) 500 MG CHEW Chew by mouth daily.        Marland Kitchen atorvastatin (LIPITOR) 20 MG tablet Take 20 mg by mouth daily.      . bisoprolol-hydrochlorothiazide (ZIAC) 10-6.25 MG per tablet Take 1 tablet by mouth daily.      . butalbital-acetaminophen-caffeine (ESGIC) 50-325-40 MG per tablet Take 1 tablet by mouth 2 (two) times daily as needed for headache.  30 tablet  2  . diclofenac (VOLTAREN) 75 MG EC tablet Take 75 mg by mouth 2 (two) times daily as needed. For pain      . diclofenac (VOLTAREN) 75 MG EC  tablet TAKE ONE TABLET BY MOUTH TWICE DAILY AS NEEDED  60 tablet  6  . fenofibrate (TRICOR) 145 MG tablet Take 1 tablet (145 mg total) by mouth daily.  30 tablet  11  . Melatonin 3 MG TABS Take 1 tablet by mouth at bedtime.        . OMEGA 3 1000 MG CAPS Take 2 capsules by mouth daily.        . Saw Palmetto, Serenoa repens, 1000 MG CAPS Take by mouth daily.        . Tamsulosin HCl (FLOMAX) 0.4 MG CAPS Take 1 capsule (0.4 mg total) by mouth daily after supper.  90 capsule  3  . tiZANidine (ZANAFLEX) 4 MG tablet TAKE ONE TABLET BY MOUTH EVERY 6 HOURS AS NEEDED  30 tablet  2  . traMADol (ULTRAM) 50 MG tablet Take 1 tablet (50 mg total) by mouth every 6 (six) hours as needed for pain. TAKE ONE TABLET BY MOUTH EVERY 8 HOURS AS NEEDED FOR PAIN  90 tablet  5   No current facility-administered medications for this visit.    Review of  Systems Review of Systems  Constitutional: Negative for fever, chills and unexpected weight change.  HENT: Negative for hearing loss, congestion, sore throat, trouble swallowing and voice change.   Eyes: Negative for visual disturbance.  Respiratory: Negative for cough and wheezing.   Cardiovascular: Negative for chest pain, palpitations and leg swelling.  Gastrointestinal: Negative for nausea, vomiting, abdominal pain, diarrhea, constipation, blood in stool, abdominal distention, anal bleeding and rectal pain.  Genitourinary: Negative for hematuria and difficulty urinating.  Musculoskeletal: Negative for arthralgias.  Skin: Negative for rash and wound.  Neurological: Negative for seizures, syncope, weakness and headaches.  Hematological: Negative for adenopathy. Does not bruise/bleed easily.  Psychiatric/Behavioral: Negative for confusion.    Blood pressure 160/80, pulse 52, resp. rate 14, height 5\' 9"  (1.753 m), weight 185 lb (83.915 kg).  Physical Exam Physical Exam  Constitutional: He is oriented to person, place, and time. He appears well-developed and  well-nourished.  HENT:  Head: Normocephalic and atraumatic.  Eyes: EOM are normal. Pupils are equal, round, and reactive to light.  Neck: Normal range of motion. Neck supple.  Cardiovascular: Normal rate and regular rhythm.   Pulmonary/Chest: Effort normal and breath sounds normal.  Abdominal: Soft. There is no tenderness.    Musculoskeletal: Normal range of motion.  Neurological: He is alert and oriented to person, place, and time.  Skin: Skin is warm and dry.  Psychiatric: He has a normal mood and affect. His behavior is normal. Thought content normal.       Assessment    Left inguinal hernia reducible     Plan    This was  discuss with patient. Open and laparoscopic repair options. The use of mesh. The pros and cons of each as well as complications of surgical intervention discussed. He would like to proceed in the future with repair  Of his left inguinal  hernia. He is ready to schedule.The risk of hernia repair include bleeding,  Infection,   Recurrence of the hernia,  Mesh use, chronic pain,  Organ injury,  Bowel injury,  Bladder injury,   nerve injury with numbness around the incision,  Death,  and worsening of preexisting  medical problems.  The alternatives to surgery have been discussed as well..  Long term expectations of both operative and non operative treatments have been discussed.   The patient agrees to proceed.   Satara Virella A. 01/30/2013, 12:00 PM

## 2013-01-30 NOTE — Patient Instructions (Signed)

## 2013-01-30 NOTE — Progress Notes (Signed)
To come in for ccs labs and ekg Does not see cardiology-only dr Arnoldo Morale and Baker Janus

## 2013-02-04 ENCOUNTER — Encounter (HOSPITAL_BASED_OUTPATIENT_CLINIC_OR_DEPARTMENT_OTHER)
Admission: RE | Admit: 2013-02-04 | Discharge: 2013-02-04 | Disposition: A | Discharge status: Home / Self Care | Payer: Medicare Other | Source: Ambulatory Visit | Attending: Surgery | Admitting: Surgery

## 2013-02-04 DIAGNOSIS — E785 Hyperlipidemia, unspecified: Secondary | ICD-10-CM | POA: Diagnosis not present

## 2013-02-04 DIAGNOSIS — Z8673 Personal history of transient ischemic attack (TIA), and cerebral infarction without residual deficits: Secondary | ICD-10-CM | POA: Diagnosis not present

## 2013-02-04 DIAGNOSIS — I10 Essential (primary) hypertension: Secondary | ICD-10-CM | POA: Diagnosis not present

## 2013-02-04 DIAGNOSIS — Z79899 Other long term (current) drug therapy: Secondary | ICD-10-CM | POA: Diagnosis not present

## 2013-02-04 DIAGNOSIS — K4090 Unilateral inguinal hernia, without obstruction or gangrene, not specified as recurrent: Secondary | ICD-10-CM | POA: Diagnosis not present

## 2013-02-04 DIAGNOSIS — Z87891 Personal history of nicotine dependence: Secondary | ICD-10-CM | POA: Diagnosis not present

## 2013-02-04 DIAGNOSIS — I252 Old myocardial infarction: Secondary | ICD-10-CM | POA: Diagnosis not present

## 2013-02-04 DIAGNOSIS — I739 Peripheral vascular disease, unspecified: Secondary | ICD-10-CM | POA: Diagnosis not present

## 2013-02-04 LAB — COMPREHENSIVE METABOLIC PANEL
Albumin: 4.1 g/dL (ref 3.5–5.2)
Alkaline Phosphatase: 45 U/L (ref 39–117)
BUN: 17 mg/dL (ref 6–23)
CO2: 23 mEq/L (ref 19–32)
Chloride: 101 mEq/L (ref 96–112)
Creatinine, Ser: 1.69 mg/dL — ABNORMAL HIGH (ref 0.50–1.35)
GFR calc Af Amer: 44 mL/min — ABNORMAL LOW (ref >90)
GFR calc non Af Amer: 38 mL/min — ABNORMAL LOW (ref >90)
Glucose, Bld: 131 mg/dL — ABNORMAL HIGH (ref 70–99)
Potassium: 3.9 mEq/L (ref 3.5–5.1)
Total Bilirubin: 0.3 mg/dL (ref 0.3–1.2)

## 2013-02-04 LAB — CBC WITH DIFFERENTIAL/PLATELET
Basophils Relative: 1 % (ref 0–1)
HCT: 35.1 % — ABNORMAL LOW (ref 39.0–52.0)
Hemoglobin: 12.1 g/dL — ABNORMAL LOW (ref 13.0–17.0)
Lymphs Abs: 2.6 10*3/uL (ref 0.7–4.0)
MCHC: 34.5 g/dL (ref 30.0–36.0)
Monocytes Absolute: 0.8 10*3/uL (ref 0.1–1.0)
Monocytes Relative: 8 % (ref 3–12)
Neutro Abs: 6.7 10*3/uL (ref 1.7–7.7)
Neutrophils Relative %: 65 % (ref 43–77)
RBC: 4.07 MIL/uL — ABNORMAL LOW (ref 4.22–5.81)

## 2013-02-05 ENCOUNTER — Encounter (HOSPITAL_BASED_OUTPATIENT_CLINIC_OR_DEPARTMENT_OTHER): Payer: Self-pay | Admitting: Anesthesiology

## 2013-02-05 ENCOUNTER — Telehealth: Payer: Self-pay | Admitting: Internal Medicine

## 2013-02-05 ENCOUNTER — Ambulatory Visit (HOSPITAL_BASED_OUTPATIENT_CLINIC_OR_DEPARTMENT_OTHER)
Admission: RE | Admit: 2013-02-05 | Discharge: 2013-02-05 | Disposition: A | Discharge status: Home / Self Care | Payer: Medicare Other | Source: Ambulatory Visit | Attending: Surgery | Admitting: Surgery

## 2013-02-05 ENCOUNTER — Ambulatory Visit (HOSPITAL_BASED_OUTPATIENT_CLINIC_OR_DEPARTMENT_OTHER): Payer: Medicare Other | Admitting: Anesthesiology

## 2013-02-05 ENCOUNTER — Encounter (HOSPITAL_BASED_OUTPATIENT_CLINIC_OR_DEPARTMENT_OTHER)
Admission: RE | Disposition: A | Discharge status: Home / Self Care | Payer: Self-pay | Source: Ambulatory Visit | Attending: Surgery

## 2013-02-05 ENCOUNTER — Encounter (HOSPITAL_BASED_OUTPATIENT_CLINIC_OR_DEPARTMENT_OTHER): Payer: Self-pay | Admitting: *Deleted

## 2013-02-05 DIAGNOSIS — M25561 Pain in right knee: Secondary | ICD-10-CM

## 2013-02-05 DIAGNOSIS — G250 Essential tremor: Secondary | ICD-10-CM

## 2013-02-05 DIAGNOSIS — I739 Peripheral vascular disease, unspecified: Secondary | ICD-10-CM | POA: Insufficient documentation

## 2013-02-05 DIAGNOSIS — H900 Conductive hearing loss, bilateral: Secondary | ICD-10-CM

## 2013-02-05 DIAGNOSIS — K4090 Unilateral inguinal hernia, without obstruction or gangrene, not specified as recurrent: Secondary | ICD-10-CM

## 2013-02-05 DIAGNOSIS — R7301 Impaired fasting glucose: Secondary | ICD-10-CM

## 2013-02-05 DIAGNOSIS — I252 Old myocardial infarction: Secondary | ICD-10-CM | POA: Insufficient documentation

## 2013-02-05 DIAGNOSIS — IMO0002 Reserved for concepts with insufficient information to code with codable children: Secondary | ICD-10-CM

## 2013-02-05 DIAGNOSIS — E785 Hyperlipidemia, unspecified: Secondary | ICD-10-CM | POA: Insufficient documentation

## 2013-02-05 DIAGNOSIS — Z79899 Other long term (current) drug therapy: Secondary | ICD-10-CM | POA: Insufficient documentation

## 2013-02-05 DIAGNOSIS — N139 Obstructive and reflux uropathy, unspecified: Secondary | ICD-10-CM

## 2013-02-05 DIAGNOSIS — M76899 Other specified enthesopathies of unspecified lower limb, excluding foot: Secondary | ICD-10-CM

## 2013-02-05 DIAGNOSIS — Z8673 Personal history of transient ischemic attack (TIA), and cerebral infarction without residual deficits: Secondary | ICD-10-CM | POA: Insufficient documentation

## 2013-02-05 DIAGNOSIS — D649 Anemia, unspecified: Secondary | ICD-10-CM

## 2013-02-05 DIAGNOSIS — Z87891 Personal history of nicotine dependence: Secondary | ICD-10-CM | POA: Insufficient documentation

## 2013-02-05 DIAGNOSIS — I639 Cerebral infarction, unspecified: Secondary | ICD-10-CM

## 2013-02-05 DIAGNOSIS — L570 Actinic keratosis: Secondary | ICD-10-CM

## 2013-02-05 DIAGNOSIS — M1710 Unilateral primary osteoarthritis, unspecified knee: Secondary | ICD-10-CM

## 2013-02-05 DIAGNOSIS — G5762 Lesion of plantar nerve, left lower limb: Secondary | ICD-10-CM

## 2013-02-05 DIAGNOSIS — I10 Essential (primary) hypertension: Secondary | ICD-10-CM | POA: Insufficient documentation

## 2013-02-05 DIAGNOSIS — Z9889 Other specified postprocedural states: Secondary | ICD-10-CM

## 2013-02-05 DIAGNOSIS — I713 Abdominal aortic aneurysm, ruptured: Secondary | ICD-10-CM

## 2013-02-05 HISTORY — PX: INGUINAL HERNIA REPAIR: SHX194

## 2013-02-05 HISTORY — DX: Unspecified osteoarthritis, unspecified site: M19.90

## 2013-02-05 HISTORY — PX: INSERTION OF MESH: SHX5868

## 2013-02-05 LAB — POCT HEMOGLOBIN-HEMACUE: Hemoglobin: 12.3 g/dL — ABNORMAL LOW (ref 13.0–17.0)

## 2013-02-05 SURGERY — REPAIR, HERNIA, INGUINAL, ADULT
Anesthesia: General | Site: Groin | Laterality: Left | Wound class: Clean

## 2013-02-05 MED ORDER — DEXAMETHASONE SODIUM PHOSPHATE 4 MG/ML IJ SOLN
INTRAMUSCULAR | Status: DC | PRN
  Administered 2013-02-05: 10 mg via INTRAVENOUS

## 2013-02-05 MED ORDER — BUPIVACAINE-EPINEPHRINE PF 0.5-1:200000 % IJ SOLN
INTRAMUSCULAR | Status: DC | PRN
  Administered 2013-02-05: 30 mL

## 2013-02-05 MED ORDER — ONDANSETRON HCL 4 MG/2ML IJ SOLN
INTRAMUSCULAR | Status: DC | PRN
  Administered 2013-02-05: 4 mg via INTRAVENOUS

## 2013-02-05 MED ORDER — CHLORHEXIDINE GLUCONATE 4 % EX LIQD: 1.0000 "application " | Freq: Once | CUTANEOUS | Status: DC

## 2013-02-05 MED ORDER — LACTATED RINGERS IV SOLN
INTRAVENOUS | Status: DC
  Administered 2013-02-05 (×2): via INTRAVENOUS

## 2013-02-05 MED ORDER — OXYCODONE-ACETAMINOPHEN 5-325 MG PO TABS
1.0000 | ORAL_TABLET | ORAL | Status: AC | PRN
  Administered 2013-02-05: 1 via ORAL

## 2013-02-05 MED ORDER — FENTANYL CITRATE 0.05 MG/ML IJ SOLN
INTRAMUSCULAR | Status: DC | PRN
  Administered 2013-02-05: 100 ug via INTRAVENOUS

## 2013-02-05 MED ORDER — MIDAZOLAM HCL 5 MG/5ML IJ SOLN
INTRAMUSCULAR | Status: DC | PRN
  Administered 2013-02-05: 1 mg via INTRAVENOUS

## 2013-02-05 MED ORDER — PROPOFOL 10 MG/ML IV BOLUS
INTRAVENOUS | Status: DC | PRN
  Administered 2013-02-05: 150 mg via INTRAVENOUS

## 2013-02-05 MED ORDER — FENTANYL CITRATE 0.05 MG/ML IJ SOLN
50.0000 ug | INTRAMUSCULAR | Status: DC | PRN
  Administered 2013-02-05: 100 ug via INTRAVENOUS

## 2013-02-05 MED ORDER — CLOPIDOGREL BISULFATE 75 MG PO TABS: 75.0000 mg | ORAL_TABLET | Freq: Every day | ORAL | Status: DC

## 2013-02-05 MED ORDER — SUCCINYLCHOLINE CHLORIDE 20 MG/ML IJ SOLN
INTRAMUSCULAR | Status: DC | PRN
  Administered 2013-02-05: 100 mg via INTRAVENOUS

## 2013-02-05 MED ORDER — BUPIVACAINE-EPINEPHRINE 0.25% -1:200000 IJ SOLN
INTRAMUSCULAR | Status: DC | PRN
  Administered 2013-02-05: 5 mL

## 2013-02-05 MED ORDER — MIDAZOLAM HCL 2 MG/2ML IJ SOLN
1.0000 mg | INTRAMUSCULAR | Status: DC | PRN
  Administered 2013-02-05: 2 mg via INTRAVENOUS

## 2013-02-05 MED ORDER — DEXTROSE 5 % IV SOLN
3.0000 g | INTRAVENOUS | Status: AC
  Administered 2013-02-05: 2 g via INTRAVENOUS

## 2013-02-05 MED ORDER — LIDOCAINE HCL (CARDIAC) 20 MG/ML IV SOLN
INTRAVENOUS | Status: DC | PRN
  Administered 2013-02-05: 100 mg via INTRAVENOUS

## 2013-02-05 MED ORDER — OXYCODONE-ACETAMINOPHEN 5-325 MG PO TABS: 1.0000 | ORAL_TABLET | ORAL | Status: DC | PRN

## 2013-02-05 SURGICAL SUPPLY — 48 items
BLADE SURG 15 STRL LF DISP TIS (BLADE) ×1 IMPLANT
BLADE SURG 15 STRL SS (BLADE) ×1
BLADE SURG ROTATE 9660 (MISCELLANEOUS) ×2 IMPLANT
CANISTER SUCTION 1200CC (MISCELLANEOUS) ×2 IMPLANT
CHLORAPREP W/TINT 26ML (MISCELLANEOUS) ×2 IMPLANT
CLOTH BEACON ORANGE TIMEOUT ST (SAFETY) ×2 IMPLANT
COVER MAYO STAND STRL (DRAPES) ×2 IMPLANT
COVER TABLE BACK 60X90 (DRAPES) ×2 IMPLANT
DECANTER SPIKE VIAL GLASS SM (MISCELLANEOUS) ×2 IMPLANT
DERMABOND ADVANCED (GAUZE/BANDAGES/DRESSINGS) ×1
DERMABOND ADVANCED .7 DNX12 (GAUZE/BANDAGES/DRESSINGS) ×1 IMPLANT
DRAIN PENROSE 1/2X12 LTX STRL (WOUND CARE) ×2 IMPLANT
DRAPE LAPAROTOMY TRNSV 102X78 (DRAPE) ×2 IMPLANT
DRAPE UTILITY XL STRL (DRAPES) ×2 IMPLANT
ELECT COATED BLADE 2.86 ST (ELECTRODE) ×2 IMPLANT
ELECT REM PT RETURN 9FT ADLT (ELECTROSURGICAL) ×2
ELECTRODE REM PT RTRN 9FT ADLT (ELECTROSURGICAL) ×1 IMPLANT
GAUZE SPONGE 4X4 12PLY STRL LF (GAUZE/BANDAGES/DRESSINGS) IMPLANT
GAUZE SPONGE 4X4 16PLY XRAY LF (GAUZE/BANDAGES/DRESSINGS) IMPLANT
GLOVE BIOGEL PI IND STRL 8 (GLOVE) ×1 IMPLANT
GLOVE BIOGEL PI INDICATOR 8 (GLOVE) ×1
GLOVE ECLIPSE 8.0 STRL XLNG CF (GLOVE) ×2 IMPLANT
GLOVE SURG SS PI 7.0 STRL IVOR (GLOVE) ×2 IMPLANT
GOWN PREVENTION PLUS XLARGE (GOWN DISPOSABLE) ×4 IMPLANT
MESH HERNIA SYS ULTRAPRO LRG (Mesh General) ×2 IMPLANT
NEEDLE HYPO 25X1 1.5 SAFETY (NEEDLE) ×2 IMPLANT
NS IRRIG 1000ML POUR BTL (IV SOLUTION) ×2 IMPLANT
PACK BASIN DAY SURGERY FS (CUSTOM PROCEDURE TRAY) ×2 IMPLANT
PENCIL BUTTON HOLSTER BLD 10FT (ELECTRODE) ×2 IMPLANT
SLEEVE SCD COMPRESS KNEE MED (MISCELLANEOUS) ×2 IMPLANT
SPONGE LAP 4X18 X RAY DECT (DISPOSABLE) ×2 IMPLANT
STAPLER VISISTAT 35W (STAPLE) IMPLANT
SUT MON AB 4-0 PC3 18 (SUTURE) ×2 IMPLANT
SUT NOVA 0 T19/GS 22DT (SUTURE) ×6 IMPLANT
SUT VIC AB 0 SH 27 (SUTURE) ×2 IMPLANT
SUT VIC AB 2-0 SH 27 (SUTURE) ×1
SUT VIC AB 2-0 SH 27XBRD (SUTURE) ×1 IMPLANT
SUT VIC AB 3-0 54X BRD REEL (SUTURE) ×1 IMPLANT
SUT VIC AB 3-0 BRD 54 (SUTURE) ×1
SUT VICRYL 3-0 CR8 SH (SUTURE) ×2 IMPLANT
SUT VICRYL AB 2 0 TIE (SUTURE) IMPLANT
SUT VICRYL AB 2 0 TIES (SUTURE)
SYR CONTROL 10ML LL (SYRINGE) ×2 IMPLANT
TAPE HYPAFIX 4 X10 (GAUZE/BANDAGES/DRESSINGS) IMPLANT
TOWEL OR 17X24 6PK STRL BLUE (TOWEL DISPOSABLE) ×4 IMPLANT
TOWEL OR NON WOVEN STRL DISP B (DISPOSABLE) ×2 IMPLANT
TUBE CONNECTING 20X1/4 (TUBING) ×2 IMPLANT
YANKAUER SUCT BULB TIP NO VENT (SUCTIONS) ×2 IMPLANT

## 2013-02-05 NOTE — Op Note (Signed)
Left Inguinal Hernia, Open, Procedure Note with mesh  ([facility])  Indications: The patient presented with a history of a left, reducible  Inguinal hernia.  The risk of hernia repair include bleeding,  Infection,   Recurrence of the hernia,  Mesh use, chronic pain,  Organ injury,  Bowel injury,  Bladder injury,   nerve injury with numbness around the incision,  Death,  and worsening of preexisting  medical problems.  The alternatives to surgery have been discussed as well..  Long term expectations of both operative and non operative treatments have been discussed.   The patient agrees to proceed.  Pre-operative Diagnosis: left reducible inguinal hernia   Post-operative Diagnosis: same  Surgeon: Erroll Luna A.   Assistants: OR staff  Anesthesia: General endotracheal anesthesia and TAP block  ASA Class: 3  Procedure Details  The patient was seen again in the Holding Room. The risks, benefits, complications, treatment options, and expected outcomes were discussed with the patient. The possibilities of reaction to medication, pulmonary aspiration, perforation of viscus, bleeding, recurrent infection, the need for additional procedures, and development of a complication requiring transfusion or further operation were discussed with the patient and/or family. There was concurrence with the proposed plan, and informed consent was obtained. The site of surgery was properly noted/marked. The patient was taken to the Operating Room, identified as SETHE GALYEN, and the procedure verified as  Left inguinal hernia repair. A Time Out was held and the above information confirmed.  The patient was placed in the supine position and underwent induction of anesthesia, the  Left lower abdomen and groin was prepped and draped in the standard fashion, and 0.25% Marcaine with epinephrine was used to anesthetize the skin over the mid-portion of the left  inguinal canal. A transverse incision was made. Dissection was carried  through the soft tissue to expose the inguinal canal and inguinal ligament along its lower edge. The external oblique fascia was split along the course of its fibers, exposing the inguinal canal. The cord and nerve were looped using a Penrose drain and reflected out of the field. The defect was exposed and a piece of prolene hernia system ultrapro mesh was  placed into  the defect and deployed.  . Interupted 2-0 novafil suture vicryl  was then used  to repair the defect, with the suture being sewn from the pubic tubercle inferiorly and superiorly along the canal to a level just beyond the internal ring. The mesh was split to allow passage of the cord  into the canal without entrapment.  The ilioinguinal nerve divided since it was under the mesh and tethered.  The contents were then returned to canal and the external oblique fashion was then closed in a continuous fashion using 3-0 Vicryl suture taking care not to cause entrapment. Scarpa's layer closed with 3 0 vicryl and 4 0 monocryl used to close the skin.  Dermabond used for dressing.  Instrument, sponge, and needle counts were correct prior to closure and at the conclusion of the case.  Findings: Hernia as above  Estimated Blood Loss: Minimal         Drains: None         Total IV Fluids: 800 mL         Specimens: NONE               Complications: None; patient tolerated the procedure well.         Disposition: PACU - hemodynamically stable.  Condition: stable

## 2013-02-05 NOTE — Transfer of Care (Signed)
Immediate Anesthesia Transfer of Care Note  Patient: Kevin [name]  Procedure(s) Performed: Procedure(s): HERNIA REPAIR INGUINAL ADULT (Left) INSERTION OF MESH (Left)  Patient Location: PACU  Anesthesia Type:GA combined with regional for post-op pain  Level of Consciousness: awake, alert , oriented and patient cooperative  Airway & Oxygen Therapy: Patient Spontanous Breathing and Patient connected to face mask oxygen  Post-op Assessment: Report given to PACU RN and Post -op Vital signs reviewed and stable  Post vital signs: Reviewed and stable  Complications: No apparent anesthesia complications

## 2013-02-05 NOTE — Telephone Encounter (Signed)
Pt wife called to request a 3 month supply of his clopidogrel (PLAVIX) 75 MG tablet be sent to [facility] on [address]. They have told the pt for the last week that they have sent a request, and we have none of file. The pt is completely out. Please assist.

## 2013-02-05 NOTE — H&P (View-Only) (Signed)
Patient ID: Kevin [name], male   DOB: 04-Jun-1940, 72 y.o.   MRN: [id number]  No chief complaint on file.   HPI Kevin [name] is a 72 y.o. male.  Patient sent at request of Dr. Arnoldo Morale for left inguinal hernia. This was recently diagnosed. He denies any significant left groin pain. He has a very small left groin bulge. It does not interfere with his quality of life. It is not causing any significant pain or discomfort currently. He denies in change in bowel or bladder function. HPI  Past Medical History  Diagnosis Date  . Hyperlipidemia   . Hypertension   . Myocardial infarction June 2013     X 2 mini stroke  . Stroke 2013    Mini stroke    Past Surgical History  Procedure Laterality Date  . Testicular cyst    . Abdominal aortic aneurysm repair    . Abdominal aortic aneurysm repair      2010  . Artery repair      neck appx 5 yrs ago  . Carotid endarterectomy  09/20/2004    Left  CEA    Family History  Problem Relation Age of Onset  . Parkinsonism Father   . Dementia Father   . Colon cancer Mother   . Cancer Mother     Social History History  Substance Use Topics  . Smoking status: Former Smoker    Quit date: 05/30/1990  . Smokeless tobacco: Never Used  . Alcohol Use: No    No Known Allergies  Current Outpatient Prescriptions  Medication Sig Dispense Refill  . amLODipine (NORVASC) 5 MG tablet Take 1 tablet (5 mg total) by mouth daily.  90 tablet  3  . Ascorbic Acid (VITAMIN C) 500 MG CHEW Chew by mouth daily.        Marland Kitchen atorvastatin (LIPITOR) 20 MG tablet Take 20 mg by mouth daily.      . bisoprolol-hydrochlorothiazide (ZIAC) 10-6.25 MG per tablet Take 1 tablet by mouth daily.      . butalbital-acetaminophen-caffeine (ESGIC) 50-325-40 MG per tablet Take 1 tablet by mouth 2 (two) times daily as needed for headache.  30 tablet  2  . diclofenac (VOLTAREN) 75 MG EC tablet Take 75 mg by mouth 2 (two) times daily as needed. For pain      . diclofenac (VOLTAREN) 75 MG EC  tablet TAKE ONE TABLET BY MOUTH TWICE DAILY AS NEEDED  60 tablet  6  . fenofibrate (TRICOR) 145 MG tablet Take 1 tablet (145 mg total) by mouth daily.  30 tablet  11  . Melatonin 3 MG TABS Take 1 tablet by mouth at bedtime.        . OMEGA 3 1000 MG CAPS Take 2 capsules by mouth daily.        . Saw Palmetto, Serenoa repens, 1000 MG CAPS Take by mouth daily.        . Tamsulosin HCl (FLOMAX) 0.4 MG CAPS Take 1 capsule (0.4 mg total) by mouth daily after supper.  90 capsule  3  . tiZANidine (ZANAFLEX) 4 MG tablet TAKE ONE TABLET BY MOUTH EVERY 6 HOURS AS NEEDED  30 tablet  2  . traMADol (ULTRAM) 50 MG tablet Take 1 tablet (50 mg total) by mouth every 6 (six) hours as needed for pain. TAKE ONE TABLET BY MOUTH EVERY 8 HOURS AS NEEDED FOR PAIN  90 tablet  5   No current facility-administered medications for this visit.    Review of  Systems Review of Systems  Constitutional: Negative for fever, chills and unexpected weight change.  HENT: Negative for hearing loss, congestion, sore throat, trouble swallowing and voice change.   Eyes: Negative for visual disturbance.  Respiratory: Negative for cough and wheezing.   Cardiovascular: Negative for chest pain, palpitations and leg swelling.  Gastrointestinal: Negative for nausea, vomiting, abdominal pain, diarrhea, constipation, blood in stool, abdominal distention, anal bleeding and rectal pain.  Genitourinary: Negative for hematuria and difficulty urinating.  Musculoskeletal: Negative for arthralgias.  Skin: Negative for rash and wound.  Neurological: Negative for seizures, syncope, weakness and headaches.  Hematological: Negative for adenopathy. Does not bruise/bleed easily.  Psychiatric/Behavioral: Negative for confusion.    Blood pressure 160/80, pulse 52, resp. rate 14, height 5\' 9"  (1.753 m), weight 185 lb (83.915 kg).  Physical Exam Physical Exam  Constitutional: He is oriented to person, place, and time. He appears well-developed and  well-nourished.  HENT:  Head: Normocephalic and atraumatic.  Eyes: EOM are normal. Pupils are equal, round, and reactive to light.  Neck: Normal range of motion. Neck supple.  Cardiovascular: Normal rate and regular rhythm.   Pulmonary/Chest: Effort normal and breath sounds normal.  Abdominal: Soft. There is no tenderness.    Musculoskeletal: Normal range of motion.  Neurological: He is alert and oriented to person, place, and time.  Skin: Skin is warm and dry.  Psychiatric: He has a normal mood and affect. His behavior is normal. Thought content normal.       Assessment    Left inguinal hernia reducible     Plan    This was  discuss with patient. Open and laparoscopic repair options. The use of mesh. The pros and cons of each as well as complications of surgical intervention discussed. He would like to proceed in the future with repair  Of his left inguinal  hernia. He is ready to schedule.The risk of hernia repair include bleeding,  Infection,   Recurrence of the hernia,  Mesh use, chronic pain,  Organ injury,  Bowel injury,  Bladder injury,   nerve injury with numbness around the incision,  Death,  and worsening of preexisting  medical problems.  The alternatives to surgery have been discussed as well..  Long term expectations of both operative and non operative treatments have been discussed.   The patient agrees to proceed.   Erin Uecker A. 01/30/2013, 12:00 PM

## 2013-02-05 NOTE — Progress Notes (Signed)
Assisted Dr. Conrad [place] with left, ultrasound guided, transabdominal plane block. Side rails up, monitors on throughout procedure. See vital signs in flow sheet. Tolerated Procedure well.

## 2013-02-05 NOTE — Anesthesia Postprocedure Evaluation (Signed)
Anesthesia Post Note  Patient: Kevin [name]  Procedure(s) Performed: Procedure(s) (LRB): HERNIA REPAIR INGUINAL ADULT (Left) INSERTION OF MESH (Left)  Anesthesia type: general  Patient location: PACU  Post pain: Pain level controlled  Post assessment: Patient's Cardiovascular Status Stable  Last Vitals:  Filed Vitals:   02/05/13 1514  BP: 172/66  Pulse: 59  Temp: 36.8 C  Resp: 16    Post vital signs: Reviewed and stable  Level of consciousness: sedated  Complications: No apparent anesthesia complications

## 2013-02-05 NOTE — Anesthesia Procedure Notes (Addendum)
Anesthesia Regional Block:  TAP block  Pre-Anesthetic Checklist: ,, timeout performed, Correct Patient, Correct Site, Correct Laterality, Correct Procedure, Correct Position, site marked, Risks and benefits discussed,  Surgical consent,  Pre-op evaluation,  At surgeon's request and post-op pain management  Laterality: Left  Prep: chloraprep and alcohol swabs       Needles:  Injection technique: Single-shot  Needle Type: Echogenic Stimulator Needle     Needle Length: 10cm 10 cm Needle Gauge: 21 and 21 G    Additional Needles:  Procedures: ultrasound guided (picture in chart) TAP block Narrative:  Start time: 02/05/2013 10:40 AM End time: 02/05/2013 10:50 AM Injection made incrementally with aspirations every 5 mL.  Performed by: Personally  Anesthesiologist: Lillia Abed MD  Additional Notes: Monitors applied. Patient sedated. Sterile prep and drape,hand hygiene and sterile gloves were used. Relevant anatomy identified.Needle position confirmed.Local anesthetic injected incrementally after negative aspiration. Local anesthetic spread visualized in Transversus Abdominus Plane. Vascular puncture avoided. No complications. Image printed for medical record.The patient tolerated the procedure well.       TAP block Procedure Name: Intubation Date/Time: 02/05/2013 12:41 PM Performed by: [place] Pre-anesthesia Checklist: Patient identified, Emergency Drugs available, Suction available and Patient being monitored Patient Re-evaluated:Patient Re-evaluated prior to inductionOxygen Delivery Method: Circle System Utilized Preoxygenation: Pre-oxygenation with 100% oxygen Intubation Type: IV induction Ventilation: Mask ventilation without difficulty Laryngoscope Size: Miller and 2 Grade View: Grade I Tube type: Oral Tube size: 8.0 mm Number of attempts: 1 Airway Equipment and Method: oral airway Placement Confirmation: ETT inserted through vocal cords under direct vision,   positive ETCO2 and breath sounds checked- equal and bilateral Secured at: 22 cm Tube secured with: Tape Dental Injury: Teeth and Oropharynx as per pre-operative assessment

## 2013-02-05 NOTE — Anesthesia Preprocedure Evaluation (Signed)
Anesthesia Evaluation  Patient identified by MRN, date of birth, ID band Patient awake    Reviewed: Allergy & Precautions, H&P , NPO status , Patient's Chart, lab work & pertinent test results  Airway Mallampati: I TM Distance: >3 FB Neck ROM: Full    Dental   Pulmonary          Cardiovascular hypertension, Pt. on medications + Past MI and + Peripheral Vascular Disease     Neuro/Psych    GI/Hepatic   Endo/Other    Renal/GU      Musculoskeletal   Abdominal   Peds  Hematology   Anesthesia Other Findings   Reproductive/Obstetrics                           Anesthesia Physical Anesthesia Plan  ASA: III  Anesthesia Plan: General   Post-op Pain Management:    Induction: Intravenous  Airway Management Planned: LMA  Additional Equipment:   Intra-op Plan:   Post-operative Plan: Extubation in OR  Informed Consent: I have reviewed the patients History and Physical, chart, labs and discussed the procedure including the risks, benefits and alternatives for the proposed anesthesia with the patient or authorized representative who has indicated his/her understanding and acceptance.     Plan Discussed with: CRNA and Surgeon  Anesthesia Plan Comments:         Anesthesia Quick Evaluation

## 2013-02-05 NOTE — Interval H&P Note (Signed)
History and Physical Interval Note:  02/05/2013 12:28 PM  Kevin [name]  has presented today for surgery, with the diagnosis of hernia  The various methods of treatment have been discussed with the patient and family. After consideration of risks, benefits and other options for treatment, the patient has consented to  Procedure(s): HERNIA REPAIR INGUINAL ADULT (Left) INSERTION OF MESH (Left) as a surgical intervention .  The patient's history has been reviewed, patient examined, no change in status, stable for surgery.  I have reviewed the patient's chart and labs.  Questions were answered to the patient's satisfaction.     [name] A.

## 2013-02-05 NOTE — Telephone Encounter (Signed)
done

## 2013-02-06 ENCOUNTER — Encounter (HOSPITAL_BASED_OUTPATIENT_CLINIC_OR_DEPARTMENT_OTHER): Payer: Self-pay | Admitting: Surgery

## 2013-02-14 ENCOUNTER — Telehealth (INDEPENDENT_AMBULATORY_CARE_PROVIDER_SITE_OTHER): Payer: Self-pay

## 2013-02-14 DIAGNOSIS — G8918 Other acute postprocedural pain: Secondary | ICD-10-CM

## 2013-02-14 MED ORDER — HYDROCODONE-ACETAMINOPHEN 5-325 MG PO TABS: 1.0000 | ORAL_TABLET | Freq: Four times a day (QID) | ORAL | Status: DC | PRN

## 2013-02-14 NOTE — Telephone Encounter (Signed)
Patient called in for refill of pain medicine. Patient is 9 days post op hernia repair. This is first refill since surgery. Protocol is called into his pharmacy on file.

## 2013-02-22 ENCOUNTER — Encounter (INDEPENDENT_AMBULATORY_CARE_PROVIDER_SITE_OTHER): Payer: Self-pay | Admitting: Surgery

## 2013-02-22 ENCOUNTER — Ambulatory Visit (INDEPENDENT_AMBULATORY_CARE_PROVIDER_SITE_OTHER): Payer: Medicare Other | Admitting: Surgery

## 2013-02-22 VITALS — BP 140/86 | HR 80 | Temp 98.6°F | Resp 14 | Ht 69.0 in | Wt 183.0 lb

## 2013-02-22 DIAGNOSIS — Z9889 Other specified postprocedural states: Secondary | ICD-10-CM

## 2013-02-22 NOTE — Progress Notes (Signed)
Pt returns today after left inguinal  hernia repair.  Pain is well controlled.  Bowels are functioning.  Wound is clean.  On exam:  Incision is clean /dry/intact.  Area is soft without signs of hernia recurrence.  Impression:  Status repair of hernia left inguinal with mesh  Plan:  RTC PRN  Return to work in  1  Weeks no restrictions.

## 2013-02-22 NOTE — Patient Instructions (Signed)
Resume full activity in 1 week.return as needed.

## 2013-03-16 ENCOUNTER — Other Ambulatory Visit: Payer: Self-pay | Admitting: Internal Medicine

## 2013-03-19 ENCOUNTER — Other Ambulatory Visit: Payer: Self-pay | Admitting: Internal Medicine

## 2013-03-28 ENCOUNTER — Ambulatory Visit (INDEPENDENT_AMBULATORY_CARE_PROVIDER_SITE_OTHER): Payer: Medicare Other | Admitting: Family

## 2013-03-28 ENCOUNTER — Encounter: Payer: Self-pay | Admitting: Family

## 2013-03-28 VITALS — BP 160/70 | HR 73 | Wt 180.0 lb

## 2013-03-28 DIAGNOSIS — R739 Hyperglycemia, unspecified: Secondary | ICD-10-CM

## 2013-03-28 DIAGNOSIS — R7309 Other abnormal glucose: Secondary | ICD-10-CM

## 2013-03-28 DIAGNOSIS — R634 Abnormal weight loss: Secondary | ICD-10-CM

## 2013-03-28 DIAGNOSIS — Z23 Encounter for immunization: Secondary | ICD-10-CM

## 2013-03-28 DIAGNOSIS — Z860100 Personal history of colon polyps, unspecified: Secondary | ICD-10-CM

## 2013-03-28 LAB — CBC WITH DIFFERENTIAL/PLATELET
Basophils Relative: 0.6 % (ref 0.0–3.0)
Eosinophils Relative: 2.9 % (ref 0.0–5.0)
HCT: 36.2 % — ABNORMAL LOW (ref 39.0–52.0)
MCV: 89.8 fl (ref 78.0–100.0)
Monocytes Absolute: 0.8 10*3/uL (ref 0.1–1.0)
Neutrophils Relative %: 66.3 % (ref 43.0–77.0)
RBC: 4.04 Mil/uL — ABNORMAL LOW (ref 4.22–5.81)
WBC: 11.8 10*3/uL — ABNORMAL HIGH (ref 4.5–10.5)

## 2013-03-28 LAB — HEPATIC FUNCTION PANEL
ALT: 23 U/L (ref 0–53)
AST: 21 U/L (ref 0–37)
Alkaline Phosphatase: 40 U/L (ref 39–117)
Bilirubin, Direct: 0 mg/dL (ref 0.0–0.3)
Total Protein: 7.6 g/dL (ref 6.0–8.3)

## 2013-03-28 NOTE — Patient Instructions (Signed)
  1. Refer for colonoscopy screen

## 2013-03-28 NOTE — Progress Notes (Signed)
Subjective:    Patient ID: Kevin [name], male    DOB: [birth year], 72 y.o.   MRN: [id number]  HPI  72 year old white male, former smoker, patient of Dr. Arnoldo Morale in today with concerns of weight loss. Patient is down approximately 18 pounds since January. Reports his appetite is decreased however, reports eating well. He does not exercise. Patient reports he smoked in his 6s for several years. Denies any blood in her stool, dark black stools. Had a chest x-ray and June 2013, CT scan June 2013. Last colonoscopy 2007 for which he had polyps removed. Blood glucose last BMP was 129. Reports eating a lot of sweets.  Review of Systems  Constitutional: Positive for unexpected weight change.  Respiratory: Negative.   Cardiovascular: Negative.   Gastrointestinal: Negative.   Endocrine: Negative.   Genitourinary: Negative.   Musculoskeletal: Negative.   Skin: Negative.   Neurological: Negative.   Hematological: Negative.   Psychiatric/Behavioral: Negative.    Past Medical History  Diagnosis Date  . Hyperlipidemia   . Hypertension   . Stroke 2013    Mini stroke  . Arthritis   . Myocardial infarction June 2013     X 2 mini stroke  . Peripheral vascular disease   . Wears glasses   . Full dentures   . Wears hearing aid     both ears    History   Social History  . Marital Status: Married    Spouse Name: N/A    Number of Children: N/A  . Years of Education: N/A   Occupational History  . Not on file.   Social History Main Topics  . Smoking status: Former Smoker    Quit date: 05/30/1990  . Smokeless tobacco: Never Used     Comment: last smoked 02-03-13  . Alcohol Use: No  . Drug Use: Yes    Special: Marijuana     Comment: nightly  . Sexual Activity: Yes   Other Topics Concern  . Not on file   Social History Narrative  . No narrative on file    Past Surgical History  Procedure Laterality Date  . Testicular cyst    . Abdominal aortic aneurysm repair  2010  . Abdominal  aortic aneurysm repair      2010  . Artery repair      neck appx 5 yrs ago  . Carotid endarterectomy  09/20/2004    Left  CEA  . Tonsillectomy    . Colonoscopy    . Upper gi endoscopy    . Inguinal hernia repair Left 02/05/2013    Procedure: HERNIA REPAIR INGUINAL ADULT;  Surgeon: Joyice Faster. Cornett, MD;  Location: [facility];  Service: General;  Laterality: Left;  . Insertion of mesh Left 02/05/2013    Procedure: INSERTION OF MESH;  Surgeon: Joyice Faster. Cornett, MD;  Location: [facility];  Service: General;  Laterality: Left;    Family History  Problem Relation Age of Onset  . Parkinsonism Father   . Dementia Father   . Colon cancer Mother   . Cancer Mother     No Known Allergies  Current Outpatient Prescriptions on File Prior to Visit  Medication Sig Dispense Refill  . amLODipine (NORVASC) 5 MG tablet Take 1 tablet (5 mg total) by mouth daily.  90 tablet  3  . Ascorbic Acid (VITAMIN C) 500 MG CHEW Chew by mouth daily.        Marland Kitchen atorvastatin (LIPITOR) 20 MG tablet Take 20 mg  by mouth daily.      . bisoprolol-hydrochlorothiazide (ZIAC) 10-6.25 MG per tablet Take 1 tablet by mouth daily.      . butalbital-acetaminophen-caffeine (ESGIC) 50-325-40 MG per tablet Take 1 tablet by mouth 2 (two) times daily as needed for headache.  30 tablet  2  . clopidogrel (PLAVIX) 75 MG tablet Take 1 tablet (75 mg total) by mouth daily.  90 tablet  3  . diclofenac (VOLTAREN) 75 MG EC tablet 75 mg 2 (two) times daily. TAKE ONE TABLET BY MOUTH TWICE DAILY AS NEEDED      . fenofibrate (TRICOR) 145 MG tablet Take 1 tablet (145 mg total) by mouth daily.  30 tablet  11  . Melatonin 3 MG TABS Take 1 tablet by mouth at bedtime.        . OMEGA 3 1000 MG CAPS Take 2 capsules by mouth daily.        . Saw Palmetto, Serenoa repens, 1000 MG CAPS Take by mouth daily.        . Tamsulosin HCl (FLOMAX) 0.4 MG CAPS Take 1 capsule (0.4 mg total) by mouth daily after supper.  90 capsule  3  .  tiZANidine (ZANAFLEX) 4 MG tablet TAKE ONE TABLET BY MOUTH EVERY 6 HOURS AS NEEDED  30 tablet  0  . tiZANidine (ZANAFLEX) 4 MG tablet TAKE ONE TABLET BY MOUTH EVERY 6 HOURS AS NEEDED  30 tablet  3  . traMADol (ULTRAM) 50 MG tablet Take 1 tablet (50 mg total) by mouth every 6 (six) hours as needed for pain. TAKE ONE TABLET BY MOUTH EVERY 8 HOURS AS NEEDED FOR PAIN  90 tablet  5  . HYDROcodone-acetaminophen (NORCO) 5-325 MG per tablet Take 1 tablet by mouth every 6 (six) hours as needed for pain.  30 tablet  0  . oxyCODONE-acetaminophen (ROXICET) 5-325 MG per tablet Take 1 tablet by mouth every 4 (four) hours as needed for pain.  30 tablet  0   No current facility-administered medications on file prior to visit.    BP 160/70  Pulse 73  Wt 180 lb (81.647 kg)  BMI 26.57 kg/m2chart    Objective:   Physical Exam  Constitutional: He is oriented to person, place, and time. He appears well-developed and well-nourished.  HENT:  Right Ear: External ear normal.  Left Ear: External ear normal.  Nose: Nose normal.  Mouth/Throat: Oropharynx is clear and moist.  Neck: Normal range of motion. Neck supple. No thyromegaly present.  Cardiovascular: Normal rate, regular rhythm and normal heart sounds.   Pulmonary/Chest: Effort normal and breath sounds normal.  Abdominal: Soft. Bowel sounds are normal. There is no rebound and no guarding.  Musculoskeletal: Normal range of motion.  Neurological: He is alert and oriented to person, place, and time.  Skin: Skin is warm and dry.  Psychiatric: He has a normal mood and affect.          Assessment & Plan:  Assessment: 1. Weight loss 2. Hyperglycemia B. Hypertension  Plan: Refer for colonoscopy screening. Labs sent to include A1c, BMP and LFTs.  Thyroid was normal in July. We'll followup with patient pending labs and colonoscopy screening for further management. Consider Remeron if symptoms persist but no etiology.

## 2013-04-04 ENCOUNTER — Other Ambulatory Visit: Payer: Self-pay

## 2013-04-15 ENCOUNTER — Encounter: Payer: Self-pay | Admitting: Gastroenterology

## 2013-04-24 ENCOUNTER — Encounter: Payer: Self-pay | Admitting: Internal Medicine

## 2013-05-07 ENCOUNTER — Other Ambulatory Visit: Payer: Self-pay | Admitting: *Deleted

## 2013-05-07 MED ORDER — TAMSULOSIN HCL 0.4 MG PO CAPS: 0.4000 mg | ORAL_CAPSULE | Freq: Every day | ORAL | Status: DC

## 2013-05-15 ENCOUNTER — Telehealth: Payer: Self-pay | Admitting: *Deleted

## 2013-05-15 ENCOUNTER — Ambulatory Visit (INDEPENDENT_AMBULATORY_CARE_PROVIDER_SITE_OTHER): Payer: Medicare Other | Admitting: Gastroenterology

## 2013-05-15 ENCOUNTER — Other Ambulatory Visit (INDEPENDENT_AMBULATORY_CARE_PROVIDER_SITE_OTHER): Payer: Medicare Other

## 2013-05-15 ENCOUNTER — Encounter: Payer: Self-pay | Admitting: Gastroenterology

## 2013-05-15 VITALS — BP 150/80 | HR 60 | Ht 69.0 in | Wt 186.4 lb

## 2013-05-15 DIAGNOSIS — R634 Abnormal weight loss: Secondary | ICD-10-CM

## 2013-05-15 DIAGNOSIS — Z860100 Personal history of colon polyps, unspecified: Secondary | ICD-10-CM | POA: Insufficient documentation

## 2013-05-15 DIAGNOSIS — Z800 Family history of malignant neoplasm of digestive organs: Secondary | ICD-10-CM

## 2013-05-15 LAB — COMPREHENSIVE METABOLIC PANEL
ALT: 24 U/L (ref 0–53)
Alkaline Phosphatase: 36 U/L — ABNORMAL LOW (ref 39–117)
Sodium: 138 mEq/L (ref 135–145)
Total Bilirubin: 0.4 mg/dL (ref 0.3–1.2)
Total Protein: 6.8 g/dL (ref 6.0–8.3)

## 2013-05-15 LAB — CBC WITH DIFFERENTIAL/PLATELET
Basophils Absolute: 0.1 10*3/uL (ref 0.0–0.1)
Eosinophils Absolute: 0.3 10*3/uL (ref 0.0–0.7)
Lymphocytes Relative: 26 % (ref 12.0–46.0)
MCHC: 34.5 g/dL (ref 30.0–36.0)
Neutrophils Relative %: 61.1 % (ref 43.0–77.0)
Platelets: 172 10*3/uL (ref 150.0–400.0)
RBC: 3.39 Mil/uL — ABNORMAL LOW (ref 4.22–5.81)
RDW: 14.3 % (ref 11.5–14.6)

## 2013-05-15 LAB — TSH: TSH: 1.41 u[IU]/mL (ref 0.35–5.50)

## 2013-05-15 MED ORDER — NA SULFATE-K SULFATE-MG SULF 17.5-3.13-1.6 GM/177ML PO SOLN: 1.0000 | Freq: Once | ORAL | Status: DC

## 2013-05-15 NOTE — Telephone Encounter (Signed)
  05/15/2013   RE: Kevin [name] DOB: [birth year] MRN: [id number]   Dear  Dr Arnoldo Morale,    We have scheduled the above patient for an endoscopic procedure. Our records show that he is on anticoagulation therapy.   Please advise as to how long the patient may come off his therapy of Plavix prior to the procedure, which is scheduled for 06/20/2013.  Please fax back/ or route the completed form to [facility] at [phone].   Sincerely,    Genella Mech

## 2013-05-15 NOTE — Patient Instructions (Signed)
You have been scheduled for a colonoscopy with propofol. Please follow written instructions given to you at your visit today.  Please pick up your prep kit at the pharmacy within the next 1-3 days. If you use inhalers (even only as needed), please bring them with you on the day of your procedure. Your physician has requested that you go to www.startemmi.com and enter the access code given to you at your visit today. This web site gives a general overview about your procedure. However, you should still follow specific instructions given to you by our office regarding your preparation for the procedure.  You will be contacted by our office prior to your procedure for directions on holding your Plavix.  If you do not hear from our office 1 week prior to your scheduled procedure, please call [phone] to discuss.   

## 2013-05-15 NOTE — Assessment & Plan Note (Signed)
Plan followup colonoscopy 

## 2013-05-15 NOTE — Progress Notes (Signed)
_                                                                                                                History of Present Illness: 72 year old white male with history of colon polyps, CVA, MI, on Plavix, referred for evaluation of weight loss and change in bowel habits.  Colonoscopy in 2000 and 2007 demonstrated small adenomatous polyps.  Over the past 6-8 months he's lost about 45 pounds.  He feels well.  Appetite is slightly decreased.  Weight loss is coincidental with retirement.  He has noticed a slight change in bowel habits.  Compared to daily bowel movements, he now may go every 2-3 days.  There is no history of melena or hematochezia.  He denies abdominal pain.  He thinks he takes an NSAID regularly.    Past Medical History  Diagnosis Date  . Hyperlipidemia   . Hypertension   . Stroke 2013    Mini stroke  . Arthritis   . Myocardial infarction June 2013     X 2 mini stroke  . Peripheral vascular disease   . Wears glasses   . Full dentures   . Wears hearing aid     both ears   Past Surgical History  Procedure Laterality Date  . Testicular cyst    . Abdominal aortic aneurysm repair  2010  . Abdominal aortic aneurysm repair      2010  . Artery repair      neck appx 5 yrs ago  . Carotid endarterectomy  09/20/2004    Left  CEA  . Tonsillectomy    . Colonoscopy    . Upper gi endoscopy    . Inguinal hernia repair Left 02/05/2013    Procedure: HERNIA REPAIR INGUINAL ADULT;  Surgeon: Joyice Faster. Cornett, MD;  Location: [facility];  Service: General;  Laterality: Left;  . Insertion of mesh Left 02/05/2013    Procedure: INSERTION OF MESH;  Surgeon: Joyice Faster. Cornett, MD;  Location: [facility];  Service: General;  Laterality: Left;   family history includes Cancer in his mother; Colon cancer in his mother; Dementia in his father; Parkinsonism in his father. Current Outpatient Prescriptions  Medication Sig Dispense Refill  .  amLODipine (NORVASC) 5 MG tablet Take 1 tablet (5 mg total) by mouth daily.  90 tablet  3  . Ascorbic Acid (VITAMIN C) 500 MG CHEW Chew by mouth daily.        Marland Kitchen atorvastatin (LIPITOR) 20 MG tablet Take 20 mg by mouth daily.      . bisoprolol-hydrochlorothiazide (ZIAC) 10-6.25 MG per tablet Take 1 tablet by mouth daily.      . clopidogrel (PLAVIX) 75 MG tablet Take 1 tablet (75 mg total) by mouth daily.  90 tablet  3  . diclofenac (VOLTAREN) 75 MG EC tablet 75 mg 2 (two) times daily. TAKE ONE TABLET BY MOUTH TWICE DAILY AS NEEDED      . fenofibrate (TRICOR) 145 MG tablet Take 1  tablet (145 mg total) by mouth daily.  30 tablet  11  . Melatonin 3 MG TABS Take 1 tablet by mouth at bedtime.        . OMEGA 3 1000 MG CAPS Take 2 capsules by mouth daily.        . Saw Palmetto, Serenoa repens, 1000 MG CAPS Take by mouth daily.        . tamsulosin (FLOMAX) 0.4 MG CAPS capsule Take 1 capsule (0.4 mg total) by mouth daily.  90 capsule  3  . traMADol (ULTRAM) 50 MG tablet Take 1 tablet (50 mg total) by mouth every 6 (six) hours as needed for pain. TAKE ONE TABLET BY MOUTH EVERY 8 HOURS AS NEEDED FOR PAIN  90 tablet  5   No current facility-administered medications for this visit.   Allergies as of 05/15/2013  . (No Known Allergies)    reports that he quit smoking about 22 years ago. He has never used smokeless tobacco. He reports that he uses illicit drugs (Marijuana). He reports that he does not drink alcohol.     Review of Systems: Pertinent positive and negative review of systems were noted in the above HPI section. All other review of systems were otherwise negative.  Vital signs were reviewed in today's medical record Physical Exam: General: Well developed , well nourished, no acute distress Skin: anicteric Head: Normocephalic and atraumatic Eyes:  sclerae anicteric, EOMI Ears: Normal auditory acuity Mouth: No deformity or lesions Neck: Supple, no masses or thyromegaly Lungs: Clear  throughout to auscultation Heart: Regular rate and rhythm; no murmurs, rubs or bruits Abdomen: Soft, non tender and non distended. No masses, hepatosplenomegaly or hernias noted. Normal Bowel sounds Rectal:deferred Musculoskeletal: Symmetrical with no gross deformities  Skin: No lesions on visible extremities Pulses:  Normal pulses noted Extremities: No clubbing, cyanosis, edema or deformities noted Neurological: Alert oriented x 4, grossly nonfocal Cervical Nodes:  No significant cervical adenopathy Inguinal Nodes: No significant inguinal adenopathy Psychological:  Alert and cooperative. Normal mood and affect  See Assessment and Plan under Problem List

## 2013-05-15 NOTE — Assessment & Plan Note (Addendum)
Etiology for weight loss is unclear.  The patient feels well.  There is nothing notable on previous lab work and physical exam is remarkable.  Hyperthyroidism should be ruled out although clinically he is not hyperthyroid.  Occult malignancy is a consideration.  His only symptom is a slight change in bowel habits.  Recommendations #1 check CBC, comprehensive metabolic profile, TSH and T4 #2 colonoscopy.  I will check with the patient's primary care physician whether Plavix can be held.  The risk of holding anticoagulation therapy or antiplatelet medications was discussed including the increased risk for thromboembolic disease that may include DVT, pulmonary emboli and stroke. The patient understands this risk and is willing to proceed with temporally holding the medication provided that this is approved by her PCP or cardiologist.

## 2013-05-16 LAB — T4: T4, Total: 7.9 ug/dL (ref 5.0–12.5)

## 2013-05-17 NOTE — Telephone Encounter (Signed)
Patient may hold the plavix for up to 7 days prior to surgery

## 2013-05-20 NOTE — Telephone Encounter (Signed)
Called pt to inform ok to hold plavix  Left message

## 2013-05-21 ENCOUNTER — Encounter: Payer: Self-pay | Admitting: Gastroenterology

## 2013-06-20 ENCOUNTER — Ambulatory Visit (AMBULATORY_SURGERY_CENTER): Payer: Medicare Other | Admitting: Gastroenterology

## 2013-06-20 ENCOUNTER — Encounter: Payer: Self-pay | Admitting: Gastroenterology

## 2013-06-20 VITALS — BP 149/68 | HR 55 | Temp 99.1°F | Resp 15 | Ht 69.0 in | Wt 186.0 lb

## 2013-06-20 DIAGNOSIS — Z860100 Personal history of colon polyps, unspecified: Secondary | ICD-10-CM

## 2013-06-20 DIAGNOSIS — K5730 Diverticulosis of large intestine without perforation or abscess without bleeding: Secondary | ICD-10-CM

## 2013-06-20 DIAGNOSIS — Z800 Family history of malignant neoplasm of digestive organs: Secondary | ICD-10-CM

## 2013-06-20 MED ORDER — SODIUM CHLORIDE 0.9 % IV SOLN: 500.0000 mL | INTRAVENOUS | Status: DC

## 2013-06-20 NOTE — Patient Instructions (Addendum)
YOU HAD AN ENDOSCOPIC PROCEDURE TODAY AT [facility]: Refer to the procedure report that was given to you for any specific questions about what was found during the examination.  If the procedure report does not answer your questions, please call your gastroenterologist to clarify.  If you requested that your care partner not be given the details of your procedure findings, then the procedure report has been included in a sealed envelope for you to review at your convenience later.  YOU SHOULD EXPECT: Some feelings of bloating in the abdomen. Passage of more gas than usual.  Walking can help get rid of the air that was put into your GI tract during the procedure and reduce the bloating. If you had a lower endoscopy (such as a colonoscopy or flexible sigmoidoscopy) you may notice spotting of blood in your stool or on the toilet paper. If you underwent a bowel prep for your procedure, then you may not have a normal bowel movement for a few days.  DIET: Your first meal following the procedure should be a light meal and then it is ok to progress to your normal diet.  A half-sandwich or bowl of soup is an example of a good first meal.  Heavy or fried foods are harder to digest and may make you feel nauseous or bloated.  Likewise meals heavy in dairy and vegetables can cause extra gas to form and this can also increase the bloating.  Drink plenty of fluids but you should avoid alcoholic beverages for 24 hours.  ACTIVITY: Your care partner should take you home directly after the procedure.  You should plan to take it easy, moving slowly for the rest of the day.  You can resume normal activity the day after the procedure however you should NOT DRIVE or use heavy machinery for 24 hours (because of the sedation medicines used during the test).    SYMPTOMS TO REPORT IMMEDIATELY: A gastroenterologist can be reached at any hour.  During normal business hours, 8:30 AM to 5:00 PM Monday through Friday,  call [phone].  After hours and on weekends, please call the GI answering service at [phone] who will take a message and have the physician on call contact you.   Following lower endoscopy (colonoscopy or flexible sigmoidoscopy):  Excessive amounts of blood in the stool  Significant tenderness or worsening of abdominal pains  Swelling of the abdomen that is new, acute  Fever of 100F or higher     FOLLOW UP: If any biopsies were taken you will be contacted by phone or by letter within the next 1-3 weeks.  Call your gastroenterologist if you have not heard about the biopsies in 3 weeks.  Our staff will call the home number listed on your records the next business day following your procedure to check on you and address any questions or concerns that you may have at that time regarding the information given to you following your procedure. This is a courtesy call and so if there is no answer at the home number and we have not heard from you through the emergency physician on call, we will assume that you have returned to your regular daily activities without incident.  SIGNATURES/CONFIDENTIALITY: You and/or your care partner have signed paperwork which will be entered into your electronic medical record.  These signatures attest to the fact that that the information above on your After Visit Summary has been reviewed and is understood.  Full responsibility of the  confidentiality of this discharge information lies with you and/or your care-partner.  Diverticulosis and high fiber information given.  Colonoscopy in 5 years (2020) pending overall medical evaluation.  Resume plavix today.

## 2013-06-20 NOTE — Op Note (Signed)
[facility]  [address]. [place], [phone]   COLONOSCOPY PROCEDURE REPORT  PATIENT: Kevin [name], Kevin [name]  MR#: [id number] BIRTHDATE: 11/03/1940 , 72  yrs. old GENDER: Male ENDOSCOPIST: Inda Castle, MD REFERRED BY: PROCEDURE DATE:  06/20/2013 PROCEDURE:   Colonoscopy, diagnostic First Screening Colonoscopy - Avg.  risk and is 50 yrs.  old or older - No.  Prior Negative Screening - Now for repeat screening. N/A  History of Adenoma - Now for follow-up colonoscopy & has been > or = to 3 yrs.  Yes hx of adenoma.  Has been 3 or more years since last colonoscopy.  Polyps Removed Today? No.  Recommend repeat exam, <10 yrs? Yes.  High risk (family or personal hx). ASA CLASS:   Class III INDICATIONS:Patient's immediate family history of colon cancer and Patient's personal history of colon polyps 2000, 2007. MEDICATIONS: MAC sedation, administered by CRNA and propofol (Diprivan) 300mg  IV  DESCRIPTION OF PROCEDURE:   After the risks benefits and alternatives of the procedure were thoroughly explained, informed consent was obtained.  A digital rectal exam revealed no abnormalities of the rectum.   The LB [id number]  endoscope was introduced through the anus and advanced to the cecum, which was identified by both the appendix and ileocecal valve. No adverse events experienced.   The quality of the prep was excellent using Suprep  The instrument was then slowly withdrawn as the colon was fully examined.      COLON FINDINGS: Mild diverticulosis was noted in the sigmoid colon. The colon was otherwise normal.  There was no diverticulosis, inflammation, polyps or cancers unless previously stated. Retroflexed views revealed no abnormalities. The time to cecum=6 minutes 21 seconds.  Withdrawal time=7 minutes 45 seconds.  The scope was withdrawn and the procedure completed. COMPLICATIONS: There were no complications.  ENDOSCOPIC IMPRESSION: 1.   Mild diverticulosis  was noted in the sigmoid colon 2.   The colon was otherwise normal  RECOMMENDATIONS: Colonoscopy 5 years pending overall medical evaluation Resume plavix today   eSigned:  Inda Castle, MD 06/20/2013 4:17 PM   cc: Ricard Dillon, MD and Rolan Lipa, MD   PATIENT NAME:  Kevin [name], Kevin [name] MR#: [id number]

## 2013-06-20 NOTE — Progress Notes (Signed)
A/ox3 pleased with MAC, report to Jane RN 

## 2013-06-21 ENCOUNTER — Telehealth: Payer: Self-pay | Admitting: *Deleted

## 2013-06-21 NOTE — Telephone Encounter (Signed)
  Follow up Call-  Call back number 06/20/2013  Post procedure Call Back phone  # [phone]  Permission to leave phone message Yes     Patient questions:  Do you have a fever, pain , or abdominal swelling? no Pain Score  0 *  Have you tolerated food without any problems? no  Have you been able to return to your normal activities? yes  Do you have any questions about your discharge instructions: Diet   no Medications  no Follow up visit  no  Do you have questions or concerns about your Care? no  Actions: * If pain score is 4 or above: No action needed, pain <4. Patient is able to tolerate foods, unable to correct to yes on above question.

## 2013-06-24 ENCOUNTER — Encounter: Payer: Self-pay | Admitting: Internal Medicine

## 2013-06-24 ENCOUNTER — Ambulatory Visit (INDEPENDENT_AMBULATORY_CARE_PROVIDER_SITE_OTHER): Payer: Medicare Other | Admitting: Internal Medicine

## 2013-06-24 VITALS — BP 170/72 | HR 76 | Temp 98.3°F | Resp 16 | Ht 69.0 in | Wt 187.0 lb

## 2013-06-24 DIAGNOSIS — I10 Essential (primary) hypertension: Secondary | ICD-10-CM

## 2013-06-24 DIAGNOSIS — M545 Low back pain, unspecified: Secondary | ICD-10-CM

## 2013-06-24 DIAGNOSIS — IMO0002 Reserved for concepts with insufficient information to code with codable children: Secondary | ICD-10-CM

## 2013-06-24 DIAGNOSIS — G8929 Other chronic pain: Secondary | ICD-10-CM

## 2013-06-24 DIAGNOSIS — I6529 Occlusion and stenosis of unspecified carotid artery: Secondary | ICD-10-CM

## 2013-06-24 MED ORDER — TAPENTADOL HCL ER 100 MG PO TB12: 1.0000 | ORAL_TABLET | Freq: Two times a day (BID) | ORAL | Status: DC

## 2013-06-24 NOTE — Addendum Note (Signed)
Addended by: Ricard Dillon on: 06/24/2013 11:12 AM   Modules accepted: Orders, Medications

## 2013-06-24 NOTE — Progress Notes (Signed)
Pre visit review using our clinic review tool, if applicable. No additional management support is needed unless otherwise documented below in the visit note. 

## 2013-06-24 NOTE — Patient Instructions (Addendum)
Stop the tramadol and start the  New medication  Stop advil and diclofenac

## 2013-06-24 NOTE — Progress Notes (Signed)
Subjective:    Patient ID: Kevin [name], male    DOB: Nov 02, 1940, 73 y.o.   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  His blood pressure is markedly elevated today but this is secondary to pain a recheck of his blood pressure after he had been sitting and pain was decreased was down to 158/72.  I believe that rather than increasing his blood pressure medications pain control is the optimal way of obtaining good blood pressure control.  He rates his pain as 9-10 out of 10    Review of Systems  Constitutional: Negative for fever and fatigue.  HENT: Negative for congestion, hearing loss and postnasal drip.   Eyes: Negative for discharge, redness and visual disturbance.  Respiratory: Negative for cough, shortness of breath and wheezing.   Cardiovascular: Negative for leg swelling.  Gastrointestinal: Negative for abdominal pain, constipation and abdominal distention.  Genitourinary: Negative for urgency and frequency.  Musculoskeletal: Positive for arthralgias, back pain, joint swelling, myalgias and neck pain.  Skin: Negative for color change and rash.  Neurological: Negative for weakness and light-headedness.  Hematological: Negative for adenopathy.  Psychiatric/Behavioral: Negative for behavioral problems.   Past Medical History  Diagnosis Date  . Hyperlipidemia   . Hypertension   . Stroke 2013    Mini stroke  . Arthritis   . Myocardial infarction June 2013     X 2 mini stroke  . Peripheral vascular disease   . Wears glasses   . Full dentures   . Wears hearing aid     both ears    History   Social History  . Marital Status: Married    Spouse Name: N/A    Number of Children: N/A  . Years of Education: N/A   Occupational History  . Not on file.   Social History Main Topics  . Smoking status: Former Smoker    Quit  date: 05/30/1990  . Smokeless tobacco: Never Used     Comment: last smoked 02-03-13  . Alcohol Use: No     Comment: has not done marijuana in a while, but may start back if pain in back worsens  . Drug Use: Yes    Special: Marijuana     Comment: nightly  . Sexual Activity: Yes   Other Topics Concern  . Not on file   Social History Narrative  . No narrative on file    Past Surgical History  Procedure Laterality Date  . Testicular cyst    . Abdominal aortic aneurysm repair  2010  . Abdominal aortic aneurysm repair      2010  . Artery repair      neck appx 5 yrs ago  . Carotid endarterectomy  09/20/2004    Left  CEA  . Tonsillectomy    . Colonoscopy    . Upper gi endoscopy    . Inguinal hernia repair Left 02/05/2013    Procedure: HERNIA REPAIR INGUINAL ADULT;  Surgeon: Joyice Faster. Cornett, MD;  Location: [facility];  Service: General;  Laterality: Left;  . Insertion of mesh Left 02/05/2013    Procedure: INSERTION OF MESH;  Surgeon: Joyice Faster. Cornett, MD;  Location: [facility];  Service: General;  Laterality: Left;  . Testiclar cyst excision      Family History  Problem Relation Age of Onset  .  Parkinsonism Father   . Dementia Father   . Colon cancer Mother   . Cancer Mother     No Known Allergies  Current Outpatient Prescriptions on File Prior to Visit  Medication Sig Dispense Refill  . amLODipine (NORVASC) 5 MG tablet Take 1 tablet (5 mg total) by mouth daily.  90 tablet  3  . Ascorbic Acid (VITAMIN C) 500 MG CHEW Chew by mouth daily.        Marland Kitchen atorvastatin (LIPITOR) 20 MG tablet Take 20 mg by mouth daily.      . bisoprolol-hydrochlorothiazide (ZIAC) 10-6.25 MG per tablet Take 1 tablet by mouth daily.      . clopidogrel (PLAVIX) 75 MG tablet Take 1 tablet (75 mg total) by mouth daily.  90 tablet  3  . diclofenac (VOLTAREN) 75 MG EC tablet 75 mg 2 (two) times daily. TAKE ONE TABLET BY MOUTH TWICE DAILY AS NEEDED      . fenofibrate (TRICOR) 145  MG tablet Take 1 tablet (145 mg total) by mouth daily.  30 tablet  11  . Ibuprofen-Diphenhydramine Cit (ADVIL PM PO) Take by mouth.      . Melatonin 3 MG TABS Take 1 tablet by mouth at bedtime.        . OMEGA 3 1000 MG CAPS Take 2 capsules by mouth daily.        . Saw Palmetto, Serenoa repens, 1000 MG CAPS Take by mouth daily.        . tamsulosin (FLOMAX) 0.4 MG CAPS capsule Take 1 capsule (0.4 mg total) by mouth daily.  90 capsule  3  . tiZANidine (ZANAFLEX) 4 MG tablet        No current facility-administered medications on file prior to visit.    BP 170/72  Pulse 76  Temp(Src) 98.3 F (36.8 C)  Resp 16  Ht 5\' 9"  (1.753 m)  Wt 187 lb (84.823 kg)  BMI 27.60 kg/m2       Objective:   Physical Exam  Constitutional: He appears well-developed and well-nourished.  HENT:  Head: Normocephalic and atraumatic.  Eyes: Conjunctivae are normal. Pupils are equal, round, and reactive to light.  Neck: Normal range of motion. Neck supple.  Cardiovascular: Normal rate and regular rhythm.   Murmur heard. Pulmonary/Chest: Effort normal and breath sounds normal.  Abdominal: Soft. Bowel sounds are normal.  Musculoskeletal: He exhibits edema and tenderness.          Assessment & Plan:  Blood pressure control is suboptimal secondary to pain.  Pain control is extremely suboptimal.  Trial of  nucynta 100 mg extended release twice daily for pain control discontinue the Ultram at this time  Failed epidurals May need neurosurgical evaluation id this fails and patient agrees

## 2013-07-03 ENCOUNTER — Telehealth: Payer: Self-pay | Admitting: Internal Medicine

## 2013-07-03 NOTE — Telephone Encounter (Signed)
Relevant patient education assigned to patient using Emmi. ° °

## 2013-07-18 ENCOUNTER — Other Ambulatory Visit: Payer: Self-pay | Admitting: Family

## 2013-07-18 DIAGNOSIS — I6529 Occlusion and stenosis of unspecified carotid artery: Secondary | ICD-10-CM

## 2013-07-18 DIAGNOSIS — Z48812 Encounter for surgical aftercare following surgery on the circulatory system: Secondary | ICD-10-CM

## 2013-07-18 DIAGNOSIS — I713 Abdominal aortic aneurysm, ruptured, unspecified: Secondary | ICD-10-CM

## 2013-07-27 ENCOUNTER — Other Ambulatory Visit: Payer: Self-pay | Admitting: Internal Medicine

## 2013-08-02 ENCOUNTER — Other Ambulatory Visit: Payer: Self-pay | Admitting: Internal Medicine

## 2013-09-01 ENCOUNTER — Other Ambulatory Visit: Payer: Self-pay | Admitting: Internal Medicine

## 2013-09-23 ENCOUNTER — Telehealth: Payer: Self-pay | Admitting: Internal Medicine

## 2013-09-23 DIAGNOSIS — M545 Low back pain, unspecified: Secondary | ICD-10-CM

## 2013-09-23 DIAGNOSIS — IMO0002 Reserved for concepts with insufficient information to code with codable children: Secondary | ICD-10-CM

## 2013-09-23 DIAGNOSIS — G8929 Other chronic pain: Secondary | ICD-10-CM

## 2013-09-23 MED ORDER — TAPENTADOL HCL ER 100 MG PO TB12: 1.0000 | ORAL_TABLET | Freq: Two times a day (BID) | ORAL | Status: DC

## 2013-09-23 NOTE — Telephone Encounter (Signed)
Rx ready for pick up and Left message on machine for patient   

## 2013-09-23 NOTE — Telephone Encounter (Signed)
Pt requesting refill of Tapentadol HCl (NUCYNTA ER) 100 MG TB12. Pt states Dr. Arnoldo Morale usually writes 3 rx's at a time.  Please call pt when ready for pick up.

## 2013-10-14 ENCOUNTER — Other Ambulatory Visit: Payer: Self-pay | Admitting: Internal Medicine

## 2013-11-12 ENCOUNTER — Encounter: Payer: Self-pay | Admitting: Family

## 2013-11-12 ENCOUNTER — Ambulatory Visit (INDEPENDENT_AMBULATORY_CARE_PROVIDER_SITE_OTHER): Payer: Medicare Other | Admitting: Family

## 2013-11-12 VITALS — BP 162/92 | HR 49 | Ht 69.0 in | Wt 191.0 lb

## 2013-11-12 DIAGNOSIS — M79604 Pain in right leg: Secondary | ICD-10-CM

## 2013-11-12 DIAGNOSIS — M545 Low back pain, unspecified: Secondary | ICD-10-CM

## 2013-11-12 DIAGNOSIS — M79646 Pain in unspecified finger(s): Secondary | ICD-10-CM

## 2013-11-12 DIAGNOSIS — M25559 Pain in unspecified hip: Secondary | ICD-10-CM

## 2013-11-12 DIAGNOSIS — M25551 Pain in right hip: Secondary | ICD-10-CM

## 2013-11-12 DIAGNOSIS — M79609 Pain in unspecified limb: Secondary | ICD-10-CM

## 2013-11-12 MED ORDER — GABAPENTIN 100 MG PO CAPS: 100.0000 mg | ORAL_CAPSULE | Freq: Three times a day (TID) | ORAL | Status: DC

## 2013-11-12 NOTE — Progress Notes (Signed)
Pre visit review using our clinic review tool, if applicable. No additional management support is needed unless otherwise documented below in the visit note. 

## 2013-11-12 NOTE — Patient Instructions (Signed)
Radicular Pain Radicular pain in either the arm or leg is usually from a bulging or herniated disk in the spine. A piece of the herniated disk may press against the nerves as the nerves exit the spine. This causes pain which is felt at the tips of the nerves down the arm or leg. Other causes of radicular pain may include:  Fractures.  Heart disease.  Cancer.  An abnormal and usually degenerative state of the nervous system or nerves (neuropathy). Diagnosis may require CT or MRI scanning to determine the primary cause.  Nerves that start at the neck (nerve roots) may cause radicular pain in the outer shoulder and arm. It can spread down to the thumb and fingers. The symptoms vary depending on which nerve root has been affected. In most cases radicular pain improves with conservative treatment. Neck problems may require physical therapy, a neck collar, or cervical traction. Treatment may take many weeks, and surgery may be considered if the symptoms do not improve.  Conservative treatment is also recommended for sciatica. Sciatica causes pain to radiate from the lower back or buttock area down the leg into the foot. Often there is a history of back problems. Most patients with sciatica are better after 2 to 4 weeks of rest and other supportive care. Short term bed rest can reduce the disk pressure considerably. Sitting, however, is not a good position since this increases the pressure on the disk. You should avoid bending, lifting, and all other activities which make the problem worse. Traction can be used in severe cases. Surgery is usually reserved for patients who do not improve within the first months of treatment. Only take over-the-counter or prescription medicines for pain, discomfort, or fever as directed by your caregiver. Narcotics and muscle relaxants may help by relieving more severe pain and spasm and by providing mild sedation. Cold or massage can give significant relief. Spinal manipulation  is not recommended. It can increase the degree of disc protrusion. Epidural steroid injections are often effective treatment for radicular pain. These injections deliver medicine to the spinal nerve in the space between the protective covering of the spinal cord and back bones (vertebrae). Your caregiver can give you more information about steroid injections. These injections are most effective when given within two weeks of the onset of pain.  You should see your caregiver for follow up care as recommended. A program for neck and back injury rehabilitation with stretching and strengthening exercises is an important part of management.  SEEK IMMEDIATE MEDICAL CARE IF:  You develop increased pain, weakness, or numbness in your arm or leg.  You develop difficulty with bladder or bowel control.  You develop abdominal pain. Document Released: 06/23/2004 Document Revised: 08/08/2011 Document Reviewed: 09/08/2008 [facility] Patient Information 2014 [place], [place].  Hypertension As your heart beats, it forces blood through your arteries. This force is your blood pressure. If the pressure is too high, it is called hypertension (HTN) or high blood pressure. HTN is dangerous because you may have it and not know it. High blood pressure may mean that your heart has to work harder to pump blood. Your arteries may be narrow or stiff. The extra work puts you at risk for heart disease, stroke, and other problems.  Blood pressure consists of two numbers, a higher number over a lower, 110/72, for example. It is stated as "110 over 72." The ideal is below 120 for the top number (systolic) and under 80 for the bottom (diastolic). Write down your  blood pressure today. You should pay close attention to your blood pressure if you have certain conditions such as:  Heart failure.  Prior heart attack.  Diabetes  Chronic kidney disease.  Prior stroke.  Multiple risk factors for heart disease. To see if you have HTN,  your blood pressure should be measured while you are seated with your arm held at the level of the heart. It should be measured at least twice. A one-time elevated blood pressure reading (especially in the Emergency Department) does not mean that you need treatment. There may be conditions in which the blood pressure is different between your right and left arms. It is important to see your caregiver soon for a recheck. Most people have essential hypertension which means that there is not a specific cause. This type of high blood pressure may be lowered by changing lifestyle factors such as:  Stress.  Smoking.  Lack of exercise.  Excessive weight.  Drug/tobacco/alcohol use.  Eating less salt. Most people do not have symptoms from high blood pressure until it has caused damage to the body. Effective treatment can often prevent, delay or reduce that damage. TREATMENT  When a cause has been identified, treatment for high blood pressure is directed at the cause. There are a large number of medications to treat HTN. These fall into several categories, and your caregiver will help you select the medicines that are best for you. Medications may have side effects. You should review side effects with your caregiver. If your blood pressure stays high after you have made lifestyle changes or started on medicines,   Your medication(s) may need to be changed.  Other problems may need to be addressed.  Be certain you understand your prescriptions, and know how and when to take your medicine.  Be sure to follow up with your caregiver within the time frame advised (usually within two weeks) to have your blood pressure rechecked and to review your medications.  If you are taking more than one medicine to lower your blood pressure, make sure you know how and at what times they should be taken. Taking two medicines at the same time can result in blood pressure that is too low. SEEK IMMEDIATE MEDICAL CARE  IF:  You develop a severe headache, blurred or changing vision, or confusion.  You have unusual weakness or numbness, or a faint feeling.  You have severe chest or abdominal pain, vomiting, or breathing problems. MAKE SURE YOU:   Understand these instructions.  Will watch your condition.  Will get help right away if you are not doing well or get worse. Document Released: 05/16/2005 Document Revised: 08/08/2011 Document Reviewed: 01/04/2008 [facility] Patient Information 2014 [place].

## 2013-11-12 NOTE — Progress Notes (Signed)
Subjective:    Patient ID: Kevin [name], male    DOB: 1941/02/19, 73 y.o.   MRN: [id number]  Hip Pain   Arthritis    73 year old nonsmoker Caucasian male presents with R hip pain, which has progressed over the past year to an aching he describes as 10/10 at times. He reports the pain radiates down into the posterior thigh and calf. 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   His PMH is complicated by hx of ruptured aortic aneurism, HTN, hyperlipidemia, and tremor.   He stopped taking bisoprolol-HCTZ approximately 2-3 months ago.   He specifically denies chest pain, dyspnea, paroxysmal nocturnal dyspnea, and claudication.   He also describes intermittent R PIP thumb pain for the last several months. The pain is localized to the joint and he reports some relief with Nucynta. He is left-handed.    Review of Systems  Constitutional: Negative.   HENT: Negative.   Eyes: Negative.   Respiratory: Negative.   Cardiovascular: Negative.   Gastrointestinal: Negative.   Endocrine: Negative.   Genitourinary: Negative.   Musculoskeletal: Positive for arthritis.  Skin: Negative.   Allergic/Immunologic: Negative.   Neurological: Negative.   Hematological: Negative.   Psychiatric/Behavioral: Negative.    Past Medical History  Diagnosis Date  . Hyperlipidemia   . Hypertension   . Stroke 2013    Mini stroke  . Arthritis   . Myocardial infarction June 2013     X 2 mini stroke  . Peripheral vascular disease   . Wears glasses   . Full dentures   . Wears hearing aid     both ears    History   Social History  . Marital Status: Married    Spouse Name: N/A    Number of Children: N/A  . Years of Education: N/A   Occupational History  . Not on file.   Social History Main Topics  . Smoking status: Former Smoker    Quit date: 05/30/1990  . Smokeless tobacco: Never Used     Comment: last smoked 02-03-13  . Alcohol Use: No     Comment: has not done  marijuana in a while, but may start back if pain in back worsens  . Drug Use: Yes    Special: Marijuana     Comment: nightly  . Sexual Activity: Yes   Other Topics Concern  . Not on file   Social History Narrative  . No narrative on file    Past Surgical History  Procedure Laterality Date  . Testicular cyst    . Abdominal aortic aneurysm repair  2010  . Abdominal aortic aneurysm repair      2010  . Artery repair      neck appx 5 yrs ago  . Carotid endarterectomy  09/20/2004    Left  CEA  . Tonsillectomy    . Colonoscopy    . Upper gi endoscopy    . Inguinal hernia repair Left 02/05/2013    Procedure: HERNIA REPAIR INGUINAL ADULT;  Surgeon: Joyice Faster. Cornett, MD;  Location: [facility];  Service: General;  Laterality: Left;  . Insertion of mesh Left 02/05/2013    Procedure: INSERTION OF MESH;  Surgeon: Joyice Faster. Cornett, MD;  Location: [facility];  Service: General;  Laterality: Left;  . Testiclar cyst excision      Family History  Problem Relation Age of Onset  . Parkinsonism Father   . Dementia Father   .  Colon cancer Mother   . Cancer Mother     No Known Allergies  Current Outpatient Prescriptions on File Prior to Visit  Medication Sig Dispense Refill  . amLODipine (NORVASC) 5 MG tablet Take 1 tablet (5 mg total) by mouth daily.  90 tablet  3  . Ascorbic Acid (VITAMIN C) 500 MG CHEW Chew by mouth daily.        Marland Kitchen atorvastatin (LIPITOR) 20 MG tablet Take 20 mg by mouth daily.      . clopidogrel (PLAVIX) 75 MG tablet Take 1 tablet (75 mg total) by mouth daily.  90 tablet  3  . fenofibrate (TRICOR) 145 MG tablet Take 1 tablet (145 mg total) by mouth daily.  30 tablet  11  . Melatonin 3 MG TABS Take 1 tablet by mouth at bedtime.        . OMEGA 3 1000 MG CAPS Take 2 capsules by mouth daily.        . Saw Palmetto, Serenoa repens, 1000 MG CAPS Take by mouth daily.        . tamsulosin (FLOMAX) 0.4 MG CAPS capsule Take 1 capsule (0.4 mg total) by  mouth daily.  90 capsule  3  . Tapentadol HCl (NUCYNTA ER) 100 MG TB12 Take 1 tablet by mouth 2 (two) times daily.  60 tablet  0  . tiZANidine (ZANAFLEX) 4 MG tablet TAKE ONE TABLET BY MOUTH EVERY 6 HOURS AS NEEDED  30 tablet  0  . tiZANidine (ZANAFLEX) 4 MG tablet TAKE ONE TABLET BY MOUTH EVERY 6 HOURS AS NEEDED  30 tablet  0  . bisoprolol-hydrochlorothiazide (ZIAC) 10-6.25 MG per tablet Take 1 tablet by mouth daily.       No current facility-administered medications on file prior to visit.    BP 162/92  Pulse 49  Ht 5\' 9"  (1.753 m)  Wt 191 lb (86.637 kg)  BMI 28.19 kg/m2  SpO2 99%    Objective:   Physical Exam  Constitutional: He is oriented to person, place, and time. He appears well-developed and well-nourished. No distress.  HENT:  Head: Normocephalic and atraumatic.  Eyes: EOM are normal. Pupils are equal, round, and reactive to light.  Neck: Normal range of motion. Neck supple.  Cardiovascular: Regular rhythm and normal heart sounds.  Exam reveals no gallop and no friction rub.   No murmur heard. No carotid, aortic or renal bruits heard   Pulmonary/Chest: Effort normal and breath sounds normal. No respiratory distress. He has no wheezes. He has no rales.  Abdominal: Soft. Bowel sounds are normal. He exhibits no mass.  Musculoskeletal: He exhibits no edema.  Negative pain at R hip with medial and lateral rotation of knee; negative SLR bilateral.   Neurological: He is alert and oriented to person, place, and time. He has normal reflexes.  Skin: Skin is warm and dry. He is not diaphoretic.  Psychiatric: He has a normal mood and affect. His behavior is normal. Judgment and thought content normal.      Assessment & Plan:  Olee was seen today for hip pain and arthritis.  Diagnoses and associated orders for this visit:  Right hip pain - gabapentin (NEURONTIN) 100 MG capsule; Take 1 capsule (100 mg total) by mouth 3 (three) times daily.  Low back pain radiating to right  leg - gabapentin (NEURONTIN) 100 MG capsule; Take 1 capsule (100 mg total) by mouth 3 (three) times daily.  Thumb pain

## 2013-11-26 ENCOUNTER — Ambulatory Visit (INDEPENDENT_AMBULATORY_CARE_PROVIDER_SITE_OTHER): Payer: Medicare Other | Admitting: Family

## 2013-11-26 ENCOUNTER — Encounter: Payer: Self-pay | Admitting: Family

## 2013-11-26 VITALS — BP 168/90 | HR 60 | Ht 69.0 in | Wt 191.0 lb

## 2013-11-26 DIAGNOSIS — M545 Low back pain, unspecified: Secondary | ICD-10-CM

## 2013-11-26 DIAGNOSIS — I10 Essential (primary) hypertension: Secondary | ICD-10-CM

## 2013-11-26 DIAGNOSIS — M25559 Pain in unspecified hip: Secondary | ICD-10-CM

## 2013-11-26 DIAGNOSIS — E7800 Pure hypercholesterolemia, unspecified: Secondary | ICD-10-CM

## 2013-11-26 DIAGNOSIS — M79604 Pain in right leg: Secondary | ICD-10-CM

## 2013-11-26 DIAGNOSIS — M25551 Pain in right hip: Secondary | ICD-10-CM

## 2013-11-26 MED ORDER — AMLODIPINE BESYLATE 10 MG PO TABS: 10.0000 mg | ORAL_TABLET | Freq: Every day | ORAL | Status: DC

## 2013-11-26 MED ORDER — GABAPENTIN 100 MG PO CAPS: 100.0000 mg | ORAL_CAPSULE | Freq: Three times a day (TID) | ORAL | Status: DC

## 2013-11-26 NOTE — Progress Notes (Signed)
Subjective:    Patient ID: Kevin [name], male    DOB: 12-Jun-1940, 73 y.o.   MRN: [id number]  HPI  73 year old male, nonsmoker, is  In today for a recheck of lumbar radiculopathy currently on Neurotin and tolerating it well. Pain is decreased. Blood pressure readings at home123-150/60-75. Tolerating adjustment medication well.   Review of Systems  Constitutional: Negative.   HENT: Negative.   Respiratory: Negative.   Cardiovascular: Negative.   Gastrointestinal: Negative.   Endocrine: Negative.   Genitourinary: Negative.   Musculoskeletal: Negative.  Negative for back pain.  Skin: Negative.   Allergic/Immunologic: Negative.   Neurological: Negative.   Psychiatric/Behavioral: Negative.    Past Medical History  Diagnosis Date  . Hyperlipidemia   . Hypertension   . Stroke 2013    Mini stroke  . Arthritis   . Myocardial infarction June 2013     X 2 mini stroke  . Peripheral vascular disease   . Wears glasses   . Full dentures   . Wears hearing aid     both ears    History   Social History  . Marital Status: Married    Spouse Name: N/A    Number of Children: N/A  . Years of Education: N/A   Occupational History  . Not on file.   Social History Main Topics  . Smoking status: Former Smoker    Quit date: 05/30/1990  . Smokeless tobacco: Never Used     Comment: last smoked 02-03-13  . Alcohol Use: No     Comment: has not done marijuana in a while, but may start back if pain in back worsens  . Drug Use: Yes    Special: Marijuana     Comment: nightly  . Sexual Activity: Yes   Other Topics Concern  . Not on file   Social History Narrative  . No narrative on file    Past Surgical History  Procedure Laterality Date  . Testicular cyst    . Abdominal aortic aneurysm repair  2010  . Abdominal aortic aneurysm repair      2010  . Artery repair      neck appx 5 yrs ago  . Carotid endarterectomy  09/20/2004    Left  CEA  . Tonsillectomy    . Colonoscopy    .  Upper gi endoscopy    . Inguinal hernia repair Left 02/05/2013    Procedure: HERNIA REPAIR INGUINAL ADULT;  Surgeon: Joyice Faster. Cornett, MD;  Location: [facility];  Service: General;  Laterality: Left;  . Insertion of mesh Left 02/05/2013    Procedure: INSERTION OF MESH;  Surgeon: Joyice Faster. Cornett, MD;  Location: [facility];  Service: General;  Laterality: Left;  . Testiclar cyst excision      Family History  Problem Relation Age of Onset  . Parkinsonism Father   . Dementia Father   . Colon cancer Mother   . Cancer Mother     No Known Allergies  Current Outpatient Prescriptions on File Prior to Visit  Medication Sig Dispense Refill  . Ascorbic Acid (VITAMIN C) 500 MG CHEW Chew by mouth daily.        Marland Kitchen atorvastatin (LIPITOR) 20 MG tablet Take 20 mg by mouth daily.      . bisoprolol-hydrochlorothiazide (ZIAC) 10-6.25 MG per tablet Take 1 tablet by mouth daily.      . clopidogrel (PLAVIX) 75 MG tablet Take 1 tablet (75 mg total) by mouth  daily.  90 tablet  3  . fenofibrate (TRICOR) 145 MG tablet Take 1 tablet (145 mg total) by mouth daily.  30 tablet  11  . Melatonin 3 MG TABS Take 1 tablet by mouth at bedtime.        . OMEGA 3 1000 MG CAPS Take 2 capsules by mouth daily.        . Saw Palmetto, Serenoa repens, 1000 MG CAPS Take by mouth daily.        . tamsulosin (FLOMAX) 0.4 MG CAPS capsule Take 1 capsule (0.4 mg total) by mouth daily.  90 capsule  3  . Tapentadol HCl (NUCYNTA ER) 100 MG TB12 Take 1 tablet by mouth 2 (two) times daily.  60 tablet  0  . tiZANidine (ZANAFLEX) 4 MG tablet TAKE ONE TABLET BY MOUTH EVERY 6 HOURS AS NEEDED  30 tablet  0  . tiZANidine (ZANAFLEX) 4 MG tablet TAKE ONE TABLET BY MOUTH EVERY 6 HOURS AS NEEDED  30 tablet  0   No current facility-administered medications on file prior to visit.    BP 168/90  Pulse 60  Ht 5\' 9"  (1.753 m)  Wt 191 lb (86.637 kg)  BMI 28.19 kg/m2chart    Objective:   Physical Exam  Constitutional:  He is oriented to person, place, and time. He appears well-developed and well-nourished.  HENT:  Right Ear: External ear normal.  Left Ear: External ear normal.  Nose: Nose normal.  Mouth/Throat: Oropharynx is clear and moist.  Neck: Normal range of motion. Neck supple.  Cardiovascular: Normal rate, regular rhythm and normal heart sounds.   168/82-BP  Pulmonary/Chest: Effort normal and breath sounds normal.  Abdominal: Soft. Bowel sounds are normal.  Musculoskeletal: Normal range of motion. He exhibits no edema.  Neurological: He is alert and oriented to person, place, and time.  Skin: Skin is warm and dry.  Psychiatric: He has a normal mood and affect.          Assessment & Plan:  Kevin [name] was seen today for follow-up.  Diagnoses and associated orders for this visit:  Unspecified essential hypertension  Pure hypercholesterolemia  Right hip pain - gabapentin (NEURONTIN) 100 MG capsule; Take 1 capsule (100 mg total) by mouth 3 (three) times daily.  Low back pain radiating to right leg - gabapentin (NEURONTIN) 100 MG capsule; Take 1 capsule (100 mg total) by mouth 3 (three) times daily.  Other Orders - amLODipine (NORVASC) 10 MG tablet; Take 1 tablet (10 mg total) by mouth daily.     Increase amlodipine to 10 mg daily. Watch for swelling. Recheck in 3-4 weeks and sooner as needed.

## 2013-11-26 NOTE — Progress Notes (Signed)
Pre visit review using our clinic review tool, if applicable. No additional management support is needed unless otherwise documented below in the visit note. 

## 2013-11-26 NOTE — Patient Instructions (Signed)

## 2014-01-08 ENCOUNTER — Other Ambulatory Visit: Payer: Self-pay | Admitting: Internal Medicine

## 2014-01-10 ENCOUNTER — Telehealth: Payer: Self-pay | Admitting: Internal Medicine

## 2014-01-10 ENCOUNTER — Other Ambulatory Visit: Payer: Self-pay | Admitting: Internal Medicine

## 2014-01-10 NOTE — Telephone Encounter (Signed)
Pt request refill of the following:   Tapentadol HCl (NUCYNTA ER) 100 MG TB12    Phamacy: pick up

## 2014-01-10 NOTE — Telephone Encounter (Signed)
Jenkins pt. Has not established with [place]

## 2014-01-13 ENCOUNTER — Encounter: Payer: Self-pay | Admitting: Family Medicine

## 2014-01-13 ENCOUNTER — Ambulatory Visit (INDEPENDENT_AMBULATORY_CARE_PROVIDER_SITE_OTHER): Payer: Medicare Other | Admitting: Family Medicine

## 2014-01-13 VITALS — BP 150/78 | HR 64 | Temp 98.2°F | Ht 69.0 in | Wt 195.0 lb

## 2014-01-13 DIAGNOSIS — M545 Low back pain, unspecified: Secondary | ICD-10-CM

## 2014-01-13 DIAGNOSIS — IMO0002 Reserved for concepts with insufficient information to code with codable children: Secondary | ICD-10-CM

## 2014-01-13 DIAGNOSIS — G8929 Other chronic pain: Secondary | ICD-10-CM

## 2014-01-13 DIAGNOSIS — I10 Essential (primary) hypertension: Secondary | ICD-10-CM

## 2014-01-13 DIAGNOSIS — R7301 Impaired fasting glucose: Secondary | ICD-10-CM

## 2014-01-13 MED ORDER — TAPENTADOL HCL ER 100 MG PO TB12: 1.0000 | ORAL_TABLET | Freq: Two times a day (BID) | ORAL | Status: DC

## 2014-01-13 MED ORDER — BISOPROLOL-HYDROCHLOROTHIAZIDE 10-6.25 MG PO TABS: 1.0000 | ORAL_TABLET | Freq: Two times a day (BID) | ORAL | Status: DC

## 2014-01-13 NOTE — Assessment & Plan Note (Signed)
Systolic poorly controlled given goal <140 with history CVA. Increase bisoprolol-hctz to max dose at 20-12.5mg . COntinue amlodipine.

## 2014-01-13 NOTE — Assessment & Plan Note (Signed)
Last a1c 6.2 Advised needed regular exercise (whatever he is able to do) as well as to cut down on sweets.  Check A1c next visit.

## 2014-01-13 NOTE — Progress Notes (Signed)
Kevin Reddish, MD Phone: [phone]  Subjective:  Patient presents today to establish care. Chief complaint-noted.   Chronic Pain Low back pain and right hip pain. Thinks that hip pain is due to back pain. Preference would be not Dr. Nelva Bush for pain management. Also has history of left knee pain. Takes tapendtadol twice a day as well as gabapentin TID. He thinks the gabapentin is helping. The tapendtadol is the mainstay of his treatment.  ROS- No weakness in legs, no fecal or urinary incontinence  Hypertension BP Readings from Last 3 Encounters:  01/13/14 150/78  11/26/13 168/90  11/12/13 162/92  Home BP monitoring-yes, typically 140s/50s or 60s Compliant with medications-yes without side effects ROS-Denies any CP, HA, SOB, blurry vision, LE edema.   Heel Pain X 2 weeks. Worse with first step in morning. Moderate aching on bottom of foot. Better as he walks  ROS-no burning/tingling/paresthesias  Fasting hyperglycemia Last a1c 6.2. Discussed with patient his risk for diabetes.    The following were reviewed and entered/updated in epic: Past Medical History  Diagnosis Date  . Hyperlipidemia   . Hypertension   . Stroke June 2013    small cva. 2 spots noted  . Arthritis   . Peripheral vascular disease   . Wears glasses   . Full dentures   . Wears hearing aid     both ears   Patient Active Problem List   Diagnosis Date Noted  . Family history of malignant neoplasm of gastrointestinal tract 05/15/2013  . Personal history of colonic polyps 05/15/2013  . AAA (abdominal aortic aneurysm, ruptured) 01/17/2013  . Inguinal hernia 05/01/2012  . Occlusion and stenosis of carotid artery without mention of cerebral infarction 01/12/2012  . CVA (cerebral infarction) 11/17/2011  . THORACIC/LUMBOSACRAL NEURITIS/RADICULITIS UNSPEC 12/23/2009  . BURSITIS, HIP 12/21/2009  . MORTON'S NEUROMA, RIGHT 11/02/2009  . TREMOR, ESSENTIAL 10/05/2009  . ABDOMINAL AORTIC ANEURYSM REPAIR, HX OF  04/07/2009  . ACTINIC KERATOSIS, EAR, LEFT 03/09/2009  . UNSPECIFIED ANEMIA 12/05/2008  . 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 KNEE PAIN, RIGHT 10/08/2007  . 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 URINARY OBSTRUCTION UNSPECIFIED 06/04/2007  . Fasting hyperglycemia 02/05/2007  . HYPERLIPIDEMIA 01/03/2007  . HYPERTENSION 01/03/2007   Past Surgical History  Procedure Laterality Date  . Abdominal aortic aneurysm repair  2010  . Abdominal aortic aneurysm repair      2010  . Carotid endarterectomy  09/20/2004    Left  CEA  . Tonsillectomy    . Colonoscopy    . Upper gi endoscopy    . Inguinal hernia repair Left 02/05/2013    Procedure: HERNIA REPAIR INGUINAL ADULT;  Surgeon: Joyice Faster. Cornett, MD;  Location: [facility];  Service: General;  Laterality: Left;  . Insertion of mesh Left 02/05/2013    Procedure: INSERTION OF MESH;  Surgeon: Joyice Faster. Cornett, MD;  Location: [facility];  Service: General;  Laterality: Left;  . Testiclar cyst excision      Family History  Problem Relation Age of Onset  . Parkinsonism Father   . Dementia Father   . Colon cancer Mother     died in 46s    Medications- reviewed and updated Current Outpatient Prescriptions  Medication Sig Dispense Refill  . amLODipine (NORVASC) 10 MG tablet Take 1 tablet (10 mg total) by mouth daily.  30 tablet  3  . Ascorbic Acid (VITAMIN C) 500 MG CHEW Chew by mouth daily.        Marland Kitchen  atorvastatin (LIPITOR) 20 MG tablet Take 20 mg by mouth daily.      . bisoprolol-hydrochlorothiazide (ZIAC) 10-6.25 MG per tablet Take 1 tablet by mouth daily.      . clopidogrel (PLAVIX) 75 MG tablet Take 1 tablet (75 mg total) by mouth daily.  90 tablet  3  . fenofibrate (TRICOR) 145 MG tablet TAKE ONE TABLET BY MOUTH ONCE DAILY  30 tablet  0  . gabapentin (NEURONTIN) 100 MG capsule Take 1 capsule (100 mg total) by mouth 3 (three) times daily.  90 capsule  1  . Melatonin 3 MG TABS Take  1 tablet by mouth at bedtime.        . OMEGA 3 1000 MG CAPS Take 2 capsules by mouth daily.        . Saw Palmetto, Serenoa repens, 1000 MG CAPS Take by mouth daily.        . tamsulosin (FLOMAX) 0.4 MG CAPS capsule Take 1 capsule (0.4 mg total) by mouth daily.  90 capsule  3  . Tapentadol HCl (NUCYNTA ER) 100 MG TB12 Take 1 tablet by mouth 2 (two) times daily.  60 tablet  0  . tiZANidine (ZANAFLEX) 4 MG tablet TAKE ONE TABLET BY MOUTH EVERY 6 HOURS AS NEEDED.  30 tablet  0   No current facility-administered medications for this visit.    Allergies-reviewed and updated No Known Allergies  History   Social History  . Marital Status: Married    Spouse Name: N/A    Number of Children: N/A  . Years of Education: N/A   Social History Main Topics  . Smoking status: Former Smoker -- 2.00 packs/day for 40 years    Types: Cigarettes    Quit date: 05/30/1990  . Smokeless tobacco: Never Used     Comment: last smoked 02-03-13  . Alcohol Use: No  . Drug Use: No     Comment: has not done marijuana in a while, but may start back if pain in back worsens  . Sexual Activity: Yes   Other Topics Concern  . None   Social History Narrative   Married 33 years in 2015. Lived together for 12 years. 2 boys from first wife. 4 grandkids (1 in [place], 1 in 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       Hobbies: yardwork, [place] barn, woodwork, collect knive    ROS--See HPI   Objective: BP 150/78  Pulse 64  Temp(Src) 98.2 F (36.8 C)  Ht 5\' 9"  (1.753 m)  Wt 195 lb (88.451 kg)  BMI 28.78 kg/m2 Gen: NAD, resting comfortably in chair CV: RRR no murmurs rubs or gallops, no carotid bruits Lungs: CTAB no crackles, wheeze, rhonchi Abdomen: soft/nontender/nondistended/normal bowel sounds. Midline scar noted  Ext: no edema, 2+ radial pulses  Assessment/Plan:  Fasting hyperglycemia Last a1c 6.2 Advised needed  regular exercise (whatever he is able to do) as well as to cut down on sweets.  Check A1c next visit.   HYPERTENSION Systolic poorly controlled given goal <140 with history CVA. Increase bisoprolol-hctz to max dose at 20-12.5mg . COntinue amlodipine.   Chronic Pain. THORACIC/LUMBOSACRAL NEURITIS/RADICULITIS.  Refill tapentadol x 2 months and referred to pain management. Discussed with chronic pain-best managed by pain medication specialist.   Plantar fasciitis Discussed symptomatic relief and home exercise program (see handout)  Orders Placed This Encounter  Procedures  . Ambulatory referral to  Pain Clinic    Referral Priority:  Routine    Referral Type:  Consultation    Referral Reason:  Specialty Services Required    Requested Specialty:  Pain Medicine    Number of Visits Requested:  1    Meds ordered this encounter  Medications  . bisoprolol-hydrochlorothiazide (ZIAC) 10-6.25 MG per tablet    Sig: Take 1 tablet by mouth 2 (two) times daily.    Dispense:  60 tablet    Refill:  11  . DISCONTD: Tapentadol HCl (NUCYNTA ER) 100 MG TB12    Sig: Take 1 tablet by mouth 2 (two) times daily.    Dispense:  60 tablet    Refill:  0  . Tapentadol HCl (NUCYNTA ER) 100 MG TB12    Sig: Take 1 tablet by mouth 2 (two) times daily.    Dispense:  60 tablet    Refill:  0   .

## 2014-01-13 NOTE — Patient Instructions (Signed)
Blood pressure  Up some, let's increase your ZIAC to twice a day  Record your blood pressure. Bring cuff so we can compare  See me back in 2-3 weeks  Pain management  2 months RX  Refer to pain management  Plantar Fasciitis (Heel Spur Syndrome) with Rehab The plantar fascia is a fibrous, ligament-like, soft-tissue structure that spans the bottom of the foot. Plantar fasciitis is a condition that causes pain in the foot due to inflammation of the tissue. SYMPTOMS   Pain and tenderness on the underneath side of the foot.  Pain that worsens with standing or walking. CAUSES  Plantar fasciitis is caused by irritation and injury to the plantar fascia on the underneath side of the foot. Common mechanisms of injury include:  Direct trauma to bottom of the foot.  Damage to a small nerve that runs under the foot where the main fascia attaches to the heel bone.  Stress placed on the plantar fascia due to bone spurs. RISK INCREASES WITH:   Activities that place stress on the plantar fascia (running, jumping, pivoting, or cutting).  Poor strength and flexibility.  Improperly fitted shoes.  Tight calf muscles.  Flat feet.  Failure to warm-up properly before activity.  Obesity. PREVENTION  Warm up and stretch properly before activity.  Allow for adequate recovery between workouts.  Maintain physical fitness:  Strength, flexibility, and endurance.  Cardiovascular fitness.  Maintain a health body weight.  Avoid stress on the plantar fascia.  Wear properly fitted shoes, including arch supports for individuals who have flat feet. PROGNOSIS  If treated properly, then the symptoms of plantar fasciitis usually resolve without surgery. However, occasionally surgery is necessary. RELATED COMPLICATIONS   Recurrent symptoms that may result in a chronic condition.  Problems of the lower back that are caused by compensating for the injury, such as limping.  Pain or weakness of  the foot during push-off following surgery.  Chronic inflammation, scarring, and partial or complete fascia tear, occurring more often from repeated injections. TREATMENT  Treatment initially involves the use of ice and medication to help reduce pain and inflammation. The use of strengthening and stretching exercises may help reduce pain with activity, especially stretches of the Achilles tendon. These exercises may be performed at home or with a therapist. Your caregiver may recommend that you use heel cups of arch supports to help reduce stress on the plantar fascia. Occasionally, corticosteroid injections are given to reduce inflammation. If symptoms persist for greater than 6 months despite non-surgical (conservative), then surgery may be recommended.  MEDICATION   If pain medication is necessary, then nonsteroidal anti-inflammatory medications, such as aspirin and ibuprofen, or other minor pain relievers, such as acetaminophen, are often recommended.  Do not take pain medication within 7 days before surgery.  Prescription pain relievers may be given if deemed necessary by your caregiver. Use only as directed and only as much as you need.  Corticosteroid injections may be given by your caregiver. These injections should be reserved for the most serious cases, because they may only be given a certain number of times. HEAT AND COLD  Cold treatment (icing) relieves pain and reduces inflammation. Cold treatment should be applied for 10 to 15 minutes every 2 to 3 hours for inflammation and pain and immediately after any activity that aggravates your symptoms. Use ice packs or massage the area with a piece of ice (ice massage).  Heat treatment may be used prior to performing the stretching and strengthening activities prescribed  by your caregiver, physical therapist, or athletic trainer. Use a heat pack or soak the injury in warm water. SEEK IMMEDIATE MEDICAL CARE IF:  Treatment seems to offer no  benefit, or the condition worsens.  Any medications produce adverse side effects. EXERCISES RANGE OF MOTION (ROM) AND STRETCHING EXERCISES - Plantar Fasciitis (Heel Spur Syndrome) These exercises may help you when beginning to rehabilitate your injury. Your symptoms may resolve with or without further involvement from your physician, physical therapist or athletic trainer. While completing these exercises, remember:   Restoring tissue flexibility helps normal motion to return to the joints. This allows healthier, less painful movement and activity.  An effective stretch should be held for at least 30 seconds.  A stretch should never be painful. You should only feel a gentle lengthening or release in the stretched tissue. RANGE OF MOTION - Toe Extension, Flexion  Sit with your right / left leg crossed over your opposite knee.  Grasp your toes and gently pull them back toward the top of your foot. You should feel a stretch on the bottom of your toes and/or foot.  Hold this stretch for __________ seconds.  Now, gently pull your toes toward the bottom of your foot. You should feel a stretch on the top of your toes and or foot.  Hold this stretch for __________ seconds. Repeat __________ times. Complete this stretch __________ times per day.  RANGE OF MOTION - Ankle Dorsiflexion, Active Assisted  Remove shoes and sit on a chair that is preferably not on a carpeted surface.  Place right / left foot under knee. Extend your opposite leg for support.  Keeping your heel down, slide your right / left foot back toward the chair until you feel a stretch at your ankle or calf. If you do not feel a stretch, slide your bottom forward to the edge of the chair, while still keeping your heel down.  Hold this stretch for __________ seconds. Repeat __________ times. Complete this stretch __________ times per day.  STRETCH - Gastroc, Standing  Place hands on wall.  Extend right / left leg, keeping  the front knee somewhat bent.  Slightly point your toes inward on your back foot.  Keeping your right / left heel on the floor and your knee straight, shift your weight toward the wall, not allowing your back to arch.  You should feel a gentle stretch in the right / left calf. Hold this position for __________ seconds. Repeat __________ times. Complete this stretch __________ times per day. STRETCH - Soleus, Standing  Place hands on wall.  Extend right / left leg, keeping the other knee somewhat bent.  Slightly point your toes inward on your back foot.  Keep your right / left heel on the floor, bend your back knee, and slightly shift your weight over the back leg so that you feel a gentle stretch deep in your back calf.  Hold this position for __________ seconds. Repeat __________ times. Complete this stretch __________ times per day. STRETCH - Gastrocsoleus, Standing  Note: This exercise can place a lot of stress on your foot and ankle. Please complete this exercise only if specifically instructed by your caregiver.   Place the ball of your right / left foot on a step, keeping your other foot firmly on the same step.  Hold on to the wall or a rail for balance.  Slowly lift your other foot, allowing your body weight to press your heel down over the edge of the  step.  You should feel a stretch in your right / left calf.  Hold this position for __________ seconds.  Repeat this exercise with a slight bend in your right / left knee. Repeat __________ times. Complete this stretch __________ times per day.  STRENGTHENING EXERCISES - Plantar Fasciitis (Heel Spur Syndrome)  These exercises may help you when beginning to rehabilitate your injury. They may resolve your symptoms with or without further involvement from your physician, physical therapist or athletic trainer. While completing these exercises, remember:   Muscles can gain both the endurance and the strength needed for  everyday activities through controlled exercises.  Complete these exercises as instructed by your physician, physical therapist or athletic trainer. Progress the resistance and repetitions only as guided. STRENGTH - Towel Curls  Sit in a chair positioned on a non-carpeted surface.  Place your foot on a towel, keeping your heel on the floor.  Pull the towel toward your heel by only curling your toes. Keep your heel on the floor.  If instructed by your physician, physical therapist or athletic trainer, add ____________________ at the end of the towel. Repeat __________ times. Complete this exercise __________ times per day. STRENGTH - Ankle Inversion  Secure one end of a rubber exercise band/tubing to a fixed object (table, pole). Loop the other end around your foot just before your toes.  Place your fists between your knees. This will focus your strengthening at your ankle.  Slowly, pull your big toe up and in, making sure the band/tubing is positioned to resist the entire motion.  Hold this position for __________ seconds.  Have your muscles resist the band/tubing as it slowly pulls your foot back to the starting position. Repeat __________ times. Complete this exercises __________ times per day.  Document Released: 05/16/2005 Document Revised: 08/08/2011 Document Reviewed: 08/28/2008 [facility] Patient Information 2015 [place], [place]. This information is not intended to replace advice given to you by your health care provider. Make sure you discuss any questions you have with your health care provider.

## 2014-01-13 NOTE — Assessment & Plan Note (Signed)
Refill tapentadol x 2 months and referred to pain management. Discussed with chronic pain-best managed by pain medication specialist.

## 2014-01-20 NOTE — Telephone Encounter (Signed)
Dr Retail banker refilled

## 2014-01-22 ENCOUNTER — Encounter: Payer: Self-pay | Admitting: Family

## 2014-01-23 ENCOUNTER — Ambulatory Visit (INDEPENDENT_AMBULATORY_CARE_PROVIDER_SITE_OTHER)
Admission: RE | Admit: 2014-01-23 | Discharge: 2014-01-23 | Disposition: A | Discharge status: Home / Self Care | Payer: Medicare Other | Source: Ambulatory Visit | Attending: Family | Admitting: Family

## 2014-01-23 ENCOUNTER — Encounter: Payer: Self-pay | Admitting: Family

## 2014-01-23 ENCOUNTER — Ambulatory Visit (INDEPENDENT_AMBULATORY_CARE_PROVIDER_SITE_OTHER): Payer: Medicare Other | Admitting: Family

## 2014-01-23 ENCOUNTER — Ambulatory Visit (HOSPITAL_COMMUNITY)
Admission: RE | Admit: 2014-01-23 | Discharge: 2014-01-23 | Disposition: A | Discharge status: Home / Self Care | Payer: Medicare Other | Source: Ambulatory Visit | Attending: Family | Admitting: Family

## 2014-01-23 ENCOUNTER — Other Ambulatory Visit: Payer: Medicare Other

## 2014-01-23 ENCOUNTER — Other Ambulatory Visit: Payer: Self-pay | Admitting: Pain Medicine

## 2014-01-23 ENCOUNTER — Other Ambulatory Visit: Payer: Self-pay | Admitting: *Deleted

## 2014-01-23 VITALS — BP 157/74 | HR 46 | Resp 14 | Ht 69.0 in | Wt 195.0 lb

## 2014-01-23 DIAGNOSIS — M542 Cervicalgia: Secondary | ICD-10-CM

## 2014-01-23 DIAGNOSIS — I6529 Occlusion and stenosis of unspecified carotid artery: Secondary | ICD-10-CM

## 2014-01-23 DIAGNOSIS — M25512 Pain in left shoulder: Principal | ICD-10-CM

## 2014-01-23 DIAGNOSIS — I713 Abdominal aortic aneurysm, ruptured, unspecified: Secondary | ICD-10-CM

## 2014-01-23 DIAGNOSIS — M25511 Pain in right shoulder: Secondary | ICD-10-CM

## 2014-01-23 DIAGNOSIS — I739 Peripheral vascular disease, unspecified: Secondary | ICD-10-CM

## 2014-01-23 DIAGNOSIS — Z48812 Encounter for surgical aftercare following surgery on the circulatory system: Secondary | ICD-10-CM | POA: Insufficient documentation

## 2014-01-23 NOTE — Progress Notes (Signed)
[facility] [place] HISTORY AND PHYSICAL   MRN : [id number]  History of Present Illness:   Kevin [name] is a 73 y.o. male patient of Dr. Oneida Alar who presents for routine follow up s/p OAR (2010, Dr. Oneida Alar) and left CEA (2006).  The patient has had left lower quadrant chronic abdominal pain that he attributes to an inguinal hernia, states Dr. Arnoldo Morale aware.  Has chronic intermittent right hip pain and low back pain, aggravated by certain activities; his hip pain, this was evaluated by Dr. Ricard Dillon, pt states he was told that his hip is fine but his lumbar spine has issues. His just started seeing pain management in August, 2015.  Had TIA in 2013: mild left facial and left leg tingling that lasted about 30 minutes, was evaluated at [facility] ED. He denies any further TIA activity.   He denies non healing wounds. 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  His walking is limited by his low back pain, but still is able to mow his lawn, has to stop every 10 minutes.   Pt Diabetic: No Pt smoker: former smoker, quit in 1989  Pt meds include: Statin :Yes Betablocker: Yes ASA: No Other anticoagulants/antiplatelets: Plavix  Current Outpatient Prescriptions  Medication Sig Dispense Refill  . amLODipine (NORVASC) 10 MG tablet Take 1 tablet (10 mg total) by mouth daily.  30 tablet  3  . Ascorbic Acid (VITAMIN C) 500 MG CHEW Chew by mouth daily.        Marland Kitchen atorvastatin (LIPITOR) 20 MG tablet Take 20 mg by mouth daily.      . bisoprolol-hydrochlorothiazide (ZIAC) 10-6.25 MG per tablet Take 1 tablet by mouth 2 (two) times daily.  60 tablet  11  . clopidogrel (PLAVIX) 75 MG tablet Take 1 tablet (75 mg total) by mouth daily.  90 tablet  3  . fenofibrate (TRICOR) 145 MG tablet TAKE ONE TABLET BY MOUTH ONCE DAILY  30 tablet  0  . gabapentin (NEURONTIN) 100 MG capsule Take 1 capsule (100 mg total) by mouth 3 (three) times daily.   90 capsule  1  . Ibuprofen-Diphenhydramine Cit (ADVIL PM PO) Take by mouth at bedtime. HELP WITH SLEEPING      . Melatonin 3 MG TABS Take 1 tablet by mouth at bedtime.        . OMEGA 3 1000 MG CAPS Take 2 capsules by mouth daily.        . Saw Palmetto, Serenoa repens, 1000 MG CAPS Take by mouth daily.        . tamsulosin (FLOMAX) 0.4 MG CAPS capsule Take 1 capsule (0.4 mg total) by mouth daily.  90 capsule  3  . Tapentadol HCl (NUCYNTA ER) 100 MG TB12 Take 1 tablet by mouth 2 (two) times daily.  60 tablet  0  . tiZANidine (ZANAFLEX) 4 MG tablet TAKE ONE TABLET BY MOUTH EVERY 6 HOURS AS NEEDED.  30 tablet  0   No current facility-administered medications for this visit.    Past Medical History  Diagnosis Date  . Hyperlipidemia   . Hypertension   . Stroke June 2013    small cva. 2 spots noted  . Peripheral vascular disease   . Wears glasses   . Full dentures   . Wears hearing aid     both ears  . 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     Marland Kitchen  Arthritis     DDD- Lower Back    Social History History  Substance Use Topics  . Smoking status: Former Smoker -- 2.00 packs/day for 40 years    Types: Cigarettes    Quit date: 05/30/1990  . Smokeless tobacco: Never Used     Comment: last smoked 02-03-13  . Alcohol Use: No    Family History Family History  Problem Relation Age of Onset  . Parkinsonism Father   . Dementia Father   . Cancer Father     Throat  . Colon cancer Mother     died in 54s  . Cancer Mother     Colon    Surgical History Past Surgical History  Procedure Laterality Date  . Abdominal aortic aneurysm repair  2010  . Abdominal aortic aneurysm repair      2010  . Carotid endarterectomy  09/20/2004    Left  CEA  . Tonsillectomy    . Colonoscopy    . Upper gi endoscopy    . Inguinal hernia repair Left 02/05/2013    Procedure: HERNIA REPAIR INGUINAL ADULT;  Surgeon: Joyice Faster. Cornett, MD;  Location: [place];  Service:  General;  Laterality: Left;  . Insertion of mesh Left 02/05/2013    Procedure: INSERTION OF MESH;  Surgeon: Joyice Faster. Cornett, MD;  Location: [place];  Service: General;  Laterality: Left;  . Testiclar cyst excision      No Known Allergies  Current Outpatient Prescriptions  Medication Sig Dispense Refill  . amLODipine (NORVASC) 10 MG tablet Take 1 tablet (10 mg total) by mouth daily.  30 tablet  3  . Ascorbic Acid (VITAMIN C) 500 MG CHEW Chew by mouth daily.        Marland Kitchen atorvastatin (LIPITOR) 20 MG tablet Take 20 mg by mouth daily.      . bisoprolol-hydrochlorothiazide (ZIAC) 10-6.25 MG per tablet Take 1 tablet by mouth 2 (two) times daily.  60 tablet  11  . clopidogrel (PLAVIX) 75 MG tablet Take 1 tablet (75 mg total) by mouth daily.  90 tablet  3  . fenofibrate (TRICOR) 145 MG tablet TAKE ONE TABLET BY MOUTH ONCE DAILY  30 tablet  0  . gabapentin (NEURONTIN) 100 MG capsule Take 1 capsule (100 mg total) by mouth 3 (three) times daily.  90 capsule  1  . Ibuprofen-Diphenhydramine Cit (ADVIL PM PO) Take by mouth at bedtime. HELP WITH SLEEPING      . Melatonin 3 MG TABS Take 1 tablet by mouth at bedtime.        . OMEGA 3 1000 MG CAPS Take 2 capsules by mouth daily.        . Saw Palmetto, Serenoa repens, 1000 MG CAPS Take by mouth daily.        . tamsulosin (FLOMAX) 0.4 MG CAPS capsule Take 1 capsule (0.4 mg total) by mouth daily.  90 capsule  3  . Tapentadol HCl (NUCYNTA ER) 100 MG TB12 Take 1 tablet by mouth 2 (two) times daily.  60 tablet  0  . tiZANidine (ZANAFLEX) 4 MG tablet TAKE ONE TABLET BY MOUTH EVERY 6 HOURS AS NEEDED.  30 tablet  0   No current facility-administered medications for this visit.     REVIEW OF SYSTEMS: See HPI for pertinent positives and negatives.  Physical Examination Filed Vitals:   01/23/14 1052 01/23/14 1055  BP: 161/66 157/74  Pulse: 46 46  Resp:  14  Height:  5\' 9"  (1.753  m)  Weight:  195 lb (88.451 kg)  SpO2:  99%   Body mass index  is 28.78 kg/(m^2).  General: A&O x 3, WD.  Pulmonary: Sym exp, good air movt, CTAB, no rales, rhonchi, & wheezing.  Cardiac: RRR, Nl S1, S2, no Murmurs, rubs or gallops   Vascular:  Vessel  Right  Left   Radial  2+Palpable  2+Palpable   Carotid  audible, without bruit  audible, without bruit   Aorta  Not palpable  N/A   Femoral  2+Palpable  1+Palpable   Popliteal  1+palpable  Not palpable   PT  Biphasic by Doppler Biphasic by Doppler  DP  Biphasic by Doppler Monophasic by Doppler   Gastrointestinal: soft, NTND, -G/R, - HSM, - masses, - CVAT B.  Musculoskeletal: M/S 5/5 throughout, Extremities without ischemic changes.  Neurologic: Pain and light touch intact in extremities, Motor exam as listed above. CN 2-12 intact.   Significant Diagnostic Studies:   Non-Invasive Vascular Imaging:   ASSESSMENT:  Kevin [name] is a 73 y.o. male s/p OAR (2010, Dr. Oneida Alar) and left CEA (2006), and PAD. Left ABI decreased significantly in the last year from mild to moderate arterial occlusive disease; right ABI remain stable with evidence of moderate arterial occlusive disease. But his lumbar spine issues and OA in both knees seem to limit his walking and more so than claudication. No TIA since 2013. Right ICA stenosis is at the upper end of 40-59% today, patent left ICA (CEA site).   PLAN:   Based on today's exam and non-invasive vascular lab results, the patient will follow up in 6 months with the following tests: carotid Duplex and ABI's. Graduated walking program. I discussed in depth with the patient the nature of atherosclerosis, and emphasized the importance of maximal medical management including strict control of blood pressure, blood glucose, and lipid levels, obtaining regular exercise, and cessation of smoking.  The patient is aware that without maximal medical management the underlying atherosclerotic disease process will progress, limiting the benefit of any interventions.  The  patient was given information about stroke prevention and what symptoms should prompt the patient to seek immediate medical care.  The patient was given information about PAD including signs, symptoms, treatment, what symptoms should prompt the patient to seek immediate medical care, and risk reduction measures to take. Thank you for allowing [place] to participate in this patient's care.  Clemon Chambers, RN, MSN, FNP-C [facility] Office: [phone]  Clinic MD: [facility] 01/23/2014 10:57 AM

## 2014-01-23 NOTE — Patient Instructions (Signed)

## 2014-01-24 ENCOUNTER — Encounter: Payer: Self-pay | Admitting: Family Medicine

## 2014-01-26 ENCOUNTER — Ambulatory Visit
Admission: RE | Admit: 2014-01-26 | Discharge: 2014-01-26 | Disposition: A | Discharge status: Home / Self Care | Payer: Medicare Other | Source: Ambulatory Visit | Attending: Pain Medicine | Admitting: Pain Medicine

## 2014-01-26 DIAGNOSIS — M25511 Pain in right shoulder: Secondary | ICD-10-CM

## 2014-01-26 DIAGNOSIS — M542 Cervicalgia: Secondary | ICD-10-CM

## 2014-01-26 DIAGNOSIS — M25512 Pain in left shoulder: Principal | ICD-10-CM

## 2014-01-27 ENCOUNTER — Ambulatory Visit (INDEPENDENT_AMBULATORY_CARE_PROVIDER_SITE_OTHER): Payer: Medicare Other | Admitting: Family Medicine

## 2014-01-27 ENCOUNTER — Encounter: Payer: Self-pay | Admitting: Family Medicine

## 2014-01-27 VITALS — BP 160/62 | HR 57 | Temp 97.8°F | Wt 198.0 lb

## 2014-01-27 DIAGNOSIS — R7309 Other abnormal glucose: Secondary | ICD-10-CM

## 2014-01-27 DIAGNOSIS — N183 Chronic kidney disease, stage 3 unspecified: Secondary | ICD-10-CM

## 2014-01-27 DIAGNOSIS — N184 Chronic kidney disease, stage 4 (severe): Secondary | ICD-10-CM | POA: Insufficient documentation

## 2014-01-27 DIAGNOSIS — I10 Essential (primary) hypertension: Secondary | ICD-10-CM

## 2014-01-27 HISTORY — DX: Chronic kidney disease, stage 3 unspecified: N18.30

## 2014-01-27 LAB — HEMOGLOBIN A1C: Hgb A1c MFr Bld: 6.3 % (ref 4.6–6.5)

## 2014-01-27 LAB — COMPREHENSIVE METABOLIC PANEL
ALT: 28 U/L (ref 0–53)
AST: 28 U/L (ref 0–37)
Albumin: 4.1 g/dL (ref 3.5–5.2)
Alkaline Phosphatase: 31 U/L — ABNORMAL LOW (ref 39–117)
BUN: 28 mg/dL — ABNORMAL HIGH (ref 6–23)
CALCIUM: 9 mg/dL (ref 8.4–10.5)
CO2: 26 meq/L (ref 19–32)
Chloride: 103 mEq/L (ref 96–112)
Creatinine, Ser: 2.1 mg/dL — ABNORMAL HIGH (ref 0.4–1.5)
GFR: 32.52 mL/min — AB (ref >60.00)
GLUCOSE: 107 mg/dL — AB (ref 70–99)
Potassium: 4.3 mEq/L (ref 3.5–5.1)
SODIUM: 138 meq/L (ref 135–145)
TOTAL PROTEIN: 7.5 g/dL (ref 6.0–8.3)
Total Bilirubin: 0.5 mg/dL (ref 0.2–1.2)

## 2014-01-27 LAB — CBC
HCT: 36.1 % — ABNORMAL LOW (ref 39.0–52.0)
Hemoglobin: 12.3 g/dL — ABNORMAL LOW (ref 13.0–17.0)
MCHC: 34.1 g/dL (ref 30.0–36.0)
MCV: 90 fl (ref 78.0–100.0)
PLATELETS: 207 10*3/uL (ref 150.0–400.0)
RBC: 4.01 Mil/uL — ABNORMAL LOW (ref 4.22–5.81)
RDW: 13.8 % (ref 11.5–15.5)
WBC: 7.6 10*3/uL (ref 4.0–10.5)

## 2014-01-27 MED ORDER — LISINOPRIL 5 MG PO TABS: 2.5000 mg | ORAL_TABLET | Freq: Every day | ORAL | Status: DC

## 2014-01-27 MED ORDER — BISOPROLOL-HYDROCHLOROTHIAZIDE 10-6.25 MG PO TABS: 1.0000 | ORAL_TABLET | Freq: Every day | ORAL | Status: DC

## 2014-01-27 NOTE — Assessment & Plan Note (Addendum)
Reasonable control given home #s. Home cuff accurate with in house measurement 160/74 on my read and home cuff 158/72. Do not need to push in office blood pressure down further. Bradycardia noted with increased bisoprolol. Will go back to daily ziac and add lisinopril 2.5 mg given CKD stage III, return visit in 2 weeks.

## 2014-01-27 NOTE — Assessment & Plan Note (Addendum)
Start lisinopril 2.5 mg. Repeat bmet in 2 weeks with BP check. Avoid nsaids advised.

## 2014-01-27 NOTE — Progress Notes (Signed)
Garret Reddish, MD Phone: [phone]  Subjective:   Kevin [name] is a 73 y.o. year old very pleasant male patient who presents with the following:  Hypertension CKD stage III BP Readings from Last 3 Encounters:  01/27/14 160/62  01/23/14 157/74  01/13/14 150/78  Home BP monitoring-yes, typically <140/90 with about once a week systolic above this.  Compliant with medications-yes without side effects, including BID ziac now.  Noted today, CKD Stage III and added to problem list. Not on ace-i currently.  Patient takes advil-PM nightly, asked him to stop.  ROS-Denies any CP, HA, SOB, blurry vision, LE edema, transient weakness, orthopnea, PND.   Past Medical History- Patient Active Problem List   Diagnosis Date Noted  . CKD (chronic kidney disease), stage III 01/27/2014    Priority: High  . AAA (abdominal aortic aneurysm, ruptured) 01/17/2013    Priority: High  . CVA (cerebral infarction) 11/17/2011    Priority: High  . Chronic Pain. THORACIC/LUMBOSACRAL NEURITIS/RADICULITIS.  12/23/2009    Priority: High  . Personal history of colonic polyps 05/15/2013    Priority: Medium  . Carotid Artery Stenosis L s/p endarterectomy 01/12/2012    Priority: Medium  . TREMOR, ESSENTIAL 10/05/2009    Priority: Medium  . UNSPECIFIED ANEMIA 12/05/2008    Priority: Medium  . BPH (benign prostatic hyperplasia) 06/04/2007    Priority: Medium  . Fasting hyperglycemia 02/05/2007    Priority: Medium  . HYPERLIPIDEMIA 01/03/2007    Priority: Medium  . HYPERTENSION 01/03/2007    Priority: Medium  . Family history of malignant neoplasm of gastrointestinal tract 05/15/2013    Priority: Low  . Inguinal hernia 05/01/2012    Priority: Low  . BURSITIS, HIP 12/21/2009    Priority: Low  . ACTINIC KERATOSIS, EAR, LEFT 03/09/2009    Priority: Low  . Left knee Osteoarthritis 11/02/2007    Priority: Low  . 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 PVD (peripheral vascular  disease) 01/23/2014  . Aftercare following surgery of the circulatory system, [place] 01/23/2014   Medications- reviewed and updated Current Outpatient Prescriptions  Medication Sig Dispense Refill  . amLODipine (NORVASC) 10 MG tablet Take 1 tablet (10 mg total) by mouth daily.  30 tablet  3  . Ascorbic Acid (VITAMIN C) 500 MG CHEW Chew by mouth daily.        Marland Kitchen atorvastatin (LIPITOR) 20 MG tablet Take 20 mg by mouth daily.      . bisoprolol-hydrochlorothiazide (ZIAC) 10-6.25 MG per tablet Take 1 tablet by mouth daily.  30 tablet  11  . clopidogrel (PLAVIX) 75 MG tablet Take 1 tablet (75 mg total) by mouth daily.  90 tablet  3  . fenofibrate (TRICOR) 145 MG tablet TAKE ONE TABLET BY MOUTH ONCE DAILY  30 tablet  0  . gabapentin (NEURONTIN) 100 MG capsule Take 1 capsule (100 mg total) by mouth 3 (three) times daily.  90 capsule  1  . Ibuprofen-Diphenhydramine Cit (ADVIL PM PO) Take by mouth at bedtime. HELP WITH SLEEPING      . Melatonin 3 MG TABS Take 1 tablet by mouth at bedtime.        . OMEGA 3 1000 MG CAPS Take 2 capsules by mouth daily.        . Saw Palmetto, Serenoa repens, 1000 MG CAPS Take by mouth daily.        . tamsulosin (FLOMAX) 0.4 MG CAPS capsule Take 1 capsule (0.4 mg total) by mouth daily.  90 capsule  3  . Tapentadol HCl (NUCYNTA ER) 100 MG TB12 Take 1 tablet by mouth 2 (two) times daily.  60 tablet  0  . tiZANidine (ZANAFLEX) 4 MG tablet TAKE ONE TABLET BY MOUTH EVERY 6 HOURS AS NEEDED.  30 tablet  0  . lisinopril (PRINIVIL,ZESTRIL) 5 MG tablet Take 0.5 tablets (2.5 mg total) by mouth daily. 1/2 a pill only until you see Dr. [name].  30 tablet  3   No current facility-administered medications for this visit.    Objective: BP 160/62  Pulse 57  Temp(Src) 97.8 F (36.6 C)  Wt 198 lb (89.812 kg) Gen: NAD, resting comfortably CV: RRR no murmurs rubs or gallops Lungs: CTAB no crackles, wheeze, rhonchi Ext: no edema, mild pain to palpation at insertion of plantar  fasci  Results for orders placed in visit on 01/27/14 (from the past 24 hour(s))  COMPREHENSIVE METABOLIC PANEL     Status: Abnormal   Collection Time    01/27/14  9:46 AM      Result Value Ref Range   Sodium 138  135 - 145 mEq/L   Potassium 4.3  3.5 - 5.1 mEq/L   Chloride 103  96 - 112 mEq/L   CO2 26  19 - 32 mEq/L   Glucose, Bld 107 (*) 70 - 99 mg/dL   BUN 28 (*) 6 - 23 mg/dL   Creatinine, Ser 2.1 (*) 0.4 - 1.5 mg/dL   Total Bilirubin 0.5  0.2 - 1.2 mg/dL   Alkaline Phosphatase 31 (*) 39 - 117 U/L   AST 28  0 - 37 U/L   ALT 28  0 - 53 U/L   Total Protein 7.5  6.0 - 8.3 g/dL   Albumin 4.1  3.5 - 5.2 g/dL   Calcium 9.0  8.4 - 10.5 mg/dL   GFR 32.52 (*) >60.00 mL/min  HEMOGLOBIN A1C     Status: None   Collection Time    01/27/14  9:46 AM      Result Value Ref Range   Hemoglobin A1C 6.3  4.6 - 6.5 %  CBC     Status: Abnormal   Collection Time    01/27/14  9:46 AM      Result Value Ref Range   WBC 7.6  4.0 - 10.5 K/uL   RBC 4.01 (*) 4.22 - 5.81 Mil/uL   Platelets 207.0  150.0 - 400.0 K/uL   Hemoglobin 12.3 (*) 13.0 - 17.0 g/dL   HCT 36.1 (*) 39.0 - 52.0 %   MCV 90.0  78.0 - 100.0 fl   MCHC 34.1  30.0 - 36.0 g/dL   RDW 13.8  11.5 - 15.5 %    Assessment/Plan:  CKD (chronic kidney disease), stage III Start lisinopril 2.5 mg. Repeat bmet in 2 weeks with BP check.   HYPERTENSION Reasonable control given home #s. Home cuff accurate with in house measurement 160/74 on my read and home cuff 158/72. Do not need to push in office blood pressure down further. Bradycardia noted with increased bisoprolol. Will go back to daily ziac and add lisinopril 2.5 mg given CKD stage III, return visit in 2 weeks.    Orders Placed This Encounter  Procedures  . Comprehensive metabolic panel    [place]  . Hemoglobin A1c    [place]  . CBC    [place]    Meds ordered this encounter  Medications  . bisoprolol-hydrochlorothiazide (ZIAC) 10-6.25 MG per tablet    Sig: Take 1 tablet by  mouth  daily.    Dispense:  30 tablet    Refill:  11  . lisinopril (PRINIVIL,ZESTRIL) 5 MG tablet    Sig: Take 0.5 tablets (2.5 mg total) by mouth daily. 1/2 a pill only until you see Dr. [name].    Dispense:  30 tablet    Refill:  3

## 2014-01-27 NOTE — Patient Instructions (Addendum)
Labs today  No advil  Based on labs, Likely we will go back to once a day ziac and start a medicine called lisinopril. We will have you back in 2 weeks if we decide to do this.  Handicap form filled out

## 2014-02-07 ENCOUNTER — Other Ambulatory Visit: Payer: Self-pay | Admitting: Internal Medicine

## 2014-02-09 ENCOUNTER — Other Ambulatory Visit: Payer: Self-pay | Admitting: Internal Medicine

## 2014-02-11 NOTE — Telephone Encounter (Signed)
Is this ok to refill?  

## 2014-02-14 ENCOUNTER — Other Ambulatory Visit: Payer: Self-pay | Admitting: Internal Medicine

## 2014-02-17 ENCOUNTER — Telehealth: Payer: Self-pay

## 2014-02-17 MED ORDER — LISINOPRIL 5 MG PO TABS: 2.5000 mg | ORAL_TABLET | Freq: Every day | ORAL | Status: DC

## 2014-02-17 NOTE — Telephone Encounter (Signed)
Error

## 2014-03-14 ENCOUNTER — Other Ambulatory Visit: Payer: Self-pay | Admitting: Family Medicine

## 2014-03-21 ENCOUNTER — Other Ambulatory Visit: Payer: Self-pay | Admitting: Family

## 2014-03-27 ENCOUNTER — Other Ambulatory Visit: Payer: Self-pay | Admitting: Family Medicine

## 2014-03-31 ENCOUNTER — Telehealth: Payer: Self-pay | Admitting: Family Medicine

## 2014-03-31 NOTE — Telephone Encounter (Signed)
Pt states [name] is sending a fax b/c pt is going to have shoulder surgery (tendon repair) and  Pt is on blood thinners.  pls advise if ov is needed.

## 2014-04-02 NOTE — Telephone Encounter (Signed)
Form received and given to Dr. [name], will pt need OV?

## 2014-04-02 NOTE — Telephone Encounter (Signed)
Since pt has not been seen since 01/27/14 I scheduled him for a 15 min follow up on 04/04/14 at at 2:15PM.

## 2014-04-04 ENCOUNTER — Ambulatory Visit (INDEPENDENT_AMBULATORY_CARE_PROVIDER_SITE_OTHER): Payer: Medicare Other | Admitting: Family Medicine

## 2014-04-04 ENCOUNTER — Encounter: Payer: Self-pay | Admitting: Family Medicine

## 2014-04-04 VITALS — BP 130/68 | Temp 98.3°F | Wt 196.0 lb

## 2014-04-04 DIAGNOSIS — Z01818 Encounter for other preprocedural examination: Secondary | ICD-10-CM

## 2014-04-04 DIAGNOSIS — N183 Chronic kidney disease, stage 3 unspecified: Secondary | ICD-10-CM

## 2014-04-04 DIAGNOSIS — I10 Essential (primary) hypertension: Secondary | ICD-10-CM

## 2014-04-04 DIAGNOSIS — G894 Chronic pain syndrome: Secondary | ICD-10-CM

## 2014-04-04 LAB — BASIC METABOLIC PANEL
BUN: 30 mg/dL — AB (ref 6–23)
CALCIUM: 8.9 mg/dL (ref 8.4–10.5)
CHLORIDE: 105 meq/L (ref 96–112)
CO2: 22 mEq/L (ref 19–32)
CREATININE: 2.1 mg/dL — AB (ref 0.4–1.5)
GFR: 33.23 mL/min — ABNORMAL LOW (ref >60.00)
Glucose, Bld: 107 mg/dL — ABNORMAL HIGH (ref 70–99)
Potassium: 4.4 mEq/L (ref 3.5–5.1)
Sodium: 137 mEq/L (ref 135–145)

## 2014-04-04 MED ORDER — OXYCODONE-ACETAMINOPHEN 5-325 MG PO TABS
0.5000 | ORAL_TABLET | Freq: Three times a day (TID) | ORAL | Status: DC | PRN
Start: 2014-04-04 — End: 2014-05-01

## 2014-04-04 NOTE — Assessment & Plan Note (Addendum)
Well controlled continue Bisoprolol-hctz max + amlodipine 10mg  + lisinopril 2.5mg 

## 2014-04-04 NOTE — Patient Instructions (Addendum)
Cleared for surgery Continue current medications Continue current pain medication until done and then can take 1/2 a percocet up to 3x a day but would prefer twice a day. See me in 1 month to see how this is working. Take this note to your pain management specialist. I strongly advise you to try the facet joint injections and continue to work with them until they feel they can only offer continued narcotic pain medication management.  Check kidneys today since we started lisinopril.

## 2014-04-04 NOTE — Assessment & Plan Note (Signed)
Started lisinopril 2.5mg . BP at goal. Check creatinine today given recent start.

## 2014-04-04 NOTE — Progress Notes (Signed)
Garret Reddish, MD Phone: [phone]  Subjective:   Kevin [name] is a 73 y.o. year old very pleasant male patient who presents with the following:  Upcoming left shoulder surgery Plan to complete before end of year. Arthroscopic. Low bleeding risk.  History of small CVA on Plavix.  No history of MI and no family history of MI. History of echocardiogram in 2013 with EF 55% and no valvular abnormality.  Able to go up a flight of stairs without chest pain or shortness of breath.  ROS- no chest pain or shortness of breath  Chronic Pain Syndrome Frustrated by pain management. Has not had facet joint injections yet. Frustrated by [employer] and interested in alternative. States they have not provided rx yet at preferred pain management. Plan for shoulder for left shoulder which has been bothering him and one source of pain. Back and right hip continue to bother him (point of facet injections) ROS-no fecal or urinary incontinence  Hypertension-stable CKD-stable  BP Readings from Last 3 Encounters:  04/04/14 130/68  01/27/14 160/62  01/23/14 157/74  BP tolerating lisinopril 1/2 tab.  Home BP monitoring-no Compliant with medications-yes without side effects ROS-Denies any CP, HA, SOB, blurry vision.    Past Medical History- Patient Active Problem List   Diagnosis Date Noted  . AAA (abdominal aortic aneurysm, ruptured) 01/17/2013    Priority: High  . CVA (cerebral infarction) 11/17/2011    Priority: High  . Chronic pain syndrome 12/23/2009    Priority: High  . CKD (chronic kidney disease), stage III 01/27/2014    Priority: Medium  . Personal history of colonic polyps 05/15/2013    Priority: Medium  . Carotid Artery Stenosis L s/p endarterectomy 01/12/2012    Priority: Medium  . TREMOR, ESSENTIAL 10/05/2009    Priority: Medium  . UNSPECIFIED ANEMIA 12/05/2008    Priority: Medium  . BPH (benign prostatic hyperplasia) 06/04/2007    Priority: Medium  . Fasting  hyperglycemia 02/05/2007    Priority: Medium  . HYPERLIPIDEMIA 01/03/2007    Priority: Medium  . Essential hypertension 01/03/2007    Priority: Medium  . Family history of malignant neoplasm of gastrointestinal tract 05/15/2013    Priority: Low  . Inguinal hernia 05/01/2012    Priority: Low  . BURSITIS, HIP 12/21/2009    Priority: Low  . ACTINIC KERATOSIS, EAR, LEFT 03/09/2009    Priority: Low  . Left knee Osteoarthritis 11/02/2007    Priority: Low  . 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 PVD (peripheral vascular disease) 01/23/2014  . Aftercare following surgery of the circulatory system, [place] 01/23/2014   Medications- reviewed and updated Current Outpatient Prescriptions  Medication Sig Dispense Refill  . amLODipine (NORVASC) 10 MG tablet Take 1 tablet (10 mg total) by mouth daily. 30 tablet 3  . Ascorbic Acid (VITAMIN C) 500 MG CHEW Chew by mouth daily.      Marland Kitchen atorvastatin (LIPITOR) 20 MG tablet Take 20 mg by mouth daily.    . bisoprolol-hydrochlorothiazide (ZIAC) 10-6.25 MG per tablet Take 1 tablet by mouth daily. 30 tablet 11  . clopidogrel (PLAVIX) 75 MG tablet TAKE ONE TABLET BY MOUTH ONCE DAILY 90 tablet 0  . fenofibrate (TRICOR) 145 MG tablet TAKE ONE TABLET BY MOUTH ONCE DAILY 30 tablet 2  . gabapentin (NEURONTIN) 100 MG capsule Take 1 capsule (100 mg total) by mouth 3 (three) times daily. 90 capsule 1  . Ibuprofen-Diphenhydramine Cit (ADVIL PM PO) Take by mouth at bedtime.  HELP WITH SLEEPING    . lisinopril (PRINIVIL,ZESTRIL) 5 MG tablet Take 0.5 tablets (2.5 mg total) by mouth daily. 1/2 a pill only until you see Dr. [name]. 90 tablet 3  . Melatonin 3 MG TABS Take 1 tablet by mouth at bedtime.      . OMEGA 3 1000 MG CAPS Take 2 capsules by mouth daily.      . Saw Palmetto, Serenoa repens, 1000 MG CAPS Take by mouth daily.      . tamsulosin (FLOMAX) 0.4 MG CAPS capsule Take 1 capsule (0.4 mg total) by mouth daily. 90 capsule 3  . Tapentadol  HCl (NUCYNTA ER) 100 MG TB12 Take 1 tablet by mouth 2 (two) times daily. 60 tablet 0  . tiZANidine (ZANAFLEX) 4 MG tablet TAKE ONE TABLET BY MOUTH EVERY 6 HOURS AS NEEDED. 30 tablet 0  . oxyCODONE-acetaminophen (PERCOCET/ROXICET) 5-325 MG per tablet Take 0.5 tablets by mouth 3 (three) times daily as needed for moderate pain or severe pain (May fill 04/04/14). 45 tablet 0   No current facility-administered medications for this visit.    Objective: BP 130/68 mmHg  Temp(Src) 98.3 F (36.8 C)  Wt 196 lb (88.905 kg) Gen: NAD, resting comfortably in chair, wearing glasses CV: RRR no murmurs rubs or gallops Lungs: CTAB no crackles, wheeze, rhonchi Abdomen: soft/nontender/nondistended/normal bowel sounds.  Ext: no edema, 2+ PT pulses Skin: warm, dry, no rash Neuro: grossly normal, moves all extremities   Assessment/Plan:  CKD (chronic kidney disease), stage III Started lisinopril 2.5mg . BP at goal. Check creatinine today given recent start.   Chronic pain syndrome Upcoming shoulder surgery for shoulder pain. Hip and back could be facet related. Awaiting facet injections. Taking Nucenta once a day until runs out then convert to 2.5 mg percocet up to TID. 100mg  tapendatol that he is taking once daily is about 20mg  oxycodone but we will attempt a wean and see how patient does especially with trying to treat underlying pain with surgery and facet injections.   Essential hypertension Well controlled continue Bisoprolol-hctz max + amlodipine 10mg  + lisinopril 2.5mg    Surgical clearance Able to do 4 mets easily without symptoms. Low bleeding risk surgery so would continue Plavix. He is medically maximized for arthroscopic shoulder surgery although all surgeries carry risk.   Orders Placed This Encounter  Procedures  . Basic metabolic panel        Meds ordered this encounter  Medications  . oxyCODONE-acetaminophen (PERCOCET/ROXICET) 5-325 MG per tablet    Sig: Take 0.5 tablets by  mouth 3 (three) times daily as needed for moderate pain or severe pain (May fill 04/04/14).    Dispense:  45 tablet    Refill:  0  enough for 1 month

## 2014-04-04 NOTE — Assessment & Plan Note (Signed)
Upcoming shoulder surgery for shoulder pain. Hip and back could be facet related. Awaiting facet injections. Taking Nucenta once a day until runs out then convert to 2.5 mg percocet up to TID. 100mg  tapendatol that he is taking once daily is about 20mg  oxycodone but we will attempt a wean and see how patient does especially with trying to treat underlying pain with surgery and facet injections.

## 2014-04-18 ENCOUNTER — Encounter (HOSPITAL_COMMUNITY): Payer: Self-pay

## 2014-04-18 ENCOUNTER — Other Ambulatory Visit: Payer: Self-pay | Admitting: Family

## 2014-04-18 ENCOUNTER — Encounter (HOSPITAL_COMMUNITY)
Admission: RE | Admit: 2014-04-18 | Discharge: 2014-04-18 | Disposition: A | Discharge status: Home / Self Care | Payer: Medicare Other | Source: Ambulatory Visit | Attending: Orthopedic Surgery | Admitting: Orthopedic Surgery

## 2014-04-18 ENCOUNTER — Other Ambulatory Visit: Payer: Self-pay

## 2014-04-18 DIAGNOSIS — Z7902 Long term (current) use of antithrombotics/antiplatelets: Secondary | ICD-10-CM | POA: Diagnosis not present

## 2014-04-18 DIAGNOSIS — Z87891 Personal history of nicotine dependence: Secondary | ICD-10-CM | POA: Diagnosis not present

## 2014-04-18 DIAGNOSIS — K5790 Diverticulosis of intestine, part unspecified, without perforation or abscess without bleeding: Secondary | ICD-10-CM | POA: Diagnosis not present

## 2014-04-18 DIAGNOSIS — M19012 Primary osteoarthritis, left shoulder: Secondary | ICD-10-CM | POA: Diagnosis not present

## 2014-04-18 DIAGNOSIS — I739 Peripheral vascular disease, unspecified: Secondary | ICD-10-CM | POA: Diagnosis not present

## 2014-04-18 DIAGNOSIS — M7542 Impingement syndrome of left shoulder: Secondary | ICD-10-CM | POA: Diagnosis not present

## 2014-04-18 DIAGNOSIS — M069 Rheumatoid arthritis, unspecified: Secondary | ICD-10-CM | POA: Diagnosis not present

## 2014-04-18 DIAGNOSIS — Z8673 Personal history of transient ischemic attack (TIA), and cerebral infarction without residual deficits: Secondary | ICD-10-CM | POA: Diagnosis not present

## 2014-04-18 DIAGNOSIS — I10 Essential (primary) hypertension: Secondary | ICD-10-CM | POA: Diagnosis not present

## 2014-04-18 DIAGNOSIS — H269 Unspecified cataract: Secondary | ICD-10-CM | POA: Diagnosis not present

## 2014-04-18 DIAGNOSIS — Z79899 Other long term (current) drug therapy: Secondary | ICD-10-CM | POA: Diagnosis not present

## 2014-04-18 DIAGNOSIS — M5136 Other intervertebral disc degeneration, lumbar region: Secondary | ICD-10-CM | POA: Diagnosis not present

## 2014-04-18 DIAGNOSIS — M75122 Complete rotator cuff tear or rupture of left shoulder, not specified as traumatic: Secondary | ICD-10-CM | POA: Diagnosis not present

## 2014-04-18 DIAGNOSIS — E785 Hyperlipidemia, unspecified: Secondary | ICD-10-CM | POA: Diagnosis not present

## 2014-04-18 HISTORY — DX: Frequency of micturition: R35.0

## 2014-04-18 HISTORY — DX: Rheumatoid arthritis, unspecified: M06.9

## 2014-04-18 HISTORY — DX: Urgency of urination: R39.15

## 2014-04-18 HISTORY — DX: Pain in unspecified joint: M25.50

## 2014-04-18 HISTORY — DX: Personal history of other medical treatment: Z92.89

## 2014-04-18 HISTORY — DX: Pneumonia, unspecified organism: J18.9

## 2014-04-18 HISTORY — DX: Diverticulosis of intestine, part unspecified, without perforation or abscess without bleeding: K57.90

## 2014-04-18 HISTORY — DX: Insomnia, unspecified: G47.00

## 2014-04-18 HISTORY — DX: Rheumatic fever without heart involvement: I00

## 2014-04-18 HISTORY — DX: Personal history of colonic polyps: Z86.010

## 2014-04-18 HISTORY — DX: Other muscle spasm: M62.838

## 2014-04-18 HISTORY — DX: Transient cerebral ischemic attack, unspecified: G45.9

## 2014-04-18 HISTORY — DX: Personal history of colon polyps, unspecified: Z86.0100

## 2014-04-18 LAB — COMPREHENSIVE METABOLIC PANEL
ALBUMIN: 4 g/dL (ref 3.5–5.2)
ALK PHOS: 29 U/L — AB (ref 39–117)
ALT: 22 U/L (ref 0–53)
AST: 25 U/L (ref 0–37)
Anion gap: 15 (ref 5–15)
BUN: 29 mg/dL — ABNORMAL HIGH (ref 6–23)
CO2: 21 mEq/L (ref 19–32)
Calcium: 9 mg/dL (ref 8.4–10.5)
Chloride: 102 mEq/L (ref 96–112)
Creatinine, Ser: 1.96 mg/dL — ABNORMAL HIGH (ref 0.50–1.35)
GFR calc Af Amer: 37 mL/min — ABNORMAL LOW (ref >90)
GFR calc non Af Amer: 32 mL/min — ABNORMAL LOW (ref >90)
Glucose, Bld: 120 mg/dL — ABNORMAL HIGH (ref 70–99)
POTASSIUM: 4.4 meq/L (ref 3.7–5.3)
SODIUM: 138 meq/L (ref 137–147)
Total Bilirubin: 0.3 mg/dL (ref 0.3–1.2)
Total Protein: 7.2 g/dL (ref 6.0–8.3)

## 2014-04-18 LAB — CBC WITH DIFFERENTIAL/PLATELET
BASOS ABS: 0.1 10*3/uL (ref 0.0–0.1)
BASOS PCT: 1 % (ref 0–1)
EOS ABS: 0.5 10*3/uL (ref 0.0–0.7)
EOS PCT: 6 % — AB (ref 0–5)
HCT: 36.3 % — ABNORMAL LOW (ref 39.0–52.0)
Hemoglobin: 12.4 g/dL — ABNORMAL LOW (ref 13.0–17.0)
LYMPHS ABS: 1.7 10*3/uL (ref 0.7–4.0)
Lymphocytes Relative: 23 % (ref 12–46)
MCH: 30.3 pg (ref 26.0–34.0)
MCHC: 34.2 g/dL (ref 30.0–36.0)
MCV: 88.8 fL (ref 78.0–100.0)
Monocytes Absolute: 0.6 10*3/uL (ref 0.1–1.0)
Monocytes Relative: 9 % (ref 3–12)
NEUTROS PCT: 61 % (ref 43–77)
Neutro Abs: 4.5 10*3/uL (ref 1.7–7.7)
PLATELETS: 220 10*3/uL (ref 150–400)
RBC: 4.09 MIL/uL — ABNORMAL LOW (ref 4.22–5.81)
RDW: 13.3 % (ref 11.5–15.5)
WBC: 7.3 10*3/uL (ref 4.0–10.5)

## 2014-04-18 LAB — APTT: aPTT: 35 seconds (ref 24–37)

## 2014-04-18 LAB — PROTIME-INR
INR: 1.07 (ref 0.00–1.49)
Prothrombin Time: 14 seconds (ref 11.6–15.2)

## 2014-04-18 MED ORDER — CHLORHEXIDINE GLUCONATE 4 % EX LIQD: 60.0000 mL | Freq: Once | CUTANEOUS | Status: DC

## 2014-04-18 NOTE — Pre-Procedure Instructions (Signed)
Kevin [name]  04/18/2014   Your procedure is scheduled on:  Thurs, Dec 3 @ 10:10 AM  Report to [facility]  at 8:00 AM.  Call this number if you have problems the morning of surgery: [phone]   Remember:   Do not eat food or drink liquids after midnight.   Take these medicines the morning of surgery with A SIP OF WATER: Amlodipine(Norvasc),Bisoprolol-HCTZ(Ziac),Gabapentin(Neurontin),Pain Pill(if needed),and Flomax(Tamsulosin)               Stop taking your Omega 3 and Excedrin. No Goody's,BC's,Aleve,Ibuprofen,or any Herbal Medications   Do not wear jewelry, make-up or nail polish.  Do not wear lotions, powders, or perfumes.   Men may shave face and neck.  Do not bring valuables to the hospital.  [facility] is not responsible                  for any belongings or valuables.               Contacts, dentures or bridgework may not be worn into surgery.  Leave suitcase in the car. After surgery it may be brought to your room.  For patients admitted to the hospital, discharge time is determined by your                treatment team.               Patients discharged the day of surgery will not be allowed to drive  home.    Special Instructions:   - Preparing for Surgery  Before surgery, you can play an important role.  Because skin is not sterile, your skin needs to be as free of germs as possible.  You can reduce the number of germs on you skin by washing with CHG (chlorahexidine gluconate) soap before surgery.  CHG is an antiseptic cleaner which kills germs and bonds with the skin to continue killing germs even after washing.  Please DO NOT use if you have an allergy to CHG or antibacterial soaps.  If your skin becomes reddened/irritated stop using the CHG and inform your nurse when you arrive at Short Stay.  Do not shave (including legs and underarms) for at least 48 hours prior to the first CHG shower.  You may shave your face.  Please follow these  instructions carefully:   1.  Shower with CHG Soap the night before surgery and the                                morning of Surgery.  2.  If you choose to wash your hair, wash your hair first as usual with your       normal shampoo.  3.  After you shampoo, rinse your hair and body thoroughly to remove the                      Shampoo.  4.  Use CHG as you would any other liquid soap.  You can apply chg directly       to the skin and wash gently with scrungie or a clean washcloth.  5.  Apply the CHG Soap to your body ONLY FROM THE NECK DOWN.        Do not use on open wounds or open sores.  Avoid contact with your eyes,       ears, mouth and genitals (  private parts).  Wash genitals (private parts)       with your normal soap.  6.  Wash thoroughly, paying special attention to the area where your surgery        will be performed.  7.  Thoroughly rinse your body with warm water from the neck down.  8.  DO NOT shower/wash with your normal soap after using and rinsing off       the CHG Soap.  9.  Pat yourself dry with a clean towel.            10.  Wear clean pajamas.            11.  Place clean sheets on your bed the night of your first shower and do not        sleep with pets.  Day of Surgery  Do not apply any lotions/deoderants the morning of surgery.  Please wear clean clothes to the hospital/surgery center.     Please read over the following fact sheets that you were given: Pain Booklet, Coughing and Deep Breathing and Surgical Site Infection Prevention

## 2014-04-18 NOTE — Progress Notes (Signed)
   04/18/14 1248  OBSTRUCTIVE SLEEP APNEA  Have you ever been diagnosed with sleep apnea through a sleep study? No  Do you snore loudly (loud enough to be heard through closed doors)?  1  Do you often feel tired, fatigued, or sleepy during the daytime? 0  Has anyone observed you stop breathing during your sleep? 1  Do you have, or are you being treated for high blood pressure? 1  BMI more than 35 kg/m2? 0  Age over 75 years old? 1  Neck circumference greater than 40 cm/16 inches? 0 (16.5)  Gender: 1  Obstructive Sleep Apnea Score 5  Score 4 or greater  Results sent to PCP

## 2014-04-18 NOTE — Progress Notes (Signed)
Pt doesn't have a cardiologist  Echo reports in epic from 2010/2013  > 45yrs ago pt had a stress test as sent by medical md for part of routine physical  Medical MD is Dr.[name]  Denies EKG or CXR in past yr

## 2014-04-30 ENCOUNTER — Telehealth: Payer: Self-pay

## 2014-04-30 MED ORDER — CEFAZOLIN SODIUM-DEXTROSE 2-3 GM-% IV SOLR
2.0000 g | INTRAVENOUS | Status: AC
  Administered 2014-05-01: 2 g via INTRAVENOUS

## 2014-04-30 NOTE — Telephone Encounter (Signed)
Is this ok to refill?  

## 2014-04-30 NOTE — Telephone Encounter (Signed)
Rx request for Zanaflex 4 mg tablet- Take 1 tablet by mouth every 6 hours as needed #30.  Pls advise.

## 2014-04-30 NOTE — Telephone Encounter (Signed)
yes

## 2014-05-01 ENCOUNTER — Encounter (HOSPITAL_COMMUNITY): Payer: Self-pay | Admitting: Anesthesiology

## 2014-05-01 ENCOUNTER — Encounter (HOSPITAL_COMMUNITY)
Admission: RE | Disposition: A | Discharge status: Home / Self Care | Payer: Self-pay | Source: Ambulatory Visit | Attending: Orthopedic Surgery

## 2014-05-01 ENCOUNTER — Ambulatory Visit (HOSPITAL_COMMUNITY): Payer: Medicare Other | Admitting: Anesthesiology

## 2014-05-01 ENCOUNTER — Ambulatory Visit (HOSPITAL_COMMUNITY)
Admission: RE | Admit: 2014-05-01 | Discharge: 2014-05-01 | Disposition: A | Discharge status: Home / Self Care | Payer: Medicare Other | Source: Ambulatory Visit | Attending: Orthopedic Surgery | Admitting: Orthopedic Surgery

## 2014-05-01 DIAGNOSIS — Z79899 Other long term (current) drug therapy: Secondary | ICD-10-CM | POA: Insufficient documentation

## 2014-05-01 DIAGNOSIS — M75122 Complete rotator cuff tear or rupture of left shoulder, not specified as traumatic: Secondary | ICD-10-CM | POA: Insufficient documentation

## 2014-05-01 DIAGNOSIS — E785 Hyperlipidemia, unspecified: Secondary | ICD-10-CM | POA: Diagnosis not present

## 2014-05-01 DIAGNOSIS — M069 Rheumatoid arthritis, unspecified: Secondary | ICD-10-CM | POA: Insufficient documentation

## 2014-05-01 DIAGNOSIS — Z8673 Personal history of transient ischemic attack (TIA), and cerebral infarction without residual deficits: Secondary | ICD-10-CM | POA: Insufficient documentation

## 2014-05-01 DIAGNOSIS — M7542 Impingement syndrome of left shoulder: Secondary | ICD-10-CM | POA: Insufficient documentation

## 2014-05-01 DIAGNOSIS — I10 Essential (primary) hypertension: Secondary | ICD-10-CM | POA: Insufficient documentation

## 2014-05-01 DIAGNOSIS — Z7902 Long term (current) use of antithrombotics/antiplatelets: Secondary | ICD-10-CM | POA: Insufficient documentation

## 2014-05-01 DIAGNOSIS — H269 Unspecified cataract: Secondary | ICD-10-CM | POA: Insufficient documentation

## 2014-05-01 DIAGNOSIS — K5790 Diverticulosis of intestine, part unspecified, without perforation or abscess without bleeding: Secondary | ICD-10-CM | POA: Insufficient documentation

## 2014-05-01 DIAGNOSIS — M19012 Primary osteoarthritis, left shoulder: Secondary | ICD-10-CM | POA: Diagnosis not present

## 2014-05-01 DIAGNOSIS — I739 Peripheral vascular disease, unspecified: Secondary | ICD-10-CM | POA: Insufficient documentation

## 2014-05-01 DIAGNOSIS — M5136 Other intervertebral disc degeneration, lumbar region: Secondary | ICD-10-CM | POA: Insufficient documentation

## 2014-05-01 DIAGNOSIS — Z87891 Personal history of nicotine dependence: Secondary | ICD-10-CM | POA: Insufficient documentation

## 2014-05-01 HISTORY — PX: SHOULDER ARTHROSCOPY WITH ROTATOR CUFF REPAIR AND SUBACROMIAL DECOMPRESSION: SHX5686

## 2014-05-01 SURGERY — SHOULDER ARTHROSCOPY WITH ROTATOR CUFF REPAIR AND SUBACROMIAL DECOMPRESSION
Anesthesia: General | Laterality: Left

## 2014-05-01 MED ORDER — PROPOFOL 10 MG/ML IV BOLUS
INTRAVENOUS | Status: AC
  Filled 2014-05-01: qty 20

## 2014-05-01 MED ORDER — FENTANYL CITRATE 0.05 MG/ML IJ SOLN
INTRAMUSCULAR | Status: DC | PRN
  Administered 2014-05-01: 100 ug via INTRAVENOUS

## 2014-05-01 MED ORDER — PHENYLEPHRINE HCL 10 MG/ML IJ SOLN
10.0000 mg | INTRAMUSCULAR | Status: DC | PRN
  Administered 2014-05-01: 10 ug/min via INTRAVENOUS

## 2014-05-01 MED ORDER — LIDOCAINE HCL (CARDIAC) 20 MG/ML IV SOLN
INTRAVENOUS | Status: AC
  Filled 2014-05-01: qty 5

## 2014-05-01 MED ORDER — PHENYLEPHRINE HCL 10 MG/ML IJ SOLN
INTRAMUSCULAR | Status: DC | PRN
  Administered 2014-05-01: 80 ug via INTRAVENOUS
  Administered 2014-05-01: 120 ug via INTRAVENOUS

## 2014-05-01 MED ORDER — HYDROMORPHONE HCL 1 MG/ML IJ SOLN: 0.2500 mg | INTRAMUSCULAR | Status: DC | PRN

## 2014-05-01 MED ORDER — ONDANSETRON HCL 4 MG/2ML IJ SOLN
INTRAMUSCULAR | Status: DC | PRN
  Administered 2014-05-01: 4 mg via INTRAVENOUS

## 2014-05-01 MED ORDER — FENTANYL CITRATE 0.05 MG/ML IJ SOLN
INTRAMUSCULAR | Status: AC
  Administered 2014-05-01: 50 ug via INTRAVENOUS
  Filled 2014-05-01: qty 2

## 2014-05-01 MED ORDER — GLYCOPYRROLATE 0.2 MG/ML IJ SOLN
INTRAMUSCULAR | Status: AC
  Filled 2014-05-01: qty 2

## 2014-05-01 MED ORDER — SUCCINYLCHOLINE CHLORIDE 20 MG/ML IJ SOLN
INTRAMUSCULAR | Status: AC
  Filled 2014-05-01: qty 1

## 2014-05-01 MED ORDER — ONDANSETRON HCL 4 MG/2ML IJ SOLN
INTRAMUSCULAR | Status: AC
  Filled 2014-05-01: qty 2

## 2014-05-01 MED ORDER — NEOSTIGMINE METHYLSULFATE 10 MG/10ML IV SOLN
INTRAVENOUS | Status: DC | PRN
Start: 2014-05-01 — End: 2014-05-01
  Administered 2014-05-01: 3 mg via INTRAVENOUS

## 2014-05-01 MED ORDER — ONDANSETRON HCL 4 MG/2ML IJ SOLN: 4.0000 mg | Freq: Once | INTRAMUSCULAR | Status: DC | PRN

## 2014-05-01 MED ORDER — SODIUM CHLORIDE 0.9 % IR SOLN
Status: DC | PRN
  Administered 2014-05-01 (×5): 3000 mL

## 2014-05-01 MED ORDER — ROCURONIUM BROMIDE 50 MG/5ML IV SOLN
INTRAVENOUS | Status: AC
  Filled 2014-05-01: qty 1

## 2014-05-01 MED ORDER — ROCURONIUM BROMIDE 100 MG/10ML IV SOLN
INTRAVENOUS | Status: DC | PRN
  Administered 2014-05-01: 30 mg via INTRAVENOUS

## 2014-05-01 MED ORDER — FENTANYL CITRATE 0.05 MG/ML IJ SOLN
50.0000 ug | INTRAMUSCULAR | Status: DC | PRN
  Administered 2014-05-01: 50 ug via INTRAVENOUS

## 2014-05-01 MED ORDER — DIAZEPAM 5 MG PO TABS: 2.5000 mg | ORAL_TABLET | Freq: Four times a day (QID) | ORAL | Status: DC | PRN

## 2014-05-01 MED ORDER — GLYCOPYRROLATE 0.2 MG/ML IJ SOLN
INTRAMUSCULAR | Status: AC
  Filled 2014-05-01: qty 4

## 2014-05-01 MED ORDER — SODIUM CHLORIDE 0.9 % IJ SOLN
INTRAMUSCULAR | Status: AC
  Filled 2014-05-01: qty 10

## 2014-05-01 MED ORDER — FENTANYL CITRATE 0.05 MG/ML IJ SOLN
INTRAMUSCULAR | Status: AC
  Filled 2014-05-01: qty 5

## 2014-05-01 MED ORDER — LACTATED RINGERS IV SOLN
INTRAVENOUS | Status: DC
  Administered 2014-05-01 (×2): via INTRAVENOUS

## 2014-05-01 MED ORDER — PROPOFOL 10 MG/ML IV BOLUS
INTRAVENOUS | Status: DC | PRN
  Administered 2014-05-01: 170 mg via INTRAVENOUS

## 2014-05-01 MED ORDER — PHENYLEPHRINE 40 MCG/ML (10ML) SYRINGE FOR IV PUSH (FOR BLOOD PRESSURE SUPPORT)
PREFILLED_SYRINGE | INTRAVENOUS | Status: AC
  Filled 2014-05-01: qty 10

## 2014-05-01 MED ORDER — EPHEDRINE SULFATE 50 MG/ML IJ SOLN
INTRAMUSCULAR | Status: AC
  Filled 2014-05-01: qty 1

## 2014-05-01 MED ORDER — HYDROMORPHONE HCL 2 MG PO TABS: 2.0000 mg | ORAL_TABLET | ORAL | Status: DC | PRN

## 2014-05-01 MED ORDER — LIDOCAINE HCL (CARDIAC) 20 MG/ML IV SOLN
INTRAVENOUS | Status: DC | PRN
  Administered 2014-05-01: 40 mg via INTRAVENOUS

## 2014-05-01 MED ORDER — GLYCOPYRROLATE 0.2 MG/ML IJ SOLN
INTRAMUSCULAR | Status: DC | PRN
  Administered 2014-05-01 (×2): 0.4 mg via INTRAVENOUS

## 2014-05-01 SURGICAL SUPPLY — 69 items
ANCHOR PEEK 4.75X19.1 SWLK C (Anchor) ×6 IMPLANT
ANCHOR PEEK CORKSCREW 4.5 (Anchor) ×2 IMPLANT
ANCHOR PEEK CORKSCREW 4.5MM (Anchor) ×1 IMPLANT
BLADE CUTTER GATOR 3.5 (BLADE) ×3 IMPLANT
BLADE GREAT WHITE 4.2 (BLADE) ×2 IMPLANT
BLADE GREAT WHITE 4.2MM (BLADE) ×1
BLADE SURG 11 STRL SS (BLADE) ×3 IMPLANT
BOOTCOVER CLEANROOM LRG (PROTECTIVE WEAR) ×6 IMPLANT
BUR 3.5 LG SPHERICAL (BURR) IMPLANT
BUR OVAL 4.0 (BURR) ×3 IMPLANT
BURR 3.5 LG SPHERICAL (BURR)
BURR 3.5MM LG SPHERICAL (BURR)
CANISTER SUCT LVC 12 LTR MEDI- (MISCELLANEOUS) ×3 IMPLANT
CANNULA ACUFLEX KIT 5X76 (CANNULA) ×3 IMPLANT
CANNULA DRILOCK 5.0MMX75MM (CANNULA) ×3
CANNULA DRILOCK 5.0X75 (CANNULA) ×6 IMPLANT
CLOSURE STERI-STRIP 1/2X4 (GAUZE/BANDAGES/DRESSINGS) ×1
CLOSURE WOUND 1/2 X4 (GAUZE/BANDAGES/DRESSINGS)
CLSR STERI-STRIP ANTIMIC 1/2X4 (GAUZE/BANDAGES/DRESSINGS) ×2 IMPLANT
CONNECTOR 5 IN 1 STRAIGHT STRL (MISCELLANEOUS) ×3 IMPLANT
DRAPE INCISE 23X17 IOBAN STRL (DRAPES) ×2
DRAPE INCISE IOBAN 23X17 STRL (DRAPES) ×1 IMPLANT
DRAPE INCISE IOBAN 66X45 STRL (DRAPES) ×3 IMPLANT
DRAPE ORTHO SPLIT 77X108 STRL (DRAPES) ×4
DRAPE STERI 35X30 U-POUCH (DRAPES) ×3 IMPLANT
DRAPE SURG 17X11 SM STRL (DRAPES) ×3 IMPLANT
DRAPE SURG ORHT 6 SPLT 77X108 (DRAPES) ×2 IMPLANT
DRAPE U-SHAPE 47X51 STRL (DRAPES) ×3 IMPLANT
DRSG PAD ABDOMINAL 8X10 ST (GAUZE/BANDAGES/DRESSINGS) ×9 IMPLANT
DURAPREP 26ML APPLICATOR (WOUND CARE) ×3 IMPLANT
ELECT REM PT RETURN 9FT ADLT (ELECTROSURGICAL)
ELECTRODE REM PT RTRN 9FT ADLT (ELECTROSURGICAL) IMPLANT
GAUZE SPONGE 4X4 12PLY STRL (GAUZE/BANDAGES/DRESSINGS) IMPLANT
GLOVE BIO SURGEON STRL SZ7.5 (GLOVE) ×3 IMPLANT
GLOVE BIO SURGEON STRL SZ8 (GLOVE) ×3 IMPLANT
GLOVE EUDERMIC 7 POWDERFREE (GLOVE) ×3 IMPLANT
GLOVE SS BIOGEL STRL SZ 7.5 (GLOVE) ×1 IMPLANT
GLOVE SUPERSENSE BIOGEL SZ 7.5 (GLOVE) ×2
GOWN STRL REUS W/ TWL LRG LVL3 (GOWN DISPOSABLE) ×1 IMPLANT
GOWN STRL REUS W/ TWL XL LVL3 (GOWN DISPOSABLE) ×2 IMPLANT
GOWN STRL REUS W/TWL LRG LVL3 (GOWN DISPOSABLE) ×2
GOWN STRL REUS W/TWL XL LVL3 (GOWN DISPOSABLE) ×4
KIT BASIN OR (CUSTOM PROCEDURE TRAY) ×3 IMPLANT
KIT ROOM TURNOVER OR (KITS) ×3 IMPLANT
KIT SHOULDER TRACTION (DRAPES) ×3 IMPLANT
MANIFOLD NEPTUNE II (INSTRUMENTS) ×6 IMPLANT
NDL SUT 6 .5 CRC .975X.05 MAYO (NEEDLE) IMPLANT
NEEDLE MAYO TAPER (NEEDLE)
NEEDLE SPNL 18GX3.5 QUINCKE PK (NEEDLE) ×3 IMPLANT
NS IRRIG 1000ML POUR BTL (IV SOLUTION) ×3 IMPLANT
PACK SHOULDER (CUSTOM PROCEDURE TRAY) ×3 IMPLANT
PAD ARMBOARD 7.5X6 YLW CONV (MISCELLANEOUS) ×6 IMPLANT
SET ARTHROSCOPY TUBING (MISCELLANEOUS) ×2
SET ARTHROSCOPY TUBING LN (MISCELLANEOUS) ×1 IMPLANT
SLING ARM FOAM STRAP LRG (SOFTGOODS) ×3 IMPLANT
SLING ARM LRG ADULT FOAM STRAP (SOFTGOODS) IMPLANT
SLING ARM MED ADULT FOAM STRAP (SOFTGOODS) IMPLANT
SPONGE GAUZE 4X4 12PLY STER LF (GAUZE/BANDAGES/DRESSINGS) ×3 IMPLANT
SPONGE LAP 4X18 X RAY DECT (DISPOSABLE) ×3 IMPLANT
STRIP CLOSURE SKIN 1/2X4 (GAUZE/BANDAGES/DRESSINGS) IMPLANT
SUT MNCRL AB 3-0 PS2 18 (SUTURE) ×3 IMPLANT
SUT PDS AB 0 CT 36 (SUTURE) IMPLANT
SUT RETRIEVER GRASP 30 DEG (SUTURE) ×3 IMPLANT
SYR 20CC LL (SYRINGE) IMPLANT
TAPE PAPER 3X10 WHT MICROPORE (GAUZE/BANDAGES/DRESSINGS) IMPLANT
TOWEL OR 17X24 6PK STRL BLUE (TOWEL DISPOSABLE) ×3 IMPLANT
TOWEL OR 17X26 10 PK STRL BLUE (TOWEL DISPOSABLE) ×3 IMPLANT
WAND SUCTION MAX 4MM 90S (SURGICAL WAND) ×3 IMPLANT
WATER STERILE IRR 1000ML POUR (IV SOLUTION) ×3 IMPLANT

## 2014-05-01 NOTE — Anesthesia Preprocedure Evaluation (Addendum)
Anesthesia Evaluation  Patient identified by MRN, date of birth, ID band Patient awake    Reviewed: Allergy & Precautions, H&P , NPO status , Patient's Chart, lab work & pertinent test results  Airway Mallampati: I       Dental  (+) Edentulous Upper, Edentulous Lower   Pulmonary former smoker,          Cardiovascular hypertension, Pt. on medications + Peripheral Vascular Disease     Neuro/Psych TIA Neuromuscular disease    GI/Hepatic   Endo/Other    Renal/GU Renal disease     Musculoskeletal  (+) Arthritis -,   Abdominal   Peds  Hematology  (+) anemia ,   Anesthesia Other Findings   Reproductive/Obstetrics                            Anesthesia Physical Anesthesia Plan  ASA: III  Anesthesia Plan: General   Post-op Pain Management:    Induction: Intravenous  Airway Management Planned: Oral ETT  Additional Equipment:   Intra-op Plan:   Post-operative Plan: Extubation in OR  Informed Consent: I have reviewed the patients History and Physical, chart, labs and discussed the procedure including the risks, benefits and alternatives for the proposed anesthesia with the patient or authorized representative who has indicated his/her understanding and acceptance.   Dental advisory given  Plan Discussed with: CRNA and Anesthesiologist  Anesthesia Plan Comments:         Anesthesia Quick Evaluation

## 2014-05-01 NOTE — Anesthesia Postprocedure Evaluation (Signed)
  Anesthesia Post-op Note  Patient: Kevin [name]  Procedure(s) Performed: Procedure(s): LEFT ARTHROSCOPY SHOULDER SUBACROMIAL DECOMPRESSION,DISTAL CLAVICAL RESECTION AND ROTATOR CUFF REPAIR (Left)  Patient Location: PACU  Anesthesia Type:GA combined with regional for post-op pain  Level of Consciousness: awake, alert , oriented and patient cooperative  Airway and Oxygen Therapy: Patient Spontanous Breathing  Post-op Pain: none  Post-op Assessment: Post-op Vital signs reviewed, Patient's Cardiovascular Status Stable, Respiratory Function Stable, Patent Airway, No signs of Nausea or vomiting and Pain level controlled  Post-op Vital Signs: stable  Last Vitals:  Filed Vitals:   05/01/14 1330  BP:   Pulse:   Temp: 36.1 C  Resp:     Complications: No apparent anesthesia complications

## 2014-05-01 NOTE — Op Note (Signed)
05/01/2014  12:28 PM  PATIENT:   Kevin [name]  73 y.o. male  PRE-OPERATIVE DIAGNOSIS:  LEFT SHOULDER IMPINGEMENT, AC joint OA, rotator cuff tear  POST-OPERATIVE DIAGNOSIS:  Same with labral tear  PROCEDURE:  LSA, labral debridement, SAD, DCR, RCR  SURGEON:  Payten Hobin, Metta Clines M.D.  ASSISTANTS: Shuford pac   ANESTHESIA:   GET + ISB  EBL: min  SPECIMEN:  none  Drains: none   PATIENT DISPOSITION:  PACU - hemodynamically stable.    PLAN OF CARE: Discharge to home after PACU  Dictation# [id number]

## 2014-05-01 NOTE — Anesthesia Procedure Notes (Addendum)
Anesthesia Regional Block:  Interscalene brachial plexus block  Pre-Anesthetic Checklist: ,, timeout performed, Correct Patient, Correct Site, Correct Laterality, Correct Procedure, Correct Position, site marked, Risks and benefits discussed,  Surgical consent,  Pre-op evaluation,  At surgeon's request and post-op pain management  Laterality: Left  Prep: Maximum Sterile Barrier Precautions used, chloraprep and alcohol swabs       Needles:  Injection technique: Single-shot  Needle Type: Stimulator Needle - 40        Needle insertion depth: 4 cm   Additional Needles:  Procedures: nerve stimulator Interscalene brachial plexus block  Nerve Stimulator or Paresthesia:  Response: 0.5 mA, 0.1 ms, 4 cm  Additional Responses:   Narrative:  Start time: 05/01/2014 10:25 AM End time: 05/01/2014 10:30 AM Injection made incrementally with aspirations every 5 mL.  Performed by: Personally  Anesthesiologist: Kate Sable  Additional Notes: Pt accepts procedure w/ risks. 20cc 0.5% Marcaine w/epi w/odiscomfort or difficulty. GES   Procedure Name: Intubation Date/Time: 05/01/2014 10:52 AM Performed by: Rush Farmer E Pre-anesthesia Checklist: Patient identified, Emergency Drugs available, Suction available, Patient being monitored and Timeout performed Patient Re-evaluated:Patient Re-evaluated prior to inductionOxygen Delivery Method: Circle system utilized Preoxygenation: Pre-oxygenation with 100% oxygen Intubation Type: IV induction Ventilation: Mask ventilation without difficulty and Oral airway inserted - appropriate to patient size Laryngoscope Size: Mac and 3 Grade View: Grade I Tube type: Oral Tube size: 7.5 mm Number of attempts: 1 Airway Equipment and Method: Stylet Placement Confirmation: ETT inserted through vocal cords under direct vision,  positive ETCO2 and breath sounds checked- equal and bilateral Secured at: 22 cm Tube secured with: Tape Dental Injury: Teeth and  Oropharynx as per pre-operative assessment

## 2014-05-01 NOTE — Transfer of Care (Signed)
Immediate Anesthesia Transfer of Care Note  Patient: Kevin [name]  Procedure(s) Performed: Procedure(s): LEFT ARTHROSCOPY SHOULDER SUBACROMIAL DECOMPRESSION,DISTAL CLAVICAL RESECTION AND ROTATOR CUFF REPAIR (Left)  Patient Location: PACU  Anesthesia Type:GA combined with regional for post-op pain  Level of Consciousness: sedated  Airway & Oxygen Therapy: Patient Spontanous Breathing and Patient connected to nasal cannula oxygen  Post-op Assessment: Report given to PACU RN and Post -op Vital signs reviewed and stable  Post vital signs: Reviewed and stable  Complications: No apparent anesthesia complications

## 2014-05-01 NOTE — Discharge Instructions (Signed)
Kevin [name]. Supple, M.D., F.A.A.O.S. Orthopaedic Surgery Specializing in Arthroscopic and Reconstructive Surgery of the Shoulder and Knee [phone] [address] [place], [place] [address] - Fax [phone]  POST-OP SHOULDER ARTHROSCOPIC ROTATOR CUFF AND/OR LABRAL REPAIR INSTRUCTIONS  1. Call the office at [phone] to schedule your first post-op appointment 7-10 days from the date of your surgery.  2. Leave the steri-strips in place over your incisions when performing dressing changes and showering. You may remove your dressings and begin showering 72 hours from surgery. You can expect drainage that is clear to bloody in nature that occasionally will soak through your dressings. If this occurs go ahead and perform a dressing change. The drainage should lessen daily and when there is no drainage from your incisions feel free to go without a dressing.  3. Wear your sling/immobilizer at all times except to perform the exercises below or to occasionally let your arm dangle by your side to stretch your elbow. You also need to sleep in your sling immobilizer until instructed otherwise.  4. Range of motion to your elbow, wrist, and hand are encouraged 3-5 times daily. Exercise to your hand and fingers helps to reduce swelling you may experience.  5. Utilize ice to the shoulder 3-4 times minimum a day and additionally if you are experiencing pain.  6. You may one-armed drive when safely off of narcotics and muscle relaxants. You may use your hand that is in the sling to support the steering wheel only. However, should it be your right arm that is in the sling it is not to be used for gear shifting in a manual transmission.  7. If you had a block pre-operatively to provide post-op pain relief you may want to go ahead and begin utilizing your pain meds as your arm begins to wake up. Blocks can sometimes last up to 16-18 hours. If you are still pain-free prior to going to bed you may  want to strongly consider taking a pain medication to avoid being awakened in the night with the onset of pain. A muscle relaxant is also provided for you should you experience muscle spasms. It is recommended that if you are experiencing pain that your pain medication alone is not controlling, add the muscle relaxant along with the pain medication which can give additional pain relief. The first one to two days is generally the most severe of your pain and then should gradually decrease. As your pain lessens it is recommended that you decrease your use of the pain medications to an "as needed basis" only and to always comply with the recommended dosages of the pain medications.  8. Pain medications can produce constipation along with their use. If you experience this, the use of an over the counter stool softener or laxative daily is recommended.   9. For additional questions or concerns, please do not hesitate to call the office. If after hours there is an answering service to forward your concerns to the physician on call.  POST-OP EXERCISES  Pendulum Exercises  Perform pendulum exercises while standing and bending at the waist. Support your uninvolved arm on a table or chair and allow your operated arm to hang freely. Make sure to do these exercises passively - not using you shoulder muscle.  Repeat 20 times. Do 3 sessions per day.  What to eat:  For your first meals, you should eat lightly; only small meals initially.  If you do not have nausea, you may eat larger  meals.  Avoid spicy, greasy and heavy food.    General Anesthesia, Adult, Care After  Refer to this sheet in the next few weeks. These instructions provide you with information on caring for yourself after your procedure. Your health care provider may also give you more specific instructions. Your treatment has been planned according to current medical practices, but problems sometimes occur. Call your health care provider if you  have any problems or questions after your procedure.  WHAT TO EXPECT AFTER THE PROCEDURE  After the procedure, it is typical to experience:  Sleepiness.  Nausea and vomiting. HOME CARE INSTRUCTIONS  For the first 24 hours after general anesthesia:  Have a responsible person with you.  Do not drive a car. If you are alone, do not take public transportation.  Do not drink alcohol.  Do not take medicine that has not been prescribed by your health care provider.  Do not sign important papers or make important decisions.  You may resume a normal diet and activities as directed by your health care provider.  Change bandages (dressings) as directed.  If you have questions or problems that seem related to general anesthesia, call the hospital and ask for the anesthetist or anesthesiologist on call. SEEK MEDICAL CARE IF:  You have nausea and vomiting that continue the day after anesthesia.  You develop a rash. SEEK IMMEDIATE MEDICAL CARE IF:  You have difficulty breathing.  You have chest pain.  You have any allergic problems. Document Released: 08/22/2000 Document Revised: 01/16/2013 Document Reviewed: 11/29/2012  [facility] Patient Information 2014 [place], [place].

## 2014-05-01 NOTE — H&P (Signed)
Kevin [name]    Chief Complaint: LEFT SHOULDER IMPINGEMENT HPI: The patient is a 73 y.o. 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 Hyperlipidemia     takes Fenofibrate daily  . Peripheral vascular disease   . 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     . Arthritis     DDD- Lower Back  . Hypertension     takes Amlodipine,Lisinopril,and Ziac daily  . Muscle spasm     takes Zanaflex daily as needed  . Insomnia     takes Melatonin nightly  . Pneumonia     hx of   . TIA (transient ischemic attack)     x 2;takes Plavix daily  . Rheumatoid arthritis   . Back pain     stenosis  . Joint pain   . Joint swelling   . History of colon polyps   . Diverticulosis   . Urinary frequency     takes Flomax daily  . Urinary urgency   . History of blood transfusion     no abnormal reaction noted  . Cataracts, bilateral     immature  . Rheumatic fever     as a child    Past Surgical History  Procedure Laterality Date  . Abdominal aortic aneurysm repair  2010  . Carotid endarterectomy  09/20/2004    Left  CEA  . Tonsillectomy    . Colonoscopy    . Upper gi endoscopy    . Inguinal hernia repair Left 02/05/2013    Procedure: HERNIA REPAIR INGUINAL ADULT;  Surgeon: Joyice Faster. Cornett, MD;  Location: [facility];  Service: General;  Laterality: Left;  . Insertion of mesh Left 02/05/2013    Procedure: INSERTION OF MESH;  Surgeon: Joyice Faster. Cornett, MD;  Location: [facility];  Service: General;  Laterality: Left;  . Testiclar cyst excision    . Vasectomy    . Hand surgery Right     Family History  Problem Relation Age of Onset  . Parkinsonism Father   . Dementia Father   . Cancer Father     Throat  . Colon cancer Mother     died in 74s  . Cancer Mother     Colon    Social History:  reports that he has quit smoking. His  smoking use included Cigarettes. He has a 80 pack-year smoking history. He has never used smokeless tobacco. He reports that he uses illicit drugs (Marijuana). He reports that he does not drink alcohol.  Allergies: No Known Allergies  Medications Prior to Admission  Medication Sig Dispense Refill  . amLODipine (NORVASC) 10 MG tablet TAKE ONE TABLET BY MOUTH ONCE DAILY 30 tablet 0  . B Complex-C (B-COMPLEX WITH VITAMIN C) tablet Take 1 tablet by mouth daily.    . bisoprolol-hydrochlorothiazide (ZIAC) 10-6.25 MG per tablet Take 1 tablet by mouth daily. (Patient taking differently: Take 1 tablet by mouth daily after supper. ) 30 tablet 11  . Cholecalciferol (VITAMIN D-3 PO) Take 1 tablet by mouth daily.    . clopidogrel (PLAVIX) 75 MG tablet TAKE ONE TABLET BY MOUTH ONCE DAILY 90 tablet 0  . fenofibrate (TRICOR) 145 MG tablet TAKE ONE TABLET BY MOUTH ONCE DAILY 30 tablet 2  . ferrous sulfate 325 (65 FE) MG tablet Take 325 mg by mouth daily with breakfast.    .  gabapentin (NEURONTIN) 100 MG capsule Take 1 capsule (100 mg total) by mouth 3 (three) times daily. 90 capsule 1  . lisinopril (PRINIVIL,ZESTRIL) 5 MG tablet Take 0.5 tablets (2.5 mg total) by mouth daily. 1/2 a pill only until you see Dr. [name]. 90 tablet 3  . loratadine (CLARITIN) 10 MG tablet Take 10 mg by mouth daily as needed for allergies (congestion).    . Melatonin 3 MG TABS Take 3 mg by mouth at bedtime.     . Menthol-Zinc Oxide (GOLD BOND EX) Apply 1 application topically daily as needed (dry skin).    . Omega-3 Fatty Acids (FISH OIL) 1000 MG CAPS Take 1,000 mg by mouth daily.    Marland Kitchen oxyCODONE-acetaminophen (PERCOCET/ROXICET) 5-325 MG per tablet Take 0.5 tablets by mouth 3 (three) times daily as needed for moderate pain or severe pain (May fill 04/04/14). 45 tablet 0  . tamsulosin (FLOMAX) 0.4 MG CAPS capsule Take 1 capsule (0.4 mg total) by mouth daily. (Patient taking differently: Take 0.4 mg by mouth daily after supper. ) 90 capsule  3  . tiZANidine (ZANAFLEX) 4 MG tablet Take 4 mg by mouth every 6 (six) hours as needed for muscle spasms.    . Tapentadol HCl (NUCYNTA ER) 100 MG TB12 Take 1 tablet by mouth 2 (two) times daily. (Patient not taking: Reported on 04/18/2014) 60 tablet 0  . tiZANidine (ZANAFLEX) 4 MG tablet TAKE ONE TABLET BY MOUTH EVERY 6 HOURS AS NEEDED. (Patient not taking: Reported on 04/18/2014) 30 tablet 0     Physical Exam: left shoulder with painful and restricted motion as noted at recent office vistis  Vitals  Temp:  [98.5 F (36.9 C)] 98.5 F (36.9 C) (12/03 0821) Resp:  [18] 18 (12/03 0821) BP: (136)/(52) 136/52 mmHg (12/03 0821) SpO2:  [98 %] 98 % (12/03 0821)  Assessment/Plan  Impression: LEFT SHOULDER IMPINGEMENT  Plan of Action: Procedure(s): LEFT ARTHROSCOPY SHOULDER SUBACROMIAL DECOMPRESSION,DISTAL CLAVICAL RESECTION AND ROTATOR CUFF REPAIR  Kevin [name] 05/01/2014, 10:14 AM

## 2014-05-01 NOTE — Op Note (Signed)
Kevin [name], Kevin [name]                  ACCOUNT NO.:  [id number]  MEDICAL RECORD NO.:  [id number]  LOCATION:  [facility]                         FACILITY:  [facility]  PHYSICIAN:  Metta Clines. Taj Arteaga, M.D.  DATE OF BIRTH:  1940-12-29  DATE OF PROCEDURE:  05/01/2014 DATE OF DISCHARGE:  05/01/2014                              OPERATIVE REPORT   PREOPERATIVE DIAGNOSES: 1. Chronic left shoulder impingement syndrome. 2. 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 3. Left shoulder full-thickness rotator cuff tear.  POSTOPERATIVE DIAGNOSES: 1. Chronic left shoulder impingement syndrome. 2. 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 3. Left shoulder full-thickness rotator cuff tear. 4. Left shoulder degenerative labral tear.  PROCEDURES: 1. Left shoulder examination under anesthesia. 2. Left shoulder glenohumeral joint diagnostic arthroscopy. 3. Debridement of degenerative labral tear. 4. Arthroscopic subacromial decompression and bursectomy. 5. Arthroscopic distal clavicle resection. 6. Arthroscopic rotator cuff repair using a double-row SutureBridge     repair construct.  SURGEON:  Metta Clines. Ajanee Buren, M.D.  Terrence DupontOlivia Mackie A. Shuford, PA-C  ANESTHESIA:  LMA with a general endotracheal as well as an interscalene block.  ESTIMATED BLOOD LOSS:  Minimal.  DRAINS:  None.  HISTORY:  Mr. Grindstaff is a 72 year old gentleman who has had chronic, progressive increasing left shoulder pain with impingement syndrome and symptoms that have been refractory to prolonged attempts at conservative management.  His preoperative x-rays confirmed advanced AC joint arthrosis with an MRI scan showing rotator cuff tendinosis and a full- thickness rotator cuff tear as well as significant bony impingement. Due to his ongoing pain, functional limitations and failure to respond to conservative management, he was brought to the operating room at this time for planned left shoulder  arthroscopy as described below.  Preoperatively, I counseled Mr. Elvin on treatment options as well as risks versus benefits thereof.  Possible surgical complications were all reviewed with potential for bleeding, infection, neurovascular injury, persistent pain, loss of motion, anesthetic complication, and possible need for additional surgery.  He understand and accepts and agrees with the planned procedure.  PROCEDURE IN DETAIL:  After undergoing routine preoperative evaluation, the patient received prophylactic antibiotics.  An interscalene block was established in the holding area by the Anesthesia Department. Placed supine on the operative table and underwent smooth induction of an LMA and general anesthesia.  Turned to the right lateral decubitus position on a beanbag and appropriately padded and protected.  The left shoulder examination under anesthesia revealed full motion and no instability patterns were noted.  Left arm was then suspended at 70 degrees of abduction with 10 pounds of traction and left shoulder girdle region was then sterilely prepped and draped in standard fashion. Posterior portal was established in the glenohumeral joint and anterior portal was established under direct visualization.  The glenohumeral articular surfaces were all found to be in good condition.  No instability patterns were noted.  There was degenerative tearing of the superior labrum, which debrided with shaver, type 1 SLAP lesion.  Biceps anchor was stable.  Biceps in normal caliber, no distal instability. Rotator cuff showed obvious full-thickness defect involving the distal supraspinatus and this was debrided from the articular side with the shaver.  Remaining  inspection of the glenohumeral joint showed no obvious additional pathologies.  Fluid and instruments were then removed.  The arm was dropped down to 30 degrees of abduction with the arthroscope introduced into the subacromial space of  the posterior portal and direct lateral portal established in the subacromial space. Abundant dense bursal tissue and multiple adhesions were encountered and these were all divided and excised with a combination of shaver and Stryker wand.  The wand was then used to remove the periosteum from the undersurface of the anterior half of the acromion.  A subacromial decompression was performed with a bur creating a type 1 morphology and additionally, there was a separate ossicle of the anterior margin of the acromion, which we excised.  A portal was then established directly anterior to the distal clavicle and the distal clavicle resection was performed with a bur.  Care was taken to confirm visualization of entire circumference of the distal clavicle to ensure adequate removal of the bone.  We then completed the subacromial/subdeltoid bursectomy.  The rotator cuff tear was readily identified and the torn margin was markedly friable and was debrided with a shaver back to healthy tissue. We then prepared the greater tuberosity, removing residual soft tissue and gently abrading the bone to bleeding bed.  Accessory portal device was established and through the stab wound off lateral margin of the acromion, placed an Arthrex PEEK corkscrew suture anchor.  The limbs of the suture anchor was then passed through the free margin of the rotator cuff in a horizontal mattress pattern and then tied with sliding locking knots followed by multiple overhand throws and alternating posts.  We then created "suture bridge" with two SwiveLock suture anchors, which compressed the margin of the rotator cuff against the bony bed of tuberosity, and the overall construct was much to our satisfaction. Suture limbs were then clipped.  Bursectomy was completed.  Hemostasis was obtained.  Fluid and instruments were removed.  The portals were closed with Monocryl and Steri-Strips.  A dry dressing was taped to the left  shoulder.  Left arm was placed in a sling immobilizer.  The patient awakened, extubated, and take to the recovery room in stable condition.  Jenetta Loges, PA-C was used as an Environmental consultant throughout this case, essential for help with positioning of the patient, positioning of the extremity, management of the arthroscopic equipment, tissue manipulation, suture management, wound closure, and intraoperative decision making.     Metta Clines. Nare Gaspari, M.D.     KMS/MEDQ  D:  05/01/2014  T:  05/01/2014  Job:  [id number]

## 2014-05-02 ENCOUNTER — Encounter (HOSPITAL_COMMUNITY): Payer: Self-pay | Admitting: Orthopedic Surgery

## 2014-05-06 ENCOUNTER — Telehealth: Payer: Self-pay

## 2014-05-06 ENCOUNTER — Other Ambulatory Visit: Payer: Self-pay

## 2014-05-06 NOTE — Telephone Encounter (Signed)
Rx request for Zanaflex 4 mg tablet- Take 1 tablet by mouth every 6 hours as needed #30  Pharm:  [facility]   Pls advise.

## 2014-05-07 ENCOUNTER — Other Ambulatory Visit: Payer: Self-pay | Admitting: Family Medicine

## 2014-05-07 MED ORDER — TIZANIDINE HCL 4 MG PO TABS: 4.0000 mg | ORAL_TABLET | Freq: Four times a day (QID) | ORAL | Status: DC | PRN

## 2014-05-07 NOTE — Telephone Encounter (Signed)
Medication refilled

## 2014-05-07 NOTE — Telephone Encounter (Signed)
Is this ok to refill?  

## 2014-05-07 NOTE — Telephone Encounter (Signed)
Yes, thank you.

## 2014-05-16 ENCOUNTER — Other Ambulatory Visit: Payer: Self-pay | Admitting: Family Medicine

## 2014-05-21 ENCOUNTER — Other Ambulatory Visit: Payer: Self-pay | Admitting: *Deleted

## 2014-05-21 MED ORDER — TAMSULOSIN HCL 0.4 MG PO CAPS: 0.4000 mg | ORAL_CAPSULE | Freq: Every day | ORAL | Status: DC

## 2014-05-31 ENCOUNTER — Other Ambulatory Visit: Payer: Self-pay | Admitting: Family Medicine

## 2014-06-02 DIAGNOSIS — M75102 Unspecified rotator cuff tear or rupture of left shoulder, not specified as traumatic: Secondary | ICD-10-CM | POA: Diagnosis not present

## 2014-06-02 NOTE — Telephone Encounter (Signed)
Is this ok to refill?  

## 2014-06-02 NOTE — Telephone Encounter (Signed)
Yes with 5 refills

## 2014-06-05 DIAGNOSIS — M75102 Unspecified rotator cuff tear or rupture of left shoulder, not specified as traumatic: Secondary | ICD-10-CM | POA: Diagnosis not present

## 2014-06-08 ENCOUNTER — Other Ambulatory Visit: Payer: Self-pay | Admitting: Family Medicine

## 2014-06-09 DIAGNOSIS — M75102 Unspecified rotator cuff tear or rupture of left shoulder, not specified as traumatic: Secondary | ICD-10-CM | POA: Diagnosis not present

## 2014-06-12 DIAGNOSIS — M75102 Unspecified rotator cuff tear or rupture of left shoulder, not specified as traumatic: Secondary | ICD-10-CM | POA: Diagnosis not present

## 2014-06-16 ENCOUNTER — Other Ambulatory Visit: Payer: Self-pay | Admitting: Family Medicine

## 2014-06-16 DIAGNOSIS — M75102 Unspecified rotator cuff tear or rupture of left shoulder, not specified as traumatic: Secondary | ICD-10-CM | POA: Diagnosis not present

## 2014-06-18 DIAGNOSIS — M75102 Unspecified rotator cuff tear or rupture of left shoulder, not specified as traumatic: Secondary | ICD-10-CM | POA: Diagnosis not present

## 2014-06-25 DIAGNOSIS — M75102 Unspecified rotator cuff tear or rupture of left shoulder, not specified as traumatic: Secondary | ICD-10-CM | POA: Diagnosis not present

## 2014-06-27 DIAGNOSIS — M75102 Unspecified rotator cuff tear or rupture of left shoulder, not specified as traumatic: Secondary | ICD-10-CM | POA: Diagnosis not present

## 2014-07-01 DIAGNOSIS — M75102 Unspecified rotator cuff tear or rupture of left shoulder, not specified as traumatic: Secondary | ICD-10-CM | POA: Diagnosis not present

## 2014-07-04 DIAGNOSIS — M75102 Unspecified rotator cuff tear or rupture of left shoulder, not specified as traumatic: Secondary | ICD-10-CM | POA: Diagnosis not present

## 2014-07-08 DIAGNOSIS — M75102 Unspecified rotator cuff tear or rupture of left shoulder, not specified as traumatic: Secondary | ICD-10-CM | POA: Diagnosis not present

## 2014-07-13 ENCOUNTER — Other Ambulatory Visit: Payer: Self-pay | Admitting: Family Medicine

## 2014-07-15 DIAGNOSIS — M75102 Unspecified rotator cuff tear or rupture of left shoulder, not specified as traumatic: Secondary | ICD-10-CM | POA: Diagnosis not present

## 2014-07-17 DIAGNOSIS — M75102 Unspecified rotator cuff tear or rupture of left shoulder, not specified as traumatic: Secondary | ICD-10-CM | POA: Diagnosis not present

## 2014-07-22 DIAGNOSIS — M75102 Unspecified rotator cuff tear or rupture of left shoulder, not specified as traumatic: Secondary | ICD-10-CM | POA: Diagnosis not present

## 2014-07-24 ENCOUNTER — Other Ambulatory Visit (HOSPITAL_COMMUNITY): Payer: Medicare Other

## 2014-07-24 ENCOUNTER — Encounter (HOSPITAL_COMMUNITY): Payer: Medicare Other

## 2014-07-24 ENCOUNTER — Ambulatory Visit: Payer: Medicare Other | Admitting: Family

## 2014-07-24 DIAGNOSIS — M75102 Unspecified rotator cuff tear or rupture of left shoulder, not specified as traumatic: Secondary | ICD-10-CM | POA: Diagnosis not present

## 2014-08-23 ENCOUNTER — Other Ambulatory Visit: Payer: Self-pay | Admitting: Family Medicine

## 2014-09-10 ENCOUNTER — Encounter: Payer: Self-pay | Admitting: Family

## 2014-09-11 ENCOUNTER — Ambulatory Visit (INDEPENDENT_AMBULATORY_CARE_PROVIDER_SITE_OTHER)
Admission: RE | Admit: 2014-09-11 | Discharge: 2014-09-11 | Disposition: A | Discharge status: Home / Self Care | Payer: Medicare Other | Source: Ambulatory Visit | Attending: Family | Admitting: Family

## 2014-09-11 ENCOUNTER — Encounter: Payer: Self-pay | Admitting: Family

## 2014-09-11 ENCOUNTER — Ambulatory Visit (INDEPENDENT_AMBULATORY_CARE_PROVIDER_SITE_OTHER): Payer: Medicare Other | Admitting: Family

## 2014-09-11 ENCOUNTER — Ambulatory Visit (HOSPITAL_COMMUNITY)
Admission: RE | Admit: 2014-09-11 | Discharge: 2014-09-11 | Disposition: A | Discharge status: Home / Self Care | Payer: Medicare Other | Source: Ambulatory Visit | Attending: Family | Admitting: Family

## 2014-09-11 VITALS — BP 121/60 | HR 44 | Resp 14 | Ht 69.0 in | Wt 196.0 lb

## 2014-09-11 DIAGNOSIS — Z9889 Other specified postprocedural states: Principal | ICD-10-CM

## 2014-09-11 DIAGNOSIS — Z8679 Personal history of other diseases of the circulatory system: Secondary | ICD-10-CM

## 2014-09-11 DIAGNOSIS — I6523 Occlusion and stenosis of bilateral carotid arteries: Secondary | ICD-10-CM | POA: Diagnosis not present

## 2014-09-11 DIAGNOSIS — Z87891 Personal history of nicotine dependence: Secondary | ICD-10-CM

## 2014-09-11 DIAGNOSIS — I739 Peripheral vascular disease, unspecified: Secondary | ICD-10-CM

## 2014-09-11 DIAGNOSIS — Z48812 Encounter for surgical aftercare following surgery on the circulatory system: Secondary | ICD-10-CM

## 2014-09-11 NOTE — Patient Instructions (Signed)

## 2014-09-11 NOTE — Progress Notes (Signed)
[facility] [place] HISTORY AND PHYSICAL   MRN : [id number]  History of Present Illness:   Kevin [name] is a 74 y.o. male patient of Dr. Oneida Alar who presents for routine follow up s/p OAR (2010, Dr. Oneida Alar) and left CEA (2006).  The patient has had left lower quadrant chronic abdominal pain that he attributes to an inguinal hernia, states Dr. Arnoldo Morale aware.  Has chronic intermittent right hip pain and low back pain, aggravated by certain activities; his hip pain, this was evaluated by Dr. Ricard Dillon, pt states he was told that his hip is fine but his lumbar spine has issues. He states pain management has not returned his phone call x2, has not been able to attend yet, is becoming discouraged with them. Had TIA in 2013: mild left facial and left leg tingling that lasted about 30 minutes, was evaluated at [facility] ED. He denies any further TIA activity.  He denies non healing wounds. 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  He walks about a total of 3-4 hours/day. His walking is limited by his low back pain, but still is able to mow his lawn, has to stop every 10 minutes.  He had left shoulder tendon repair December 2015, shoulder feel better.  Pt Diabetic: No Pt smoker: former smoker, quit in 1989  Pt meds include: Statin :Yes Betablocker: Yes ASA: No Other anticoagulants/antiplatelets: Plavix     Current Outpatient Prescriptions  Medication Sig Dispense Refill  . amLODipine (NORVASC) 10 MG tablet TAKE ONE TABLET BY MOUTH ONCE DAILY 30 tablet 3  . B Complex-C (B-COMPLEX WITH VITAMIN C) tablet Take 1 tablet by mouth daily.    . bisoprolol-hydrochlorothiazide (ZIAC) 10-6.25 MG per tablet Take 1 tablet by mouth daily. (Patient taking differently: Take 1 tablet by mouth daily after supper. ) 30 tablet 11  . Cholecalciferol (VITAMIN D-3 PO) Take 1 tablet by mouth daily.    . clopidogrel (PLAVIX) 75 MG  tablet TAKE ONE TABLET BY MOUTH ONCE DAILY 90 tablet 1  . diazepam (VALIUM) 5 MG tablet Take 0.5-1 tablets (2.5-5 mg total) by mouth every 6 (six) hours as needed for muscle spasms or sedation. 40 tablet 1  . fenofibrate (TRICOR) 145 MG tablet TAKE ONE TABLET BY MOUTH ONCE DAILY 30 tablet 3  . ferrous sulfate 325 (65 FE) MG tablet Take 325 mg by mouth daily with breakfast.    . gabapentin (NEURONTIN) 100 MG capsule TAKE ONE CAPSULE BY MOUTH THREE TIMES DAILY 90 capsule 2  . HYDROmorphone (DILAUDID) 2 MG tablet Take 1-2 tablets (2-4 mg total) by mouth every 4 (four) hours as needed for severe pain. (Patient not taking: Reported on 09/11/2014) 60 tablet 0  . lisinopril (PRINIVIL,ZESTRIL) 5 MG tablet Take 0.5 tablets (2.5 mg total) by mouth daily. 1/2 a pill only until you see Dr. [name]. 90 tablet 3  . loratadine (CLARITIN) 10 MG tablet Take 10 mg by mouth daily as needed for allergies (congestion).    . Melatonin 3 MG TABS Take 3 mg by mouth at bedtime.     . Menthol-Zinc Oxide (GOLD BOND EX) Apply 1 application topically daily as needed (dry skin).    Gean Birchwood ER 100 MG TB12     . Omega-3 Fatty Acids (FISH OIL) 1000 MG CAPS Take 1,000 mg by mouth daily.    Marland Kitchen oxyCODONE-acetaminophen (PERCOCET/ROXICET) 5-325 MG per tablet     .  tamsulosin (FLOMAX) 0.4 MG CAPS capsule Take 1 capsule (0.4 mg total) by mouth daily. 90 capsule 3  . tiZANidine (ZANAFLEX) 4 MG tablet TAKE ONE TABLET BY MOUTH EVERY 6 HOURS AS NEEDED FOR MUSCLE SPASM 30 tablet 5   No current facility-administered medications for this visit.    Past Medical History  Diagnosis Date  . Hyperlipidemia     takes Fenofibrate daily  . Peripheral vascular disease   . 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     . Arthritis     DDD- Lower Back  . Hypertension     takes Amlodipine,Lisinopril,and Ziac daily  . Muscle spasm     takes Zanaflex daily as needed  . Insomnia     takes Melatonin nightly  . Pneumonia      hx of   . TIA (transient ischemic attack)     x 2;takes Plavix daily  . Rheumatoid arthritis   . Back pain     stenosis  . Joint pain   . Joint swelling   . History of colon polyps   . Diverticulosis   . Urinary frequency     takes Flomax daily  . Urinary urgency   . History of blood transfusion     no abnormal reaction noted  . Cataracts, bilateral     immature  . Rheumatic fever     as a child    Social History History  Substance Use Topics  . Smoking status: Former Smoker -- 2.00 packs/day for 40 years    Types: Cigarettes  . Smokeless tobacco: Never Used     Comment: quit smoking 25+yrs ago  . Alcohol Use: No    Family History Family History  Problem Relation Age of Onset  . Parkinsonism Father   . Dementia Father   . Cancer Father     Throat  . Colon cancer Mother     died in 66s  . Cancer Mother     Colon    Surgical History Past Surgical History  Procedure Laterality Date  . Abdominal aortic aneurysm repair  2010  . Carotid endarterectomy  09/20/2004    Left  CEA  . Tonsillectomy    . Colonoscopy    . Upper gi endoscopy    . Inguinal hernia repair Left 02/05/2013    Procedure: HERNIA REPAIR INGUINAL ADULT;  Surgeon: Joyice Faster. Cornett, MD;  Location: [facility];  Service: General;  Laterality: Left;  . Insertion of mesh Left 02/05/2013    Procedure: INSERTION OF MESH;  Surgeon: Joyice Faster. Cornett, MD;  Location: [facility];  Service: General;  Laterality: Left;  . Testiclar cyst excision    . Vasectomy    . Hand surgery Right   . Shoulder arthroscopy with rotator cuff repair and subacromial decompression Left 05/01/2014    Procedure: LEFT ARTHROSCOPY SHOULDER SUBACROMIAL DECOMPRESSION,DISTAL CLAVICAL RESECTION AND ROTATOR CUFF REPAIR;  Surgeon: Marin Shutter, MD;  Location: [facility];  Service: Orthopedics;  Laterality: Left;    No Known Allergies  Current Outpatient Prescriptions  Medication Sig Dispense Refill  .  amLODipine (NORVASC) 10 MG tablet TAKE ONE TABLET BY MOUTH ONCE DAILY 30 tablet 3  . B Complex-C (B-COMPLEX WITH VITAMIN C) tablet Take 1 tablet by mouth daily.    . bisoprolol-hydrochlorothiazide (ZIAC) 10-6.25 MG per tablet Take 1 tablet by mouth daily. (Patient taking differently: Take 1 tablet by mouth daily after supper. ) 30 tablet 11  .  Cholecalciferol (VITAMIN D-3 PO) Take 1 tablet by mouth daily.    . clopidogrel (PLAVIX) 75 MG tablet TAKE ONE TABLET BY MOUTH ONCE DAILY 90 tablet 1  . diazepam (VALIUM) 5 MG tablet Take 0.5-1 tablets (2.5-5 mg total) by mouth every 6 (six) hours as needed for muscle spasms or sedation. 40 tablet 1  . fenofibrate (TRICOR) 145 MG tablet TAKE ONE TABLET BY MOUTH ONCE DAILY 30 tablet 3  . ferrous sulfate 325 (65 FE) MG tablet Take 325 mg by mouth daily with breakfast.    . gabapentin (NEURONTIN) 100 MG capsule TAKE ONE CAPSULE BY MOUTH THREE TIMES DAILY 90 capsule 2  . HYDROmorphone (DILAUDID) 2 MG tablet Take 1-2 tablets (2-4 mg total) by mouth every 4 (four) hours as needed for severe pain. (Patient not taking: Reported on 09/11/2014) 60 tablet 0  . lisinopril (PRINIVIL,ZESTRIL) 5 MG tablet Take 0.5 tablets (2.5 mg total) by mouth daily. 1/2 a pill only until you see Dr. [name]. 90 tablet 3  . loratadine (CLARITIN) 10 MG tablet Take 10 mg by mouth daily as needed for allergies (congestion).    . Melatonin 3 MG TABS Take 3 mg by mouth at bedtime.     . Menthol-Zinc Oxide (GOLD BOND EX) Apply 1 application topically daily as needed (dry skin).    Gean Birchwood ER 100 MG TB12     . Omega-3 Fatty Acids (FISH OIL) 1000 MG CAPS Take 1,000 mg by mouth daily.    Marland Kitchen oxyCODONE-acetaminophen (PERCOCET/ROXICET) 5-325 MG per tablet     . tamsulosin (FLOMAX) 0.4 MG CAPS capsule Take 1 capsule (0.4 mg total) by mouth daily. 90 capsule 3  . tiZANidine (ZANAFLEX) 4 MG tablet TAKE ONE TABLET BY MOUTH EVERY 6 HOURS AS NEEDED FOR MUSCLE SPASM 30 tablet 5   No current  facility-administered medications for this visit.     REVIEW OF SYSTEMS: See HPI for pertinent positives and negatives.  Physical Examination Filed Vitals:   09/11/14 1151 09/11/14 1153  BP: 146/73 135/67  Pulse: 47 45  Resp:  14  Height:  5\' 9"  (1.753 m)  Weight:  196 lb (88.905 kg)  SpO2:  99%   Body mass index is 28.93 kg/(m^2).  General: A&O x 3, WD.  Pulmonary: Sym exp, good air movt, CTAB, no rales, rhonchi, & wheezing.  Cardiac: RRR, Nl S1, S2, no detected murmurs   Vascular:  Vessel  Right  Left   Radial  2+Palpable  2+Palpable   Carotid  audible, without bruit  audible, without bruit   Aorta  Not palpable  N/A   Femoral  1+Palpable  2+Palpable   Popliteal  Not palpable  Not palpable   PT  Biphasic by Doppler Biphasic by Doppler  DP  1+ palpable,Biphasic by Doppler Monophasic by Doppler   Gastrointestinal: soft, NTND, -G/R, - HSM, - palpable masses, - CVAT B.  Musculoskeletal: M/S 5/5 throughout, Extremities without ischemic changes.  Neurologic: Pain and light touch intact in extremities, Motor exam as listed above. CN 2-12 intact.         Non-Invasive Vascular Imaging (09/11/2014):  CEREBROVASCULAR DUPLEX EVALUATION    INDICATION: Carotid artery disease    PREVIOUS INTERVENTION(S): Left carotid endarterectomy 2006    DUPLEX EXAM: Carotid duplex    RIGHT  LEFT  Peak Systolic Velocities (cm/s) End Diastolic Velocities (cm/s) Plaque LOCATION Peak Systolic Velocities (cm/s) End Diastolic Velocities (cm/s) Plaque  73 11  CCA PROXIMAL 53 13   30 7  HT CCA  MID 55 14 HT  29 10 HT CCA DISTAL 66 18 HT  45 5 HT ECA 80 8 HT  173 59 HT ICA PROXIMAL 54 21 HT  61 22  ICA MID 46 17   57 23  ICA DISTAL 54 21     5.90 ICA / CCA Ratio (PSV)   Antegrade Vertebral Flow Antegrade  123456 Brachial Systolic Pressure (mmHg) 0000000  Triphasic Brachial Artery Waveforms Triphasic    Plaque Morphology:  HM = Homogeneous, HT = Heterogeneous, CP =  Calcific Plaque, SP = Smooth Plaque, IP = Irregular Plaque  ADDITIONAL FINDINGS:     IMPRESSION: 1. 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 2. Patent left carotid endarterectomy site with no evidence for restenosis.    Compared to the previous exam:  No significant change since exam of 01/23/2014.    ABI (Date: 09/11/2014)  R: 0.70 (01/23/14, 0.74), DP: biphasic, PT: biphasic, TBI: 0.38  L: 0.76 (0.64), DP: monophasic, PT: biphasic, TBI: 0.49      ASSESSMENT:  Kevin [name] is a 74 y.o. male who is s/p OAR (2010, Dr. Oneida Alar) and left CEA (2006).  He has had no TIA since 2013, has no claudication, no tissue loss. His walking is limited by his chronic low back pain, but still is able to mow his lawn, has to stop every 10 minutes. Today's carotid Duplex suggests a 40 - 59% right internal carotid artery stenosis, patent left carotid endarterectomy site with no evidence for restenosis. No significant change since exam of 01/23/2014. ABI's: right leg remains stable with moderate arterial occlusive disease, left leg improved indicating moderate arterial occlusive disease.  Face to face time with patient was 25 minutes. Over 50% of this time was spent on counseling and coordination of care.    PLAN:   Based on today's exam and non-invasive vascular lab results, the patient will follow up in 1 year with the following tests ABI's and carotid Duplex. He knows to call sooner for worsening pain in the muscles of his legs with walking or non healing wounds. I discussed in depth with the patient the nature of atherosclerosis, and emphasized the importance of maximal medical management including strict control of blood pressure, blood glucose, and lipid levels, obtaining regular exercise, and continued cessation of smoking.  The patient is aware that without maximal medical management the underlying atherosclerotic disease process will progress, limiting the benefit of any  interventions.  The patient was given information about stroke prevention and what symptoms should prompt the patient to seek immediate medical care.  The patient was given information about PAD including signs, symptoms, treatment, what symptoms should prompt the patient to seek immediate medical care, and risk reduction measures to take. Thank you for allowing [place] to participate in this patient's care.  Clemon Chambers, RN, MSN, FNP-C [facility] Office: [phone]  Clinic MD: Oneida Alar 09/11/2014 12:01 PM

## 2014-09-11 NOTE — Progress Notes (Signed)
Filed Vitals:   09/11/14 1151 09/11/14 1153 09/11/14 1159  BP: 146/73 135/67 121/60  Pulse: 47 45 44  Resp:  14   Height:  5\' 9"  (1.753 m)   Weight:  196 lb (88.905 kg)   SpO2:  99%

## 2014-09-22 ENCOUNTER — Encounter: Payer: Self-pay | Admitting: Family Medicine

## 2014-09-22 ENCOUNTER — Ambulatory Visit (INDEPENDENT_AMBULATORY_CARE_PROVIDER_SITE_OTHER): Payer: Medicare Other | Admitting: Family Medicine

## 2014-09-22 VITALS — BP 144/78 | HR 51 | Temp 98.0°F | Wt 197.0 lb

## 2014-09-22 DIAGNOSIS — N183 Chronic kidney disease, stage 3 unspecified: Secondary | ICD-10-CM

## 2014-09-22 DIAGNOSIS — I10 Essential (primary) hypertension: Secondary | ICD-10-CM

## 2014-09-22 DIAGNOSIS — E785 Hyperlipidemia, unspecified: Secondary | ICD-10-CM

## 2014-09-22 MED ORDER — ATORVASTATIN CALCIUM 20 MG PO TABS: 20.0000 mg | ORAL_TABLET | Freq: Every day | ORAL | Status: DC

## 2014-09-22 NOTE — Assessment & Plan Note (Signed)
Poor control on last check in 2014 with trig >1000 and LDL also elevated. Had been on lipitor at one point. Some risk increased myalgias but with previous #s believe important to restart. Continue fenofibrate and restart lipitor and check in 6 weeks.

## 2014-09-22 NOTE — Patient Instructions (Addendum)
See each other back in 6 weeks  Start atorvastatin 20mg  daily. This can cause muscle cramps so if you have an increase in cramping let me know, I may try every other day.   Increase lisinopril to full tab  See me back in 6 weeks for a morning appointment. Come in fasting.

## 2014-09-22 NOTE — Assessment & Plan Note (Signed)
Need tighter BP and lipid control. See HTN and HLD sections. Plan on recheck bmet in 6 weeks.

## 2014-09-22 NOTE — Progress Notes (Signed)
Garret Reddish, MD Phone: [phone]  Subjective:   Kevin [name] is a 74 y.o. year old very pleasant male patient who presents with the following:  Hypertension-mild poor control CKD Stage III-stable over last year BP Readings from Last 3 Encounters:  09/22/14 144/78  09/11/14 121/60  05/01/14 136/44   Home BP monitoring-no Compliant with medications-yes without side effects ROS-Denies any CP, HA, SOB, blurry vision, LE edema.  Hyperlipidemia-poor control on last check Lab Results  Component Value Date   CHOL 625* 12/26/2012   HDL 45.00 12/26/2012   LDLCALC 80 03/12/2012   LDLDIRECT 134.9 12/26/2012   TRIG * 12/26/2012    1069.0 Triglyceride is over 400; calculations on Lipids are invalid.   CHOLHDL 14 12/26/2012   ROS-as above Past Medical History- Patient Active Problem List   Diagnosis Date Noted  . PVD (peripheral vascular disease) 01/23/2014    Priority: High  . AAA (abdominal aortic aneurysm, ruptured) 01/17/2013    Priority: High  . CVA (cerebral infarction) 11/17/2011    Priority: High  . Chronic pain syndrome 12/23/2009    Priority: High  . CKD (chronic kidney disease), stage III 01/27/2014    Priority: Medium  . Personal history of colonic polyps 05/15/2013    Priority: Medium  . Carotid Artery Stenosis L s/p endarterectomy 01/12/2012    Priority: Medium  . TREMOR, ESSENTIAL 10/05/2009    Priority: Medium  . UNSPECIFIED ANEMIA 12/05/2008    Priority: Medium  . BPH (benign prostatic hyperplasia) 06/04/2007    Priority: Medium  . Fasting hyperglycemia 02/05/2007    Priority: Medium  . Hyperlipidemia 01/03/2007    Priority: Medium  . Essential hypertension 01/03/2007    Priority: Medium  . Family history of malignant neoplasm of gastrointestinal tract 05/15/2013    Priority: Low  . Inguinal hernia 05/01/2012    Priority: Low  . BURSITIS, HIP 12/21/2009    Priority: Low  . ACTINIC KERATOSIS, EAR, LEFT 03/09/2009    Priority: Low  . Left knee  Osteoarthritis 11/02/2007    Priority: Low  . CONDUCTIVE HEARING LOSS BILATERAL 06/04/2007    Priority: Low   Medications- reviewed and updated Current Outpatient Prescriptions  Medication Sig Dispense Refill  . amLODipine (NORVASC) 10 MG tablet TAKE ONE TABLET BY MOUTH ONCE DAILY 30 tablet 3  . B Complex-C (B-COMPLEX WITH VITAMIN C) tablet Take 1 tablet by mouth daily.    . bisoprolol-hydrochlorothiazide (ZIAC) 10-6.25 MG per tablet Take 1 tablet by mouth daily. (Patient taking differently: Take 1 tablet by mouth daily after supper. ) 30 tablet 11  . Cholecalciferol (VITAMIN D-3 PO) Take 1 tablet by mouth daily.    . clopidogrel (PLAVIX) 75 MG tablet TAKE ONE TABLET BY MOUTH ONCE DAILY 90 tablet 1  . fenofibrate (TRICOR) 145 MG tablet TAKE ONE TABLET BY MOUTH ONCE DAILY 30 tablet 3  . ferrous sulfate 325 (65 FE) MG tablet Take 325 mg by mouth daily with breakfast.    . gabapentin (NEURONTIN) 100 MG capsule TAKE ONE CAPSULE BY MOUTH THREE TIMES DAILY 90 capsule 2  . lisinopril (PRINIVIL,ZESTRIL) 5 MG tablet Take 0.5 tablets (2.5 mg total) by mouth daily. 1/2 a pill only until you see Dr. [name]. 90 tablet 3  . loratadine (CLARITIN) 10 MG tablet Take 10 mg by mouth daily as needed for allergies (congestion).    . Melatonin 3 MG TABS Take 3 mg by mouth at bedtime.     . Menthol-Zinc Oxide (GOLD BOND EX) Apply 1 application  topically daily as needed (dry skin).    . tamsulosin (FLOMAX) 0.4 MG CAPS capsule Take 1 capsule (0.4 mg total) by mouth daily. 90 capsule 3  . tiZANidine (ZANAFLEX) 4 MG tablet TAKE ONE TABLET BY MOUTH EVERY 6 HOURS AS NEEDED FOR MUSCLE SPASM 30 tablet 5  . diazepam (VALIUM) 5 MG tablet Take 0.5-1 tablets (2.5-5 mg total) by mouth every 6 (six) hours as needed for muscle spasms or sedation. (Patient not taking: Reported on 09/22/2014) 40 tablet 1  . oxyCODONE-acetaminophen (PERCOCET/ROXICET) 5-325 MG per tablet      No current facility-administered medications for this  visit.    Objective: BP 144/78 mmHg  Pulse 51  Temp(Src) 98 F (36.7 C)  Wt 197 lb (89.359 kg) Gen: NAD, resting comfortably CV: RRR no murmurs rubs or gallops Lungs: CTAB no crackles, wheeze, rhonchi Abdomen: soft/nontender/nondistended/normal bowel sounds. Ext: no edema No calf pain with compression. No unilateral leg swelling or bilateral leg swelling Skin: warm, dry, no rash over legs   Assessment/Plan:  Hyperlipidemia Poor control on last check in 2014 with trig >1000 and LDL also elevated. Had been on lipitor at one point. Some risk increased myalgias but with previous #s believe important to restart. Continue fenofibrate and restart lipitor and check in 6 weeks.    Essential hypertension With CKD and CVA and AAA history needs tighter control. Increase lisinopril to 5mg  (full pill up from half pill) and continue Bisoprolol-hctz max + amlodipine 10mg , lisinopril 5mg    CKD (chronic kidney disease), stage III Need tighter BP and lipid control. See HTN and HLD sections. Plan on recheck bmet in 6 weeks.    S: Had a few episodes within the last 3 weeks but none in last week of cramping in calves, R>L. Saw vascular 09/11/14 and PAD largely stable. Patient denies it being with walking but just occurred with sitting on most occasions. No calf swelling or calf pain outside the flares of pain.  A/P: just evaluated by vascular. Given intermittent nature not related to activity, doubt this is vascular in nature. No episodes in a week. We discussed return precautions. Know there is some risk of increased cramps on atorvastatin. Could consider Ck level  6 week follow up HLD- lipid panel Hyperglycemia-a1c HTN- cbc, cmet  Meds ordered this encounter  Medications  . atorvastatin (LIPITOR) 20 MG tablet    Sig: Take 1 tablet (20 mg total) by mouth daily.    Dispense:  30 tablet    Refill:  5

## 2014-09-22 NOTE — Assessment & Plan Note (Signed)
With CKD and CVA and AAA history needs tighter control. Increase lisinopril to 5mg  (full pill up from half pill) and continue Bisoprolol-hctz max + amlodipine 10mg , lisinopril 5mg 

## 2014-10-08 DIAGNOSIS — M488X6 Other specified spondylopathies, lumbar region: Secondary | ICD-10-CM | POA: Diagnosis not present

## 2014-10-08 DIAGNOSIS — M4696 Unspecified inflammatory spondylopathy, lumbar region: Secondary | ICD-10-CM | POA: Diagnosis not present

## 2014-10-08 DIAGNOSIS — M5137 Other intervertebral disc degeneration, lumbosacral region: Secondary | ICD-10-CM | POA: Diagnosis not present

## 2014-10-08 DIAGNOSIS — M47817 Spondylosis without myelopathy or radiculopathy, lumbosacral region: Secondary | ICD-10-CM | POA: Diagnosis not present

## 2014-10-08 DIAGNOSIS — G894 Chronic pain syndrome: Secondary | ICD-10-CM | POA: Diagnosis not present

## 2014-10-15 DIAGNOSIS — M5137 Other intervertebral disc degeneration, lumbosacral region: Secondary | ICD-10-CM | POA: Diagnosis not present

## 2014-10-15 DIAGNOSIS — M47817 Spondylosis without myelopathy or radiculopathy, lumbosacral region: Secondary | ICD-10-CM | POA: Diagnosis not present

## 2014-10-15 DIAGNOSIS — M488X6 Other specified spondylopathies, lumbar region: Secondary | ICD-10-CM | POA: Diagnosis not present

## 2014-10-18 ENCOUNTER — Emergency Department (HOSPITAL_COMMUNITY): Payer: Medicare Other

## 2014-10-18 ENCOUNTER — Encounter (HOSPITAL_COMMUNITY): Payer: Self-pay | Admitting: Emergency Medicine

## 2014-10-18 ENCOUNTER — Other Ambulatory Visit (HOSPITAL_COMMUNITY): Payer: Self-pay

## 2014-10-18 ENCOUNTER — Emergency Department (HOSPITAL_COMMUNITY)
Admission: EM | Admit: 2014-10-18 | Discharge: 2014-10-18 | Disposition: A | Discharge status: Home / Self Care | Payer: Medicare Other | Attending: Emergency Medicine | Admitting: Emergency Medicine

## 2014-10-18 DIAGNOSIS — Z8719 Personal history of other diseases of the digestive system: Secondary | ICD-10-CM | POA: Diagnosis not present

## 2014-10-18 DIAGNOSIS — Z87891 Personal history of nicotine dependence: Secondary | ICD-10-CM | POA: Diagnosis not present

## 2014-10-18 DIAGNOSIS — Z86010 Personal history of colonic polyps: Secondary | ICD-10-CM | POA: Insufficient documentation

## 2014-10-18 DIAGNOSIS — I10 Essential (primary) hypertension: Secondary | ICD-10-CM | POA: Insufficient documentation

## 2014-10-18 DIAGNOSIS — R079 Chest pain, unspecified: Secondary | ICD-10-CM | POA: Diagnosis present

## 2014-10-18 DIAGNOSIS — Z8673 Personal history of transient ischemic attack (TIA), and cerebral infarction without residual deficits: Secondary | ICD-10-CM

## 2014-10-18 DIAGNOSIS — R5383 Other fatigue: Secondary | ICD-10-CM | POA: Insufficient documentation

## 2014-10-18 DIAGNOSIS — Z8701 Personal history of pneumonia (recurrent): Secondary | ICD-10-CM | POA: Insufficient documentation

## 2014-10-18 DIAGNOSIS — Z79899 Other long term (current) drug therapy: Secondary | ICD-10-CM | POA: Insufficient documentation

## 2014-10-18 DIAGNOSIS — E785 Hyperlipidemia, unspecified: Secondary | ICD-10-CM | POA: Insufficient documentation

## 2014-10-18 DIAGNOSIS — H269 Unspecified cataract: Secondary | ICD-10-CM | POA: Insufficient documentation

## 2014-10-18 DIAGNOSIS — G4700 Insomnia, unspecified: Secondary | ICD-10-CM | POA: Diagnosis not present

## 2014-10-18 DIAGNOSIS — R0789 Other chest pain: Secondary | ICD-10-CM | POA: Diagnosis not present

## 2014-10-18 DIAGNOSIS — R4182 Altered mental status, unspecified: Secondary | ICD-10-CM | POA: Diagnosis not present

## 2014-10-18 DIAGNOSIS — R410 Disorientation, unspecified: Secondary | ICD-10-CM | POA: Diagnosis not present

## 2014-10-18 DIAGNOSIS — I638 Other cerebral infarction: Secondary | ICD-10-CM | POA: Diagnosis not present

## 2014-10-18 DIAGNOSIS — I639 Cerebral infarction, unspecified: Secondary | ICD-10-CM | POA: Insufficient documentation

## 2014-10-18 DIAGNOSIS — R030 Elevated blood-pressure reading, without diagnosis of hypertension: Secondary | ICD-10-CM | POA: Diagnosis not present

## 2014-10-18 DIAGNOSIS — M069 Rheumatoid arthritis, unspecified: Secondary | ICD-10-CM | POA: Insufficient documentation

## 2014-10-18 DIAGNOSIS — Z7902 Long term (current) use of antithrombotics/antiplatelets: Secondary | ICD-10-CM | POA: Insufficient documentation

## 2014-10-18 DIAGNOSIS — R001 Bradycardia, unspecified: Secondary | ICD-10-CM | POA: Diagnosis not present

## 2014-10-18 HISTORY — DX: Bradycardia, unspecified: R00.1

## 2014-10-18 LAB — URINALYSIS, ROUTINE W REFLEX MICROSCOPIC
Bilirubin Urine: NEGATIVE
GLUCOSE, UA: NEGATIVE mg/dL
Hgb urine dipstick: NEGATIVE
KETONES UR: NEGATIVE mg/dL
Leukocytes, UA: NEGATIVE
NITRITE: NEGATIVE
Protein, ur: NEGATIVE mg/dL
Specific Gravity, Urine: 1.013 (ref 1.005–1.030)
UROBILINOGEN UA: 0.2 mg/dL (ref 0.0–1.0)
pH: 5.5 (ref 5.0–8.0)

## 2014-10-18 LAB — CBC WITH DIFFERENTIAL/PLATELET
Basophils Absolute: 0 10*3/uL (ref 0.0–0.1)
Basophils Relative: 1 % (ref 0–1)
EOS PCT: 6 % — AB (ref 0–5)
Eosinophils Absolute: 0.4 10*3/uL (ref 0.0–0.7)
HEMATOCRIT: 32.9 % — AB (ref 39.0–52.0)
HEMOGLOBIN: 11.2 g/dL — AB (ref 13.0–17.0)
Lymphocytes Relative: 21 % (ref 12–46)
Lymphs Abs: 1.2 10*3/uL (ref 0.7–4.0)
MCH: 29.9 pg (ref 26.0–34.0)
MCHC: 34 g/dL (ref 30.0–36.0)
MCV: 88 fL (ref 78.0–100.0)
MONO ABS: 0.5 10*3/uL (ref 0.1–1.0)
Monocytes Relative: 9 % (ref 3–12)
NEUTROS PCT: 63 % (ref 43–77)
Neutro Abs: 3.7 10*3/uL (ref 1.7–7.7)
Platelets: 214 10*3/uL (ref 150–400)
RBC: 3.74 MIL/uL — AB (ref 4.22–5.81)
RDW: 13.3 % (ref 11.5–15.5)
WBC: 5.8 10*3/uL (ref 4.0–10.5)

## 2014-10-18 LAB — I-STAT TROPONIN, ED: Troponin i, poc: 0 ng/mL (ref 0.00–0.08)

## 2014-10-18 LAB — COMPREHENSIVE METABOLIC PANEL
ALBUMIN: 3.7 g/dL (ref 3.5–5.0)
ALT: 21 U/L (ref 17–63)
ANION GAP: 10 (ref 5–15)
AST: 27 U/L (ref 15–41)
Alkaline Phosphatase: 26 U/L — ABNORMAL LOW (ref 38–126)
BILIRUBIN TOTAL: 0.3 mg/dL (ref 0.3–1.2)
BUN: 30 mg/dL — ABNORMAL HIGH (ref 6–20)
CHLORIDE: 106 mmol/L (ref 101–111)
CO2: 22 mmol/L (ref 22–32)
Calcium: 8.9 mg/dL (ref 8.9–10.3)
Creatinine, Ser: 2.22 mg/dL — ABNORMAL HIGH (ref 0.61–1.24)
GFR calc Af Amer: 32 mL/min — ABNORMAL LOW (ref >60)
GFR calc non Af Amer: 27 mL/min — ABNORMAL LOW (ref >60)
Glucose, Bld: 148 mg/dL — ABNORMAL HIGH (ref 65–99)
POTASSIUM: 4.2 mmol/L (ref 3.5–5.1)
Sodium: 138 mmol/L (ref 135–145)
Total Protein: 7.1 g/dL (ref 6.5–8.1)

## 2014-10-18 NOTE — Discharge Instructions (Signed)
Discontinue your Baclofen prescription. Follow up with your doctor for further evaluation. Return to the ED with worsening or concerning symptoms.

## 2014-10-18 NOTE — ED Provider Notes (Signed)
CSN: [id number]     Arrival date & time 10/18/14  1019 History   First MD Initiated Contact with Patient 10/18/14 1020     Chief Complaint  Patient presents with  . Altered Mental Status  . Chest Pain     (Consider location/radiation/quality/duration/timing/severity/associated sxs/prior Treatment) HPI Comments: Patient is a 74 year old male with a past medical history of TIA who presents after an episode of confusion that occurred this morning prior to arrival. Symptoms started suddenly and have now improved. He woke up this morning and was confused as to weather it was morning or night. He assumed it was morning because his cat came home and wanted to be fed. Patient reports this made him concerned because this has never happened before. He reports starting to take baclofen 3 days ago. His last dose was this morning. He also reports chest tightness that started this morning. The sensation is all over his chest without radiation. No aggravating/alleviating factors. No other associated symptoms.   Patient is a 74 y.o. male presenting with altered mental status and chest pain. The history is provided by the patient. No language interpreter was used.  Altered Mental Status Presenting symptoms: confusion   Severity:  Moderate Most recent episode:  Today Episode history:  Single Duration:  30 minutes Timing:  Intermittent Progression:  Resolved Chronicity:  New Context: recent change in medication   Context: not alcohol use, not dementia, not drug use, not head injury, not homeless, taking medications as prescribed, not a nursing home resident, not a recent illness and not a recent infection   Context comment:  Patient started taking Baclofen 3 days ago Associated symptoms: no abdominal pain, no fever, no nausea, no palpitations, no vomiting and no weakness   Chest Pain Associated symptoms: altered mental status and fatigue   Associated symptoms: no abdominal pain, no dizziness, no dysphagia,  no fever, no nausea, no palpitations, no shortness of breath, not vomiting and no weakness     Past Medical History  Diagnosis Date  . Hyperlipidemia     takes Fenofibrate daily  . Peripheral vascular disease   . 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     . Arthritis     DDD- Lower Back  . Hypertension     takes Amlodipine,Lisinopril,and Ziac daily  . Muscle spasm     takes Zanaflex daily as needed  . Insomnia     takes Melatonin nightly  . Pneumonia     hx of   . TIA (transient ischemic attack)     x 2;takes Plavix daily  . Rheumatoid arthritis   . Back pain     stenosis  . Joint pain   . Joint swelling   . History of colon polyps   . Diverticulosis   . Urinary frequency     takes Flomax daily  . Urinary urgency   . History of blood transfusion     no abnormal reaction noted  . Cataracts, bilateral     immature  . Rheumatic fever     as a child   Past Surgical History  Procedure Laterality Date  . Abdominal aortic aneurysm repair  2010  . Carotid endarterectomy  09/20/2004    Left  CEA  . Tonsillectomy    . Colonoscopy    . Upper gi endoscopy    . Inguinal hernia repair Left 02/05/2013    Procedure: HERNIA REPAIR INGUINAL ADULT;  Surgeon: Joyice Faster. Cornett, MD;  Location: [place];  Service: General;  Laterality: Left;  . Insertion of mesh Left 02/05/2013    Procedure: INSERTION OF MESH;  Surgeon: Joyice Faster. Cornett, MD;  Location: [place];  Service: General;  Laterality: Left;  . Testiclar cyst excision    . Vasectomy    . Hand surgery Right   . Shoulder arthroscopy with rotator cuff repair and subacromial decompression Left 05/01/2014    Procedure: LEFT ARTHROSCOPY SHOULDER SUBACROMIAL DECOMPRESSION,DISTAL CLAVICAL RESECTION AND ROTATOR CUFF REPAIR;  Surgeon: Marin Shutter, MD;  Location: [place];  Service: Orthopedics;  Laterality: Left;   Family History  Problem Relation Age of Onset  . Parkinsonism  Father   . Dementia Father   . Cancer Father     Throat  . Colon cancer Mother     died in 66s  . Cancer Mother     Colon   History  Substance Use Topics  . Smoking status: Former Smoker -- 2.00 packs/day for 40 years    Types: Cigarettes  . Smokeless tobacco: Never Used     Comment: quit smoking 25+yrs ago  . Alcohol Use: No    Review of Systems  Constitutional: Positive for fatigue. Negative for fever and chills.  HENT: Negative for trouble swallowing.   Eyes: Negative for visual disturbance.  Respiratory: Negative for shortness of breath.   Cardiovascular: Negative for chest pain and palpitations.  Gastrointestinal: Negative for nausea, vomiting, abdominal pain and diarrhea.  Genitourinary: Negative for dysuria and difficulty urinating.  Musculoskeletal: Negative for arthralgias and neck pain.  Skin: Negative for color change.  Neurological: Negative for dizziness and weakness.  Psychiatric/Behavioral: Positive for confusion. Negative for dysphoric mood.      Allergies  Review of patient's allergies indicates no known allergies.  Home Medications   Prior to Admission medications   Medication Sig Start Date End Date Taking? Authorizing Provider  amLODipine (NORVASC) 10 MG tablet TAKE ONE TABLET BY MOUTH ONCE DAILY 07/14/14  Yes Marin Olp, MD  baclofen (LIORESAL) 10 MG tablet Take 10 mg by mouth 2 (two) times daily as needed for muscle spasms.   Yes Historical Provider, MD  bisoprolol-hydrochlorothiazide (ZIAC) 10-6.25 MG per tablet Take 1 tablet by mouth daily. Patient taking differently: Take 1 tablet by mouth 2 (two) times daily.  01/27/14  Yes Marin Olp, MD  clopidogrel (PLAVIX) 75 MG tablet TAKE ONE TABLET BY MOUTH ONCE DAILY 08/25/14  Yes Marin Olp, MD  fenofibrate (TRICOR) 145 MG tablet TAKE ONE TABLET BY MOUTH ONCE DAILY 06/16/14  Yes Marin Olp, MD  gabapentin (NEURONTIN) 100 MG capsule TAKE ONE CAPSULE BY MOUTH THREE TIMES DAILY 07/14/14   Yes Marin Olp, MD  lisinopril (PRINIVIL,ZESTRIL) 5 MG tablet Take 0.5 tablets (2.5 mg total) by mouth daily. 1/2 a pill only until you see Dr. [name]. Patient taking differently: Take 2.5 mg by mouth daily.  02/17/14  Yes Marin Olp, MD  loratadine (CLARITIN) 10 MG tablet Take 10 mg by mouth daily as needed for allergies (congestion).   Yes Historical Provider, MD  Melatonin 3 MG TABS Take 3 mg by mouth at bedtime.    Yes Historical Provider, MD  oxyCODONE-acetaminophen (PERCOCET) 7.5-325 MG per tablet Take 1 tablet by mouth every 4 (four) hours as needed for severe pain.   Yes Historical Provider, MD  tamsulosin (FLOMAX) 0.4 MG CAPS capsule Take 1 capsule (0.4 mg total) by mouth daily. 05/21/14  Yes Brayton Mars  Hunter, MD  atorvastatin (LIPITOR) 20 MG tablet Take 1 tablet (20 mg total) by mouth daily. 09/22/14   Marin Olp, MD  diazepam (VALIUM) 5 MG tablet Take 0.5-1 tablets (2.5-5 mg total) by mouth every 6 (six) hours as needed for muscle spasms or sedation. Patient not taking: Reported on 09/22/2014 05/01/14   Jenetta Loges, PA-C  tiZANidine (ZANAFLEX) 4 MG tablet TAKE ONE TABLET BY MOUTH EVERY 6 HOURS AS NEEDED FOR MUSCLE SPASM Patient not taking: Reported on 10/18/2014 06/02/14   Marin Olp, MD   BP 153/58 mmHg  Pulse 49  Temp(Src) 98.1 F (36.7 C) (Oral)  Resp 16  SpO2 99% Physical Exam  Constitutional: He is oriented to person, place, and time. He appears well-developed and well-nourished. No distress.  HENT:  Head: Normocephalic and atraumatic.  Eyes: Conjunctivae and EOM are normal.  Neck: Normal range of motion.  Cardiovascular: Regular rhythm.  Exam reveals no gallop and no friction rub.   No murmur heard. Bradycardic  Pulmonary/Chest: Effort normal and breath sounds normal. He has no wheezes. He has no rales. He exhibits no tenderness.  Abdominal: Soft. He exhibits no distension. There is no tenderness. There is no rebound.  Musculoskeletal: Normal range  of motion.  Neurological: He is alert and oriented to person, place, and time. Coordination normal.  Speech is goal-oriented. Moves limbs without ataxia.   Skin: Skin is warm and dry.  Psychiatric: He has a normal mood and affect. His behavior is normal.  Nursing note and vitals reviewed.   ED Course  Procedures (including critical care time) Labs Review Labs Reviewed  CBC WITH DIFFERENTIAL/PLATELET - Abnormal; Notable for the following:    RBC 3.74 (*)    Hemoglobin 11.2 (*)    HCT 32.9 (*)    Eosinophils Relative 6 (*)    All other components within normal limits  COMPREHENSIVE METABOLIC PANEL - Abnormal; Notable for the following:    Glucose, Bld 148 (*)    BUN 30 (*)    Creatinine, Ser 2.22 (*)    Alkaline Phosphatase 26 (*)    GFR calc non Af Amer 27 (*)    GFR calc Af Amer 32 (*)    All other components within normal limits  URINALYSIS, ROUTINE W REFLEX MICROSCOPIC  I-STAT TROPOININ, ED    Imaging Review Dg Chest 2 View  10/18/2014   CLINICAL DATA:  Patient with altered mental status. Confusion. Chest tightness.  EXAM: CHEST  2 VIEW  COMPARISON:  Chest radiograph 11/17/2011  FINDINGS: Stable cardiac and mediastinal contours. No consolidative pulmonary opacities. No pleural effusion or pneumothorax. Unchanged calcified granuloma right upper lung. Mid thoracic spine degenerative changes.  IMPRESSION: No acute cardiopulmonary process.   Electronically Signed   By: Lovey Newcomer M.D.   On: 10/18/2014 12:01   Ct Head Wo Contrast  10/18/2014   CLINICAL DATA:  Confusion and disorientation. History of cerebral infarct.  EXAM: CT HEAD WITHOUT CONTRAST  TECHNIQUE: Contiguous axial images were obtained from the base of the skull through the vertex without intravenous contrast.  COMPARISON:  11/17/2011 CT and MRI brain studies  FINDINGS: In the region of previous small acute infarcts of the right frontal and right occipital lobes by MRI, small old subcortical infarcts are now visible by  CT. There is a new area of low attenuation within the central right frontal lobe near the vertex with a cortical and subcortical components. This is consistent with interval subacute infarct and occupies a territory of  under 2 cm. No associated acute hemorrhage, mass effect, extrinsic fluid collection or hydrocephalus. The skull is unremarkable.  IMPRESSION: 1. New interval subacute appearing right frontal lobe infarct near the vertex. This appears nonhemorrhagic. 2. Small old infarcts of the right frontal and occipital lobes. These correspond exactly to the small focal acute infarcts identified by MRI in in 2013.   Electronically Signed   By: Aletta Edouard M.D.   On: 10/18/2014 11:49   Mr Brain Wo Contrast  10/18/2014   CLINICAL DATA:  Confusion. Abnormal CT. Possible right cortical stroke on CT  EXAM: MRI HEAD WITHOUT CONTRAST  TECHNIQUE: Multiplanar, multiecho pulse sequences of the brain and surrounding structures were obtained without intravenous contrast.  COMPARISON:  CT head 10/18/2014.  MRI 11/27/2011  FINDINGS: Negative for acute infarct.  Chronic infarct right frontal lobe corresponds to the CT abnormality. Chronic small infarct also in the right parietal lobe and left parietal lobe. Chronic microvascular ischemic changes in the white matter are mild. Brainstem and cerebellum intact.  Negative for intracranial hemorrhage. Negative for mass or edema. No shift of the midline structures  Mild generalized atrophy without hydrocephalus  IMPRESSION: Negative for acute infarct  Chronic microvascular ischemic change in the white matter. Small chronic cortical infarcts in the right frontal lobe, right parietal lobe, left parietal lobe.   Electronically Signed   By: Franchot Gallo M.D.   On: 10/18/2014 14:28     EKG Interpretation None      MDM   Final diagnoses:  Cerebral infarction, chronic    10:43 AM Labs pending. Vitals stable and patient afebrile.   2:57 PM  CT shows subacute infarct.  Dr. Darl Householder spoke with Dr. Armida Sans who recommended MRI. MRI shows no acute infarct. Patient will be discharged and discontinued Baclofen.    Alvina Chou, PA-C 10/18/14 [place] Yao, MD 10/19/14 [phone]

## 2014-10-18 NOTE — ED Notes (Signed)
Patient transported to CT 

## 2014-10-18 NOTE — ED Notes (Signed)
Patient transported to MRI 

## 2014-10-18 NOTE — ED Notes (Signed)
Pt arrived from home by [facility] with c/o confusion this morning after he woke up. Pt stated that he woke up around 0730 and felt as though he did not know if it was night or day time. Hx of stroke in the past with no deficits. Pt is A&Ox4. Recently started on new medication baclofen that wife stated makes him tired. Pt had some anxiety with EMS. Stroke scale negative. Upon arrival to ED pt started that he has some chest tightness in the center of chest and rates it a 2-3/10. Initial BP with EMS 220/90 last BP-160/70 Bradycardic on monitor but pt stated that he normally has low HR.

## 2014-10-22 DIAGNOSIS — M4696 Unspecified inflammatory spondylopathy, lumbar region: Secondary | ICD-10-CM | POA: Diagnosis not present

## 2014-10-22 DIAGNOSIS — M488X6 Other specified spondylopathies, lumbar region: Secondary | ICD-10-CM | POA: Diagnosis not present

## 2014-10-22 DIAGNOSIS — M5137 Other intervertebral disc degeneration, lumbosacral region: Secondary | ICD-10-CM | POA: Diagnosis not present

## 2014-10-22 DIAGNOSIS — M47817 Spondylosis without myelopathy or radiculopathy, lumbosacral region: Secondary | ICD-10-CM | POA: Diagnosis not present

## 2014-10-22 DIAGNOSIS — E669 Obesity, unspecified: Secondary | ICD-10-CM | POA: Diagnosis not present

## 2014-10-22 DIAGNOSIS — Z79899 Other long term (current) drug therapy: Secondary | ICD-10-CM | POA: Diagnosis not present

## 2014-10-22 DIAGNOSIS — G894 Chronic pain syndrome: Secondary | ICD-10-CM | POA: Diagnosis not present

## 2014-10-30 DIAGNOSIS — M47817 Spondylosis without myelopathy or radiculopathy, lumbosacral region: Secondary | ICD-10-CM | POA: Diagnosis not present

## 2014-11-02 ENCOUNTER — Other Ambulatory Visit: Payer: Self-pay | Admitting: Family Medicine

## 2014-11-05 DIAGNOSIS — G894 Chronic pain syndrome: Secondary | ICD-10-CM | POA: Diagnosis not present

## 2014-11-05 DIAGNOSIS — M47817 Spondylosis without myelopathy or radiculopathy, lumbosacral region: Secondary | ICD-10-CM | POA: Diagnosis not present

## 2014-11-05 DIAGNOSIS — Z79899 Other long term (current) drug therapy: Secondary | ICD-10-CM | POA: Diagnosis not present

## 2014-11-05 DIAGNOSIS — M488X6 Other specified spondylopathies, lumbar region: Secondary | ICD-10-CM | POA: Diagnosis not present

## 2014-11-05 DIAGNOSIS — M5137 Other intervertebral disc degeneration, lumbosacral region: Secondary | ICD-10-CM | POA: Diagnosis not present

## 2014-11-05 DIAGNOSIS — M4696 Unspecified inflammatory spondylopathy, lumbar region: Secondary | ICD-10-CM | POA: Diagnosis not present

## 2014-11-05 DIAGNOSIS — E669 Obesity, unspecified: Secondary | ICD-10-CM | POA: Diagnosis not present

## 2014-11-06 ENCOUNTER — Other Ambulatory Visit: Payer: Self-pay | Admitting: *Deleted

## 2014-11-07 ENCOUNTER — Other Ambulatory Visit: Payer: Self-pay

## 2014-11-07 MED ORDER — ATORVASTATIN CALCIUM 20 MG PO TABS
20.0000 mg | ORAL_TABLET | Freq: Every day | ORAL | Status: DC
Start: 2014-11-07 — End: 2014-12-09

## 2014-11-07 MED ORDER — AMLODIPINE BESYLATE 10 MG PO TABS: 10.0000 mg | ORAL_TABLET | Freq: Every day | ORAL | Status: DC

## 2014-11-07 MED ORDER — FENOFIBRATE 145 MG PO TABS: 145.0000 mg | ORAL_TABLET | Freq: Every day | ORAL | Status: DC

## 2014-11-10 ENCOUNTER — Encounter: Payer: Self-pay | Admitting: Family Medicine

## 2014-11-10 ENCOUNTER — Ambulatory Visit (INDEPENDENT_AMBULATORY_CARE_PROVIDER_SITE_OTHER): Payer: Medicare Other | Admitting: Family Medicine

## 2014-11-10 VITALS — BP 144/78 | HR 47 | Temp 98.0°F | Wt 189.0 lb

## 2014-11-10 DIAGNOSIS — R739 Hyperglycemia, unspecified: Secondary | ICD-10-CM

## 2014-11-10 DIAGNOSIS — E785 Hyperlipidemia, unspecified: Secondary | ICD-10-CM | POA: Diagnosis not present

## 2014-11-10 DIAGNOSIS — N289 Disorder of kidney and ureter, unspecified: Secondary | ICD-10-CM

## 2014-11-10 DIAGNOSIS — I159 Secondary hypertension, unspecified: Secondary | ICD-10-CM

## 2014-11-10 DIAGNOSIS — I10 Essential (primary) hypertension: Secondary | ICD-10-CM | POA: Diagnosis not present

## 2014-11-10 LAB — COMPREHENSIVE METABOLIC PANEL
ALBUMIN: 4.3 g/dL (ref 3.5–5.2)
ALK PHOS: 27 U/L — AB (ref 39–117)
ALT: 19 U/L (ref 0–53)
AST: 23 U/L (ref 0–37)
BUN: 37 mg/dL — ABNORMAL HIGH (ref 6–23)
CO2: 24 mEq/L (ref 19–32)
Calcium: 9.1 mg/dL (ref 8.4–10.5)
Chloride: 104 mEq/L (ref 96–112)
Creatinine, Ser: 2.47 mg/dL — ABNORMAL HIGH (ref 0.40–1.50)
GFR: 27.2 mL/min — ABNORMAL LOW (ref >60.00)
GLUCOSE: 124 mg/dL — AB (ref 70–99)
Potassium: 4.5 mEq/L (ref 3.5–5.1)
SODIUM: 133 meq/L — AB (ref 135–145)
TOTAL PROTEIN: 7.1 g/dL (ref 6.0–8.3)
Total Bilirubin: 0.4 mg/dL (ref 0.2–1.2)

## 2014-11-10 LAB — LIPID PANEL
CHOLESTEROL: 146 mg/dL (ref 0–200)
HDL: 28.5 mg/dL — ABNORMAL LOW (ref >39.00)
LDL Cholesterol: 79 mg/dL (ref 0–99)
NONHDL: 117.5
Total CHOL/HDL Ratio: 5
Triglycerides: 195 mg/dL — ABNORMAL HIGH (ref 0.0–149.0)
VLDL: 39 mg/dL (ref 0.0–40.0)

## 2014-11-10 LAB — CBC
HEMATOCRIT: 34.1 % — AB (ref 39.0–52.0)
Hemoglobin: 11.5 g/dL — ABNORMAL LOW (ref 13.0–17.0)
MCHC: 33.7 g/dL (ref 30.0–36.0)
MCV: 90.4 fl (ref 78.0–100.0)
Platelets: 198 10*3/uL (ref 150.0–400.0)
RBC: 3.77 Mil/uL — AB (ref 4.22–5.81)
RDW: 13.8 % (ref 11.5–15.5)
WBC: 7.5 10*3/uL (ref 4.0–10.5)

## 2014-11-10 LAB — HEMOGLOBIN A1C: Hgb A1c MFr Bld: 6 % (ref 4.6–6.5)

## 2014-11-10 MED ORDER — BISOPROLOL-HYDROCHLOROTHIAZIDE 10-6.25 MG PO TABS: 1.0000 | ORAL_TABLET | Freq: Two times a day (BID) | ORAL | Status: DC

## 2014-11-10 NOTE — Progress Notes (Signed)
Garret Reddish, MD  Subjective:  Kevin [name] is a 74 y.o. year old very pleasant male patient who presents with:  Hypertension-poor control but did not take medicine this AM as fasting  ziac cost has become too high up over $10 at [facility] BP Readings from Last 3 Encounters:  11/10/14 144/78  10/18/14 159/54  09/22/14 144/78   Home BP monitoring-controlled at last check at pharmacy Compliant with medications-yes without side effects, except has nto taken yet today ROS-Denies any CP, HA, SOB, blurry vision, LE edema  Hyperlipidemia-started lipitor at last visit, continuing on fenofibrate  Lab Results  Component Value Date   [place] 80 03/12/2012   On statin: yes, recent restart Regular exercise: back pain limits, tries to remain active. Pushed to walk as able ROS- no chest pain or shortness of breath. No myalgias  Past Medical History- PAD, history CVA, CKD III, chronic pain syndrome now managed by pain management, history AAA s/p repair  Medications- reviewed and updated Current Outpatient Prescriptions  Medication Sig Dispense Refill  . amLODipine (NORVASC) 10 MG tablet Take 1 tablet (10 mg total) by mouth daily. 30 tablet 5  . atorvastatin (LIPITOR) 20 MG tablet Take 1 tablet (20 mg total) by mouth daily. 30 tablet 5  . bisoprolol-hydrochlorothiazide (ZIAC) 10-6.25 MG per tablet Take 1 tablet by mouth daily. (Patient taking differently: Take 1 tablet by mouth 2 (two) times daily. ) 30 tablet 11  . clopidogrel (PLAVIX) 75 MG tablet TAKE ONE TABLET BY MOUTH ONCE DAILY 90 tablet 1  . fenofibrate (TRICOR) 145 MG tablet Take 1 tablet (145 mg total) by mouth daily. 30 tablet 5  . gabapentin (NEURONTIN) 100 MG capsule TAKE ONE CAPSULE BY MOUTH THREE TIMES DAILY 90 capsule 2  . lisinopril (PRINIVIL,ZESTRIL) 5 MG tablet Take 0.5 tablets (2.5 mg total) by mouth daily. 1/2 a pill only until you see Dr. [name]. (Patient taking differently: Take 2.5 mg by mouth daily. ) 90 tablet 3  .  loratadine (CLARITIN) 10 MG tablet Take 10 mg by mouth daily as needed for allergies (congestion).    . Melatonin 3 MG TABS Take 3 mg by mouth at bedtime.     Marland Kitchen oxyCODONE-acetaminophen (PERCOCET) 7.5-325 MG per tablet Take 1 tablet by mouth every 4 (four) hours as needed for severe pain.    . tamsulosin (FLOMAX) 0.4 MG CAPS capsule Take 1 capsule (0.4 mg total) by mouth daily. 90 capsule 3  . diazepam (VALIUM) 5 MG tablet Take 0.5-1 tablets (2.5-5 mg total) by mouth every 6 (six) hours as needed for muscle spasms or sedation. (Patient not taking: Reported on 11/10/2014) 40 tablet 1  . tiZANidine (ZANAFLEX) 4 MG tablet TAKE ONE TABLET BY MOUTH EVERY 6 HOURS AS NEEDED FOR MUSCLE SPASM (Patient not taking: Reported on 10/18/2014) 30 tablet 5   No current facility-administered medications for this visit.    Objective: BP 144/78 mmHg  Pulse 47  Temp(Src) 98 F (36.7 C)  Wt 189 lb (85.73 kg) Gen: NAD, resting comfortably CV: slightly brady no murmurs rubs or gallops Lungs: CTAB no crackles, wheeze, rhonchi Abdomen: soft/nontender/nondistended/normal bowel sounds. No rebound or guarding.  Ext: no edema Skin: warm, dry, no rash  Neuro: grossly normal, moves all extremities   Assessment/Plan:  Hyperlipidemia Poor control off statin last check. lipitor 20mg  restarted and tricor was continued. Check lipids today  Essential hypertension Mild poor control today but has not taken meds including Bisoprolol-hctz max + amlodipine 10mg , lisinopril 5mg . Given level without  meds this AM, suspect would be controlled on meds. Advised home monitoring and return if >140/90. reviwed ziac on $4 [facility] list and sent in as #90 which should only last him 6 weeks as takes BID but should be $10 instead of >$100.   Fasting hyperglycemia S: at risk DM with last a1c 6.2. Has lost some weight- trying to be more active and eat better A/P: check a1c today    6 months  fasting Orders Placed This Encounter   Procedures  . Lipid Panel    Order Specific Question:  Has the patient fasted?    Answer:  Yes  . Hemoglobin A1c  . CBC (no diff)  . CMP    Order Specific Question:  Has the patient fasted?    Answer:  Yes    Meds ordered this encounter  Medications  . bisoprolol-hydrochlorothiazide (ZIAC) 10-6.25 MG per tablet    Sig: Take 1 tablet by mouth 2 (two) times daily.    Dispense:  90 tablet    Refill:  6

## 2014-11-10 NOTE — Assessment & Plan Note (Signed)
Mild poor control today but has not taken meds including Bisoprolol-hctz max + amlodipine 10mg , lisinopril 5mg . Given level without meds this AM, suspect would be controlled on meds. Advised home monitoring and return if >140/90. reviwed ziac on $4 [facility] list and sent in as #90 which should only last him 6 weeks as takes BID but should be $10 instead of >$100.

## 2014-11-10 NOTE — Patient Instructions (Addendum)
Try the bisoprolol the way I ordered it today. It says it should be $10 for 90 pills, if it is not, give [place] a call, may try [employer].   Blood pressure hair high but havent taken medicine today. Monitor at home at least once a month at a pharmacy and if getting above 140/90 come see me sooner  Otherwise see me in 6 months.   Check labs today  Bring [place] a copy of the shingles vaccine if you get it at [place]. Call us if you need a prescription.

## 2014-11-10 NOTE — Assessment & Plan Note (Signed)
Poor control off statin last check. lipitor 20mg  restarted and tricor was continued. Check lipids today

## 2014-11-10 NOTE — Assessment & Plan Note (Signed)
S: at risk DM with last a1c 6.2. Has lost some weight- trying to be more active and eat better A/P: check a1c today

## 2014-11-11 NOTE — Addendum Note (Signed)
Addended by: Clyde Lundborg A on: 11/11/2014 09:16 AM   Modules accepted: Orders

## 2014-11-17 ENCOUNTER — Telehealth: Payer: Self-pay | Admitting: Family Medicine

## 2014-11-17 ENCOUNTER — Other Ambulatory Visit: Payer: Self-pay | Admitting: Family Medicine

## 2014-11-17 NOTE — Telephone Encounter (Signed)
FYI Pt said all his prescriptions will now be sent [facility] Starting next Month  With 90 day supply  Please update his file to show the new pharmacy

## 2014-11-17 NOTE — Telephone Encounter (Signed)
Pharmacy updated.

## 2014-11-27 DIAGNOSIS — E785 Hyperlipidemia, unspecified: Secondary | ICD-10-CM | POA: Diagnosis not present

## 2014-11-27 DIAGNOSIS — I10 Essential (primary) hypertension: Secondary | ICD-10-CM | POA: Diagnosis not present

## 2014-11-27 DIAGNOSIS — N183 Chronic kidney disease, stage 3 (moderate): Secondary | ICD-10-CM | POA: Diagnosis not present

## 2014-12-03 DIAGNOSIS — M47817 Spondylosis without myelopathy or radiculopathy, lumbosacral region: Secondary | ICD-10-CM | POA: Diagnosis not present

## 2014-12-03 DIAGNOSIS — M4696 Unspecified inflammatory spondylopathy, lumbar region: Secondary | ICD-10-CM | POA: Diagnosis not present

## 2014-12-03 DIAGNOSIS — M5137 Other intervertebral disc degeneration, lumbosacral region: Secondary | ICD-10-CM | POA: Diagnosis not present

## 2014-12-03 DIAGNOSIS — Z79899 Other long term (current) drug therapy: Secondary | ICD-10-CM | POA: Diagnosis not present

## 2014-12-03 DIAGNOSIS — E669 Obesity, unspecified: Secondary | ICD-10-CM | POA: Diagnosis not present

## 2014-12-03 DIAGNOSIS — M488X6 Other specified spondylopathies, lumbar region: Secondary | ICD-10-CM | POA: Diagnosis not present

## 2014-12-03 DIAGNOSIS — G894 Chronic pain syndrome: Secondary | ICD-10-CM | POA: Diagnosis not present

## 2014-12-09 ENCOUNTER — Other Ambulatory Visit: Payer: Self-pay | Admitting: Nephrology

## 2014-12-09 ENCOUNTER — Other Ambulatory Visit: Payer: Self-pay | Admitting: *Deleted

## 2014-12-09 DIAGNOSIS — N183 Chronic kidney disease, stage 3 unspecified: Secondary | ICD-10-CM

## 2014-12-10 MED ORDER — TAMSULOSIN HCL 0.4 MG PO CAPS: 0.4000 mg | ORAL_CAPSULE | Freq: Every day | ORAL | Status: DC

## 2014-12-10 MED ORDER — GABAPENTIN 100 MG PO CAPS: 100.0000 mg | ORAL_CAPSULE | Freq: Three times a day (TID) | ORAL | Status: DC

## 2014-12-10 MED ORDER — AMLODIPINE BESYLATE 10 MG PO TABS: 10.0000 mg | ORAL_TABLET | Freq: Every day | ORAL | Status: DC

## 2014-12-10 MED ORDER — ATORVASTATIN CALCIUM 20 MG PO TABS: 20.0000 mg | ORAL_TABLET | Freq: Every day | ORAL | Status: DC

## 2014-12-10 MED ORDER — CLOPIDOGREL BISULFATE 75 MG PO TABS: 75.0000 mg | ORAL_TABLET | Freq: Every day | ORAL | Status: DC

## 2014-12-10 MED ORDER — TIZANIDINE HCL 4 MG PO TABS: ORAL_TABLET | ORAL | Status: DC

## 2014-12-10 MED ORDER — FENOFIBRATE 145 MG PO TABS: 145.0000 mg | ORAL_TABLET | Freq: Every day | ORAL | Status: DC

## 2014-12-12 ENCOUNTER — Ambulatory Visit
Admission: RE | Admit: 2014-12-12 | Discharge: 2014-12-12 | Disposition: A | Discharge status: Home / Self Care | Payer: Medicare Other | Source: Ambulatory Visit | Attending: Nephrology | Admitting: Nephrology

## 2014-12-12 DIAGNOSIS — N183 Chronic kidney disease, stage 3 unspecified: Secondary | ICD-10-CM

## 2014-12-12 DIAGNOSIS — N189 Chronic kidney disease, unspecified: Secondary | ICD-10-CM | POA: Diagnosis not present

## 2014-12-22 ENCOUNTER — Encounter: Payer: Self-pay | Admitting: Family Medicine

## 2014-12-22 ENCOUNTER — Ambulatory Visit (INDEPENDENT_AMBULATORY_CARE_PROVIDER_SITE_OTHER): Payer: Medicare Other | Admitting: Family Medicine

## 2014-12-22 VITALS — BP 152/78 | HR 62 | Temp 97.9°F | Wt 187.0 lb

## 2014-12-22 DIAGNOSIS — I713 Abdominal aortic aneurysm, ruptured, unspecified: Secondary | ICD-10-CM

## 2014-12-22 DIAGNOSIS — Z0000 Encounter for general adult medical examination without abnormal findings: Secondary | ICD-10-CM | POA: Diagnosis not present

## 2014-12-22 DIAGNOSIS — N183 Chronic kidney disease, stage 3 unspecified: Secondary | ICD-10-CM

## 2014-12-22 DIAGNOSIS — Z23 Encounter for immunization: Secondary | ICD-10-CM

## 2014-12-22 MED ORDER — GABAPENTIN 300 MG PO CAPS: 300.0000 mg | ORAL_CAPSULE | Freq: Two times a day (BID) | ORAL | Status: DC

## 2014-12-22 NOTE — Assessment & Plan Note (Signed)
S: poor control on Bisoprolol-hctz  + amlodipine 10mg  BP Readings from Last 3 Encounters:  12/22/14 152/78  11/10/14 144/78  10/18/14 159/54  Home measurements 126-140 mostly. About once a week above 150 (checks 3x a week) Stopped lisinopril as instructed by nephrology due to concern renal stenosis.  A/P: elevated above goal especially with CKD. Have asked patient to reach out to nephrology to discuss next steps to control BP as trying to avoid ace i or arb/already on beta blocker/upping hctz not ideal with renal issues.

## 2014-12-22 NOTE — Assessment & Plan Note (Signed)
S:S/p Repair 2010.  A/P: doing well postoperatively, no known complications

## 2014-12-22 NOTE — Assessment & Plan Note (Signed)
S: going to pain clinic. Does not want to increase percocet. Wonders if increasing gabapentin may help as has tolerated 300mg  a day and has helped in past for the back pain and radiating elements.  A/P: increase Gabapentin 300mg  BID from 100mg  TID (max dose 700mg  per day per up todate) and patient will also ask nephrology.

## 2014-12-22 NOTE — Assessment & Plan Note (Signed)
Now managed by nephrology. Recent u/s. Ace-i stopped

## 2014-12-22 NOTE — Progress Notes (Addendum)
Kevin Reddish, MD Phone: [phone]  Subjective:  Patient presents today for their annual physical. Chief complaint-noted.  Also See problem oriented charting  prevnar 13 given. Considering shingles  ROS- full  review of systems was completed and negative except for: chronic pain in back, hip, shoulder, knee. Denies chest pain/shortness of breath  The following were reviewed and entered/updated in epic: Past Medical History  Diagnosis Date  . Hyperlipidemia     takes Fenofibrate daily  . Peripheral vascular disease   . 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     . Arthritis     DDD- Lower Back  . Hypertension     takes Amlodipine,Lisinopril,and Ziac daily  . Muscle spasm     takes Zanaflex daily as needed  . Insomnia     takes Melatonin nightly  . Pneumonia     hx of   . TIA (transient ischemic attack)     x 2;takes Plavix daily  . Rheumatoid arthritis   . Back pain     stenosis  . Joint pain   . Joint swelling   . History of colon polyps   . Diverticulosis   . Urinary frequency     takes Flomax daily  . Urinary urgency   . History of blood transfusion     no abnormal reaction noted  . Cataracts, bilateral     immature  . Rheumatic fever     as a child  . Bradycardia    Patient Active Problem List   Diagnosis Date Noted  . CKD (chronic kidney disease), stage III 01/27/2014    Priority: High  . PVD (peripheral vascular disease) 01/23/2014    Priority: High  . AAA (abdominal aortic aneurysm, ruptured) 01/17/2013    Priority: High  . CVA (cerebral infarction) 11/17/2011    Priority: High  . Chronic pain syndrome 12/23/2009    Priority: High  . Personal history of colonic polyps 05/15/2013    Priority: Medium  . Carotid Artery Stenosis L s/p endarterectomy 01/12/2012    Priority: Medium  . TREMOR, ESSENTIAL 10/05/2009    Priority: Medium  . UNSPECIFIED ANEMIA 12/05/2008    Priority: Medium  . BPH (benign prostatic  hyperplasia) 06/04/2007    Priority: Medium  . Fasting hyperglycemia 02/05/2007    Priority: Medium  . Hyperlipidemia 01/03/2007    Priority: Medium  . Essential hypertension 01/03/2007    Priority: Medium  . Family history of malignant neoplasm of gastrointestinal tract 05/15/2013    Priority: Low  . Inguinal hernia 05/01/2012    Priority: Low  . BURSITIS, HIP 12/21/2009    Priority: Low  . ACTINIC KERATOSIS, EAR, LEFT 03/09/2009    Priority: Low  . Left knee Osteoarthritis 11/02/2007    Priority: Low  . 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 Abdominal aortic aneurysm repair  2010  . Carotid endarterectomy  09/20/2004    Left  CEA  . Tonsillectomy    . Colonoscopy    . Upper gi endoscopy    . Inguinal hernia repair Left 02/05/2013    Procedure: HERNIA REPAIR INGUINAL ADULT;  Surgeon: Joyice Faster. Cornett, MD;  Location: [facility];  Service: General;  Laterality: Left;  . Insertion of mesh Left 02/05/2013    Procedure: INSERTION OF MESH;  Surgeon: Joyice Faster. Cornett, MD;  Location: [facility];  Service: General;  Laterality: Left;  .  Testiclar cyst excision    . Vasectomy    . Hand surgery Right   . Shoulder arthroscopy with rotator cuff repair and subacromial decompression Left 05/01/2014    Procedure: LEFT ARTHROSCOPY SHOULDER SUBACROMIAL DECOMPRESSION,DISTAL CLAVICAL RESECTION AND ROTATOR CUFF REPAIR;  Surgeon: Marin Shutter, MD;  Location: [facility];  Service: Orthopedics;  Laterality: Left;    Family History  Problem Relation Age of Onset  . Parkinsonism Father   . Dementia Father   . Cancer Father     Throat  . Colon cancer Mother     died in 68s  . Cancer Mother     Colon    Medications- reviewed and updated Current Outpatient Prescriptions  Medication Sig Dispense Refill  . amLODipine (NORVASC) 10 MG tablet Take 1 tablet (10 mg total) by mouth daily. 90 tablet  1  . atorvastatin (LIPITOR) 20 MG tablet Take 1 tablet (20 mg total) by mouth daily. 90 tablet 1  . bisoprolol-hydrochlorothiazide (ZIAC) 10-6.25 MG per tablet Take 1 tablet by mouth 2 (two) times daily. 90 tablet 6  . clopidogrel (PLAVIX) 75 MG tablet Take 1 tablet (75 mg total) by mouth daily. 90 tablet 1  . fenofibrate (TRICOR) 145 MG tablet Take 1 tablet (145 mg total) by mouth daily. 90 tablet 1  . gabapentin (NEURONTIN) 100 MG capsule Take 1 capsule (100 mg total) by mouth 3 (three) times daily. 270 capsule 1  . lisinopril (PRINIVIL,ZESTRIL) 5 MG tablet Take 0.5 tablets (2.5 mg total) by mouth daily. 1/2 a pill only until you see Dr. [name]. (Patient taking differently: Take 2.5 mg by mouth daily. ) 90 tablet 3  . loratadine (CLARITIN) 10 MG tablet Take 10 mg by mouth daily as needed for allergies (congestion).    . Melatonin 3 MG TABS Take 3 mg by mouth at bedtime.     . tamsulosin (FLOMAX) 0.4 MG CAPS capsule Take 1 capsule (0.4 mg total) by mouth daily. 90 capsule 1  . tiZANidine (ZANAFLEX) 4 MG tablet TAKE ONE TABLET BY MOUTH EVERY 6 HOURS AS NEEDED FOR MUSCLE SPASM 90 tablet 1  . diazepam (VALIUM) 5 MG tablet Take 0.5-1 tablets (2.5-5 mg total) by mouth every 6 (six) hours as needed for muscle spasms or sedation. (Patient not taking: Reported on 11/10/2014) 40 tablet 1  . oxyCODONE-acetaminophen (PERCOCET) 7.5-325 MG per tablet Take 1 tablet by mouth every 4 (four) hours as needed for severe pain.     Allergies-reviewed and updated No Known Allergies  History   Social History  . Marital Status: Married    Spouse Name: N/A  . Number of Children: N/A  . Years of Education: N/A   Social History Main Topics  . Smoking status: Former Smoker -- 2.00 packs/day for 40 years    Types: Cigarettes  . Smokeless tobacco: Never Used     Comment: quit smoking 25+yrs ago  . Alcohol Use: No  . Drug Use: Yes    Special: Marijuana     Comment: hx of  . Sexual Activity: Yes   Other Topics  Concern  . None   Social History Narrative   Married 33 years in 2015. Lived together for 12 years. 2 boys from first wife. 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       Hobbies: yardwork, [place] barn, woodwork, Orthoptist  ROS--See HPI   Objective: BP 152/78 mmHg  Pulse 62  Temp(Src) 97.9 F (36.6 C)  Wt 187 lb (84.823 kg) Gen: NAD, resting comfortably HEENT: Mucous membranes are moist. Oropharynx normal Neck: no thyromegaly CV: RRR no murmurs rubs or gallops Lungs: CTAB no crackles, wheeze, rhonchi Abdomen: soft/nontender/nondistended/normal bowel sounds. No rebound or guarding.  Rectal not indicated Ext: no edema, difficulty finding pulses (known PAD) Skin: warm, dry, small bruise left arm Neuro: grossly normal, moves all extremities, PERRLA   Assessment/Plan:  74 y.o. male presenting for annual physical.  Health Maintenance counseling: 1. Anticipatory guidance: Patient counseled regarding regular dental exams (does not need due to false teeth), yearly eye exams, wearing seatbelts, wearing sunscreen. Does not see dermatology.  2. Risk factor reduction:  Advised patient of need for regular exercise and diet rich and fruits and vegetables to reduce risk of heart attack and stroke.  3. Immunizations/screenings/ancillary studies- Prevnar 13 recommended. Considering shingles.  4. Colonoscopy 06/20/13- consider 5 year repeat based on medical history. Has history of polyps.  5. Declines rectal exam, PSA testing.   Essential hypertension S: poor control on Bisoprolol-hctz  + amlodipine 10mg  BP Readings from Last 3 Encounters:  12/22/14 152/78  11/10/14 144/78  10/18/14 159/54  Home measurements 126-140 mostly. About once a week above 150 (checks 3x a week) Stopped lisinopril as instructed by nephrology due to concern renal stenosis.  A/P: elevated above  goal especially with CKD. Have asked patient to reach out to nephrology to discuss next steps to control BP as trying to avoid ace i or arb/already on beta blocker/upping hctz not ideal with renal issues.    Chronic pain syndrome S: going to pain clinic. Does not want to increase percocet. Wonders if increasing gabapentin may help as has tolerated 300mg  a day and has helped in past for the back pain and radiating elements.  A/P: increase Gabapentin 300mg  BID from 100mg  TID (max dose 700mg  per day per up todate) and patient will also ask nephrology.    AAA (abdominal aortic aneurysm, ruptured) S:S/p Repair 2010.  A/P: doing well postoperatively, no known complications   CKD (chronic kidney disease), stage III Now managed by nephrology. Recent u/s. Ace-i stopped  6 month f/u.   Orders Placed This Encounter  Procedures  . Pneumococcal conjugate vaccine 13-valent   Meds ordered this encounter  Medications  . gabapentin (NEURONTIN) 300 MG capsule    Sig: Take 1 capsule (300 mg total) by mouth 2 (two) times daily.    Dispense:  180 capsule    Refill:  3

## 2014-12-22 NOTE — Patient Instructions (Addendum)
Got Prevnar 13 (2nd pneumonia shot)  If you get shingles shot- let us know. I am going to leave this one up to you. Can get at [facility].   Increase gabapentin to 300mg  twice a day (3 pills of 100mg  twice a day until you run out and get larger pill of 300mg )  After you get your ultrasound and speak to Dr. Moshe Cipro by phone, please let her know 1. About once a week you are getting 150 top # on blood pressure and Dr. [name] got the same in his office 2. Let her know we have you on 600mg  gabapentin a day and Dr. [name] is using 700mg  a day cut off and will not go above that but can go back down if she desires.   Given your kidney situation, I would prefer her adjust your medications

## 2014-12-24 ENCOUNTER — Other Ambulatory Visit: Payer: Self-pay | Admitting: Family Medicine

## 2014-12-24 MED ORDER — DIAZEPAM 5 MG PO TABS: 2.5000 mg | ORAL_TABLET | Freq: Four times a day (QID) | ORAL | Status: DC | PRN

## 2014-12-24 NOTE — Telephone Encounter (Signed)
Yes thanks 

## 2014-12-24 NOTE — Telephone Encounter (Signed)
Refill ok? 

## 2014-12-24 NOTE — Telephone Encounter (Signed)
Patient last seen on 11/10/14, at that visit in the medication review it was marked as Not taking.Medication was last filled on 05/01/2014 40 tabs with 1 refill. [facility] faxed a request for refill.

## 2014-12-30 ENCOUNTER — Telehealth: Payer: Self-pay | Admitting: *Deleted

## 2014-12-30 NOTE — Telephone Encounter (Signed)
Pt should only be taking the Gabapentin 100mg  TID until he runs out and will then just be on the 300mg  tab.

## 2014-12-30 NOTE — Telephone Encounter (Signed)
Patient has been prescribed both Neurontin 100 mg TID and Neurontin 300 mg BID.  Should patient be on both medications?

## 2014-12-31 NOTE — Telephone Encounter (Signed)
[facility] callback they are aware waiting on md

## 2014-12-31 NOTE — Telephone Encounter (Signed)
explanation was faxed to mail order pharmacy

## 2015-01-01 NOTE — Telephone Encounter (Signed)
[facility] is calling back needing clarification

## 2015-01-13 DIAGNOSIS — M488X6 Other specified spondylopathies, lumbar region: Secondary | ICD-10-CM | POA: Diagnosis not present

## 2015-01-13 DIAGNOSIS — M47817 Spondylosis without myelopathy or radiculopathy, lumbosacral region: Secondary | ICD-10-CM | POA: Diagnosis not present

## 2015-01-13 DIAGNOSIS — G894 Chronic pain syndrome: Secondary | ICD-10-CM | POA: Diagnosis not present

## 2015-01-13 DIAGNOSIS — M4696 Unspecified inflammatory spondylopathy, lumbar region: Secondary | ICD-10-CM | POA: Diagnosis not present

## 2015-01-13 DIAGNOSIS — M5137 Other intervertebral disc degeneration, lumbosacral region: Secondary | ICD-10-CM | POA: Diagnosis not present

## 2015-01-13 DIAGNOSIS — Z79899 Other long term (current) drug therapy: Secondary | ICD-10-CM | POA: Diagnosis not present

## 2015-01-21 DIAGNOSIS — H524 Presbyopia: Secondary | ICD-10-CM | POA: Diagnosis not present

## 2015-01-22 DIAGNOSIS — M5137 Other intervertebral disc degeneration, lumbosacral region: Secondary | ICD-10-CM | POA: Diagnosis not present

## 2015-01-27 DIAGNOSIS — N183 Chronic kidney disease, stage 3 (moderate): Secondary | ICD-10-CM | POA: Diagnosis not present

## 2015-01-27 DIAGNOSIS — E785 Hyperlipidemia, unspecified: Secondary | ICD-10-CM | POA: Diagnosis not present

## 2015-01-27 DIAGNOSIS — I10 Essential (primary) hypertension: Secondary | ICD-10-CM | POA: Diagnosis not present

## 2015-01-27 LAB — BASIC METABOLIC PANEL
BUN: 32 mg/dL — AB (ref 4–21)
Creatinine: 1.8 mg/dL — AB (ref 0.6–1.3)
Glucose: 105 mg/dL
Potassium: 4.8 mmol/L (ref 3.4–5.3)
Sodium: 136 mmol/L — AB (ref 137–147)

## 2015-01-27 LAB — CBC AND DIFFERENTIAL: Hemoglobin: 11.3 g/dL — AB (ref 13.5–17.5)

## 2015-01-28 ENCOUNTER — Other Ambulatory Visit: Payer: Self-pay | Admitting: Family Medicine

## 2015-01-29 ENCOUNTER — Encounter: Payer: Self-pay | Admitting: Family Medicine

## 2015-02-05 ENCOUNTER — Other Ambulatory Visit: Payer: Self-pay | Admitting: *Deleted

## 2015-02-05 NOTE — Telephone Encounter (Signed)
Yes thanks 

## 2015-02-05 NOTE — Telephone Encounter (Signed)
Refill ok? 

## 2015-02-06 MED ORDER — DIAZEPAM 5 MG PO TABS: 2.5000 mg | ORAL_TABLET | Freq: Four times a day (QID) | ORAL | Status: DC | PRN

## 2015-02-09 ENCOUNTER — Telehealth: Payer: Self-pay | Admitting: Family Medicine

## 2015-02-09 MED ORDER — GABAPENTIN 300 MG PO CAPS: 300.0000 mg | ORAL_CAPSULE | Freq: Two times a day (BID) | ORAL | Status: DC

## 2015-02-09 NOTE — Telephone Encounter (Signed)
Pt needs gabapentin  300 mg twice a day #180 w/refills  send to [facility]

## 2015-02-09 NOTE — Telephone Encounter (Signed)
Refill sent to [facility] 

## 2015-02-12 DIAGNOSIS — M545 Low back pain: Secondary | ICD-10-CM | POA: Diagnosis not present

## 2015-02-12 DIAGNOSIS — M79609 Pain in unspecified limb: Secondary | ICD-10-CM | POA: Diagnosis not present

## 2015-02-19 DIAGNOSIS — M47817 Spondylosis without myelopathy or radiculopathy, lumbosacral region: Secondary | ICD-10-CM | POA: Diagnosis not present

## 2015-02-19 DIAGNOSIS — M4696 Unspecified inflammatory spondylopathy, lumbar region: Secondary | ICD-10-CM | POA: Diagnosis not present

## 2015-02-19 DIAGNOSIS — G894 Chronic pain syndrome: Secondary | ICD-10-CM | POA: Diagnosis not present

## 2015-02-19 DIAGNOSIS — M5137 Other intervertebral disc degeneration, lumbosacral region: Secondary | ICD-10-CM | POA: Diagnosis not present

## 2015-02-20 ENCOUNTER — Other Ambulatory Visit: Payer: Self-pay | Admitting: Family Medicine

## 2015-02-24 ENCOUNTER — Other Ambulatory Visit: Payer: Self-pay

## 2015-02-24 DIAGNOSIS — H2512 Age-related nuclear cataract, left eye: Secondary | ICD-10-CM | POA: Diagnosis not present

## 2015-02-24 DIAGNOSIS — H18412 Arcus senilis, left eye: Secondary | ICD-10-CM | POA: Diagnosis not present

## 2015-02-24 DIAGNOSIS — H2511 Age-related nuclear cataract, right eye: Secondary | ICD-10-CM | POA: Diagnosis not present

## 2015-02-24 DIAGNOSIS — H18411 Arcus senilis, right eye: Secondary | ICD-10-CM | POA: Diagnosis not present

## 2015-02-24 DIAGNOSIS — H02839 Dermatochalasis of unspecified eye, unspecified eyelid: Secondary | ICD-10-CM | POA: Diagnosis not present

## 2015-03-16 ENCOUNTER — Other Ambulatory Visit: Payer: Self-pay | Admitting: Family Medicine

## 2015-03-16 DIAGNOSIS — Z79899 Other long term (current) drug therapy: Secondary | ICD-10-CM | POA: Diagnosis not present

## 2015-03-16 DIAGNOSIS — G894 Chronic pain syndrome: Secondary | ICD-10-CM | POA: Diagnosis not present

## 2015-03-16 DIAGNOSIS — M5137 Other intervertebral disc degeneration, lumbosacral region: Secondary | ICD-10-CM | POA: Diagnosis not present

## 2015-03-16 DIAGNOSIS — M47817 Spondylosis without myelopathy or radiculopathy, lumbosacral region: Secondary | ICD-10-CM | POA: Diagnosis not present

## 2015-03-16 DIAGNOSIS — M4696 Unspecified inflammatory spondylopathy, lumbar region: Secondary | ICD-10-CM | POA: Diagnosis not present

## 2015-03-17 ENCOUNTER — Inpatient Hospital Stay (HOSPITAL_COMMUNITY)
Admission: EM | Admit: 2015-03-17 | Discharge: 2015-03-18 | DRG: 195 | Disposition: A | Discharge status: Home / Self Care | Payer: Medicare Other | Attending: Internal Medicine | Admitting: Internal Medicine

## 2015-03-17 ENCOUNTER — Encounter (HOSPITAL_COMMUNITY): Payer: Self-pay | Admitting: Emergency Medicine

## 2015-03-17 ENCOUNTER — Emergency Department (HOSPITAL_COMMUNITY): Payer: Medicare Other

## 2015-03-17 DIAGNOSIS — R509 Fever, unspecified: Secondary | ICD-10-CM | POA: Diagnosis not present

## 2015-03-17 DIAGNOSIS — J189 Pneumonia, unspecified organism: Secondary | ICD-10-CM | POA: Diagnosis present

## 2015-03-17 DIAGNOSIS — N184 Chronic kidney disease, stage 4 (severe): Secondary | ICD-10-CM | POA: Diagnosis present

## 2015-03-17 DIAGNOSIS — G250 Essential tremor: Secondary | ICD-10-CM | POA: Diagnosis present

## 2015-03-17 DIAGNOSIS — Z8673 Personal history of transient ischemic attack (TIA), and cerebral infarction without residual deficits: Secondary | ICD-10-CM

## 2015-03-17 DIAGNOSIS — Z87891 Personal history of nicotine dependence: Secondary | ICD-10-CM | POA: Diagnosis not present

## 2015-03-17 DIAGNOSIS — G894 Chronic pain syndrome: Secondary | ICD-10-CM | POA: Diagnosis not present

## 2015-03-17 DIAGNOSIS — I129 Hypertensive chronic kidney disease with stage 1 through stage 4 chronic kidney disease, or unspecified chronic kidney disease: Secondary | ICD-10-CM | POA: Diagnosis present

## 2015-03-17 DIAGNOSIS — E860 Dehydration: Secondary | ICD-10-CM | POA: Diagnosis not present

## 2015-03-17 DIAGNOSIS — E785 Hyperlipidemia, unspecified: Secondary | ICD-10-CM | POA: Diagnosis present

## 2015-03-17 DIAGNOSIS — R350 Frequency of micturition: Secondary | ICD-10-CM | POA: Diagnosis present

## 2015-03-17 DIAGNOSIS — I10 Essential (primary) hypertension: Secondary | ICD-10-CM | POA: Diagnosis present

## 2015-03-17 DIAGNOSIS — R7989 Other specified abnormal findings of blood chemistry: Secondary | ICD-10-CM | POA: Diagnosis present

## 2015-03-17 DIAGNOSIS — N400 Enlarged prostate without lower urinary tract symptoms: Secondary | ICD-10-CM | POA: Diagnosis present

## 2015-03-17 DIAGNOSIS — N183 Chronic kidney disease, stage 3 (moderate): Secondary | ICD-10-CM | POA: Diagnosis not present

## 2015-03-17 DIAGNOSIS — R0602 Shortness of breath: Secondary | ICD-10-CM | POA: Diagnosis not present

## 2015-03-17 HISTORY — DX: Other chronic pain: G89.29

## 2015-03-17 HISTORY — DX: Pneumonia, unspecified organism: J18.9

## 2015-03-17 HISTORY — DX: Low back pain, unspecified: M54.50

## 2015-03-17 HISTORY — DX: Low back pain: M54.5

## 2015-03-17 LAB — COMPREHENSIVE METABOLIC PANEL
ALBUMIN: 3.7 g/dL (ref 3.5–5.0)
ALK PHOS: 23 U/L — AB (ref 38–126)
ALT: 23 U/L (ref 17–63)
ANION GAP: 12 (ref 5–15)
AST: 35 U/L (ref 15–41)
BILIRUBIN TOTAL: 0.7 mg/dL (ref 0.3–1.2)
BUN: 29 mg/dL — AB (ref 6–20)
CALCIUM: 9.2 mg/dL (ref 8.9–10.3)
CO2: 23 mmol/L (ref 22–32)
Chloride: 104 mmol/L (ref 101–111)
Creatinine, Ser: 1.99 mg/dL — ABNORMAL HIGH (ref 0.61–1.24)
GFR calc Af Amer: 36 mL/min — ABNORMAL LOW (ref >60)
GFR, EST NON AFRICAN AMERICAN: 31 mL/min — AB (ref >60)
GLUCOSE: 153 mg/dL — AB (ref 65–99)
Potassium: 3.9 mmol/L (ref 3.5–5.1)
Sodium: 139 mmol/L (ref 135–145)
TOTAL PROTEIN: 6.4 g/dL — AB (ref 6.5–8.1)

## 2015-03-17 LAB — CBC WITH DIFFERENTIAL/PLATELET
BASOS ABS: 0 10*3/uL (ref 0.0–0.1)
BASOS PCT: 0 %
EOS PCT: 2 %
Eosinophils Absolute: 0.1 10*3/uL (ref 0.0–0.7)
HEMATOCRIT: 36.6 % — AB (ref 39.0–52.0)
HEMOGLOBIN: 12.4 g/dL — AB (ref 13.0–17.0)
LYMPHS ABS: 0.8 10*3/uL (ref 0.7–4.0)
LYMPHS PCT: 12 %
MCH: 30.4 pg (ref 26.0–34.0)
MCHC: 33.9 g/dL (ref 30.0–36.0)
MCV: 89.7 fL (ref 78.0–100.0)
MONOS PCT: 5 %
Monocytes Absolute: 0.3 10*3/uL (ref 0.1–1.0)
Neutro Abs: 5.6 10*3/uL (ref 1.7–7.7)
Neutrophils Relative %: 81 %
Platelets: 181 10*3/uL (ref 150–400)
RBC: 4.08 MIL/uL — ABNORMAL LOW (ref 4.22–5.81)
RDW: 13.3 % (ref 11.5–15.5)
WBC Morphology: INCREASED
WBC: 6.8 10*3/uL (ref 4.0–10.5)

## 2015-03-17 LAB — STREP PNEUMONIAE URINARY ANTIGEN: Strep Pneumo Urinary Antigen: NEGATIVE

## 2015-03-17 LAB — URINALYSIS, ROUTINE W REFLEX MICROSCOPIC
BILIRUBIN URINE: NEGATIVE
GLUCOSE, UA: NEGATIVE mg/dL
HGB URINE DIPSTICK: NEGATIVE
Ketones, ur: NEGATIVE mg/dL
Leukocytes, UA: NEGATIVE
Nitrite: NEGATIVE
Protein, ur: NEGATIVE mg/dL
SPECIFIC GRAVITY, URINE: 1.009 (ref 1.005–1.030)
Urobilinogen, UA: 0.2 mg/dL (ref 0.0–1.0)
pH: 5 (ref 5.0–8.0)

## 2015-03-17 LAB — INFLUENZA PANEL BY PCR (TYPE A & B)
H1N1 flu by pcr: NOT DETECTED
Influenza A By PCR: NEGATIVE
Influenza B By PCR: NEGATIVE

## 2015-03-17 LAB — I-STAT CG4 LACTIC ACID, ED
LACTIC ACID, VENOUS: 2.98 mmol/L — AB (ref 0.5–2.0)
Lactic Acid, Venous: 1.36 mmol/L (ref 0.5–2.0)

## 2015-03-17 LAB — PROCALCITONIN: Procalcitonin: 0.12 ng/mL

## 2015-03-17 MED ORDER — IBUPROFEN 800 MG PO TABS
800.0000 mg | ORAL_TABLET | Freq: Once | ORAL | Status: AC
  Administered 2015-03-17: 800 mg via ORAL
  Filled 2015-03-17: qty 1

## 2015-03-17 MED ORDER — DEXTROSE 5 % IV SOLN
1.0000 g | Freq: Once | INTRAVENOUS | Status: DC
  Filled 2015-03-17: qty 10

## 2015-03-17 MED ORDER — LORATADINE 10 MG PO TABS: 10.0000 mg | ORAL_TABLET | Freq: Every day | ORAL | Status: DC | PRN

## 2015-03-17 MED ORDER — TIZANIDINE HCL 4 MG PO TABS
2.0000 mg | ORAL_TABLET | Freq: Every morning | ORAL | Status: DC
  Administered 2015-03-17 – 2015-03-18 (×2): 2 mg via ORAL
  Filled 2015-03-17 (×2): qty 1

## 2015-03-17 MED ORDER — DEXTROSE 5 % IV SOLN
500.0000 mg | Freq: Once | INTRAVENOUS | Status: DC
  Filled 2015-03-17: qty 500

## 2015-03-17 MED ORDER — ENOXAPARIN SODIUM 40 MG/0.4ML ~~LOC~~ SOLN
40.0000 mg | SUBCUTANEOUS | Status: DC
  Administered 2015-03-17: 40 mg via SUBCUTANEOUS
  Filled 2015-03-17: qty 0.4

## 2015-03-17 MED ORDER — MELATONIN 3 MG PO TABS: 3.0000 mg | ORAL_TABLET | Freq: Every day | ORAL | Status: DC

## 2015-03-17 MED ORDER — SODIUM CHLORIDE 0.9 % IV BOLUS (SEPSIS)
500.0000 mL | INTRAVENOUS | Status: AC
  Administered 2015-03-17: 500 mL via INTRAVENOUS

## 2015-03-17 MED ORDER — TAMSULOSIN HCL 0.4 MG PO CAPS
0.4000 mg | ORAL_CAPSULE | Freq: Every day | ORAL | Status: DC
  Administered 2015-03-18: 0.4 mg via ORAL
  Filled 2015-03-17: qty 1

## 2015-03-17 MED ORDER — FENOFIBRATE 54 MG PO TABS
54.0000 mg | ORAL_TABLET | Freq: Every day | ORAL | Status: DC
  Administered 2015-03-18: 54 mg via ORAL
  Filled 2015-03-17: qty 1

## 2015-03-17 MED ORDER — ENSURE ENLIVE PO LIQD: 237.0000 mL | Freq: Two times a day (BID) | ORAL | Status: DC

## 2015-03-17 MED ORDER — SODIUM CHLORIDE 0.9 % IV BOLUS (SEPSIS)
1000.0000 mL | Freq: Once | INTRAVENOUS | Status: AC
  Administered 2015-03-17: 1000 mL via INTRAVENOUS

## 2015-03-17 MED ORDER — BISOPROLOL FUMARATE 5 MG PO TABS
5.0000 mg | ORAL_TABLET | Freq: Every day | ORAL | Status: DC
  Administered 2015-03-18: 5 mg via ORAL
  Filled 2015-03-17: qty 1

## 2015-03-17 MED ORDER — SODIUM CHLORIDE 0.9 % IV BOLUS (SEPSIS)
1000.0000 mL | INTRAVENOUS | Status: AC
  Administered 2015-03-17: 1000 mL via INTRAVENOUS

## 2015-03-17 MED ORDER — DIAZEPAM 5 MG PO TABS: 2.5000 mg | ORAL_TABLET | Freq: Four times a day (QID) | ORAL | Status: DC | PRN

## 2015-03-17 MED ORDER — SODIUM CHLORIDE 0.9 % IV SOLN
1000.0000 mL | INTRAVENOUS | Status: DC
  Administered 2015-03-17 (×2): 1000 mL via INTRAVENOUS

## 2015-03-17 MED ORDER — DEXTROSE 5 % IV SOLN
500.0000 mg | INTRAVENOUS | Status: DC
  Administered 2015-03-17: 500 mg via INTRAVENOUS

## 2015-03-17 MED ORDER — DEXTROSE 5 % IV SOLN
1.0000 g | INTRAVENOUS | Status: DC
  Administered 2015-03-17: 1 g via INTRAVENOUS
  Filled 2015-03-17: qty 10

## 2015-03-17 MED ORDER — OXYCODONE-ACETAMINOPHEN 7.5-325 MG PO TABS: 1.0000 | ORAL_TABLET | ORAL | Status: DC | PRN

## 2015-03-17 MED ORDER — GABAPENTIN 100 MG PO CAPS
100.0000 mg | ORAL_CAPSULE | Freq: Three times a day (TID) | ORAL | Status: DC
  Administered 2015-03-17 – 2015-03-18 (×3): 100 mg via ORAL
  Filled 2015-03-17 (×3): qty 1

## 2015-03-17 MED ORDER — ATORVASTATIN CALCIUM 20 MG PO TABS
20.0000 mg | ORAL_TABLET | Freq: Every day | ORAL | Status: DC
  Administered 2015-03-18: 20 mg via ORAL
  Filled 2015-03-17: qty 1

## 2015-03-17 MED ORDER — CLOPIDOGREL BISULFATE 75 MG PO TABS
75.0000 mg | ORAL_TABLET | Freq: Every day | ORAL | Status: DC
  Administered 2015-03-18: 75 mg via ORAL
  Filled 2015-03-17: qty 1

## 2015-03-17 MED ORDER — TIZANIDINE HCL 4 MG PO TABS
8.0000 mg | ORAL_TABLET | Freq: Every day | ORAL | Status: DC
  Administered 2015-03-17: 8 mg via ORAL
  Filled 2015-03-17: qty 2

## 2015-03-17 NOTE — Progress Notes (Signed)
ANTIBIOTIC CONSULT NOTE - INITIAL  Pharmacy Consult for azithromycin, ceftriaxone Indication: pneumonia  No Known Allergies  Patient Measurements: Height: 5\' 9"  (175.3 cm) Weight: 180 lb (81.647 kg) IBW/kg (Calculated) : 70.7  Vital Signs: Temp: 101.5 F (38.6 C) (10/18 1146) Temp Source: Oral (10/18 1146) BP: 119/56 mmHg (10/18 1146) Pulse Rate: 69 (10/18 1146) Intake/Output from previous day:   Intake/Output from this shift:    Labs:  Recent Labs  03/17/15 1110  WBC 6.8  HGB 12.4*  PLT 181  CREATININE 1.99*   Estimated Creatinine Clearance: 31.6 mL/min (by C-G formula based on Cr of 1.99). No results for input(s): VANCOTROUGH, VANCOPEAK, VANCORANDOM, GENTTROUGH, GENTPEAK, GENTRANDOM, TOBRATROUGH, TOBRAPEAK, TOBRARND, AMIKACINPEAK, AMIKACINTROU, AMIKACIN in the last 72 hours.   Assessment: 74 yo male presenting with fever, soreness in BL arms and thighs  PMH: HLD, PVD, HTN, TIA x 2,   ID: abx for cap. WBC 6.8, Lactate 2.98, Tmax 101.5  Ceftriaxone 10/18>> Azithromycin 10/18>>  Blood x 2 10/18  Renal: SCr 1.99  Plan:  Ceftriaxone 1 gm IV q24 h x 1 week Azithromycin 500 mg IV q24h x 1 week Monitor clinical status, culture data  Pharmacy will sign-off but are available as needed  Levester Fresh, PharmD, BCPS Clinical Pharmacist Pager [phone] 03/17/2015 12:11 PM

## 2015-03-17 NOTE — Progress Notes (Signed)
Utilization review completed. Abbigael Detlefsen, RN, BSN. 

## 2015-03-17 NOTE — Progress Notes (Signed)
PHARMACIST - PHYSICIAN ORDER COMMUNICATION  CONCERNING: P&T Medication Policy on Herbal Medications  DESCRIPTION:  This patient's order for:  Melatonin  has been noted.  This product(s) is classified as an "herbal" or natural product. Due to a lack of definitive safety studies or FDA approval, nonstandard manufacturing practices, plus the potential risk of unknown drug-drug interactions while on inpatient medications, the Pharmacy and Therapeutics Committee does not permit the use of "herbal" or natural products of this type within [facility].   ACTION TAKEN: The pharmacy department is unable to verify this order at this time and your patient has been informed of this safety policy. Please reevaluate patient's clinical condition at discharge and address if the herbal or natural product(s) should be resumed at that time.   Gracy Bruins, PharmD Clinical Pharmacist [facility]

## 2015-03-17 NOTE — ED Notes (Addendum)
To ED via [facility] medic 38 with c/o shaking chills/fever, started at 0430. Pt lives with wife. Denies any cold/cough, denies nausea/vomiting/diarrhea. Has taken 800 mg of tylenol enroute per EMS.

## 2015-03-17 NOTE — ED Notes (Signed)
Pt used bedside commode- had formed bowel movement, no difficulty transferring to bsc.

## 2015-03-17 NOTE — H&P (Signed)
[facility] History and Physical                                                                                    Kevin [name], is a 74 y.o. male  MRN: [id number]   DOB - 1941-03-08  Admit Date - 03/17/2015  Outpatient Primary MD for the patient is Garret Reddish, MD  Referring MD: Alfonse Spruce / ER  With History of -  Past Medical History  Diagnosis Date  . Hyperlipidemia     takes Fenofibrate daily  . Peripheral vascular disease ([place])   . 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     . Arthritis     DDD- Lower Back  . Hypertension     takes Amlodipine,Lisinopril,and Ziac daily  . Muscle spasm     takes Zanaflex daily as needed  . Insomnia     takes Melatonin nightly  . Pneumonia     hx of   . TIA (transient ischemic attack)     x 2;takes Plavix daily  . Rheumatoid arthritis ([place])   . Back pain     stenosis  . Joint pain   . Joint swelling   . History of colon polyps   . Diverticulosis   . Urinary frequency     takes Flomax daily  . Urinary urgency   . History of blood transfusion     no abnormal reaction noted  . Cataracts, bilateral     immature  . Rheumatic fever     as a child  . Bradycardia       Past Surgical History  Procedure Laterality Date  . Abdominal aortic aneurysm repair  2010  . Carotid endarterectomy  09/20/2004    Left  CEA  . Tonsillectomy    . Colonoscopy    . Upper gi endoscopy    . Inguinal hernia repair Left 02/05/2013    Procedure: HERNIA REPAIR INGUINAL ADULT;  Surgeon: Joyice Faster. Cornett, MD;  Location: [place];  Service: General;  Laterality: Left;  . Insertion of mesh Left 02/05/2013    Procedure: INSERTION OF MESH;  Surgeon: Joyice Faster. Cornett, MD;  Location: [place];  Service: General;  Laterality: Left;  . Testiclar cyst excision    . Vasectomy    . Hand surgery Right   . Shoulder arthroscopy with rotator cuff repair and subacromial decompression Left 05/01/2014   Procedure: LEFT ARTHROSCOPY SHOULDER SUBACROMIAL DECOMPRESSION,DISTAL CLAVICAL RESECTION AND ROTATOR CUFF REPAIR;  Surgeon: Marin Shutter, MD;  Location: [place];  Service: Orthopedics;  Laterality: Left;    in for   Chief Complaint  Patient presents with  . Fever     HPI This is a 74 yo male patient with history dyslipidemia, PVD, prior CVA, chronic low back pain secondary to degenerative disc disease, hypertension and BPH. Patient was in his usual state of health until early this morning around 4:30 AM when he awakened with chills, myalgias and rigors. He's had no other symptoms such as nausea, vomiting or diarrhea. He has not been short of breath. He has not had a cough. He has not had  any sick contacts. He did not take an influenza vaccine this year. He felt cold so went back into bed and covered himself with blankets. He told his wife about 9 AM this morning that he felt sick and was having chills; she promptly called EMS. Patient with subsequent transported to the hospital.  Upon arrival to the ER the patient was found to have a temperature of 103.66F, he was normotensive with a BP of 152/56 and a heart rate of 75. His room air saturations were 92%. Chest x-ray revealed subtle right basilar opacity concerning for pneumonia. Laboratory data revealed BUN 29 creatinine 1.99 glucose 153, alkaline phosphatase low at 23, lactic acid elevated at 2.98, white count was normal at 6800, hemoglobin was 12.4. Because of presentation with elevated temperature, abnormal chest x-ray and elevated lactic acid patient was given fluid challenges and started on empiric antibodies to cover community-acquired pneumonia. Patient has not been hypoxemic since arrival to the ER.   Review of Systems   In addition to the HPI above,  No Headache, changes with Vision or hearing, new weakness, tingling, numbness in any extremity, No problems swallowing food or Liquids, indigestion/reflux No Chest pain, Cough or Shortness  of Breath, palpitations, orthopnea or DOE No Abdominal pain, N/V; no melena or hematochezia, no dark tarry stools, Bowel movements are regular, No dysuria, hematuria or flank pain No new skin rashes, lesions, masses or bruises, No recent weight gain or loss No polyuria, polydypsia or polyphagia,  *A full 10 point Review of Systems was done, except as stated above, all other Review of Systems were negative.  Social History Social History  Substance Use Topics  . Smoking status: Former Smoker -- 2.00 packs/day - quit age ~74 yrs old    Types: Cigarettes  . Smokeless tobacco: Never Used     Comment: quit smoking 25+yrs ago  . Alcohol Use: No    Resides at: Private residence  Lives with: Spouse  Ambulatory status: Without assistive devices   Family History Family History  Problem Relation Age of Onset  . Parkinsonism Father   . Dementia Father   . Cancer Father     Throat  . Colon cancer Mother     died in 85s  . Cancer Mother     Colon     Prior to Admission medications   Medication Sig Start Date End Date Taking? Authorizing Provider  amLODipine (NORVASC) 10 MG tablet Take 1 tablet (10 mg total) by mouth daily. 12/10/14   Marin Olp, MD  atorvastatin (LIPITOR) 20 MG tablet Take 1 tablet (20 mg total) by mouth daily. 12/10/14   Marin Olp, MD  bisoprolol-hydrochlorothiazide [facility]) 10-6.25 MG per tablet Take 1 tablet by mouth 2 (two) times daily. 11/10/14   Marin Olp, MD  clopidogrel (PLAVIX) 75 MG tablet Take 1 tablet (75 mg total) by mouth daily. 12/10/14   Marin Olp, MD  diazepam (VALIUM) 5 MG tablet Take 0.5-1 tablets (2.5-5 mg total) by mouth every 6 (six) hours as needed for muscle spasms or sedation. 02/06/15   Marin Olp, MD  fenofibrate (TRICOR) 145 MG tablet Take 1 tablet by mouth  daily 03/16/15   Marin Olp, MD  gabapentin (NEURONTIN) 300 MG capsule Take 1 capsule (300 mg total) by mouth 2 (two) times daily. 02/09/15   Marin Olp, MD  loratadine (CLARITIN) 10 MG tablet Take 10 mg by mouth daily as needed for allergies (congestion).    Historical Provider,  MD  Melatonin 3 MG TABS Take 3 mg by mouth at bedtime.     Historical Provider, MD  oxyCODONE-acetaminophen (PERCOCET) 7.5-325 MG per tablet Take 1 tablet by mouth every 4 (four) hours as needed for severe pain.    Historical Provider, MD  tamsulosin (FLOMAX) 0.4 MG CAPS capsule Take 1 capsule (0.4 mg total) by mouth daily. 12/10/14   Marin Olp, MD  tiZANidine (ZANAFLEX) 4 MG tablet Take 1 tablet by mouth  every 6 hours as needed for muscle spasm(s) 02/20/15   Marin Olp, MD    No Known Allergies  Physical Exam  Vitals  Blood pressure 119/56, pulse 69, temperature 101.5 F (38.6 C), temperature source Oral, resp. rate 16, height 5\' 9"  (1.753 m), weight 180 lb (81.647 kg), SpO2 94 %.   General:  In no acute distress, appears healthy and well nourished  Psych:  Normal affect, Denies Suicidal or Homicidal ideations, Awake Alert, Oriented X 3. Speech and thought patterns are clear and appropriate, no apparent short term memory deficits  Neuro:   No focal neurological deficits, CN II through XII intact, Strength 5/5 all 4 extremities, Sensation intact all 4 extremities.  ENT:  Ears and Eyes appear Normal, Conjunctivae clear, PER. Moist oral mucosa without erythema or exudates.  Neck:  Supple, No lymphadenopathy appreciated  Respiratory:  Symmetrical chest wall movement, Good air movement bilaterally, CTAB except for mild expiratory crackles right base. Room Air  Cardiac:  RRR, No Murmurs, no LE edema noted, no JVD, No carotid bruits, peripheral pulses palpable at 2+  Abdomen:  Positive bowel sounds, Soft, Non tender, Non distended,  No masses appreciated, no obvious hepatosplenomegaly  Skin:  No Cyanosis, Normal Skin Turgor, No Skin Rash or Bruise.  Extremities: Symmetrical without obvious trauma or injury,  no effusions.  Data  Review  CBC  Recent Labs Lab 03/17/15 1110  WBC 6.8  HGB 12.4*  HCT 36.6*  PLT 181  MCV 89.7  MCH 30.4  MCHC 33.9  RDW 13.3  LYMPHSABS 0.8  MONOABS 0.3  EOSABS 0.1  BASOSABS 0.0    Chemistries   Recent Labs Lab 03/17/15 1110  NA 139  K 3.9  CL 104  CO2 23  GLUCOSE 153*  BUN 29*  CREATININE 1.99*  CALCIUM 9.2  AST 35  ALT 23  ALKPHOS 23*  BILITOT 0.7    estimated creatinine clearance is 31.6 mL/min (by C-G formula based on Cr of 1.99).  No results for input(s): TSH, T4TOTAL, T3FREE, THYROIDAB in the last 72 hours.  Invalid input(s): FREET3  Coagulation profile No results for input(s): INR, PROTIME in the last 168 hours.  No results for input(s): DDIMER in the last 72 hours.  Cardiac Enzymes No results for input(s): CKMB, TROPONINI, MYOGLOBIN in the last 168 hours.  Invalid input(s): CK  Invalid input(s): POCBNP  Urinalysis    Component Value Date/Time   COLORURINE YELLOW 10/18/2014 1130   APPEARANCEUR CLEAR 10/18/2014 1130   LABSPEC 1.013 10/18/2014 1130   PHURINE 5.5 10/18/2014 1130   GLUCOSEU NEGATIVE 10/18/2014 1130   GLUCOSEU NEGATIVE 09/24/2010 0758   HGBUR NEGATIVE 10/18/2014 1130   HGBUR negative 11/20/2008 0833   BILIRUBINUR NEGATIVE 10/18/2014 1130   BILIRUBINUR n 09/26/2011 1046   KETONESUR NEGATIVE 10/18/2014 1130   PROTEINUR NEGATIVE 10/18/2014 1130   PROTEINUR n 09/26/2011 1046   UROBILINOGEN 0.2 10/18/2014 1130   UROBILINOGEN 0.2 09/26/2011 1046   NITRITE NEGATIVE 10/18/2014 1130   NITRITE n 09/26/2011 1046  LEUKOCYTESUR NEGATIVE 10/18/2014 1130    Imaging results:   Dg Chest 2 View  03/17/2015  CLINICAL DATA:  Fever. EXAM: CHEST  2 VIEW COMPARISON:  Oct 18, 2014. FINDINGS: The heart size and mediastinal contours are within normal limits. No pneumothorax or pleural effusion is noted. Left lung is clear. New mild right basilar opacity is noted concerning for pneumonia or subsegmental atelectasis. Multilevel degenerative  disc disease is noted in the thoracic spine. IMPRESSION: Mild right basilar opacity is noted concerning for pneumonia or subsegmental atelectasis. Follow-up radiographs are recommended to ensure resolution. Electronically Signed   By: Marijo Conception, M.D.   On: 03/17/2015 11:47     EKG: (Independently reviewed) normal sinus rhythm with ventricular rate 72 bpm, QTC 460 ms, no ST segment or T-wave changes concerning for ischemia   Assessment & Plan  Principal Problem:   CAP (community acquired pneumonia) -Admit to medical floor -Continue empiric antibiotic therapy with Rocephin and Zithromax -Follow up on blood cultures; obtain sputum culture -Check urinary Legionella and strep as well as HIV per protocol -Continue supportive care with pulmonary toileting -Rule out influenza: Check PCR panel and initiate droplet precautions -Have oxygen available if O2 saturations drop below 92%  Active Problems:   CKD (chronic kidney disease), stage III -Renal function stable and at baseline    Elevated lactic acid level -Likely related to recent fever and associated dehydration -Has received 2500 mL of fluid in the ER -Continue IV fluids at 125 mL per hour -Cycle lactic acid for an additional 2 collections -Patient is normotensive with MAP greater than 65 and lactic acid is less than 4 so at this point does not appear to be consistent with sepsis but is consistent with acute inflammatory process    Essential hypertension -Continue preadmission medication of bisoprolol but at 1/2 preadmit dose due to suboptimal BP in rechecks -Holding thiazide diuretic and Norvasc.    Chronic pain syndrome -Continue preadmission medications of Neurontin and Percocet as well as Zanaflex    History of CVA (cerebrovascular accident) -Continue Plavix    Hyperlipidemia -Continue Lipitor and fenofibrate    Essential tremor -Stable with minimal tremor noticed upon exam    BPH (benign prostatic  hyperplasia) -Patient reports chronic issues with urinary frequency -Continue Flomax    DVT Prophylaxis: Lovenox  Family Communication:   Daughter at bedside  Code Status:  Full code  Condition:  Stable  Discharge disposition: Anticipate discharge back to home environment in next 24-36 hours pending improvement in in resolution of pneumonia symptomatology  Time spent in minutes : 60      Xavyer Steenson L. ANP on 03/17/2015 at 12:38 PM  Between 7am to 7pm - Pager - [phone]  After 7pm go to [email or web address] - password [email or web address]  And look for the night coverage person covering me after hours  [facility]

## 2015-03-17 NOTE — ED Provider Notes (Signed)
CSN: [id number]     Arrival date & time 03/17/15  1029 History   First MD Initiated Contact with Patient 03/17/15 1030     Chief Complaint  Patient presents with  . Fever     (Consider location/radiation/quality/duration/timing/severity/associated sxs/prior Treatment) HPI Kevin [name] is a 74 y.o. male with extensive past medical history, presents to emergency department complaining of fever and shakes. Patient states he woke up around 4:30 this morning, states he had shaking of bilateral arms and soreness in his thighs. He states he did not tell his wife until 9 AM this morning. Patient denies any other symptoms. He denies any cough or congestion. No sore throat. No nausea or vomiting. He denies any chest pain, abdominal pain, back pain. Denies any urinary symptoms. Denies any diarrhea. Patient was attempted to give Tylenol,   Past Medical History  Diagnosis Date  . Hyperlipidemia     takes Fenofibrate daily  . Peripheral vascular disease ([place])   . 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     . Arthritis     DDD- Lower Back  . Hypertension     takes Amlodipine,Lisinopril,and Ziac daily  . Muscle spasm     takes Zanaflex daily as needed  . Insomnia     takes Melatonin nightly  . Pneumonia     hx of   . TIA (transient ischemic attack)     x 2;takes Plavix daily  . Rheumatoid arthritis ([place])   . Back pain     stenosis  . Joint pain   . Joint swelling   . History of colon polyps   . Diverticulosis   . Urinary frequency     takes Flomax daily  . Urinary urgency   . History of blood transfusion     no abnormal reaction noted  . Cataracts, bilateral     immature  . Rheumatic fever     as a child  . Bradycardia    Past Surgical History  Procedure Laterality Date  . Abdominal aortic aneurysm repair  2010  . Carotid endarterectomy  09/20/2004    Left  CEA  . Tonsillectomy    . Colonoscopy    . Upper gi endoscopy    . Inguinal hernia repair Left  02/05/2013    Procedure: HERNIA REPAIR INGUINAL ADULT;  Surgeon: Joyice Faster. Cornett, MD;  Location: [place];  Service: General;  Laterality: Left;  . Insertion of mesh Left 02/05/2013    Procedure: INSERTION OF MESH;  Surgeon: Joyice Faster. Cornett, MD;  Location: [place];  Service: General;  Laterality: Left;  . Testiclar cyst excision    . Vasectomy    . Hand surgery Right   . Shoulder arthroscopy with rotator cuff repair and subacromial decompression Left 05/01/2014    Procedure: LEFT ARTHROSCOPY SHOULDER SUBACROMIAL DECOMPRESSION,DISTAL CLAVICAL RESECTION AND ROTATOR CUFF REPAIR;  Surgeon: Marin Shutter, MD;  Location: [place];  Service: Orthopedics;  Laterality: Left;   Family History  Problem Relation Age of Onset  . Parkinsonism Father   . Dementia Father   . Cancer Father     Throat  . Colon cancer Mother     died in 56s  . Cancer Mother     Colon   Social History  Substance Use Topics  . Smoking status: Former Smoker -- 2.00 packs/day for 40 years    Types: Cigarettes  . Smokeless tobacco: Never Used     Comment:  quit smoking 25+yrs ago  . Alcohol Use: No    Review of Systems  Constitutional: Positive for fever and chills.  Respiratory: Negative for cough, chest tightness and shortness of breath.   Cardiovascular: Negative for chest pain, palpitations and leg swelling.  Gastrointestinal: Negative for nausea, vomiting, abdominal pain, diarrhea and abdominal distention.  Genitourinary: Negative for dysuria, urgency, frequency and hematuria.  Musculoskeletal: Negative for myalgias, arthralgias, neck pain and neck stiffness.  Skin: Negative for rash.  Allergic/Immunologic: Negative for immunocompromised state.  Neurological: Negative for dizziness, weakness, light-headedness, numbness and headaches.      Allergies  Review of patient's allergies indicates no known allergies.  Home Medications   Prior to Admission medications   Medication  Sig Start Date End Date Taking? Authorizing Provider  amLODipine (NORVASC) 10 MG tablet Take 1 tablet (10 mg total) by mouth daily. 12/10/14   Marin Olp, MD  atorvastatin (LIPITOR) 20 MG tablet Take 1 tablet (20 mg total) by mouth daily. 12/10/14   Marin Olp, MD  bisoprolol-hydrochlorothiazide [facility]) 10-6.25 MG per tablet Take 1 tablet by mouth 2 (two) times daily. 11/10/14   Marin Olp, MD  clopidogrel (PLAVIX) 75 MG tablet Take 1 tablet (75 mg total) by mouth daily. 12/10/14   Marin Olp, MD  diazepam (VALIUM) 5 MG tablet Take 0.5-1 tablets (2.5-5 mg total) by mouth every 6 (six) hours as needed for muscle spasms or sedation. 02/06/15   Marin Olp, MD  fenofibrate (TRICOR) 145 MG tablet Take 1 tablet by mouth  daily 03/16/15   Marin Olp, MD  gabapentin (NEURONTIN) 300 MG capsule Take 1 capsule (300 mg total) by mouth 2 (two) times daily. 02/09/15   Marin Olp, MD  loratadine (CLARITIN) 10 MG tablet Take 10 mg by mouth daily as needed for allergies (congestion).    Historical Provider, MD  Melatonin 3 MG TABS Take 3 mg by mouth at bedtime.     Historical Provider, MD  oxyCODONE-acetaminophen (PERCOCET) 7.5-325 MG per tablet Take 1 tablet by mouth every 4 (four) hours as needed for severe pain.    Historical Provider, MD  tamsulosin (FLOMAX) 0.4 MG CAPS capsule Take 1 capsule (0.4 mg total) by mouth daily. 12/10/14   Marin Olp, MD  tiZANidine (ZANAFLEX) 4 MG tablet Take 1 tablet by mouth  every 6 hours as needed for muscle spasm(s) 02/20/15   Marin Olp, MD   BP 152/56 mmHg  Pulse 75  Temp(Src) 103.1 F (39.5 C) (Oral)  Ht 5\' 9"  (1.753 m)  Wt 180 lb (81.647 kg)  BMI 26.57 kg/m2  SpO2 92% Physical Exam  Constitutional: He is oriented to person, place, and time. He appears well-developed and well-nourished. No distress.  HENT:  Head: Normocephalic and atraumatic.  Right Ear: External ear normal.  Left Ear: External ear normal.  Nose: Nose  normal.  Mouth/Throat: Oropharynx is clear and moist.  Eyes: Conjunctivae are normal.  Neck: Normal range of motion. Neck supple.  No meningismus  Cardiovascular: Normal rate, regular rhythm and normal heart sounds.   Pulmonary/Chest: Effort normal. No respiratory distress. He has no wheezes. He has no rales.  Abdominal: Soft. Bowel sounds are normal. He exhibits no distension. There is no tenderness. There is no rebound.  Musculoskeletal: He exhibits no edema.  Neurological: He is alert and oriented to person, place, and time. No cranial nerve deficit. Coordination normal.  Skin: Skin is warm and dry.  Nursing note and vitals reviewed.  ED Course  Procedures (including critical care time) Labs Review Labs Reviewed  CBC WITH DIFFERENTIAL/PLATELET - Abnormal; Notable for the following:    RBC 4.08 (*)    Hemoglobin 12.4 (*)    HCT 36.6 (*)    All other components within normal limits  COMPREHENSIVE METABOLIC PANEL - Abnormal; Notable for the following:    Glucose, Bld 153 (*)    BUN 29 (*)    Creatinine, Ser 1.99 (*)    Total Protein 6.4 (*)    Alkaline Phosphatase 23 (*)    GFR calc non Af Amer 31 (*)    GFR calc Af Amer 36 (*)    All other components within normal limits  I-STAT CG4 LACTIC ACID, ED - Abnormal; Notable for the following:    Lactic Acid, Venous 2.98 (*)    All other components within normal limits  CULTURE, BLOOD (ROUTINE X 2)  CULTURE, BLOOD (ROUTINE X 2)  CULTURE, EXPECTORATED SPUTUM-ASSESSMENT  GRAM STAIN  INFLUENZA PANEL BY PCR (TYPE A & B, H1N1)  PROCALCITONIN  URINALYSIS, ROUTINE W REFLEX MICROSCOPIC (NOT AT [facility])  STREP PNEUMONIAE URINARY ANTIGEN  LEGIONELLA PNEUMOPHILA SEROGP 1 UR AG  HIV ANTIBODY (ROUTINE TESTING)  BASIC METABOLIC PANEL  CBC WITH DIFFERENTIAL/PLATELET  I-STAT CG4 LACTIC ACID, ED  I-STAT CG4 LACTIC ACID, ED  I-STAT CG4 LACTIC ACID, ED    Imaging Review Dg Chest 2 View  03/17/2015  CLINICAL DATA:  Fever. EXAM: CHEST  2  VIEW COMPARISON:  Oct 18, 2014. FINDINGS: The heart size and mediastinal contours are within normal limits. No pneumothorax or pleural effusion is noted. Left lung is clear. New mild right basilar opacity is noted concerning for pneumonia or subsegmental atelectasis. Multilevel degenerative disc disease is noted in the thoracic spine. IMPRESSION: Mild right basilar opacity is noted concerning for pneumonia or subsegmental atelectasis. Follow-up radiographs are recommended to ensure resolution. Electronically Signed   By: Marijo Conception, M.D.   On: 03/17/2015 11:47   I have personally reviewed and evaluated these images and lab results as part of my medical decision-making.   EKG Interpretation None      MDM   Final diagnoses:  CAP (community acquired pneumonia)   Pt with fever, no other complaints. VS normal other than temp of 103. No clear source of infection. Will get labs, CXR, UA  Pt's temp improved with motrin. He continues not to have any complaints. Lactic acid slightly elevated. Concerning for possible sepsis. Fluids are running, will add antibiotic. Possible pneumonia on chest x-ray. Antibiotic for CAP. Will call hospitalist for admission. Vital signs remained stable. Patient doing better.  Filed Vitals:   03/17/15 1300 03/17/15 1400 03/17/15 1438 03/17/15 1453  BP: 127/47 134/49  131/67  Pulse: 67 65  64  Temp:   99.5 F (37.5 C) 99.6 F (37.6 C)  TempSrc:   Oral Oral  Resp: 21 19  20   Height:    5\' 9"  (1.753 m)  Weight:    190 lb 7.6 oz (86.4 kg)  SpO2: 94% 92%  95%      Jeannett Senior, PA-C 03/17/15 1725  Harvel Quale, MD 03/17/15 2142

## 2015-03-18 ENCOUNTER — Telehealth: Payer: Self-pay | Admitting: Family Medicine

## 2015-03-18 ENCOUNTER — Inpatient Hospital Stay (HOSPITAL_COMMUNITY): Payer: Medicare Other

## 2015-03-18 DIAGNOSIS — N183 Chronic kidney disease, stage 3 (moderate): Secondary | ICD-10-CM

## 2015-03-18 DIAGNOSIS — J189 Pneumonia, unspecified organism: Principal | ICD-10-CM

## 2015-03-18 DIAGNOSIS — E872 Acidosis: Secondary | ICD-10-CM

## 2015-03-18 DIAGNOSIS — G894 Chronic pain syndrome: Secondary | ICD-10-CM

## 2015-03-18 DIAGNOSIS — N400 Enlarged prostate without lower urinary tract symptoms: Secondary | ICD-10-CM

## 2015-03-18 LAB — BASIC METABOLIC PANEL
ANION GAP: 10 (ref 5–15)
BUN: 22 mg/dL — ABNORMAL HIGH (ref 6–20)
CALCIUM: 8.3 mg/dL — AB (ref 8.9–10.3)
CO2: 23 mmol/L (ref 22–32)
Chloride: 108 mmol/L (ref 101–111)
Creatinine, Ser: 1.77 mg/dL — ABNORMAL HIGH (ref 0.61–1.24)
GFR calc non Af Amer: 36 mL/min — ABNORMAL LOW (ref >60)
GFR, EST AFRICAN AMERICAN: 41 mL/min — AB (ref >60)
GLUCOSE: 105 mg/dL — AB (ref 65–99)
POTASSIUM: 3.8 mmol/L (ref 3.5–5.1)
Sodium: 141 mmol/L (ref 135–145)

## 2015-03-18 LAB — CBC WITH DIFFERENTIAL/PLATELET
BASOS PCT: 0 %
Basophils Absolute: 0 10*3/uL (ref 0.0–0.1)
Eosinophils Absolute: 0.1 10*3/uL (ref 0.0–0.7)
Eosinophils Relative: 2 %
HEMATOCRIT: 29.2 % — AB (ref 39.0–52.0)
HEMOGLOBIN: 9.9 g/dL — AB (ref 13.0–17.0)
LYMPHS ABS: 1.5 10*3/uL (ref 0.7–4.0)
LYMPHS PCT: 16 %
MCH: 30.7 pg (ref 26.0–34.0)
MCHC: 33.9 g/dL (ref 30.0–36.0)
MCV: 90.4 fL (ref 78.0–100.0)
MONO ABS: 0.6 10*3/uL (ref 0.1–1.0)
MONOS PCT: 7 %
NEUTROS ABS: 7.1 10*3/uL (ref 1.7–7.7)
NEUTROS PCT: 76 %
Platelets: 141 10*3/uL — ABNORMAL LOW (ref 150–400)
RBC: 3.23 MIL/uL — ABNORMAL LOW (ref 4.22–5.81)
RDW: 13.8 % (ref 11.5–15.5)
WBC: 9.4 10*3/uL (ref 4.0–10.5)

## 2015-03-18 LAB — HIV ANTIBODY (ROUTINE TESTING W REFLEX): HIV SCREEN 4TH GENERATION: NONREACTIVE

## 2015-03-18 MED ORDER — LEVOFLOXACIN 750 MG PO TABS
750.0000 mg | ORAL_TABLET | Freq: Once | ORAL | Status: AC
  Administered 2015-03-18: 750 mg via ORAL
  Filled 2015-03-18: qty 1

## 2015-03-18 MED ORDER — LEVOFLOXACIN 250 MG PO TABS
250.0000 mg | ORAL_TABLET | Freq: Every day | ORAL | Status: DC
Start: 2015-03-19 — End: 2015-04-20

## 2015-03-18 MED ORDER — BISOPROLOL FUMARATE 10 MG PO TABS: 10.0000 mg | ORAL_TABLET | Freq: Every day | ORAL | Status: DC

## 2015-03-18 MED ORDER — LEVOFLOXACIN 500 MG PO TABS
250.0000 mg | ORAL_TABLET | Freq: Every day | ORAL | Status: DC
Start: 2015-03-19 — End: 2015-03-18

## 2015-03-18 NOTE — Progress Notes (Signed)
Patient agreed to be followed by [facility] for PNA after DC through [facility]. Order placed by CM

## 2015-03-18 NOTE — Telephone Encounter (Signed)
See below

## 2015-03-18 NOTE — Telephone Encounter (Signed)
Pt wife made the appt for 03-24-15 at 215 pm

## 2015-03-18 NOTE — Discharge Summary (Signed)
Physician Discharge Summary   Patient ID: THEOPHILUS WILDEBOER MRN: [id number] DOB/AGE: 02-Jun-1940 74 y.o.  Admit date: 03/17/2015 Discharge date: 03/18/2015  Primary Care Physician:  Garret Reddish, MD  Discharge Diagnoses:      Community-acquired pneumonia    Dehydration    Elevated lactic acid level    Acute on chronic kidney disease stage III  . Hyperlipidemia . Essential tremor . Essential hypertension . BPH (benign prostatic hyperplasia) . Chronic pain syndrome   Consults:  None   Recommendations for Outpatient Follow-up:  Please follow patient, will need chest x-ray in 2-3 weeks to ensure complete resolution of pneumonia  Please note HCTZ was discontinued from the combination pill of the supra [place]/HCTZ, follow BMET  TESTS THAT NEED FOLLOW-UP BMET   DIET: Heart healthy diet    Allergies:  No Known Allergies   Discharge Medications:   Medication List    STOP taking these medications        bisoprolol-hydrochlorothiazide 10-6.25 MG tablet  Commonly known as:  ZIAC  Replaced by:  bisoprolol 10 MG tablet      TAKE these medications        amLODipine 10 MG tablet  Commonly known as:  NORVASC  Take 1 tablet (10 mg total) by mouth daily.     atorvastatin 20 MG tablet  Commonly known as:  LIPITOR  Take 1 tablet (20 mg total) by mouth daily.     bisoprolol 10 MG tablet  Commonly known as:  ZEBETA  Take 1 tablet (10 mg total) by mouth daily.  Start taking on:  03/19/2015     clopidogrel 75 MG tablet  Commonly known as:  PLAVIX  Take 1 tablet (75 mg total) by mouth daily.     diazepam 5 MG tablet  Commonly known as:  VALIUM  Take 0.5-1 tablets (2.5-5 mg total) by mouth every 6 (six) hours as needed for muscle spasms or sedation.     fenofibrate 145 MG tablet  Commonly known as:  TRICOR  Take 1 tablet by mouth  daily     gabapentin 300 MG capsule  Commonly known as:  NEURONTIN  Take 300 mg by mouth 2 (two) times daily.     levofloxacin 250 MG  tablet  Commonly known as:  LEVAQUIN  Take 1 tablet (250 mg total) by mouth daily. X 6 more days  Start taking on:  03/19/2015     loratadine 10 MG tablet  Commonly known as:  CLARITIN  Take 10 mg by mouth daily as needed for allergies (congestion).     Melatonin 3 MG Tabs  Take 3 mg by mouth at bedtime.     methocarbamol 500 MG tablet  Commonly known as:  ROBAXIN  Take 500 mg by mouth 2 (two) times daily.     oxyCODONE-acetaminophen 7.5-325 MG tablet  Commonly known as:  PERCOCET  Take 1 tablet by mouth 3 (three) times daily.     tamsulosin 0.4 MG Caps capsule  Commonly known as:  FLOMAX  Take 1 capsule (0.4 mg total) by mouth daily.     tiZANidine 4 MG tablet  Commonly known as:  ZANAFLEX  Take 1 tablet by mouth  every 6 hours as needed for muscle spasm(s)     vitamin B-12 1000 MCG tablet  Commonly known as:  CYANOCOBALAMIN  Take 1,000 mcg by mouth daily.     Vitamin D-3 1000 UNITS Caps  Take 1 capsule by mouth daily.  Brief H and P: Per Dr. Tamala Julian on 03/17/15 For complete details please refer to admission H and P, but in brief This is a 74 yo male patient with history dyslipidemia, PVD, prior CVA, chronic low back pain secondary to degenerative disc disease, hypertension and BPH. Patient was in his usual state of health until early this morning around 4:30 AM when he awakened with chills, myalgias and rigors. He's had no other symptoms such as nausea, vomiting or diarrhea. He has not been short of breath. He has not had a cough. He has not had any sick contacts. He did not take an influenza vaccine this year. He felt cold so went back into bed and covered himself with blankets. He told his wife about 9 AM this morning that he felt sick and was having chills; she promptly called EMS. Patient with subsequent transported to the hospital. Upon arrival to the ER the patient was found to have a temperature of 103.69F, he was normotensive with a BP of 152/56 and a heart  rate of 75. His room air saturations were 92%. Chest x-ray revealed subtle right basilar opacity concerning for pneumonia. Laboratory data revealed BUN 29 creatinine 1.99 glucose 153, alkaline phosphatase low at 23, lactic acid elevated at 2.98, white count was normal at 6800, hemoglobin was 12.4. Because of presentation with elevated temperature, abnormal chest x-ray and elevated lactic acid patient was given fluid challenges and started on empiric antibodies to cover community-acquired pneumonia. Patient has not been hypoxemic since arrival to the ER.    Hospital Course:   CAP (community acquired pneumonia) - Patient was admitted to the floor, started on empiric antibiotic therapy with IV Zithromax and Rocephin. Flu PCR panel was negative, urine strep antigen negative, UA negative for UTI. Sputum culture still pending. Patient has significantly improved and requesting to be discharged home. He states that he will follow up with his PCP. Blood cultures have been negative so far. Patient was transitioned to oral antibiotics on Levaquin. Recommend to obtain chest x-ray in 2-3 weeks to ensure complete resolution of pneumonia. The patient is ambulating without any difficulty.   CKD (chronic kidney disease), stage III -Patient presented with creatinine function of 1.9, improved with IV fluids to 1.7. Lactic acid was 2.92 at the time of admission. Patient was placed on IV fluid hydration. Patient is on combination pill of the superolateral/HCTZ. HCTZ was discontinued.    Elevated lactic acid level lactic acid 2.98 at the time of admission:  -Likely related to recent fever and associated dehydration, patient was placed on aggressive IV fluid hydration. Lactic acid normalized to 1.3.   Essential hypertension -Initially Norvasc and HCTZ were held on admission. Patient will continue Norvasc and bisoprolol. HCTZ has been discontinued due to acute renal insufficiency superimposed on CKD   Chronic pain  syndrome -Continue preadmission medications of Neurontin and Percocet as well as Zanaflex   History of CVA (cerebrovascular accident) -Continue Plavix   Hyperlipidemia -Continue Lipitor and fenofibrate   Essential tremor -Stable with minimal tremor noticed upon exam   BPH (benign prostatic hyperplasia) -Patient reports chronic issues with urinary frequency -Continue Flomax   Day of Discharge BP 152/50 mmHg  Pulse 58  Temp(Src) 98.6 F (37 C) (Oral)  Resp 18  Ht 5\' 9"  (1.753 m)  Wt 86.4 kg (190 lb 7.6 oz)  BMI 28.12 kg/m2  SpO2 98%  Physical Exam: General: Alert and awake oriented x3 not in any acute distress. HEENT: anicteric sclera, pupils reactive to light  and accommodation CVS: S1-S2 clear no murmur rubs or gallops Chest: clear to auscultation bilaterally, no wheezing rales or rhonchi Abdomen: soft nontender, nondistended, normal bowel sounds Extremities: no cyanosis, clubbing or edema noted bilaterally Neuro: Cranial nerves II-XII intact, no focal neurological deficits   The results of significant diagnostics from this hospitalization (including imaging, microbiology, ancillary and laboratory) are listed below for reference.    LAB RESULTS: Basic Metabolic Panel:  Recent Labs Lab 03/17/15 1110 03/18/15 0514  NA 139 141  K 3.9 3.8  CL 104 108  CO2 23 23  GLUCOSE 153* 105*  BUN 29* 22*  CREATININE 1.99* 1.77*  CALCIUM 9.2 8.3*   Liver Function Tests:  Recent Labs Lab 03/17/15 1110  AST 35  ALT 23  ALKPHOS 23*  BILITOT 0.7  PROT 6.4*  ALBUMIN 3.7   No results for input(s): LIPASE, AMYLASE in the last 168 hours. No results for input(s): AMMONIA in the last 168 hours. CBC:  Recent Labs Lab 03/17/15 1110 03/18/15 0514  WBC 6.8 9.4  NEUTROABS 5.6 7.1  HGB 12.4* 9.9*  HCT 36.6* 29.2*  MCV 89.7 90.4  PLT 181 141*   Cardiac Enzymes: No results for input(s): CKTOTAL, CKMB, CKMBINDEX, TROPONINI in the last 168 hours. BNP: Invalid  input(s): POCBNP CBG: No results for input(s): GLUCAP in the last 168 hours.  Significant Diagnostic Studies:  Dg Chest 2 View  03/18/2015  CLINICAL DATA:  Community acquired pneumonia.  Shortness of breath. EXAM: CHEST  2 VIEW COMPARISON:  03/17/2015 FINDINGS: Cardiomediastinal silhouette is within normal limits. Basilar right lower lobe opacity is unchanged. Calcified granuloma in the right upper lobe is unchanged. Left lung is clear. No pleural effusion or pneumothorax is identified. Thoracic spondylosis is noted. IMPRESSION: Unchanged right lower lobe pneumonia. Electronically Signed   By: Logan Bores M.D.   On: 03/18/2015 08:11   Dg Chest 2 View  03/17/2015  CLINICAL DATA:  Fever. EXAM: CHEST  2 VIEW COMPARISON:  Oct 18, 2014. FINDINGS: The heart size and mediastinal contours are within normal limits. No pneumothorax or pleural effusion is noted. Left lung is clear. New mild right basilar opacity is noted concerning for pneumonia or subsegmental atelectasis. Multilevel degenerative disc disease is noted in the thoracic spine. IMPRESSION: Mild right basilar opacity is noted concerning for pneumonia or subsegmental atelectasis. Follow-up radiographs are recommended to ensure resolution. Electronically Signed   By: Marijo Conception, M.D.   On: 03/17/2015 11:47    2D ECHO:   Disposition and Follow-up: Discharge Instructions    Diet - low sodium heart healthy    Complete by:  As directed      Discharge instructions    Complete by:  As directed   Please note that your combination pill of bisoprolol/ Hydrochlorothiazide for BP has been stopped due to your kidney function. You are given new prescription for bisprolol.     Increase activity slowly    Complete by:  As directed             DISPOSITION: Home    DISCHARGE FOLLOW-UP Follow-up Information    Follow up with Garret Reddish, MD. Schedule an appointment as soon as possible for a visit on 03/27/2015.   Specialty:  Family  Medicine   Why:  for hospital follow-up, obtain labs for BMET/kidney fynction// Doctor office will call patient at home with [facility] information:   [address] [place] [address] [phone]  Time spent on Discharge: 25 mins   Signed:   Hines Kloss M.D. [facility] 03/18/2015, 11:59 AM Pager: [phone]

## 2015-03-18 NOTE — Discharge Instructions (Signed)

## 2015-03-18 NOTE — Progress Notes (Signed)
Patient discharge teaching given, including activity, diet, follow-up appoints, and medications. Patient verbalized understanding of all discharge instructions. IV access was d/c'd. Vitals are stable. Skin is intact except as charted in most recent assessments. Pt to be escorted out by NT, to be driven home by family. 

## 2015-03-18 NOTE — Telephone Encounter (Signed)
Can he do 2:15 PM on 25th?- that is a 30 minute slot that appears open.

## 2015-03-18 NOTE — Telephone Encounter (Signed)
Pt is being discharge today from [facility] and needs post hospital follow up in 10 days. Can I create 30 min slot w/sda ?

## 2015-03-18 NOTE — Care Management Note (Signed)
Case Management Note  Patient Details  Name: Kevin [name] MRN: [id number] Date of Birth: [birth year]  Subjective/Objective:                Date-10-19 Initial Assessment  Spoke with patient at the bedside Introduced self as case manager and explained role in discharge planning and how to be reached.   Verified patient anticipates to go home with spouse,  at time of discharge and will have  part-time supervision by family. Patient has DME walker. Expressed potential need for no other DME.  Patient denied  needing help with their medication.  Patient drives to MD appointments.  Verified patient has PCP. Patient states they currently receive [facility] services through no one.    Plan: CM will continue to follow for discharge planning and [facility] resources.   Carles Collet RN BSN CM [phone]     Action/Plan:   Expected Discharge Date:                  Expected Discharge Plan:  Home/Self Care  In-House Referral:     Discharge planning Services  CM Consult  Post Acute Care Choice:    Choice offered to:     DME Arranged:    DME Agency:     HH Arranged:    [place] Agency:     Status of Service:  Completed, signed off  Medicare Important Message Given:    Date Medicare IM Given:    Medicare IM give by:    Date Additional Medicare IM Given:    Additional Medicare Important Message give by:     If discussed at [place] of Stay Meetings, dates discussed:    Additional Comments:  Carles Collet, RN 03/18/2015, 11:26 AM

## 2015-03-19 LAB — LEGIONELLA PNEUMOPHILA SEROGP 1 UR AG: L. pneumophila Serogp 1 Ur Ag: NEGATIVE

## 2015-03-20 LAB — CULTURE, BLOOD (ROUTINE X 2)

## 2015-03-22 LAB — CULTURE, BLOOD (ROUTINE X 2): Culture: NO GROWTH

## 2015-03-23 ENCOUNTER — Telehealth: Payer: Self-pay

## 2015-03-23 ENCOUNTER — Other Ambulatory Visit: Payer: Self-pay

## 2015-03-23 NOTE — Patient Outreach (Signed)
[facility]) Care Management  03/23/2015  Kevin [name] 01-Nov-1940 [id number]   Patient triggered RED on [facility] Pneumonia Dashboard, assigned Mariann Laster, RN to outreach for [place] Management services.  Thanks, [name]. [place], [place] Assistant Phone: [phone] Fax: [phone]

## 2015-03-23 NOTE — Telephone Encounter (Signed)
Remind me to address at patinet visit on 10/25

## 2015-03-23 NOTE — Telephone Encounter (Signed)
I received a refill request for Diazepam. Last refilled 02/05/18 for #40 with 2 refills. Last office visit was 12/22/14. Ok to refill this medication?

## 2015-03-23 NOTE — Patient Outreach (Signed)
[facility] Care Management  03/23/2015  DEKEN DORFMAN 10-14-1940 [id number]  [facility] Pneumonia Program Red on [facility] Dashboard - Been to follow-up appt:  No  Outreach call #1 to patient; not reached.  Per Epic D/C summary:  [facility] Follow-up appt scheduled with Garret Reddish, MD.  Patient to schedule an appointment as soon as possible for a visit on 03/27/2015  for hospital follow-up, obtain labs for BMET/kidney function// Doctor office will call patient at home with an appointment RN CM left HIPAA compliant voice message with name and # for call back.  RN CM advised in next follow-up call within 1-2 days.  Mariann Laster, RN, BSN, [facility], CCM  [facility] [employer] Management Coordinator [phone] Direct [phone] Cell [phone] Office [phone] Fax

## 2015-03-24 ENCOUNTER — Encounter: Payer: Self-pay | Admitting: Family Medicine

## 2015-03-24 ENCOUNTER — Ambulatory Visit (INDEPENDENT_AMBULATORY_CARE_PROVIDER_SITE_OTHER): Payer: Medicare Other | Admitting: Family Medicine

## 2015-03-24 VITALS — BP 138/72 | HR 67 | Temp 98.0°F | Wt 190.0 lb

## 2015-03-24 DIAGNOSIS — N183 Chronic kidney disease, stage 3 unspecified: Secondary | ICD-10-CM

## 2015-03-24 DIAGNOSIS — J189 Pneumonia, unspecified organism: Secondary | ICD-10-CM | POA: Diagnosis not present

## 2015-03-24 DIAGNOSIS — I10 Essential (primary) hypertension: Secondary | ICD-10-CM | POA: Diagnosis not present

## 2015-03-24 MED ORDER — BISOPROLOL FUMARATE 10 MG PO TABS: 10.0000 mg | ORAL_TABLET | Freq: Every day | ORAL | Status: DC

## 2015-03-24 NOTE — Assessment & Plan Note (Signed)
S: Creatinine in hospital was near 2. BP was on lower end. HCTZ was stopped. Creatinine improved to 1.77 with hydration which is best he has had in sometime. He has renal follow up in mid november A/P: BP higher than I would like today and occasionally above 140 but hesitate to restart HCTZ without nephrology input given improvement in creatinine with improved hydration

## 2015-03-24 NOTE — Patient Instructions (Signed)
Note for Dr. Moshe Cipro.  In the hospital patient was taken off of hctz 12.5mg  and continued on amlodipine 10mg  and bisoprolol 10mg . Blood pressures at home have been 120s to 130s with rare check above 140. When patient was hospitalized creatinine was around 2 at first but with hydration and off hctz went down to 1.77. Given this improvement, I decided to keep him off the hctz but wanted your opinion as well. In our office blood pressure was 140/64. I did not repeat his kidney function today as he is seeing you within a few weeks.   Come back for chest x-[facility] at [facility] on November 9th   If any new or recurrent or worsening symptoms, return to see [place].

## 2015-03-24 NOTE — Progress Notes (Signed)
Garret Reddish, MD  Subjective:  Kevin [name] is a 74 y.o. year old very pleasant male patient who presents for/with See problem oriented charting ROS- no chest pain. Some shortness of breath with activity but none at rest. No cough/congestion.   Past Medical History-  Patient Active Problem List   Diagnosis Date Noted  . CKD (chronic kidney disease), stage III 01/27/2014    Priority: High  . PVD (peripheral vascular disease) ([place]) 01/23/2014    Priority: High  . AAA (abdominal aortic aneurysm, ruptured) ([place]) 01/17/2013    Priority: High  . History of CVA (cerebrovascular accident) 11/17/2011    Priority: High  . Chronic pain syndrome 12/23/2009    Priority: High  . Personal history of colonic polyps 05/15/2013    Priority: Medium  . Carotid Artery Stenosis L s/p endarterectomy 01/12/2012    Priority: Medium  . Essential tremor 10/05/2009    Priority: Medium  . UNSPECIFIED ANEMIA 12/05/2008    Priority: Medium  . BPH (benign prostatic hyperplasia) 06/04/2007    Priority: Medium  . Fasting hyperglycemia 02/05/2007    Priority: Medium  . Hyperlipidemia 01/03/2007    Priority: Medium  . Essential hypertension 01/03/2007    Priority: Medium  . Family history of malignant neoplasm of gastrointestinal tract 05/15/2013    Priority: Low  . Inguinal hernia 05/01/2012    Priority: Low  . BURSITIS, HIP 12/21/2009    Priority: Low  . ACTINIC KERATOSIS, EAR, LEFT 03/09/2009    Priority: Low  . Left knee Osteoarthritis 11/02/2007    Priority: Low  . 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 CAP (community acquired pneumonia) 03/17/2015  . Elevated lactic acid level 03/17/2015    Medications- reviewed and updated Current Outpatient Prescriptions  Medication Sig Dispense Refill  . amLODipine (NORVASC) 10 MG tablet Take 1 tablet (10 mg total) by mouth daily. 90 tablet 1  . atorvastatin (LIPITOR) 20 MG tablet Take 1 tablet (20 mg total) by mouth daily. 90  tablet 1  . bisoprolol (ZEBETA) 10 MG tablet Take 1 tablet (10 mg total) by mouth daily. 90 tablet 3  . Cholecalciferol (VITAMIN D-3) 1000 UNITS CAPS Take 1 capsule by mouth daily.    . clopidogrel (PLAVIX) 75 MG tablet Take 1 tablet (75 mg total) by mouth daily. 90 tablet 1  . diazepam (VALIUM) 5 MG tablet Take 0.5-1 tablets (2.5-5 mg total) by mouth every 6 (six) hours as needed for muscle spasms or sedation. 40 tablet 2  . fenofibrate (TRICOR) 145 MG tablet Take 1 tablet by mouth  daily 90 tablet 2  . gabapentin (NEURONTIN) 300 MG capsule Take 300 mg by mouth 2 (two) times daily.    Marland Kitchen levofloxacin (LEVAQUIN) 250 MG tablet Take 1 tablet (250 mg total) by mouth daily. X 6 more days 6 tablet 0  . loratadine (CLARITIN) 10 MG tablet Take 10 mg by mouth daily as needed for allergies (congestion).    . Melatonin 3 MG TABS Take 3 mg by mouth at bedtime.     . methocarbamol (ROBAXIN) 500 MG tablet Take 500 mg by mouth 2 (two) times daily.    . tamsulosin (FLOMAX) 0.4 MG CAPS capsule Take 1 capsule (0.4 mg total) by mouth daily. 90 capsule 1  . tiZANidine (ZANAFLEX) 4 MG tablet Take 1 tablet by mouth  every 6 hours as needed for muscle spasm(s) (Patient taking differently: TAKE 2MG  IN THE MORNING AND 8MG  AT BEDTIME) 90 tablet 2  .  vitamin B-12 (CYANOCOBALAMIN) 1000 MCG tablet Take 1,000 mcg by mouth daily.    Marland Kitchen oxyCODONE-acetaminophen (PERCOCET) 7.5-325 MG per tablet Take 1 tablet by mouth 3 (three) times daily.      No current facility-administered medications for this visit.    Objective: BP 138/72 mmHg  Pulse 67  Temp(Src) 98 F (36.7 C)  Wt 190 lb (86.183 kg) Gen: NAD, resting comfortably mucous membrane moist CV: RRR no murmurs rubs or gallops Lungs: CTAB no crackles, wheeze, rhonchi Abdomen: soft/nontender/nondistended/normal bowel sounds. Ext: no edema Skin: warm, dry Neuro: grossly normal, moves all extremities  Assessment/Plan:  CAP (community acquired pneumonia) S: Diagnosed  with pneumonia on 03/17/15 and with elevated lactic acid and concern dehydration he was admitted for 1 day. He improved with antibiotics and hydration and was discharged home. 8 day course of antibiotics with levaquin used outpatient. Still fatigued and with more intense activity mild shortness of breath. Lactic acidosis resolved with hydration.  A/P: clinically improving. No crackles on exam. Repeat CXR in 3 weeks for clearance   CKD (chronic kidney disease), stage III S: Creatinine in hospital was near 2. BP was on lower end. HCTZ was stopped. Creatinine improved to 1.77 with hydration which is best he has had in sometime. He has renal follow up in mid november A/P: BP higher than I would like today and occasionally above 140 but hesitate to restart HCTZ without nephrology input given improvement in creatinine with improved hydration   Essential hypertension S: controlled. On Bisoprolol 10mg , amlodipine 10mg . 120s-130s at home but does run into 140s at times and has seen as high as 150 BP Readings from Last 3 Encounters:  03/24/15 138/72  03/18/15 142/55  12/22/14 152/78  A/P:Continue current meds:  Bisoprolol 10mg , amlodipine 10mg . I really would like BP closer to 130 in office but with creatinine improvement, holding off on adding HCTZ back unless nephrology recommends. Wrote note in AVS for Dr. Moshe Cipro but would like her opinion. Lisinopril not advised with likely renal artery stenosis contributing to CKD  Return precautions advised.   Orders Placed This Encounter  Procedures  . DG Chest 2 View    Standing Status: Future     Number of Occurrences:      Standing Expiration Date: 05/23/2016    Order Specific Question:  Reason for Exam (SYMPTOM  OR DIAGNOSIS REQUIRED)    Answer:  pneumonia follow up for clearance    Order Specific Question:  Preferred imaging location?    Answer:  [facility]    Meds ordered this encounter  Medications  . DISCONTD: bisoprolol (ZEBETA) 10  MG tablet    Sig: Take 1 tablet (10 mg total) by mouth daily.    Dispense:  90 tablet    Refill:  3  . bisoprolol (ZEBETA) 10 MG tablet    Sig: Take 1 tablet (10 mg total) by mouth daily.    Dispense:  90 tablet    Refill:  3

## 2015-03-24 NOTE — Assessment & Plan Note (Signed)
S: controlled. On Bisoprolol 10mg , amlodipine 10mg . 120s-130s at home but does run into 140s at times and has seen as high as 150 BP Readings from Last 3 Encounters:  03/24/15 138/72  03/18/15 142/55  12/22/14 152/78  A/P:Continue current meds:  Bisoprolol 10mg , amlodipine 10mg . I really would like BP closer to 130 in office but with creatinine improvement, holding off on adding HCTZ back unless nephrology recommends. Wrote note in AVS for Dr. Moshe Cipro but would like her opinion. Lisinopril not advised with likely renal artery stenosis contributing to CKD

## 2015-03-24 NOTE — Assessment & Plan Note (Addendum)
S: Diagnosed with pneumonia on 03/17/15 and with elevated lactic acid and concern dehydration he was admitted for 1 day. He improved with antibiotics and hydration and was discharged home. 8 day course of antibiotics with levaquin used outpatient. Still fatigued and with more intense activity mild shortness of breath. Lactic acidosis resolved with hydration.  A/P: clinically improving. No crackles on exam. Repeat CXR in 3 weeks for clearance

## 2015-03-25 ENCOUNTER — Other Ambulatory Visit: Payer: Self-pay

## 2015-03-25 NOTE — Patient Outreach (Addendum)
  [place] [facility]) Care Management  03/25/2015  Kevin [name] 09-20-1940 [id number]   Emmi Pneumonia Program Red on Emmi Dashboard - Been to follow-up appt: No  Outreach call #2 to patient reached.  Per Epic D/C summary: [facility] Follow-up appt scheduled with Garret Reddish, MD.  Patient to schedule an appointment as soon as possible for a visit on 03/27/2015 for hospital follow-up, obtain labs for BMET/kidney function// Doctor office will call patient at home with an appointment  Social:   Patient lives in his home with his wife.   Mobility:  Patient states he is active.  Patient is HOH and has a hearing aide.  Falls:  None    Pain:  Patient states chronic back pain issues and is on a pain management regime.   Caregiver:  Wife Advance Directives: No DME:  Hearing aide, BP cuff  PNA  Patient states he picked up all prescriptions at discharge.  States he took his last Antibiotic tablet yesterday.  Patient completed follow-up appt with Dr. [name] yesterday 03/24/2015.  Patient understands MD will scheduled chest x-ray in 3 weeks to assess for any residual symptoms of PNA.   Patient denies any fever, cough, shortness of breath.  Patient admits to fatigue and understands this symptom may persist for several months.  Patient is pacing activities with rest.   Flu Vaccine:  Refuses due to past reaction "made me sick".  Pneumonia Vaccine:  12/22/14  Consent: Patient agreed to [facility] services.   Plan:   RN CM reviewed symptoms (Zone Tool for PNA).   RN CM advised to report any of these symptoms to MD for follow-up if symptoms present.  RN CM notified [place] Assistant: agreed to services/case opened. RN CM will transition from Emmi PNA program to [facility] Level 3 Program for continued follow-up.  RN CM sent successful outreach letter, [facility] Introductory package and consent for service form.  RN CM will schedule for next contact call within 30 days for Initial  Assessment (Level 3 CM services). RN CM advised to please notify MD of any changes in condition prior to scheduled appt's.   RN CM provided contact name and # [phone] or main office # [phone] and 24-hour nurse line # [phone].  RN CM confirmed patient is aware of 911 services for urgent emergency needs.  Mariann Laster, RN, BSN, [facility], CCM  [employer] Management Coordinator [phone] Direct [phone] Cell [phone] Office [phone] Fax

## 2015-03-26 ENCOUNTER — Other Ambulatory Visit: Payer: Self-pay

## 2015-03-26 NOTE — Patient Outreach (Signed)
[place] [facility]) Care Management  03/26/2015  Kevin [name] November 11, 1940 [id number]   Triggered RED on EMMI Dashboard, notification sent to Mariann Laster, RN.  Thanks, [name]. [name], [place] Assistant Phone: [phone] Fax: [phone]

## 2015-03-26 NOTE — Patient Outreach (Addendum)
[place] [facility]) Care Management  03/26/2015  Kevin [name] June 06, 1940 [id number]  Emmi PNA follow-up and Initial Assessment completed.   Social:  Patient lives in his home with his wife.  Mobility: Patient states he is active. Patient is HOH and has a hearing aide.  Falls: None  Pain: Patient states chronic back pain issues and is on a pain management regime.  Caregiver: Wife Advance Directives: No DME: Hearing aide, BP cuff, scales  PNA Emmi PNA Program  Red on Dashboard  Feeling Better all over? NO Patient states Emmi information incorrect and he continues to feel better daily.  No changes in condition since last contact call with patient on 03/25/15.  Patient completed MD follow-up appointment with Dr. [name] on 20/25/16.  Reports afebrile on office visit.     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 Wrote note in AVS for Dr. Moshe Cipro but would like her opinion. Lisinopril not advised with likely renal artery stenosis contributing to CKD" H/o BP readings:  03/24/15 138/72 down from 152/78 12/22/14 Medication Management:  Bisoprolol 10mg , amlodipine 10mg .  Consent: Patient agreed to [facility] services H/o [facility] package and consent mailed out on 03/25/15.   Plan:   RN CM will send Emmi Ed materials on HTN management -About Hight Blood Pressure (Hypertension) -High Blood Pressure (Hypertension): What You Can Do -Low-Salt Diet RN CM will continue to follow-up on resolution to PNA.  RN CM will follow-up on return of written consent.  RN CM will scheduled next outreach for Monthly Assessment within one month.  RN CM will notify Dr. [name] of [facility] services via letter and send initial assessment.  RN CM advised to please notify MD of any changes in condition prior to scheduled appt's.  RN CM  provided contact name and # [phone] or main office # [phone] and 24-hour nurse line # [phone].  RN CM confirmed patient is aware of 911 services for urgent emergency needs.  Mariann Laster, RN, BSN, [facility], CCM  [employer] Management Coordinator [phone] Direct [phone] Cell [phone] Office [phone] Fax

## 2015-04-01 ENCOUNTER — Other Ambulatory Visit: Payer: Self-pay | Admitting: Family Medicine

## 2015-04-01 NOTE — Patient Outreach (Signed)
[place] [facility]) Care Management  04/01/2015  Kevin [name] 1941/05/07 [id number]   RED on EMMI Pneumonia Dashboard, notification sent to Mariann Laster, RN.  Thanks, [name]. [place], [place] Assistant Phone: [phone] Fax: [phone]

## 2015-04-02 ENCOUNTER — Other Ambulatory Visit: Payer: Self-pay

## 2015-04-02 NOTE — Patient Outreach (Signed)
[place] [facility]) Care Management  04/02/2015  Kevin [name] 07/28/1940 [id number]   Emmi PNA Program Red on Emmi Dashboard  Had a flu shot this year? NO   Patient reached via phone call.  Patient confirms he has not received Flu Vaccine and does not plan to get the vaccine due to h/o past reaction to flu vaccine.  States the vaccine always makes him sick.  States he did get the Pneumonia Vaccine 2016 and within a month; he had his first pneumonia.  Intervention/Plan:   RN CM provided education on Flu season, vaccines and self-management interventions to prevent flu.  RN CM sent Emmi Ed: -The Flu  Patient C/O ankle swelling on 03/26/15.  States BP running 127-134-142/55-72.  Weight 182.  States compliance with daily weights and BP monitoring.  States compliance with taking medication.    Intervention/Plan RN CM provided patient with self-management interventions -take medication as ordered; not missing any dosages -keep legs elevated above heart level when resting / sitting for periods of time.  -maintain activity level to improve circulation -low salt diet; increase fresh vegetables and fruit and void eating out.  -daily weights and BP monitoring and logging -report swelling to MD on next visit as MD will need to assess for effective clinical response to medication and dosage.  -Continue with HTN management goals of decreasing BP readings to target range.  RN CM sent Emmi Ed: -Low Weir, RN, BSN, [facility] [place] Management Care Management Coordinator [phone] Direct [phone] Cell [phone] Office [phone] Fax

## 2015-04-06 DIAGNOSIS — H2512 Age-related nuclear cataract, left eye: Secondary | ICD-10-CM | POA: Diagnosis not present

## 2015-04-06 DIAGNOSIS — H25812 Combined forms of age-related cataract, left eye: Secondary | ICD-10-CM | POA: Diagnosis not present

## 2015-04-07 DIAGNOSIS — H2511 Age-related nuclear cataract, right eye: Secondary | ICD-10-CM | POA: Diagnosis not present

## 2015-04-07 DIAGNOSIS — H2512 Age-related nuclear cataract, left eye: Secondary | ICD-10-CM | POA: Diagnosis not present

## 2015-04-10 ENCOUNTER — Other Ambulatory Visit: Payer: Self-pay | Admitting: *Deleted

## 2015-04-10 ENCOUNTER — Telehealth: Payer: Self-pay | Admitting: Family Medicine

## 2015-04-10 ENCOUNTER — Ambulatory Visit (INDEPENDENT_AMBULATORY_CARE_PROVIDER_SITE_OTHER)
Admission: RE | Admit: 2015-04-10 | Discharge: 2015-04-10 | Disposition: A | Discharge status: Home / Self Care | Payer: Medicare Other | Source: Ambulatory Visit | Attending: Family Medicine | Admitting: Family Medicine

## 2015-04-10 DIAGNOSIS — J189 Pneumonia, unspecified organism: Secondary | ICD-10-CM | POA: Diagnosis not present

## 2015-04-10 MED ORDER — BISOPROLOL FUMARATE 10 MG PO TABS: 10.0000 mg | ORAL_TABLET | Freq: Every day | ORAL | Status: DC

## 2015-04-10 NOTE — Telephone Encounter (Signed)
[facility] and they req that it be sent to [facility] [address]   bisoprolol (ZEBETA) 10 MG tablet

## 2015-04-10 NOTE — Telephone Encounter (Signed)
Rx sent to [facility] on [address]

## 2015-04-15 ENCOUNTER — Telehealth: Payer: Self-pay | Admitting: Family Medicine

## 2015-04-15 MED ORDER — BISOPROLOL FUMARATE 10 MG PO TABS: 10.0000 mg | ORAL_TABLET | Freq: Every day | ORAL | Status: DC

## 2015-04-15 NOTE — Telephone Encounter (Signed)
Pt request refill of the following:  bisoprolol (ZEBETA) 10 MG tablet   Pt said [facility] and it cost him 145 and he will be sending the medicine back . Pt request a rx been sent to [facility]      Phamacy: [facility]

## 2015-04-15 NOTE — Telephone Encounter (Signed)
Medication sent in to [facility].

## 2015-04-20 ENCOUNTER — Encounter: Payer: Self-pay | Admitting: Family Medicine

## 2015-04-20 ENCOUNTER — Ambulatory Visit (INDEPENDENT_AMBULATORY_CARE_PROVIDER_SITE_OTHER): Payer: Medicare Other | Admitting: Family Medicine

## 2015-04-20 VITALS — BP 148/64 | HR 69 | Temp 97.9°F | Wt 193.0 lb

## 2015-04-20 DIAGNOSIS — R600 Localized edema: Secondary | ICD-10-CM | POA: Diagnosis not present

## 2015-04-20 DIAGNOSIS — I10 Essential (primary) hypertension: Secondary | ICD-10-CM

## 2015-04-20 DIAGNOSIS — M1710 Unilateral primary osteoarthritis, unspecified knee: Secondary | ICD-10-CM

## 2015-04-20 DIAGNOSIS — M1712 Unilateral primary osteoarthritis, left knee: Secondary | ICD-10-CM

## 2015-04-20 DIAGNOSIS — M179 Osteoarthritis of knee, unspecified: Secondary | ICD-10-CM | POA: Diagnosis not present

## 2015-04-20 LAB — COMPREHENSIVE METABOLIC PANEL
ALBUMIN: 4 g/dL (ref 3.5–5.2)
ALT: 20 U/L (ref 0–53)
AST: 25 U/L (ref 0–37)
Alkaline Phosphatase: 30 U/L — ABNORMAL LOW (ref 39–117)
BUN: 18 mg/dL (ref 6–23)
CHLORIDE: 104 meq/L (ref 96–112)
CO2: 27 meq/L (ref 19–32)
CREATININE: 1.75 mg/dL — AB (ref 0.40–1.50)
Calcium: 9.2 mg/dL (ref 8.4–10.5)
GFR: 40.44 mL/min — ABNORMAL LOW (ref >60.00)
GLUCOSE: 141 mg/dL — AB (ref 70–99)
Potassium: 4.9 mEq/L (ref 3.5–5.1)
SODIUM: 138 meq/L (ref 135–145)
Total Bilirubin: 0.4 mg/dL (ref 0.2–1.2)
Total Protein: 6.8 g/dL (ref 6.0–8.3)

## 2015-04-20 LAB — TSH: TSH: 1.09 u[IU]/mL (ref 0.35–4.50)

## 2015-04-20 MED ORDER — METHYLPREDNISOLONE ACETATE 80 MG/ML IJ SUSP
80.0000 mg | Freq: Once | INTRAMUSCULAR | Status: AC
  Administered 2015-04-20: 80 mg via INTRAMUSCULAR

## 2015-04-20 NOTE — Assessment & Plan Note (Signed)
S: controlled poorly on amlodipine 10mg  alone. Having cost issues on bisoprolol through mail order and cant pick up locally until resolved BP Readings from Last 3 Encounters:  04/20/15 148/64  03/26/15 138/72  03/24/15 138/72  A/P:Continue current meds:  I encouraged with recent stroke that patient should pay out of pocket for medicine if possible or open bag- really need to control blood pressure to prevent recurrent stroke

## 2015-04-20 NOTE — Progress Notes (Signed)
Garret Reddish, MD  Subjective:  Kevin [name] is a 74 y.o. year old very pleasant male patient who presents for/with See problem oriented charting ROS- No chest pain or shortness of breath. No headache or blurry vision. Some pretibial edema.   Past Medical History-  Patient Active Problem List   Diagnosis Date Noted  . CKD (chronic kidney disease), stage III 01/27/2014    Priority: High  . PVD (peripheral vascular disease) ([place]) 01/23/2014    Priority: High  . AAA (abdominal aortic aneurysm, ruptured) ([place]) 01/17/2013    Priority: High  . History of CVA (cerebrovascular accident) 11/17/2011    Priority: High  . Chronic pain syndrome 12/23/2009    Priority: High  . Personal history of colonic polyps 05/15/2013    Priority: Medium  . Carotid Artery Stenosis L s/p endarterectomy 01/12/2012    Priority: Medium  . Essential tremor 10/05/2009    Priority: Medium  . UNSPECIFIED ANEMIA 12/05/2008    Priority: Medium  . BPH (benign prostatic hyperplasia) 06/04/2007    Priority: Medium  . Fasting hyperglycemia 02/05/2007    Priority: Medium  . Hyperlipidemia 01/03/2007    Priority: Medium  . Essential hypertension 01/03/2007    Priority: Medium  . Family history of malignant neoplasm of gastrointestinal tract 05/15/2013    Priority: Low  . Inguinal hernia 05/01/2012    Priority: Low  . BURSITIS, HIP 12/21/2009    Priority: Low  . ACTINIC KERATOSIS, EAR, LEFT 03/09/2009    Priority: Low  . Osteoarthritis of left knee 11/02/2007    Priority: Low  . 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 CAP (community acquired pneumonia) 03/17/2015    Medications- reviewed and updated Current Outpatient Prescriptions  Medication Sig Dispense Refill  . amLODipine (NORVASC) 10 MG tablet Take 1 tablet by mouth  daily 90 tablet 3  . atorvastatin (LIPITOR) 20 MG tablet Take 1 tablet by mouth  daily 90 tablet 3  . bisoprolol (ZEBETA) 10 MG tablet Take 1 tablet (10 mg  total) by mouth daily. 90 tablet 3  . Cholecalciferol (VITAMIN D-3) 1000 UNITS CAPS Take 1 capsule by mouth daily.    . clopidogrel (PLAVIX) 75 MG tablet Take 1 tablet (75 mg total) by mouth daily. 90 tablet 1  . fenofibrate (TRICOR) 145 MG tablet Take 1 tablet by mouth  daily 90 tablet 2  . gabapentin (NEURONTIN) 300 MG capsule Take 300 mg by mouth 2 (two) times daily.    Marland Kitchen loratadine (CLARITIN) 10 MG tablet Take 10 mg by mouth daily as needed for allergies (congestion).    . Melatonin 3 MG TABS Take 3 mg by mouth at bedtime.     . methocarbamol (ROBAXIN) 500 MG tablet Take 500 mg by mouth 2 (two) times daily.    . tamsulosin (FLOMAX) 0.4 MG CAPS capsule Take 1 capsule (0.4 mg total) by mouth daily. 90 capsule 1  . tiZANidine (ZANAFLEX) 4 MG tablet Take 1 tablet by mouth  every 6 hours as needed for muscle spasm(s) (Patient taking differently: TAKE 2MG  IN THE MORNING AND 8MG  AT BEDTIME) 90 tablet 2  . vitamin B-12 (CYANOCOBALAMIN) 1000 MCG tablet Take 1,000 mcg by mouth daily.    . diazepam (VALIUM) 5 MG tablet Take 0.5-1 tablets (2.5-5 mg total) by mouth every 6 (six) hours as needed for muscle spasms or sedation. (Patient not taking: Reported on 04/20/2015) 40 tablet 2  . oxyCODONE-acetaminophen (PERCOCET) 7.5-325 MG per tablet Take 1 tablet  by mouth 3 (three) times daily.      No current facility-administered medications for this visit.    Objective: BP 148/64 mmHg  Pulse 69  Temp(Src) 97.9 F (36.6 C)  Wt 193 lb (87.544 kg) Gen: NAD, resting comfortably CV: RRR no murmurs rubs or gallops Lungs: CTAB no crackles, wheeze, rhonchi Abdomen: soft/nontender/nondistended/normal bowel sounds. No rebound or guarding. Obese  Ext: trace edema Skin: warm, dry Neuro: grossly normal, moves all extremities   Assessment/Plan:  Essential hypertension S: controlled poorly on amlodipine 10mg  alone. Having cost issues on bisoprolol through mail order and cant pick up locally until resolved BP  Readings from Last 3 Encounters:  04/20/15 148/64  03/26/15 138/72  03/24/15 138/72  A/P:Continue current meds:  I encouraged with recent stroke that patient should pay out of pocket for medicine if possible or open bag- really need to control blood pressure to prevent recurrent stroke   Osteoarthritis of left knee S: knee has done better in recent years but over last few months has had more issues. When walking pain up to 5-6/10 though not everytime. Usually at least 4-5 days a week. Has responded well to steroid shots in past O:lateral joint line tenderness only. Ligaments stable as well as meniscus Verbal Consent obtained and verified. Sterile betadine prep. Furthur cleansed with alcohol. Topical analgesic spray: Ethyl chloride. Joint: Left knee Approached in typical fashion with: Medial approach with leg at 90 degrees Completed without difficulty Meds: 4 cc lidocaine and 1 cc depomedrol 80mg /cc Needle: 25 gauge 1.5 inch needle Aftercare instructions and Red flags advised.  A/P: Hopeful at least 3 months of improvement- has responded well in past. Some immediate improvement in office.   Edema S:2 days swelling few months ago pretibial. 3-4 days recently less severe than last time A/P: echo 2016 without obvious HF. Will update labs to evaluate cause but honestly edema is minimal and patient has known CKD and is sedentary- doubt underlying cause. DId have some anemia from labs in October in hospital but suspect that is improving  Return precautions advised.   Orders Placed This Encounter  Procedures  . CBC    [place]  . Comprehensive metabolic panel    [place]  . TSH    [place]    Meds ordered this encounter  Medications  . methylPREDNISolone acetate (DEPO-MEDROL) injection 80 mg    Sig:

## 2015-04-20 NOTE — Patient Instructions (Addendum)
Left knee injection today Should hopefully last at least 3 months Hoping for at least 50% improvement in pain  Check some labs given you are having swelling but swelling is minimal fortunately. Would try elevating legs or using over the counter compression stockings  Get back on bisoprolol as soon as you can

## 2015-04-20 NOTE — Assessment & Plan Note (Signed)
S: knee has done better in recent years but over last few months has had more issues. When walking pain up to 5-6/10 though not everytime. Usually at least 4-5 days a week. Has responded well to steroid shots in past O:lateral joint line tenderness only. Ligaments stable as well as meniscus Verbal Consent obtained and verified. Sterile betadine prep. Furthur cleansed with alcohol. Topical analgesic spray: Ethyl chloride. Joint: Left knee Approached in typical fashion with: Medial approach with leg at 90 degrees Completed without difficulty Meds: 4 cc lidocaine and 1 cc depomedrol 80mg /cc Needle: 25 gauge 1.5 inch needle Aftercare instructions and Red flags advised.  A/P: Hopeful at least 3 months of improvement- has responded well in past. Some immediate improvement in office.

## 2015-04-21 LAB — CBC
HCT: 35.6 % — ABNORMAL LOW (ref 39.0–52.0)
Hemoglobin: 11.7 g/dL — ABNORMAL LOW (ref 13.0–17.0)
MCHC: 32.8 g/dL (ref 30.0–36.0)
MCV: 92.1 fl (ref 78.0–100.0)
Platelets: 190 10*3/uL (ref 150.0–400.0)
RBC: 3.87 Mil/uL — AB (ref 4.22–5.81)
RDW: 14.2 % (ref 11.5–15.5)
WBC: 8 10*3/uL (ref 4.0–10.5)

## 2015-04-22 ENCOUNTER — Ambulatory Visit: Payer: Medicare Other

## 2015-04-24 ENCOUNTER — Other Ambulatory Visit: Payer: Self-pay

## 2015-04-24 NOTE — Patient Outreach (Signed)
[place] [facility]) Care Management  04/24/2015  TUSTIN BOARD 12-Dec-1940 [id number]  Referral Date: 03/23/2015 Referral Source:  Emmi Pneumonia Program Issue:  PNA H/o Emmi PNA follow-up and Initial Assessment completed 03/26/15.   Social:  Patient lives in his home with his wife.  Mobility: Patient states he is active. Patient is HOH and has a hearing aide.  Falls: None  Pain: chronic back pain issues and is on a pain management regime. Depression: none  Caregiver: Wife Advance Directives: Yes DME: Hearing aide, BP cuff, scales  PNA Patient states increased secretions in the morning after sleeping but remain clear and unlike what he had during his PNA illness.   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    Patient C/O increased thirst and drinking more.  States completed visit last week with Dr. [name] and labs drawn.  Patient states that MD office will contact him if any concerns.  Patient states he drinks regular cola's.   Weight 182 - 185.    Consent: Patient agreed to continue with [facility] services H/o [facility] package and consent mailed out on 03/25/15.  Patient has package but has not returned consent.   Plan:  PNA RN CM instructed to drink water rather than colas to help think secretions.  RN CM encouraged to take deep breaths during the day to help clear secretions from lungs.  RN CM advised to report any worsening symptoms to MD.   HTN: Emmi Ed materials on HTN management reviewed 04/24/2015 -About Hight Blood Pressure (Hypertension) -High Blood Pressure (Hypertension): What You Can Do -Low-Salt Diet  High Glucose reading: RN CM instructed to avoid and discontinue use of regular cola products due to sugar consumption.  RN CM instructed to be mindful of diet and avoid rich foods with sugar and  salt over the holidays.  RN CM discussed importance of diet management to help prevent risk of diabetes.  RN CM advised patient to report symptom of increased thirst to Dr. [name].  New Emmi Educational materials sent to patient [id number] -Metabolic Syndrome: Am I At Risk -Metabolic Syndrome: What You Can Do -Counting Carbohydrates  Consent: RN CM will continue to follow-up on return of written consent.   Follow-up: RN CM will scheduled next outreach for Monthly Assessment within one month to review educational materials and follow-up on glucose reading.  RN CM will plan to close case if no other issues identified at that time.   RN CM advised to please notify MD of any changes in condition prior to scheduled appt's.  RN CM provided contact name and # [phone] or main office # [phone] and 24-hour nurse line # [phone].  RN CM confirmed patient is aware of 911 services for urgent emergency needs.  Mariann Laster, RN, BSN, [facility], CCM  [employer] Management Coordinator [phone] Direct [phone] Cell [phone] Office [phone] Fax

## 2015-04-25 ENCOUNTER — Other Ambulatory Visit: Payer: Self-pay | Admitting: Family Medicine

## 2015-05-11 DIAGNOSIS — Z79899 Other long term (current) drug therapy: Secondary | ICD-10-CM | POA: Diagnosis not present

## 2015-05-11 DIAGNOSIS — M4696 Unspecified inflammatory spondylopathy, lumbar region: Secondary | ICD-10-CM | POA: Diagnosis not present

## 2015-05-11 DIAGNOSIS — M47817 Spondylosis without myelopathy or radiculopathy, lumbosacral region: Secondary | ICD-10-CM | POA: Diagnosis not present

## 2015-05-11 DIAGNOSIS — M5137 Other intervertebral disc degeneration, lumbosacral region: Secondary | ICD-10-CM | POA: Diagnosis not present

## 2015-05-11 DIAGNOSIS — G894 Chronic pain syndrome: Secondary | ICD-10-CM | POA: Diagnosis not present

## 2015-05-12 DIAGNOSIS — H2511 Age-related nuclear cataract, right eye: Secondary | ICD-10-CM | POA: Diagnosis not present

## 2015-05-20 ENCOUNTER — Telehealth: Payer: Self-pay | Admitting: Family Medicine

## 2015-05-20 NOTE — Telephone Encounter (Signed)
Pt Wife [name] call to say that she think something is wrong with Abdulsamad and would like to speak with Dr [name] about it.   [phone]

## 2015-05-20 NOTE — Telephone Encounter (Signed)
Tried calling back.  No machine at number.   Will try again at a later time.

## 2015-05-20 NOTE — Telephone Encounter (Signed)
Thanks for update. See tomorrow.

## 2015-05-20 NOTE — Telephone Encounter (Signed)
Spoke to the pt.  He stated that he has been feeling very tired and he is dozing off frequently during the day.  Pt was oriented to time and day.  He has made an appt to come and see Dr. [name] on 05/21/15 @ 11:15 AM.  Pt notified to arrive at 11.  Will forward to Dr. [name] as an Kevin [name].

## 2015-05-21 ENCOUNTER — Ambulatory Visit (INDEPENDENT_AMBULATORY_CARE_PROVIDER_SITE_OTHER): Payer: Medicare Other | Admitting: Family Medicine

## 2015-05-21 ENCOUNTER — Other Ambulatory Visit: Payer: Self-pay

## 2015-05-21 ENCOUNTER — Encounter: Payer: Self-pay | Admitting: Family Medicine

## 2015-05-21 VITALS — BP 140/64 | HR 63 | Temp 98.5°F | Wt 194.0 lb

## 2015-05-21 DIAGNOSIS — R0602 Shortness of breath: Secondary | ICD-10-CM

## 2015-05-21 DIAGNOSIS — R0683 Snoring: Secondary | ICD-10-CM | POA: Diagnosis not present

## 2015-05-21 DIAGNOSIS — G250 Essential tremor: Secondary | ICD-10-CM

## 2015-05-21 DIAGNOSIS — G4719 Other hypersomnia: Secondary | ICD-10-CM | POA: Diagnosis not present

## 2015-05-21 DIAGNOSIS — R5383 Other fatigue: Secondary | ICD-10-CM

## 2015-05-21 LAB — TSH: TSH: 2.57 u[IU]/mL (ref 0.35–4.50)

## 2015-05-21 LAB — COMPREHENSIVE METABOLIC PANEL
ALT: 17 U/L (ref 0–53)
AST: 20 U/L (ref 0–37)
Albumin: 4.3 g/dL (ref 3.5–5.2)
Alkaline Phosphatase: 32 U/L — ABNORMAL LOW (ref 39–117)
BILIRUBIN TOTAL: 0.5 mg/dL (ref 0.2–1.2)
BUN: 23 mg/dL (ref 6–23)
CO2: 29 meq/L (ref 19–32)
Calcium: 9.8 mg/dL (ref 8.4–10.5)
Chloride: 104 mEq/L (ref 96–112)
Creatinine, Ser: 1.87 mg/dL — ABNORMAL HIGH (ref 0.40–1.50)
GFR: 37.45 mL/min — AB (ref >60.00)
GLUCOSE: 119 mg/dL — AB (ref 70–99)
Potassium: 5.1 mEq/L (ref 3.5–5.1)
SODIUM: 141 meq/L (ref 135–145)
Total Protein: 7.2 g/dL (ref 6.0–8.3)

## 2015-05-21 LAB — HEMOGLOBIN A1C: Hgb A1c MFr Bld: 6 % (ref 4.6–6.5)

## 2015-05-21 LAB — CBC
HCT: 38.3 % — ABNORMAL LOW (ref 39.0–52.0)
HEMOGLOBIN: 12.6 g/dL — AB (ref 13.0–17.0)
MCHC: 32.9 g/dL (ref 30.0–36.0)
MCV: 91.1 fl (ref 78.0–100.0)
Platelets: 220 10*3/uL (ref 150.0–400.0)
RBC: 4.21 Mil/uL — ABNORMAL LOW (ref 4.22–5.81)
RDW: 13.9 % (ref 11.5–15.5)
WBC: 7.7 10*3/uL (ref 4.0–10.5)

## 2015-05-21 NOTE — Assessment & Plan Note (Signed)
S: known tremor but usually R >L. Recently left arm seems to be worsening. At least for last few months A/P: discussed neurology referral vs. MRI but with other workup patient declines at this time and will monitor while continuing bisoprolol

## 2015-05-21 NOTE — Progress Notes (Signed)
Garret Reddish, MD  Subjective:  Kevin [name] is a 74 y.o. year old very pleasant male patient who presents for/with See problem oriented charting ROS- see HPI- daytime sleepiness, nodding off, shortness of breath, wheeze in AM. Tremor noted. No fever/chills/nausea/vomiting  Past Medical History-  Patient Active Problem List   Diagnosis Date Noted  . CKD (chronic kidney disease), stage III 01/27/2014    Priority: High  . PVD (peripheral vascular disease) ([place]) 01/23/2014    Priority: High  . AAA (abdominal aortic aneurysm, ruptured) ([place]) 01/17/2013    Priority: High  . History of CVA (cerebrovascular accident) 11/17/2011    Priority: High  . Chronic pain syndrome 12/23/2009    Priority: High  . Personal history of colonic polyps 05/15/2013    Priority: Medium  . Carotid Artery Stenosis L s/p endarterectomy 01/12/2012    Priority: Medium  . Essential tremor 10/05/2009    Priority: Medium  . UNSPECIFIED ANEMIA 12/05/2008    Priority: Medium  . BPH (benign prostatic hyperplasia) 06/04/2007    Priority: Medium  . Fasting hyperglycemia 02/05/2007    Priority: Medium  . Hyperlipidemia 01/03/2007    Priority: Medium  . Essential hypertension 01/03/2007    Priority: Medium  . Family history of malignant neoplasm of gastrointestinal tract 05/15/2013    Priority: Low  . Inguinal hernia 05/01/2012    Priority: Low  . BURSITIS, HIP 12/21/2009    Priority: Low  . ACTINIC KERATOSIS, EAR, LEFT 03/09/2009    Priority: Low  . Osteoarthritis of left knee 11/02/2007    Priority: Low  . 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 CAP (community acquired pneumonia) 03/17/2015   Medications- reviewed and updated Current Outpatient Prescriptions  Medication Sig Dispense Refill  . amLODipine (NORVASC) 10 MG tablet Take 1 tablet by mouth  daily 90 tablet 3  . atorvastatin (LIPITOR) 20 MG tablet Take 1 tablet by mouth  daily 90 tablet 3  . bisoprolol (ZEBETA) 10 MG  tablet Take 1 tablet (10 mg total) by mouth daily. 90 tablet 3  . Cholecalciferol (VITAMIN D-3) 1000 UNITS CAPS Take 1 capsule by mouth daily.    . clopidogrel (PLAVIX) 75 MG tablet Take 1 tablet (75 mg total) by mouth daily. 90 tablet 1  . diazepam (VALIUM) 5 MG tablet Take 0.5-1 tablets (2.5-5 mg total) by mouth every 6 (six) hours as needed for muscle spasms or sedation. (Patient not taking: Reported on 04/20/2015) 40 tablet 2  . fenofibrate (TRICOR) 145 MG tablet Take 1 tablet by mouth  daily 90 tablet 2  . gabapentin (NEURONTIN) 300 MG capsule Take 300 mg by mouth 2 (two) times daily.    . Melatonin 3 MG TABS Take 3 mg by mouth at bedtime.     . methocarbamol (ROBAXIN) 500 MG tablet Take 500 mg by mouth 2 (two) times daily.    Marland Kitchen oxyCODONE-acetaminophen (PERCOCET) 7.5-325 MG per tablet Take 1 tablet by mouth 3 (three) times daily.     . tamsulosin (FLOMAX) 0.4 MG CAPS capsule Take 1 capsule (0.4 mg total) by mouth daily. 90 capsule 1  . tiZANidine (ZANAFLEX) 4 MG tablet Take 1 tablet by mouth  every 6 hours as needed for muscle spasms 90 tablet 1  . vitamin B-12 (CYANOCOBALAMIN) 1000 MCG tablet Take 1,000 mcg by mouth daily.     No current facility-administered medications for this visit.    Objective: BP 140/64 mmHg  Pulse 63  Temp(Src) 98.5 F (36.9  C)  Wt 194 lb (87.998 kg)  SpO2 95% Gen: NAD, resting comfortably No obvious JVD CV: RRR no murmurs rubs or gallops Lungs: CTAB no crackles, wheeze, rhonchi Abdomen: soft/nontender/nondistended/normal bowel sounds.  Ext: trace edema Skin: warm, dry, no rash Neuro: grossly normal, moves all extremities  EKG: sinus bradycardia at rate of 52, normal axis, normal intervals, no hypertrophy. Poor baseline on v1, v2 movement related?read as repolarization variant. No hypertrophy. No significant st or t wave changes otherwise  Assessment/Plan:  Fatigue   S:since getting out of hospital. Will nod off with reading a book in daytime when  didn't use to be a problem. Thinks likely going on longer than that but more noticeable recently Can go to sleep at times talking on phone. Shortness of breath in the morning when wakes up and notes himself wheezing and will wheeze at other times as well. No dyspnea on exertion though. Some productive cough but mainly clear with perhaps slight yellow. No thick brown sputum like he had previously. Sleeping in a recliner- slept there for years with no recent change. Snores even with sleeping in recliner. Known history at risk for diabetes. Is on several sedating medications including Oxycodone 3x a day, tizanidine during day.  A/P: 74 year old presenting with fatigue/snoring/daytime sleepiness. We will send for sleep study which I think is most likely cause. Also with fatigue get CBC, CMET, TSH, a1c. Recent CXR showed pneumonia clearance and will not repeat.  Also with complaint of SOB/wheeze but only in AM mainly ? Orthopnea. We will get an echocardiogram to see if any changes since 2013 study. EKG similar to prior except for baseline in 2 leads which could be movement related.   Essential tremor S: known tremor but usually R >L. Recently left arm seems to be worsening. At least for last few months A/P: discussed neurology referral vs. MRI but with other workup patient declines at this time and will monitor while continuing bisoprolol   return after sleep study Return precautions advised.   Orders Placed This Encounter  Procedures  . CBC    [place]  . Comprehensive metabolic panel    [place]  . Hemoglobin A1c    [place]  . TSH    [place]  . Ambulatory referral to Sleep Studies    Referral Priority:  Routine    Referral Type:  Consultation    Referral Reason:  Specialty Services Required    Number of Visits Requested:  1  . EKG 12-Lead  . ECHOCARDIOGRAM COMPLETE    Standing Status: Future     Number of Occurrences:      Standing Expiration Date: 08/20/2016    Order Specific Question:   Where should this test be performed    Answer:  [facility])    Order Specific Question:  Complete or Limited study?    Answer:  Complete    Order Specific Question:  With Image Enhancing Agent or without Image Enhancing Agent?    Answer:  With Image Enhancing Agent    Order Specific Question:  Reason for exam-Echo    Answer:  Dyspnea  786.09 / R06.00

## 2015-05-21 NOTE — Patient Instructions (Addendum)
EKG today largely unchanged from previous  Considered chest x-ray but recently did that and showed cleared pneumonia  Make sure no anemia or thyroid or kidney or liver issues as cause of fatigue. Also make sure no diabetes.   Send for sleep study- they will call you about this. Given your snoring, daytime sleepiness I think this is high on the list of potential causes  Left arm tremor- could be a part of essential tremor you have had for sometime but unclear. We discussed considering MRI or neurology referral but you declined for now  Health Maintenance Due  Topic Date Due  . INFLUENZA VACCINE - do you want today? declined 12/29/2014   Let's follow up after sleep study or sooner if symptoms worsen

## 2015-05-21 NOTE — Patient Outreach (Signed)
Kevin [name] Kevin [name]) Care Management  05/21/2015  Kevin [name] 1941/03/16 [id number]   Outreach call #1 to patient for telephonic monthly assessment.   Patient not reached.  RN CM left HIPAA compliant voice message and left name and number for call back. RN CM will schedule for next outreach call.   Kevin Laster, RN, BSN, [facility], CCM  [employer] Management Coordinator [phone] Direct [phone] Cell [phone] Office [phone] Fax

## 2015-05-23 ENCOUNTER — Other Ambulatory Visit: Payer: Self-pay | Admitting: Family Medicine

## 2015-05-27 DIAGNOSIS — R5383 Other fatigue: Secondary | ICD-10-CM | POA: Diagnosis not present

## 2015-05-27 DIAGNOSIS — N183 Chronic kidney disease, stage 3 (moderate): Secondary | ICD-10-CM | POA: Diagnosis not present

## 2015-05-27 DIAGNOSIS — I10 Essential (primary) hypertension: Secondary | ICD-10-CM | POA: Diagnosis not present

## 2015-05-27 DIAGNOSIS — E785 Hyperlipidemia, unspecified: Secondary | ICD-10-CM | POA: Diagnosis not present

## 2015-05-28 ENCOUNTER — Ambulatory Visit: Payer: Self-pay

## 2015-06-03 ENCOUNTER — Other Ambulatory Visit: Payer: Self-pay

## 2015-06-03 NOTE — Patient Outreach (Signed)
[place] [facility]) Care Management  06/03/2015  Kevin [name] [birth year] [id number]   Telephonic Monthly Assessment   Outreach call #2 to patient for telephonic monthly assessment.  Patient not reached.  RN CM left HIPAA compliant voice message and left name and number for call back. Per EPIC MR:  Patient followed up with Primary MD:  Dr. [name] 05/21/15 with C/O fatigue, shortness of breath and nodding off during the day.  Sleep Study ordered by Dr. [name].   Plan:   RN CM will schedule for next outreach call within one week.   Mariann Laster, RN, BSN, [facility], CCM  [employer] Management Coordinator [phone] Direct [phone] Cell [phone] Office [phone] Fax

## 2015-06-04 ENCOUNTER — Other Ambulatory Visit: Payer: Self-pay

## 2015-06-04 NOTE — Patient Outreach (Signed)
[place] [facility]) Care Management  06/04/2015  Kevin [name] 1940/11/13 [id number]  Telephonic Monthly Assessment  Inbound call from patient.  Patient returning RN CM previously attempted calls.   Referral Date: 03/23/2015 Referral Source: [facility] Pneumonia Program Issue: PNA  Social:  Patient lives in his home with his wife.  Mobility: Patient states he is active. Patient is HOH and has a hearing aide.  Falls: None  Pain: chronic back pain issues and is on a pain management regime managed by pain MD. Depression: states his family tell him quite often he is depressed but he does not really recognize it as depression.   Caregiver: Wife, [name] Advance Directives: Yes DME: Hearing aide, BP cuff, scales, back brace  Sleep Study:   Patient followed up with Primary MD: Dr. [name] 05/21/15 with C/O fatigue, shortness of breath and nodding off during the day. Sleep Study ordered by Dr. [name].  Patient states he has decided not to have the sleep study because "I know I will not wear the sleep mask."  States he does not believe he has a sleep problem.  States his nodding off has improved since 05/21/15.  States he has always slept well with no problems sleeping. States he has snored all his life.  States he has slept in a recliner for about 10 years and recently tried to sleep in his bed but he woke up about 20 times that night due to pain.  (Please see plan below: patient will have sleep study).   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  States he does not plan to ever have surgery and manages with pain medication regime. Percocet 7.5-325mg  1 tablet morning and bedtime (may take up to 3 tablets a day but avoids doing this unless needed).  Tizanidine (Zanaflex) 4mg  tablet at bedtime Melatonin 3mg  tablet at bedtime.  Back brace as needed Patient is followed by a Pain Management MD.   PNA Admission:  (1) 03/17/2015  -03/18/2015 Patient states increased secretions in the morning after sleeping in his recliner but remain clear and unlike what he had during his PNA illness.   HTN H/o MI 10/29/2011 and stroke 10/29/2011 H/o MD, Dr [name] BP Target and plan:130/80 BP running 127-140's BP 140/64 on MD appt 05/21/15.  H/o [facility]i Ed materials on HTN management reviewed 04/24/2015 -About Hight Blood Pressure (Hypertension) -High Blood Pressure (Hypertension): What You Can Do -Low-Salt Diet  High Glucose Reading:  H/o Per Epic 04/20/15 Glucose elevated at 141 and down to 119 on 05/21/15.   Patient C/O increased thirst and drinking more. States completed visit last week with Dr. [name] and labs drawn.  Patient states that MD office will contact him if any concerns. Patient states he drinks regular cola's.  Weight up 4 lbs this month:  189. H/o Emmi Educational materials sent to patient 04/24/15 and reviewed [id number] -Metabolic Syndrome: Am I At Risk -Metabolic Syndrome: What You Can Do -Counting Carbohydrates  PLAN:  Referral: 03/23/2015 Case Management Start Date:  03/25/2015 Program:  HTN 03/25/2015.  [facility] PNA Program:  04/01/2015 - 04/02/2015 [facility]i PNA follow-up and Initial Assessment completed 03/26/15.   Sleep Study RN CM provided education on importance of following MDs recommendations for sleep study.  Advised that patient can make more educated decision based off of results.  Advised if testing comes back normal that patient may not need sleep mask.  Provided education on health risk associated with sleep disorders.  RN CM provided  patient education on importance of getting back into his bed for rest rather than a recliner.  Discussed increased risk of pooling secretions, circulation, pressure points, proper rest and pain management.  Patient verbalized improved understanding following discussion and stated he would agree to schedule his study.  Patient states plan to request his study be  completed in his home.  Emmi Educational materials mailed 06/04/2015 -What Happens During A Sleep Study (Ploysomnogram) -About Sleep Apnea (OSA) -Why Have a Sleep Study (Polysomnogram) -Why Treat Sleep Apnea? -After The Sleep Study (Ploysomnogram) RN CM will continue to follow patient's compliance with sleep study order and assist with any care coordination services as needed.   HTN RN CM encouraged patient to focus on getting weight back down and to monitor daily for weight gain.   Advised increased weight will also increase BP.   High Glucose reading: RN CM instructed to avoid and discontinue use of regular cola products due to sugar consumption.  RN CM instructed to be mindful of diet and avoid rich foods with sugar and salt over the holidays.  RN CM discussed importance of diet management to help prevent risk of diabetes. RN CM instructed to drink water rather than regular colas to help thin secretions.    Depression RN CM provided education on symptoms of depression associated to chronic pain and health conditions.  Emmi Educational materials mailed 06/04/2015 -Coping With A Health Condition:  Signs of Depression   RN CM will scheduled next outreach for Monthly Assessment within one month   RN CM advised to please notify MD of any changes in condition prior to scheduled appt's.  RN CM provided contact name and # [phone] or main office # [phone] and 24-hour nurse line # [phone].  RN CM confirmed patient is aware of 911 services for urgent emergency needs.  Mariann Laster, RN, BSN, [facility], CCM  [employer] Management Coordinator [phone] Direct [phone] Cell [phone] Office [phone] Fax

## 2015-06-05 ENCOUNTER — Ambulatory Visit: Payer: Self-pay

## 2015-06-19 DIAGNOSIS — H25811 Combined forms of age-related cataract, right eye: Secondary | ICD-10-CM | POA: Diagnosis not present

## 2015-06-19 DIAGNOSIS — H2511 Age-related nuclear cataract, right eye: Secondary | ICD-10-CM | POA: Diagnosis not present

## 2015-06-30 ENCOUNTER — Other Ambulatory Visit: Payer: Self-pay

## 2015-06-30 ENCOUNTER — Ambulatory Visit (HOSPITAL_COMMUNITY): Payer: Medicare Other | Attending: Cardiology

## 2015-06-30 DIAGNOSIS — R0602 Shortness of breath: Secondary | ICD-10-CM | POA: Insufficient documentation

## 2015-06-30 DIAGNOSIS — R5383 Other fatigue: Secondary | ICD-10-CM | POA: Diagnosis not present

## 2015-06-30 DIAGNOSIS — I340 Nonrheumatic mitral (valve) insufficiency: Secondary | ICD-10-CM | POA: Diagnosis not present

## 2015-06-30 DIAGNOSIS — I517 Cardiomegaly: Secondary | ICD-10-CM | POA: Insufficient documentation

## 2015-06-30 DIAGNOSIS — R0600 Dyspnea, unspecified: Secondary | ICD-10-CM | POA: Diagnosis present

## 2015-06-30 DIAGNOSIS — I358 Other nonrheumatic aortic valve disorders: Secondary | ICD-10-CM | POA: Diagnosis not present

## 2015-07-02 ENCOUNTER — Other Ambulatory Visit: Payer: Self-pay

## 2015-07-02 NOTE — Patient Outreach (Addendum)
[place] [facility]) Care Management  07/02/2015  SOHIL ELIZARDO [birth year] [id number]   Telephonic Monthly Assessment  Inbound call from patient. Patient returning RN CM previously attempted calls.   Referral Date: 03/23/2015 Referral Source: [facility] Pneumonia Program Issue: PNA  Providers: Primary MD:  [name]. [place] - last appt 05/20/16 Sleep Study Consult appt with [name] 09/15/15 Pain Management MD:  [name] - next appt 07/06/2015 Eye MD:  Dr. [name] Insurance:  [place] Visit scheduled for 07/24/15  Social:  75 yo Patient living in his home with his wife.  Mobility: Patient states he is active. Patient is HOH and has a hearing aide.  Falls: None  Pain: chronic back pain issues and is on a pain management regime managed by pain MD. Depression: states his family tell him quite often he is depressed but he does not really recognize it as depression.  Caregiver: Wife, [name] Advance Directives: Yes DME: Hearing aide, reading glasses, CBG meter, BP cuff, scales, back brace  HTN Conditions:  DM, HTN,  Admission: (1) 03/17/2015 -03/18/2015 - PNA  HTN H/o MI 10/29/2011 and stroke 10/29/2011 H/o MD, Dr [name] BP Target and plan:130/80 BP running 127-140's BP 140/64 on MD appt 05/21/15.  H/o Emmi Ed materials on HTN management (reviewed 04/24/2015) -About Hight Blood Pressure (Hypertension) -High Blood Pressure (Hypertension): What You Can Do -Low-Salt Diet   H/o Per Epic 04/20/15 Glucose elevated at 141 and down to 119 and A1C 6.0 on 05/21/15.  H/o Patient  drinks regular cola's.  Weight 187 home weight. Last MD office weight 05/20/16 194 fully dressed.  BMI  30.38 H/o Emmi Educational materials sent to patient 04/24/15 (reviewed [id number]) -Metabolic Syndrome: Am I At Risk -Metabolic Syndrome: What You Can Do -Counting Carbohydrates  Sleep Study:  Sleep Study Consult appt with [name] scheduled for  09/15/14. H/o Patient followed up with Primary MD: Dr. [name] 05/21/15 with C/O fatigue, shortness of breath and nodding off during the day. Sleep Study ordered by Dr. [name].  Patient  has decided not to have the sleep study because "I know I will not wear the sleep mask." States he does not believe he has a sleep problem. States his nodding off has improved since 05/21/15. States he has always slept well with no problems sleeping. States he has snored all his life. States he has slept in a recliner for about 10 years and recently tried to sleep in his bed but he woke up about 20 times that night due to pain. H/o RN CM provided education on importance of following MDs recommendations for sleep study. Advised that patient can make more educated decision based off of results. Advised if testing comes back normal that patient may not need sleep mask. Provided education on health risk associated with sleep disorders.  RN CM provided patient education on importance of getting back into his bed for rest rather than a recliner.  Discussed increased risk of pooling secretions, circulation, pressure points, proper rest and pain management.  Patient verbalized improved understanding following discussion and stated he would agree to schedule his study.  Patient states plan to request his study be completed in his home.  [facility] Educational materials mailed 06/04/2015 (Reviewed 07/02/2015) -What Happens During A Sleep Study (Ploysomnogram) -About Sleep Apnea (OSA) -Why Have a Sleep Study (Polysomnogram) -Why Treat Sleep Apnea? 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 States  he does not plan to ever have surgery and manages with pain medication regime. Percocet 7.5-325 mg 1 tablet morning and bedtime (may take up to 3 tablets a day but avoids doing this unless needed).  Tizanidine (Zanaflex) 4 mg tablet at bedtime Melatonin 3  mg tablet at bedtime.  Back brace as needed Patient is followed by a Pain Management MD - next appt with [name] 07/06/15.   Patient c/o increased osteoarthritic pain in knees. RN CM provided education on benefits of weight loss to help self manage pain and stress on joints.   PLAN:  Referral: 03/23/2015 Case Management Start Date: 03/25/2015 Program: HTN 03/25/2015.  [facility] PNA Program: 04/01/2015 - 04/02/2015 Emmi PNA follow-up and Initial Assessment completed 03/26/15.   RN CM will continue to follow patient's compliance with sleep study order and assist with any care coordination services as needed.  Patient continues to need supportive encouragement to remain compliant with sleep study recommendations.   RN CM discussed weight loss goal of 2 lbs over the next month.  RN CM provided tips on sugar reduction from diet and food selections.  RN CM encouraged that weight management will help improve BP, BS readings and pain management.  Patient appreciative of RN CM services.    Quarterly update routed to Dr. [name] Continue to work on the following barriers: -diet (lower sugar intake) -weight loss goals 2 lbs this month minimally to help manage BP, BS, snoring and OA in knees.  -Sleep Study Recommendations compliance  - working toward getting patient to transition from recliner back to his bed for sleeping.   RN CM will scheduled next outreach for Monthly Assessment within one monthand as needed for care coordination needs.  RN CM advised to please notify MD of any changes in condition prior to scheduled appt's.  RN CM provided contact name and # [phone] or main office # [phone] and 24-hour nurse line # [phone].  RN CM confirmed patient is aware of 911 services for urgent emergency needs.  Mariann Laster, RN, BSN, [facility], CCM  [employer] Management Coordinator [phone] Direct [phone] Cell [phone]  Office [phone] Fax

## 2015-07-06 DIAGNOSIS — M47817 Spondylosis without myelopathy or radiculopathy, lumbosacral region: Secondary | ICD-10-CM | POA: Diagnosis not present

## 2015-07-06 DIAGNOSIS — M5137 Other intervertebral disc degeneration, lumbosacral region: Secondary | ICD-10-CM | POA: Diagnosis not present

## 2015-07-06 DIAGNOSIS — G894 Chronic pain syndrome: Secondary | ICD-10-CM | POA: Diagnosis not present

## 2015-07-06 DIAGNOSIS — Z79899 Other long term (current) drug therapy: Secondary | ICD-10-CM | POA: Diagnosis not present

## 2015-07-06 DIAGNOSIS — M4696 Unspecified inflammatory spondylopathy, lumbar region: Secondary | ICD-10-CM | POA: Diagnosis not present

## 2015-07-12 ENCOUNTER — Other Ambulatory Visit: Payer: Self-pay | Admitting: Family Medicine

## 2015-07-14 ENCOUNTER — Telehealth: Payer: Self-pay

## 2015-07-14 ENCOUNTER — Other Ambulatory Visit: Payer: Self-pay

## 2015-07-14 MED ORDER — DIAZEPAM 5 MG PO TABS: 2.5000 mg | ORAL_TABLET | Freq: Four times a day (QID) | ORAL | Status: DC | PRN

## 2015-07-14 NOTE — Telephone Encounter (Signed)
Refill ok? 

## 2015-07-14 NOTE — Telephone Encounter (Signed)
Yes thanks 

## 2015-07-14 NOTE — Telephone Encounter (Signed)
Medication faxed to mail order.

## 2015-07-14 NOTE — Telephone Encounter (Signed)
Pt requesting diazepam (VALIUM) 5 MG tablet Pt last visit 05/21/15 Pt last Rx refill 02/06/15 #40 with 2 refills

## 2015-07-30 ENCOUNTER — Other Ambulatory Visit: Payer: Self-pay

## 2015-07-30 ENCOUNTER — Ambulatory Visit: Payer: Medicare Other

## 2015-07-30 NOTE — Patient Outreach (Addendum)
[name]) Care Management  07/30/2015  Kevin [name] Jul 23, 1940 [id number]   Telephonic Monthly Assessment  Referral Date: 03/23/2015 Referral Source: [facility] Pneumonia Program Issue: PNA  Providers: Primary MD: [name]. Kevin [name] - last appt 05/21/15  - next appt 11/2015 Sleep Study Consult appt with [name] 09/15/15 (patient has canceled; elects not to proceed).  Pain Management MD: [name] - next appt 08/03/2015 Eye MD: Dr. [name] Insurance: [place] Call Visit:  07/24/15    Social:  75 yo Patient living in his home with his wife.  Mobility: Patient states he is active. Patient is HOH and has a hearing aide.  Falls: None  Pain: chronic back pain issues and is on a pain management regime managed by pain MD.  MD has decreased Oxycodone from 7.5 mg to 5 mg twice a day.  Patient confirms doing ok with decreased dosage other than noting some arthritic type discomfort.  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  H/o Patient drinks regular cola's.  Weight 191 on new  home scale.  H/o [facility] Educational materials sent to patient 04/24/15 (reviewed 09/02/5033) -Metabolic Syndrome: Am I At Risk -Metabolic Syndrome: What You Can Do -Counting Carbohydrates  Sleep Study:  H/o Patient followed up with Primary MD: Dr. Yong [name] 05/21/15 with C/O fatigue, shortness of breath and nodding off during the day. Sleep Study ordered by Dr.  Yong [name]. Patient elects not to complete sleep study even after open discussion and providing patient with [facility] education (See further details in 07/02/2015 note).  [facility] Educational materials mailed 06/04/2015 (Reviewed 07/02/2015) -What Happens During A Sleep Study (Ploysomnogram) -About Sleep Apnea (OSA) -Why Have a Sleep Study (Polysomnogram) -Why Treat Sleep Apnea? -After The Sleep Study (Ploysomnogram)   Medications:  Medications Reviewed Today    Reviewed by Standley Brooking, RN (Registered Nurse) on 07/30/15 at [place] List Status: <None>   Medication Order Taking? Sig Documenting Provider Last Dose Status Informant   amLODipine (NORVASC) 10 MG tablet [id number] Yes Take 1 tablet by mouth  daily Marin Olp, MD Taking Active    atorvastatin (LIPITOR) 20 MG tablet [id number] Yes Take 1 tablet by mouth  daily Marin Olp, MD Taking Active    bisoprolol (ZEBETA) 10 MG tablet [id number] Yes Take 1 tablet (10 mg total) by mouth daily. Marin Olp, MD Taking Active    Cholecalciferol (VITAMIN D-3) 1000 UNITS CAPS [id number] Yes Take 1 capsule by mouth daily. Historical Provider, MD Taking Active Multiple Informants   clopidogrel (PLAVIX) 75 MG tablet [id number] Yes Take 1 tablet by mouth  daily Marin Olp, MD Taking Active    diazepam (VALIUM) 5 MG tablet [id number] No Take 0.5-1 tablets (2.5-5 mg total) by mouth every 6 (six) hours as needed for muscle spasms or sedation.  Patient not taking:  Reported on 07/30/2015   Marin Olp, MD Not Taking Active    fenofibrate (TRICOR) 145 MG tablet [id number] Yes Take 1 tablet by mouth  daily Marin Olp, MD Taking Active Multiple Informants   gabapentin (NEURONTIN) 300 MG capsule [id number] Yes Take 300 mg by mouth 2 (two) times daily. Historical Provider, MD Taking Active Multiple Informants             Med Note [name], [name]   Tue Mar 17, 2015 12:47 PM): Received from: External Pharmacy Received Sig:     Melatonin 3 MG  TABS [id number] Yes Take 3 mg by mouth at bedtime.  Historical Provider, MD Taking Active Multiple Informants   methocarbamol (ROBAXIN) 500 MG tablet [id number] Yes Take 500 mg by mouth 2 (two) times daily. Historical Provider, MD Taking Active Multiple Informants   oxyCODONE-acetaminophen (PERCOCET) 7.5-325 MG per tablet [id number] No Take 1 tablet by mouth 3 (three) times daily. Reported on 07/30/2015 Historical Provider, MD Not Taking Active Multiple Informants             Med Note [name], [name]   Thu Jul 30, 2015  2:55 PM): Pain MD has decreased dosage to 5 mg twice a day.     oxyCODONE-acetaminophen (PERCOCET/ROXICET) 5-325 MG tablet [id number] Yes Take 1 tablet by mouth 2 (two) times daily. Historical Provider, MD Taking Active Self   tamsulosin (FLOMAX) 0.4 MG CAPS capsule [id number] Yes Take 1 capsule by mouth  daily Marin Olp, MD Taking Active    tiZANidine (ZANAFLEX) 4 MG tablet [id number] Yes Take 1 tablet by mouth  every 6 hours as needed for muscle spasms [name], MD Taking Active    vitamin B-12 (CYANOCOBALAMIN) 1000 MCG tablet [id number] Yes Take 1,000 mcg by mouth daily. Historical Provider, MD Taking Active Multiple Informants           [facility] CM Care Plan Problem One        Most Recent Value   Care Plan Problem One  HTN   Role Documenting the Problem One  Care Management Telephonic Coordinator   Care Plan for Problem One  Active   THN Long Term Goal (31-90 days)  Patient will stablize BP readings with self management interventions within 90 days.    THN Long Term Goal Start Date  06/04/15   THN Long Term Goal Met Date  07/30/15   Interventions for Problem One Long Term Goal  RN CM will provide education on HTN managment with weight, diet, medication and exercise management over the next 90 days.    THN CM Short Term Goal #1 (0-30 days)  Patient will decrease weight by 2 lbs over the next 30 days.    THN CM Short Term Goal #1 Start Date  06/04/15   [facility] CM  Short Term Goal #1 Met Date  07/30/15 [Patient has new scales and continues to work on [employer] manag]   Interventions for Short Term Goal #1  RN CM will provide educationon on the effects of weight on Blood pressure readings with the next 30 days.     THN CM Care Plan Problem Two        Most Recent Value   Care Plan Problem Two  Possible symptoms of OSA.     Role Documenting the Problem Two  Care Management Telephonic Coordinator   Care Plan for Problem Two  Active   THN CM Short Term Goal #1 (0-30 days)  Patient will follow MD recommendations for OSA sleep study testing over the next 30 days.    THN CM Short Term Goal #1 Start Date  06/04/15   [facility] CM Short  Term Goal #1 Met Date   -- [Goal not met: patient refuses testing. ]   Interventions for Short Term Goal #2   RN CM will provide patient with [employer] and verbal education within the next 30 days to encourage compliance with testing.     THN CM Care Plan Problem Three        Most Recent Value   Care Plan Problem Three  Diet modification needed to improve elevated glucose reading.    Role Documenting the Problem Three  Care Management Telephonic Coordinator   Care Plan for Problem Three  Active   THN CM Short Term Goal #1 (0-30 days)  Patient will avoid foods with high sugar content and salt over the holidays and next 30 days    THN CM Short Term Goal #1 Start Date  03/26/15   [facility] CM Short Term Goal #1 Met Date  07/30/15   Interventions for Short Term Goal #1  RN CM will provide education on tips on self management to avoid risk of diabetes within the next 30 days.       PLAN:  Referral: 03/23/2015 Case Management Start Date: 03/25/2015 - 07/30/2015 Program: HTN 03/25/2015 - 07/30/2015 [facility] PNA Program: 04/01/2015 - 04/02/2015 [facility] PNA follow-up and Initial Assessment completed 03/26/15.  Quarterly update routed to Dr. Yong [name] 07/02/2015  RN CM discussed option of Health Coach for Diabetes Education continuation;  patient states he is doing well and has no further needs at this time.  RN CM advised goals of care met and will close case today.  RN CM notified [employer] Assistant:  Closed RN CM notified Dr. Yong [name] via case closure letter.   RN CM advised to please notify MD of any changes in condition prior to scheduled appt's.  RN CM provided contact name and # [phone] or main office # [phone] and 24-hour nurse line # [phone].  RN CM confirmed patient is aware of 911 services for urgent emergency needs.  Mariann Laster, RN, BSN, [facility], CCM  [employer] Management Coordinator [phone] Direct [phone] Cell [phone] Office [phone] Fax

## 2015-07-30 NOTE — Addendum Note (Signed)
Addended by: Standley Brooking on: 07/30/2015 03:17 PM   Modules accepted: Medications

## 2015-08-04 ENCOUNTER — Telehealth: Payer: Self-pay | Admitting: Family Medicine

## 2015-08-04 MED ORDER — FENOFIBRATE 160 MG PO TABS: 160.0000 mg | ORAL_TABLET | Freq: Every day | ORAL | Status: DC

## 2015-08-04 NOTE — Telephone Encounter (Signed)
See below

## 2015-08-04 NOTE — Telephone Encounter (Signed)
Called and lm on pt vm with below message. 

## 2015-08-04 NOTE — Telephone Encounter (Signed)
Pt said he is no longer going to take this drug fenofibrate (TRICOR) 145 MG tablet cost 45.00 and he not going to pay that   He is asking if he can have 160 mg of the above medicine it cost less money  And if Dr [name] think it is ok     Pharmacy [facility]

## 2015-08-04 NOTE — Telephone Encounter (Signed)
Trial the 160mg  version- I sent this in.   Please let us know if any muscle aches increase on this version.

## 2015-08-31 ENCOUNTER — Encounter: Payer: Self-pay | Admitting: Family Medicine

## 2015-08-31 ENCOUNTER — Other Ambulatory Visit: Payer: Self-pay

## 2015-08-31 ENCOUNTER — Ambulatory Visit (INDEPENDENT_AMBULATORY_CARE_PROVIDER_SITE_OTHER): Payer: Medicare Other | Admitting: Family Medicine

## 2015-08-31 VITALS — BP 140/72 | HR 54 | Temp 97.9°F | Wt 200.0 lb

## 2015-08-31 DIAGNOSIS — G894 Chronic pain syndrome: Secondary | ICD-10-CM | POA: Diagnosis not present

## 2015-08-31 DIAGNOSIS — M5137 Other intervertebral disc degeneration, lumbosacral region: Secondary | ICD-10-CM | POA: Diagnosis not present

## 2015-08-31 DIAGNOSIS — R600 Localized edema: Secondary | ICD-10-CM

## 2015-08-31 DIAGNOSIS — M25569 Pain in unspecified knee: Secondary | ICD-10-CM | POA: Diagnosis not present

## 2015-08-31 DIAGNOSIS — I5033 Acute on chronic diastolic (congestive) heart failure: Secondary | ICD-10-CM | POA: Diagnosis not present

## 2015-08-31 DIAGNOSIS — Z79891 Long term (current) use of opiate analgesic: Secondary | ICD-10-CM | POA: Diagnosis not present

## 2015-08-31 DIAGNOSIS — I5030 Unspecified diastolic (congestive) heart failure: Secondary | ICD-10-CM | POA: Insufficient documentation

## 2015-08-31 DIAGNOSIS — Z79899 Other long term (current) drug therapy: Secondary | ICD-10-CM | POA: Diagnosis not present

## 2015-08-31 DIAGNOSIS — M47817 Spondylosis without myelopathy or radiculopathy, lumbosacral region: Secondary | ICD-10-CM | POA: Diagnosis not present

## 2015-08-31 LAB — CBC
HCT: 36.7 % — ABNORMAL LOW (ref 39.0–52.0)
Hemoglobin: 12.2 g/dL — ABNORMAL LOW (ref 13.0–17.0)
MCHC: 33.3 g/dL (ref 30.0–36.0)
MCV: 88.8 fl (ref 78.0–100.0)
Platelets: 203 10*3/uL (ref 150.0–400.0)
RBC: 4.13 Mil/uL — AB (ref 4.22–5.81)
RDW: 14.7 % (ref 11.5–15.5)
WBC: 7.1 10*3/uL (ref 4.0–10.5)

## 2015-08-31 LAB — POC URINALSYSI DIPSTICK (AUTOMATED)
Bilirubin, UA: NEGATIVE
Blood, UA: NEGATIVE
Glucose, UA: NEGATIVE
KETONES UA: NEGATIVE
LEUKOCYTES UA: NEGATIVE
NITRITE UA: NEGATIVE
PROTEIN UA: NEGATIVE
SPEC GRAV UA: 1.015
UROBILINOGEN UA: 0.2
pH, UA: 6

## 2015-08-31 LAB — COMPREHENSIVE METABOLIC PANEL
ALBUMIN: 4.3 g/dL (ref 3.5–5.2)
ALK PHOS: 31 U/L — AB (ref 39–117)
ALT: 15 U/L (ref 0–53)
AST: 19 U/L (ref 0–37)
BUN: 21 mg/dL (ref 6–23)
CO2: 26 mEq/L (ref 19–32)
Calcium: 9.2 mg/dL (ref 8.4–10.5)
Chloride: 105 mEq/L (ref 96–112)
Creatinine, Ser: 1.89 mg/dL — ABNORMAL HIGH (ref 0.40–1.50)
GFR: 36.97 mL/min — ABNORMAL LOW (ref >60.00)
GLUCOSE: 112 mg/dL — AB (ref 70–99)
Potassium: 4.7 mEq/L (ref 3.5–5.1)
SODIUM: 138 meq/L (ref 135–145)
TOTAL PROTEIN: 7 g/dL (ref 6.0–8.3)
Total Bilirubin: 0.6 mg/dL (ref 0.2–1.2)

## 2015-08-31 LAB — TSH: TSH: 2.04 u[IU]/mL (ref 0.35–4.50)

## 2015-08-31 MED ORDER — FUROSEMIDE 20 MG PO TABS: 20.0000 mg | ORAL_TABLET | ORAL | Status: DC

## 2015-08-31 MED ORDER — FUROSEMIDE 20 MG PO TABS: 20.0000 mg | ORAL_TABLET | Freq: Every day | ORAL | Status: DC

## 2015-08-31 NOTE — Progress Notes (Signed)
Garret Reddish, MD  Subjective:  Kevin [name] is a 75 y.o. year old very pleasant male patient who presents for/with See problem oriented charting ROS- see ros below  Past Medical History-  Patient Active Problem List   Diagnosis Date Noted  . CKD (chronic kidney disease), stage III 01/27/2014    Priority: High  . PVD (peripheral vascular disease) ([place]) 01/23/2014    Priority: High  . AAA (abdominal aortic aneurysm, ruptured) ([place]) 01/17/2013    Priority: High  . History of CVA (cerebrovascular accident) 11/17/2011    Priority: High  . Chronic pain syndrome 12/23/2009    Priority: High  . Personal history of colonic polyps 05/15/2013    Priority: Medium  . Carotid Artery Stenosis L s/p endarterectomy 01/12/2012    Priority: Medium  . Essential tremor 10/05/2009    Priority: Medium  . UNSPECIFIED ANEMIA 12/05/2008    Priority: Medium  . BPH (benign prostatic hyperplasia) 06/04/2007    Priority: Medium  . Fasting hyperglycemia 02/05/2007    Priority: Medium  . Hyperlipidemia 01/03/2007    Priority: Medium  . Essential hypertension 01/03/2007    Priority: Medium  . Family history of malignant neoplasm of gastrointestinal tract 05/15/2013    Priority: Low  . Inguinal hernia 05/01/2012    Priority: Low  . BURSITIS, HIP 12/21/2009    Priority: Low  . ACTINIC KERATOSIS, EAR, LEFT 03/09/2009    Priority: Low  . Osteoarthritis of left knee 11/02/2007    Priority: Low  . 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 Diastolic CHF ([place]) [id number]  . CAP (community acquired pneumonia) 03/17/2015    Medications- reviewed and updated Current Outpatient Prescriptions  Medication Sig Dispense Refill  . amLODipine (NORVASC) 10 MG tablet Take 1 tablet by mouth  daily 90 tablet 3  . atorvastatin (LIPITOR) 20 MG tablet Take 1 tablet by mouth  daily 90 tablet 3  . bisoprolol (ZEBETA) 10 MG tablet Take 1 tablet (10 mg total) by mouth daily. 90 tablet 3  .  Cholecalciferol (VITAMIN D-3) 1000 UNITS CAPS Take 1 capsule by mouth daily.    . clopidogrel (PLAVIX) 75 MG tablet Take 1 tablet by mouth  daily 90 tablet 3  . fenofibrate 160 MG tablet Take 1 tablet (160 mg total) by mouth daily. 90 tablet 3  . gabapentin (NEURONTIN) 300 MG capsule Take 300 mg by mouth 2 (two) times daily.    . Melatonin 3 MG TABS Take 3 mg by mouth at bedtime.     . methocarbamol (ROBAXIN) 500 MG tablet Take 500 mg by mouth 2 (two) times daily.    Marland Kitchen oxyCODONE-acetaminophen (PERCOCET/ROXICET) 5-325 MG tablet Take 1 tablet by mouth 2 (two) times daily.    . tamsulosin (FLOMAX) 0.4 MG CAPS capsule Take 1 capsule by mouth  daily 90 capsule 3  . tiZANidine (ZANAFLEX) 4 MG tablet Take 1 tablet by mouth  every 6 hours as needed for muscle spasms 90 tablet 1  . vitamin B-12 (CYANOCOBALAMIN) 1000 MCG tablet Take 1,000 mcg by mouth daily.    . diazepam (VALIUM) 5 MG tablet Take 0.5-1 tablets (2.5-5 mg total) by mouth every 6 (six) hours as needed for muscle spasms or sedation. (Patient not taking: Reported on 07/30/2015) 40 tablet 2   No current facility-administered medications for this visit.    Objective: BP 140/72 mmHg  Pulse 54  Temp(Src) 97.9 F (36.6 C)  Wt 200 lb (90.719 kg) Gen: NAD, resting comfortably  CV: RRR no murmurs rubs or gallops Lungs: CTAB no crackles, wheeze, rhonchi Abdomen: soft/nontender/nondistended/normal bowel sounds. No rebound or guarding.  Ext: 1+ edema pitting Skin: warm, dry Neuro: grossly normal, moves all extremities  Assessment/Plan:  Edema Suspected diastolic CHF (acute on chronic) S: ankle swelling for 3 weeks. Not worsening since it started but he has had issues in the past with this. Had been complaining of SOb previously but not worsening. On 06/30/15 had echo which showed EF 55% and grade I diastolic dysfunciton. Has had some recent weight gain- from December visit on our scales up 6 lbs. States he had been on lasix years ago Wt  Readings from Last 3 Encounters:  08/31/15 200 lb (90.719 kg)  07/30/15 191 lb (86.637 kg)  07/02/15 187 lb (84.823 kg)  ROS- no shortness of breath, always sleeps in a chair- so tough to tell if orthopnea, no PND. Patient denies chest pain,recent immobilization, history DVT, active cancer, unilateral calf swelling, superficial veins present, swelling of entire leg, localized tenderness in calf, pitting edema greater in symptomatic leg, paralysis.   A/P: I suspect swelling is related to diastolic CHF (weight gain, now 1+ edema in patient prior on lasix). We will get labs to check into other factors (anemia, albumin, protein in urine, renal function?). Doubt DVT as bilaterally. Would have to be cautious with lasix use given CKD. If start lasix- return in 10 days for repeat BMET to watch potassium and kidney function. Would discuss salt restriction at follow up as well.   Return precautions advised.   Orders Placed This Encounter  Procedures  . CBC    [place]  . Comprehensive metabolic panel    [place]  . TSH    [place]  . POCT Urinalysis Dipstick (Automated)

## 2015-08-31 NOTE — Patient Instructions (Signed)
Blood and urine before you go  I am likely to start you on lasix (a fluid pill) and see you back in about 10 days once we have results from your labs.   I think you are backing up fluid from your heart not relaxing as effectively as it once did.   Keep an eye on your weight- if you continue to gain weight (especially 3 lbs in a day or 5 lbs in 3 days) or have worsening swelling or get short of breath- I need to see you back immediately

## 2015-09-01 ENCOUNTER — Other Ambulatory Visit: Payer: Self-pay

## 2015-09-04 ENCOUNTER — Telehealth: Payer: Self-pay | Admitting: Family Medicine

## 2015-09-04 NOTE — Telephone Encounter (Signed)
Clarification on 2 different Rx that was sent the same day for lasix with different directions on both.  Call back to [phone] and give reference # below.  Reference [id number]

## 2015-09-04 NOTE — Telephone Encounter (Signed)
This has been handled as of yesterday.

## 2015-09-09 ENCOUNTER — Encounter: Payer: Self-pay | Admitting: Family

## 2015-09-09 ENCOUNTER — Institutional Professional Consult (permissible substitution): Payer: Medicare Other | Admitting: Internal Medicine

## 2015-09-10 ENCOUNTER — Ambulatory Visit (INDEPENDENT_AMBULATORY_CARE_PROVIDER_SITE_OTHER)
Admission: RE | Admit: 2015-09-10 | Discharge: 2015-09-10 | Disposition: A | Discharge status: Home / Self Care | Payer: Medicare Other | Source: Ambulatory Visit | Attending: Family Medicine | Admitting: Family Medicine

## 2015-09-10 ENCOUNTER — Telehealth: Payer: Self-pay | Admitting: Family Medicine

## 2015-09-10 ENCOUNTER — Other Ambulatory Visit: Payer: Self-pay | Admitting: Family Medicine

## 2015-09-10 DIAGNOSIS — M179 Osteoarthritis of knee, unspecified: Secondary | ICD-10-CM | POA: Diagnosis not present

## 2015-09-10 DIAGNOSIS — M25562 Pain in left knee: Secondary | ICD-10-CM

## 2015-09-10 NOTE — Telephone Encounter (Signed)
Called pharmacy and verified pt Rx.

## 2015-09-10 NOTE — Telephone Encounter (Signed)
[facility] said [facility] will not release furosemide until someone call them

## 2015-09-10 NOTE — Telephone Encounter (Signed)
Spoke with [facility].

## 2015-09-10 NOTE — Telephone Encounter (Signed)
Pt would like a call back with results of labs done last week.

## 2015-09-17 ENCOUNTER — Other Ambulatory Visit: Payer: Self-pay

## 2015-09-17 ENCOUNTER — Encounter: Payer: Self-pay | Admitting: Family

## 2015-09-17 ENCOUNTER — Ambulatory Visit (INDEPENDENT_AMBULATORY_CARE_PROVIDER_SITE_OTHER)
Admission: RE | Admit: 2015-09-17 | Discharge: 2015-09-17 | Disposition: A | Discharge status: Home / Self Care | Payer: Medicare Other | Source: Ambulatory Visit | Attending: Family | Admitting: Family

## 2015-09-17 ENCOUNTER — Ambulatory Visit (HOSPITAL_COMMUNITY)
Admission: RE | Admit: 2015-09-17 | Discharge: 2015-09-17 | Disposition: A | Discharge status: Home / Self Care | Payer: Medicare Other | Source: Ambulatory Visit | Attending: Family | Admitting: Family

## 2015-09-17 ENCOUNTER — Ambulatory Visit (INDEPENDENT_AMBULATORY_CARE_PROVIDER_SITE_OTHER): Payer: Medicare Other | Admitting: Family

## 2015-09-17 VITALS — BP 143/71 | HR 49 | Ht 67.0 in | Wt 202.3 lb

## 2015-09-17 DIAGNOSIS — I714 Abdominal aortic aneurysm, without rupture, unspecified: Secondary | ICD-10-CM

## 2015-09-17 DIAGNOSIS — Z8679 Personal history of other diseases of the circulatory system: Secondary | ICD-10-CM | POA: Insufficient documentation

## 2015-09-17 DIAGNOSIS — N189 Chronic kidney disease, unspecified: Secondary | ICD-10-CM | POA: Insufficient documentation

## 2015-09-17 DIAGNOSIS — Z87891 Personal history of nicotine dependence: Secondary | ICD-10-CM | POA: Diagnosis not present

## 2015-09-17 DIAGNOSIS — Z48812 Encounter for surgical aftercare following surgery on the circulatory system: Secondary | ICD-10-CM

## 2015-09-17 DIAGNOSIS — I739 Peripheral vascular disease, unspecified: Secondary | ICD-10-CM

## 2015-09-17 DIAGNOSIS — E785 Hyperlipidemia, unspecified: Secondary | ICD-10-CM | POA: Insufficient documentation

## 2015-09-17 DIAGNOSIS — I129 Hypertensive chronic kidney disease with stage 1 through stage 4 chronic kidney disease, or unspecified chronic kidney disease: Secondary | ICD-10-CM | POA: Insufficient documentation

## 2015-09-17 DIAGNOSIS — Z9889 Other specified postprocedural states: Secondary | ICD-10-CM | POA: Insufficient documentation

## 2015-09-17 DIAGNOSIS — I6521 Occlusion and stenosis of right carotid artery: Secondary | ICD-10-CM | POA: Insufficient documentation

## 2015-09-17 DIAGNOSIS — I6523 Occlusion and stenosis of bilateral carotid arteries: Secondary | ICD-10-CM

## 2015-09-17 DIAGNOSIS — R938 Abnormal findings on diagnostic imaging of other specified body structures: Secondary | ICD-10-CM | POA: Insufficient documentation

## 2015-09-17 NOTE — Patient Instructions (Signed)
Stroke Prevention Some medical conditions and behaviors are associated with an increased chance of having a stroke. You may prevent a stroke by making healthy choices and managing medical conditions. HOW CAN I REDUCE MY RISK OF HAVING A STROKE?   Stay physically active. Get at least 30 minutes of activity on most or all days.  Do not smoke. It may also be helpful to avoid exposure to secondhand smoke.  Limit alcohol use. Moderate alcohol use is considered to be:  No more than 2 drinks per day for men.  No more than 1 drink per day for nonpregnant women.  Eat healthy foods. This involves:  Eating 5 or more servings of fruits and vegetables a day.  Making dietary changes that address high blood pressure (hypertension), high cholesterol, diabetes, or obesity.  Manage your cholesterol levels.  Making food choices that are high in fiber and low in saturated fat, trans fat, and cholesterol may control cholesterol levels.  Take any prescribed medicines to control cholesterol as directed by your health care provider.  Manage your diabetes.  Controlling your carbohydrate and sugar intake is recommended to manage diabetes.  Take any prescribed medicines to control diabetes as directed by your health care provider.  Control your hypertension.  Making food choices that are low in salt (sodium), saturated fat, trans fat, and cholesterol is recommended to manage hypertension.  Ask your health care provider if you need treatment to lower your blood pressure. Take any prescribed medicines to control hypertension as directed by your health care provider.  If you are 18-39 years of age, have your blood pressure checked every 3-5 years. If you are 40 years of age or older, have your blood pressure checked every year.  Maintain a healthy weight.  Reducing calorie intake and making food choices that are low in sodium, saturated fat, trans fat, and cholesterol are recommended to manage  weight.  Stop drug abuse.  Avoid taking birth control pills.  Talk to your health care provider about the risks of taking birth control pills if you are over 35 years old, smoke, get migraines, or have ever had a blood clot.  Get evaluated for sleep disorders (sleep apnea).  Talk to your health care provider about getting a sleep evaluation if you snore a lot or have excessive sleepiness.  Take medicines only as directed by your health care provider.  For some people, aspirin or blood thinners (anticoagulants) are helpful in reducing the risk of forming abnormal blood clots that can lead to stroke. If you have the irregular heart rhythm of atrial fibrillation, you should be on a blood thinner unless there is a good reason you cannot take them.  Understand all your medicine instructions.  Make sure that other conditions (such as anemia or atherosclerosis) are addressed. SEEK IMMEDIATE MEDICAL CARE IF:   You have sudden weakness or numbness of the face, arm, or leg, especially on one side of the body.  Your face or eyelid droops to one side.  You have sudden confusion.  You have trouble speaking (aphasia) or understanding.  You have sudden trouble seeing in one or both eyes.  You have sudden trouble walking.  You have dizziness.  You have a loss of balance or coordination.  You have a sudden, severe headache with no known cause.  You have new chest pain or an irregular heartbeat. Any of these symptoms may represent a serious problem that is an emergency. Do not wait to see if the symptoms will   go away. Get medical help at once. Call your local emergency services (911 in [place] Do not drive yourself to the hospital.   This information is not intended to replace advice given to you by your health care provider. Make sure you discuss any questions you have with your health care provider.   Document Released: 06/23/2004 Document Revised: 06/06/2014 Document Reviewed:  11/16/2012 Elsevier Interactive Patient Education 2016 Elsevier Inc.    Peripheral Vascular Disease Peripheral vascular disease (PVD) is a disease of the blood vessels that are not part of your heart and brain. A simple term for PVD is poor circulation. In most cases, PVD narrows the blood vessels that carry blood from your heart to the rest of your body. This can result in a decreased supply of blood to your arms, legs, and internal organs, like your stomach or kidneys. However, it most often affects a person's lower legs and feet. There are two types of PVD.  Organic PVD. This is the more common type. It is caused by damage to the structure of blood vessels.  Functional PVD. This is caused by conditions that make blood vessels contract and tighten (spasm). Without treatment, PVD tends to get worse over time. PVD can also lead to acute ischemic limb. This is when an arm or limb suddenly has trouble getting enough blood. This is a medical emergency. CAUSES Each type of PVD has many different causes. The most common cause of PVD is buildup of a fatty material (plaque) inside of your arteries (atherosclerosis). Small amounts of plaque can break off from the walls of the blood vessels and become lodged in a smaller artery. This blocks blood flow and can cause acute ischemic limb. Other common causes of PVD include:  Blood clots that form inside of blood vessels.  Injuries to blood vessels.  Diseases that cause inflammation of blood vessels or cause blood vessel spasms.  Health behaviors and health history that increase your risk of developing PVD. RISK FACTORS  You may have a greater risk of PVD if you:  Have a family history of PVD.  Have certain medical conditions, including:  High cholesterol.  Diabetes.  High blood pressure (hypertension).  Coronary heart disease.  Past problems with blood clots.  Past injury, such as burns or a broken bone. These may have damaged blood  vessels in your limbs.  Buerger disease. This is caused by inflamed blood vessels in your hands and feet.  Some forms of arthritis.  Rare birth defects that affect the arteries in your legs.  Use tobacco.  Do not get enough exercise.  Are obese.  Are age 50 or older. SIGNS AND SYMPTOMS  PVD may cause many different symptoms. Your symptoms depend on what part of your body is not getting enough blood. Some common signs and symptoms include:  Cramps in your lower legs. This may be a symptom of poor leg circulation (claudication).  Pain and weakness in your legs while you are physically active that goes away when you rest (intermittent claudication).  Leg pain when at rest.  Leg numbness, tingling, or weakness.  Coldness in a leg or foot, especially when compared with the other leg.  Skin or hair changes. These can include:  Hair loss.  Shiny skin.  Pale or bluish skin.  Thick toenails.  Inability to get or maintain an erection (erectile dysfunction). People with PVD are more prone to developing ulcers and sores on their toes, feet, or legs. These may take longer than   normal to heal. DIAGNOSIS Your health care provider may diagnose PVD from your signs and symptoms. The health care provider will also do a physical exam. You may have tests to find out what is causing your PVD and determine its severity. Tests may include:  Blood pressure recordings from your arms and legs and measurements of the strength of your pulses (pulse volume recordings).  Imaging studies using sound waves to take pictures of the blood flow through your blood vessels (Doppler ultrasound).  Injecting a dye into your blood vessels before having imaging studies using:  X-rays (angiogram or arteriogram).  Computer-generated X-rays (CT angiogram).  A powerful electromagnetic field and a computer (magnetic resonance angiogram or MRA). TREATMENT Treatment for PVD depends on the cause of your condition  and the severity of your symptoms. It also depends on your age. Underlying causes need to be treated and controlled. These include long-lasting (chronic) conditions, such as diabetes, high cholesterol, and high blood pressure. You may need to first try making lifestyle changes and taking medicines. Surgery may be needed if these do not work. Lifestyle changes may include:  Quitting smoking.  Exercising regularly.  Following a low-fat, low-cholesterol diet. Medicines may include:  Blood thinners to prevent blood clots.  Medicines to improve blood flow.  Medicines to improve your blood cholesterol levels. Surgical procedures may include:  A procedure that uses an inflated balloon to open a blocked artery and improve blood flow (angioplasty).  A procedure to put in a tube (stent) to keep a blocked artery open (stent implant).  Surgery to reroute blood flow around a blocked artery (peripheral bypass surgery).  Surgery to remove dead tissue from an infected wound on the affected limb.  Amputation. This is surgical removal of the affected limb. This may be necessary in cases of acute ischemic limb that are not improved through medical or surgical treatments. HOME CARE INSTRUCTIONS  Take medicines only as directed by your health care provider.  Do not use any tobacco products, including cigarettes, chewing tobacco, or electronic cigarettes. If you need help quitting, ask your health care provider.  Lose weight if you are overweight, and maintain a healthy weight as directed by your health care provider.  Eat a diet that is low in fat and cholesterol. If you need help, ask your health care provider.  Exercise regularly. Ask your health care provider to suggest some good activities for you.  Use compression stockings or other mechanical devices as directed by your health care provider.  Take good care of your feet.  Wear comfortable shoes that fit well.  Check your feet often for  any cuts or sores. SEEK MEDICAL CARE IF:  You have cramps in your legs while walking.  You have leg pain when you are at rest.  You have coldness in a leg or foot.  Your skin changes.  You have erectile dysfunction.  You have cuts or sores on your feet that are not healing. SEEK IMMEDIATE MEDICAL CARE IF:  Your arm or leg turns cold and blue.  Your arms or legs become red, warm, swollen, painful, or numb.  You have chest pain or trouble breathing.  You suddenly have weakness in your face, arm, or leg.  You become very confused or lose the ability to speak.  You suddenly have a very bad headache or lose your vision.   This information is not intended to replace advice given to you by your health care provider. Make sure you discuss any questions   you have with your health care provider.   Document Released: 06/23/2004 Document Revised: 06/06/2014 Document Reviewed: 10/24/2013 Elsevier Interactive Patient Education 2016 Elsevier Inc.  

## 2015-09-17 NOTE — Progress Notes (Signed)
[facility] [place] HISTORY AND PHYSICAL   CC: follow up s/p AAA repair, PAOD,  and left CEA   History of Present Illness:   Kevin [name] is a 75 y.o. male patient of Dr. Oneida Alar who presents for routine follow up s/p open abdominal aortic aneurysm repair (2010, Dr. Oneida Alar) and left CEA (2006).  The patient has had left lower quadrant chronic abdominal pain that he attributes to an inguinal hernia, states Dr. Arnoldo Morale aware.  Has chronic intermittent right hip pain and low back pain, aggravated by certain activities; his hip pain was evaluated by Dr. Ricard Dillon, pt states he was told that his hip is fine but his lumbar spine has issues. He states pain management has not returned his phone call x2, has not been able to attend yet, is becoming discouraged with them.  Had TIA in 2013: mild left facial and left leg tingling that lasted about 30 minutes, was evaluated at [facility]. He denies any further TIA activity.  He denies non healing wounds. 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 His walking is limited by his low back pain, but still is able to mow his lawn, has to stop every 10 minutes.  He had a recent cardiac echo and saw his PCP re this. Pt states he has no known cardiac problems.   He has swelling in his feet and ankles for months that wife states resolves somewhat with overnight elevation; states this was addressed by his PCP.  Pt Diabetic: No Pt smoker: former smoker, quit in 1989  Pt meds include: Statin :Yes Betablocker: Yes ASA: No Other anticoagulants/antiplatelets: Plavix    Current Outpatient Prescriptions  Medication Sig Dispense Refill  . amLODipine (NORVASC) 10 MG tablet Take 1 tablet by mouth  daily 90 tablet 3  . atorvastatin (LIPITOR) 20 MG tablet Take 1 tablet by mouth  daily 90 tablet 3  . bisoprolol  (ZEBETA) 10 MG tablet Take 1 tablet (10 mg total) by mouth daily. 90 tablet 3  . Cholecalciferol (VITAMIN D-3) 1000 UNITS CAPS Take 1 capsule by mouth daily.    . clopidogrel (PLAVIX) 75 MG tablet Take 1 tablet by mouth  daily 90 tablet 3  . diazepam (VALIUM) 5 MG tablet Take 0.5-1 tablets (2.5-5 mg total) by mouth every 6 (six) hours as needed for muscle spasms or sedation. 40 tablet 2  . fenofibrate 160 MG tablet Take 1 tablet (160 mg total) by mouth daily. 90 tablet 3  . furosemide (LASIX) 20 MG tablet Take 1 tablet (20 mg total) by mouth every other day. 90 tablet 3  . gabapentin (NEURONTIN) 300 MG capsule Take 300 mg by mouth 2 (two) times daily.    . Melatonin 3 MG TABS Take 3 mg by mouth at bedtime.     . methocarbamol (ROBAXIN) 500 MG tablet Take 500 mg by mouth 2 (two) times daily.    Marland Kitchen oxyCODONE-acetaminophen (PERCOCET/ROXICET) 5-325 MG tablet Take 1 tablet by mouth 2 (two) times daily.    . tamsulosin (FLOMAX) 0.4 MG CAPS capsule Take 1 capsule by mouth  daily 90 capsule 3  . tiZANidine (ZANAFLEX) 4 MG tablet Take 1 tablet by mouth  every 6 hours as needed for muscle spasms 90 tablet 1  . vitamin B-12 (CYANOCOBALAMIN) 1000 MCG tablet Take 1,000 mcg by mouth daily.  No current facility-administered medications for this visit.    Past Medical History  Diagnosis Date  . Hyperlipidemia   . 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     . Arthritis     DDD- Lower Back  . Muscle spasm     takes Zanaflex daily as needed  . Insomnia     takes Melatonin nightly  . TIA (transient ischemic attack)     x 2;takes Plavix daily  . Rheumatoid arthritis ([place])   . Joint pain   . Joint swelling   . History of colon polyps   . Diverticulosis   . Urinary frequency     takes Flomax daily  . Urinary urgency   . History of blood transfusion     "probably when they did aortic aneurysm"  . Cataracts, bilateral     immature  . Rheumatic fever 1946  . Bradycardia   .  Hypertension   . TIA (transient ischemic attack) 10/2011  . Chronic lower back pain   . Chronic kidney disease   . Pneumonia     hx of   . CAP (community acquired pneumonia) 03/17/2015    "THAT'S WHAT THEY ARE THINKING; THEY ARE NOT SURE"  . Elevated lactic acid level 03/17/2015    Social History Social History  Substance Use Topics  . Smoking status: Former Smoker -- 2.00 packs/day for 28 years    Types: Cigarettes    Quit date: 05/30/1989  . Smokeless tobacco: Never Used     Comment: quit smoking "in my 17's"  . Alcohol Use: Yes     Comment: 03/17/2015 "might have 2 drinks/year'    Family History Family History  Problem Relation Age of Onset  . Parkinsonism Father   . Dementia Father   . Cancer Father     Throat  . Colon cancer Mother     died in 30s  . Cancer Mother     Colon    Surgical History Past Surgical History  Procedure Laterality Date  . Abdominal aortic aneurysm repair  2010  . Carotid endarterectomy Left 09/20/2004  . Tonsillectomy    . Colonoscopy    . Upper gi endoscopy    . Inguinal hernia repair Left 02/05/2013    Procedure: HERNIA REPAIR INGUINAL ADULT;  Surgeon: Joyice Faster. Cornett, MD;  Location: [facility];  Service: General;  Laterality: Left;  . Insertion of mesh Left 02/05/2013    Procedure: INSERTION OF MESH;  Surgeon: Joyice Faster. Cornett, MD;  Location: [facility];  Service: General;  Laterality: Left;  . Testiclar cyst excision Left   . Vasectomy    . Shoulder arthroscopy with rotator cuff repair and subacromial decompression Left 05/01/2014    Procedure: LEFT ARTHROSCOPY SHOULDER SUBACROMIAL DECOMPRESSION,DISTAL CLAVICAL RESECTION AND ROTATOR CUFF REPAIR;  Surgeon: Marin Shutter, MD;  Location: [facility];  Service: Orthopedics;  Laterality: Left;  . Inguinal hernia repair Left   . Refractive surgery      2016-2017    No Known Allergies  Current Outpatient Prescriptions  Medication Sig Dispense Refill  .  amLODipine (NORVASC) 10 MG tablet Take 1 tablet by mouth  daily 90 tablet 3  . atorvastatin (LIPITOR) 20 MG tablet Take 1 tablet by mouth  daily 90 tablet 3  . bisoprolol (ZEBETA) 10 MG tablet Take 1 tablet (10 mg total) by mouth daily. 90 tablet 3  . Cholecalciferol (VITAMIN D-3) 1000 UNITS CAPS Take 1 capsule by  mouth daily.    . clopidogrel (PLAVIX) 75 MG tablet Take 1 tablet by mouth  daily 90 tablet 3  . diazepam (VALIUM) 5 MG tablet Take 0.5-1 tablets (2.5-5 mg total) by mouth every 6 (six) hours as needed for muscle spasms or sedation. 40 tablet 2  . fenofibrate 160 MG tablet Take 1 tablet (160 mg total) by mouth daily. 90 tablet 3  . furosemide (LASIX) 20 MG tablet Take 1 tablet (20 mg total) by mouth every other day. 90 tablet 3  . gabapentin (NEURONTIN) 300 MG capsule Take 300 mg by mouth 2 (two) times daily.    . Melatonin 3 MG TABS Take 3 mg by mouth at bedtime.     . methocarbamol (ROBAXIN) 500 MG tablet Take 500 mg by mouth 2 (two) times daily.    Marland Kitchen oxyCODONE-acetaminophen (PERCOCET/ROXICET) 5-325 MG tablet Take 1 tablet by mouth 2 (two) times daily.    . tamsulosin (FLOMAX) 0.4 MG CAPS capsule Take 1 capsule by mouth  daily 90 capsule 3  . tiZANidine (ZANAFLEX) 4 MG tablet Take 1 tablet by mouth  every 6 hours as needed for muscle spasms 90 tablet 1  . vitamin B-12 (CYANOCOBALAMIN) 1000 MCG tablet Take 1,000 mcg by mouth daily.     No current facility-administered medications for this visit.     REVIEW OF SYSTEMS: See HPI for pertinent positives and negatives.  Physical Examination Filed Vitals:   09/17/15 1056 09/17/15 1113  BP: 150/72 143/71  Pulse: 49   Height: 5\' 7"  (1.702 m)   Weight: 202 lb 4.8 oz (91.763 kg)   SpO2: 95%    Body mass index is 31.68 kg/(m^2).  General: A&O x 3, WD, obese male.  Pulmonary: Sym exp, good air movt, CTAB, no rales, rhonchi, or wheezing.  Cardiac: RRR, Nl S1, S2, no detected murmurs   Vascular:  Vessel  Right  Left    Radial  2+Palpable  2+Palpable   Carotid  audible, without bruit  audible, without bruit   Aorta  Not palpable  N/A   Femoral  1+Palpable  2+Palpable   Popliteal  Not palpable  Not palpable   PT  Biphasic by Doppler Biphasic by Doppler  DP  1+ palpable,Biphasic by Doppler Monophasic by Doppler   Gastrointestinal: soft, NTND, -G/R, - HSM, - palpable masses, - CVAT B.  Musculoskeletal: M/S 5/5 throughout, Extremities without ischemic changes. Pitting edema in both feet and ankles: 2+ right, 1+ left.  Neurologic: Pain and light touch intact in extremities, Motor exam as listed above. CN 2-12 intact.                Non-Invasive Vascular Imaging (09/17/2015):   CEREBROVASCULAR DUPLEX EVALUATION    INDICATION: Carotid artery disease    PREVIOUS INTERVENTION(S): Left carotid endarterectomy 2006    DUPLEX EXAM: Carotid duplex    RIGHT  LEFT  Peak Systolic Velocities (cm/s) End Diastolic Velocities (cm/s) Plaque LOCATION Peak Systolic Velocities (cm/s) End Diastolic Velocities (cm/s) Plaque  56 8  CCA PROXIMAL 76 13 HT  34 9 HM CCA MID 84 16 HT  37 9 HT CCA DISTAL 90 19 HT  183 14 HT ECA 118 16 HM  477 128 HT ICA PROXIMAL 94 24 HM  144 39  ICA MID 94 24   85 25  ICA DISTAL 69 21     14.0 ICA / CCA Ratio (PSV) N/A  Antegrade Vertebral Flow Antegrade  - Brachial Systolic Pressure (mmHg) -  Triphasic Brachial  Artery Waveforms Triphasic    Plaque Morphology:  HM = Homogeneous, HT = Heterogeneous, CP = Calcific Plaque, SP = Smooth Plaque, IP = Irregular Plaque     ADDITIONAL FINDINGS: Multiphasic subclavian arteries    IMPRESSION: 1. 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 Patent left carotid endarterectomy site with no evidence for restenosis.    Compared to the previous exam:  Significant change on the right since exam 09/11/2014    ABI (Date: 09/17/2015)  R: 0.67 (0.70, 09/11/14), DP: monophasic, PT: monophasic,  TBI: 0.53  L: 0.80 (0.76), DP: monophasic, PT: monophasic, TBI: 0.48   ASSESSMENT:  Kevin [name] is a 75 y.o. male who is s/p  open abdominal aortic aneurysm repair (2010, Dr. Oneida Alar) and left CEA (2006).  He has had no TIA since 2013, has no claudication, has no signs of ischemia in his feet/legs. His walking is limited by his chronic low back pain, but still is able to mow his lawn, has to stop every 10 minutes.  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 Significant change on the right since exam 09/11/2014.   PLAN:   Dr. Oneida Alar spoke with pt and wife and examined pt. Based on today's exam and non-invasive vascular lab results, the patient will be referred for cardiology risk stratification and stress testing prior to right CEA by Dr. Oneida Alar.   I discussed in depth with the patient the nature of atherosclerosis, and emphasized the importance of maximal medical management including strict control of blood pressure, blood glucose, and lipid levels, obtaining regular exercise, and cessation of smoking.  The patient is aware that without maximal medical management the underlying atherosclerotic disease process will progress, limiting the benefit of any interventions.  The patient was given information about stroke prevention and what symptoms should prompt the patient to seek immediate medical care.  The patient was given information about PAD including signs, symptoms, treatment, what symptoms should prompt the patient to seek immediate medical care, and risk reduction measures to take. Thank you for allowing [place] to participate in this patient's care.  Clemon Chambers, RN, MSN, FNP-C [facility] Office: [phone]  Clinic MD: [facility]   09/17/2015 11:19 AM

## 2015-09-21 NOTE — Progress Notes (Signed)
Cardiology Office Note   Date:  09/22/2015   ID:  Kevin [name], Kevin [name] 04-07-1941, MRN [id number]  PCP:  Garret Reddish, MD  Cardiologist:   Lilee Aldea Meredith Leeds, MD    Chief Complaint  Patient presents with  . New Patient (Initial Visit)    surgical clearance     History of Present Illness: Kevin [name] is a 74 y.o. male who presents today for cardiology evaluation.   He has a history of open AAA repair, HLD, RA, HTN, TIA in 2013, left CEA 2006, and bradycardia.  He presents as a pre-op evaluation prior to right CEA.  He was a smoker and quit in 1989. He says that most the time, he feels well. He gets short of breath when he exerts himself. He says that he can walk about a block and then has to stop to catch his breath. He can mow his lawn but he has to do this with breaks in between. He does not necessarily get chest pain. He does sleep in a chair, but that is due to musculoskeletal complaints. He does say that he feels like he could lie flat without any issues.   Today, he denies symptoms of palpitations, chest pain, orthopnea, PND, lower extremity edema, claudication, dizziness, presyncope, syncope, bleeding, or neurologic sequela. The patient is tolerating medications without difficulties and is otherwise without complaint today.    Past Medical History  Diagnosis Date  . Hyperlipidemia   . 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     . Arthritis     DDD- Lower Back  . Muscle spasm     takes Zanaflex daily as needed  . Insomnia     takes Melatonin nightly  . TIA (transient ischemic attack)     x 2;takes Plavix daily  . Rheumatoid arthritis ([place])   . Joint pain   . Joint swelling   . History of colon polyps   . Diverticulosis   . Urinary frequency     takes Flomax daily  . Urinary urgency   . History of blood transfusion     "probably when they did aortic aneurysm"  . Cataracts, bilateral     immature  . Rheumatic fever 1946  . Bradycardia   .  Hypertension   . TIA (transient ischemic attack) 10/2011  . Chronic lower back pain   . Chronic kidney disease   . Pneumonia     hx of   . CAP (community acquired pneumonia) 03/17/2015    "THAT'S WHAT THEY ARE THINKING; THEY ARE NOT SURE"  . Elevated lactic acid level 03/17/2015   Past Surgical History  Procedure Laterality Date  . Abdominal aortic aneurysm repair  2010  . Carotid endarterectomy Left 09/20/2004  . Tonsillectomy    . Colonoscopy    . Upper gi endoscopy    . Inguinal hernia repair Left 02/05/2013    Procedure: HERNIA REPAIR INGUINAL ADULT;  Surgeon: Joyice Faster. Cornett, MD;  Location: [facility];  Service: General;  Laterality: Left;  . Insertion of mesh Left 02/05/2013    Procedure: INSERTION OF MESH;  Surgeon: Joyice Faster. Cornett, MD;  Location: [facility];  Service: General;  Laterality: Left;  . Testiclar cyst excision Left   . Vasectomy    . Shoulder arthroscopy with rotator cuff repair and subacromial decompression Left 05/01/2014    Procedure: LEFT ARTHROSCOPY SHOULDER SUBACROMIAL DECOMPRESSION,DISTAL CLAVICAL RESECTION AND ROTATOR CUFF REPAIR;  Surgeon: Metta Clines  Supple, MD;  Location: [facility];  Service: Orthopedics;  Laterality: Left;  . Inguinal hernia repair Left   . Refractive surgery      2016-2017     Current Outpatient Prescriptions  Medication Sig Dispense Refill  . amLODipine (NORVASC) 10 MG tablet Take 1 tablet by mouth  daily 90 tablet 3  . atorvastatin (LIPITOR) 20 MG tablet Take 1 tablet by mouth  daily 90 tablet 3  . bisoprolol (ZEBETA) 10 MG tablet Take 1 tablet (10 mg total) by mouth daily. 90 tablet 3  . Cholecalciferol (VITAMIN D-3) 1000 UNITS CAPS Take 1 capsule by mouth daily.    . clopidogrel (PLAVIX) 75 MG tablet Take 1 tablet by mouth  daily 90 tablet 3  . diazepam (VALIUM) 5 MG tablet Take 0.5-1 tablets (2.5-5 mg total) by mouth every 6 (six) hours as needed for muscle spasms or sedation. 40 tablet 2  . fenofibrate  160 MG tablet Take 1 tablet (160 mg total) by mouth daily. 90 tablet 3  . furosemide (LASIX) 20 MG tablet Take 1 tablet (20 mg total) by mouth every other day. 90 tablet 3  . gabapentin (NEURONTIN) 300 MG capsule Take 300 mg by mouth 2 (two) times daily.    . Melatonin 3 MG TABS Take 3 mg by mouth at bedtime.     . methocarbamol (ROBAXIN) 500 MG tablet Take 500 mg by mouth 2 (two) times daily.    Marland Kitchen oxyCODONE-acetaminophen (PERCOCET/ROXICET) 5-325 MG tablet Take 1 tablet by mouth 2 (two) times daily.    . tamsulosin (FLOMAX) 0.4 MG CAPS capsule Take 1 capsule by mouth  daily 90 capsule 3  . tiZANidine (ZANAFLEX) 4 MG tablet Take 1 tablet by mouth  every 6 hours as needed for muscle spasms 90 tablet 1  . vitamin B-12 (CYANOCOBALAMIN) 1000 MCG tablet Take 1,000 mcg by mouth daily.     No current facility-administered medications for this visit.    Allergies:   Review of patient's allergies indicates no known allergies.   Social History:  The patient  reports that he quit smoking about 26 years ago. His smoking use included Cigarettes. He has a 56 pack-year smoking history. He has never used smokeless tobacco. He reports that he drinks alcohol. He reports that he uses illicit drugs (Marijuana).   Family History:  The patient's family history includes Cancer in his father and mother; Colon cancer in his mother; Dementia in his father; Parkinsonism in his father.    ROS:  Please see the history of present illness.   Otherwise, review of systems is positive for weight changes, fatigue, leg swelling, leg pain, hearing loss, cough, dyspnea on exertion, back and muscle pain, joint swelling, easy bruising.   All other systems are reviewed and negative.    PHYSICAL EXAM: VS:  BP 142/86 mmHg  Pulse 55  Ht 5\' 9"  (1.753 m)  Wt 203 lb 6.4 oz (92.262 kg)  BMI 30.02 kg/m2 , BMI Body mass index is 30.02 kg/(m^2). GEN: Well nourished, well developed, in no acute distress HEENT: normal Neck: no JVD,  carotid bruits, or masses Cardiac: RRR; no murmurs, rubs, or gallops,no edema  Respiratory:  clear to auscultation bilaterally, normal work of breathing GI: soft, nontender, nondistended, + BS MS: no deformity or atrophy Skin: warm and dry Neuro:  Strength and sensation are intact Psych: euthymic mood, full affect  EKG:  EKG is ordered today. The ekg ordered today shows sinus bradycardia, rate 55   Recent Labs:  08/31/2015: ALT 15; BUN 21; Creatinine, Ser 1.89*; Hemoglobin 12.2*; Platelets 203.0; Potassium 4.7; Sodium 138; TSH 2.04    Lipid Panel     Component Value Date/Time   CHOL 146 11/10/2014 0955   TRIG 195.0* 11/10/2014 0955   HDL 28.50* 11/10/2014 0955   CHOLHDL 5 11/10/2014 0955   VLDL 39.0 11/10/2014 0955   LDLCALC 79 11/10/2014 0955   LDLDIRECT 134.9 12/26/2012 1201     Wt Readings from Last 3 Encounters:  09/22/15 203 lb 6.4 oz (92.262 kg)  09/17/15 202 lb 4.8 oz (91.763 kg)  08/31/15 200 lb (90.719 kg)      Other studies Reviewed: Additional studies/ records that were reviewed today include: TTE 05/2015  Review of the above records today demonstrates:  - Normal LV size with mild LV hypertrophy. EF 55-60%. Normal RV size and systolic function. Aortic sclerosis without significant stenosis.   ASSESSMENT AND PLAN:  1.  Right carotid stenosis: planned CEA. He does have a significant burden of vascular disease, and he gets short of breath with exertion. Due to that, we'll order a stress test to better risk stratify him surgically. If his stress test is low risk, he would be at intermediate risk for a high risk procedure. Would recommend continuing his Lipitor and bisoprolol through the surgical period.  2. Hypertension:: on norvasc, bisoprolol, lasix.  Would continue beta blocker through surgical period.Marland Kitchen He is having increasing lower extremity edema. He has type I diastolic dysfunction. I have told him to take his Lasix for the next 3 days to see if this improves  his symptoms.   3. Hyperlipidemia: currently on lipitor.  Would continue through surgical period.    Current medicines are reviewed at length with the patient today.   The patient does not have concerns regarding his medicines.  The following changes were made today:  none  Labs/ tests ordered today include:  No orders of the defined types were placed in this encounter.     Disposition:   FU with Tanor Glaspy 6 months  Signed, Bayli Quesinberry Meredith Leeds, MD  09/22/2015 9:36 AM     [facility] [address] [place] [address] [phone] (office) [phone] (fax)

## 2015-09-22 ENCOUNTER — Encounter: Payer: Self-pay | Admitting: Cardiology

## 2015-09-22 ENCOUNTER — Ambulatory Visit (INDEPENDENT_AMBULATORY_CARE_PROVIDER_SITE_OTHER): Payer: Medicare Other | Admitting: Cardiology

## 2015-09-22 VITALS — BP 142/86 | HR 55 | Ht 69.0 in | Wt 203.4 lb

## 2015-09-22 DIAGNOSIS — R0609 Other forms of dyspnea: Secondary | ICD-10-CM

## 2015-09-22 DIAGNOSIS — I10 Essential (primary) hypertension: Secondary | ICD-10-CM | POA: Diagnosis not present

## 2015-09-22 NOTE — Patient Instructions (Addendum)
Medication Instructions:  Your physician recommends that you continue on your current medications as directed. Please refer to the Current Medication list given to you today.  Labwork: None ordered  Testing/Procedures: Your physician has requested that you have a lexiscan myoview. For further information please visit [email or web address]. Please follow instruction sheet, as given. We will send results through MyChart  Follow-Up: Your physician wants you to follow-up in: 6 months with Dr. Curt Bears. You will receive a reminder letter in the mail two months in advance. If you don't receive a letter, please call our office to schedule the follow-up appointment.  If you need a refill on your cardiac medications before your next appointment, please call your pharmacy.  Thank you for choosing [facility]!!   Trinidad Curet, RN [phone]

## 2015-09-23 DIAGNOSIS — M179 Osteoarthritis of knee, unspecified: Secondary | ICD-10-CM | POA: Diagnosis not present

## 2015-09-23 DIAGNOSIS — M25569 Pain in unspecified knee: Secondary | ICD-10-CM | POA: Diagnosis not present

## 2015-09-24 ENCOUNTER — Telehealth (HOSPITAL_COMMUNITY): Payer: Self-pay | Admitting: *Deleted

## 2015-09-24 NOTE — Telephone Encounter (Signed)
Patient given detailed instructions per Myocardial Perfusion Study Information Sheet for the test on 09/29/15. Patient notified to arrive 15 minutes early and that it is imperative to arrive on time for appointment to keep from having the test rescheduled.  If you need to cancel or reschedule your appointment, please call the office within 24 hours of your appointment. Failure to do so may result in a cancellation of your appointment, and a $50 no show fee. Patient verbalized understanding. Hubbard Robinson, RN

## 2015-09-28 ENCOUNTER — Ambulatory Visit: Payer: Medicare Other | Admitting: Cardiology

## 2015-09-28 DIAGNOSIS — M25569 Pain in unspecified knee: Secondary | ICD-10-CM | POA: Diagnosis not present

## 2015-09-28 DIAGNOSIS — G894 Chronic pain syndrome: Secondary | ICD-10-CM | POA: Diagnosis not present

## 2015-09-28 DIAGNOSIS — Z79899 Other long term (current) drug therapy: Secondary | ICD-10-CM | POA: Diagnosis not present

## 2015-09-28 DIAGNOSIS — M47817 Spondylosis without myelopathy or radiculopathy, lumbosacral region: Secondary | ICD-10-CM | POA: Diagnosis not present

## 2015-09-28 DIAGNOSIS — Z79891 Long term (current) use of opiate analgesic: Secondary | ICD-10-CM | POA: Diagnosis not present

## 2015-09-28 DIAGNOSIS — M5137 Other intervertebral disc degeneration, lumbosacral region: Secondary | ICD-10-CM | POA: Diagnosis not present

## 2015-09-29 ENCOUNTER — Ambulatory Visit (HOSPITAL_COMMUNITY): Payer: Medicare Other | Attending: Internal Medicine

## 2015-09-29 DIAGNOSIS — R9439 Abnormal result of other cardiovascular function study: Secondary | ICD-10-CM | POA: Insufficient documentation

## 2015-09-29 DIAGNOSIS — R0609 Other forms of dyspnea: Secondary | ICD-10-CM | POA: Insufficient documentation

## 2015-09-29 DIAGNOSIS — I10 Essential (primary) hypertension: Secondary | ICD-10-CM | POA: Diagnosis not present

## 2015-09-29 LAB — MYOCARDIAL PERFUSION IMAGING
CHL CUP NUCLEAR SRS: 5
CHL CUP RESTING HR STRESS: 47 {beats}/min
CSEPPHR: 58 {beats}/min
LV dias vol: 98 mL (ref 62–150)
LVSYSVOL: 45 mL
NUC STRESS TID: 0.9
RATE: 0.25
SDS: 3
SSS: 8

## 2015-09-29 MED ORDER — REGADENOSON 0.4 MG/5ML IV SOLN
0.4000 mg | Freq: Once | INTRAVENOUS | Status: AC
  Administered 2015-09-29: 0.4 mg via INTRAVENOUS

## 2015-09-29 MED ORDER — TECHNETIUM TC 99M SESTAMIBI GENERIC - CARDIOLITE
32.3000 | Freq: Once | INTRAVENOUS | Status: AC | PRN
  Administered 2015-09-29: 32.3 via INTRAVENOUS

## 2015-09-29 MED ORDER — TECHNETIUM TC 99M SESTAMIBI GENERIC - CARDIOLITE
11.0000 | Freq: Once | INTRAVENOUS | Status: AC | PRN
  Administered 2015-09-29: 11 via INTRAVENOUS

## 2015-10-01 NOTE — Pre-Procedure Instructions (Signed)
Kevin [name]  10/01/2015      [facility] [place], [place] - [address] [place] [address] Phone: [phone] Fax: [place], [place] [address] [place] [address] [place] [address] Phone: [phone] Fax: [phone]    Your procedure is scheduled on Mon, May 15 @ 7:30 AM  Report to [facility] at 5:30 AM  Call this number if you have problems the morning of surgery:  [phone]   Remember:  Do not eat food or drink liquids after midnight.  Take these medicines the morning of surgery with A SIP OF WATER Amlodipine(Norvasc),Bisoprolol(Zebeta),Valium(Diazepam),Gabapentin(Neurontin),Pain Pill(if needed),and Tamsulosin(Flomax)              No Goody's,BC's,Aleve,Advil,Motrin,Ibuprofen,Fish Oil,or any Herbal Medications.    Do not wear jewelry.  Do not wear lotions, powders, or colognes.               Men may shave face and neck.  Do not bring valuables to the hospital.  [facility] is not responsible for any belongings or valuables.  Contacts, dentures or bridgework may not be worn into surgery.  Leave your suitcase in the car.  After surgery it may be brought to your room.  For patients admitted to the hospital, discharge time will be determined by your treatment team.  Patients discharged the day of surgery will not be allowed to drive home.    Special instructions:  [facility] - Preparing for Surgery  Before surgery, you can play an important role.  Because skin is not sterile, your skin needs to be as free of germs as possible.  You can reduce the number of germs on you skin by washing with CHG (chlorahexidine gluconate) soap before surgery.  CHG is an antiseptic cleaner which kills germs and bonds with the skin to continue killing germs even after washing.  Please DO NOT use if you have an allergy to CHG or antibacterial soaps.  If  your skin becomes reddened/irritated stop using the CHG and inform your nurse when you arrive at Short Stay.  Do not shave (including legs and underarms) for at least 48 hours prior to the first CHG shower.  You may shave your face.  Please follow these instructions carefully:   1.  Shower with CHG Soap the night before surgery and the                                morning of Surgery.  2.  If you choose to wash your hair, wash your hair first as usual with your       normal shampoo.  3.  After you shampoo, rinse your hair and body thoroughly to remove the                      Shampoo.  4.  Use CHG as you would any other liquid soap.  You can apply chg directly       to the skin and wash gently with scrungie or a clean washcloth.  5.  Apply the CHG Soap to your body ONLY FROM THE NECK DOWN.        Do not use on open wounds or open sores.  Avoid contact with your eyes,       ears, mouth and genitals (private parts).  Wash genitals (private parts)       with your normal soap.  6.  Wash thoroughly, paying special attention to the area where your surgery        will be performed.  7.  Thoroughly rinse your body with warm water from the neck down.  8.  DO NOT shower/wash with your normal soap after using and rinsing off       the CHG Soap.  9.  Pat yourself dry with a clean towel.            10.  Wear clean pajamas.            11.  Place clean sheets on your bed the night of your first shower and do not        sleep with pets.  Day of Surgery  Do not apply any lotions/deoderants the morning of surgery.  Please wear clean clothes to the hospital/surgery center.    Please read over the following fact sheets that you were given. Pain Booklet, Coughing and Deep Breathing, Blood Transfusion Information, MRSA Information and Surgical Site Infection Prevention

## 2015-10-02 ENCOUNTER — Encounter (HOSPITAL_COMMUNITY)
Admission: RE | Admit: 2015-10-02 | Discharge: 2015-10-02 | Disposition: A | Discharge status: Home / Self Care | Payer: Medicare Other | Source: Ambulatory Visit | Attending: Vascular Surgery | Admitting: Vascular Surgery

## 2015-10-02 ENCOUNTER — Encounter (HOSPITAL_COMMUNITY): Payer: Self-pay

## 2015-10-02 DIAGNOSIS — I6521 Occlusion and stenosis of right carotid artery: Secondary | ICD-10-CM | POA: Insufficient documentation

## 2015-10-02 DIAGNOSIS — Z87891 Personal history of nicotine dependence: Secondary | ICD-10-CM | POA: Diagnosis not present

## 2015-10-02 DIAGNOSIS — Z7902 Long term (current) use of antithrombotics/antiplatelets: Secondary | ICD-10-CM | POA: Insufficient documentation

## 2015-10-02 DIAGNOSIS — I129 Hypertensive chronic kidney disease with stage 1 through stage 4 chronic kidney disease, or unspecified chronic kidney disease: Secondary | ICD-10-CM | POA: Diagnosis not present

## 2015-10-02 DIAGNOSIS — Z79899 Other long term (current) drug therapy: Secondary | ICD-10-CM | POA: Insufficient documentation

## 2015-10-02 DIAGNOSIS — Z01818 Encounter for other preprocedural examination: Secondary | ICD-10-CM | POA: Diagnosis not present

## 2015-10-02 DIAGNOSIS — Z95828 Presence of other vascular implants and grafts: Secondary | ICD-10-CM | POA: Insufficient documentation

## 2015-10-02 DIAGNOSIS — Z01812 Encounter for preprocedural laboratory examination: Secondary | ICD-10-CM | POA: Insufficient documentation

## 2015-10-02 DIAGNOSIS — Z8673 Personal history of transient ischemic attack (TIA), and cerebral infarction without residual deficits: Secondary | ICD-10-CM | POA: Insufficient documentation

## 2015-10-02 DIAGNOSIS — E785 Hyperlipidemia, unspecified: Secondary | ICD-10-CM | POA: Diagnosis not present

## 2015-10-02 DIAGNOSIS — Z0183 Encounter for blood typing: Secondary | ICD-10-CM | POA: Insufficient documentation

## 2015-10-02 DIAGNOSIS — N183 Chronic kidney disease, stage 3 (moderate): Secondary | ICD-10-CM | POA: Insufficient documentation

## 2015-10-02 HISTORY — DX: Reserved for inherently not codable concepts without codable children: IMO0001

## 2015-10-02 HISTORY — DX: Localized edema: R60.0

## 2015-10-02 HISTORY — DX: Other amnesia: R41.3

## 2015-10-02 HISTORY — DX: Benign prostatic hyperplasia without lower urinary tract symptoms: N40.0

## 2015-10-02 HISTORY — DX: Edema, unspecified: R60.9

## 2015-10-02 HISTORY — DX: Chronic kidney disease, unspecified: N18.9

## 2015-10-02 LAB — COMPREHENSIVE METABOLIC PANEL
ALBUMIN: 4 g/dL (ref 3.5–5.0)
ALK PHOS: 31 U/L — AB (ref 38–126)
ALT: 18 U/L (ref 17–63)
AST: 21 U/L (ref 15–41)
Anion gap: 8 (ref 5–15)
BUN: 33 mg/dL — AB (ref 6–20)
CALCIUM: 9 mg/dL (ref 8.9–10.3)
CHLORIDE: 103 mmol/L (ref 101–111)
CO2: 25 mmol/L (ref 22–32)
CREATININE: 2.3 mg/dL — AB (ref 0.61–1.24)
GFR, EST AFRICAN AMERICAN: 30 mL/min — AB (ref >60)
GFR, EST NON AFRICAN AMERICAN: 26 mL/min — AB (ref >60)
Glucose, Bld: 118 mg/dL — ABNORMAL HIGH (ref 65–99)
Potassium: 4.4 mmol/L (ref 3.5–5.1)
SODIUM: 136 mmol/L (ref 135–145)
Total Bilirubin: 0.5 mg/dL (ref 0.3–1.2)
Total Protein: 7 g/dL (ref 6.5–8.1)

## 2015-10-02 LAB — CBC
HCT: 39 % (ref 39.0–52.0)
HEMOGLOBIN: 12.9 g/dL — AB (ref 13.0–17.0)
MCH: 29.8 pg (ref 26.0–34.0)
MCHC: 33.1 g/dL (ref 30.0–36.0)
MCV: 90.1 fL (ref 78.0–100.0)
Platelets: 196 10*3/uL (ref 150–400)
RBC: 4.33 MIL/uL (ref 4.22–5.81)
RDW: 13.8 % (ref 11.5–15.5)
WBC: 9.7 10*3/uL (ref 4.0–10.5)

## 2015-10-02 LAB — PROTIME-INR
INR: 1.12 (ref 0.00–1.49)
Prothrombin Time: 14.6 seconds (ref 11.6–15.2)

## 2015-10-02 LAB — TYPE AND SCREEN
ABO/RH(D): AB POS
Antibody Screen: NEGATIVE

## 2015-10-02 LAB — URINALYSIS, ROUTINE W REFLEX MICROSCOPIC
Bilirubin Urine: NEGATIVE
Glucose, UA: NEGATIVE mg/dL
Hgb urine dipstick: NEGATIVE
KETONES UR: NEGATIVE mg/dL
LEUKOCYTES UA: NEGATIVE
NITRITE: NEGATIVE
PROTEIN: NEGATIVE mg/dL
Specific Gravity, Urine: 1.012 (ref 1.005–1.030)
pH: 5 (ref 5.0–8.0)

## 2015-10-02 LAB — SURGICAL PCR SCREEN
MRSA, PCR: NEGATIVE
STAPHYLOCOCCUS AUREUS: NEGATIVE

## 2015-10-02 LAB — APTT: APTT: 33 s (ref 24–37)

## 2015-10-02 MED ORDER — CHLORHEXIDINE GLUCONATE CLOTH 2 % EX PADS: 6.0000 | MEDICATED_PAD | Freq: Once | CUTANEOUS | Status: DC

## 2015-10-02 NOTE — Progress Notes (Addendum)
Cardiologist is Dr.Camnitz-last visit 09-21-15  Medical Md is Dr.[name]  Echo reports in epic from 2013  Stress test report in epic from 09-29-15  Heart cath denies   EKG in epic from 09-22-15  CXR in epic from 04-10-15

## 2015-10-05 ENCOUNTER — Encounter (HOSPITAL_COMMUNITY): Payer: Self-pay

## 2015-10-05 NOTE — Progress Notes (Addendum)
Anesthesia Chart Review: Patient is a 75 year old male scheduled for right CEA on 10/12/15 by Dr. Oneida Alar.  History includes former smoker (quit '89), HLD, insomnia, TIA '13, RA, bradycardia, rheumatic fever '46, HTN, RLL CAP 02/2015, exertional dyspnea, "minimal" short term memory loss, peripheral edema, BPH, CKD stage III, AAA repair (38mm Dacron graft) 04/07/09, left carotid endarterectomy '06, tonsillectomy, left inguinal hernia repair '14, left rotator cuff repair 05/01/14.   PCP is Dr. Garret Reddish, last visit 08/31/15. Lasix 20 mg started every other day for BLE edema with recommendation for return in 10 days to repeat BMET (next BMET not seen until 10/02/15).   Nephrologist is Dr. Corliss Parish (referred 10/2014 when Cr 2.47), last visit 07/27/14 (scanned under Media tab). Next visit is scheduled for 11/24/15. At that time, most recent Cr 1.7-1.9 with GFR > 40.   Cardiologist is Dr. Curt Bears who saw patient for pre-operative cardiology evaluation on 09/22/15.  Meds include amlodipine, Lipitor, bisoprolol, Plavix, Valium, fenofibrate, Lasix, Neurontin, melatonin, Percocet, Flomax, Zanaflex, vitamin B-12. Last pre-op dose Plavix 10/06/15 with instructions to start ASA 81 mg on 10/07/15 (per [facility]). Patient reported that since he saw Dr. Curt Bears, his understanding was that his Lasix should be taken daily (on 09/21/15, Dr. Curt Bears, specifically mentions taking for three days to see if it helps with his LE edema.)  PAT Vitals: BP 136/60, HR 119, RR 20, T 36.7C, O2 sat 96%. Unfortunately, HR was not rechecked at PAT. Anesthesia was not consulted to evaluate him during his pre-operative visit. Historically, it looks like his HR runs in the 50's, so unclear if this was a true or erroneous result. I called and spoke with patient briefly (he had intermittent cellular service and was on the road to [place]). It sounded like he didn't remember being told his heart was fast and says it was not rechecked. He will not be  back from [place] until 10/08/15. I told him I would like his heart rate checked prior to his upcoming surgery.    09/29/15 Nuclear stress test:  Nuclear stress EF: 54%.  There was no ST segment deviation noted during stress.  No T wave inversion was noted during stress.  Defect 1: There is a small defect of mild severity present in the mid inferolateral and apical lateral location.  Findings consistent with mild ischemia.  This is a low risk study.  The left ventricular ejection fraction is mildly decreased (45-54%). Dr. Curt Bears reviewed and wrote, "Low risk study, plan for surgery with continued use of beta blocker, ASA and statin during surgical period."  06/30/15 Echo: Impressions: - Normal LV size with mild LV hypertrophy. EF 55-60%. Grade 1 diastolic dysfunction. Normal RV size and systolic function. Aortic sclerosis without significant stenosis.  09/17/15 Carotid U/S: 1. 80-99% RICA stenosis 2. Patent left CEA with no evidence for restenosis 3. Multiphasic subclavian arteries  04/10/15 CXR: FINDINGS: Mediastinum hilar structures normal. Interim clearing of right lower lobe infiltrate. Calcified pulmonary nodules consistent granulomas. No pleural effusion pneumothorax. Heart size stable. Degenerative changes thoracic spine. IMPRESSION: Interim clearing of right lower lobe infiltrate.  Preoperative labs noted. BUN 33, Cr 2.30, up from 22/1.89 on 08/31/15 (previous trends Cr 1.77 - 2.47 since 05/15/13). Glucose 118. H/H 12.9/39.0. PT/PTT, UA WNL. A1c on 05/21/15 was 6.0.  Cr called to [facility] RN, and CMET results routed to Dr. [name] for any pre-operative recommendations.   Update: Dr. [name] has reviewed CMET results. He was contemplating decreasing Lasix to every three days and  seeing if labs could be repeated prior to surgery. I did forward him a message indicating that patient was most recently taking Lasix 20 mg daily and that I had encouraged patient to follow-up with him  regarding specific recommendations. I also let him know that our staff had recorded a heart rate of 119bpm at PAT. I had asked that if patient is seen there for a visit or labs to please re-evaluate HR, otherwise let me know and I would see if labs and vitals could be done here at PAT before Monday. I have updated Colletta Maryland at [facility].   George [facility] Short Stay Center/Anesthesiology Phone [phone] 10/05/2015 4:45 PM  Addendum: Patient just got back from [place], [place] within the hour. He says he that he is no longer taking Lasix daily, but every other day. He feels BLE edema (R > L) has improved a lot on Lasix and compression stockings. He has not had any palpitations or feeling like his heart was racing. He does have a BP wrist monitor at home and while on the phone with me his heart rate was 57 bpm and BP was 137/72. He thinks the HR of 119 was erroneous. He is going to call Dr. [name] now to see if he still is recommending a repeat BMET. I did give option for patient to come to PAT tomorrow, but he will check with Dr. [name] first.  Myra Gianotti, PA-C [facility] Short Stay Center/Anesthesiology Phone [phone] 10/08/2015 3:23 PM  Addendum: BMET done at Dr. Ansel Bong office this morning. Cr improved from 2.30 to 1.86 (which is consistent with the results mentioned at his last nephrology office visit.). Since his renal function appears back to baseline, I think results would be acceptable from an anesthesia standpoint. Unless otherwise specified by Dr. [name], I would anticipate that he could proceed as planned if no acute changes upon evaluation by his surgeon and anesthesiologist on the day of surgery.   [facility] Short Stay Center/Anesthesiology Phone [phone] 10/09/2015 2:03 PM

## 2015-10-08 ENCOUNTER — Telehealth: Payer: Self-pay | Admitting: Family Medicine

## 2015-10-08 NOTE — Telephone Encounter (Signed)
Please at minimum have him come by for a bmet tomorrow- this is important as his kidney function had worsened on last check by anesthesia. Glad he is taking lasix every other day now per their notes

## 2015-10-08 NOTE — Telephone Encounter (Signed)
Pt would not tell me just wanted to talk with you or Dr. [name]

## 2015-10-08 NOTE — Telephone Encounter (Signed)
Pt would not elaborate on the matter.

## 2015-10-08 NOTE — Telephone Encounter (Signed)
What is patient wanting?

## 2015-10-08 NOTE — Telephone Encounter (Signed)
Pt called stating there is nothing wrong with him and there is no reason for him to see you before having his surgery on Monday, he states he got an ok and clear from stress test he would like for you to call Ebony Hail at [phone] to discuss in further detail. Pt states there is nothing that is going to stop him from having this surgery on Monday.

## 2015-10-09 ENCOUNTER — Other Ambulatory Visit: Payer: Self-pay | Admitting: Family Medicine

## 2015-10-09 ENCOUNTER — Other Ambulatory Visit (INDEPENDENT_AMBULATORY_CARE_PROVIDER_SITE_OTHER): Payer: Medicare Other

## 2015-10-09 DIAGNOSIS — N183 Chronic kidney disease, stage 3 unspecified: Secondary | ICD-10-CM

## 2015-10-09 LAB — BASIC METABOLIC PANEL
BUN: 20 mg/dL (ref 6–23)
CALCIUM: 8.6 mg/dL (ref 8.4–10.5)
CO2: 26 meq/L (ref 19–32)
CREATININE: 1.86 mg/dL — AB (ref 0.40–1.50)
Chloride: 107 mEq/L (ref 96–112)
GFR: 37.65 mL/min — AB (ref >60.00)
GLUCOSE: 105 mg/dL — AB (ref 70–99)
Potassium: 4.4 mEq/L (ref 3.5–5.1)
Sodium: 140 mEq/L (ref 135–145)

## 2015-10-09 NOTE — Telephone Encounter (Signed)
BMET ordered, lab scheduled and pt notified.

## 2015-10-11 MED ORDER — SODIUM CHLORIDE 0.9 % IV SOLN: INTRAVENOUS | Status: DC

## 2015-10-11 MED ORDER — DEXTROSE 5 % IV SOLN
1.5000 g | INTRAVENOUS | Status: AC
  Administered 2015-10-12: 1.5 g via INTRAVENOUS
  Filled 2015-10-11: qty 1.5

## 2015-10-12 ENCOUNTER — Inpatient Hospital Stay (HOSPITAL_COMMUNITY): Payer: Medicare Other | Admitting: Vascular Surgery

## 2015-10-12 ENCOUNTER — Encounter (HOSPITAL_COMMUNITY): Payer: Self-pay | Admitting: *Deleted

## 2015-10-12 ENCOUNTER — Inpatient Hospital Stay (HOSPITAL_COMMUNITY): Payer: Medicare Other | Admitting: Anesthesiology

## 2015-10-12 ENCOUNTER — Encounter (HOSPITAL_COMMUNITY)
Admission: RE | Disposition: A | Discharge status: Home / Self Care | Payer: Self-pay | Source: Ambulatory Visit | Attending: Vascular Surgery

## 2015-10-12 ENCOUNTER — Inpatient Hospital Stay (HOSPITAL_COMMUNITY)
Admission: RE | Admit: 2015-10-12 | Discharge: 2015-10-13 | DRG: 039 | Disposition: A | Discharge status: Home / Self Care | Payer: Medicare Other | Source: Ambulatory Visit | Attending: Vascular Surgery | Admitting: Vascular Surgery

## 2015-10-12 DIAGNOSIS — Z7902 Long term (current) use of antithrombotics/antiplatelets: Secondary | ICD-10-CM

## 2015-10-12 DIAGNOSIS — I129 Hypertensive chronic kidney disease with stage 1 through stage 4 chronic kidney disease, or unspecified chronic kidney disease: Secondary | ICD-10-CM | POA: Diagnosis present

## 2015-10-12 DIAGNOSIS — I6529 Occlusion and stenosis of unspecified carotid artery: Secondary | ICD-10-CM | POA: Diagnosis present

## 2015-10-12 DIAGNOSIS — Z87891 Personal history of nicotine dependence: Secondary | ICD-10-CM | POA: Diagnosis not present

## 2015-10-12 DIAGNOSIS — N183 Chronic kidney disease, stage 3 (moderate): Secondary | ICD-10-CM | POA: Diagnosis present

## 2015-10-12 DIAGNOSIS — Z8673 Personal history of transient ischemic attack (TIA), and cerebral infarction without residual deficits: Secondary | ICD-10-CM | POA: Diagnosis not present

## 2015-10-12 DIAGNOSIS — N189 Chronic kidney disease, unspecified: Secondary | ICD-10-CM | POA: Diagnosis not present

## 2015-10-12 DIAGNOSIS — E785 Hyperlipidemia, unspecified: Secondary | ICD-10-CM | POA: Diagnosis present

## 2015-10-12 DIAGNOSIS — Z79899 Other long term (current) drug therapy: Secondary | ICD-10-CM | POA: Diagnosis not present

## 2015-10-12 DIAGNOSIS — M1990 Unspecified osteoarthritis, unspecified site: Secondary | ICD-10-CM | POA: Diagnosis not present

## 2015-10-12 DIAGNOSIS — M069 Rheumatoid arthritis, unspecified: Secondary | ICD-10-CM | POA: Diagnosis present

## 2015-10-12 DIAGNOSIS — I6521 Occlusion and stenosis of right carotid artery: Secondary | ICD-10-CM | POA: Diagnosis not present

## 2015-10-12 DIAGNOSIS — N400 Enlarged prostate without lower urinary tract symptoms: Secondary | ICD-10-CM | POA: Diagnosis not present

## 2015-10-12 HISTORY — PX: ENDARTERECTOMY: SHX5162

## 2015-10-12 LAB — CBC
HEMATOCRIT: 32.6 % — AB (ref 39.0–52.0)
Hemoglobin: 11.2 g/dL — ABNORMAL LOW (ref 13.0–17.0)
MCH: 30.6 pg (ref 26.0–34.0)
MCHC: 34.4 g/dL (ref 30.0–36.0)
MCV: 89.1 fL (ref 78.0–100.0)
Platelets: 164 10*3/uL (ref 150–400)
RBC: 3.66 MIL/uL — ABNORMAL LOW (ref 4.22–5.81)
RDW: 13.9 % (ref 11.5–15.5)
WBC: 10.3 10*3/uL (ref 4.0–10.5)

## 2015-10-12 LAB — CREATININE, SERUM
Creatinine, Ser: 1.87 mg/dL — ABNORMAL HIGH (ref 0.61–1.24)
GFR calc non Af Amer: 33 mL/min — ABNORMAL LOW (ref >60)
GFR, EST AFRICAN AMERICAN: 39 mL/min — AB (ref >60)

## 2015-10-12 SURGERY — ENDARTERECTOMY, CAROTID
Anesthesia: General | Site: Neck | Laterality: Right

## 2015-10-12 MED ORDER — LABETALOL HCL 5 MG/ML IV SOLN: 10.0000 mg | INTRAVENOUS | Status: DC | PRN

## 2015-10-12 MED ORDER — ROCURONIUM BROMIDE 50 MG/5ML IV SOLN
INTRAVENOUS | Status: AC
  Filled 2015-10-12: qty 1

## 2015-10-12 MED ORDER — CLOPIDOGREL BISULFATE 75 MG PO TABS
75.0000 mg | ORAL_TABLET | Freq: Every day | ORAL | Status: DC
  Administered 2015-10-13: 75 mg via ORAL
  Filled 2015-10-12: qty 1

## 2015-10-12 MED ORDER — SUGAMMADEX SODIUM 200 MG/2ML IV SOLN
INTRAVENOUS | Status: DC | PRN
  Administered 2015-10-12: 200 mg via INTRAVENOUS

## 2015-10-12 MED ORDER — TIZANIDINE HCL 2 MG PO TABS: 4.0000 mg | ORAL_TABLET | Freq: Four times a day (QID) | ORAL | Status: DC | PRN

## 2015-10-12 MED ORDER — ACETAMINOPHEN 325 MG RE SUPP: 325.0000 mg | RECTAL | Status: DC | PRN

## 2015-10-12 MED ORDER — ONDANSETRON HCL 4 MG/2ML IJ SOLN: 4.0000 mg | Freq: Four times a day (QID) | INTRAMUSCULAR | Status: DC | PRN

## 2015-10-12 MED ORDER — MORPHINE SULFATE (PF) 2 MG/ML IV SOLN
2.0000 mg | INTRAVENOUS | Status: DC | PRN
  Administered 2015-10-12: 2 mg via INTRAVENOUS
  Filled 2015-10-12: qty 1

## 2015-10-12 MED ORDER — REMIFENTANIL HCL 2 MG IV SOLR
INTRAVENOUS | Status: DC | PRN
  Administered 2015-10-12: .2 ug/kg/min via INTRAVENOUS

## 2015-10-12 MED ORDER — LIDOCAINE HCL (PF) 1 % IJ SOLN
INTRAMUSCULAR | Status: AC
  Filled 2015-10-12: qty 30

## 2015-10-12 MED ORDER — PROTAMINE SULFATE 10 MG/ML IV SOLN
INTRAVENOUS | Status: DC | PRN
  Administered 2015-10-12: 10 mg via INTRAVENOUS
  Administered 2015-10-12: 20 mg via INTRAVENOUS
  Administered 2015-10-12: 10 mg via INTRAVENOUS
  Administered 2015-10-12: 20 mg via INTRAVENOUS
  Administered 2015-10-12: 10 mg via INTRAVENOUS
  Administered 2015-10-12: 20 mg via INTRAVENOUS

## 2015-10-12 MED ORDER — HYDRALAZINE HCL 20 MG/ML IJ SOLN: 5.0000 mg | INTRAMUSCULAR | Status: DC | PRN

## 2015-10-12 MED ORDER — BISACODYL 5 MG PO TBEC: 5.0000 mg | DELAYED_RELEASE_TABLET | Freq: Every day | ORAL | Status: DC | PRN

## 2015-10-12 MED ORDER — ATORVASTATIN CALCIUM 20 MG PO TABS
20.0000 mg | ORAL_TABLET | Freq: Every day | ORAL | Status: DC
  Administered 2015-10-12: 20 mg via ORAL
  Filled 2015-10-12: qty 1

## 2015-10-12 MED ORDER — FENTANYL CITRATE (PF) 100 MCG/2ML IJ SOLN
INTRAMUSCULAR | Status: DC | PRN
  Administered 2015-10-12: 50 ug via INTRAVENOUS

## 2015-10-12 MED ORDER — FUROSEMIDE 20 MG PO TABS
20.0000 mg | ORAL_TABLET | ORAL | Status: DC
  Administered 2015-10-12: 20 mg via ORAL
  Filled 2015-10-12: qty 1

## 2015-10-12 MED ORDER — VECURONIUM BROMIDE 10 MG IV SOLR
INTRAVENOUS | Status: AC
  Filled 2015-10-12: qty 10

## 2015-10-12 MED ORDER — POTASSIUM CHLORIDE CRYS ER 20 MEQ PO TBCR: 20.0000 meq | EXTENDED_RELEASE_TABLET | Freq: Every day | ORAL | Status: DC | PRN

## 2015-10-12 MED ORDER — ALUM & MAG HYDROXIDE-SIMETH 200-200-20 MG/5ML PO SUSP
15.0000 mL | ORAL | Status: DC | PRN
Start: 2015-10-12 — End: 2015-10-13

## 2015-10-12 MED ORDER — OXYCODONE-ACETAMINOPHEN 5-325 MG PO TABS
1.0000 | ORAL_TABLET | ORAL | Status: DC | PRN
  Administered 2015-10-12: 1 via ORAL
  Administered 2015-10-12: 2 via ORAL
  Filled 2015-10-12: qty 1
  Filled 2015-10-12: qty 2

## 2015-10-12 MED ORDER — DIAZEPAM 5 MG PO TABS: 2.5000 mg | ORAL_TABLET | Freq: Four times a day (QID) | ORAL | Status: DC | PRN

## 2015-10-12 MED ORDER — LIDOCAINE HCL 4 % MT SOLN
OROMUCOSAL | Status: DC | PRN
  Administered 2015-10-12: 4 mL via TOPICAL

## 2015-10-12 MED ORDER — HEPARIN SODIUM (PORCINE) 5000 UNIT/ML IJ SOLN
INTRAMUSCULAR | Status: DC | PRN
  Administered 2015-10-12: 500 mL

## 2015-10-12 MED ORDER — PHENYLEPHRINE HCL 10 MG/ML IJ SOLN
INTRAMUSCULAR | Status: DC | PRN
  Administered 2015-10-12 (×2): 40 ug via INTRAVENOUS
  Administered 2015-10-12: 80 ug via INTRAVENOUS

## 2015-10-12 MED ORDER — PHENYLEPHRINE HCL 10 MG/ML IJ SOLN
10.0000 mg | INTRAVENOUS | Status: DC | PRN
  Administered 2015-10-12: 20 ug/min via INTRAVENOUS

## 2015-10-12 MED ORDER — GUAIFENESIN-DM 100-10 MG/5ML PO SYRP: 15.0000 mL | ORAL_SOLUTION | ORAL | Status: DC | PRN

## 2015-10-12 MED ORDER — HEMOSTATIC AGENTS (NO CHARGE) OPTIME
TOPICAL | Status: DC | PRN
  Administered 2015-10-12: 1 via TOPICAL

## 2015-10-12 MED ORDER — DOCUSATE SODIUM 100 MG PO CAPS
100.0000 mg | ORAL_CAPSULE | Freq: Every day | ORAL | Status: DC
  Administered 2015-10-13: 100 mg via ORAL
  Filled 2015-10-12: qty 1

## 2015-10-12 MED ORDER — METOPROLOL TARTRATE 5 MG/5ML IV SOLN: 2.0000 mg | INTRAVENOUS | Status: DC | PRN

## 2015-10-12 MED ORDER — PANTOPRAZOLE SODIUM 40 MG PO TBEC
40.0000 mg | DELAYED_RELEASE_TABLET | Freq: Every day | ORAL | Status: DC
  Administered 2015-10-12 – 2015-10-13 (×2): 40 mg via ORAL
  Filled 2015-10-12 (×2): qty 1

## 2015-10-12 MED ORDER — ASPIRIN EC 325 MG PO TBEC
325.0000 mg | DELAYED_RELEASE_TABLET | Freq: Every day | ORAL | Status: DC
Start: 2015-10-12 — End: 2015-10-13
  Administered 2015-10-12 – 2015-10-13 (×2): 325 mg via ORAL
  Filled 2015-10-12 (×2): qty 1

## 2015-10-12 MED ORDER — DEXTROSE 5 % IV SOLN
1.5000 g | Freq: Two times a day (BID) | INTRAVENOUS | Status: AC
  Administered 2015-10-12 – 2015-10-13 (×2): 1.5 g via INTRAVENOUS
  Filled 2015-10-12 (×2): qty 1.5

## 2015-10-12 MED ORDER — ENOXAPARIN SODIUM 40 MG/0.4ML ~~LOC~~ SOLN: 40.0000 mg | SUBCUTANEOUS | Status: DC

## 2015-10-12 MED ORDER — AMLODIPINE BESYLATE 10 MG PO TABS
10.0000 mg | ORAL_TABLET | Freq: Every day | ORAL | Status: DC
  Administered 2015-10-13: 10 mg via ORAL
  Filled 2015-10-12: qty 1

## 2015-10-12 MED ORDER — VECURONIUM BROMIDE 10 MG IV SOLR
INTRAVENOUS | Status: DC | PRN
  Administered 2015-10-12 (×2): 2 mg via INTRAVENOUS

## 2015-10-12 MED ORDER — OXYCODONE-ACETAMINOPHEN 5-325 MG PO TABS: 1.0000 | ORAL_TABLET | Freq: Three times a day (TID) | ORAL | Status: DC | PRN

## 2015-10-12 MED ORDER — LACTATED RINGERS IV SOLN
INTRAVENOUS | Status: DC | PRN
  Administered 2015-10-12 (×2): via INTRAVENOUS

## 2015-10-12 MED ORDER — PROPOFOL 10 MG/ML IV BOLUS
INTRAVENOUS | Status: DC | PRN
  Administered 2015-10-12: 80 mg via INTRAVENOUS

## 2015-10-12 MED ORDER — LIDOCAINE HCL (CARDIAC) 20 MG/ML IV SOLN
INTRAVENOUS | Status: DC | PRN
  Administered 2015-10-12: 40 mg via INTRAVENOUS

## 2015-10-12 MED ORDER — SODIUM CHLORIDE 0.9 % IV SOLN
INTRAVENOUS | Status: DC
  Administered 2015-10-12: 13:00:00 via INTRAVENOUS

## 2015-10-12 MED ORDER — SODIUM CHLORIDE 0.9 % IV SOLN
0.0125 ug/kg/min | INTRAVENOUS | Status: DC
  Filled 2015-10-12: qty 1000

## 2015-10-12 MED ORDER — HEPARIN SODIUM (PORCINE) 1000 UNIT/ML IJ SOLN
INTRAMUSCULAR | Status: AC
  Filled 2015-10-12: qty 1

## 2015-10-12 MED ORDER — TAMSULOSIN HCL 0.4 MG PO CAPS
0.4000 mg | ORAL_CAPSULE | Freq: Every day | ORAL | Status: DC
  Administered 2015-10-13: 0.4 mg via ORAL
  Filled 2015-10-12: qty 1

## 2015-10-12 MED ORDER — SODIUM CHLORIDE 0.9 % IV SOLN: 500.0000 mL | Freq: Once | INTRAVENOUS | Status: DC | PRN

## 2015-10-12 MED ORDER — HEPARIN SODIUM (PORCINE) 1000 UNIT/ML IJ SOLN
INTRAMUSCULAR | Status: DC | PRN
  Administered 2015-10-12: 9000 [IU] via INTRAVENOUS

## 2015-10-12 MED ORDER — REMIFENTANIL HCL 1 MG IV SOLR
INTRAVENOUS | Status: DC | PRN
  Administered 2015-10-12: .15 ug/kg/min via INTRAVENOUS

## 2015-10-12 MED ORDER — LIDOCAINE 2% (20 MG/ML) 5 ML SYRINGE
INTRAMUSCULAR | Status: AC
  Filled 2015-10-12: qty 5

## 2015-10-12 MED ORDER — ONDANSETRON HCL 4 MG/2ML IJ SOLN
INTRAMUSCULAR | Status: AC
  Filled 2015-10-12: qty 2

## 2015-10-12 MED ORDER — PROTAMINE SULFATE 10 MG/ML IV SOLN
INTRAVENOUS | Status: AC
  Filled 2015-10-12: qty 10

## 2015-10-12 MED ORDER — 0.9 % SODIUM CHLORIDE (POUR BTL) OPTIME
TOPICAL | Status: DC | PRN
  Administered 2015-10-12: 2000 mL

## 2015-10-12 MED ORDER — FENTANYL CITRATE (PF) 250 MCG/5ML IJ SOLN
INTRAMUSCULAR | Status: AC
  Filled 2015-10-12: qty 5

## 2015-10-12 MED ORDER — PROPOFOL 10 MG/ML IV BOLUS
INTRAVENOUS | Status: AC
  Filled 2015-10-12: qty 20

## 2015-10-12 MED ORDER — SODIUM CHLORIDE 0.9 % IV SOLN
0.0125 ug/kg/min | INTRAVENOUS | Status: DC
  Filled 2015-10-12: qty 2000

## 2015-10-12 MED ORDER — FENOFIBRATE 160 MG PO TABS
160.0000 mg | ORAL_TABLET | Freq: Every day | ORAL | Status: DC
  Administered 2015-10-12 – 2015-10-13 (×2): 160 mg via ORAL
  Filled 2015-10-12 (×2): qty 1

## 2015-10-12 MED ORDER — ACETAMINOPHEN 325 MG PO TABS: 325.0000 mg | ORAL_TABLET | ORAL | Status: DC | PRN

## 2015-10-12 MED ORDER — ONDANSETRON HCL 4 MG/2ML IJ SOLN
INTRAMUSCULAR | Status: DC | PRN
  Administered 2015-10-12: 4 mg via INTRAVENOUS

## 2015-10-12 MED ORDER — SODIUM CHLORIDE 0.9 % IJ SOLN
INTRAMUSCULAR | Status: AC
  Filled 2015-10-12: qty 10

## 2015-10-12 MED ORDER — PHENOL 1.4 % MT LIQD: 1.0000 | OROMUCOSAL | Status: DC | PRN

## 2015-10-12 MED ORDER — HYDROMORPHONE HCL 1 MG/ML IJ SOLN: 0.2500 mg | INTRAMUSCULAR | Status: DC | PRN

## 2015-10-12 MED ORDER — ROCURONIUM BROMIDE 100 MG/10ML IV SOLN
INTRAVENOUS | Status: DC | PRN
  Administered 2015-10-12: 50 mg via INTRAVENOUS

## 2015-10-12 MED ORDER — SENNOSIDES-DOCUSATE SODIUM 8.6-50 MG PO TABS: 1.0000 | ORAL_TABLET | Freq: Every evening | ORAL | Status: DC | PRN

## 2015-10-12 MED ORDER — BISOPROLOL FUMARATE 5 MG PO TABS
10.0000 mg | ORAL_TABLET | Freq: Every day | ORAL | Status: DC
  Administered 2015-10-13: 10 mg via ORAL
  Filled 2015-10-12: qty 2

## 2015-10-12 MED ORDER — MAGNESIUM SULFATE 2 GM/50ML IV SOLN: 2.0000 g | Freq: Every day | INTRAVENOUS | Status: DC | PRN

## 2015-10-12 SURGICAL SUPPLY — 52 items
CANISTER SUCTION 2500CC (MISCELLANEOUS) ×2 IMPLANT
CANNULA VESSEL 3MM 2 BLNT TIP (CANNULA) ×4 IMPLANT
CATH ROBINSON RED A/P 18FR (CATHETERS) ×2 IMPLANT
CLIP TI MEDIUM 6 (CLIP) ×2 IMPLANT
CLIP TI WIDE RED SMALL 6 (CLIP) ×2 IMPLANT
CRADLE DONUT ADULT HEAD (MISCELLANEOUS) ×2 IMPLANT
DECANTER SPIKE VIAL GLASS SM (MISCELLANEOUS) IMPLANT
DRAIN HEMOVAC 1/8 X 5 (WOUND CARE) IMPLANT
ELECT REM PT RETURN 9FT ADLT (ELECTROSURGICAL) ×2
ELECTRODE REM PT RTRN 9FT ADLT (ELECTROSURGICAL) ×1 IMPLANT
EVACUATOR SILICONE 100CC (DRAIN) IMPLANT
GAUZE SPONGE 4X4 12PLY STRL (GAUZE/BANDAGES/DRESSINGS) ×2 IMPLANT
GEL ULTRASOUND 20GR AQUASONIC (MISCELLANEOUS) IMPLANT
GLOVE BIO SURGEON STRL SZ 6.5 (GLOVE) ×4 IMPLANT
GLOVE BIO SURGEON STRL SZ7 (GLOVE) ×2 IMPLANT
GLOVE BIO SURGEON STRL SZ7.5 (GLOVE) ×2 IMPLANT
GLOVE BIOGEL PI IND STRL 6.5 (GLOVE) ×3 IMPLANT
GLOVE BIOGEL PI IND STRL 7.0 (GLOVE) ×1 IMPLANT
GLOVE BIOGEL PI IND STRL 7.5 (GLOVE) ×1 IMPLANT
GLOVE BIOGEL PI INDICATOR 6.5 (GLOVE) ×3
GLOVE BIOGEL PI INDICATOR 7.0 (GLOVE) ×1
GLOVE BIOGEL PI INDICATOR 7.5 (GLOVE) ×1
GLOVE ECLIPSE 7.0 STRL STRAW (GLOVE) ×2 IMPLANT
GOWN STRL REUS W/ TWL LRG LVL3 (GOWN DISPOSABLE) ×3 IMPLANT
GOWN STRL REUS W/TWL LRG LVL3 (GOWN DISPOSABLE) ×3
HEMOSTAT SPONGE AVITENE ULTRA (HEMOSTASIS) ×2 IMPLANT
KIT BASIN OR (CUSTOM PROCEDURE TRAY) ×2 IMPLANT
KIT ROOM TURNOVER OR (KITS) ×2 IMPLANT
LIQUID BAND (GAUZE/BANDAGES/DRESSINGS) ×2 IMPLANT
LOOP VESSEL MINI RED (MISCELLANEOUS) IMPLANT
NEEDLE HYPO 25GX1X1/2 BEV (NEEDLE) IMPLANT
NS IRRIG 1000ML POUR BTL (IV SOLUTION) ×4 IMPLANT
PACK CAROTID (CUSTOM PROCEDURE TRAY) ×2 IMPLANT
PAD ARMBOARD 7.5X6 YLW CONV (MISCELLANEOUS) ×4 IMPLANT
PATCH HEMASHIELD 8X75 (Vascular Products) ×2 IMPLANT
SHUNT CAROTID BYPASS 10 (VASCULAR PRODUCTS) ×2 IMPLANT
SHUNT CAROTID BYPASS 12FRX15.5 (VASCULAR PRODUCTS) IMPLANT
SPONGE INTESTINAL PEANUT (DISPOSABLE) IMPLANT
SPONGE SURGIFOAM ABS GEL 100 (HEMOSTASIS) IMPLANT
SUT ETHILON 3 0 PS 1 (SUTURE) IMPLANT
SUT PROLENE 6 0 CC (SUTURE) ×4 IMPLANT
SUT PROLENE 7 0 BV 1 (SUTURE) ×2 IMPLANT
SUT PROLENE 7 0 BV1 MDA (SUTURE) ×2 IMPLANT
SUT SILK 3 0 (SUTURE) ×1
SUT SILK 3 0 TIES 17X18 (SUTURE)
SUT SILK 3-0 18XBRD TIE 12 (SUTURE) ×1 IMPLANT
SUT SILK 3-0 18XBRD TIE BLK (SUTURE) IMPLANT
SUT VIC AB 3-0 SH 27 (SUTURE) ×1
SUT VIC AB 3-0 SH 27X BRD (SUTURE) ×1 IMPLANT
SUT VICRYL 4-0 PS2 18IN ABS (SUTURE) ×2 IMPLANT
SYR CONTROL 10ML LL (SYRINGE) IMPLANT
WATER STERILE IRR 1000ML POUR (IV SOLUTION) ×2 IMPLANT

## 2015-10-12 NOTE — Anesthesia Procedure Notes (Signed)
Procedure Name: Intubation Date/Time: 10/12/2015 7:42 AM Performed by: Jacquiline Doe A Pre-anesthesia Checklist: Patient identified Patient Re-evaluated:Patient Re-evaluated prior to inductionOxygen Delivery Method: Circle system utilized and Simple face mask Preoxygenation: Pre-oxygenation with 100% oxygen Intubation Type: IV induction Ventilation: Two handed mask ventilation required and Oral airway inserted - appropriate to patient size Laryngoscope Size: Mac and 3 Grade View: Grade I Tube type: Oral Tube size: 7.5 mm Number of attempts: 1 Airway Equipment and Method: Stylet and Oral airway Placement Confirmation: ETT inserted through vocal cords under direct vision,  positive ETCO2,  CO2 detector and breath sounds checked- equal and bilateral Secured at: 24 cm Tube secured with: Tape Dental Injury: Teeth and Oropharynx as per pre-operative assessment

## 2015-10-12 NOTE — H&P (View-Only) (Signed)
[facility] [place] HISTORY AND PHYSICAL   CC: follow up s/p AAA repair, PAOD,  and left CEA   History of Present Illness:   Kevin [name] is a 75 y.o. male patient of Dr. Oneida Alar who presents for routine follow up s/p open abdominal aortic aneurysm repair (2010, Dr. Oneida Alar) and left CEA (2006).  The patient has had left lower quadrant chronic abdominal pain that he attributes to an inguinal hernia, states Dr. Arnoldo Morale aware.  Has chronic intermittent right hip pain and low back pain, aggravated by certain activities; his hip pain was evaluated by Dr. Ricard Dillon, pt states he was told that his hip is fine but his lumbar spine has issues. He states pain management has not returned his phone call x2, has not been able to attend yet, is becoming discouraged with them.  Had TIA in 2013: mild left facial and left leg tingling that lasted about 30 minutes, was evaluated at [facility] ED. He denies any further TIA activity.  He denies non healing wounds. 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 His walking is limited by his low back pain, but still is able to mow his lawn, has to stop every 10 minutes.  He had a recent cardiac echo and saw his PCP re this. Pt states he has no known cardiac problems.   He has swelling in his feet and ankles for months that wife states resolves somewhat with overnight elevation; states this was addressed by his PCP.  Pt Diabetic: No Pt smoker: former smoker, quit in 1989  Pt meds include: Statin :Yes Betablocker: Yes ASA: No Other anticoagulants/antiplatelets: Plavix    Current Outpatient Prescriptions  Medication Sig Dispense Refill  . amLODipine (NORVASC) 10 MG tablet Take 1 tablet by mouth  daily 90 tablet 3  . atorvastatin (LIPITOR) 20 MG tablet Take 1 tablet by mouth  daily 90 tablet 3  . bisoprolol  (ZEBETA) 10 MG tablet Take 1 tablet (10 mg total) by mouth daily. 90 tablet 3  . Cholecalciferol (VITAMIN D-3) 1000 UNITS CAPS Take 1 capsule by mouth daily.    . clopidogrel (PLAVIX) 75 MG tablet Take 1 tablet by mouth  daily 90 tablet 3  . diazepam (VALIUM) 5 MG tablet Take 0.5-1 tablets (2.5-5 mg total) by mouth every 6 (six) hours as needed for muscle spasms or sedation. 40 tablet 2  . fenofibrate 160 MG tablet Take 1 tablet (160 mg total) by mouth daily. 90 tablet 3  . furosemide (LASIX) 20 MG tablet Take 1 tablet (20 mg total) by mouth every other day. 90 tablet 3  . gabapentin (NEURONTIN) 300 MG capsule Take 300 mg by mouth 2 (two) times daily.    . Melatonin 3 MG TABS Take 3 mg by mouth at bedtime.     . methocarbamol (ROBAXIN) 500 MG tablet Take 500 mg by mouth 2 (two) times daily.    Marland Kitchen oxyCODONE-acetaminophen (PERCOCET/ROXICET) 5-325 MG tablet Take 1 tablet by mouth 2 (two) times daily.    . tamsulosin (FLOMAX) 0.4 MG CAPS capsule Take 1 capsule by mouth  daily 90 capsule 3  . tiZANidine (ZANAFLEX) 4 MG tablet Take 1 tablet by mouth  every 6 hours as needed for muscle spasms 90 tablet 1  . vitamin B-12 (CYANOCOBALAMIN) 1000 MCG tablet Take 1,000 mcg by mouth daily.  No current facility-administered medications for this visit.    Past Medical History  Diagnosis Date  . Hyperlipidemia   . 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     . Arthritis     DDD- Lower Back  . Muscle spasm     takes Zanaflex daily as needed  . Insomnia     takes Melatonin nightly  . TIA (transient ischemic attack)     x 2;takes Plavix daily  . Rheumatoid arthritis ([place])   . Joint pain   . Joint swelling   . History of colon polyps   . Diverticulosis   . Urinary frequency     takes Flomax daily  . Urinary urgency   . History of blood transfusion     "probably when they did aortic aneurysm"  . Cataracts, bilateral     immature  . Rheumatic fever 1946  . Bradycardia   .  Hypertension   . TIA (transient ischemic attack) 10/2011  . Chronic lower back pain   . Chronic kidney disease   . Pneumonia     hx of   . CAP (community acquired pneumonia) 03/17/2015    "THAT'S WHAT THEY ARE THINKING; THEY ARE NOT SURE"  . Elevated lactic acid level 03/17/2015    Social History Social History  Substance Use Topics  . Smoking status: Former Smoker -- 2.00 packs/day for 28 years    Types: Cigarettes    Quit date: 05/30/1989  . Smokeless tobacco: Never Used     Comment: quit smoking "in my 69's"  . Alcohol Use: Yes     Comment: 03/17/2015 "might have 2 drinks/year'    Family History Family History  Problem Relation Age of Onset  . Parkinsonism Father   . Dementia Father   . Cancer Father     Throat  . Colon cancer Mother     died in 51s  . Cancer Mother     Colon    Surgical History Past Surgical History  Procedure Laterality Date  . Abdominal aortic aneurysm repair  2010  . Carotid endarterectomy Left 09/20/2004  . Tonsillectomy    . Colonoscopy    . Upper gi endoscopy    . Inguinal hernia repair Left 02/05/2013    Procedure: HERNIA REPAIR INGUINAL ADULT;  Surgeon: Joyice Faster. Cornett, MD;  Location: [place];  Service: General;  Laterality: Left;  . Insertion of mesh Left 02/05/2013    Procedure: INSERTION OF MESH;  Surgeon: Joyice Faster. Cornett, MD;  Location: [place];  Service: General;  Laterality: Left;  . Testiclar cyst excision Left   . Vasectomy    . Shoulder arthroscopy with rotator cuff repair and subacromial decompression Left 05/01/2014    Procedure: LEFT ARTHROSCOPY SHOULDER SUBACROMIAL DECOMPRESSION,DISTAL CLAVICAL RESECTION AND ROTATOR CUFF REPAIR;  Surgeon: Marin Shutter, MD;  Location: [place];  Service: Orthopedics;  Laterality: Left;  . Inguinal hernia repair Left   . Refractive surgery      2016-2017    No Known Allergies  Current Outpatient Prescriptions  Medication Sig Dispense Refill  .  amLODipine (NORVASC) 10 MG tablet Take 1 tablet by mouth  daily 90 tablet 3  . atorvastatin (LIPITOR) 20 MG tablet Take 1 tablet by mouth  daily 90 tablet 3  . bisoprolol (ZEBETA) 10 MG tablet Take 1 tablet (10 mg total) by mouth daily. 90 tablet 3  . Cholecalciferol (VITAMIN D-3) 1000 UNITS CAPS Take 1 capsule by  mouth daily.    . clopidogrel (PLAVIX) 75 MG tablet Take 1 tablet by mouth  daily 90 tablet 3  . diazepam (VALIUM) 5 MG tablet Take 0.5-1 tablets (2.5-5 mg total) by mouth every 6 (six) hours as needed for muscle spasms or sedation. 40 tablet 2  . fenofibrate 160 MG tablet Take 1 tablet (160 mg total) by mouth daily. 90 tablet 3  . furosemide (LASIX) 20 MG tablet Take 1 tablet (20 mg total) by mouth every other day. 90 tablet 3  . gabapentin (NEURONTIN) 300 MG capsule Take 300 mg by mouth 2 (two) times daily.    . Melatonin 3 MG TABS Take 3 mg by mouth at bedtime.     . methocarbamol (ROBAXIN) 500 MG tablet Take 500 mg by mouth 2 (two) times daily.    Marland Kitchen oxyCODONE-acetaminophen (PERCOCET/ROXICET) 5-325 MG tablet Take 1 tablet by mouth 2 (two) times daily.    . tamsulosin (FLOMAX) 0.4 MG CAPS capsule Take 1 capsule by mouth  daily 90 capsule 3  . tiZANidine (ZANAFLEX) 4 MG tablet Take 1 tablet by mouth  every 6 hours as needed for muscle spasms 90 tablet 1  . vitamin B-12 (CYANOCOBALAMIN) 1000 MCG tablet Take 1,000 mcg by mouth daily.     No current facility-administered medications for this visit.     REVIEW OF SYSTEMS: See HPI for pertinent positives and negatives.  Physical Examination Filed Vitals:   09/17/15 1056 09/17/15 1113  BP: 150/72 143/71  Pulse: 49   Height: 5\' 7"  (1.702 m)   Weight: 202 lb 4.8 oz (91.763 kg)   SpO2: 95%    Body mass index is 31.68 kg/(m^2).  General: A&O x 3, WD, obese male.  Pulmonary: Sym exp, good air movt, CTAB, no rales, rhonchi, or wheezing.  Cardiac: RRR, Nl S1, S2, no detected murmurs   Vascular:  Vessel  Right  Left    Radial  2+Palpable  2+Palpable   Carotid  audible, without bruit  audible, without bruit   Aorta  Not palpable  N/A   Femoral  1+Palpable  2+Palpable   Popliteal  Not palpable  Not palpable   PT  Biphasic by Doppler Biphasic by Doppler  DP  1+ palpable,Biphasic by Doppler Monophasic by Doppler   Gastrointestinal: soft, NTND, -G/R, - HSM, - palpable masses, - CVAT B.  Musculoskeletal: M/S 5/5 throughout, Extremities without ischemic changes. Pitting edema in both feet and ankles: 2+ right, 1+ left.  Neurologic: Pain and light touch intact in extremities, Motor exam as listed above. CN 2-12 intact.                Non-Invasive Vascular Imaging (09/17/2015):   CEREBROVASCULAR DUPLEX EVALUATION    INDICATION: Carotid artery disease    PREVIOUS INTERVENTION(S): Left carotid endarterectomy 2006    DUPLEX EXAM: Carotid duplex    RIGHT  LEFT  Peak Systolic Velocities (cm/s) End Diastolic Velocities (cm/s) Plaque LOCATION Peak Systolic Velocities (cm/s) End Diastolic Velocities (cm/s) Plaque  56 8  CCA PROXIMAL 76 13 HT  34 9 HM CCA MID 84 16 HT  37 9 HT CCA DISTAL 90 19 HT  183 14 HT ECA 118 16 HM  477 128 HT ICA PROXIMAL 94 24 HM  144 39  ICA MID 94 24   85 25  ICA DISTAL 69 21     14.0 ICA / CCA Ratio (PSV) N/A  Antegrade Vertebral Flow Antegrade  - Brachial Systolic Pressure (mmHg) -  Triphasic Brachial  Artery Waveforms Triphasic    Plaque Morphology:  HM = Homogeneous, HT = Heterogeneous, CP = Calcific Plaque, SP = Smooth Plaque, IP = Irregular Plaque     ADDITIONAL FINDINGS: Multiphasic subclavian arteries    IMPRESSION: 1. 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 Patent left carotid endarterectomy site with no evidence for restenosis.    Compared to the previous exam:  Significant change on the right since exam 09/11/2014    ABI (Date: 09/17/2015)  R: 0.67 (0.70, 09/11/14), DP: monophasic, PT: monophasic,  TBI: 0.53  L: 0.80 (0.76), DP: monophasic, PT: monophasic, TBI: 0.48   ASSESSMENT:  Kevin [name] is a 75 y.o. male who is s/p  open abdominal aortic aneurysm repair (2010, Dr. Oneida Alar) and left CEA (2006).  He has had no TIA since 2013, has no claudication, has no signs of ischemia in his feet/legs. His walking is limited by his chronic low back pain, but still is able to mow his lawn, has to stop every 10 minutes.  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 Significant change on the right since exam 09/11/2014.   PLAN:   Dr. Oneida Alar spoke with pt and wife and examined pt. Based on today's exam and non-invasive vascular lab results, the patient will be referred for cardiology risk stratification and stress testing prior to right CEA by Dr. Oneida Alar.   I discussed in depth with the patient the nature of atherosclerosis, and emphasized the importance of maximal medical management including strict control of blood pressure, blood glucose, and lipid levels, obtaining regular exercise, and cessation of smoking.  The patient is aware that without maximal medical management the underlying atherosclerotic disease process will progress, limiting the benefit of any interventions.  The patient was given information about stroke prevention and what symptoms should prompt the patient to seek immediate medical care.  The patient was given information about PAD including signs, symptoms, treatment, what symptoms should prompt the patient to seek immediate medical care, and risk reduction measures to take. Thank you for allowing [place] to participate in this patient's care.  Clemon Chambers, RN, MSN, FNP-C [facility] Office: [phone]  Clinic MD: [facility]   09/17/2015 11:19 AM

## 2015-10-12 NOTE — Op Note (Signed)
Procedure Right carotid endarterectomy  Preoperative diagnosis: High-grade asymptomatic right internal carotid artery stenosis  Postoperative diagnosis: Same  Anesthesia General  Asst.: Gerri Lins, [facility]  Operative findings: #1 greater than 80% right internal carotid stenosis fairly focal                                                            #2 Dacron patch           #3 10 Fr shunt  Operative details: After obtaining informed consent, the patient was taken to the operating room. The patient was placed in a supine position on the operating room table. After induction of general anesthesia and endotracheal intubation, the patient's entire neck and chest was prepped and draped in the usual sterile fashion. An oblique incision was made on the right aspect of the patient's neck anterior to the border the right sternocleidomastoid muscle. The incision was carried into the subcutaneous tissues and through the platysma. The sternocleidomastoid muscle was identified and reflected laterally. The omohyoid muscle was identified and this was divided with cautery. The common carotid artery was then found at the base of the incision this was dissected free circumferentially. It was fairly soft on palpation.  The vagus nerve was identified and protected. Dissection was then carried up to the level carotid bifurcation.   The hyperglossal nerve was well above the primary area of dissection. This was identified and protected.  The internal carotid artery was dissected free circumferentially just below the level of the hypoglossal nerve and it was soft in character at this location and above any palpable disease. A vessel loop was placed around this.  Next the external carotid and superior thyroid arteries were dissected free circumferentially and vessel loops were placed around these. The patient was given 9000 units of intravenous heparin.  After 2 minutes of circulation time and raising the mean arterial  pressure to 90 mm mercury, the distal internal carotid artery was controlled with small bulldog clamp. The external carotid and superior thyroid arteries were controlled with vessel loops. The common carotid artery was controlled with a peripheral DeBakey clamp. A longitudinal opening was made in the common carotid artery just below the bifurcation. The arteriotomy was extended distally up into the internal carotid with Potts scissors. There was a large fairly focal friable plaque with greater than 80% stenosis in the internal carotid.  An 10 Fr shunt was brought onto the field and fashioned to fit the patient's artery.  This was threaded into the distal internal carotid artery and allowed to backbleed thoroughly.  There was pulsatile backbleeding.  This was then threaded into the common carotid and secured with a Rummel tourniquet.  There was no air at this point and flow was restored to the brain.  Attention was then turned to the common carotid artery once again. A suitable endarterectomy plane was obtained and endarterectomy was begun in the common carotid artery and a good proximal endpoint was obtained. An eversion endarterectomy was performed on the external carotid artery and a good endpoint was obtained. The plaque was then elevated in the internal carotid artery and a nice feathered distal endpoint was also obtained. This was lifting slightly so I tacked the posterior wall with several 7 0 prolene sutures. The plaque was passed off the table.  All loose debris was then removed from the carotid bed and everything was thoroughly irrigated with heparinized saline. A Dacron patch was then brought on to the operative field and this was sewn on as a patch angioplasty using a running 6-0 Prolene suture. Prior to completion of the anastomosis the internal carotid artery was thoroughly backbled. This was then controlled again with a fine bulldog clamp.  The common carotid was thoroughly flushed forward. The external  carotid was also thoroughly backbled.  The remainder of the patch was completed and the anastomosis was secured. Flow was then restored first retrograde from the external carotid into the carotid bed then antegrade from the common carotid to the external carotid artery and after approximately 5 cardiac cycles to the internal carotid artery. Doppler was used to evaluate the external/internal and common carotid arteries and these all had good Doppler flow. Hemostasis was obtained with 1 additional repair suture. The patient was also given 90 mg of Protamine.      The platysma muscle was reapproximated using a running 3-0 Vicryl suture. The skin was closed with 4 0 Vicryl subcuticular stitch.  The patient was awakened in the operating room and was moving upper and lower extremities symmetrically and following commands.  The patient was stable on arrival to the PACU.  [name], MD [facility] [place] Office: [phone] Pager: [phone]

## 2015-10-12 NOTE — Anesthesia Preprocedure Evaluation (Signed)
Anesthesia Evaluation  Patient identified by MRN, date of birth, ID band Patient awake    Reviewed: Allergy & Precautions, H&P , NPO status , Patient's Chart, lab work & pertinent test results  Airway Mallampati: III  TM Distance: >3 FB Neck ROM: Full    Dental no notable dental hx. (+) Edentulous Upper, Edentulous Lower, Dental Advisory Given   Pulmonary neg pulmonary ROS, former smoker,    Pulmonary exam normal breath sounds clear to auscultation       Cardiovascular hypertension, Pt. on medications + Peripheral Vascular Disease   Rhythm:Regular Rate:Normal     Neuro/Psych TIAnegative neurological ROS  negative psych ROS   GI/Hepatic negative GI ROS, Neg liver ROS,   Endo/Other  negative endocrine ROS  Renal/GU Renal InsufficiencyRenal disease  negative genitourinary   Musculoskeletal  (+) Arthritis ,   Abdominal   Peds  Hematology negative hematology ROS (+) anemia ,   Anesthesia Other Findings   Reproductive/Obstetrics negative OB ROS                             Anesthesia Physical Anesthesia Plan  ASA: III  Anesthesia Plan: General   Post-op Pain Management:    Induction: Intravenous  Airway Management Planned: Oral ETT  Additional Equipment: Arterial line  Intra-op Plan:   Post-operative Plan: Extubation in OR and Possible Post-op intubation/ventilation  Informed Consent: I have reviewed the patients History and Physical, chart, labs and discussed the procedure including the risks, benefits and alternatives for the proposed anesthesia with the patient or authorized representative who has indicated his/her understanding and acceptance.   Dental advisory given  Plan Discussed with: CRNA  Anesthesia Plan Comments:         Anesthesia Quick Evaluation

## 2015-10-12 NOTE — Anesthesia Postprocedure Evaluation (Signed)
Anesthesia Post Note  Patient: Kevin [name]  Procedure(s) Performed: Procedure(s) (LRB): RIGHT CAROTID ENDARTERECTOMY WITH PATCH ANGIOPLASTY (Right)  Patient location during evaluation: PACU Anesthesia Type: General Level of consciousness: awake and alert Pain management: pain level controlled Vital Signs Assessment: post-procedure vital signs reviewed and stable Respiratory status: spontaneous breathing, nonlabored ventilation, respiratory function stable and patient connected to nasal cannula oxygen Cardiovascular status: blood pressure returned to baseline and stable Postop Assessment: no signs of nausea or vomiting Anesthetic complications: no    Last Vitals:  Filed Vitals:   10/12/15 1200 10/12/15 1215  BP: 123/54 125/56  Pulse: 51 51  Temp:    Resp: 16 17    Last Pain:  Filed Vitals:   10/12/15 1228  PainSc: 0-No pain                 Anistyn Graddy,W. EDMOND

## 2015-10-12 NOTE — Transfer of Care (Signed)
Immediate Anesthesia Transfer of Care Note  Patient: Kevin [name]  Procedure(s) Performed: Procedure(s): RIGHT CAROTID ENDARTERECTOMY WITH PATCH ANGIOPLASTY (Right)  Patient Location: PACU  Anesthesia Type:General  Level of Consciousness: awake, oriented, sedated, patient cooperative and responds to stimulation  Airway & Oxygen Therapy: Patient Spontanous Breathing and Patient connected to nasal cannula oxygen  Post-op Assessment: Report given to RN, Post -op Vital signs reviewed and stable, Patient moving all extremities and Patient moving all extremities X 4  Post vital signs: Reviewed and stable  Last Vitals:  Filed Vitals:   10/12/15 0612  BP: 162/55  Pulse: 53  Temp: 36.8 C  Resp: 20    Last Pain: There were no vitals filed for this visit.    Patients Stated Pain Goal: 3 ([id number])  Complications: No apparent anesthesia complications

## 2015-10-12 NOTE — Progress Notes (Addendum)
      Alert Moving all 4 extremities, right neck incision clean and dry without hematoma No tongue deviation and smile is symmetric   S/p Right CEA Disposition stable pending 3S bed   COLLINS, EMMA MAUREEN PA-C  Awake alert no hematoma neuro intact Advance diet Plan d/c am  [name], MD [facility] [place] Office: [phone] Pager: [phone]

## 2015-10-12 NOTE — Interval H&P Note (Signed)
History and Physical Interval Note:  10/12/2015 7:26 AM  Kevin [name]  has presented today for surgery, with the diagnosis of Right carotid artery stenosis I65.21  The various methods of treatment have been discussed with the patient and family. After consideration of risks, benefits and other options for treatment, the patient has consented to  Procedure(s): ENDARTERECTOMY CAROTID (Right) as a surgical intervention .  The patient's history has been reviewed, patient examined, no change in status, stable for surgery.  I have reviewed the patient's chart and labs.  Questions were answered to the patient's satisfaction.     [name]

## 2015-10-13 ENCOUNTER — Encounter (HOSPITAL_COMMUNITY): Payer: Self-pay | Admitting: Vascular Surgery

## 2015-10-13 LAB — BASIC METABOLIC PANEL
ANION GAP: 12 (ref 5–15)
BUN: 17 mg/dL (ref 6–20)
CO2: 23 mmol/L (ref 22–32)
Calcium: 8.1 mg/dL — ABNORMAL LOW (ref 8.9–10.3)
Chloride: 104 mmol/L (ref 101–111)
Creatinine, Ser: 1.84 mg/dL — ABNORMAL HIGH (ref 0.61–1.24)
GFR calc Af Amer: 39 mL/min — ABNORMAL LOW (ref >60)
GFR, EST NON AFRICAN AMERICAN: 34 mL/min — AB (ref >60)
GLUCOSE: 104 mg/dL — AB (ref 65–99)
POTASSIUM: 4 mmol/L (ref 3.5–5.1)
Sodium: 139 mmol/L (ref 135–145)

## 2015-10-13 LAB — CBC
HEMATOCRIT: 31.8 % — AB (ref 39.0–52.0)
Hemoglobin: 10.4 g/dL — ABNORMAL LOW (ref 13.0–17.0)
MCH: 29.5 pg (ref 26.0–34.0)
MCHC: 32.7 g/dL (ref 30.0–36.0)
MCV: 90.1 fL (ref 78.0–100.0)
PLATELETS: 166 10*3/uL (ref 150–400)
RBC: 3.53 MIL/uL — AB (ref 4.22–5.81)
RDW: 14 % (ref 11.5–15.5)
WBC: 8 10*3/uL (ref 4.0–10.5)

## 2015-10-13 NOTE — Care Management Note (Signed)
Case Management Note  Patient Details  Name: JAHBARI YOUKER MRN: [id number] Date of Birth: [birth year]  Subjective/Objective:  Patient is from home, s/p cea for dc today,no needs.                   Action/Plan:   Expected Discharge Date:                  Expected Discharge Plan:  Home/Self Care  In-House Referral:     Discharge planning Services  CM Consult  Post Acute Care Choice:    Choice offered to:     DME Arranged:    DME Agency:     HH Arranged:    [place] Agency:     Status of Service:  Completed, signed off  Medicare Important Message Given:    Date Medicare IM Given:    Medicare IM give by:    Date Additional Medicare IM Given:    Additional Medicare Important Message give by:     If discussed at [place] of Stay Meetings, dates discussed:    Additional Comments:  Zenon Mayo, RN 10/13/2015, 11:58 AM

## 2015-10-13 NOTE — Progress Notes (Addendum)
[facility] [place]  Subjective  - Doing well.  He walked and has voided.   Objective 147/55 61 98 F (36.7 C) (Oral) 17 98%  Intake/Output Summary (Last 24 hours) at 10/13/15 0710 Last data filed at 10/13/15 0400  Gross per 24 hour  Intake 3215.83 ml  Output   1570 ml  Net 1645.83 ml    No tongue deviation, smile symmetric Grip 5/5 equal bilaterally Incision with min. Edema soft, no frank hematoma Heart RRR Lungs non labored breathing  Assessment/Planning: POD # 1 right CEA  Pending tolerating breakfast he is stable for discharge home today F/U in 2 weeks with Dr. Carvel Getting, EMMA [facility] 10/13/2015 7:10 AM -- Agree with above Neuro intact Small amount of right neck swelling without focal hematoma Neuro intact D/c home  [name], MD [facility] [place]: [phone] Pager: [phone]   Laboratory Lab Results:  Recent Labs  10/12/15 1535 10/13/15 0350  WBC 10.3 8.0  HGB 11.2* 10.4*  HCT 32.6* 31.8*  PLT 164 166   BMET  Recent Labs  10/12/15 1535 10/13/15 0350  NA  --  139  K  --  4.0  CL  --  104  CO2  --  23  GLUCOSE  --  104*  BUN  --  17  CREATININE 1.87* 1.84*  CALCIUM  --  8.1*    COAG Lab Results  Component Value Date   INR 1.12 10/02/2015   INR 1.07 04/18/2014   INR 1.05 11/17/2011   No results found for: PTT

## 2015-10-13 NOTE — Clinical Documentation Improvement (Signed)
Vascular Surgery  Noted Creatinine 1.87, GFR  33, please provide a diagnosis for lab values, if appropriate for this admission. Thank you    CKD Stage I - GFR greater than or equal to 90  CKD Stage II - GFR 60-89  CKD Stage III - GFR 30-59  CKD Stage IV - GFR 15-29  CKD Stage V - GFR < 15  Other condition  Unable to clinically determine   Supporting Information: HTN, Peripheral Vascular Disease  Treatment:  NS @ 125 ml/hr , 500 ml bolus NS     Please exercise your independent, professional judgment when responding. A specific answer is not anticipated or expected.   Thank You, [place] [phone]

## 2015-10-13 NOTE — Progress Notes (Signed)
Discharge packet given to patient and wife. Medication regimen and follow up appointments reviewed with patient. Will continue to monitor.

## 2015-10-14 ENCOUNTER — Other Ambulatory Visit: Payer: Self-pay | Admitting: Family Medicine

## 2015-10-15 ENCOUNTER — Other Ambulatory Visit: Payer: Self-pay | Admitting: Physician Assistant

## 2015-10-20 ENCOUNTER — Ambulatory Visit (INDEPENDENT_AMBULATORY_CARE_PROVIDER_SITE_OTHER): Payer: Medicare Other | Admitting: Family Medicine

## 2015-10-20 ENCOUNTER — Encounter: Payer: Self-pay | Admitting: Family Medicine

## 2015-10-20 VITALS — BP 138/82 | HR 54 | Temp 97.8°F | Ht 69.0 in | Wt 201.0 lb

## 2015-10-20 DIAGNOSIS — J189 Pneumonia, unspecified organism: Secondary | ICD-10-CM

## 2015-10-20 DIAGNOSIS — Y95 Nosocomial condition: Principal | ICD-10-CM

## 2015-10-20 MED ORDER — LEVOFLOXACIN 750 MG PO TABS: 750.0000 mg | ORAL_TABLET | ORAL | Status: DC

## 2015-10-20 NOTE — Discharge Summary (Signed)
[facility] Discharge Summary   Patient ID:  Kevin [name] MRN: [id number] DOB/AGE: [birth year] 75 y.o.  Admit date: 10/12/2015 Discharge date: 10/20/2015 Date of Surgery: 10/12/2015 Surgeon: Surgeon(s): Elam Dutch, MD  Admission Diagnosis: Right carotid artery stenosis I65.21  Discharge Diagnoses:  Right carotid artery stenosis I65.21  Secondary Diagnoses: Past Medical History  Diagnosis Date  . Hyperlipidemia   . Arthritis     DDD- Lower Back  . Muscle spasm     takes Zanaflex daily as needed  . Insomnia     takes Melatonin nightly  . TIA (transient ischemic attack)     x 2;takes Plavix daily  . Rheumatoid arthritis ([place])   . Joint pain   . History of colon polyps     benign  . Diverticulosis   . Urinary frequency     takes Flomax daily  . Urinary urgency   . History of blood transfusion     "probably when they did aortic aneurysm"  . Rheumatic fever 1946  . Bradycardia   . Chronic lower back pain     DDD  . Pneumonia     hx of   . CAP (community acquired pneumonia) 03/17/2015    "THAT'S WHAT THEY ARE THINKING; THEY ARE NOT SURE"  . Hypertension     takes Amlodipine and Zebeta daily  . Shortness of breath dyspnea     with exertion but lasts very short period of time  . Short-term memory loss     minimal  . Peripheral edema     takes Furosemide daily  . Enlarged prostate     takes Flomax daily  . 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     . CKD (chronic kidney disease)     Dr. Corliss Parish    Procedure(s): RIGHT CAROTID ENDARTERECTOMY WITH PATCH ANGIOPLASTY  Discharged Condition: good  HPI: Kevin [name] is a 75 y.o. male who is s/p open abdominal aortic aneurysm repair (2010, Dr. Oneida Alar) and left CEA (2006).   His carotid Duplex suggests 80 - 99% right internal carotid artery stenosis and a patent left carotid endarterectomy site with no evidence for restenosis. Significant change on the right  since exam 09/11/2014.   Hospital Course:  Kevin [name] is a 75 y.o. male is S/P Procedure(s): RIGHT CAROTID ENDARTERECTOMY WITH PATCH ANGIOPLASTY  No tongue deviation, smile symmetric Grip 5/5 equal bilaterally Incision with min. Edema soft, no frank hematoma Heart RRR Lungs non labored breathing  Assessment/Planning: POD # 1 right CEA  Pending tolerating breakfast he is stable for discharge home today F/U in 2 weeks with Dr. Oneida Alar  Disposition stable for discharge.    Significant Diagnostic Studies: CBC Lab Results  Component Value Date   WBC 8.0 10/13/2015   HGB 10.4* 10/13/2015   HCT 31.8* 10/13/2015   MCV 90.1 10/13/2015   PLT 166 10/13/2015    BMET    Component Value Date/Time   NA 139 10/13/2015 0350   NA 136* 01/27/2015   K 4.0 10/13/2015 0350   CL 104 10/13/2015 0350   CO2 23 10/13/2015 0350   GLUCOSE 104* 10/13/2015 0350   BUN 17 10/13/2015 0350   BUN 32* 01/27/2015   CREATININE 1.84* 10/13/2015 0350   CREATININE 1.8* 01/27/2015   CALCIUM 8.1* 10/13/2015 0350   GFRNONAA 34* 10/13/2015 0350   GFRAA 39* 10/13/2015 0350   COAG Lab Results  Component Value Date   INR 1.12 10/02/2015  INR 1.07 04/18/2014   INR 1.05 11/17/2011     Disposition:  Discharge to :Home Discharge Instructions    Call MD for:  redness, tenderness, or signs of infection (pain, swelling, bleeding, redness, odor or green/yellow discharge around incision site)    Complete by:  As directed      Call MD for:  severe or increased pain, loss or decreased feeling  in affected limb(s)    Complete by:  As directed      Call MD for:  temperature >100.5    Complete by:  As directed      Discharge instructions    Complete by:  As directed   You may shower in 24 hours     Discharge patient    Complete by:  As directed   Discharge pt to home     Driving Restrictions    Complete by:  As directed   No driving for 1 week     Increase activity slowly    Complete by:  As directed    Walk with assistance use walker or cane as needed     Lifting restrictions    Complete by:  As directed   No lifting for 6 weeks     Resume previous diet    Complete by:  As directed             Medication List    STOP taking these medications        gabapentin 300 MG capsule  Commonly known as:  NEURONTIN      TAKE these medications        amLODipine 10 MG tablet  Commonly known as:  NORVASC  Take 1 tablet by mouth  daily     atorvastatin 20 MG tablet  Commonly known as:  LIPITOR  Take 1 tablet by mouth  daily     bisoprolol 10 MG tablet  Commonly known as:  ZEBETA  Take 1 tablet (10 mg total) by mouth daily.     clopidogrel 75 MG tablet  Commonly known as:  PLAVIX  Take 1 tablet by mouth  daily     diazepam 5 MG tablet  Commonly known as:  VALIUM  Take 0.5-1 tablets (2.5-5 mg total) by mouth every 6 (six) hours as needed for muscle spasms or sedation.     fenofibrate 160 MG tablet  Take 1 tablet (160 mg total) by mouth daily.     furosemide 20 MG tablet  Commonly known as:  LASIX  Take 1 tablet (20 mg total) by mouth every other day.     Melatonin 3 MG Tabs  Take 5 mg by mouth at bedtime.     methocarbamol 500 MG tablet  Commonly known as:  ROBAXIN  Take 500 mg by mouth 2 (two) times daily.     oxyCODONE-acetaminophen 5-325 MG tablet  Commonly known as:  PERCOCET/ROXICET  Take 1 tablet by mouth every 8 (eight) hours as needed for moderate pain.     tamsulosin 0.4 MG Caps capsule  Commonly known as:  FLOMAX  Take 1 capsule by mouth  daily     tiZANidine 4 MG tablet  Commonly known as:  ZANAFLEX  Take 1 tablet by mouth  every 6 hours as needed for muscle spasms     vitamin B-12 1000 MCG tablet  Commonly known as:  CYANOCOBALAMIN  Take 1,000 mcg by mouth daily.     Vitamin D-3 1000 units Caps  Take 1 capsule by  mouth daily.       Verbal and written Discharge instructions given to the patient. Wound care per Discharge  AVS   Signed: Laurence Slate [facility] 10/20/2015, 2:46 PM  --- For VQI Registry use --- Instructions: Press F2 to tab through selections.  Delete question if not applicable.   Modified Rankin score at D/C (0-6): Rankin Score=0  IV medication needed for:  1. Hypertension: No 2. Hypotension: No  Post-op Complications: No  1. Post-op CVA or TIA: No  If yes: Event classification (right eye, left eye, right cortical, left cortical, verterobasilar, other):   If yes: Timing of event (intra-op, <6 hrs post-op, >=6 hrs post-op, unknown):   2. CN injury: No  If yes: CN  injuried   3. Myocardial infarction: No  If yes: Dx by (EKG or clinical, Troponin):   4.  CHF: No  5.  Dysrhythmia (new): No  6. Wound infection: No  7. Reperfusion symptoms: No  8. Return to OR: No  If yes: return to OR for (bleeding, neurologic, other CEA incision, other):   Discharge medications: Statin use:  Yes ASA use:  No  for medical reason   Beta blocker use:  no ACE-Inhibitor use:  No  for medical reason   P2Y12 Antagonist use: [ ]  None, [ x] Plavix, [ ]  Plasugrel, [ ]  Ticlopinine, [ ]  Ticagrelor, [ ]  Other, [ ]  No for medical reason, [ ]  Non-compliant, [ ]  Not-indicated Anti-coagulant use:  [x ] None, [ ]  Warfarin, [ ]  Rivaroxaban, [ ]  Dabigatran, [ ]  Other, [ ]  No for medical reason, [ ]  Non-compliant, [ ]  Not-indicated

## 2015-10-20 NOTE — Patient Instructions (Signed)
Likely pneumonia given findings on lung exam  May have been acquired in hospital so being aggressive on antibiotics  Take levaquin every other day until done (usually would be daily but with your kidney function dose would be too high)  Follow up if not improving by end of the week  Also monitor the neck as we discussed

## 2015-10-20 NOTE — Progress Notes (Signed)
Subjective:  Kevin [name] is a 75 y.o. year old very pleasant male patient who presents for/with See problem oriented charting ROS- no fever, nausea, vomiting. Does have some SOB, ,fatigue., cough. .see any ROS included in HPI as well.   Past Medical History-  Patient Active Problem List   Diagnosis Date Noted  . CKD (chronic kidney disease), stage III 01/27/2014    Priority: High  . PVD (peripheral vascular disease) ([place]) 01/23/2014    Priority: High  . AAA (abdominal aortic aneurysm, ruptured) ([place]) 01/17/2013    Priority: High  . History of CVA (cerebrovascular accident) 11/17/2011    Priority: High  . Chronic pain syndrome 12/23/2009    Priority: High  . Personal history of colonic polyps 05/15/2013    Priority: Medium  . Carotid Artery Stenosis L s/p endarterectomy 01/12/2012    Priority: Medium  . Essential tremor 10/05/2009    Priority: Medium  . UNSPECIFIED ANEMIA 12/05/2008    Priority: Medium  . BPH (benign prostatic hyperplasia) 06/04/2007    Priority: Medium  . Fasting hyperglycemia 02/05/2007    Priority: Medium  . Hyperlipidemia 01/03/2007    Priority: Medium  . Essential hypertension 01/03/2007    Priority: Medium  . Family history of malignant neoplasm of gastrointestinal tract 05/15/2013    Priority: Low  . Inguinal hernia 05/01/2012    Priority: Low  . BURSITIS, HIP 12/21/2009    Priority: Low  . ACTINIC KERATOSIS, EAR, LEFT 03/09/2009    Priority: Low  . Osteoarthritis of left knee 11/02/2007    Priority: Low  . 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 Carotid stenosis 10/12/2015  . Diastolic CHF ([place]) [id number]  . CAP (community acquired pneumonia) 03/17/2015    Medications- reviewed and updated Current Outpatient Prescriptions  Medication Sig Dispense Refill  . amLODipine (NORVASC) 10 MG tablet Take 1 tablet by mouth  daily 90 tablet 3  . atorvastatin (LIPITOR) 20 MG tablet Take 1 tablet by mouth  daily 90 tablet 3    . bisoprolol (ZEBETA) 10 MG tablet Take 1 tablet (10 mg total) by mouth daily. 90 tablet 3  . Cholecalciferol (VITAMIN D-3) 1000 UNITS CAPS Take 1 capsule by mouth daily.    . clopidogrel (PLAVIX) 75 MG tablet Take 1 tablet by mouth  daily 90 tablet 3  . diazepam (VALIUM) 5 MG tablet Take 0.5-1 tablets (2.5-5 mg total) by mouth every 6 (six) hours as needed for muscle spasms or sedation. 40 tablet 2  . fenofibrate 160 MG tablet Take 1 tablet (160 mg total) by mouth daily. 90 tablet 3  . furosemide (LASIX) 20 MG tablet Take 1 tablet (20 mg total) by mouth every other day. 90 tablet 3  . gabapentin (NEURONTIN) 300 MG capsule Take 1 capsule by mouth two times daily 180 capsule 1  . Melatonin 3 MG TABS Take 5 mg by mouth at bedtime.     . methocarbamol (ROBAXIN) 500 MG tablet Take 500 mg by mouth 2 (two) times daily.    Marland Kitchen oxyCODONE-acetaminophen (PERCOCET/ROXICET) 5-325 MG tablet Take 1 tablet by mouth every 8 (eight) hours as needed for moderate pain. 6 tablet 0  . tamsulosin (FLOMAX) 0.4 MG CAPS capsule Take 1 capsule by mouth  daily 90 capsule 3  . tiZANidine (ZANAFLEX) 4 MG tablet Take 1 tablet by mouth  every 6 hours as needed for muscle spasms 90 tablet 1  . vitamin B-12 (CYANOCOBALAMIN) 1000 MCG tablet Take 1,000 mcg by  mouth daily.    Marland Kitchen levofloxacin (LEVAQUIN) 750 MG tablet Take 1 tablet (750 mg total) by mouth every other day. 4 tablet 0   No current facility-administered medications for this visit.    Objective: BP 138/82 mmHg  Pulse 54  Temp(Src) 97.8 F (36.6 C) (Oral)  Ht 5\' 9"  (1.753 m)  Wt 201 lb (91.173 kg)  BMI 29.67 kg/m2  SpO2 95% Gen: NAD, resting comfortably, appears fatigued CV: RRR no murmurs rubs or gallops Lungs: occasional diffuse wheeze. No rhonchi. Crackles LLL in midaxillary line only.  Abdomen: soft/nontender/nondistended/normal bowel sounds. No rebound or guarding.  Ext: no edema Skin: warm, dry, see neck below Neuro: grossly normal, moves all  extremities  Right sided neck with diffuse swelling. Near incision there is no warmth or tenderness.        Assessment/Plan:  Fatigue, cough, shortness of breath S: got home last Tuesday from 2 days in hospital for carotid endarterectomy, symptoms started Thursday. Cough with brown sputum now white or yellow. Very fatigued and not improving. Mild shortness of breath. Having some rattling in chest with coughing and mild wheeze at times.   Patient also remarks about swelling in his neck. We reviewed pictures from surgery and it appears that swelling is decreasing but he just does not remember neck being as large with his history or left carotid endarterectomy.   A/P: 75 year old male with recent hospitalization for carotid endarterectomy now with cough, fatigue, and crackles in LLL concerning for hospital acquired pneumonia. We are going to treat with levaquin 750mg  every other day due to GFR in 30s. We discussed there is no coverage for MRSA. Also has some mild wheeze on exam and wonder if this could be bronchitis with some atelectasis in LLL. Given comorbidity (history cva, history ruptured AA, PVD, CKD) we opted to treat regardless. He declines going for x-ray   In addition I do not think the neck is infected and it appears to be improving but I will forward this note to vascular with pictures attached so they can view. I doubt something like abscess as cause of his fatigue.   Strict Return precautions advised.   Meds ordered this encounter  Medications  . levofloxacin (LEVAQUIN) 750 MG tablet    Sig: Take 1 tablet (750 mg total) by mouth every other day.    Dispense:  4 tablet    Refill:  0    Garret Reddish, MD

## 2015-10-21 ENCOUNTER — Encounter: Payer: Self-pay | Admitting: Vascular Surgery

## 2015-10-22 ENCOUNTER — Other Ambulatory Visit: Payer: Self-pay | Admitting: Pediatrics

## 2015-10-22 ENCOUNTER — Other Ambulatory Visit: Payer: Self-pay | Admitting: General Practice

## 2015-10-22 ENCOUNTER — Telehealth: Payer: Self-pay | Admitting: General Practice

## 2015-10-22 MED ORDER — DIAZEPAM 5 MG PO TABS: 2.5000 mg | ORAL_TABLET | Freq: Four times a day (QID) | ORAL | Status: DC | PRN

## 2015-10-22 NOTE — Telephone Encounter (Signed)
Yes thanks, may fill 

## 2015-10-22 NOTE — Telephone Encounter (Signed)
Pt was advised of refill being sent in.

## 2015-10-27 ENCOUNTER — Telehealth: Payer: Self-pay | Admitting: *Deleted

## 2015-10-27 NOTE — Telephone Encounter (Signed)
[facility] faxed a refill request for Diazepam.

## 2015-10-27 NOTE — Telephone Encounter (Signed)
Appears we did this 5/25 and I believe to [facility]- should not be getting in 2 places.

## 2015-10-28 NOTE — Telephone Encounter (Signed)
Spoke to pt, asked him if he picked up Rx for Diazepam from [facility]? Pt said yes. Told pt we received a fax from [facility] but we can not send to two places and wanted to make sure you got your Rx. Pt said okay, no problem and verbalized understanding.

## 2015-10-29 ENCOUNTER — Encounter: Payer: Self-pay | Admitting: Vascular Surgery

## 2015-10-29 ENCOUNTER — Ambulatory Visit (INDEPENDENT_AMBULATORY_CARE_PROVIDER_SITE_OTHER): Payer: Medicare Other | Admitting: Vascular Surgery

## 2015-10-29 VITALS — BP 159/76 | HR 54 | Temp 97.2°F | Ht 69.0 in | Wt 198.1 lb

## 2015-10-29 DIAGNOSIS — I6523 Occlusion and stenosis of bilateral carotid arteries: Secondary | ICD-10-CM

## 2015-10-29 NOTE — Progress Notes (Signed)
This is a 75 year old male who returns today for postoperative follow-up after recent right carotid endarterectomy. He apparently had a postoperative pneumonia which is now resolving. He denies any difficulty swallowing. He has had no symptoms of TIA amaurosis or stroke.  Physical exam:  Filed Vitals:   10/29/15 1029 10/29/15 1031  BP: 171/80 159/76  Pulse: 54   Temp: 97.2 F (36.2 C)   TempSrc: Oral   Height: 5\' 9"  (1.753 m)   Weight: 198 lb 1.6 oz (89.858 kg)   SpO2: 97%     Neck: Healing incision  Neuro: Symmetric upper and lower extremity motor strength which is 5 over 5, tongue midline  Assessment: Doing well status post recent carotid endarterectomy. Patient will follow-up in 9 months with repeat carotid duplex exam and continue his antiplatelet agent.  Plan: See above  [name], MD [facility] [place] Office: [phone] Pager: [phone]

## 2015-11-09 ENCOUNTER — Telehealth: Payer: Self-pay

## 2015-11-09 NOTE — Telephone Encounter (Signed)
Phone call from pt.  Reported he has a stitch right neck that is visible through the incision.  Stated he tried to lightly pull on it, and met resistance. Reported the incision is intact.  Denied any redness, drainage, or irritation.  Questioned if he can clip this?  Advised that appt. will be made to have incision checked.  Agreed.

## 2015-11-10 ENCOUNTER — Encounter: Payer: Self-pay | Admitting: Vascular Surgery

## 2015-11-11 ENCOUNTER — Ambulatory Visit (INDEPENDENT_AMBULATORY_CARE_PROVIDER_SITE_OTHER): Payer: Medicare Other | Admitting: Vascular Surgery

## 2015-11-11 ENCOUNTER — Encounter: Payer: Self-pay | Admitting: Vascular Surgery

## 2015-11-11 VITALS — BP 140/65 | HR 46 | Temp 97.7°F | Resp 16 | Ht 69.0 in | Wt 198.0 lb

## 2015-11-11 DIAGNOSIS — I6521 Occlusion and stenosis of right carotid artery: Secondary | ICD-10-CM

## 2015-11-11 NOTE — Progress Notes (Signed)
Patient is 75 year old male who returns after recent carotid endarterectomy. He complains of a stitch at the upper portion of his incision.  Physical exam:  Filed Vitals:   11/11/15 0837 11/11/15 0838  BP: 138/66 140/65  Pulse: 46 46  Temp: 97.7 F (36.5 C)   Resp: 16   Height: 5\' 9"  (1.753 m)   Weight: 198 lb (89.812 kg)   SpO2: 97%     Right neck incision healing well no drainage no erythema area of concern is a small centimeter piece of suture that was clipped off today.  Assessment: Doing well status post carotid endarterectomy  Plan: Follow-up 6 months carotid duplex exam  [name], MD [facility] [place] Office: [phone] Pager: [phone]

## 2015-11-23 DIAGNOSIS — Z79891 Long term (current) use of opiate analgesic: Secondary | ICD-10-CM | POA: Diagnosis not present

## 2015-11-23 DIAGNOSIS — M5137 Other intervertebral disc degeneration, lumbosacral region: Secondary | ICD-10-CM | POA: Diagnosis not present

## 2015-11-23 DIAGNOSIS — M47817 Spondylosis without myelopathy or radiculopathy, lumbosacral region: Secondary | ICD-10-CM | POA: Diagnosis not present

## 2015-11-23 DIAGNOSIS — Z79899 Other long term (current) drug therapy: Secondary | ICD-10-CM | POA: Diagnosis not present

## 2015-11-23 DIAGNOSIS — M25569 Pain in unspecified knee: Secondary | ICD-10-CM | POA: Diagnosis not present

## 2015-11-23 DIAGNOSIS — G894 Chronic pain syndrome: Secondary | ICD-10-CM | POA: Diagnosis not present

## 2015-11-24 DIAGNOSIS — I10 Essential (primary) hypertension: Secondary | ICD-10-CM | POA: Diagnosis not present

## 2015-11-24 DIAGNOSIS — N183 Chronic kidney disease, stage 3 (moderate): Secondary | ICD-10-CM | POA: Diagnosis not present

## 2015-11-24 DIAGNOSIS — R5383 Other fatigue: Secondary | ICD-10-CM | POA: Diagnosis not present

## 2015-11-24 DIAGNOSIS — E785 Hyperlipidemia, unspecified: Secondary | ICD-10-CM | POA: Diagnosis not present

## 2015-12-04 DIAGNOSIS — M545 Low back pain: Secondary | ICD-10-CM | POA: Diagnosis not present

## 2015-12-04 DIAGNOSIS — M5137 Other intervertebral disc degeneration, lumbosacral region: Secondary | ICD-10-CM | POA: Diagnosis not present

## 2015-12-04 DIAGNOSIS — M1288 Other specific arthropathies, not elsewhere classified, other specified site: Secondary | ICD-10-CM | POA: Diagnosis not present

## 2015-12-04 DIAGNOSIS — M4696 Unspecified inflammatory spondylopathy, lumbar region: Secondary | ICD-10-CM | POA: Diagnosis not present

## 2015-12-09 DIAGNOSIS — M25569 Pain in unspecified knee: Secondary | ICD-10-CM | POA: Diagnosis not present

## 2015-12-09 DIAGNOSIS — M179 Osteoarthritis of knee, unspecified: Secondary | ICD-10-CM | POA: Diagnosis not present

## 2015-12-19 ENCOUNTER — Other Ambulatory Visit: Payer: Self-pay | Admitting: Family Medicine

## 2015-12-21 ENCOUNTER — Other Ambulatory Visit (INDEPENDENT_AMBULATORY_CARE_PROVIDER_SITE_OTHER): Payer: Medicare Other

## 2015-12-21 DIAGNOSIS — Z0000 Encounter for general adult medical examination without abnormal findings: Secondary | ICD-10-CM

## 2015-12-21 DIAGNOSIS — Z125 Encounter for screening for malignant neoplasm of prostate: Secondary | ICD-10-CM | POA: Diagnosis not present

## 2015-12-21 LAB — HEPATIC FUNCTION PANEL
ALK PHOS: 30 U/L — AB (ref 39–117)
ALT: 23 U/L (ref 0–53)
AST: 22 U/L (ref 0–37)
Albumin: 4.1 g/dL (ref 3.5–5.2)
BILIRUBIN DIRECT: 0.1 mg/dL (ref 0.0–0.3)
BILIRUBIN TOTAL: 0.4 mg/dL (ref 0.2–1.2)
Total Protein: 6.6 g/dL (ref 6.0–8.3)

## 2015-12-21 LAB — POC URINALSYSI DIPSTICK (AUTOMATED)
BILIRUBIN UA: NEGATIVE
Glucose, UA: NEGATIVE
KETONES UA: NEGATIVE
Leukocytes, UA: NEGATIVE
NITRITE UA: NEGATIVE
PH UA: 5
PROTEIN UA: NEGATIVE
RBC UA: NEGATIVE
Spec Grav, UA: 1.01
Urobilinogen, UA: 0.2

## 2015-12-21 LAB — LIPID PANEL
CHOL/HDL RATIO: 3
Cholesterol: 136 mg/dL (ref 0–200)
HDL: 46.3 mg/dL (ref >39.00)
LDL CALC: 73 mg/dL (ref 0–99)
NonHDL: 89.67
TRIGLYCERIDES: 82 mg/dL (ref 0.0–149.0)
VLDL: 16.4 mg/dL (ref 0.0–40.0)

## 2015-12-21 LAB — CBC WITH DIFFERENTIAL/PLATELET
BASOS ABS: 0 10*3/uL (ref 0.0–0.1)
BASOS PCT: 0.3 % (ref 0.0–3.0)
EOS ABS: 0.2 10*3/uL (ref 0.0–0.7)
Eosinophils Relative: 2.5 % (ref 0.0–5.0)
HCT: 35.9 % — ABNORMAL LOW (ref 39.0–52.0)
Hemoglobin: 11.7 g/dL — ABNORMAL LOW (ref 13.0–17.0)
Lymphocytes Relative: 22 % (ref 12.0–46.0)
Lymphs Abs: 2.2 10*3/uL (ref 0.7–4.0)
MCHC: 32.7 g/dL (ref 30.0–36.0)
MCV: 90 fl (ref 78.0–100.0)
MONO ABS: 0.6 10*3/uL (ref 0.1–1.0)
Monocytes Relative: 6.4 % (ref 3.0–12.0)
Neutro Abs: 6.9 10*3/uL (ref 1.4–7.7)
Neutrophils Relative %: 68.8 % (ref 43.0–77.0)
Platelets: 191 10*3/uL (ref 150.0–400.0)
RBC: 3.99 Mil/uL — AB (ref 4.22–5.81)
RDW: 14.2 % (ref 11.5–15.5)
WBC: 10 10*3/uL (ref 4.0–10.5)

## 2015-12-21 LAB — PSA: PSA: 1.59 ng/mL (ref 0.10–4.00)

## 2015-12-21 LAB — TSH: TSH: 1.77 u[IU]/mL (ref 0.35–4.50)

## 2015-12-21 LAB — BASIC METABOLIC PANEL
BUN: 35 mg/dL — AB (ref 6–23)
CALCIUM: 9.1 mg/dL (ref 8.4–10.5)
CO2: 28 mEq/L (ref 19–32)
CREATININE: 2.16 mg/dL — AB (ref 0.40–1.50)
Chloride: 104 mEq/L (ref 96–112)
GFR: 31.66 mL/min — AB (ref >60.00)
Glucose, Bld: 98 mg/dL (ref 70–99)
Potassium: 4.8 mEq/L (ref 3.5–5.1)
Sodium: 140 mEq/L (ref 135–145)

## 2015-12-22 DIAGNOSIS — Z79899 Other long term (current) drug therapy: Secondary | ICD-10-CM | POA: Diagnosis not present

## 2015-12-22 DIAGNOSIS — G894 Chronic pain syndrome: Secondary | ICD-10-CM | POA: Diagnosis not present

## 2015-12-22 DIAGNOSIS — Z79891 Long term (current) use of opiate analgesic: Secondary | ICD-10-CM | POA: Diagnosis not present

## 2015-12-22 DIAGNOSIS — M47817 Spondylosis without myelopathy or radiculopathy, lumbosacral region: Secondary | ICD-10-CM | POA: Diagnosis not present

## 2015-12-22 DIAGNOSIS — M4696 Unspecified inflammatory spondylopathy, lumbar region: Secondary | ICD-10-CM | POA: Diagnosis not present

## 2015-12-22 DIAGNOSIS — M5137 Other intervertebral disc degeneration, lumbosacral region: Secondary | ICD-10-CM | POA: Diagnosis not present

## 2015-12-25 ENCOUNTER — Other Ambulatory Visit: Payer: Self-pay

## 2015-12-25 ENCOUNTER — Encounter: Payer: Medicare Other | Admitting: Family Medicine

## 2015-12-25 ENCOUNTER — Telehealth: Payer: Self-pay

## 2015-12-25 MED ORDER — BISOPROLOL FUMARATE 10 MG PO TABS: 10.0000 mg | ORAL_TABLET | Freq: Every day | ORAL | 3 refills | Status: DC

## 2015-12-25 NOTE — Telephone Encounter (Signed)
Pt would like a refill of his diazepam. OK to refill?

## 2015-12-29 ENCOUNTER — Telehealth: Payer: Self-pay | Admitting: Family Medicine

## 2015-12-29 NOTE — Telephone Encounter (Signed)
Pt req refill pn the following meds  Diazepam, Tizanidine     Pharmacy   [facility]   And need 90 day supply

## 2015-12-29 NOTE — Telephone Encounter (Signed)
Yes thanks, lets change to every 8 hours

## 2015-12-30 ENCOUNTER — Other Ambulatory Visit: Payer: Self-pay

## 2015-12-30 MED ORDER — DIAZEPAM 5 MG PO TABS: 2.5000 mg | ORAL_TABLET | Freq: Four times a day (QID) | ORAL | 2 refills | Status: DC | PRN

## 2015-12-30 MED ORDER — DIAZEPAM 5 MG PO TABS: 2.5000 mg | ORAL_TABLET | Freq: Three times a day (TID) | ORAL | 2 refills | Status: DC | PRN

## 2015-12-30 MED ORDER — TIZANIDINE HCL 4 MG PO TABS: ORAL_TABLET | ORAL | 1 refills | Status: DC

## 2015-12-30 NOTE — Telephone Encounter (Signed)
Called patient and confirmed that he would like both prescriptions sent to his mail order pharmacy. Diazepam called to pharmacy with change to every 8 hours PRN. Patient is also aware of this change.

## 2015-12-30 NOTE — Telephone Encounter (Signed)
Yes thanks, my apologies- may fill tizanidine

## 2015-12-31 ENCOUNTER — Other Ambulatory Visit: Payer: Self-pay

## 2015-12-31 DIAGNOSIS — M4696 Unspecified inflammatory spondylopathy, lumbar region: Secondary | ICD-10-CM | POA: Diagnosis not present

## 2015-12-31 DIAGNOSIS — M47817 Spondylosis without myelopathy or radiculopathy, lumbosacral region: Secondary | ICD-10-CM | POA: Diagnosis not present

## 2015-12-31 DIAGNOSIS — G894 Chronic pain syndrome: Secondary | ICD-10-CM | POA: Diagnosis not present

## 2015-12-31 DIAGNOSIS — M5137 Other intervertebral disc degeneration, lumbosacral region: Secondary | ICD-10-CM | POA: Diagnosis not present

## 2016-01-08 ENCOUNTER — Ambulatory Visit (INDEPENDENT_AMBULATORY_CARE_PROVIDER_SITE_OTHER): Payer: Medicare Other | Admitting: Family Medicine

## 2016-01-08 ENCOUNTER — Encounter: Payer: Self-pay | Admitting: Family Medicine

## 2016-01-08 VITALS — BP 132/82 | HR 52 | Temp 97.4°F | Ht 67.0 in | Wt 196.4 lb

## 2016-01-08 DIAGNOSIS — I6523 Occlusion and stenosis of bilateral carotid arteries: Secondary | ICD-10-CM

## 2016-01-08 DIAGNOSIS — I5032 Chronic diastolic (congestive) heart failure: Secondary | ICD-10-CM | POA: Diagnosis not present

## 2016-01-08 DIAGNOSIS — E785 Hyperlipidemia, unspecified: Secondary | ICD-10-CM

## 2016-01-08 DIAGNOSIS — I739 Peripheral vascular disease, unspecified: Secondary | ICD-10-CM | POA: Insufficient documentation

## 2016-01-08 DIAGNOSIS — Z0000 Encounter for general adult medical examination without abnormal findings: Secondary | ICD-10-CM | POA: Diagnosis not present

## 2016-01-08 NOTE — Progress Notes (Signed)
Phone: [phone]  Subjective:  Patient presents today for their annual physical. Chief complaint-noted.   See problem oriented charting- ROS- full  review of systems was completed and negative except for: muscle aches on statin, low back pain, knee pain, shortness of breath with more than mild activity.   The following were reviewed and entered/updated in epic: Past Medical History:  Diagnosis Date  . Arthritis    DDD- Lower Back  . Bradycardia   . CAP (community acquired pneumonia) 03/17/2015   "THAT'S WHAT THEY ARE THINKING; THEY ARE NOT SURE"  . Carotid artery occlusion   . Chronic lower back pain    DDD  . CKD (chronic kidney disease)    Dr. Corliss Parish  . Diverticulosis   . Enlarged prostate    takes Flomax daily  . History of blood transfusion    "probably when they did aortic aneurysm"  . History of colon polyps    benign  . Hyperlipidemia   . Hypertension    takes Amlodipine and Zebeta daily  . Insomnia    takes Melatonin nightly  . Joint pain   . 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     . Muscle spasm    takes Zanaflex daily as needed  . Peripheral edema    takes Furosemide daily  . Pneumonia    hx of   . Rheumatic fever 1946  . Rheumatoid arthritis ([place])   . Short-term memory loss    minimal  . Shortness of breath dyspnea    with exertion but lasts very short period of time  . TIA (transient ischemic attack)    x 2;takes Plavix daily  . Urinary frequency    takes Flomax daily  . Urinary urgency    Patient Active Problem List   Diagnosis Date Noted  . Diastolic CHF ([place]) [id number]    Priority: High  . CKD (chronic kidney disease), stage III 01/27/2014    Priority: High  . AAA (abdominal aortic aneurysm, ruptured) ([place]) 01/17/2013    Priority: High  . Carotid artery stenosis s/p L carotid endarterectomy 01/12/2012    Priority: High  . History of CVA (cerebrovascular accident) 11/17/2011    Priority: High    . Chronic pain syndrome 12/23/2009    Priority: High  . Personal history of colonic polyps 05/15/2013    Priority: Medium  . Essential tremor 10/05/2009    Priority: Medium  . UNSPECIFIED ANEMIA 12/05/2008    Priority: Medium  . BPH (benign prostatic hyperplasia) 06/04/2007    Priority: Medium  . Fasting hyperglycemia 02/05/2007    Priority: Medium  . Hyperlipidemia 01/03/2007    Priority: Medium  . Essential hypertension 01/03/2007    Priority: Medium  . Family history of malignant neoplasm of gastrointestinal tract 05/15/2013    Priority: Low  . Inguinal hernia 05/01/2012    Priority: Low  . BURSITIS, HIP 12/21/2009    Priority: Low  . ACTINIC KERATOSIS, EAR, LEFT 03/09/2009    Priority: Low  . Osteoarthritis of left knee 11/02/2007    Priority: Low  . 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 PAD (peripheral artery disease) ([place]) 01/08/2016  . CAP (community acquired pneumonia) 03/17/2015   Past Surgical History:  Procedure Laterality Date  . ABDOMINAL AORTIC ANEURYSM REPAIR  2010  . CAROTID ENDARTERECTOMY Left 09/20/2004  . COLONOSCOPY    . ENDARTERECTOMY Right 10/12/2015   Procedure: RIGHT CAROTID ENDARTERECTOMY WITH PATCH ANGIOPLASTY;  Surgeon: Juanda Crumble  Antony Blackbird, MD;  Location: [facility];  Service: Vascular;  Laterality: Right;  . INGUINAL HERNIA REPAIR Left 02/05/2013   Procedure: HERNIA REPAIR INGUINAL ADULT;  Surgeon: Joyice Faster. Cornett, MD;  Location: [facility];  Service: General;  Laterality: Left;  . INGUINAL HERNIA REPAIR Left   . INSERTION OF MESH Left 02/05/2013   Procedure: INSERTION OF MESH;  Surgeon: Joyice Faster. Cornett, MD;  Location: [facility];  Service: General;  Laterality: Left;  . REFRACTIVE SURGERY     2016-2017  . SHOULDER ARTHROSCOPY WITH ROTATOR CUFF REPAIR AND SUBACROMIAL DECOMPRESSION Left 05/01/2014   Procedure: LEFT ARTHROSCOPY SHOULDER SUBACROMIAL DECOMPRESSION,DISTAL CLAVICAL RESECTION AND  ROTATOR CUFF REPAIR;  Surgeon: Marin Shutter, MD;  Location: [facility];  Service: Orthopedics;  Laterality: Left;  . TESTICLAR CYST EXCISION Left   . TONSILLECTOMY    . VASECTOMY      Family History  Problem Relation Age of Onset  . Parkinsonism Father   . Dementia Father   . Cancer Father     Throat  . Colon cancer Mother     died in 9s  . Cancer Mother     Colon    Medications- reviewed and updated Current Outpatient Prescriptions  Medication Sig Dispense Refill  . amLODipine (NORVASC) 10 MG tablet Take 1 tablet by mouth  daily 90 tablet 3  . atorvastatin (LIPITOR) 20 MG tablet Take 1 tablet by mouth  daily 90 tablet 3  . bisoprolol (ZEBETA) 10 MG tablet Take 1 tablet (10 mg total) by mouth daily. 90 tablet 3  . Cholecalciferol (VITAMIN D-3) 1000 UNITS CAPS Take 1 capsule by mouth daily.    . clopidogrel (PLAVIX) 75 MG tablet Take 1 tablet by mouth  daily 90 tablet 3  . diazepam (VALIUM) 5 MG tablet Take 0.5-1 tablets (2.5-5 mg total) by mouth every 8 (eight) hours as needed for muscle spasms or sedation. 40 tablet 2  . fenofibrate 160 MG tablet Take 1 tablet (160 mg total) by mouth daily. 90 tablet 3  . furosemide (LASIX) 20 MG tablet Take 1 tablet (20 mg total) by mouth every other day. 90 tablet 3  . gabapentin (NEURONTIN) 300 MG capsule Take 1 capsule by mouth two times daily 180 capsule 1  . levofloxacin (LEVAQUIN) 750 MG tablet Take 1 tablet (750 mg total) by mouth every other day. 4 tablet 0  . Melatonin 3 MG TABS Take 5 mg by mouth at bedtime.     . methocarbamol (ROBAXIN) 500 MG tablet Take 500 mg by mouth 2 (two) times daily.    Marland Kitchen oxyCODONE-acetaminophen (PERCOCET/ROXICET) 5-325 MG tablet Take 1 tablet by mouth every 8 (eight) hours as needed for moderate pain. 6 tablet 0  . tamsulosin (FLOMAX) 0.4 MG CAPS capsule Take 1 capsule by mouth  daily 90 capsule 3  . tiZANidine (ZANAFLEX) 4 MG tablet Take 1 tablet by mouth  every 6 hours as needed for muscle spasms 90 tablet 1   . vitamin B-12 (CYANOCOBALAMIN) 1000 MCG tablet Take 1,000 mcg by mouth daily.      Allergies-reviewed and updated No Known Allergies  Social History   Social History  . Marital status: Married    Spouse name: N/A  . Number of children: N/A  . Years of education: N/A   Social History Main Topics  . Smoking status: Former Smoker    Packs/day: 2.00    Years: 28.00    Types: Cigarettes  . Smokeless tobacco: Never  Used     Comment: quit smoking "in my 89's"  . Alcohol use No  . Drug use: No     Comment: hx of-4-5 yrs ago  . Sexual activity: Not Currently   Other Topics Concern  . None   Social History Narrative   Married 33 years in 2015. Lived together for 12 years. 2 boys from first wife. 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       Hobbies: yardwork, [place] barn, woodwork, Orthoptist    Objective: BP 132/82 (BP Location: Left Arm, Patient Position: Sitting, Cuff Size: Normal)   Pulse (!) 52   Temp 97.4 F (36.3 C) (Oral)   Ht 5\' 7"  (1.702 m)   Wt 196 lb 6.4 oz (89.1 kg)   SpO2 98%   BMI 30.76 kg/m  Gen: NAD, resting comfortably HEENT: Mucous membranes are moist. Oropharynx normal, dentures noted Neck: no thyromegaly CV: RRR no murmurs rubs or gallops Lungs: CTAB no crackles, wheeze, rhonchi Abdomen: soft/nontender/nondistended/normal bowel sounds. No rebound or guarding.  Declined rectal Ext: trace edema Skin: warm, dry Neuro: grossly normal, moves all extremities, PERRLA  Assessment/Plan:  75 y.o. male presenting for annual physical.  Health Maintenance counseling: 1. Anticipatory guidance: Patient counseled regarding regular dental exams (dentures- does not see), eye exams, wearing seatbelts.  2. Risk factor reduction:  Advised patient of need for regular exercise and diet rich and fruits and vegetables to reduce risk of heart  attack and stroke. Weight stable.  3. Immunizations/screenings/ancillary studies- can get flu shot when available but has declined in past- declines again Immunization History  Administered Date(s) Administered  . Influenza Whole 05/30/1997  . Pneumococcal Conjugate-13 12/22/2014  . Pneumococcal Polysaccharide-23 03/28/2013  . Td 05/30/1994, 02/05/2007   4. Prostate cancer screening- advised against further PSA testing, unclear why ordered but is low risk based on trend- declined rectal   Lab Results  Component Value Date   PSA 1.59 12/21/2015   PSA 1.28 12/26/2012   PSA 0.84 09/26/2011   5. Colon cancer screening - 06/20/13 with 5 year repeat noted, polyps 200 and 2007  Status of chronic or acute concerns  Carotid Stenosis- s/p carotid endarterectomy. Follows with Dr. Oneida Alar. History AAA s/p repair 2010  PAD- Follows with Dr. Oneida Alar. ABI 0.67 to 0.8 and tests yearly  Hx CVA- on plavix lipitor- no residual deficiet  CKD III - GFR slightly worsened but remains above 30. Continues follow up with nephrology. Remain off ace with concern renal artery stenosis  Chronic pain- seeing pain management.   Essential tremor- given issues with baclofen suspect primodone would also cause confusion so hold off. Discussed can drink light amount of alcohol if at home to help with tremor.   Hyperglycemia- fasting sugar not elevated at this point fortunately on labs Lab Results  Component Value Date   HGBA1C 6.0 05/21/2015   HTN- controlled on amlodipine and bisoprolol  BPH- symptoms stable on flomax and saw palmetto  Diastolic CHF- weight largley stable and no increase on edema on lasix every other day. SOB with activity such as upstairs but not worsening Wt Readings from Last 3 Encounters:  01/08/16 196 lb 6.4 oz (89.1 kg)  11/11/15 198 lb (89.8 kg)  10/29/15 198 lb 1.6 oz (89.9 kg)   Hyperlipidemia discussed LDL goal under 70, increase atorvastatin from 20mg  to  40mg  planned but having  myalgias. Muscle cramps in arms and legs on statin- do not think we can increase statin. Was placed on baclofen per [name]- felt off balance, mild confusion on this so came off. Trial coenzyme q10  4-6 month follow up  Return precautions advised.   Garret Reddish, MD

## 2016-01-08 NOTE — Patient Instructions (Addendum)
Trial coenzyme q10- likely find at [facility]. THis is an effort to help with muscle aches and cramps on cholesterol medicine  Discussed can drink light amount of alcohol if at home to help with tremor. Already on beta blocker which likely helps some  4-6 month follow up

## 2016-01-08 NOTE — Progress Notes (Signed)
Pre visit review using our clinic review tool, if applicable. No additional management support is needed unless otherwise documented below in the visit note. 

## 2016-01-08 NOTE — Assessment & Plan Note (Signed)
discussed LDL goal under 70, increase atorvastatin from 20mg  to 40mg  planned but having myalgias. Muscle cramps in arms and legs on statin- do not think we can increase statin. Was placed on baclofen per [name]- felt off balance, mild confusion on this so came off. Trial coenzyme q10

## 2016-01-19 DIAGNOSIS — M4696 Unspecified inflammatory spondylopathy, lumbar region: Secondary | ICD-10-CM | POA: Diagnosis not present

## 2016-01-19 DIAGNOSIS — Z79891 Long term (current) use of opiate analgesic: Secondary | ICD-10-CM | POA: Diagnosis not present

## 2016-01-19 DIAGNOSIS — E669 Obesity, unspecified: Secondary | ICD-10-CM | POA: Diagnosis not present

## 2016-01-19 DIAGNOSIS — M47817 Spondylosis without myelopathy or radiculopathy, lumbosacral region: Secondary | ICD-10-CM | POA: Diagnosis not present

## 2016-01-19 DIAGNOSIS — Z79899 Other long term (current) drug therapy: Secondary | ICD-10-CM | POA: Diagnosis not present

## 2016-01-19 DIAGNOSIS — G894 Chronic pain syndrome: Secondary | ICD-10-CM | POA: Diagnosis not present

## 2016-01-19 DIAGNOSIS — M5137 Other intervertebral disc degeneration, lumbosacral region: Secondary | ICD-10-CM | POA: Diagnosis not present

## 2016-01-28 DIAGNOSIS — H524 Presbyopia: Secondary | ICD-10-CM | POA: Diagnosis not present

## 2016-02-16 DIAGNOSIS — G894 Chronic pain syndrome: Secondary | ICD-10-CM | POA: Diagnosis not present

## 2016-02-16 DIAGNOSIS — M5137 Other intervertebral disc degeneration, lumbosacral region: Secondary | ICD-10-CM | POA: Diagnosis not present

## 2016-02-16 DIAGNOSIS — M47817 Spondylosis without myelopathy or radiculopathy, lumbosacral region: Secondary | ICD-10-CM | POA: Diagnosis not present

## 2016-02-16 DIAGNOSIS — Z79891 Long term (current) use of opiate analgesic: Secondary | ICD-10-CM | POA: Diagnosis not present

## 2016-02-16 DIAGNOSIS — M4696 Unspecified inflammatory spondylopathy, lumbar region: Secondary | ICD-10-CM | POA: Diagnosis not present

## 2016-02-16 DIAGNOSIS — Z79899 Other long term (current) drug therapy: Secondary | ICD-10-CM | POA: Diagnosis not present

## 2016-03-10 ENCOUNTER — Ambulatory Visit (INDEPENDENT_AMBULATORY_CARE_PROVIDER_SITE_OTHER): Payer: Medicare Other | Admitting: Family Medicine

## 2016-03-10 ENCOUNTER — Encounter: Payer: Self-pay | Admitting: Family Medicine

## 2016-03-10 VITALS — BP 130/68 | HR 69 | Temp 97.6°F | Wt 197.6 lb

## 2016-03-10 DIAGNOSIS — M545 Low back pain, unspecified: Secondary | ICD-10-CM

## 2016-03-10 NOTE — Patient Instructions (Signed)
Ice left low back 20 minutes 3-4x a day for 3 days then switch to heat  Can use a valium in the daytime as well if not driving  Should get back to normal pain level in 4-6 weeks, see [place] if not better in that timeframe or sooner if worsens or other new symptoms such as fecal or urinary incontinence or leg weakness

## 2016-03-10 NOTE — Progress Notes (Signed)
Subjective:  Kevin [name] is a 75 y.o. year old very pleasant male patient who presents for/with See problem oriented charting ROS-No saddle anesthesia, bladder incontinence, fecal incontinence, weakness in extremity, numbness or tingling in extremity. History negative for trauma.see any ROS included in HPI as well.   Past Medical History-  Patient Active Problem List   Diagnosis Date Noted  . Diastolic CHF ([place]) 17/51/0258    Priority: High  . CKD (chronic kidney disease), stage III 01/27/2014    Priority: High  . AAA (abdominal aortic aneurysm, ruptured) ([place]) 01/17/2013    Priority: High  . Carotid artery stenosis s/p L carotid endarterectomy 01/12/2012    Priority: High  . History of CVA (cerebrovascular accident) 11/17/2011    Priority: High  . Chronic pain syndrome 12/23/2009    Priority: High  . Personal history of colonic polyps 05/15/2013    Priority: Medium  . Essential tremor 10/05/2009    Priority: Medium  . UNSPECIFIED ANEMIA 12/05/2008    Priority: Medium  . BPH (benign prostatic hyperplasia) 06/04/2007    Priority: Medium  . Fasting hyperglycemia 02/05/2007    Priority: Medium  . Hyperlipidemia 01/03/2007    Priority: Medium  . Essential hypertension 01/03/2007    Priority: Medium  . Family history of malignant neoplasm of gastrointestinal tract 05/15/2013    Priority: Low  . Inguinal hernia 05/01/2012    Priority: Low  . BURSITIS, HIP 12/21/2009    Priority: Low  . ACTINIC KERATOSIS, EAR, LEFT 03/09/2009    Priority: Low  . Osteoarthritis of left knee 11/02/2007    Priority: Low  . 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 PAD (peripheral artery disease) ([place]) 01/08/2016  . CAP (community acquired pneumonia) 03/17/2015    Medications- reviewed and updated Current Outpatient Prescriptions  Medication Sig Dispense Refill  . amLODipine (NORVASC) 10 MG tablet Take 1 tablet by mouth  daily 90 tablet 3  . atorvastatin (LIPITOR) 20  MG tablet Take 1 tablet by mouth  daily 90 tablet 3  . bisoprolol (ZEBETA) 10 MG tablet Take 1 tablet (10 mg total) by mouth daily. 90 tablet 3  . Cholecalciferol (VITAMIN D-3) 1000 UNITS CAPS Take 1 capsule by mouth daily.    . clopidogrel (PLAVIX) 75 MG tablet Take 1 tablet by mouth  daily 90 tablet 3  . diazepam (VALIUM) 5 MG tablet Take 0.5-1 tablets (2.5-5 mg total) by mouth every 8 (eight) hours as needed for muscle spasms or sedation. 40 tablet 2  . fenofibrate 160 MG tablet Take 1 tablet (160 mg total) by mouth daily. 90 tablet 3  . furosemide (LASIX) 20 MG tablet Take 1 tablet (20 mg total) by mouth every other day. 90 tablet 3  . gabapentin (NEURONTIN) 300 MG capsule Take 1 capsule by mouth two times daily 180 capsule 1  . Melatonin 3 MG TABS Take 5 mg by mouth at bedtime.     Marland Kitchen oxyCODONE-acetaminophen (PERCOCET/ROXICET) 5-325 MG tablet Take 1 tablet by mouth every 8 (eight) hours as needed for moderate pain. 6 tablet 0  . tamsulosin (FLOMAX) 0.4 MG CAPS capsule Take 1 capsule by mouth  daily 90 capsule 3  . tiZANidine (ZANAFLEX) 4 MG tablet Take 1 tablet by mouth  every 6 hours as needed for muscle spasms 90 tablet 1  . vitamin B-12 (CYANOCOBALAMIN) 1000 MCG tablet Take 1,000 mcg by mouth daily.     No current facility-administered medications for this visit.  Objective: BP 130/68 (BP Location: Left Arm, Patient Position: Sitting, Cuff Size: Large)   Pulse 69   Temp 97.6 F (36.4 C) (Oral)   Wt 197 lb 9.6 oz (89.6 kg)   SpO2 94%   BMI 30.95 kg/m  Gen: NAD, resting comfortably CV: RRR no murmurs rubs or gallops Lungs: CTAB no crackles, wheeze, rhonchi Abdomen: soft/nontender/nondistended/normal bowel sounds. No rebound or guarding.  Back: muscle spasm left low back- no significant midline pain by comparison but he states always mildly tender with palpation throughout spinal cord  Assessment/Plan:  Acute left-sided low back pain without sciatica S: on Tuesday bent over  and had intense left low back pain, then again last night was trying to get cat food and bent over deeply and had immediate severe tearing pain in left low back. Not worst pain ever but close to other times he has pulled a back muscle. 9.5/10 aching pain- has happened in the past as well he states 9-10 times in life. He is taking his medication from pain medicine but wonders about muscle relaxer- has valium but has only used at night nad helps A/P: Patient believes he has torn a muscle- appears to me to have muscle spasm rather severe.  discussed short term may use valium in daytime as well as night. Continue regular pain medicines from pain clinic. Add ice for 3 days then heat. No red flags but return precautions advised.  Return precautions advised.  Garret Reddish, MD

## 2016-03-10 NOTE — Progress Notes (Signed)
Pre visit review using our clinic review tool, if applicable. No additional management support is needed unless otherwise documented below in the visit note. 

## 2016-03-22 NOTE — Progress Notes (Signed)
Cardiology Office Note   Date:  03/23/2016   ID:  Kevin [name], DOB 02-Feb-1941, MRN [id number]  PCP:  Garret Reddish, MD  Cardiologist:   Jarica Plass Meredith Leeds, MD    Chief Complaint  Patient presents with  . Follow-up    Essential hypertension     History of Present Illness: Kevin [name] is a 75 y.o. male who presents today for cardiology evaluation.   He has a history of open AAA repair, HLD, RA, HTN, TIA in 2013, left CEA 2006, and bradycardia.  He had a right CEA without complications. His only complaint today is dizziness on standing. He says that this happens occasionally. Otherwise he feels well.   Today, he denies symptoms of palpitations, chest pain, orthopnea, PND, lower extremity edema, claudication, dizziness, presyncope, syncope, bleeding, or neurologic sequela. The patient is tolerating medications without difficulties and is otherwise without complaint today.    Past Medical History:  Diagnosis Date  . Arthritis    DDD- Lower Back  . Bradycardia   . CAP (community acquired pneumonia) 03/17/2015   "THAT'S WHAT THEY ARE THINKING; THEY ARE NOT SURE"  . Carotid artery occlusion   . Chronic lower back pain    DDD  . CKD (chronic kidney disease)    Dr. Corliss Parish  . Diverticulosis   . Enlarged prostate    takes Flomax daily  . History of blood transfusion    "probably when they did aortic aneurysm"  . History of colon polyps    benign  . Hyperlipidemia   . Hypertension    takes Amlodipine and Zebeta daily  . Insomnia    takes Melatonin nightly  . Joint pain   . 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     . Muscle spasm    takes Zanaflex daily as needed  . Peripheral edema    takes Furosemide daily  . Pneumonia    hx of   . Rheumatic fever 1946  . Rheumatoid arthritis ([place])   . Short-term memory loss    minimal  . Shortness of breath dyspnea    with exertion but lasts very short period of time  . TIA (transient  ischemic attack)    x 2;takes Plavix daily  . Urinary frequency    takes Flomax daily  . Urinary urgency    Past Surgical History:  Procedure Laterality Date  . ABDOMINAL AORTIC ANEURYSM REPAIR  2010  . CAROTID ENDARTERECTOMY Left 09/20/2004  . COLONOSCOPY    . ENDARTERECTOMY Right 10/12/2015   Procedure: RIGHT CAROTID ENDARTERECTOMY WITH PATCH ANGIOPLASTY;  Surgeon: Elam Dutch, MD;  Location: [facility];  Service: Vascular;  Laterality: Right;  . INGUINAL HERNIA REPAIR Left 02/05/2013   Procedure: HERNIA REPAIR INGUINAL ADULT;  Surgeon: Joyice Faster. Cornett, MD;  Location: [facility];  Service: General;  Laterality: Left;  . INGUINAL HERNIA REPAIR Left   . INSERTION OF MESH Left 02/05/2013   Procedure: INSERTION OF MESH;  Surgeon: Joyice Faster. Cornett, MD;  Location: [facility];  Service: General;  Laterality: Left;  . REFRACTIVE SURGERY     2016-2017  . SHOULDER ARTHROSCOPY WITH ROTATOR CUFF REPAIR AND SUBACROMIAL DECOMPRESSION Left 05/01/2014   Procedure: LEFT ARTHROSCOPY SHOULDER SUBACROMIAL DECOMPRESSION,DISTAL CLAVICAL RESECTION AND ROTATOR CUFF REPAIR;  Surgeon: Marin Shutter, MD;  Location: [facility];  Service: Orthopedics;  Laterality: Left;  . TESTICLAR CYST EXCISION Left   . TONSILLECTOMY    . VASECTOMY  Current Outpatient Prescriptions  Medication Sig Dispense Refill  . amLODipine (NORVASC) 10 MG tablet Take 1 tablet by mouth  daily 90 tablet 3  . atorvastatin (LIPITOR) 20 MG tablet Take 1 tablet by mouth  daily 90 tablet 3  . bisoprolol (ZEBETA) 10 MG tablet Take 1 tablet (10 mg total) by mouth daily. 90 tablet 3  . Cholecalciferol (VITAMIN D-3) 1000 UNITS CAPS Take 1 capsule by mouth daily.    . clopidogrel (PLAVIX) 75 MG tablet Take 1 tablet by mouth  daily 90 tablet 3  . diazepam (VALIUM) 5 MG tablet Take 0.5-1 tablets (2.5-5 mg total) by mouth every 8 (eight) hours as needed for muscle spasms or sedation. 40 tablet 2  . fenofibrate 160 MG  tablet Take 1 tablet (160 mg total) by mouth daily. 90 tablet 3  . furosemide (LASIX) 20 MG tablet Take 1 tablet (20 mg total) by mouth every other day. 90 tablet 3  . gabapentin (NEURONTIN) 300 MG capsule Take 1 capsule by mouth two times daily 180 capsule 1  . Melatonin 3 MG TABS Take 5 mg by mouth at bedtime.     Marland Kitchen oxyCODONE-acetaminophen (PERCOCET/ROXICET) 5-325 MG tablet Take 1 tablet by mouth every 8 (eight) hours as needed for moderate pain. 6 tablet 0  . tamsulosin (FLOMAX) 0.4 MG CAPS capsule Take 1 capsule by mouth  daily 90 capsule 3  . tiZANidine (ZANAFLEX) 4 MG tablet Take 1 tablet by mouth  every 6 hours as needed for muscle spasms 90 tablet 1  . vitamin B-12 (CYANOCOBALAMIN) 1000 MCG tablet Take 1,000 mcg by mouth daily.     No current facility-administered medications for this visit.     Allergies:   Review of patient's allergies indicates no known allergies.   Social History:  The patient  reports that he has quit smoking. His smoking use included Cigarettes. He has a 56.00 pack-year smoking history. He has never used smokeless tobacco. He reports that he does not drink alcohol or use drugs.   Family History:  The patient's family history includes Cancer in his father and mother; Colon cancer in his mother; Dementia in his father; Parkinsonism in his father.    ROS:  Please see the history of present illness.   Otherwise, review of systems is positive for visual changes, back pain, muscle pain, difficulty urinating.   All other systems are reviewed and negative.    PHYSICAL EXAM: VS:  BP 138/70   Pulse (!) 54   Ht 5\' 7"  (1.702 m)   Wt 198 lb 12.8 oz (90.2 kg)   BMI 31.14 kg/m  , BMI Body mass index is 31.14 kg/m. GEN: Well nourished, well developed, in no acute distress  HEENT: normal  Neck: no JVD, carotid bruits, or masses Cardiac: RRR; no murmurs, rubs, or gallops,no edema  Respiratory:  clear to auscultation bilaterally, normal work of breathing GI: soft,  nontender, nondistended, + BS MS: no deformity or atrophy  Skin: warm and dry Neuro:  Strength and sensation are intact Psych: euthymic mood, full affect  EKG:  EKG is ordered today. Personal review of the ekg ordered 09/22/15 shows sinus bradycardia, rate 55   Recent Labs: 12/21/2015: ALT 23; BUN 35; Creatinine, Ser 2.16; Hemoglobin 11.7; Platelets 191.0; Potassium 4.8; Sodium 140; TSH 1.77    Lipid Panel     Component Value Date/Time   CHOL 136 12/21/2015 0830   TRIG 82.0 12/21/2015 0830   HDL 46.30 12/21/2015 0830   CHOLHDL 3  12/21/2015 0830   VLDL 16.4 12/21/2015 0830   LDLCALC 73 12/21/2015 0830   LDLDIRECT 134.9 12/26/2012 1201     Wt Readings from Last 3 Encounters:  03/23/16 198 lb 12.8 oz (90.2 kg)  03/10/16 197 lb 9.6 oz (89.6 kg)  01/08/16 196 lb 6.4 oz (89.1 kg)      Other studies Reviewed: Additional studies/ records that were reviewed today include: TTE 05/2015  Review of the above records today demonstrates:  - Normal LV size with mild LV hypertrophy. EF 55-60%. Normal RV size and systolic function. Aortic sclerosis without significant stenosis.  Myoview 09/29/15  Nuclear stress EF: 54%.  There was no ST segment deviation noted during stress.  No T wave inversion was noted during stress.  Defect 1: There is a small defect of mild severity present in the mid inferolateral and apical lateral location.  Findings consistent with mild ischemia.  This is a low risk study.  The left ventricular ejection fraction is mildly decreased (45-54%).   ASSESSMENT AND PLAN:  1.  Right carotid stenosis: Had CEA performed with good results.  2. Hypertension:: on norvasc, bisoprolol, lasix.  Currently well controlled  3. Hyperlipidemia: currently on lipitor.    4. Dizziness: Likely some component of orthostasis as he is bradycardic and has carotid vascular disease. I told him to be aware that he could become dizzy and take some time to gather himself if he  becomes dizzy when he stands.  Current medicines are reviewed at length with the patient today.   The patient does not have concerns regarding his medicines.  The following changes were made today:  none  Labs/ tests ordered today include:  No orders of the defined types were placed in this encounter.    Disposition:   FU with Bryleigh Ottaway PRN  Signed, Zavier Canela Meredith Leeds, MD  03/23/2016 11:24 AM     [facility] [address] [place] [address] [phone] (office) [phone] (fax)

## 2016-03-23 ENCOUNTER — Ambulatory Visit (INDEPENDENT_AMBULATORY_CARE_PROVIDER_SITE_OTHER): Payer: Medicare Other | Admitting: Cardiology

## 2016-03-23 ENCOUNTER — Encounter: Payer: Self-pay | Admitting: Cardiology

## 2016-03-23 VITALS — BP 138/70 | HR 54 | Ht 67.0 in | Wt 198.8 lb

## 2016-03-23 DIAGNOSIS — I10 Essential (primary) hypertension: Secondary | ICD-10-CM

## 2016-03-23 NOTE — Patient Instructions (Signed)
Medication Instructions:  Your physician recommends that you continue on your current medications as directed. Please refer to the Current Medication list given to you today.  * If you need a refill on your cardiac medications before your next appointment, please call your pharmacy.   Labwork: None ordered  Testing/Procedures: None ordered  Follow-Up: No follow up is needed at this time with Dr. Camnitz.  He will see you on an as needed basis.   Thank you for choosing [facility]!!   Majesti Gambrell, RN [phone]     

## 2016-03-25 ENCOUNTER — Other Ambulatory Visit: Payer: Self-pay | Admitting: Family Medicine

## 2016-03-30 DIAGNOSIS — Z79891 Long term (current) use of opiate analgesic: Secondary | ICD-10-CM | POA: Diagnosis not present

## 2016-03-30 DIAGNOSIS — G894 Chronic pain syndrome: Secondary | ICD-10-CM | POA: Diagnosis not present

## 2016-03-30 DIAGNOSIS — Z79899 Other long term (current) drug therapy: Secondary | ICD-10-CM | POA: Diagnosis not present

## 2016-03-30 DIAGNOSIS — M5137 Other intervertebral disc degeneration, lumbosacral region: Secondary | ICD-10-CM | POA: Diagnosis not present

## 2016-03-30 DIAGNOSIS — M4696 Unspecified inflammatory spondylopathy, lumbar region: Secondary | ICD-10-CM | POA: Diagnosis not present

## 2016-03-30 DIAGNOSIS — M47817 Spondylosis without myelopathy or radiculopathy, lumbosacral region: Secondary | ICD-10-CM | POA: Diagnosis not present

## 2016-03-31 ENCOUNTER — Other Ambulatory Visit: Payer: Self-pay | Admitting: Family Medicine

## 2016-03-31 NOTE — Telephone Encounter (Signed)
Yes thanks 

## 2016-04-27 ENCOUNTER — Telehealth: Payer: Self-pay | Admitting: Family Medicine

## 2016-04-27 ENCOUNTER — Other Ambulatory Visit: Payer: Self-pay | Admitting: Adult Health

## 2016-04-27 ENCOUNTER — Ambulatory Visit (INDEPENDENT_AMBULATORY_CARE_PROVIDER_SITE_OTHER): Payer: Medicare Other | Admitting: Adult Health

## 2016-04-27 ENCOUNTER — Encounter: Payer: Self-pay | Admitting: Adult Health

## 2016-04-27 VITALS — BP 152/64 | Temp 98.4°F | Wt 191.9 lb

## 2016-04-27 DIAGNOSIS — R197 Diarrhea, unspecified: Secondary | ICD-10-CM | POA: Diagnosis not present

## 2016-04-27 MED ORDER — FUROSEMIDE 20 MG PO TABS: 20.0000 mg | ORAL_TABLET | ORAL | 3 refills | Status: DC

## 2016-04-27 NOTE — Telephone Encounter (Signed)
Noted  

## 2016-04-27 NOTE — Telephone Encounter (Signed)
[facility] [place] [facility] - Client [place] [facility] Patient Name: Kevin [name] DOB: 10-Jul-1940 Initial Comment Caller states he has diarrhea for 2 days, and he would like to know what to take for it. Nurse Assessment Nurse: Markus Daft, RN, Sherre Poot Date/Time Eilene Ghazi Time): 04/27/2016 9:46:57 AM Confirm and document reason for call. If symptomatic, describe symptoms. You must click the next button to save text entered. ---Caller states he has loose to watery diarrhea for 2 days, and he would like to know what to take for it? He has had 10-12 episodes in last 24 hrs. It is not a whole lot every time. He is on Percocet for pain mgmt from a spine problem. He denies feeling constipated. - No vomiting/fever. Does the patient have any new or worsening symptoms? ---Yes Will a triage be completed? ---Yes Related visit to physician within the last 2 weeks? ---No Does the PT have any chronic conditions? (i.e. diabetes, asthma, etc.) ---Yes List chronic conditions. ---HTN, chronic pain from spine problems, high cholesterol Is this a behavioral health or substance abuse call? ---No Guidelines Guideline Title Affirmed Question Affirmed Notes Diarrhea [1] SEVERE diarrhea (e.g., 7 or more times / day more than normal) AND [2] age > 60 years Final Disposition User See Physician within 4 Hours (or PCP triage) Markus Daft, RN, Sherre Poot Comments Georgina Snell Nafziger-FNP appt made at 11:30 am (no available appts with Dr. [name] today). Referrals REFERRED TO PCP OFFICE Disagree/Comply: Comply

## 2016-04-27 NOTE — Patient Instructions (Addendum)
It was great seeing you today   Your exam is consistent with a viral gastroenteritis, or stomach bug.   Make sure you are staying hydrated. You can take half a tab of imodium until you ave a formed bowel movement or take pepto bismol.  Follow up if no improvement in the next 2-3 days or sooner if needed      Viral Gastroenteritis, Adult Introduction Viral gastroenteritis is also known as the stomach flu. This condition is caused by certain germs (viruses). These germs can be passed from person to person very easily (are very contagious). This condition can cause sudden watery poop (diarrhea), fever, and throwing up (vomiting). Having watery poop and throwing up can make you feel weak and cause you to get dehydrated. Dehydration can make you tired and thirsty, make you have a dry mouth, and make it so you pee (urinate) less often. Older adults and people with other diseases or a weak defense system (immune system) are at higher risk for dehydration. It is important to replace the fluids that you lose from having watery poop and throwing up. Follow these instructions at home: Follow instructions from your doctor about how to care for yourself at home. Eating and drinking Follow these instructions as told by your doctor:  Take an oral rehydration solution (ORS). This is a drink that is sold at pharmacies and stores.  Drink clear fluids in small amounts as you are able, such as:  Water.  Ice chips.  Diluted fruit juice.  Low-calorie sports drinks.  Eat bland, easy-to-digest foods in small amounts as you are able, such as:  Bananas.  Applesauce.  Rice.  Low-fat (lean) meats.  Toast.  Crackers.  Avoid fluids that have a lot of sugar or caffeine in them.  Avoid alcohol.  Avoid spicy or fatty foods. General instructions  Drink enough fluid to keep your pee (urine) clear or pale yellow.  Wash your hands often. If you cannot use soap and water, use hand sanitizer.  Make  sure that all people in your home wash their hands well and often.  Rest at home while you get better.  Take over-the-counter and prescription medicines only as told by your doctor.  Watch your condition for any changes.  Take a warm bath to help with any burning or pain from having watery poop.  Keep all follow-up visits as told by your doctor. This is important. Contact a doctor if:  You cannot keep fluids down.  Your symptoms get worse.  You have new symptoms.  You feel light-headed or dizzy.  You have muscle cramps. Get help right away if:  You have chest pain.  You feel very weak or you pass out (faint).  You see blood in your throw-up.  Your throw-up looks like coffee grounds.  You have bloody or black poop (stools) or poop that look like tar.  You have a very bad headache, a stiff neck, or both.  You have a rash.  You have very bad pain, cramping, or bloating in your belly (abdomen).  You have trouble breathing.  You are breathing very quickly.  Your heart is beating very quickly.  Your skin feels cold and clammy.  You feel confused.  You have pain when you pee.  You have signs of dehydration, such as:  Dark pee, hardly any pee, or no pee.  Cracked lips.  Dry mouth.  Sunken eyes.  Sleepiness.  Weakness. This information is not intended to replace advice given to  you by your health care provider. Make sure you discuss any questions you have with your health care provider. Document Released: 11/02/2007 Document Revised: 12/04/2015 Document Reviewed: 01/20/2015  2017 Elsevier

## 2016-04-27 NOTE — Progress Notes (Signed)
Subjective:    Patient ID: Kevin [name], male    DOB: Mar 22, 1941, 75 y.o.   MRN: [id number]  HPI  75 year old male, patient of Dr.Hunter who presents today for two days of diarrhea. He reports having 10-12 episodes per day. He has not been on any antibiotics, has not traveled out of the country and denies any fevers. He has not noticed any abdominal pain or cramping. Denies n/v.   He feels as though today his symptoms have started to improve  He has not used any medications over the counter or started any new prescription medications   Review of Systems  Constitutional: Positive for activity change, appetite change and fatigue. Negative for chills, diaphoresis and fever.  Cardiovascular: Negative.   Gastrointestinal: Positive for diarrhea. Negative for abdominal distention, abdominal pain, nausea and vomiting.  Neurological: Negative.   All other systems reviewed and are negative.  Past Medical History:  Diagnosis Date  . Arthritis    DDD- Lower Back  . Bradycardia   . CAP (community acquired pneumonia) 03/17/2015   "THAT'S WHAT THEY ARE THINKING; THEY ARE NOT SURE"  . Carotid artery occlusion   . Chronic lower back pain    DDD  . CKD (chronic kidney disease)    Dr. Corliss Parish  . Diverticulosis   . Enlarged prostate    takes Flomax daily  . History of blood transfusion    "probably when they did aortic aneurysm"  . History of colon polyps    benign  . Hyperlipidemia   . Hypertension    takes Amlodipine and Zebeta daily  . Insomnia    takes Melatonin nightly  . Joint pain   . 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     . Muscle spasm    takes Zanaflex daily as needed  . Peripheral edema    takes Furosemide daily  . Pneumonia    hx of   . Rheumatic fever 1946  . Rheumatoid arthritis ([place])   . Short-term memory loss    minimal  . Shortness of breath dyspnea    with exertion but lasts very short period of time  . TIA (transient  ischemic attack)    x 2;takes Plavix daily  . Urinary frequency    takes Flomax daily  . Urinary urgency     Social History   Social History  . Marital status: Married    Spouse name: N/A  . Number of children: N/A  . Years of education: N/A   Occupational History  . Not on file.   Social History Main Topics  . Smoking status: Former Smoker    Packs/day: 2.00    Years: 28.00    Types: Cigarettes  . Smokeless tobacco: Never Used     Comment: quit smoking "in my 67's"  . Alcohol use No  . Drug use: No     Comment: hx of-4-5 yrs ago  . Sexual activity: Not Currently   Other Topics Concern  . Not on file   Social History Narrative   Married 33 years in 2015. Lived together for 12 years. 2 boys from first wife. 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       Hobbies: yardwork, [place] barn, woodwork, Orthoptist    Past Surgical History:  Procedure Laterality Date  .  ABDOMINAL AORTIC ANEURYSM REPAIR  2010  . CAROTID ENDARTERECTOMY Left 09/20/2004  . COLONOSCOPY    . ENDARTERECTOMY Right 10/12/2015   Procedure: RIGHT CAROTID ENDARTERECTOMY WITH PATCH ANGIOPLASTY;  Surgeon: Elam Dutch, MD;  Location: [facility];  Service: Vascular;  Laterality: Right;  . INGUINAL HERNIA REPAIR Left 02/05/2013   Procedure: HERNIA REPAIR INGUINAL ADULT;  Surgeon: Joyice Faster. Cornett, MD;  Location: [facility];  Service: General;  Laterality: Left;  . INGUINAL HERNIA REPAIR Left   . INSERTION OF MESH Left 02/05/2013   Procedure: INSERTION OF MESH;  Surgeon: Joyice Faster. Cornett, MD;  Location: [facility];  Service: General;  Laterality: Left;  . REFRACTIVE SURGERY     2016-2017  . SHOULDER ARTHROSCOPY WITH ROTATOR CUFF REPAIR AND SUBACROMIAL DECOMPRESSION Left 05/01/2014   Procedure: LEFT ARTHROSCOPY SHOULDER SUBACROMIAL DECOMPRESSION,DISTAL CLAVICAL  RESECTION AND ROTATOR CUFF REPAIR;  Surgeon: Marin Shutter, MD;  Location: [facility];  Service: Orthopedics;  Laterality: Left;  . TESTICLAR CYST EXCISION Left   . TONSILLECTOMY    . VASECTOMY      Family History  Problem Relation Age of Onset  . Parkinsonism Father   . Dementia Father   . Cancer Father     Throat  . Colon cancer Mother     died in 25s  . Cancer Mother     Colon    No Known Allergies  Current Outpatient Prescriptions on File Prior to Visit  Medication Sig Dispense Refill  . amLODipine (NORVASC) 10 MG tablet Take 1 tablet by mouth  daily 90 tablet 3  . atorvastatin (LIPITOR) 20 MG tablet Take 1 tablet by mouth  daily 90 tablet 3  . bisoprolol (ZEBETA) 10 MG tablet Take 1 tablet (10 mg total) by mouth daily. 90 tablet 3  . Cholecalciferol (VITAMIN D-3) 1000 UNITS CAPS Take 1 capsule by mouth daily.    . clopidogrel (PLAVIX) 75 MG tablet TAKE 1 TABLET BY MOUTH  DAILY 90 tablet 2  . diazepam (VALIUM) 5 MG tablet Take 0.5-1 tablets (2.5-5 mg total) by mouth every 8 (eight) hours as needed for muscle spasms or sedation. 40 tablet 2  . fenofibrate 160 MG tablet Take 1 tablet (160 mg total) by mouth daily. 90 tablet 3  . furosemide (LASIX) 20 MG tablet Take 1 tablet (20 mg total) by mouth every other day. 90 tablet 3  . gabapentin (NEURONTIN) 300 MG capsule Take 1 capsule by mouth two times daily 180 capsule 1  . Melatonin 3 MG TABS Take 5 mg by mouth at bedtime.     Marland Kitchen oxyCODONE-acetaminophen (PERCOCET/ROXICET) 5-325 MG tablet Take 1 tablet by mouth every 8 (eight) hours as needed for moderate pain. 6 tablet 0  . tamsulosin (FLOMAX) 0.4 MG CAPS capsule TAKE 1 CAPSULE BY MOUTH  DAILY 90 capsule 2  . tiZANidine (ZANAFLEX) 4 MG tablet TAKE 1 TABLET BY MOUTH  EVERY 6 HOURS AS NEEDED FOR MUSCLE SPASMS 90 tablet 1  . vitamin B-12 (CYANOCOBALAMIN) 1000 MCG tablet Take 1,000 mcg by mouth daily.     No current facility-administered medications on file prior to visit.     BP (!)  152/64   Temp 98.4 F (36.9 C) (Oral)   Wt 191 lb 14.4 oz (87 kg)   BMI 30.06 kg/m       Objective:   Physical Exam  Constitutional: He is oriented to person, place, and time. He appears well-developed and well-nourished. No distress.  Cardiovascular: Normal  rate, regular rhythm, normal heart sounds and intact distal pulses.  Exam reveals no gallop and no friction rub.   No murmur heard. Pulmonary/Chest: Effort normal and breath sounds normal. No respiratory distress. He has no wheezes. He has no rales. He exhibits no tenderness.  Abdominal: Soft. Normal appearance. He exhibits no distension and no mass. Bowel sounds are increased. There is no tenderness. There is no rebound and no guarding.  Neurological: He is alert and oriented to person, place, and time.  Skin: Skin is warm and dry. No rash noted. He is not diaphoretic. No erythema. No pallor.  Old surgical scar on abdomen   Psychiatric: He has a normal mood and affect. His behavior is normal. Judgment and thought content normal.  Nursing note and vitals reviewed.      Assessment & Plan:  1. Diarrhea, unspecified type - likely viral gastroenteritis  - Advised Pepto or half tab imodium until formed bowel movement  - Stay hydrated and eat a clear liquid diet for the next 24 hours - Follow up if no improvement  Dorothyann Peng, NP

## 2016-04-27 NOTE — Progress Notes (Signed)
Pre visit review using our clinic review tool, if applicable. No additional management support is needed unless otherwise documented below in the visit note. 

## 2016-05-11 DIAGNOSIS — M25569 Pain in unspecified knee: Secondary | ICD-10-CM | POA: Diagnosis not present

## 2016-05-11 DIAGNOSIS — M25469 Effusion, unspecified knee: Secondary | ICD-10-CM | POA: Diagnosis not present

## 2016-05-11 DIAGNOSIS — M179 Osteoarthritis of knee, unspecified: Secondary | ICD-10-CM | POA: Diagnosis not present

## 2016-05-16 ENCOUNTER — Other Ambulatory Visit: Payer: Self-pay | Admitting: Family Medicine

## 2016-05-16 DIAGNOSIS — M5137 Other intervertebral disc degeneration, lumbosacral region: Secondary | ICD-10-CM | POA: Diagnosis not present

## 2016-05-16 DIAGNOSIS — Z79899 Other long term (current) drug therapy: Secondary | ICD-10-CM | POA: Diagnosis not present

## 2016-05-16 DIAGNOSIS — G894 Chronic pain syndrome: Secondary | ICD-10-CM | POA: Diagnosis not present

## 2016-05-16 DIAGNOSIS — Z79891 Long term (current) use of opiate analgesic: Secondary | ICD-10-CM | POA: Diagnosis not present

## 2016-05-16 DIAGNOSIS — M4696 Unspecified inflammatory spondylopathy, lumbar region: Secondary | ICD-10-CM | POA: Diagnosis not present

## 2016-05-16 DIAGNOSIS — M47817 Spondylosis without myelopathy or radiculopathy, lumbosacral region: Secondary | ICD-10-CM | POA: Diagnosis not present

## 2016-05-17 ENCOUNTER — Telehealth: Payer: Self-pay | Admitting: Emergency Medicine

## 2016-05-17 NOTE — Telephone Encounter (Signed)
Yes thanks 

## 2016-05-17 NOTE — Telephone Encounter (Signed)
Pt requesting refill [facility]  Last filled 12/30/15 #40 refills 2   Last OV 01/08/16  Okay to refill?

## 2016-05-18 ENCOUNTER — Other Ambulatory Visit: Payer: Self-pay

## 2016-05-18 MED ORDER — DIAZEPAM 5 MG PO TABS: 2.5000 mg | ORAL_TABLET | Freq: Three times a day (TID) | ORAL | 2 refills | Status: DC | PRN

## 2016-05-18 NOTE — Telephone Encounter (Signed)
Prescription faxed to the pharmacy.

## 2016-05-19 DIAGNOSIS — N183 Chronic kidney disease, stage 3 (moderate): Secondary | ICD-10-CM | POA: Diagnosis not present

## 2016-05-19 DIAGNOSIS — R5383 Other fatigue: Secondary | ICD-10-CM | POA: Diagnosis not present

## 2016-05-19 DIAGNOSIS — I10 Essential (primary) hypertension: Secondary | ICD-10-CM | POA: Diagnosis not present

## 2016-05-19 DIAGNOSIS — E785 Hyperlipidemia, unspecified: Secondary | ICD-10-CM | POA: Diagnosis not present

## 2016-05-25 DIAGNOSIS — M25469 Effusion, unspecified knee: Secondary | ICD-10-CM | POA: Diagnosis not present

## 2016-05-25 DIAGNOSIS — M25569 Pain in unspecified knee: Secondary | ICD-10-CM | POA: Diagnosis not present

## 2016-05-25 DIAGNOSIS — M179 Osteoarthritis of knee, unspecified: Secondary | ICD-10-CM | POA: Diagnosis not present

## 2016-06-08 DIAGNOSIS — M1712 Unilateral primary osteoarthritis, left knee: Secondary | ICD-10-CM | POA: Diagnosis not present

## 2016-06-08 DIAGNOSIS — M25469 Effusion, unspecified knee: Secondary | ICD-10-CM | POA: Diagnosis not present

## 2016-06-13 DIAGNOSIS — Z79899 Other long term (current) drug therapy: Secondary | ICD-10-CM | POA: Diagnosis not present

## 2016-06-13 DIAGNOSIS — M5136 Other intervertebral disc degeneration, lumbar region: Secondary | ICD-10-CM | POA: Diagnosis not present

## 2016-06-13 DIAGNOSIS — M25569 Pain in unspecified knee: Secondary | ICD-10-CM | POA: Diagnosis not present

## 2016-06-13 DIAGNOSIS — Z79891 Long term (current) use of opiate analgesic: Secondary | ICD-10-CM | POA: Diagnosis not present

## 2016-06-13 DIAGNOSIS — M47816 Spondylosis without myelopathy or radiculopathy, lumbar region: Secondary | ICD-10-CM | POA: Diagnosis not present

## 2016-06-13 DIAGNOSIS — G894 Chronic pain syndrome: Secondary | ICD-10-CM | POA: Diagnosis not present

## 2016-07-04 ENCOUNTER — Ambulatory Visit (INDEPENDENT_AMBULATORY_CARE_PROVIDER_SITE_OTHER): Payer: Medicare Other | Admitting: Family Medicine

## 2016-07-04 ENCOUNTER — Encounter: Payer: Self-pay | Admitting: Family Medicine

## 2016-07-04 VITALS — BP 138/62 | HR 57 | Temp 97.8°F | Ht 67.0 in | Wt 195.8 lb

## 2016-07-04 DIAGNOSIS — G250 Essential tremor: Secondary | ICD-10-CM | POA: Diagnosis not present

## 2016-07-04 DIAGNOSIS — I10 Essential (primary) hypertension: Secondary | ICD-10-CM | POA: Diagnosis not present

## 2016-07-04 DIAGNOSIS — I5032 Chronic diastolic (congestive) heart failure: Secondary | ICD-10-CM | POA: Diagnosis not present

## 2016-07-04 MED ORDER — PROPRANOLOL HCL 40 MG PO TABS: 40.0000 mg | ORAL_TABLET | Freq: Two times a day (BID) | ORAL | 3 refills | Status: DC

## 2016-07-04 NOTE — Patient Instructions (Signed)
Stop bisoprolol and start propranolol 40mg  twice a day  Lets check in 6-8 weeks after you receive them to check on blood pressure and tremor

## 2016-07-04 NOTE — Progress Notes (Signed)
Pre visit review using our clinic review tool, if applicable. No additional management support is needed unless otherwise documented below in the visit note. 

## 2016-07-04 NOTE — Progress Notes (Signed)
Subjective:  Kevin [name] is a 76 y.o. year old very pleasant male patient who presents for/with See problem oriented charting ROS- tremor in hands- none at rest, no chest pain. No edema. No headache.    Past Medical History-  Patient Active Problem List   Diagnosis Date Noted  . Diastolic CHF ([place]) 26/71/2458    Priority: High  . CKD (chronic kidney disease), stage III 01/27/2014    Priority: High  . AAA (abdominal aortic aneurysm, ruptured) ([place]) 01/17/2013    Priority: High  . Carotid artery stenosis s/p L carotid endarterectomy 01/12/2012    Priority: High  . History of CVA (cerebrovascular accident) 11/17/2011    Priority: High  . Chronic pain syndrome 12/23/2009    Priority: High  . Personal history of colonic polyps 05/15/2013    Priority: Medium  . Essential tremor 10/05/2009    Priority: Medium  . UNSPECIFIED ANEMIA 12/05/2008    Priority: Medium  . BPH (benign prostatic hyperplasia) 06/04/2007    Priority: Medium  . Fasting hyperglycemia 02/05/2007    Priority: Medium  . Hyperlipidemia 01/03/2007    Priority: Medium  . Essential hypertension 01/03/2007    Priority: Medium  . Family history of malignant neoplasm of gastrointestinal tract 05/15/2013    Priority: Low  . Inguinal hernia 05/01/2012    Priority: Low  . BURSITIS, HIP 12/21/2009    Priority: Low  . ACTINIC KERATOSIS, EAR, LEFT 03/09/2009    Priority: Low  . Osteoarthritis of left knee 11/02/2007    Priority: Low  . 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 PAD (peripheral artery disease) ([place]) 01/08/2016  . CAP (community acquired pneumonia) 03/17/2015    Medications- reviewed and updated Current Outpatient Prescriptions  Medication Sig Dispense Refill  . amLODipine (NORVASC) 10 MG tablet Take 1 tablet by mouth  daily 90 tablet 3  . atorvastatin (LIPITOR) 20 MG tablet Take 1 tablet by mouth  daily 90 tablet 3  . Cholecalciferol (VITAMIN D-3) 1000 UNITS CAPS Take 1 capsule  by mouth daily.    . clopidogrel (PLAVIX) 75 MG tablet TAKE 1 TABLET BY MOUTH  DAILY 90 tablet 2  . diazepam (VALIUM) 5 MG tablet Take 0.5-1 tablets (2.5-5 mg total) by mouth every 8 (eight) hours as needed for muscle spasms or sedation. 40 tablet 2  . fenofibrate 160 MG tablet TAKE 1 TABLET BY MOUTH  DAILY 90 tablet 1  . furosemide (LASIX) 20 MG tablet Take 1 tablet (20 mg total) by mouth every other day. 90 tablet 3  . gabapentin (NEURONTIN) 300 MG capsule TAKE 1 CAPSULE BY MOUTH TWO TIMES DAILY 180 capsule 1  . Melatonin 3 MG TABS Take 5 mg by mouth at bedtime.     Marland Kitchen oxyCODONE-acetaminophen (PERCOCET/ROXICET) 5-325 MG tablet Take 1 tablet by mouth every 8 (eight) hours as needed for moderate pain. 6 tablet 0  . tamsulosin (FLOMAX) 0.4 MG CAPS capsule TAKE 1 CAPSULE BY MOUTH  DAILY 90 capsule 2  . tiZANidine (ZANAFLEX) 4 MG tablet TAKE 1 TABLET BY MOUTH  EVERY 6 HOURS AS NEEDED FOR MUSCLE SPASMS 90 tablet 1  . vitamin B-12 (CYANOCOBALAMIN) 1000 MCG tablet Take 1,000 mcg by mouth daily.    . propranolol (INDERAL) 40 MG tablet Take 1 tablet (40 mg total) by mouth 2 (two) times daily. 180 tablet 3   No current facility-administered medications for this visit.     Objective: BP 138/62 (BP Location: Left Arm, Patient Position:  Sitting, Cuff Size: Large)   Pulse (!) 57   Temp 97.8 F (36.6 C) (Oral)   Ht 5\' 7"  (1.702 m)   Wt 195 lb 12.8 oz (88.8 kg)   SpO2 95%   BMI 30.67 kg/m  Gen: NAD, resting comfortably CV: RRR no murmurs rubs or gallops Lungs: CTAB no crackles, wheeze, rhonchi  Ext: no edema Skin: warm, dry Neuro: intention tremor noted  Assessment/Plan:  Essential tremor S: intention tremor seems more pronounced lately. BP controlled. Had confusion with baclofen and has been hesitant to try primidone as could also cause similar potentially A/P: we opted to change the bisoprolol to propranolol 40mg  BID to see if more help. May titrate up next visit- will also have to watch blood  pressure with this change. Alcohol helps but rarely uses.   Essential hypertension S: controlled on Bisoprolol + amlodipine 10mg . Also lasix every other day BP Readings from Last 3 Encounters:  07/04/16 138/62  04/27/16 (!) 152/64  03/23/16 138/70  A/P:Continue current amlodipine. Change bisoprolol to propranolol 40mg  BID to see if helps with tremor. offbalance with standing at times- we discussed likely multifactorial- to work on standing slowly and also stand at least 5x in a row without using his hands and build up- needs to work on his leg strength  Diastolic CHF ([place]) S:Lasix every other day 20mg  compliant. Weight down 1 lb from last visit with me. No edema A/P: continue current regimen- doing well  6-8 week BP and tremor check  Meds ordered this encounter  Medications  . propranolol (INDERAL) 40 MG tablet    Sig: Take 1 tablet (40 mg total) by mouth 2 (two) times daily.    Dispense:  180 tablet    Refill:  3   Return precautions advised.  Garret Reddish, MD

## 2016-07-05 NOTE — Assessment & Plan Note (Addendum)
S: intention tremor seems more pronounced lately. BP controlled. Had confusion with baclofen and has been hesitant to try primidone as could also cause similar potentially A/P: we opted to change the bisoprolol to propranolol 40mg  BID to see if more help. May titrate up next visit- will also have to watch blood pressure with this change. Alcohol helps but rarely uses.

## 2016-07-05 NOTE — Assessment & Plan Note (Addendum)
S: controlled on Bisoprolol + amlodipine 10mg . Also lasix every other day BP Readings from Last 3 Encounters:  07/04/16 138/62  04/27/16 (!) 152/64  03/23/16 138/70  A/P:Continue current amlodipine. Change bisoprolol to propranolol 40mg  BID to see if helps with tremor. offbalance with standing at times- we discussed likely multifactorial- to work on standing slowly and also stand at least 5x in a row without using his hands and build up- needs to work on his leg strength

## 2016-07-05 NOTE — Assessment & Plan Note (Signed)
S:Lasix every other day 20mg  compliant. Weight down 1 lb from last visit with me. No edema A/P: continue current regimen- doing well

## 2016-07-28 ENCOUNTER — Encounter: Payer: Self-pay | Admitting: Vascular Surgery

## 2016-08-01 ENCOUNTER — Other Ambulatory Visit: Payer: Self-pay | Admitting: Family Medicine

## 2016-08-01 DIAGNOSIS — M25569 Pain in unspecified knee: Secondary | ICD-10-CM | POA: Diagnosis not present

## 2016-08-01 DIAGNOSIS — M5136 Other intervertebral disc degeneration, lumbar region: Secondary | ICD-10-CM | POA: Diagnosis not present

## 2016-08-01 DIAGNOSIS — G894 Chronic pain syndrome: Secondary | ICD-10-CM | POA: Diagnosis not present

## 2016-08-01 DIAGNOSIS — M545 Low back pain: Secondary | ICD-10-CM

## 2016-08-01 DIAGNOSIS — Z79899 Other long term (current) drug therapy: Secondary | ICD-10-CM | POA: Diagnosis not present

## 2016-08-01 DIAGNOSIS — M47816 Spondylosis without myelopathy or radiculopathy, lumbar region: Secondary | ICD-10-CM | POA: Diagnosis not present

## 2016-08-01 DIAGNOSIS — Z79891 Long term (current) use of opiate analgesic: Secondary | ICD-10-CM | POA: Diagnosis not present

## 2016-08-02 DIAGNOSIS — M25469 Effusion, unspecified knee: Secondary | ICD-10-CM | POA: Diagnosis not present

## 2016-08-02 DIAGNOSIS — M25569 Pain in unspecified knee: Secondary | ICD-10-CM | POA: Diagnosis not present

## 2016-08-02 DIAGNOSIS — M179 Osteoarthritis of knee, unspecified: Secondary | ICD-10-CM | POA: Diagnosis not present

## 2016-08-03 ENCOUNTER — Other Ambulatory Visit: Payer: Self-pay | Admitting: *Deleted

## 2016-08-03 DIAGNOSIS — Z48812 Encounter for surgical aftercare following surgery on the circulatory system: Secondary | ICD-10-CM

## 2016-08-03 DIAGNOSIS — I6523 Occlusion and stenosis of bilateral carotid arteries: Secondary | ICD-10-CM

## 2016-08-04 ENCOUNTER — Ambulatory Visit (INDEPENDENT_AMBULATORY_CARE_PROVIDER_SITE_OTHER): Payer: Medicare Other | Admitting: Vascular Surgery

## 2016-08-04 ENCOUNTER — Encounter: Payer: Self-pay | Admitting: Vascular Surgery

## 2016-08-04 ENCOUNTER — Ambulatory Visit (HOSPITAL_COMMUNITY)
Admission: RE | Admit: 2016-08-04 | Discharge: 2016-08-04 | Disposition: A | Discharge status: Home / Self Care | Payer: Medicare Other | Source: Ambulatory Visit | Attending: Vascular Surgery | Admitting: Vascular Surgery

## 2016-08-04 VITALS — BP 151/76 | HR 51 | Temp 96.8°F | Resp 16 | Ht 67.0 in | Wt 195.0 lb

## 2016-08-04 DIAGNOSIS — I6523 Occlusion and stenosis of bilateral carotid arteries: Secondary | ICD-10-CM | POA: Diagnosis not present

## 2016-08-04 DIAGNOSIS — Z48812 Encounter for surgical aftercare following surgery on the circulatory system: Secondary | ICD-10-CM | POA: Insufficient documentation

## 2016-08-04 DIAGNOSIS — I714 Abdominal aortic aneurysm, without rupture, unspecified: Secondary | ICD-10-CM

## 2016-08-04 LAB — VAS US CAROTID
LCCADDIAS: 17 cm/s
LCCADSYS: 68 cm/s
LEFT ECA DIAS: -9 cm/s
LICADDIAS: -20 cm/s
LICADSYS: -61 cm/s
LICAPDIAS: -18 cm/s
Left CCA prox dias: 16 cm/s
Left CCA prox sys: 64 cm/s
Left ICA prox sys: -68 cm/s
RCCAPSYS: 42 cm/s
RIGHT CCA MID DIAS: 8 cm/s
Right CCA prox dias: 5 cm/s
Right cca dist sys: -70 cm/s

## 2016-08-04 NOTE — Progress Notes (Signed)
This is a 76 year old male who returns today for postoperative follow-up after recent right carotid endarterectomy May 2017. Left was done in 2006. He has had no symptoms of TIA amaurosis or stroke.  He denies any symptoms of shortness of breath or chest pain.  He underwent repair of ruptured abdominal aortic aneurysm in 2010. Chronic medical problems include arthritis, chronic low back pain, hyperlipidemia, hypertension all of which are stable.   Past Medical History:  Diagnosis Date  . Arthritis    DDD- Lower Back  . Bradycardia   . CAP (community acquired pneumonia) 03/17/2015   "THAT'S WHAT THEY ARE THINKING; THEY ARE NOT SURE"  . Carotid artery occlusion   . Chronic lower back pain    DDD  . CKD (chronic kidney disease)    Dr. Corliss Parish  . Diverticulosis   . Enlarged prostate    takes Flomax daily  . History of blood transfusion    "probably when they did aortic aneurysm"  . History of colon polyps    benign  . Hyperlipidemia   . Hypertension    takes Amlodipine and Zebeta daily  . Insomnia    takes Melatonin nightly  . Joint pain   . 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     . Muscle spasm    takes Zanaflex daily as needed  . Peripheral edema    takes Furosemide daily  . Pneumonia    hx of   . Rheumatic fever 1946  . Rheumatoid arthritis ([place])   . Short-term memory loss    minimal  . Shortness of breath dyspnea    with exertion but lasts very short period of time  . TIA (transient ischemic attack)    x 2;takes Plavix daily  . Urinary frequency    takes Flomax daily  . Urinary urgency    Past Surgical History:  Procedure Laterality Date  . ABDOMINAL AORTIC ANEURYSM REPAIR  2010  . CAROTID ENDARTERECTOMY Left 09/20/2004  . COLONOSCOPY    . ENDARTERECTOMY Right 10/12/2015   Procedure: RIGHT CAROTID ENDARTERECTOMY WITH PATCH ANGIOPLASTY;  Surgeon: Elam Dutch, MD;  Location: [facility];  Service: Vascular;  Laterality: Right;   . INGUINAL HERNIA REPAIR Left 02/05/2013   Procedure: HERNIA REPAIR INGUINAL ADULT;  Surgeon: Joyice Faster. Cornett, MD;  Location: [facility];  Service: General;  Laterality: Left;  . INGUINAL HERNIA REPAIR Left   . INSERTION OF MESH Left 02/05/2013   Procedure: INSERTION OF MESH;  Surgeon: Joyice Faster. Cornett, MD;  Location: [facility];  Service: General;  Laterality: Left;  . REFRACTIVE SURGERY     2016-2017  . SHOULDER ARTHROSCOPY WITH ROTATOR CUFF REPAIR AND SUBACROMIAL DECOMPRESSION Left 05/01/2014   Procedure: LEFT ARTHROSCOPY SHOULDER SUBACROMIAL DECOMPRESSION,DISTAL CLAVICAL RESECTION AND ROTATOR CUFF REPAIR;  Surgeon: Marin Shutter, MD;  Location: [facility];  Service: Orthopedics;  Laterality: Left;  . TESTICLAR CYST EXCISION Left   . TONSILLECTOMY    . VASECTOMY        Physical exam:    Vitals:   08/04/16 1349 08/04/16 1400  BP: (!) 144/71 (!) 151/76  Pulse: (!) 51   Resp: 16   Temp: (!) 96.8 F (36 C)   TempSrc: Oral   SpO2: 98%   Weight: 195 lb (88.5 kg)   Height: 5\' 7"  (1.702 m)    Abdomen: Soft nontender nondistended well-healed midline laparotomy no hernia   Neck: No carotid bruits   Neuro: Symmetric upper  and lower extremity motor strength which is 5 over 5, tongue midline   Assessment: Doing well status  carotid endarterectomy. Patient will follow-up in 9 months with repeat carotid duplex exam and continue his antiplatelet agent.   Plan: See above   [name], MD [facility] Office: [phone] Pager: [phone]

## 2016-08-09 ENCOUNTER — Ambulatory Visit
Admission: RE | Admit: 2016-08-09 | Discharge: 2016-08-09 | Disposition: A | Discharge status: Home / Self Care | Payer: Medicare Other | Source: Ambulatory Visit | Attending: Family Medicine | Admitting: Family Medicine

## 2016-08-09 DIAGNOSIS — M545 Low back pain: Secondary | ICD-10-CM

## 2016-08-09 DIAGNOSIS — M48061 Spinal stenosis, lumbar region without neurogenic claudication: Secondary | ICD-10-CM | POA: Diagnosis not present

## 2016-08-29 ENCOUNTER — Telehealth: Payer: Self-pay | Admitting: Family Medicine

## 2016-08-29 DIAGNOSIS — Z79899 Other long term (current) drug therapy: Secondary | ICD-10-CM | POA: Diagnosis not present

## 2016-08-29 DIAGNOSIS — M179 Osteoarthritis of knee, unspecified: Secondary | ICD-10-CM | POA: Diagnosis not present

## 2016-08-29 DIAGNOSIS — Z79891 Long term (current) use of opiate analgesic: Secondary | ICD-10-CM | POA: Diagnosis not present

## 2016-08-29 DIAGNOSIS — M25469 Effusion, unspecified knee: Secondary | ICD-10-CM | POA: Diagnosis not present

## 2016-08-29 DIAGNOSIS — M47816 Spondylosis without myelopathy or radiculopathy, lumbar region: Secondary | ICD-10-CM | POA: Diagnosis not present

## 2016-08-29 DIAGNOSIS — G894 Chronic pain syndrome: Secondary | ICD-10-CM | POA: Diagnosis not present

## 2016-09-01 ENCOUNTER — Inpatient Hospital Stay (HOSPITAL_COMMUNITY)
Admission: EM | Admit: 2016-09-01 | Discharge: 2016-09-03 | DRG: 871 | Disposition: A | Discharge status: Home / Self Care | Payer: Medicare Other | Attending: Internal Medicine | Admitting: Internal Medicine

## 2016-09-01 ENCOUNTER — Emergency Department (HOSPITAL_COMMUNITY): Payer: Medicare Other

## 2016-09-01 ENCOUNTER — Encounter (HOSPITAL_COMMUNITY): Payer: Self-pay

## 2016-09-01 DIAGNOSIS — I130 Hypertensive heart and chronic kidney disease with heart failure and stage 1 through stage 4 chronic kidney disease, or unspecified chronic kidney disease: Secondary | ICD-10-CM | POA: Diagnosis present

## 2016-09-01 DIAGNOSIS — I5032 Chronic diastolic (congestive) heart failure: Secondary | ICD-10-CM | POA: Diagnosis present

## 2016-09-01 DIAGNOSIS — E785 Hyperlipidemia, unspecified: Secondary | ICD-10-CM | POA: Diagnosis not present

## 2016-09-01 DIAGNOSIS — R350 Frequency of micturition: Secondary | ICD-10-CM | POA: Diagnosis present

## 2016-09-01 DIAGNOSIS — A419 Sepsis, unspecified organism: Principal | ICD-10-CM | POA: Diagnosis present

## 2016-09-01 DIAGNOSIS — J189 Pneumonia, unspecified organism: Secondary | ICD-10-CM | POA: Diagnosis present

## 2016-09-01 DIAGNOSIS — M069 Rheumatoid arthritis, unspecified: Secondary | ICD-10-CM | POA: Diagnosis not present

## 2016-09-01 DIAGNOSIS — M5136 Other intervertebral disc degeneration, lumbar region: Secondary | ICD-10-CM | POA: Diagnosis present

## 2016-09-01 DIAGNOSIS — G894 Chronic pain syndrome: Secondary | ICD-10-CM | POA: Diagnosis not present

## 2016-09-01 DIAGNOSIS — R0602 Shortness of breath: Secondary | ICD-10-CM | POA: Diagnosis not present

## 2016-09-01 DIAGNOSIS — R000 Tachycardia, unspecified: Secondary | ICD-10-CM | POA: Diagnosis not present

## 2016-09-01 DIAGNOSIS — I739 Peripheral vascular disease, unspecified: Secondary | ICD-10-CM | POA: Diagnosis not present

## 2016-09-01 DIAGNOSIS — Z87891 Personal history of nicotine dependence: Secondary | ICD-10-CM

## 2016-09-01 DIAGNOSIS — I6529 Occlusion and stenosis of unspecified carotid artery: Secondary | ICD-10-CM | POA: Diagnosis present

## 2016-09-01 DIAGNOSIS — M62838 Other muscle spasm: Secondary | ICD-10-CM | POA: Diagnosis not present

## 2016-09-01 DIAGNOSIS — Z8673 Personal history of transient ischemic attack (TIA), and cerebral infarction without residual deficits: Secondary | ICD-10-CM

## 2016-09-01 DIAGNOSIS — Z8679 Personal history of other diseases of the circulatory system: Secondary | ICD-10-CM | POA: Diagnosis not present

## 2016-09-01 DIAGNOSIS — N184 Chronic kidney disease, stage 4 (severe): Secondary | ICD-10-CM | POA: Diagnosis present

## 2016-09-01 DIAGNOSIS — Z8619 Personal history of other infectious and parasitic diseases: Secondary | ICD-10-CM

## 2016-09-01 DIAGNOSIS — G4700 Insomnia, unspecified: Secondary | ICD-10-CM | POA: Diagnosis not present

## 2016-09-01 DIAGNOSIS — D638 Anemia in other chronic diseases classified elsewhere: Secondary | ICD-10-CM | POA: Diagnosis present

## 2016-09-01 DIAGNOSIS — J181 Lobar pneumonia, unspecified organism: Secondary | ICD-10-CM | POA: Diagnosis not present

## 2016-09-01 DIAGNOSIS — N183 Chronic kidney disease, stage 3 (moderate): Secondary | ICD-10-CM | POA: Diagnosis present

## 2016-09-01 DIAGNOSIS — Z79899 Other long term (current) drug therapy: Secondary | ICD-10-CM | POA: Diagnosis not present

## 2016-09-01 DIAGNOSIS — I10 Essential (primary) hypertension: Secondary | ICD-10-CM | POA: Diagnosis present

## 2016-09-01 DIAGNOSIS — M1990 Unspecified osteoarthritis, unspecified site: Secondary | ICD-10-CM | POA: Diagnosis not present

## 2016-09-01 DIAGNOSIS — I5030 Unspecified diastolic (congestive) heart failure: Secondary | ICD-10-CM | POA: Diagnosis present

## 2016-09-01 DIAGNOSIS — Z7902 Long term (current) use of antithrombotics/antiplatelets: Secondary | ICD-10-CM

## 2016-09-01 DIAGNOSIS — N401 Enlarged prostate with lower urinary tract symptoms: Secondary | ICD-10-CM | POA: Diagnosis present

## 2016-09-01 DIAGNOSIS — R0600 Dyspnea, unspecified: Secondary | ICD-10-CM | POA: Diagnosis not present

## 2016-09-01 DIAGNOSIS — G250 Essential tremor: Secondary | ICD-10-CM | POA: Diagnosis present

## 2016-09-01 DIAGNOSIS — R069 Unspecified abnormalities of breathing: Secondary | ICD-10-CM | POA: Diagnosis not present

## 2016-09-01 LAB — CBC WITH DIFFERENTIAL/PLATELET
BASOS PCT: 1 %
Basophils Absolute: 0.1 10*3/uL (ref 0.0–0.1)
Eosinophils Absolute: 0.3 10*3/uL (ref 0.0–0.7)
Eosinophils Relative: 3 %
HCT: 35.9 % — ABNORMAL LOW (ref 39.0–52.0)
HEMOGLOBIN: 12.4 g/dL — AB (ref 13.0–17.0)
LYMPHS ABS: 1.6 10*3/uL (ref 0.7–4.0)
LYMPHS PCT: 15 %
MCH: 30.2 pg (ref 26.0–34.0)
MCHC: 34.5 g/dL (ref 30.0–36.0)
MCV: 87.6 fL (ref 78.0–100.0)
MONO ABS: 0.6 10*3/uL (ref 0.1–1.0)
MONOS PCT: 6 %
NEUTROS ABS: 8.2 10*3/uL — AB (ref 1.7–7.7)
NEUTROS PCT: 75 %
Platelets: 243 10*3/uL (ref 150–400)
RBC: 4.1 MIL/uL — ABNORMAL LOW (ref 4.22–5.81)
RDW: 13.7 % (ref 11.5–15.5)
WBC: 10.9 10*3/uL — ABNORMAL HIGH (ref 4.0–10.5)

## 2016-09-01 LAB — PROTIME-INR
INR: 1.06
Prothrombin Time: 13.8 seconds (ref 11.4–15.2)

## 2016-09-01 LAB — COMPREHENSIVE METABOLIC PANEL
ALBUMIN: 4.3 g/dL (ref 3.5–5.0)
ALT: 21 U/L (ref 17–63)
ANION GAP: 8 (ref 5–15)
AST: 33 U/L (ref 15–41)
Alkaline Phosphatase: 34 U/L — ABNORMAL LOW (ref 38–126)
BILIRUBIN TOTAL: 0.6 mg/dL (ref 0.3–1.2)
BUN: 31 mg/dL — AB (ref 6–20)
CO2: 25 mmol/L (ref 22–32)
Calcium: 8.9 mg/dL (ref 8.9–10.3)
Chloride: 106 mmol/L (ref 101–111)
Creatinine, Ser: 2.22 mg/dL — ABNORMAL HIGH (ref 0.61–1.24)
GFR calc Af Amer: 31 mL/min — ABNORMAL LOW (ref >60)
GFR calc non Af Amer: 27 mL/min — ABNORMAL LOW (ref >60)
GLUCOSE: 136 mg/dL — AB (ref 65–99)
POTASSIUM: 4.4 mmol/L (ref 3.5–5.1)
SODIUM: 139 mmol/L (ref 135–145)
TOTAL PROTEIN: 7.3 g/dL (ref 6.5–8.1)

## 2016-09-01 LAB — INFLUENZA PANEL BY PCR (TYPE A & B)
INFLAPCR: NEGATIVE
INFLBPCR: NEGATIVE

## 2016-09-01 LAB — I-STAT CG4 LACTIC ACID, ED: Lactic Acid, Venous: 1.57 mmol/L (ref 0.5–1.9)

## 2016-09-01 LAB — I-STAT TROPONIN, ED: Troponin i, poc: 0 ng/mL (ref 0.00–0.08)

## 2016-09-01 MED ORDER — ACETAMINOPHEN 500 MG PO TABS
1000.0000 mg | ORAL_TABLET | Freq: Once | ORAL | Status: AC
  Administered 2016-09-01: 1000 mg via ORAL
  Filled 2016-09-01: qty 2

## 2016-09-01 MED ORDER — SODIUM CHLORIDE 0.9 % IV BOLUS (SEPSIS)
1000.0000 mL | Freq: Once | INTRAVENOUS | Status: AC
  Administered 2016-09-01: 1000 mL via INTRAVENOUS

## 2016-09-01 MED ORDER — OSELTAMIVIR PHOSPHATE 75 MG PO CAPS
75.0000 mg | ORAL_CAPSULE | Freq: Once | ORAL | Status: AC
  Administered 2016-09-01: 75 mg via ORAL
  Filled 2016-09-01: qty 1

## 2016-09-01 MED ORDER — AZITHROMYCIN 500 MG IV SOLR
500.0000 mg | Freq: Once | INTRAVENOUS | Status: AC
  Administered 2016-09-01: 500 mg via INTRAVENOUS
  Filled 2016-09-01: qty 500

## 2016-09-01 MED ORDER — DEXTROSE 5 % IV SOLN: 500.0000 mg | INTRAVENOUS | Status: DC

## 2016-09-01 MED ORDER — CEFTRIAXONE SODIUM 1 G IJ SOLR
1.0000 g | Freq: Once | INTRAMUSCULAR | Status: AC
  Administered 2016-09-01: 1 g via INTRAVENOUS
  Filled 2016-09-01: qty 10

## 2016-09-01 MED ORDER — DEXTROSE 5 % IV SOLN
1.0000 g | INTRAVENOUS | Status: DC
  Administered 2016-09-02: 1 g via INTRAVENOUS
  Filled 2016-09-01: qty 10

## 2016-09-01 NOTE — Progress Notes (Signed)
Pharmacy Antibiotic Note  Kevin [name] is a 76 y.o. male c/o SOB admitted on 09/01/2016 with pneumonia.  Pharmacy has been consulted for rocephin and zmax dosing.  Plan: Rocephin 1 gm IV q24h Zmax 500 mg IV q24h Rx will sign off as no further adjustments necessary  Height: 5\' 10"  (177.8 cm) Weight: 195 lb (88.5 kg) IBW/kg (Calculated) : 73  Temp (24hrs), Avg:101.5 F (38.6 C), Min:101.5 F (38.6 C), Max:101.5 F (38.6 C)   Recent Labs Lab 09/01/16 2151 09/01/16 2202  WBC 10.9*  --   CREATININE 2.22*  --   LATICACIDVEN  --  1.57    Estimated Creatinine Clearance: 31.2 mL/min (A) (by C-G formula based on SCr of 2.22 mg/dL (H)).    No Known Allergies  Antimicrobials this admission: 4/5 rocephin >>  4/5 zmax >>   Dose adjustments this admission:   Microbiology results:  BCx:   UCx:    Sputum:    MRSA PCR:   Thank you for allowing pharmacy to be a part of this patient's care.  Dorrene German 09/01/2016 10:56 PM

## 2016-09-01 NOTE — ED Notes (Signed)
Bed: RESA Expected date:  Expected time:  Means of arrival:  Comments: 82m sob 200/100

## 2016-09-01 NOTE — ED Provider Notes (Signed)
Emergency Department Provider Note   I have reviewed the triage vital signs and the nursing notes.   HISTORY  Chief Complaint Fever   HPI Kevin [name] is a 76 y.o. male with PMH of HLD, HTN, TIA, and rheumatic fever presents to the emergency department for evaluation of sudden onset fatigue, difficulty breathing, cough, and epigastric discomfort. Patient states that symptoms of become progressively worse throughout the day today. Patient denies any chest pain or modifying factors. He is complaining of some epigastric discomfort that feels like "indigestion" and is asking for something to drink. He denies noticing any fevers but has developed some shaking chills. EMS gave albuterol neb in route with increased respiratory effort. Patient states this is helped his symptoms to some degree. Denies any back pain, dysuria, nausea, vomiting, diarrhea.   Past Medical History:  Diagnosis Date  . Arthritis    DDD- Lower Back  . Bradycardia   . CAP (community acquired pneumonia) 03/17/2015   "THAT'S WHAT THEY ARE THINKING; THEY ARE NOT SURE"  . Carotid artery occlusion   . Chronic lower back pain    DDD  . CKD (chronic kidney disease)    Dr. Corliss Parish  . Diverticulosis   . Enlarged prostate    takes Flomax daily  . History of blood transfusion    "probably when they did aortic aneurysm"  . History of colon polyps    benign  . Hyperlipidemia   . Hypertension    takes Amlodipine and Zebeta daily  . Insomnia    takes Melatonin nightly  . Joint pain   . 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     . Muscle spasm    takes Zanaflex daily as needed  . Peripheral edema    takes Furosemide daily  . Pneumonia    hx of   . Rheumatic fever 1946  . Rheumatoid arthritis ([place])   . Short-term memory loss    minimal  . Shortness of breath dyspnea    with exertion but lasts very short period of time  . TIA (transient ischemic attack)    x 2;takes Plavix  daily  . Urinary frequency    takes Flomax daily  . Urinary urgency     Patient Active Problem List   Diagnosis Date Noted  . Pneumonia 09/02/2016  . Sepsis ([place]) 09/01/2016  . PAD (peripheral artery disease) ([place]) 01/08/2016  . Diastolic CHF ([place]) 54/00/8676  . CAP (community acquired pneumonia) 03/17/2015  . CKD (chronic kidney disease), stage III 01/27/2014  . Family history of malignant neoplasm of gastrointestinal tract 05/15/2013  . Personal history of colonic polyps 05/15/2013  . AAA (abdominal aortic aneurysm, ruptured) ([place]) 01/17/2013  . Inguinal hernia 05/01/2012  . Carotid artery stenosis s/p L carotid endarterectomy 01/12/2012  . History of CVA (cerebrovascular accident) 11/17/2011  . Chronic pain syndrome 12/23/2009  . BURSITIS, HIP 12/21/2009  . Essential tremor 10/05/2009  . ACTINIC KERATOSIS, EAR, LEFT 03/09/2009  . UNSPECIFIED ANEMIA 12/05/2008  . Osteoarthritis of left knee 11/02/2007  . 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 BPH (benign prostatic hyperplasia) 06/04/2007  . Fasting hyperglycemia 02/05/2007  . Hyperlipidemia 01/03/2007  . Essential hypertension 01/03/2007    Past Surgical History:  Procedure Laterality Date  . ABDOMINAL AORTIC ANEURYSM REPAIR  2010  . CAROTID ENDARTERECTOMY Left 09/20/2004  . COLONOSCOPY    . ENDARTERECTOMY Right 10/12/2015   Procedure: RIGHT CAROTID ENDARTERECTOMY WITH PATCH ANGIOPLASTY;  Surgeon: Elam Dutch, MD;  Location: [facility];  Service: Vascular;  Laterality: Right;  . INGUINAL HERNIA REPAIR Left 02/05/2013   Procedure: HERNIA REPAIR INGUINAL ADULT;  Surgeon: Joyice Faster. Cornett, MD;  Location: [facility];  Service: General;  Laterality: Left;  . INGUINAL HERNIA REPAIR Left   . INSERTION OF MESH Left 02/05/2013   Procedure: INSERTION OF MESH;  Surgeon: Joyice Faster. Cornett, MD;  Location: [facility];  Service: General;  Laterality: Left;  . REFRACTIVE SURGERY     2016-2017  .  SHOULDER ARTHROSCOPY WITH ROTATOR CUFF REPAIR AND SUBACROMIAL DECOMPRESSION Left 05/01/2014   Procedure: LEFT ARTHROSCOPY SHOULDER SUBACROMIAL DECOMPRESSION,DISTAL CLAVICAL RESECTION AND ROTATOR CUFF REPAIR;  Surgeon: Marin Shutter, MD;  Location: [facility];  Service: Orthopedics;  Laterality: Left;  . TESTICLAR CYST EXCISION Left   . TONSILLECTOMY    . VASECTOMY        Allergies Patient has no known allergies.  Family History  Problem Relation Age of Onset  . Parkinsonism Father   . Dementia Father   . Cancer Father     Throat  . Colon cancer Mother     died in 45s  . Cancer Mother     Colon    Social History Social History  Substance Use Topics  . Smoking status: Former Smoker    Packs/day: 2.00    Years: 28.00    Types: Cigarettes  . Smokeless tobacco: Never Used     Comment: quit smoking "in my 87's"  . Alcohol use No    Review of Systems  Constitutional: Positive fever/chills Eyes: No visual changes. ENT: No sore throat. Cardiovascular: Denies chest pain. Respiratory: Positive shortness of breath. Gastrointestinal: Positive epigastric abdominal pain.  No nausea, no vomiting.  No diarrhea.  No constipation. Genitourinary: Negative for dysuria. Musculoskeletal: Positive for chronic back pain. Skin: Negative for rash. Neurological: Negative for headaches, focal weakness or numbness.  10-point ROS otherwise negative.  ____________________________________________   PHYSICAL EXAM:  VITAL SIGNS: ED Triage Vitals  Enc Vitals Group     BP 09/01/16 2139 (!) 177/98     Pulse Rate 09/01/16 2139 97     Resp 09/01/16 2139 (!) 24     Temp 09/01/16 2139 (!) 101.5 F (38.6 C)     Temp Source 09/01/16 2139 Rectal     SpO2 09/01/16 2139 100 %     Weight 09/01/16 2127 195 lb (88.5 kg)     Height 09/01/16 2127 5\' 10"  (1.778 m)     Pain Score 09/01/16 2126 5   Constitutional: Alert and oriented. Appears uncomfortable and in moderate acute distress with notable rigors  and increased WOB.  Eyes: Conjunctivae are normal. Head: Atraumatic. Nose: No congestion/rhinnorhea. Mouth/Throat: Mucous membranes are dry. Oropharynx non-erythematous. Neck: No stridor.  Cardiovascular: Sinus tachycardia. Good peripheral circulation. Grossly normal heart sounds.   Respiratory: Increased respiratory effort.  No retractions. Lungs are diminished at the bases. No wheezing.  Gastrointestinal: Soft and nontender. No distention.  Musculoskeletal: No lower extremity tenderness nor edema. No gross deformities of extremities. Neurologic:  Normal speech and language. No gross focal neurologic deficits are appreciated.  Skin:  Skin is warm, dry and intact. No rash noted. Psychiatric: Mood and affect are normal. Speech and behavior are normal.  ____________________________________________   LABS (all labs ordered are listed, but only abnormal results are displayed)  Labs Reviewed  COMPREHENSIVE METABOLIC PANEL - Abnormal; Notable for the following:       Result Value   Glucose,  Bld 136 (*)    BUN 31 (*)    Creatinine, Ser 2.22 (*)    Alkaline Phosphatase 34 (*)    GFR calc non Af Amer 27 (*)    GFR calc Af Amer 31 (*)    All other components within normal limits  CBC WITH DIFFERENTIAL/PLATELET - Abnormal; Notable for the following:    WBC 10.9 (*)    RBC 4.10 (*)    Hemoglobin 12.4 (*)    HCT 35.9 (*)    Neutro Abs 8.2 (*)    All other components within normal limits  CULTURE, BLOOD (ROUTINE X 2)  CULTURE, BLOOD (ROUTINE X 2)  CULTURE, EXPECTORATED SPUTUM-ASSESSMENT  GRAM STAIN  PROTIME-INR  URINALYSIS, ROUTINE W REFLEX MICROSCOPIC  INFLUENZA PANEL BY PCR (TYPE A & B)  STREP PNEUMONIAE URINARY ANTIGEN  BASIC METABOLIC PANEL  CBC  LEGIONELLA PNEUMOPHILA SEROGP 1 UR AG  HIV ANTIBODY (ROUTINE TESTING)  I-STAT CG4 LACTIC ACID, ED  I-STAT TROPOININ, ED   ____________________________________________  EKG   EKG Interpretation  Date/Time:  Thursday September 01 2016 22:36:00 EDT Ventricular Rate:  102 PR Interval:    QRS Duration: 157 QT Interval:  340 QTC Calculation: 443 R Axis:   5 Text Interpretation:  Sinus tachycardia Consider right atrial enlargement Nonspecific intraventricular conduction delay Lateral infarct, old Artifact in lead(s) I II III aVR aVL aVF V1 V2 No clear STEMI. Will repeat Confirmed by LONG MD, JOSHUA [id number]) on 09/02/2016 9:04:33 AM       ____________________________________________  RADIOLOGY  Dg Chest 2 View  Result Date: 09/01/2016 CLINICAL DATA:  Dyspnea for 2 hours. EXAM: CHEST  2 VIEW COMPARISON:  04/10/2015 FINDINGS: Left base opacity is new and may represent pneumonia. Right lung is clear. Pulmonary vasculature is normal. No pleural effusions. IMPRESSION: Left base consolidation, suspicious for pneumonia. Followup PA and lateral chest X-ray is recommended in 3-4 weeks following trial of antibiotic therapy to ensure resolution and exclude underlying malignancy. Electronically Signed   By: Andreas Newport M.D.   On: 09/01/2016 22:29    ____________________________________________   PROCEDURES  Procedure(s) performed:   Procedures  CRITICAL CARE Performed by: Margette Fast Total critical care time: 35 minutes Critical care time was exclusive of separately billable procedures and treating other patients. Critical care was necessary to treat or prevent imminent or life-threatening deterioration. Critical care was time spent personally by me on the following activities: development of treatment plan with patient and/or surrogate as well as nursing, discussions with consultants, evaluation of patient's response to treatment, examination of patient, obtaining history from patient or surrogate, ordering and performing treatments and interventions, ordering and review of laboratory studies, ordering and review of radiographic studies, pulse oximetry and re-evaluation of patient's condition.  Nanda Quinton,  MD Emergency Medicine  ____________________________________________   INITIAL IMPRESSION / ASSESSMENT AND PLAN / ED COURSE  Pertinent labs & imaging results that were available during my care of the patient were reviewed by me and considered in my medical decision making (see chart for details).  Patient resents to the emergency department for evaluation of difficulty breathing, fever, tachypnea. Symptoms are concerning for possible influenza versus pneumonia. Patient did not get his flu shot this year. Sepsis protocol initiated with cultures, chest x-ray, fluids, Tylenol. Patient appears acutely ill with likely Sepsis. EKG with significant artifact from ongoing rigors.   Wife now at bedside. Patient looking more comfortable. No hypoxemia. Epigastric discomfort is improved. Unclear if this is an anginal equivalent but  initial troponin is negative. CXR with LLL infiltrate which corresponds clinically with PNA. Gave Tamiflu on arrival. Will add abx for PNA along with IVF. Will repeat EKG when artifact is diminished. Updated patient and wife in detail at bedside.   Discussed patient's case with hospitalist, Dr. Hal Hope. Patient and family (if present) updated with plan. Care transferred to hospitalist service.  I reviewed all nursing notes, vitals, pertinent old records, EKGs, labs, imaging (as available).  ____________________________________________  FINAL CLINICAL IMPRESSION(S) / ED DIAGNOSES  Final diagnoses:  Community acquired pneumonia of left lower lobe of lung ([place])     MEDICATIONS GIVEN DURING THIS VISIT:  Medications  cefTRIAXone (ROCEPHIN) 1 g in dextrose 5 % 50 mL IVPB (not administered)  baclofen (LIORESAL) tablet 10 mg (10 mg Oral Given 09/02/16 0133)  propranolol (INDERAL) tablet 40 mg (not administered)  diazepam (VALIUM) tablet 2.5-5 mg (5 mg Oral Given 09/02/16 0136)  fenofibrate tablet 160 mg (not administered)  gabapentin (NEURONTIN) capsule 300 mg (not  administered)  tiZANidine (ZANAFLEX) tablet 4 mg (4 mg Oral Given 09/02/16 0136)  clopidogrel (PLAVIX) tablet 75 mg (not administered)  tamsulosin (FLOMAX) capsule 0.4 mg (not administered)  amLODipine (NORVASC) tablet 10 mg (not administered)  atorvastatin (LIPITOR) tablet 20 mg (not administered)  oxyCODONE-acetaminophen (PERCOCET/ROXICET) 5-325 MG per tablet 1 tablet (1 tablet Oral Given 09/02/16 0136)  cholecalciferol (VITAMIN D) tablet 1,000 Units (not administered)  vitamin B-12 (CYANOCOBALAMIN) tablet 1,000 mcg (not administered)  acetaminophen (TYLENOL) tablet 650 mg (not administered)    Or  acetaminophen (TYLENOL) suppository 650 mg (not administered)  ondansetron (ZOFRAN) tablet 4 mg (not administered)    Or  ondansetron (ZOFRAN) injection 4 mg (not administered)  enoxaparin (LOVENOX) injection 40 mg (not administered)  0.9 %  sodium chloride infusion ( Intravenous New Bag/Given 09/02/16 0141)  feeding supplement (ENSURE ENLIVE) (ENSURE ENLIVE) liquid 237 mL (not administered)  azithromycin (ZITHROMAX) tablet 500 mg (not administered)  sodium chloride 0.9 % bolus 1,000 mL (0 mLs Intravenous Stopped 09/01/16 2257)  acetaminophen (TYLENOL) tablet 1,000 mg (1,000 mg Oral Given 09/01/16 2216)  oseltamivir (TAMIFLU) capsule 75 mg (75 mg Oral Given 09/01/16 2216)  cefTRIAXone (ROCEPHIN) 1 g in dextrose 5 % 50 mL IVPB (0 g Intravenous Stopped 09/01/16 2322)  azithromycin (ZITHROMAX) 500 mg in dextrose 5 % 250 mL IVPB (0 mg Intravenous Stopped 09/01/16 2352)  sodium chloride 0.9 % bolus 1,000 mL (0 mLs Intravenous Stopped 09/01/16 2353)     NEW OUTPATIENT MEDICATIONS STARTED DURING THIS VISIT:  None   Note:  This document was prepared using Dragon voice recognition software and may include unintentional dictation errors.  Nanda Quinton, MD Emergency Medicine   Margette Fast, MD 09/02/16 [phone]

## 2016-09-01 NOTE — ED Notes (Signed)
Both sets of blood cultures were drawn by 21:57.

## 2016-09-02 ENCOUNTER — Encounter (HOSPITAL_COMMUNITY): Payer: Self-pay | Admitting: Internal Medicine

## 2016-09-02 DIAGNOSIS — A419 Sepsis, unspecified organism: Principal | ICD-10-CM

## 2016-09-02 DIAGNOSIS — J189 Pneumonia, unspecified organism: Secondary | ICD-10-CM | POA: Insufficient documentation

## 2016-09-02 LAB — CBC
HCT: 31.8 % — ABNORMAL LOW (ref 39.0–52.0)
HEMOGLOBIN: 10.6 g/dL — AB (ref 13.0–17.0)
MCH: 30 pg (ref 26.0–34.0)
MCHC: 33.3 g/dL (ref 30.0–36.0)
MCV: 90.1 fL (ref 78.0–100.0)
PLATELETS: 232 10*3/uL (ref 150–400)
RBC: 3.53 MIL/uL — ABNORMAL LOW (ref 4.22–5.81)
RDW: 14 % (ref 11.5–15.5)
WBC: 14.9 10*3/uL — ABNORMAL HIGH (ref 4.0–10.5)

## 2016-09-02 LAB — URINALYSIS, ROUTINE W REFLEX MICROSCOPIC
BILIRUBIN URINE: NEGATIVE
GLUCOSE, UA: NEGATIVE mg/dL
Hgb urine dipstick: NEGATIVE
KETONES UR: NEGATIVE mg/dL
LEUKOCYTES UA: NEGATIVE
NITRITE: NEGATIVE
PROTEIN: NEGATIVE mg/dL
Specific Gravity, Urine: 1.011 (ref 1.005–1.030)
pH: 7 (ref 5.0–8.0)

## 2016-09-02 LAB — BASIC METABOLIC PANEL
ANION GAP: 5 (ref 5–15)
BUN: 25 mg/dL — ABNORMAL HIGH (ref 6–20)
CALCIUM: 8.2 mg/dL — AB (ref 8.9–10.3)
CO2: 23 mmol/L (ref 22–32)
CREATININE: 2 mg/dL — AB (ref 0.61–1.24)
Chloride: 111 mmol/L (ref 101–111)
GFR calc Af Amer: 35 mL/min — ABNORMAL LOW (ref >60)
GFR, EST NON AFRICAN AMERICAN: 30 mL/min — AB (ref >60)
GLUCOSE: 114 mg/dL — AB (ref 65–99)
Potassium: 4.2 mmol/L (ref 3.5–5.1)
Sodium: 139 mmol/L (ref 135–145)

## 2016-09-02 LAB — STREP PNEUMONIAE URINARY ANTIGEN: STREP PNEUMO URINARY ANTIGEN: NEGATIVE

## 2016-09-02 MED ORDER — ACETAMINOPHEN 650 MG RE SUPP: 650.0000 mg | Freq: Four times a day (QID) | RECTAL | Status: DC | PRN

## 2016-09-02 MED ORDER — TIZANIDINE HCL 4 MG PO TABS
4.0000 mg | ORAL_TABLET | Freq: Four times a day (QID) | ORAL | Status: DC | PRN
  Administered 2016-09-02 – 2016-09-03 (×4): 4 mg via ORAL
  Filled 2016-09-02 (×4): qty 1

## 2016-09-02 MED ORDER — SODIUM CHLORIDE 0.9 % IV SOLN
INTRAVENOUS | Status: AC
  Administered 2016-09-02 (×2): via INTRAVENOUS

## 2016-09-02 MED ORDER — FENOFIBRATE 160 MG PO TABS
160.0000 mg | ORAL_TABLET | Freq: Every day | ORAL | Status: DC
  Administered 2016-09-02 – 2016-09-03 (×2): 160 mg via ORAL
  Filled 2016-09-02 (×2): qty 1

## 2016-09-02 MED ORDER — FUROSEMIDE 20 MG PO TABS
20.0000 mg | ORAL_TABLET | Freq: Every day | ORAL | Status: DC
  Administered 2016-09-03: 20 mg via ORAL
  Filled 2016-09-02: qty 1

## 2016-09-02 MED ORDER — VITAMIN D 1000 UNITS PO TABS
1000.0000 [IU] | ORAL_TABLET | Freq: Every day | ORAL | Status: DC
  Administered 2016-09-02 – 2016-09-03 (×2): 1000 [IU] via ORAL
  Filled 2016-09-02 (×2): qty 1

## 2016-09-02 MED ORDER — BISOPROLOL FUMARATE 10 MG PO TABS
10.0000 mg | ORAL_TABLET | Freq: Every day | ORAL | Status: DC
  Filled 2016-09-02: qty 1

## 2016-09-02 MED ORDER — ONDANSETRON HCL 4 MG/2ML IJ SOLN
4.0000 mg | Freq: Four times a day (QID) | INTRAMUSCULAR | Status: DC | PRN
Start: 2016-09-02 — End: 2016-09-03

## 2016-09-02 MED ORDER — OXYCODONE-ACETAMINOPHEN 5-325 MG PO TABS
1.0000 | ORAL_TABLET | Freq: Three times a day (TID) | ORAL | Status: DC | PRN
  Administered 2016-09-02 – 2016-09-03 (×4): 1 via ORAL
  Filled 2016-09-02 (×4): qty 1

## 2016-09-02 MED ORDER — ATORVASTATIN CALCIUM 20 MG PO TABS
20.0000 mg | ORAL_TABLET | Freq: Every day | ORAL | Status: DC
  Administered 2016-09-02: 20 mg via ORAL
  Filled 2016-09-02: qty 1
  Filled 2016-09-02: qty 2

## 2016-09-02 MED ORDER — ACETAMINOPHEN 325 MG PO TABS
650.0000 mg | ORAL_TABLET | Freq: Four times a day (QID) | ORAL | Status: DC | PRN
Start: 2016-09-02 — End: 2016-09-03

## 2016-09-02 MED ORDER — CLOPIDOGREL BISULFATE 75 MG PO TABS
75.0000 mg | ORAL_TABLET | Freq: Every day | ORAL | Status: DC
  Administered 2016-09-02 – 2016-09-03 (×2): 75 mg via ORAL
  Filled 2016-09-02 (×2): qty 1

## 2016-09-02 MED ORDER — AMLODIPINE BESYLATE 5 MG PO TABS
10.0000 mg | ORAL_TABLET | Freq: Every day | ORAL | Status: DC
  Administered 2016-09-02 – 2016-09-03 (×2): 10 mg via ORAL
  Filled 2016-09-02 (×2): qty 2

## 2016-09-02 MED ORDER — VITAMIN B-12 1000 MCG PO TABS
1000.0000 ug | ORAL_TABLET | Freq: Every day | ORAL | Status: DC
  Administered 2016-09-02 – 2016-09-03 (×2): 1000 ug via ORAL
  Filled 2016-09-02 (×2): qty 1

## 2016-09-02 MED ORDER — AZITHROMYCIN 250 MG PO TABS
500.0000 mg | ORAL_TABLET | Freq: Every day | ORAL | Status: DC
  Administered 2016-09-02: 500 mg via ORAL
  Filled 2016-09-02: qty 2

## 2016-09-02 MED ORDER — GABAPENTIN 300 MG PO CAPS
300.0000 mg | ORAL_CAPSULE | Freq: Three times a day (TID) | ORAL | Status: DC
  Administered 2016-09-02 – 2016-09-03 (×4): 300 mg via ORAL
  Filled 2016-09-02 (×4): qty 1

## 2016-09-02 MED ORDER — DIAZEPAM 5 MG PO TABS
2.5000 mg | ORAL_TABLET | Freq: Three times a day (TID) | ORAL | Status: DC | PRN
  Administered 2016-09-02: 5 mg via ORAL
  Filled 2016-09-02: qty 1

## 2016-09-02 MED ORDER — ONDANSETRON HCL 4 MG PO TABS: 4.0000 mg | ORAL_TABLET | Freq: Four times a day (QID) | ORAL | Status: DC | PRN

## 2016-09-02 MED ORDER — TAMSULOSIN HCL 0.4 MG PO CAPS
0.4000 mg | ORAL_CAPSULE | Freq: Every day | ORAL | Status: DC
  Administered 2016-09-02 – 2016-09-03 (×2): 0.4 mg via ORAL
  Filled 2016-09-02 (×2): qty 1

## 2016-09-02 MED ORDER — BACLOFEN 10 MG PO TABS
10.0000 mg | ORAL_TABLET | Freq: Two times a day (BID) | ORAL | Status: DC
  Administered 2016-09-02 – 2016-09-03 (×3): 10 mg via ORAL
  Filled 2016-09-02 (×2): qty 1

## 2016-09-02 MED ORDER — ENSURE ENLIVE PO LIQD: 237.0000 mL | Freq: Two times a day (BID) | ORAL | Status: DC

## 2016-09-02 MED ORDER — PROPRANOLOL HCL 40 MG PO TABS
40.0000 mg | ORAL_TABLET | Freq: Two times a day (BID) | ORAL | Status: DC
  Administered 2016-09-02 – 2016-09-03 (×3): 40 mg via ORAL
  Filled 2016-09-02 (×2): qty 1
  Filled 2016-09-02 (×3): qty 2
  Filled 2016-09-02: qty 1

## 2016-09-02 MED ORDER — ENOXAPARIN SODIUM 40 MG/0.4ML ~~LOC~~ SOLN
40.0000 mg | SUBCUTANEOUS | Status: DC
  Administered 2016-09-02: 40 mg via SUBCUTANEOUS
  Filled 2016-09-02 (×2): qty 0.4

## 2016-09-02 MED ORDER — MELATONIN 3 MG PO TABS: 5.0000 mg | ORAL_TABLET | Freq: Every day | ORAL | Status: DC

## 2016-09-02 NOTE — Progress Notes (Signed)
Initial Nutrition Assessment  DOCUMENTATION CODES:   Not applicable  INTERVENTION:   Ensure Enlive po BID, each supplement provides 350 kcal and 20 grams of protein  NUTRITION DIAGNOSIS:   Increased nutrient needs related to acute illness, PNA with sepsis as evidenced by increased estimated needs from protein.  GOAL:   Patient will meet greater than or equal to 90% of their needs  MONITOR:   PO intake, Supplement acceptance, Labs, Weight trends  REASON FOR ASSESSMENT:   Malnutrition Screening Tool    ASSESSMENT:   76 y.o. male with history of hypertension, diastolic CHF, carotid endarterectomy, chronic kidney disease presents to the ER because of fever and chills. Patient admitted for CAP with sepsis  Met with pt in room today. Pt reports good appetite pta and currently eating 75% meals. Per chart, pt is weight stable. RD discussed with pt the importance of adequate protein intake to preserve lean muscle mass. Pt ordered for Ensure BID; RD encouraged pt to drink supplements.   Medications reviewed and include: azithromycin, ceftriaxone, Vit D, plavix, lovenox, lasix, Vit B12, oxycodone  Labs reviewed: BUN 25(H), creat 2.0(H), Ca 8.2(L) Wbc- 14.9(H)  Nutrition-Focused physical exam completed. Findings are no fat depletion, no muscle depletion, and no edema.   Diet Order:  Diet Heart Room service appropriate? Yes; Fluid consistency: Thin  Skin:  Reviewed, no issues  Last BM:  4/6  Height:   Ht Readings from Last 1 Encounters:  09/02/16 _0  (1.778 m)    Weight:   Wt Readings from Last 1 Encounters:  09/02/16 195 lb 1.7 oz (88.5 kg)    Ideal Body Weight:  75.4 kg  BMI:  Body mass index is 27.99 kg/m.  Estimated Nutritional Needs:   Kcal:  2000-2300kcal/day   Protein:  98-115g/day   Fluid:  >1.2L/day   EDUCATION NEEDS:   No education needs identified at this time  Koleen Distance, RD, LDN Pager #[phone]

## 2016-09-02 NOTE — H&P (Addendum)
History and Physical    [name]:[id number] DOB: Oct 26, 1940 DOA: 09/01/2016  PCP: Garret Reddish, MD  Patient coming from: Home.  Chief Complaint: Fever and chills.  HPI: Kevin [name] is a 76 y.o. male with history of hypertension, diastolic CHF, carotid endarterectomy, chronic kidney disease presents to the ER because of fever and chills. Patient has been having this symptom for last 2 days. Patient also has been having productive sputum. Patient states his sputum has been dark-colored at times yellow. Denies any recent travel or sick contacts.   ED Course: In the ER patient is found to be tachycardic febrile and lab work showed leukocytosis. Chest x-ray shows features consistent with pneumonia and admitted for sepsis from pneumonia.  Review of Systems: As per HPI, rest all negative.   Past Medical History:  Diagnosis Date  . Arthritis    DDD- Lower Back  . Bradycardia   . CAP (community acquired pneumonia) 03/17/2015   "THAT'S WHAT THEY ARE THINKING; THEY ARE NOT SURE"  . Carotid artery occlusion   . Chronic lower back pain    DDD  . CKD (chronic kidney disease)    Dr. Corliss Parish  . Diverticulosis   . Enlarged prostate    takes Flomax daily  . History of blood transfusion    "probably when they did aortic aneurysm"  . History of colon polyps    benign  . Hyperlipidemia   . Hypertension    takes Amlodipine and Zebeta daily  . Insomnia    takes Melatonin nightly  . Joint pain   . 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     . Muscle spasm    takes Zanaflex daily as needed  . Peripheral edema    takes Furosemide daily  . Pneumonia    hx of   . Rheumatic fever 1946  . Rheumatoid arthritis ([place])   . Short-term memory loss    minimal  . Shortness of breath dyspnea    with exertion but lasts very short period of time  . TIA (transient ischemic attack)    x 2;takes Plavix daily  . Urinary frequency    takes Flomax daily  .  Urinary urgency     Past Surgical History:  Procedure Laterality Date  . ABDOMINAL AORTIC ANEURYSM REPAIR  2010  . CAROTID ENDARTERECTOMY Left 09/20/2004  . COLONOSCOPY    . ENDARTERECTOMY Right 10/12/2015   Procedure: RIGHT CAROTID ENDARTERECTOMY WITH PATCH ANGIOPLASTY;  Surgeon: Elam Dutch, MD;  Location: [place];  Service: Vascular;  Laterality: Right;  . INGUINAL HERNIA REPAIR Left 02/05/2013   Procedure: HERNIA REPAIR INGUINAL ADULT;  Surgeon: Joyice Faster. Cornett, MD;  Location: [facility];  Service: General;  Laterality: Left;  . INGUINAL HERNIA REPAIR Left   . INSERTION OF MESH Left 02/05/2013   Procedure: INSERTION OF MESH;  Surgeon: Joyice Faster. Cornett, MD;  Location: [place] [facility];  Service: General;  Laterality: Left;  . REFRACTIVE SURGERY     2016-2017  . SHOULDER ARTHROSCOPY WITH ROTATOR CUFF REPAIR AND SUBACROMIAL DECOMPRESSION Left 05/01/2014   Procedure: LEFT ARTHROSCOPY SHOULDER SUBACROMIAL DECOMPRESSION,DISTAL CLAVICAL RESECTION AND ROTATOR CUFF REPAIR;  Surgeon: Marin Shutter, MD;  Location: [place];  Service: Orthopedics;  Laterality: Left;  . TESTICLAR CYST EXCISION Left   . TONSILLECTOMY    . VASECTOMY       reports that he has quit smoking. His smoking use included Cigarettes. He has a  56.00 pack-year smoking history. He has never used smokeless tobacco. He reports that he does not drink alcohol or use drugs.  No Known Allergies  Family History  Problem Relation Age of Onset  . Parkinsonism Father   . Dementia Father   . Cancer Father     Throat  . Colon cancer Mother     died in 31s  . Cancer Mother     Colon    Prior to Admission medications   Medication Sig Start Date End Date Taking? Authorizing Provider  acetaminophen (TYLENOL) 500 MG tablet Take 500 mg by mouth every 6 (six) hours as needed for mild pain.   Yes Historical Provider, MD  amLODipine (NORVASC) 10 MG tablet Take 1 tablet by mouth  daily 12/21/15  Yes Marin Olp, MD  atorvastatin (LIPITOR) 20 MG tablet Take 1 tablet by mouth  daily 12/21/15  Yes Marin Olp, MD  baclofen (LIORESAL) 10 MG tablet Take 10 mg by mouth 2 (two) times daily. 06/13/16  Yes Historical Provider, MD  bisoprolol (ZEBETA) 10 MG tablet Take 10 mg by mouth daily. 07/03/16  Yes Historical Provider, MD  Cholecalciferol (VITAMIN D-3) 1000 UNITS CAPS Take 1 capsule by mouth daily.   Yes Historical Provider, MD  clopidogrel (PLAVIX) 75 MG tablet TAKE 1 TABLET BY MOUTH  DAILY 03/25/16  Yes Marin Olp, MD  diazepam (VALIUM) 5 MG tablet Take 0.5-1 tablets (2.5-5 mg total) by mouth every 8 (eight) hours as needed for muscle spasms or sedation. 05/18/16  Yes Marin Olp, MD  fenofibrate 160 MG tablet TAKE 1 TABLET BY MOUTH  DAILY 05/17/16  Yes Marin Olp, MD  furosemide (LASIX) 20 MG tablet Take 1 tablet (20 mg total) by mouth every other day. 04/27/16  Yes Dorothyann Peng, NP  gabapentin (NEURONTIN) 300 MG capsule TAKE 1 CAPSULE BY MOUTH TWO TIMES DAILY 05/17/16  Yes Marin Olp, MD  Melatonin 3 MG TABS Take 5 mg by mouth at bedtime.    Yes Historical Provider, MD  oxyCODONE-acetaminophen (PERCOCET/ROXICET) 5-325 MG tablet Take 1 tablet by mouth every 8 (eight) hours as needed for moderate pain. 10/12/15  Yes Ulyses Amor, PA-C  propranolol (INDERAL) 40 MG tablet Take 1 tablet (40 mg total) by mouth 2 (two) times daily. 07/04/16  Yes Marin Olp, MD  tamsulosin (FLOMAX) 0.4 MG CAPS capsule TAKE 1 CAPSULE BY MOUTH  DAILY 03/25/16  Yes Marin Olp, MD  tiZANidine (ZANAFLEX) 4 MG tablet TAKE 1 TABLET BY MOUTH  EVERY 6 HOURS AS NEEDED FOR MUSCLE SPASMS 04/01/16  Yes Marin Olp, MD  vitamin B-12 (CYANOCOBALAMIN) 1000 MCG tablet Take 1,000 mcg by mouth daily.   Yes Historical Provider, MD    Physical Exam: Vitals:   09/01/16 1062 09/01/16 2330 09/01/16 2343 09/01/16 2345  BP: 135/69 (!) 174/69 (!) 174/69 (!) 144/60  Pulse: 100 (!) 102 95 95  Resp: (!) 31  (!) 31 (!) 22 (!) 27  Temp:      TempSrc:      SpO2: 97% 96% 100% 98%  Weight:      Height:          Constitutional: Moderately built and nourished. Vitals:   09/01/16 2315 09/01/16 2330 09/01/16 2343 09/01/16 2345  BP: 135/69 (!) 174/69 (!) 174/69 (!) 144/60  Pulse: 100 (!) 102 95 95  Resp: (!) 31 (!) 31 (!) 22 (!) 27  Temp:      TempSrc:  SpO2: 97% 96% 100% 98%  Weight:      Height:       Eyes: Anicteric no pallor. ENMT: No discharge from the ears eyes nose and mouth. Neck: No mass felt. No neck rigidity. No JVD appreciated. Respiratory: No rhonchi or crepitations. Cardiovascular: S1-S2 heard. No murmurs appreciated. Abdomen: Soft nontender bowel sounds present. Musculoskeletal: No edema. No joint effusion. Skin: No rash. Skin appears warm. Neurologic: Alert awake oriented to time place and person. Moves all extremities. Psychiatric: Appears normal. Normal affect.   Labs on Admission: I have personally reviewed following labs and imaging studies  CBC:  Recent Labs Lab 09/01/16 2151  WBC 10.9*  NEUTROABS 8.2*  HGB 12.4*  HCT 35.9*  MCV 87.6  PLT 458   Basic Metabolic Panel:  Recent Labs Lab 09/01/16 2151  NA 139  K 4.4  CL 106  CO2 25  GLUCOSE 136*  BUN 31*  CREATININE 2.22*  CALCIUM 8.9   GFR: Estimated Creatinine Clearance: 31.2 mL/min (A) (by C-G formula based on SCr of 2.22 mg/dL (H)). Liver Function Tests:  Recent Labs Lab 09/01/16 2151  AST 33  ALT 21  ALKPHOS 34*  BILITOT 0.6  PROT 7.3  ALBUMIN 4.3   No results for input(s): LIPASE, AMYLASE in the last 168 hours. No results for input(s): AMMONIA in the last 168 hours. Coagulation Profile:  Recent Labs Lab 09/01/16 2151  INR 1.06   Cardiac Enzymes: No results for input(s): CKTOTAL, CKMB, CKMBINDEX, TROPONINI in the last 168 hours. BNP (last 3 results) No results for input(s): PROBNP in the last 8760 hours. HbA1C: No results for input(s): HGBA1C in the last 72  hours. CBG: No results for input(s): GLUCAP in the last 168 hours. Lipid Profile: No results for input(s): CHOL, HDL, LDLCALC, TRIG, CHOLHDL, LDLDIRECT in the last 72 hours. Thyroid Function Tests: No results for input(s): TSH, T4TOTAL, FREET4, T3FREE, THYROIDAB in the last 72 hours. Anemia Panel: No results for input(s): VITAMINB12, FOLATE, FERRITIN, TIBC, IRON, RETICCTPCT in the last 72 hours. Urine analysis:    Component Value Date/Time   COLORURINE YELLOW 09/01/2016 2356   APPEARANCEUR CLEAR 09/01/2016 2356   LABSPEC 1.011 09/01/2016 2356   PHURINE 7.0 09/01/2016 2356   GLUCOSEU NEGATIVE 09/01/2016 2356   GLUCOSEU NEGATIVE 09/24/2010 0758   HGBUR NEGATIVE 09/01/2016 2356   HGBUR negative 11/20/2008 0833   BILIRUBINUR NEGATIVE 09/01/2016 2356   BILIRUBINUR n 12/21/2015 1036   KETONESUR NEGATIVE 09/01/2016 2356   PROTEINUR NEGATIVE 09/01/2016 2356   UROBILINOGEN 0.2 12/21/2015 1036   UROBILINOGEN 0.2 03/17/2015 1907   NITRITE NEGATIVE 09/01/2016 2356   LEUKOCYTESUR NEGATIVE 09/01/2016 2356   Sepsis Labs: @LABRCNTIP (procalcitonin:4,lacticidven:4) )No results found for this or any previous visit (from the past 240 hour(s)).   Radiological Exams on Admission: Dg Chest 2 View  Result Date: 09/01/2016 CLINICAL DATA:  Dyspnea for 2 hours. EXAM: CHEST  2 VIEW COMPARISON:  04/10/2015 FINDINGS: Left base opacity is new and may represent pneumonia. Right lung is clear. Pulmonary vasculature is normal. No pleural effusions. IMPRESSION: Left base consolidation, suspicious for pneumonia. Followup PA and lateral chest X-ray is recommended in 3-4 weeks following trial of antibiotic therapy to ensure resolution and exclude underlying malignancy. Electronically Signed   By: Andreas Newport M.D.   On: 09/01/2016 22:29    Assessment/Plan Principal Problem:   Sepsis [facility]) Active Problems:   Hyperlipidemia   Essential hypertension   Chronic pain syndrome   Carotid artery stenosis s/p L  carotid endarterectomy  AAA (abdominal aortic aneurysm, ruptured) ([place])   CKD (chronic kidney disease), stage III   CAP (community acquired pneumonia)   Diastolic CHF ([place])   PAD (peripheral artery disease) ([facility])   Pneumonia    1. Sepsis from pneumonia - patient has been placed on empiric antibiotics for community-acquired pneumonia. Follow blood cultures, urine for Legionella strep antigen, influenza PCR HIV status. Continue with hydration. 2. Hypertension on beta blocker Norvasc which will be continued. 3. Carotid endarterectomy on Plavix and statins. 4. Chronic pain syndrome - on Percocet and tizanidine and gabapentin. 5. Chronic kidney disease stage III - creatinine appears to be at baseline. 6. Anemia probably from chronic disease - follow CBC. 7. History of abdominal aortic aneurysm - denies any abdominal pain. 8. History of diastolic dysfunction per 2-D echo in January 2017 - holding Lasix due to sepsis.   DVT prophylaxis: Heparin. Code Status: Full code.  Family Communication: Discussed with patient's wife.  Disposition Plan: Home.  Consults called: None.  Admission status: Inpatient.    Rise Patience MD [facility] Pager [phone].  If 7PM-7AM, please contact night-coverage [email or web address] Password [email or web address]  09/02/2016, 12:13 AM

## 2016-09-02 NOTE — Progress Notes (Signed)
PHARMACIST - PHYSICIAN ORDER COMMUNICATION  CONCERNING: P&T Medication Policy on Herbal Medications  DESCRIPTION:  This patient's order for:  melatonin  has been noted.  This product(s) is classified as an "herbal" or natural product. Due to a lack of definitive safety studies or FDA approval, nonstandard manufacturing practices, plus the potential risk of unknown drug-drug interactions while on inpatient medications, the Pharmacy and Therapeutics Committee does not permit the use of "herbal" or natural products of this type within [facility].   ACTION TAKEN: The pharmacy department is unable to verify this order at this time and your patient has been informed of this safety policy. Please reevaluate patient's clinical condition at discharge and address if the herbal or natural product(s) should be resumed at that time.   Thanks Dorrene German 09/02/2016 12:51 AM

## 2016-09-02 NOTE — Progress Notes (Signed)
PROGRESS NOTE  Kevin [name] KDX:[id number] DOB: March 18, 1941 DOA: 09/01/2016 PCP: Garret Reddish, MD   LOS: 1 day   Brief Narrative: 76 y/o male with history of hypertension, diastolic CHF, CKD Stage 3, carotid endarterectomy on Plavix was admitted from the emergency department on 09/02/16 for sepsis secondary to left lower lobe pneumonia. Patient presented to ED with fever/chills and productive cough x 2 days and was tachycardic and febrile with leukocytosis. He received IV fluids, Azithromycin, and Ceftriaxone while awaiting cultures and has improved significantly overnight since admission.  Assessment & Plan: Principal Problem:   Sepsis ([place]) Active Problems:   CAP (community acquired pneumonia)   CKD (chronic kidney disease), stage III   Hyperlipidemia   Essential hypertension   Chronic pain syndrome   Carotid artery stenosis s/p L carotid endarterectomy   AAA (abdominal aortic aneurysm, ruptured) ([facility])   Diastolic CHF ([place])  Sepsis from left lower lobe community acquired pneumonia - Patient is afebrile and not tachypnic or tachycardic. He has shown significant improvement overnight and will continue Azithromycin and Ceftriaxone.  - WBC still elevated at 14.9 today. Monitor leukocytes to ensure downwards trend with continued antibiotic use while awaiting blood culture and urine Legionella strep antigen results. - If patient continues to improve, remains afebrile, and leukocytosis resolves, patient can be considered for discharge tomorrow and follow up with outpatient CAP regimen.  Carotid artery stenosis s/o L carotid endarterectomy - Continue Plavix and statin therapy  Hypertension - Stable, continue Norvasc and Propranolol as prescribed.  - Medication history showed patient taking Bisoprolol and Propranolol, but per primary care provider A/P note on 07/05/16, Dr. [name] replaced Bisoprolol with Propranolol and Bisoprolol was documented as discontinued in patient's chart today.    Anemia of chronic disease. Hgb trending down 12.4 > 10.6 from 4/5-4/6 and is around patient's baseline. Hgb on 12/21/15 was 11.7. Continue to monitor CBC.  Chronic pain syndrome Well controlled on Percocet, Tizanidine, and Gabapentin. Continue as prescribed  CKD Stage III Patient is at baseline. Creatinine 2, BUN 25, GFR 30   Diastolic CHF Continue to hold Lasix 20 mg today and resume tomorrow as prescribed. BP stable at 125/52 and creatinine is baseline at 2.   History of AAA No abdominal pain, blood pressure well controlled.   Hyperlipidemia Continue statin therapy  DVT prophylaxis: Lovenox Code Status: Full Family Communication: No family in the room.  Disposition Plan: Home upon resolution of pneumonia   Consultants:   None  Procedures:   None  Antimicrobials:  Rocephin and Azithromycin 4/5 >>   Subjective: States he slept well overnight and denies fever/chills, chest pain, dyspnea, leg pain, nausea, vomiting, diarrhea. He is sitting comfortably in bed and ready to eat breakfast. States his cough has improved and is nonproductive. Essential tremor is still present in left hand but denies rigors.   Objective: Vitals:   09/02/16 0030 09/02/16 0100 09/02/16 0130 09/02/16 0600  BP:  (!) 159/66  (!) 125/52  Pulse:  89  65  Resp:  (!) 23  20  Temp: 99.6 F (37.6 C) 100 F (37.8 C)  99.3 F (37.4 C)  TempSrc: Oral Oral  Tympanic  SpO2:  98% 96% 95%  Weight:  88.5 kg (195 lb 1.7 oz)    Height:  5\' 10"  (1.778 m)      Intake/Output Summary (Last 24 hours) at 09/02/16 1209 Last data filed at 09/02/16 0730  Gross per 24 hour  Intake  3871.67 ml  Output                0 ml  Net          3871.67 ml   Filed Weights   09/01/16 2127 09/02/16 0100  Weight: 88.5 kg (195 lb) 88.5 kg (195 lb 1.7 oz)    Examination: Constitutional: NAD Vitals:   09/02/16 0030 09/02/16 0100 09/02/16 0130 09/02/16 0600  BP:  (!) 159/66  (!) 125/52  Pulse:  89  65  Resp:   (!) 23  20  Temp: 99.6 F (37.6 C) 100 F (37.8 C)  99.3 F (37.4 C)  TempSrc: Oral Oral  Tympanic  SpO2:  98% 96% 95%  Weight:  88.5 kg (195 lb 1.7 oz)    Height:  5\' 10"  (1.778 m)     Eyes:  lids and conjunctivae normal ENMT: Mucous membranes are moist.  Neck: normal, supple Respiratory: clear to auscultation bilaterally, no wheezing, no crackles. Normal respiratory effort. No accessory muscle use.  Cardiovascular: Regular rate and rhythm, no murmurs / rubs / gallops. No LE edema. Abdomen: no tenderness. Bowel sounds positive.  Musculoskeletal: no clubbing / cyanosis. No joint deformity upper and lower extremities. No contractures. Normal muscle tone.  Skin: no rashes, lesions, ulcers. No induration Neurologic: Strength 5/5 in all 4.  Psychiatric: Normal judgment and insight. Alert and oriented x 3. Normal mood.    Data Reviewed: I have personally reviewed following labs and imaging studies  CBC:  Recent Labs Lab 09/01/16 2151 09/02/16 0823  WBC 10.9* 14.9*  NEUTROABS 8.2*  --   HGB 12.4* 10.6*  HCT 35.9* 31.8*  MCV 87.6 90.1  PLT 243 702   Basic Metabolic Panel:  Recent Labs Lab 09/01/16 2151 09/02/16 0823  NA 139 139  K 4.4 4.2  CL 106 111  CO2 25 23  GLUCOSE 136* 114*  BUN 31* 25*  CREATININE 2.22* 2.00*  CALCIUM 8.9 8.2*   GFR: Estimated Creatinine Clearance: 34.7 mL/min (A) (by C-G formula based on SCr of 2 mg/dL (H)). Liver Function Tests:  Recent Labs Lab 09/01/16 2151  AST 33  ALT 21  ALKPHOS 34*  BILITOT 0.6  PROT 7.3  ALBUMIN 4.3   Coagulation Profile:  Recent Labs Lab 09/01/16 2151  INR 1.06   Urine analysis:    Component Value Date/Time   COLORURINE YELLOW 09/01/2016 2356   APPEARANCEUR CLEAR 09/01/2016 2356   LABSPEC 1.011 09/01/2016 2356   PHURINE 7.0 09/01/2016 2356   GLUCOSEU NEGATIVE 09/01/2016 2356   GLUCOSEU NEGATIVE 09/24/2010 0758   HGBUR NEGATIVE 09/01/2016 2356   HGBUR negative 11/20/2008 0833   BILIRUBINUR  NEGATIVE 09/01/2016 2356   BILIRUBINUR n 12/21/2015 1036   KETONESUR NEGATIVE 09/01/2016 2356   PROTEINUR NEGATIVE 09/01/2016 2356   UROBILINOGEN 0.2 12/21/2015 1036   UROBILINOGEN 0.2 03/17/2015 1907   NITRITE NEGATIVE 09/01/2016 2356   LEUKOCYTESUR NEGATIVE 09/01/2016 2356    Radiology Studies: Dg Chest 2 View  Result Date: 09/01/2016 CLINICAL DATA:  Dyspnea for 2 hours. EXAM: CHEST  2 VIEW COMPARISON:  04/10/2015 FINDINGS: Left base opacity is new and may represent pneumonia. Right lung is clear. Pulmonary vasculature is normal. No pleural effusions. IMPRESSION: Left base consolidation, suspicious for pneumonia. Followup PA and lateral chest X-ray is recommended in 3-4 weeks following trial of antibiotic therapy to ensure resolution and exclude underlying malignancy. Electronically Signed   By: Andreas Newport M.D.   On: 09/01/2016 22:29    Scheduled Meds: .  amLODipine  10 mg Oral Daily  . atorvastatin  20 mg Oral Daily  . azithromycin  500 mg Oral QHS  . baclofen  10 mg Oral BID  . cefTRIAXone (ROCEPHIN)  IV  1 g Intravenous Q24H  . cholecalciferol  1,000 Units Oral Daily  . clopidogrel  75 mg Oral Daily  . enoxaparin (LOVENOX) injection  40 mg Subcutaneous Q24H  . [START ON 09/03/2016] feeding supplement (ENSURE ENLIVE)  237 mL Oral BID BM  . fenofibrate  160 mg Oral Daily  . gabapentin  300 mg Oral TID  . propranolol  40 mg Oral BID  . tamsulosin  0.4 mg Oral Daily  . vitamin B-12  1,000 mcg Oral Daily   Continuous Infusions: . sodium chloride 100 mL/hr at 09/02/16 0141   Time spent: 30 minutes  Donald Siva, PA-Student  Attending MD note  Patient was seen, examined,treatment plan was discussed with the PA-S  [facility].  I have personally reviewed the clinical findings, lab, imaging studies and management of this patient in detail. I agree with the documentation, as recorded by the PA-S and changes to above note were in bold green  Patient is 76 y.o. male with history  of hypertension, diastolic CHF, carotid endarterectomy, chronic kidney disease who was admitted to the hospital on 4/6 with sepsis due to pneumonia.  He was placed on ceftriaxone and azithromycin on admission.  On Exam: Gen. exam: Awake, alert, not in any distress Chest: Good air entry bilaterally, no rhonchi or rales CVS: S1-S2 regular, no murmurs Abdomen: Soft, nontender and nondistended Neurology: Non-focal Skin: No rash or lesions  Plan  Sepsis from pneumonia  -Clinically improved this morning, continue antibiotics, cultures have remained negative so far. -Strep pneumo negative, Legionella pending, influenza panel negative  Hypertension -Continue home medications  Carotid endarterectomy  -on Plavix and statins.  Chronic pain syndrome  -Continue home medication  Chronic kidney disease stage III  -Baseline creatinine anywhere from 1.8-2.3.  Currently at baseline.  Anemia probably from chronic disease  -follow CBC.  History of abdominal aortic aneurysm  -denies any abdominal pain.  Outpatient management  History of diastolic dysfunction per 2-D echo in January 2017  -sepsis physiology resolving, resumed Lasix to start tomorrow.   Rest as above  Elisavet Buehrer M. Cruzita Lederer, MD [facility] [phone]  If 7PM-7AM, please contact night-coverage [email or web address] Password [email or web address]    If 7PM-7AM, please contact night-coverage [email or web address] Password [email or web address] 09/02/2016, 12:09 PM

## 2016-09-03 DIAGNOSIS — E785 Hyperlipidemia, unspecified: Secondary | ICD-10-CM

## 2016-09-03 DIAGNOSIS — N183 Chronic kidney disease, stage 3 (moderate): Secondary | ICD-10-CM

## 2016-09-03 DIAGNOSIS — I10 Essential (primary) hypertension: Secondary | ICD-10-CM

## 2016-09-03 DIAGNOSIS — I5032 Chronic diastolic (congestive) heart failure: Secondary | ICD-10-CM

## 2016-09-03 DIAGNOSIS — J181 Lobar pneumonia, unspecified organism: Secondary | ICD-10-CM

## 2016-09-03 LAB — CBC
HEMATOCRIT: 31 % — AB (ref 39.0–52.0)
Hemoglobin: 10.6 g/dL — ABNORMAL LOW (ref 13.0–17.0)
MCH: 30.6 pg (ref 26.0–34.0)
MCHC: 34.2 g/dL (ref 30.0–36.0)
MCV: 89.6 fL (ref 78.0–100.0)
Platelets: 200 10*3/uL (ref 150–400)
RBC: 3.46 MIL/uL — ABNORMAL LOW (ref 4.22–5.81)
RDW: 14.1 % (ref 11.5–15.5)
WBC: 9.8 10*3/uL (ref 4.0–10.5)

## 2016-09-03 LAB — LEGIONELLA PNEUMOPHILA SEROGP 1 UR AG: L. pneumophila Serogp 1 Ur Ag: NEGATIVE

## 2016-09-03 LAB — HIV ANTIBODY (ROUTINE TESTING W REFLEX): HIV SCREEN 4TH GENERATION: NONREACTIVE

## 2016-09-03 MED ORDER — LEVOFLOXACIN 500 MG PO TABS: 500.0000 mg | ORAL_TABLET | ORAL | 0 refills | Status: DC

## 2016-09-03 NOTE — Discharge Summary (Signed)
Physician Discharge Summary  Kevin [name] LFY:[id number] DOB: 07/16/1940 DOA: 09/01/2016  PCP: Garret Reddish, MD  Admit date: 09/01/2016 Discharge date: 09/03/2016  Admitted From: home Disposition:  home  Recommendations for Outpatient Follow-up:  1. Follow up with PCP as scheduled in 5 days 2. Continue Levaquin for 3 additional doses  Home Health: None Equipment/Devices: None  Discharge Condition: Stable CODE STATUS: Full code Diet recommendation: Regular  HPI: Per Dr. Glyn Ade, Kevin [name] is a 76 y.o. male with history of hypertension, diastolic CHF, carotid endarterectomy, chronic kidney disease presents to the ER because of fever and chills. Patient has been having this symptom for last 2 days. Patient also has been having productive sputum. Patient states his sputum has been dark-colored at times yellow. Denies any recent travel or sick contacts. ED Course: In the ER patient is found to be tachycardic febrile and lab work showed leukocytosis. Chest x-ray shows features consistent with pneumonia and admitted for sepsis from pneumonia.   Hospital Course: Discharge Diagnoses:  Principal Problem:   Sepsis ([place]) Active Problems:   Hyperlipidemia   Essential hypertension   Chronic pain syndrome   Carotid artery stenosis s/p L carotid endarterectomy   CKD (chronic kidney disease), stage III   CAP (community acquired pneumonia)   Diastolic CHF ([place])   Sepsis from left lower lobe community acquired pneumonia -patient was admitted to the hospital with sepsis due to pneumonia, he was febrile to 1 1.6, tachycardic and had significant leukocytosis.  He was never hypoxic.  He was placed on IV antibiotics with ceftriaxone and azithromycin, he clinically improved, his fever and sepsis physiology resolved.  His white count normalized.  He returned to baseline, he was able to ambulate without any dyspnea, he was afebrile and his antibiotics were narrowed to Levaquin and he is to continue  that for 3 additional doses Q48h (6 additional days).  Blood cultures have remained negative (pending final), strep pneumo was negative, influenza was negative, Legionella is still pending at the time of discharge. Carotid artery stenosis s/o L carotid endarterectomy - Continue Plavix and statin therapy Hypertension - Stable, continue Norvasc and Propranolol as prescribed.  Anemia of chronic disease - stable Chronic pain syndrome -continue chronic home regimen CKD Stage III -Baseline creatinine anywhere from 1.8-2.3.  Currently at baseline. History of diastolic dysfunction per 2-D echo in January 2017 -currently euvolemic History of abdominal aortic aneurysm -denies any abdominal pain.  Outpatient management Hyperlipidemia -Continue statin therapy  Discharge Instructions   Allergies as of 09/03/2016   No Known Allergies     Medication List    STOP taking these medications   bisoprolol 10 MG tablet Commonly known as:  ZEBETA     TAKE these medications   acetaminophen 500 MG tablet Commonly known as:  TYLENOL Take 500 mg by mouth every 6 (six) hours as needed for mild pain.   amLODipine 10 MG tablet Commonly known as:  NORVASC Take 1 tablet by mouth  daily   atorvastatin 20 MG tablet Commonly known as:  LIPITOR Take 1 tablet by mouth  daily   baclofen 10 MG tablet Commonly known as:  LIORESAL Take 10 mg by mouth 2 (two) times daily.   clopidogrel 75 MG tablet Commonly known as:  PLAVIX TAKE 1 TABLET BY MOUTH  DAILY   diazepam 5 MG tablet Commonly known as:  VALIUM Take 0.5-1 tablets (2.5-5 mg total) by mouth every 8 (eight) hours as needed for muscle spasms or sedation.  fenofibrate 160 MG tablet TAKE 1 TABLET BY MOUTH  DAILY   furosemide 20 MG tablet Commonly known as:  LASIX Take 1 tablet (20 mg total) by mouth every other day.   gabapentin 300 MG capsule Commonly known as:  NEURONTIN TAKE 1 CAPSULE BY MOUTH TWO TIMES DAILY   levofloxacin 500 MG  tablet Commonly known as:  LEVAQUIN Take 1 tablet (500 mg total) by mouth every other day.   Melatonin 3 MG Tabs Take 5 mg by mouth at bedtime.   oxyCODONE-acetaminophen 5-325 MG tablet Commonly known as:  PERCOCET/ROXICET Take 1 tablet by mouth every 8 (eight) hours as needed for moderate pain.   propranolol 40 MG tablet Commonly known as:  INDERAL Take 1 tablet (40 mg total) by mouth 2 (two) times daily.   tamsulosin 0.4 MG Caps capsule Commonly known as:  FLOMAX TAKE 1 CAPSULE BY MOUTH  DAILY   tiZANidine 4 MG tablet Commonly known as:  ZANAFLEX TAKE 1 TABLET BY MOUTH  EVERY 6 HOURS AS NEEDED FOR MUSCLE SPASMS   vitamin B-12 1000 MCG tablet Commonly known as:  CYANOCOBALAMIN Take 1,000 mcg by mouth daily.   Vitamin D-3 1000 units Caps Take 1 capsule by mouth daily.      Follow-up Information    Garret Reddish, MD. Schedule an appointment as soon as possible for a visit in 2 week(s).   Specialty:  Family Medicine Contact information: [place] [address] [phone]          No Known Allergies  Consultations:  None   Procedures/Studies:  Dg Chest 2 View  Result Date: 09/01/2016 CLINICAL DATA:  Dyspnea for 2 hours. EXAM: CHEST  2 VIEW COMPARISON:  04/10/2015 FINDINGS: Left base opacity is new and may represent pneumonia. Right lung is clear. Pulmonary vasculature is normal. No pleural effusions. IMPRESSION: Left base consolidation, suspicious for pneumonia. Followup PA and lateral chest X-ray is recommended in 3-4 weeks following trial of antibiotic therapy to ensure resolution and exclude underlying malignancy. Electronically Signed   By: Andreas Newport M.D.   On: 09/01/2016 22:29   Subjective: - no chest pain, shortness of breath, no abdominal pain, nausea or vomiting. Asking to go home   Discharge Exam: Vitals:   09/02/16 2112 09/03/16 0627  BP: (!) 177/73 (!) 144/57  Pulse: 62 (!) 56  Resp: 20 20  Temp: 98.4 F (36.9 C)  98.6 F (37 C)   Vitals:   09/02/16 0600 09/02/16 1443 09/02/16 2112 09/03/16 0627  BP: (!) 125/52 (!) 136/54 (!) 177/73 (!) 144/57  Pulse: 65 60 62 (!) 56  Resp: 20 20 20 20   Temp: 99.3 F (37.4 C) 98.5 F (36.9 C) 98.4 F (36.9 C) 98.6 F (37 C)  TempSrc: Tympanic Oral Oral Oral  SpO2: 95% 97% 98% 97%  Weight:      Height:        General: Pt is alert, awake, not in acute distress, slight essential tremor noted Cardiovascular: RRR, S1/S2 +, no rubs, no gallops Respiratory: CTA bilaterally, no wheezing, no rhonchi Abdominal: Soft, NT, ND, bowel sounds + Extremities: no edema, no cyanosis    The results of significant diagnostics from this hospitalization (including imaging, microbiology, ancillary and laboratory) are listed below for reference.     Microbiology: Recent Results (from the past 240 hour(s))  Culture, blood (Routine x 2)     Status: None (Preliminary result)   Collection Time: 09/01/16  9:51 PM  Result Value Ref Range  Status   Specimen Description BLOOD LEFT ARM  Final   Special Requests   Final    BOTTLES DRAWN AEROBIC AND ANAEROBIC Blood Culture adequate volume   Culture   Final    NO GROWTH < 24 HOURS Performed at [facility], [place] [address] [place], [place] [address]    Report Status PENDING  Incomplete  Culture, blood (Routine x 2)     Status: None (Preliminary result)   Collection Time: 09/01/16  9:54 PM  Result Value Ref Range Status   Specimen Description BLOOD RIGHT WRIST  Final   Special Requests   Final    BOTTLES DRAWN AEROBIC AND ANAEROBIC Blood Culture adequate volume   Culture   Final    NO GROWTH < 24 HOURS Performed at [facility], [place] [address] [place], [place] [address]    Report Status PENDING  Incomplete     Labs: BNP (last 3 results) No results for input(s): BNP in the last 8760 hours. Basic Metabolic Panel:  Recent Labs Lab 09/01/16 2151 09/02/16 0823  NA 139 139  K 4.4 4.2  CL 106 111  CO2 25 23   GLUCOSE 136* 114*  BUN 31* 25*  CREATININE 2.22* 2.00*  CALCIUM 8.9 8.2*   Liver Function Tests:  Recent Labs Lab 09/01/16 2151  AST 33  ALT 21  ALKPHOS 34*  BILITOT 0.6  PROT 7.3  ALBUMIN 4.3   No results for input(s): LIPASE, AMYLASE in the last 168 hours. No results for input(s): AMMONIA in the last 168 hours. CBC:  Recent Labs Lab 09/01/16 2151 09/02/16 0823 09/03/16 0613  WBC 10.9* 14.9* 9.8  NEUTROABS 8.2*  --   --   HGB 12.4* 10.6* 10.6*  HCT 35.9* 31.8* 31.0*  MCV 87.6 90.1 89.6  PLT 243 232 200   Cardiac Enzymes: No results for input(s): CKTOTAL, CKMB, CKMBINDEX, TROPONINI in the last 168 hours. BNP: Invalid input(s): POCBNP CBG: No results for input(s): GLUCAP in the last 168 hours. D-Dimer No results for input(s): DDIMER in the last 72 hours. Hgb A1c No results for input(s): HGBA1C in the last 72 hours. Lipid Profile No results for input(s): CHOL, HDL, LDLCALC, TRIG, CHOLHDL, LDLDIRECT in the last 72 hours. Thyroid function studies No results for input(s): TSH, T4TOTAL, T3FREE, THYROIDAB in the last 72 hours.  Invalid input(s): FREET3 Anemia work up No results for input(s): VITAMINB12, FOLATE, FERRITIN, TIBC, IRON, RETICCTPCT in the last 72 hours. Urinalysis    Component Value Date/Time   COLORURINE YELLOW 09/01/2016 2356   APPEARANCEUR CLEAR 09/01/2016 2356   LABSPEC 1.011 09/01/2016 2356   PHURINE 7.0 09/01/2016 2356   GLUCOSEU NEGATIVE 09/01/2016 2356   GLUCOSEU NEGATIVE 09/24/2010 0758   HGBUR NEGATIVE 09/01/2016 2356   HGBUR negative 11/20/2008 0833   BILIRUBINUR NEGATIVE 09/01/2016 2356   BILIRUBINUR n 12/21/2015 1036   KETONESUR NEGATIVE 09/01/2016 2356   PROTEINUR NEGATIVE 09/01/2016 2356   UROBILINOGEN 0.2 12/21/2015 1036   UROBILINOGEN 0.2 03/17/2015 1907   NITRITE NEGATIVE 09/01/2016 2356   LEUKOCYTESUR NEGATIVE 09/01/2016 2356   Sepsis Labs Invalid input(s): PROCALCITONIN,  WBC,  LACTICIDVEN Microbiology Recent Results  (from the past 240 hour(s))  Culture, blood (Routine x 2)     Status: None (Preliminary result)   Collection Time: 09/01/16  9:51 PM  Result Value Ref Range Status   Specimen Description BLOOD LEFT ARM  Final   Special Requests   Final    BOTTLES DRAWN AEROBIC AND ANAEROBIC  Blood Culture adequate volume   Culture   Final    NO GROWTH < 24 HOURS Performed at [facility], [place] [address], [place], [place] [address]    Report Status PENDING  Incomplete  Culture, blood (Routine x 2)     Status: None (Preliminary result)   Collection Time: 09/01/16  9:54 PM  Result Value Ref Range Status   Specimen Description BLOOD RIGHT WRIST  Final   Special Requests   Final    BOTTLES DRAWN AEROBIC AND ANAEROBIC Blood Culture adequate volume   Culture   Final    NO GROWTH < 24 HOURS Performed at [facility], [place] [address], [place], [place] [address]    Report Status PENDING  Incomplete     Time coordinating discharge: 35 minutes  SIGNED:  Marzetta Board, MD  [facility] 09/03/2016, 11:56 AM Pager [phone]  If 7PM-7AM, please contact night-coverage [email or web address] Password [email or web address]

## 2016-09-03 NOTE — Discharge Instructions (Signed)
Follow with Garret Reddish, MD in 2 weeks  Please get a complete blood count and chemistry panel checked by your Primary MD at your next visit, and again as instructed by your Primary MD. Please get your medications reviewed and adjusted by your Primary MD.  Please request your Primary MD to go over all Hospital Tests and Procedure/Radiological results at the follow up, please get all Hospital records sent to your Prim MD by signing hospital release before you go home.  If you had Pneumonia of Lung problems at the Hospital: Please get a 2 view Chest X ray done in 6-8 weeks after hospital discharge or sooner if instructed by your Primary MD.  If you have Congestive Heart Failure: Please call your Cardiologist or Primary MD anytime you have any of the following symptoms:  1) 3 pound weight gain in 24 hours or 5 pounds in 1 week  2) shortness of breath, with or without a dry hacking cough  3) swelling in the hands, feet or stomach  4) if you have to sleep on extra pillows at night in order to breathe  Follow cardiac low salt diet and 1.5 lit/day fluid restriction.  If you have diabetes Accuchecks 4 times/day, Once in AM empty stomach and then before each meal. Log in all results and show them to your primary doctor at your next visit. If any glucose reading is under 80 or above 300 call your primary MD immediately.  If you have Seizure/Convulsions/Epilepsy: Please do not drive, operate heavy machinery, participate in activities at heights or participate in high speed sports until you have seen by Primary MD or a Neurologist and advised to do so again.  If you had Gastrointestinal Bleeding: Please ask your Primary MD to check a complete blood count within one week of discharge or at your next visit. Your endoscopic/colonoscopic biopsies that are pending at the time of discharge, will also need to followed by your Primary MD.  Get Medicines reviewed and adjusted. Please take all your  medications with you for your next visit with your Primary MD  Please request your Primary MD to go over all hospital tests and procedure/radiological results at the follow up, please ask your Primary MD to get all Hospital records sent to his/her office.  If you experience worsening of your admission symptoms, develop shortness of breath, life threatening emergency, suicidal or homicidal thoughts you must seek medical attention immediately by calling 911 or calling your MD immediately  if symptoms less severe.  You must read complete instructions/literature along with all the possible adverse reactions/side effects for all the Medicines you take and that have been prescribed to you. Take any new Medicines after you have completely understood and accpet all the possible adverse reactions/side effects.   Do not drive or operate heavy machinery when taking Pain medications.   Do not take more than prescribed Pain, Sleep and Anxiety Medications  Special Instructions: If you have smoked or chewed Tobacco  in the last 2 yrs please stop smoking, stop any regular Alcohol  and or any Recreational drug use.  Wear Seat belts while driving.  Please note You were cared for by a hospitalist during your hospital stay. If you have any questions about your discharge medications or the care you received while you were in the hospital after you are discharged, you can call the unit and asked to speak with the hospitalist on call if the hospitalist that took care of you is not available. Once  you are discharged, your primary care physician will handle any further medical issues. Please note that NO REFILLS for any discharge medications will be authorized once you are discharged, as it is imperative that you return to your primary care physician (or establish a relationship with a primary care physician if you do not have one) for your aftercare needs so that they can reassess your need for medications and monitor your  lab values.  You can reach the hospitalist office at phone [phone] or fax [phone]   If you do not have a primary care physician, you can call [phone] for a physician referral.  Activity: As tolerated with Full fall precautions use walker/cane & assistance as needed  Diet: regular  Disposition Home

## 2016-09-03 NOTE — Progress Notes (Signed)
Patient discharged home.  Leaving with personal belongings and prescription.  Patient and wife report understanding of discharge instructions.  Room air, denies pain, no s/s of distress.  Accompanied by wife.  No complaints.

## 2016-09-06 ENCOUNTER — Telehealth: Payer: Self-pay

## 2016-09-06 LAB — CULTURE, BLOOD (ROUTINE X 2)
CULTURE: NO GROWTH
Culture: NO GROWTH
Special Requests: ADEQUATE
Special Requests: ADEQUATE

## 2016-09-06 NOTE — Telephone Encounter (Signed)
D/C 09/03/16 To: home  Spoke with pt and he states that he is doing well. He does still have a morning productive cough, which he reports is normal for him. He does not have any questions or concerns at this time.  Appt scheduled 09/08/16 with Dr. [name], pt aware.    Transition Care Management Follow-up Telephone Call  How have you been since you were released from the hospital? Very good, feels much better   Do you understand why you were in the hospital? yes   Do you understand the discharge instrcutions? yes  Items Reviewed:  Medications reviewed: yes  Allergies reviewed: yes  Dietary changes reviewed: yes  Referrals reviewed: yes   Functional Questionnaire:   Activities of Daily Living (ADLs):   He states they are independent in the following: ambulation, bathing and hygiene, feeding, continence, grooming, toileting and dressing States they require assistance with the following: none   Any transportation issues/concerns?: no   Any patient concerns? no   Confirmed importance and date/time of follow-up visits scheduled: yes   Confirmed with patient if condition begins to worsen call PCP or go to the ER.  Patient was given the Call-a-Nurse line [phone]: yes

## 2016-09-06 NOTE — Telephone Encounter (Signed)
noted thanks  

## 2016-09-08 ENCOUNTER — Ambulatory Visit (INDEPENDENT_AMBULATORY_CARE_PROVIDER_SITE_OTHER): Payer: Medicare Other | Admitting: Family Medicine

## 2016-09-08 ENCOUNTER — Encounter: Payer: Self-pay | Admitting: Family Medicine

## 2016-09-08 VITALS — BP 124/62 | HR 55 | Temp 97.9°F | Ht 70.0 in | Wt 191.4 lb

## 2016-09-08 DIAGNOSIS — Z0000 Encounter for general adult medical examination without abnormal findings: Secondary | ICD-10-CM | POA: Diagnosis not present

## 2016-09-08 DIAGNOSIS — J189 Pneumonia, unspecified organism: Secondary | ICD-10-CM

## 2016-09-08 DIAGNOSIS — I10 Essential (primary) hypertension: Secondary | ICD-10-CM | POA: Diagnosis not present

## 2016-09-08 DIAGNOSIS — E785 Hyperlipidemia, unspecified: Secondary | ICD-10-CM

## 2016-09-08 DIAGNOSIS — N183 Chronic kidney disease, stage 3 unspecified: Secondary | ICD-10-CM

## 2016-09-08 DIAGNOSIS — I5032 Chronic diastolic (congestive) heart failure: Secondary | ICD-10-CM | POA: Diagnosis not present

## 2016-09-08 DIAGNOSIS — A419 Sepsis, unspecified organism: Secondary | ICD-10-CM | POA: Diagnosis not present

## 2016-09-08 LAB — CBC WITH DIFFERENTIAL/PLATELET
BASOS PCT: 1.4 % (ref 0.0–3.0)
Basophils Absolute: 0.1 10*3/uL (ref 0.0–0.1)
EOS ABS: 0.6 10*3/uL (ref 0.0–0.7)
Eosinophils Relative: 6 % — ABNORMAL HIGH (ref 0.0–5.0)
HEMATOCRIT: 37.7 % — AB (ref 39.0–52.0)
Hemoglobin: 12.6 g/dL — ABNORMAL LOW (ref 13.0–17.0)
LYMPHS ABS: 2.1 10*3/uL (ref 0.7–4.0)
LYMPHS PCT: 20.6 % (ref 12.0–46.0)
MCHC: 33.4 g/dL (ref 30.0–36.0)
MCV: 91.1 fl (ref 78.0–100.0)
MONO ABS: 0.7 10*3/uL (ref 0.1–1.0)
Monocytes Relative: 7 % (ref 3.0–12.0)
NEUTROS ABS: 6.5 10*3/uL (ref 1.4–7.7)
NEUTROS PCT: 65 % (ref 43.0–77.0)
Platelets: 286 10*3/uL (ref 150.0–400.0)
RBC: 4.13 Mil/uL — ABNORMAL LOW (ref 4.22–5.81)
RDW: 14.3 % (ref 11.5–15.5)
WBC: 10 10*3/uL (ref 4.0–10.5)

## 2016-09-08 LAB — COMPREHENSIVE METABOLIC PANEL
ALT: 15 U/L (ref 0–53)
AST: 21 U/L (ref 0–37)
Albumin: 4.2 g/dL (ref 3.5–5.2)
Alkaline Phosphatase: 32 U/L — ABNORMAL LOW (ref 39–117)
BUN: 29 mg/dL — ABNORMAL HIGH (ref 6–23)
CALCIUM: 9.4 mg/dL (ref 8.4–10.5)
CO2: 28 meq/L (ref 19–32)
CREATININE: 2.16 mg/dL — AB (ref 0.40–1.50)
Chloride: 102 mEq/L (ref 96–112)
GFR: 31.61 mL/min — ABNORMAL LOW (ref >60.00)
Glucose, Bld: 116 mg/dL — ABNORMAL HIGH (ref 70–99)
Potassium: 4.9 mEq/L (ref 3.5–5.1)
Sodium: 137 mEq/L (ref 135–145)
Total Bilirubin: 0.5 mg/dL (ref 0.2–1.2)
Total Protein: 7 g/dL (ref 6.0–8.3)

## 2016-09-08 NOTE — Progress Notes (Signed)
Pre visit review using our clinic review tool, if applicable. No additional management support is needed unless otherwise documented below in the visit note. 
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
## 2016-09-08 NOTE — Progress Notes (Signed)
Subjective:  Kevin [name] is a 76 y.o. year old very pleasant male patient who presents for transitional care management and hospital follow up for sepsis due to LLL CAP. Patient was hospitalized from 09/01/16 to 09/03/16. A TCM phone call was completed on 09/06/16. Medical complexity moderate   Patient presented with 2 days of fever, chills, productive sputum and was found to be tachycardic, febrile with leukocytosis and CXR showed left lower lobe pneumonia. He met sepsis criteria initially.   He was initially placed on ceftriaxone and azithromycin and improved clinically- vital signs stablized/sepsis resolved. WBC normalized. He began to be able to ambulate without assistance again. He was dischraged on levaquin for 6 additional days q 48 hours due to renal function. Blood cultures were negative. Strep pneumo was negative. Influenza was negative. Legionella was negative. I personally reviewed the x-ray and concur with left lower lobe pneumonia- we also discussed repeating in about a month given smoking history.   For chronic issues patient was continued on plavix and statin with his history of carotid artery stenosis /p L carotid endarterectomy   Hypertension was controlled on norvasc 90m and propranolol and lasix 248m Bisoprolol was stopped due to low HR  Anemia of chronic disease was stable- advised CBC outpatient.   CKD III was stable at baseline with creatinine in 1.8-2.3 range.   Diastolic CHF- euvolemic in hospital and continued on lasix 2072mHyperlipidemia- was continued on statin therapy with atorvastatin 49m32mast LDL was 73- largely reasonable. Also on fenofibrate  Chronic pain- maingained on gabapentin 300 mg BID and percocet through pain management. Was given #6 before discharge.   BPH maintained on flomax   See problem oriented charting as well ROS- some fatigue but improving. No shortness of breath or wheeze. Had a tinge of right sided chest pain but not exertional.    Past  Medical History-  Patient Active Problem List   Diagnosis Date Noted  . PAD (peripheral artery disease) (HCC)[place]/03/2016    Priority: High  . Diastolic CHF (HCC)[place]/081/[id number]: High  . CKD (chronic kidney disease), stage III 01/27/2014    Priority: High  . AAA (abdominal aortic aneurysm, ruptured) (HCC)[place]/21/2014    Priority: High  . Carotid artery stenosis s/p L carotid endarterectomy 01/12/2012    Priority: High  . History of CVA (cerebrovascular accident) 11/17/2011    Priority: High  . Chronic pain syndrome 12/23/2009    Priority: High  . Personal history of colonic polyps 05/15/2013    Priority: Medium  . Essential tremor 10/05/2009    Priority: Medium  . UNSPECIFIED ANEMIA 12/05/2008    Priority: Medium  . BPH (benign prostatic hyperplasia) 06/04/2007    Priority: Medium  . Fasting hyperglycemia 02/05/2007    Priority: Medium  . Hyperlipidemia 01/03/2007    Priority: Medium  . Essential hypertension 01/03/2007    Priority: Medium  . CAP (community acquired pneumonia) 03/17/2015    Priority: Low  . Family history of malignant neoplasm of gastrointestinal tract 05/15/2013    Priority: Low  . Inguinal hernia 05/01/2012    Priority: Low  . BURSITIS, HIP 12/21/2009    Priority: Low  . ACTINIC KERATOSIS, EAR, LEFT 03/09/2009    Priority: Low  . Osteoarthritis of left knee 11/02/2007    Priority: Low  . CONDUCTIVE HEARING LOSS BILATERAL 06/04/2007    Priority: Low    Medications- reviewed and updated  A medical reconciliation was performed comparing current medicines to hospital  discharge medications. Current Outpatient Prescriptions  Medication Sig Dispense Refill  . acetaminophen (TYLENOL) 500 MG tablet Take 500 mg by mouth every 6 (six) hours as needed for mild pain.    Marland Kitchen amLODipine (NORVASC) 10 MG tablet Take 1 tablet by mouth  daily 90 tablet 3  . atorvastatin (LIPITOR) 20 MG tablet Take 1 tablet by mouth  daily 90 tablet 3  . baclofen (LIORESAL) 10  MG tablet Take 10 mg by mouth 2 (two) times daily.    . Cholecalciferol (VITAMIN D-3) 1000 UNITS CAPS Take 1 capsule by mouth daily.    . clopidogrel (PLAVIX) 75 MG tablet TAKE 1 TABLET BY MOUTH  DAILY 90 tablet 2  . diazepam (VALIUM) 5 MG tablet Take 0.5-1 tablets (2.5-5 mg total) by mouth every 8 (eight) hours as needed for muscle spasms or sedation. 40 tablet 2  . fenofibrate 160 MG tablet TAKE 1 TABLET BY MOUTH  DAILY 90 tablet 1  . furosemide (LASIX) 20 MG tablet Take 1 tablet (20 mg total) by mouth every other day. 90 tablet 3  . gabapentin (NEURONTIN) 300 MG capsule TAKE 1 CAPSULE BY MOUTH TWO TIMES DAILY 180 capsule 1  . levofloxacin (LEVAQUIN) 500 MG tablet Take 1 tablet (500 mg total) by mouth every other day. 3 tablet 0  . Melatonin 3 MG TABS Take 5 mg by mouth at bedtime.     Marland Kitchen oxyCODONE-acetaminophen (PERCOCET/ROXICET) 5-325 MG tablet Take 1 tablet by mouth every 8 (eight) hours as needed for moderate pain. 6 tablet 0  . propranolol (INDERAL) 40 MG tablet Take 1 tablet (40 mg total) by mouth 2 (two) times daily. 180 tablet 3  . tamsulosin (FLOMAX) 0.4 MG CAPS capsule TAKE 1 CAPSULE BY MOUTH  DAILY 90 capsule 2  . tiZANidine (ZANAFLEX) 4 MG tablet TAKE 1 TABLET BY MOUTH  EVERY 6 HOURS AS NEEDED FOR MUSCLE SPASMS 90 tablet 1  . vitamin B-12 (CYANOCOBALAMIN) 1000 MCG tablet Take 1,000 mcg by mouth daily.     No current facility-administered medications for this visit.     Objective: BP 124/62   Pulse (!) 55   Temp 97.9 F (36.6 C) (Oral)   Ht _0  (1.778 m)   Wt 191 lb 6.4 oz (86.8 kg)   SpO2 93%   BMI 27.46 kg/m  Gen: NAD, resting comfortably Mucous membranes are moist. Oropharynx normal. Nares normal CV: RRR no murmurs rubs or gallops Lungs: CTAB no crackles, wheeze, rhonchi Abdomen: soft/nontender/nondistended/normal bowel sounds. No rebound or guarding.  Ext: no edema Skin: warm, dry Neuro: grossly normal, moves all extremities  Assessment/Plan:  CAP- resolving  on levaquin 59m- to finish course. Repeat x-ray in 4 weeks. Sepsis resolved with treatment of underlying pneumonia Carotid artery stenosis- continued on plavix/atorvastatin Hypertension- continue norvasc 167m propranolol, and lasix 2046maily.  BP Readings from Last 3 Encounters:  09/08/16 124/62  09/03/16 (!) 144/57  08/04/16 (!) [id number] CHF- maintain lasix 66m36mily appears euvolemic CKD III- Stable update BMP HLD- continue atorvastatin 66mg65m fenofibrate Chronic pain- continue pain management visits with narcotic use as well as gabapentin. Percocet 3x a day but still up to 6/10 pain- to return to pain management BPH- continue flomax  Orders Placed This Encounter  Procedures  . DG Chest 2 View    Standing Status:   Future    Standing Expiration Date:   11/08/2017    Order Specific Question:   Reason for Exam (SYMPTOM  OR DIAGNOSIS REQUIRED)  Answer:   follow up pneumonia    Order Specific Question:   Preferred imaging location?    Answer:   [facility]  . CBC with Differential/Platelet  . Comprehensive metabolic panel    [place]   Return precautions advised.  Garret Reddish, MD

## 2016-09-08 NOTE — Patient Instructions (Addendum)
Please stop by lab before you go  Please go to [facility] in 1 month  - located [address] across the [address] from [place] - in the basement - Hours: 8:30-5:30 PM M-F. Do not need appointment.   Thrilled you have sprung back so quickly! Make sure to increase activity slightly each day as fatigue allows  Please finish all of the levaquin   Kevin [name] , Thank you for taking time to come for your Medicare Wellness Visit. I appreciate your ongoing commitment to your health goals. Please review the following plan we discussed and let me know if I can assist you in the future.   These are the goals we discussed: Goals    . to develop           May discuss with MD regarding pool exercise or upper body exercise with wife.        This is a list of the screening recommended for you and due dates:  Health Maintenance  Topic Date Due  . Flu Shot  12/28/2016  . Tetanus Vaccine  02/04/2017  . Colon Cancer Screening  06/20/2018  . Pneumonia vaccines  Completed   Prevention of falls: Remove rugs or any tripping hazards in the home Use Non slip mats in bathtubs and showers Placing grab bars next to the toilet and or shower Placing handrails on both sides of the stair way Adding extra lighting in the home.   Personal safety issues reviewed:  1. Consider starting a community watch program per [facility] 2.  Changes batteries is smoke detector and/or carbon monoxide detector  3.  If you have firearms; keep them in a safe place 4.  Wear protection when in the sun; Always wear sunscreen or a hat; It is good to have your doctor check your skin annually or review any new areas of concern 5. Driving safety; Keep in the right lane; stay 3 car lengths behind the car in front of you on the highway; look 3 times prior to pulling out; carry your cell phone everywhere you go!    Learn about the Yellow Dot program:  The program allows first responders at your emergency to have  access to who your physician is, as well as your medications and medical conditions.  Citizens requesting the Yellow Dot Packages should contact Master Corporal Nunzio Cobbs at the [facility] [phone] for the first week of the program and beginning the week after Easter citizens should contact their Scientist, physiological.     Health Maintenance, Male A healthy lifestyle and preventive care is important for your health and wellness. Ask your health care provider about what schedule of regular examinations is right for you. What should I know about weight and diet?  Eat a Healthy Diet  Eat plenty of vegetables, fruits, whole grains, low-fat dairy products, and lean protein.  Do not eat a lot of foods high in solid fats, added sugars, or salt. Maintain a Healthy Weight  Regular exercise can help you achieve or maintain a healthy weight. You should:  Do at least 150 minutes of exercise each week. The exercise should increase your heart rate and make you sweat (moderate-intensity exercise).  Do strength-training exercises at least twice a week. Watch Your Levels of Cholesterol and Blood Lipids  Have your blood tested for lipids and cholesterol every 5 years starting at 78 years of age. If you are at high risk for heart disease,  you should start having your blood tested when you are 78 years old. You may need to have your cholesterol levels checked more often if:  Your lipid or cholesterol levels are high.  You are older than 78 years of age.  You are at high risk for heart disease. What should I know about cancer screening? Many types of cancers can be detected early and may often be prevented. Lung Cancer  You should be screened every year for lung cancer if:  You are a current smoker who has smoked for at least 30 years.  You are a former smoker who has quit within the past 15 years.  Talk to your health care provider about your screening  options, when you should start screening, and how often you should be screened. Colorectal Cancer  Routine colorectal cancer screening usually begins at 78 years of age and should be repeated every 5-10 years until you are 78 years old. You may need to be screened more often if early forms of precancerous polyps or small growths are found. Your health care provider may recommend screening at an earlier age if you have risk factors for colon cancer.  Your health care provider may recommend using home test kits to check for hidden blood in the stool.  A small camera at the end of a tube can be used to examine your colon (sigmoidoscopy or colonoscopy). This checks for the earliest forms of colorectal cancer. Prostate and Testicular Cancer  Depending on your age and overall health, your health care provider may do certain tests to screen for prostate and testicular cancer.  Talk to your health care provider about any symptoms or concerns you have about testicular or prostate cancer. Skin Cancer  Check your skin from head to toe regularly.  Tell your health care provider about any new moles or changes in moles, especially if:  There is a change in a mole's size, shape, or color.  You have a mole that is larger than a pencil eraser.  Always use sunscreen. Apply sunscreen liberally and repeat throughout the day.  Protect yourself by wearing long sleeves, pants, a wide-brimmed hat, and sunglasses when outside. What should I know about heart disease, diabetes, and high blood pressure?  If you are 79-74 years of age, have your blood pressure checked every 3-5 years. If you are 76 years of age or older, have your blood pressure checked every year. You should have your blood pressure measured twice-once when you are at a hospital or clinic, and once when you are not at a hospital or clinic. Record the average of the two measurements. To check your blood pressure when you are not at a hospital or  clinic, you can use:  An automated blood pressure machine at a pharmacy.  A home blood pressure monitor.  Talk to your health care provider about your target blood pressure.  If you are between 67-65 years old, ask your health care provider if you should take aspirin to prevent heart disease.  Have regular diabetes screenings by checking your fasting blood sugar level.  If you are at a normal weight and have a low risk for diabetes, have this test once every three years after the age of 89.  If you are overweight and have a high risk for diabetes, consider being tested at a younger age or more often.  A one-time screening for abdominal aortic aneurysm (AAA) by ultrasound is recommended for men aged 38-75 years who are current  or former smokers. What should I know about preventing infection? Hepatitis B  If you have a higher risk for hepatitis B, you should be screened for this virus. Talk with your health care provider to find out if you are at risk for hepatitis B infection. Hepatitis C  Blood testing is recommended for:  Everyone born from [birth year] through 1967.  Anyone with known risk factors for hepatitis C. Sexually Transmitted Diseases (STDs)  You should be screened each year for STDs including gonorrhea and chlamydia if:  You are sexually active and are younger than 78 years of age.  You are older than 78 years of age and your health care provider tells you that you are at risk for this type of infection.  Your sexual activity has changed since you were last screened and you are at an increased risk for chlamydia or gonorrhea. Ask your health care provider if you are at risk.  Talk with your health care provider about whether you are at high risk of being infected with HIV. Your health care provider may recommend a prescription medicine to help prevent HIV infection. What else can I do?  Schedule regular health, dental, and eye exams.  Stay current with your vaccines  (immunizations).  Do not use any tobacco products, such as cigarettes, chewing tobacco, and e-cigarettes. If you need help quitting, ask your health care provider.  Limit alcohol intake to no more than 2 drinks per day. One drink equals 12 ounces of beer, 5 ounces of wine, or 1 ounces of hard liquor.  Do not use street drugs.  Do not share needles.  Ask your health care provider for help if you need support or information about quitting drugs.  Tell your health care provider if you often feel depressed.  Tell your health care provider if you have ever been abused or do not feel safe at home. This information is not intended to replace advice given to you by your health care provider. Make sure you discuss any questions you have with your health care provider. Document Released: 11/12/2007 Document Revised: 01/13/2016 Document Reviewed: 02/17/2015 Elsevier Interactive Patient Education  2017 [place] Prevention in the Home Falls can cause injuries and can affect people from all age groups. There are many simple things that you can do to make your home safe and to help prevent falls. What can I do on the outside of my home?  Regularly repair the edges of walkways and driveways and fix any cracks.  Remove high doorway thresholds.  Trim any shrubbery on the main path into your home.  Use bright outdoor lighting.  Clear walkways of debris and clutter, including tools and rocks.  Regularly check that handrails are securely fastened and in good repair. Both sides of any steps should have handrails.  Install guardrails along the edges of any raised decks or porches.  Have leaves, snow, and ice cleared regularly.  Use sand or salt on walkways during winter months.  In the garage, clean up any spills right away, including grease or oil spills. What can I do in the bathroom?  Use night lights.  Install grab bars by the toilet and in the tub and shower. Do not use towel  bars as grab bars.  Use non-skid mats or decals on the floor of the tub or shower.  If you need to sit down while you are in the shower, use a plastic, non-slip stool.  Keep the floor dry. Immediately clean  up any water that spills on the floor.  Remove soap buildup in the tub or shower on a regular basis.  Attach bath mats securely with double-sided non-slip rug tape.  Remove throw rugs and other tripping hazards from the floor. What can I do in the bedroom?  Use night lights.  Make sure that a bedside light is easy to reach.  Do not use oversized bedding that drapes onto the floor.  Have a firm chair that has side arms to use for getting dressed.  Remove throw rugs and other tripping hazards from the floor. What can I do in the kitchen?  Clean up any spills right away.  Avoid walking on wet floors.  Place frequently used items in easy-to-reach places.  If you need to reach for something above you, use a sturdy step stool that has a grab bar.  Keep electrical cables out of the way.  Do not use floor polish or wax that makes floors slippery. If you have to use wax, make sure that it is non-skid floor wax.  Remove throw rugs and other tripping hazards from the floor. What can I do in the stairways?  Do not leave any items on the stairs.  Make sure that there are handrails on both sides of the stairs. Fix handrails that are broken or loose. Make sure that handrails are as long as the stairways.  Check any carpeting to make sure that it is firmly attached to the stairs. Fix any carpet that is loose or worn.  Avoid having throw rugs at the top or bottom of stairways, or secure the rugs with carpet tape to prevent them from moving.  Make sure that you have a light switch at the top of the stairs and the bottom of the stairs. If you do not have them, have them installed. What are some other fall prevention tips?  Wear closed-toe shoes that fit well and support your feet.  Wear shoes that have rubber soles or low heels.  When you use a stepladder, make sure that it is completely opened and that the sides are firmly locked. Have someone hold the ladder while you are using it. Do not climb a closed stepladder.  Add color or contrast paint or tape to grab bars and handrails in your home. Place contrasting color strips on the first and last steps.  Use mobility aids as needed, such as canes, walkers, scooters, and crutches.  Turn on lights if it is dark. Replace any light bulbs that burn out.  Set up furniture so that there are clear paths. Keep the furniture in the same spot.  Fix any uneven floor surfaces.  Choose a carpet design that does not hide the edge of steps of a stairway.  Be aware of any and all pets.  Review your medicines with your healthcare provider. Some medicines can cause dizziness or changes in blood pressure, which increase your risk of falling. Talk with your health care provider about other ways that you can decrease your risk of falls. This may include working with a physical therapist or trainer to improve your strength, balance, and endurance. This information is not intended to replace advice given to you by your health care provider. Make sure you discuss any questions you have with your health care provider. Document Released: 05/06/2002 Document Revised: 10/13/2015 Document Reviewed: 06/20/2014 Elsevier Interactive Patient Education  2017 [employer].

## 2016-09-19 ENCOUNTER — Other Ambulatory Visit: Payer: Self-pay | Admitting: Family Medicine

## 2016-09-21 ENCOUNTER — Other Ambulatory Visit: Payer: Self-pay

## 2016-09-21 MED ORDER — DIAZEPAM 5 MG PO TABS: 2.5000 mg | ORAL_TABLET | Freq: Three times a day (TID) | ORAL | 2 refills | Status: DC | PRN

## 2016-09-21 MED ORDER — GABAPENTIN 300 MG PO CAPS: ORAL_CAPSULE | ORAL | 1 refills | Status: DC

## 2016-09-26 DIAGNOSIS — M179 Osteoarthritis of knee, unspecified: Secondary | ICD-10-CM | POA: Diagnosis not present

## 2016-09-26 DIAGNOSIS — Z79899 Other long term (current) drug therapy: Secondary | ICD-10-CM | POA: Diagnosis not present

## 2016-09-26 DIAGNOSIS — M25469 Effusion, unspecified knee: Secondary | ICD-10-CM | POA: Diagnosis not present

## 2016-09-26 DIAGNOSIS — Z79891 Long term (current) use of opiate analgesic: Secondary | ICD-10-CM | POA: Diagnosis not present

## 2016-09-26 DIAGNOSIS — M5136 Other intervertebral disc degeneration, lumbar region: Secondary | ICD-10-CM | POA: Diagnosis not present

## 2016-09-26 DIAGNOSIS — M47817 Spondylosis without myelopathy or radiculopathy, lumbosacral region: Secondary | ICD-10-CM | POA: Diagnosis not present

## 2016-09-26 DIAGNOSIS — G894 Chronic pain syndrome: Secondary | ICD-10-CM | POA: Diagnosis not present

## 2016-10-06 ENCOUNTER — Ambulatory Visit (INDEPENDENT_AMBULATORY_CARE_PROVIDER_SITE_OTHER)
Admission: RE | Admit: 2016-10-06 | Discharge: 2016-10-06 | Disposition: A | Discharge status: Home / Self Care | Payer: Medicare Other | Source: Ambulatory Visit | Attending: Family Medicine | Admitting: Family Medicine

## 2016-10-06 DIAGNOSIS — J189 Pneumonia, unspecified organism: Secondary | ICD-10-CM

## 2016-10-11 NOTE — Telephone Encounter (Signed)
Called to see if pt wanted to schedule awv - left message.  

## 2016-10-13 ENCOUNTER — Telehealth: Payer: Self-pay | Admitting: Family Medicine

## 2016-10-13 NOTE — Telephone Encounter (Signed)
° ° °  Pt call to say [facility] did not received the below med that was sent in April. Can this med be resent   diazepam (VALIUM) 5 MG tablet     [facility]

## 2016-10-13 NOTE — Telephone Encounter (Signed)
Prescription refaxed as requested.

## 2016-10-25 DIAGNOSIS — M5136 Other intervertebral disc degeneration, lumbar region: Secondary | ICD-10-CM | POA: Diagnosis not present

## 2016-10-25 DIAGNOSIS — Z79891 Long term (current) use of opiate analgesic: Secondary | ICD-10-CM | POA: Diagnosis not present

## 2016-10-25 DIAGNOSIS — G894 Chronic pain syndrome: Secondary | ICD-10-CM | POA: Diagnosis not present

## 2016-10-25 DIAGNOSIS — Z79899 Other long term (current) drug therapy: Secondary | ICD-10-CM | POA: Diagnosis not present

## 2016-10-25 DIAGNOSIS — M179 Osteoarthritis of knee, unspecified: Secondary | ICD-10-CM | POA: Diagnosis not present

## 2016-10-25 DIAGNOSIS — M47816 Spondylosis without myelopathy or radiculopathy, lumbar region: Secondary | ICD-10-CM | POA: Diagnosis not present

## 2016-10-27 ENCOUNTER — Ambulatory Visit (INDEPENDENT_AMBULATORY_CARE_PROVIDER_SITE_OTHER): Payer: Medicare Other | Admitting: Family Medicine

## 2016-10-27 ENCOUNTER — Encounter: Payer: Self-pay | Admitting: Family Medicine

## 2016-10-27 VITALS — BP 128/62 | HR 50 | Temp 97.8°F | Ht 70.0 in | Wt 193.6 lb

## 2016-10-27 DIAGNOSIS — I713 Abdominal aortic aneurysm, ruptured, unspecified: Secondary | ICD-10-CM

## 2016-10-27 DIAGNOSIS — J069 Acute upper respiratory infection, unspecified: Secondary | ICD-10-CM | POA: Diagnosis not present

## 2016-10-27 DIAGNOSIS — M1712 Unilateral primary osteoarthritis, left knee: Secondary | ICD-10-CM | POA: Diagnosis not present

## 2016-10-27 NOTE — Progress Notes (Signed)
Subjective:  Kevin [name] is a 76 y.o. year old very pleasant male patient who presents for/with See problem oriented charting ROS- no fever or chills. No nausea or vomiiting. Cough and congestion noted.    Past Medical History-  Patient Active Problem List   Diagnosis Date Noted  . PAD (peripheral artery disease) ([place]) 01/08/2016    Priority: High  . Diastolic CHF ([place]) 25/95/6387    Priority: High  . CKD (chronic kidney disease), stage III 01/27/2014    Priority: High  . AAA (abdominal aortic aneurysm, ruptured) ([place]) 01/17/2013    Priority: High  . Carotid artery stenosis s/p L carotid endarterectomy 01/12/2012    Priority: High  . History of CVA (cerebrovascular accident) 11/17/2011    Priority: High  . Chronic pain syndrome 12/23/2009    Priority: High  . Personal history of colonic polyps 05/15/2013    Priority: Medium  . Essential tremor 10/05/2009    Priority: Medium  . UNSPECIFIED ANEMIA 12/05/2008    Priority: Medium  . BPH (benign prostatic hyperplasia) 06/04/2007    Priority: Medium  . Fasting hyperglycemia 02/05/2007    Priority: Medium  . Hyperlipidemia 01/03/2007    Priority: Medium  . Essential hypertension 01/03/2007    Priority: Medium  . CAP (community acquired pneumonia) 03/17/2015    Priority: Low  . Family history of malignant neoplasm of gastrointestinal tract 05/15/2013    Priority: Low  . Inguinal hernia 05/01/2012    Priority: Low  . BURSITIS, HIP 12/21/2009    Priority: Low  . ACTINIC KERATOSIS, EAR, LEFT 03/09/2009    Priority: Low  . Osteoarthritis of left knee 11/02/2007    Priority: Low  . CONDUCTIVE HEARING LOSS BILATERAL 06/04/2007    Priority: Low    Medications- reviewed and updated Current Outpatient Prescriptions  Medication Sig Dispense Refill  . amLODipine (NORVASC) 10 MG tablet Take 1 tablet by mouth  daily 90 tablet 3  . atorvastatin (LIPITOR) 20 MG tablet Take 1 tablet by mouth  daily 90 tablet 3  . baclofen (LIORESAL)  10 MG tablet Take 10 mg by mouth 2 (two) times daily.    . Cholecalciferol (VITAMIN D-3) 1000 UNITS CAPS Take 1 capsule by mouth daily.    . clopidogrel (PLAVIX) 75 MG tablet TAKE 1 TABLET BY MOUTH  DAILY 90 tablet 2  . diazepam (VALIUM) 5 MG tablet Take 0.5-1 tablets (2.5-5 mg total) by mouth every 8 (eight) hours as needed for muscle spasms or sedation. 40 tablet 2  . fenofibrate 160 MG tablet TAKE 1 TABLET BY MOUTH  DAILY 90 tablet 1  . furosemide (LASIX) 20 MG tablet Take 1 tablet (20 mg total) by mouth every other day. 90 tablet 3  . gabapentin (NEURONTIN) 300 MG capsule TAKE 1 CAPSULE BY MOUTH TWO TIMES DAILY 180 capsule 1  . Melatonin 3 MG TABS Take 5 mg by mouth at bedtime.     Marland Kitchen oxyCODONE-acetaminophen (PERCOCET/ROXICET) 5-325 MG tablet Take 1 tablet by mouth every 8 (eight) hours as needed for moderate pain. 6 tablet 0  . propranolol (INDERAL) 40 MG tablet Take 1 tablet (40 mg total) by mouth 2 (two) times daily. 180 tablet 3  . tamsulosin (FLOMAX) 0.4 MG CAPS capsule TAKE 1 CAPSULE BY MOUTH  DAILY 90 capsule 2  . tiZANidine (ZANAFLEX) 4 MG tablet TAKE 1 TABLET BY MOUTH  EVERY 6 HOURS AS NEEDED FOR MUSCLE SPASMS 90 tablet 1  . vitamin B-12 (CYANOCOBALAMIN) 1000 MCG tablet Take 1,000 mcg  by mouth daily.     No current facility-administered medications for this visit.     Objective: BP 128/62 (BP Location: Left Arm, Patient Position: Sitting, Cuff Size: Large)   Pulse (!) 50   Temp 97.8 F (36.6 C) (Oral)   Ht 5\' 10"  (1.778 m)   Wt 193 lb 9.6 oz (87.8 kg)   SpO2 94%   BMI 27.78 kg/m  Gen: NAD, resting comfortably TM normal, turbinates erythematous with some yellow drainage, pharynx with some drainage and mildly erythematous CV: RRR other than mildly bradycardic.no murmurs rubs or gallops.  Lungs: CTAB no crackles, wheeze, rhonchi Ext: no edema Skin: warm, dry Neuro: grossly normal, moves all extremities   Assessment/Plan:  Acute URI S: 2-3 days ago started with some  cough. Last night didn't sleep well last night. No fever. No shortness of breath. Some clear nasal discharge yesterday- none today. Some dark colored sputum day before yesterday but now cleared and was in AM only. Overall he is feeling better each day  Early April titrated for pneumonia- was clearing on x-ray 3 weeks ago A/P: advised conservative home care for URI. He mainly wanted to make sure no pneumonia. Declines symptomatic prescriptive care. He is improving daily.   Osteoarthritis of left knee S: has been seeing pain management. Steroid Injections help some but only short term. Has also had fluid withdrawn. Wants to discuss option of viscous supplementation A/P: refer to sports medicine for viscous supplementation discussion. Please see notes from pain management under media tab for prior treatments.   AAA (abdominal aortic aneurysm, ruptured) ([place]) S/p Repair 2010. Attempted to change to history AAA rupture but code not found.   Orders Placed This Encounter  Procedures  . Ambulatory referral to Sports Medicine    Referral Priority:   Routine    Referral Type:   Consultation    Referred to Provider:   Gerda Diss, DO    Number of Visits Requested:   1   Return precautions advised. PRN follow up for acute URI and chronic OA knee.  Garret Reddish, MD

## 2016-10-27 NOTE — Patient Instructions (Signed)
We will call you within a week or two about your referral to Dr. Paulla Fore. If you do not hear within 3 weeks, give [place] a call.   Lungs are clear- worsening symptoms particularly fever, chills or shortness of breath- need to see you back to reevaluate.

## 2016-10-27 NOTE — Assessment & Plan Note (Signed)
S/p Repair 2010. Attempted to change to history AAA rupture but code not found.

## 2016-10-27 NOTE — Assessment & Plan Note (Addendum)
S: has been seeing pain management. Steroid Injections help some but only short term. Has also had fluid withdrawn. Wants to discuss option of viscous supplementation A/P: refer to sports medicine for viscous supplementation discussion. Please see notes from pain management under media tab for prior treatments.

## 2016-10-28 ENCOUNTER — Telehealth: Payer: Self-pay | Admitting: Family Medicine

## 2016-10-28 NOTE — Telephone Encounter (Signed)
Pharmacy need clarification on diazepam (VALIUM) 5 mg Reference # [id number]

## 2016-10-31 DIAGNOSIS — M47817 Spondylosis without myelopathy or radiculopathy, lumbosacral region: Secondary | ICD-10-CM | POA: Diagnosis not present

## 2016-11-01 NOTE — Telephone Encounter (Signed)
Called and spoke to pharmacy and clarified why patient was prescribed Valium

## 2016-11-02 ENCOUNTER — Ambulatory Visit: Payer: Self-pay

## 2016-11-02 ENCOUNTER — Ambulatory Visit (INDEPENDENT_AMBULATORY_CARE_PROVIDER_SITE_OTHER): Payer: Medicare Other | Admitting: Sports Medicine

## 2016-11-02 ENCOUNTER — Encounter: Payer: Self-pay | Admitting: Sports Medicine

## 2016-11-02 VITALS — BP 140/80 | HR 50 | Ht 67.0 in | Wt 194.4 lb

## 2016-11-02 DIAGNOSIS — M1712 Unilateral primary osteoarthritis, left knee: Secondary | ICD-10-CM | POA: Diagnosis not present

## 2016-11-02 NOTE — Patient Instructions (Addendum)
You can use the compression sleeve at any time throughout the day but is most important to use while being active as well as for 2 hours post-activity.   It is appropriate to ice following activity with the compression sleeve in place.  We are working on getting it approved for Orthovisc or Synvisc.  Both of these are a series of shots 1 week apart for 3 weeks.  We will be in touch regarding potential out of cost of these.

## 2016-11-02 NOTE — Assessment & Plan Note (Signed)
X-rays obtained previously do show bone-on-bone wear of the medial compartment.  We discussed that there are multiple conservative measures that we can try to perform short of total knee arthroplasty without is ultimately the definitive treatment for this condition.  Patient voices understanding.  We will then proceed with a cortisone injection today and have him follow-up in 4-6 weeks for consideration of Visco supplementation.  We will go ahead and get approval today for 3 shot series.  PROCEDURE NOTE - ULTRASOUND GUIDED ASPIRATION & INJECTION: Left knee Images were obtained and interpreted by myself, Teresa Coombs, DO  Images have been saved and stored to PACS system. Images obtained on: GE S7 Ultrasound machine  ULTRASOUND FINDINGS: Moderate effusion.  Advanced degenerative changes  DESCRIPTION OF PROCEDURE:  The patient's clinical condition is marked by substantial pain and/or significant functional disability. Other conservative therapy has not provided relief, is contraindicated, or not appropriate. There is a reasonable likelihood that injection will significantly improve the patient's pain and/or functional impairment. After discussing the risks, benefits and expected outcomes of the injection and all questions were reviewed and answered, the patient wished to undergo the above named procedure. Verbal consent was obtained. The ultrasound was used to identify the target structure and adjacent neurovascular structures. The skin was then prepped in sterile fashion and the target structure was injected under direct visualization using sterile technique as below: PREP: Alcohol, Ethel Chloride APPROACH: Superiolateral, stopcock technique, 21g 2" needle INJECTATE: 3cc 1% lidocaine, 2cc 0.5% marcaine, 2cc 40mg  DepoMedrol ASPIRATE: 20 cc of straw-colored viscous fluid DRESSING: Band-Aid and Body Helix compression sleeve full knee  Post procedural instructions including recommending icing and  warning signs for infection were reviewed. This procedure was well tolerated and there were no complications.   IMPRESSION: Succesful US Guided Aspiration & injection

## 2016-11-02 NOTE — Progress Notes (Signed)
OFFICE VISIT NOTE [name]. [place], [place] at [facility] [phone]  SELESTINO NILA - 76 y.o. male MRN [id number]  Date of birth: 02-28-1941  Visit Date: 11/02/2016  PCP: Marin Olp, MD   Referred by: Marin Olp, MD  Burlene Arnt, CMA acting as scribe for Dr. Paulla Fore.  SUBJECTIVE:   Chief Complaint  Patient presents with  . pain in left knee   HPI: As below and per problem based documentation when appropriate.  Pt presents today with complaint of pain in the left knee.  This has been a chronic problem for a couple of years. He had xray done 08/2015 and was diagnosed with osteoarthritis. He has received injections and had fluid drawn from the knee in the past. The pain is mostly anterior. Pain is described as an occasional stabbing sensation but constantly aches. Pain is rated 6/10 today but run about 6-8/10 on average. Pain is worse when walking and improves with rest. Pt does not take any medication for his knee but he does take Percocet and Gabapentin for his back. Pt does have a brace that he wears occasionally and does get some relief while walking when wearing the brace. He does have some pain in the right hip but doesn't feel like it is related to his knee.   Pt denies fever, chills, night sweats, unintentional weight loss or weight gain.     Review of Systems  Constitutional: Negative for chills and fever.  Respiratory: Positive for wheezing. Negative for shortness of breath.   Cardiovascular: Positive for leg swelling. Negative for chest pain and palpitations.  Musculoskeletal: Positive for joint pain. Negative for falls.  Neurological: Positive for dizziness. Negative for tingling and headaches.  Endo/Heme/Allergies: Bruises/bleeds easily (on blood thinner).    Otherwise per HPI.  HISTORY & PERTINENT PRIOR DATA:  No specialty comments available. He reports that he quit smoking about 29 years ago. His smoking use  included Cigarettes. He has a 56.00 pack-year smoking history. He has never used smokeless tobacco. No results for input(s): HGBA1C, LABURIC in the last 8760 hours. Medications & Allergies reviewed per EMR Patient Active Problem List   Diagnosis Date Noted  . PAD (peripheral artery disease) ([place]) 01/08/2016  . Diastolic CHF ([place]) 32/99/2426  . CAP (community acquired pneumonia) 03/17/2015  . CKD (chronic kidney disease), stage III 01/27/2014  . Family history of malignant neoplasm of gastrointestinal tract 05/15/2013  . Personal history of colonic polyps 05/15/2013  . AAA (abdominal aortic aneurysm, ruptured) ([place]) 01/17/2013  . Inguinal hernia 05/01/2012  . Carotid artery stenosis s/p L carotid endarterectomy 01/12/2012  . History of CVA (cerebrovascular accident) 11/17/2011  . Chronic pain syndrome 12/23/2009  . BURSITIS, HIP 12/21/2009  . Essential tremor 10/05/2009  . ACTINIC KERATOSIS, EAR, LEFT 03/09/2009  . UNSPECIFIED ANEMIA 12/05/2008  . Osteoarthritis of left knee 11/02/2007  . 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 BPH (benign prostatic hyperplasia) 06/04/2007  . Fasting hyperglycemia 02/05/2007  . Hyperlipidemia 01/03/2007  . Essential hypertension 01/03/2007   Past Medical History:  Diagnosis Date  . Arthritis    DDD- Lower Back  . Bradycardia   . CAP (community acquired pneumonia) 03/17/2015   "THAT'S WHAT THEY ARE THINKING; THEY ARE NOT SURE"  . Carotid artery occlusion   . Chronic lower back pain    DDD  . CKD (chronic kidney disease)    Dr. Corliss Parish  . Diverticulosis   . Enlarged prostate  takes Flomax daily  . History of blood transfusion    "probably when they did aortic aneurysm"  . History of colon polyps    benign  . Hyperlipidemia   . Hypertension    takes Amlodipine and Zebeta daily  . Insomnia    takes Melatonin nightly  . Joint pain   . 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     . Muscle spasm      takes Zanaflex daily as needed  . Peripheral edema    takes Furosemide daily  . Pneumonia    hx of   . Rheumatic fever 1946  . Rheumatoid arthritis ([place])   . Short-term memory loss    minimal  . Shortness of breath dyspnea    with exertion but lasts very short period of time  . TIA (transient ischemic attack)    x 2;takes Plavix daily  . Urinary frequency    takes Flomax daily  . Urinary urgency    Family History  Problem Relation Age of Onset  . Parkinsonism Father   . Dementia Father   . Cancer Father        Throat  . Colon cancer Mother        died in 49s  . Cancer Mother        Colon   Past Surgical History:  Procedure Laterality Date  . ABDOMINAL AORTIC ANEURYSM REPAIR  2010  . CAROTID ENDARTERECTOMY Left 09/20/2004  . COLONOSCOPY    . ENDARTERECTOMY Right 10/12/2015   Procedure: RIGHT CAROTID ENDARTERECTOMY WITH PATCH ANGIOPLASTY;  Surgeon: Elam Dutch, MD;  Location: [place];  Service: Vascular;  Laterality: Right;  . INGUINAL HERNIA REPAIR Left 02/05/2013   Procedure: HERNIA REPAIR INGUINAL ADULT;  Surgeon: Joyice Faster. Cornett, MD;  Location: [place];  Service: General;  Laterality: Left;  . INGUINAL HERNIA REPAIR Left   . INSERTION OF MESH Left 02/05/2013   Procedure: INSERTION OF MESH;  Surgeon: Joyice Faster. Cornett, MD;  Location: [place];  Service: General;  Laterality: Left;  . REFRACTIVE SURGERY     2016-2017  . SHOULDER ARTHROSCOPY WITH ROTATOR CUFF REPAIR AND SUBACROMIAL DECOMPRESSION Left 05/01/2014   Procedure: LEFT ARTHROSCOPY SHOULDER SUBACROMIAL DECOMPRESSION,DISTAL CLAVICAL RESECTION AND ROTATOR CUFF REPAIR;  Surgeon: Marin Shutter, MD;  Location: [place];  Service: Orthopedics;  Laterality: Left;  . TESTICLAR CYST EXCISION Left   . TONSILLECTOMY    . VASECTOMY     Social History   Occupational History  . Not on file.   Social History Main Topics  . Smoking status: Former Smoker    Packs/day: 2.00    Years:  28.00    Types: Cigarettes    Quit date: 05/31/1987  . Smokeless tobacco: Never Used     Comment: quit smoking "in my 33's"  . Alcohol use No  . Drug use: No     Comment: hx of-4-5 yrs ago  . Sexual activity: Not Currently    OBJECTIVE:  VS:  HT:5\' 7"  (170.2 cm)   WT:194 lb 6.4 oz (88.2 kg)  BMI:30.5    BP:140/80  HR:(!) 50bpm  TEMP: ( )  RESP:94 % EXAM: Findings:  WDWN, NAD, Non-toxic appearing Alert & appropriately interactive Not depressed or anxious appearing No increased work of breathing. Pupils are equal. EOM intact without nystagmus No clubbing or cyanosis of the extremities appreciated No significant rashes/lesions/ulcerations overlying the examined area. DP & PT pulses 2+/4.  No significant  pretibial edema. Sensation intact to light touch in lower extremities.  Left Knee: Generalized osteoarthritic bossing.  He has a slight flexor contracture. No significant varus or valgus deformity..   Moderate effusion.   ROM: 4 to 95.   Extensor mechanism intact No focal medial or lateral joint line tenderness but generalized discomfort..   Stable to varus/valgus strain & anterior/posterior drawer.  .   Pain with McMurray's which is minimal.  Thessaly deferred..     Prior x-rays of the left knee obtained in 2017 were reviewed independently today and show significant tricompartmental degenerative change with bone-on-bone changes within the medial compartment.     ASSESSMENT & PLAN:   Problem List Items Addressed This Visit    Osteoarthritis of left knee - Primary    X-rays obtained previously do show bone-on-bone wear of the medial compartment.  We discussed that there are multiple conservative measures that we can try to perform short of total knee arthroplasty without is ultimately the definitive treatment for this condition.  Patient voices understanding.  We will then proceed with a cortisone injection today and have him follow-up in 4-6 weeks for consideration of  Visco supplementation.  We will go ahead and get approval today for 3 shot series.  PROCEDURE NOTE - ULTRASOUND GUIDED ASPIRATION & INJECTION: Left knee Images were obtained and interpreted by myself, Teresa Coombs, DO  Images have been saved and stored to PACS system. Images obtained on: GE S7 Ultrasound machine  ULTRASOUND FINDINGS: Moderate effusion.  Advanced degenerative changes  DESCRIPTION OF PROCEDURE:  The patient's clinical condition is marked by substantial pain and/or significant functional disability. Other conservative therapy has not provided relief, is contraindicated, or not appropriate. There is a reasonable likelihood that injection will significantly improve the patient's pain and/or functional impairment. After discussing the risks, benefits and expected outcomes of the injection and all questions were reviewed and answered, the patient wished to undergo the above named procedure. Verbal consent was obtained. The ultrasound was used to identify the target structure and adjacent neurovascular structures. The skin was then prepped in sterile fashion and the target structure was injected under direct visualization using sterile technique as below: PREP: Alcohol, Ethel Chloride APPROACH: Superiolateral, stopcock technique, 21g 2" needle INJECTATE: 3cc 1% lidocaine, 2cc 0.5% marcaine, 2cc 40mg  DepoMedrol ASPIRATE: 20 cc of straw-colored viscous fluid DRESSING: Band-Aid and Body Helix compression sleeve full knee  Post procedural instructions including recommending icing and warning signs for infection were reviewed. This procedure was well tolerated and there were no complications.   IMPRESSION: Succesful US Guided Aspiration & injection        Relevant Orders   [place] LIMITED JOINT SPACE STRUCTURES LOW LEFT(NO LINKED CHARGES)      Follow-up: Return in about 6 weeks (around 12/14/2016).   CMA/ATC served as Education administrator during this visit. History, Physical, and Plan performed  by medical provider. Documentation and orders reviewed and attested to.      Teresa Coombs, [place] [facility] Physician

## 2016-11-07 ENCOUNTER — Other Ambulatory Visit: Payer: Self-pay | Admitting: Family Medicine

## 2016-11-14 DIAGNOSIS — M47817 Spondylosis without myelopathy or radiculopathy, lumbosacral region: Secondary | ICD-10-CM | POA: Diagnosis not present

## 2016-11-22 DIAGNOSIS — Z79899 Other long term (current) drug therapy: Secondary | ICD-10-CM | POA: Diagnosis not present

## 2016-11-22 DIAGNOSIS — M47816 Spondylosis without myelopathy or radiculopathy, lumbar region: Secondary | ICD-10-CM | POA: Diagnosis not present

## 2016-11-22 DIAGNOSIS — M179 Osteoarthritis of knee, unspecified: Secondary | ICD-10-CM | POA: Diagnosis not present

## 2016-11-22 DIAGNOSIS — M5136 Other intervertebral disc degeneration, lumbar region: Secondary | ICD-10-CM | POA: Diagnosis not present

## 2016-11-22 DIAGNOSIS — Z79891 Long term (current) use of opiate analgesic: Secondary | ICD-10-CM | POA: Diagnosis not present

## 2016-11-22 DIAGNOSIS — G894 Chronic pain syndrome: Secondary | ICD-10-CM | POA: Diagnosis not present

## 2016-11-23 ENCOUNTER — Encounter: Payer: Self-pay | Admitting: Family Medicine

## 2016-11-23 DIAGNOSIS — E785 Hyperlipidemia, unspecified: Secondary | ICD-10-CM | POA: Diagnosis not present

## 2016-11-23 DIAGNOSIS — N183 Chronic kidney disease, stage 3 (moderate): Secondary | ICD-10-CM | POA: Diagnosis not present

## 2016-11-23 DIAGNOSIS — R5383 Other fatigue: Secondary | ICD-10-CM | POA: Diagnosis not present

## 2016-11-23 DIAGNOSIS — I10 Essential (primary) hypertension: Secondary | ICD-10-CM | POA: Diagnosis not present

## 2016-12-06 ENCOUNTER — Ambulatory Visit (INDEPENDENT_AMBULATORY_CARE_PROVIDER_SITE_OTHER): Payer: Medicare Other | Admitting: Family Medicine

## 2016-12-06 ENCOUNTER — Encounter: Payer: Self-pay | Admitting: Family Medicine

## 2016-12-06 VITALS — BP 142/80 | HR 61 | Temp 98.1°F | Ht 67.0 in | Wt 190.2 lb

## 2016-12-06 DIAGNOSIS — B9689 Other specified bacterial agents as the cause of diseases classified elsewhere: Secondary | ICD-10-CM | POA: Diagnosis not present

## 2016-12-06 DIAGNOSIS — J40 Bronchitis, not specified as acute or chronic: Secondary | ICD-10-CM

## 2016-12-06 DIAGNOSIS — J019 Acute sinusitis, unspecified: Secondary | ICD-10-CM | POA: Diagnosis not present

## 2016-12-06 MED ORDER — AMOXICILLIN-POT CLAVULANATE 875-125 MG PO TABS: 1.0000 | ORAL_TABLET | Freq: Two times a day (BID) | ORAL | 0 refills | Status: DC

## 2016-12-06 NOTE — Patient Instructions (Addendum)
Sinsusitis and potential bronchitis Bacterial based on: Symptoms >10 days With 2-3 weeks of cough and congestion- also worry about bronchitis which would take 3-6 weeks to resolve and antibiotics unlikely to help  Treatment: -considered steroid: we opted out for now with no wheezing. With long term smoking history thorugh quit years ago could consider.  -other symptomatic care with mucinex -Antibiotic indicated: yes - would get x-ray if no better in 10 days. If that was clear for pneumonia could trial prednisone for post viral cough  Finally, we reviewed reasons to return to care including if symptoms worsen or persist or new concerns arise (particularly fever or shortness of breath)  Meds ordered this encounter  Medications  . amoxicillin-clavulanate (AUGMENTIN) 875-125 MG tablet    Sig: Take 1 tablet by mouth 2 (two) times daily.    Dispense:  14 tablet    Refill:  0

## 2016-12-06 NOTE — Progress Notes (Signed)
PCP: Marin Olp, MD  Subjective:  Kevin [name] is a 76 y.o. year old very pleasant male patient who presents with sinusitis symptoms including nasal congestion, sinus tenderness frontal, mixed clear and yellow drainage from nose as well as coughign this up -other symptoms include: fatigue, mild shortness of breath, occasional audible wheezing- even present today (upper airway) -day of illness:2-3 weeks, sinus pressure for 10 days -Symptoms are not improving -previous treatments: rest, hydration -sick contacts/travel/risks: denies  ROS-denies fever, SOB, NVD, tooth pain  Pertinent Past Medical History-  Patient Active Problem List   Diagnosis Date Noted  . PAD (peripheral artery disease) ([place]) 01/08/2016    Priority: High  . Diastolic CHF ([place]) 63/78/5885    Priority: High  . CKD (chronic kidney disease), stage III 01/27/2014    Priority: High  . AAA (abdominal aortic aneurysm, ruptured) ([place]) 01/17/2013    Priority: High  . Carotid artery stenosis s/p L carotid endarterectomy 01/12/2012    Priority: High  . History of CVA (cerebrovascular accident) 11/17/2011    Priority: High  . Chronic pain syndrome 12/23/2009    Priority: High  . Personal history of colonic polyps 05/15/2013    Priority: Medium  . Essential tremor 10/05/2009    Priority: Medium  . UNSPECIFIED ANEMIA 12/05/2008    Priority: Medium  . BPH (benign prostatic hyperplasia) 06/04/2007    Priority: Medium  . Fasting hyperglycemia 02/05/2007    Priority: Medium  . Hyperlipidemia 01/03/2007    Priority: Medium  . Essential hypertension 01/03/2007    Priority: Medium  . CAP (community acquired pneumonia) 03/17/2015    Priority: Low  . Family history of malignant neoplasm of gastrointestinal tract 05/15/2013    Priority: Low  . Inguinal hernia 05/01/2012    Priority: Low  . BURSITIS, HIP 12/21/2009    Priority: Low  . ACTINIC KERATOSIS, EAR, LEFT 03/09/2009    Priority: Low  . Osteoarthritis of left  knee 11/02/2007    Priority: Low  . CONDUCTIVE HEARING LOSS BILATERAL 06/04/2007    Priority: Low    Medications- reviewed  Current Outpatient Prescriptions  Medication Sig Dispense Refill  . amLODipine (NORVASC) 10 MG tablet Take 1 tablet by mouth  daily 90 tablet 3  . atorvastatin (LIPITOR) 20 MG tablet Take 1 tablet by mouth  daily 90 tablet 3  . Cholecalciferol (VITAMIN D-3) 1000 UNITS CAPS Take 1 capsule by mouth daily.    . clopidogrel (PLAVIX) 75 MG tablet TAKE 1 TABLET BY MOUTH  DAILY 90 tablet 2  . diazepam (VALIUM) 5 MG tablet Take 0.5-1 tablets (2.5-5 mg total) by mouth every 8 (eight) hours as needed for muscle spasms or sedation. 40 tablet 2  . fenofibrate 160 MG tablet TAKE 1 TABLET BY MOUTH  DAILY 90 tablet 3  . furosemide (LASIX) 20 MG tablet Take 1 tablet (20 mg total) by mouth every other day. 90 tablet 3  . gabapentin (NEURONTIN) 300 MG capsule TAKE 1 CAPSULE BY MOUTH TWO TIMES DAILY 180 capsule 1  . Melatonin 3 MG TABS Take 5 mg by mouth at bedtime.     Marland Kitchen oxyCODONE-acetaminophen (PERCOCET/ROXICET) 5-325 MG tablet Take 1 tablet by mouth every 8 (eight) hours as needed for moderate pain. 6 tablet 0  . propranolol (INDERAL) 40 MG tablet Take 1 tablet (40 mg total) by mouth 2 (two) times daily. 180 tablet 3  . tamsulosin (FLOMAX) 0.4 MG CAPS capsule TAKE 1 CAPSULE BY MOUTH  DAILY 90 capsule 2  .  tiZANidine (ZANAFLEX) 4 MG tablet TAKE 1 TABLET BY MOUTH  EVERY 6 HOURS AS NEEDED FOR MUSCLE SPASMS 90 tablet 1  . vitamin B-12 (CYANOCOBALAMIN) 1000 MCG tablet Take 1,000 mcg by mouth daily.     No current facility-administered medications for this visit.     Objective: BP (!) 142/80   Pulse 61   Temp 98.1 F (36.7 C) (Oral)   Ht 5\' 7"  (1.702 m)   Wt 190 lb 3.2 oz (86.3 kg)   SpO2 97%   BMI 29.79 kg/m  Gen: NAD, resting comfortably HEENT: Turbinates erythematous with yellow drainage, TM normal, pharynx mildly erythematous with no tonsilar exudate or edema, frontal sinus  tenderness CV: RRR no murmurs rubs or gallops Lungs: CTAB no crackles, wheeze, rhonchi Abdomen: soft/nontender/nondistended/normal bowel sounds. No rebound or guarding.  Ext: no edema Skin: warm, dry, no rash Neuro: grossly normal, moves all extremities  Assessment/Plan:  Sinsusitis and potential bronchitis Bacterial based on: Symptoms >10 days With 2-3 weeks of cough and congestion- also worry about bronchitis which would take 3-6 weeks to resolve and antibiotics unlikely to help Patient wanted to rule out pneumonia- lungs were clear on exam  Treatment: -considered steroid: we opted out for now with no wheezing. With long term smoking history thorugh quit years ago could consider.  -other symptomatic care with mucinex -Antibiotic indicated: yes - would get x-ray if no better in 10 days. If that was clear for pneumonia could trial prednisone for post viral cough  Finally, we reviewed reasons to return to care including if symptoms worsen or persist or new concerns arise (particularly fever or shortness of breath)  Meds ordered this encounter  Medications  . amoxicillin-clavulanate (AUGMENTIN) 875-125 MG tablet    Sig: Take 1 tablet by mouth 2 (two) times daily.    Dispense:  14 tablet    Refill:  0  high risk due to his age, diastolic CHF (though no signs of fluid overload), CKD III (GFR above 30 on last check)  Garret Reddish, MD

## 2016-12-14 ENCOUNTER — Ambulatory Visit (INDEPENDENT_AMBULATORY_CARE_PROVIDER_SITE_OTHER): Payer: Medicare Other | Admitting: Sports Medicine

## 2016-12-14 ENCOUNTER — Encounter: Payer: Self-pay | Admitting: Sports Medicine

## 2016-12-14 ENCOUNTER — Other Ambulatory Visit: Payer: Self-pay | Admitting: Family Medicine

## 2016-12-14 VITALS — BP 128/72 | HR 50 | Ht 67.0 in | Wt 195.0 lb

## 2016-12-14 DIAGNOSIS — G894 Chronic pain syndrome: Secondary | ICD-10-CM | POA: Diagnosis not present

## 2016-12-14 DIAGNOSIS — M1712 Unilateral primary osteoarthritis, left knee: Secondary | ICD-10-CM

## 2016-12-14 NOTE — Assessment & Plan Note (Signed)
Responded well to cortisone. Will consider visco in future if needed but will plan for re-eval in 6 months for repeat cortisone. Continue compression and avoidance of exacerbating activities while remaining as active as possible

## 2016-12-14 NOTE — Progress Notes (Signed)
OFFICE VISIT NOTE Kevin [name]. [place], [place] at [place] - 76 y.o. male MRN [id number]  Date of birth: Jun 06, 1940  Visit Date: 12/14/2016  PCP: Marin Olp, MD   Referred by: Marin Olp, MD  Burlene Arnt, CMA acting as scribe for Dr. Paulla Fore.  SUBJECTIVE:   Chief Complaint  Patient presents with  . Follow-up    osteoarthritis left knee   HPI: As below and per problem based documentation when appropriate.  Pt presents today for 6 week follow-up of osteoarthritis of the left knee. His last xray was 09/10/15. He had u/s guided injection and aspiration 11/02/16. He had a brace that he was wearing occasionally for his knee pain. Visco supplementation was discuss at his last visit if knee pain continued.   Pt reports improvement in knee pain since his last visit. The steroid injections really seemed to help with his knee pain. He is also getting relief from the Body Helix compression sleeve. He still has some pain when walking up stairs, worse if he starts with the left leg. He hasn't really noticed any swelling in the knee since he had it aspirated.   Pt denies fever, chills, night sweats.    Review of Systems  Constitutional: Negative for chills and fever.  Respiratory: Positive for shortness of breath. Negative for wheezing.   Cardiovascular: Negative for chest pain and palpitations.  Musculoskeletal: Positive for falls (10 days ago, bruised right side/ribs).  Neurological: Positive for dizziness. Negative for tingling and headaches.  Endo/Heme/Allergies: Does not bruise/bleed easily.    Otherwise per HPI.  HISTORY & PERTINENT PRIOR DATA:  No specialty comments available. He reports that he quit smoking about 29 years ago. His smoking use included Cigarettes. He has a 56.00 pack-year smoking history. He has never used smokeless tobacco. No results for input(s): HGBA1C, LABURIC in the last 8760  hours. Medications & Allergies reviewed per EMR Patient Active Problem List   Diagnosis Date Noted  . PAD (peripheral artery disease) ([place]) 01/08/2016  . Diastolic CHF ([place]) 75/17/0017  . CAP (community acquired pneumonia) 03/17/2015  . CKD (chronic kidney disease), stage III 01/27/2014  . Family history of malignant neoplasm of gastrointestinal tract 05/15/2013  . Personal history of colonic polyps 05/15/2013  . AAA (abdominal aortic aneurysm, ruptured) ([place]) 01/17/2013  . Inguinal hernia 05/01/2012  . Carotid artery stenosis s/p L carotid endarterectomy 01/12/2012  . History of CVA (cerebrovascular accident) 11/17/2011  . Chronic pain syndrome 12/23/2009  . BURSITIS, HIP 12/21/2009  . Essential tremor 10/05/2009  . ACTINIC KERATOSIS, EAR, LEFT 03/09/2009  . UNSPECIFIED ANEMIA 12/05/2008  . Osteoarthritis of left knee 11/02/2007  . 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 BPH (benign prostatic hyperplasia) 06/04/2007  . Fasting hyperglycemia 02/05/2007  . Hyperlipidemia 01/03/2007  . Essential hypertension 01/03/2007   Past Medical History:  Diagnosis Date  . Arthritis    DDD- Lower Back  . Bradycardia   . CAP (community acquired pneumonia) 03/17/2015   "THAT'S WHAT THEY ARE THINKING; THEY ARE NOT SURE"  . Carotid artery occlusion   . Chronic lower back pain    DDD  . CKD (chronic kidney disease)    Dr. Corliss Parish  . Diverticulosis   . Enlarged prostate    takes Flomax daily  . History of blood transfusion    "probably when they did aortic aneurysm"  . History of colon polyps    benign  .  Hyperlipidemia   . Hypertension    takes Amlodipine and Zebeta daily  . Insomnia    takes Melatonin nightly  . Joint pain   . 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     . Muscle spasm    takes Zanaflex daily as needed  . Peripheral edema    takes Furosemide daily  . Pneumonia    hx of   . Rheumatic fever 1946  . Rheumatoid arthritis  ([place])   . Short-term memory loss    minimal  . Shortness of breath dyspnea    with exertion but lasts very short period of time  . TIA (transient ischemic attack)    x 2;takes Plavix daily  . Urinary frequency    takes Flomax daily  . Urinary urgency    Family History  Problem Relation Age of Onset  . Parkinsonism Father   . Dementia Father   . Cancer Father        Throat  . Colon cancer Mother        died in 40s  . Cancer Mother        Colon   Past Surgical History:  Procedure Laterality Date  . ABDOMINAL AORTIC ANEURYSM REPAIR  2010  . CAROTID ENDARTERECTOMY Left 09/20/2004  . COLONOSCOPY    . ENDARTERECTOMY Right 10/12/2015   Procedure: RIGHT CAROTID ENDARTERECTOMY WITH PATCH ANGIOPLASTY;  Surgeon: Elam Dutch, MD;  Location: [facility];  Service: Vascular;  Laterality: Right;  . INGUINAL HERNIA REPAIR Left 02/05/2013   Procedure: HERNIA REPAIR INGUINAL ADULT;  Surgeon: Joyice Faster. Cornett, MD;  Location: [place];  Service: General;  Laterality: Left;  . INGUINAL HERNIA REPAIR Left   . INSERTION OF MESH Left 02/05/2013   Procedure: INSERTION OF MESH;  Surgeon: Joyice Faster. Cornett, MD;  Location: [place];  Service: General;  Laterality: Left;  . REFRACTIVE SURGERY     2016-2017  . SHOULDER ARTHROSCOPY WITH ROTATOR CUFF REPAIR AND SUBACROMIAL DECOMPRESSION Left 05/01/2014   Procedure: LEFT ARTHROSCOPY SHOULDER SUBACROMIAL DECOMPRESSION,DISTAL CLAVICAL RESECTION AND ROTATOR CUFF REPAIR;  Surgeon: Marin Shutter, MD;  Location: [place];  Service: Orthopedics;  Laterality: Left;  . TESTICLAR CYST EXCISION Left   . TONSILLECTOMY    . VASECTOMY     Social History   Occupational History  . Not on file.   Social History Main Topics  . Smoking status: Former Smoker    Packs/day: 2.00    Years: 28.00    Types: Cigarettes    Quit date: 05/31/1987  . Smokeless tobacco: Never Used     Comment: quit smoking "in my 14's"  . Alcohol use No  . Drug use:  No     Comment: hx of-4-5 yrs ago  . Sexual activity: Not Currently    OBJECTIVE:  VS:  HT:5\' 7"  (170.2 cm)   WT:195 lb (88.5 kg)  BMI:30.6    BP:128/72  HR:(!) 50bpm  TEMP: ( )  RESP:96 % EXAM: Findings:  WDWN, NAD, Non-toxic appearing Alert & appropriately interactive Not depressed or anxious appearing No increased work of breathing. Pupils are equal. EOM intact without nystagmus No clubbing or cyanosis of the extremities appreciated No significant rashes/lesions/ulcerations overlying the examined area. No significant pretibial edema. Sensation intact to light touch in lower extremities.   Bilateral Knee: Generalized osteoarthritic bossing L > R.   Small effusion on Left. None on Right ROM: full extension bilaterally.   Extensor mechanism intact No  significant medial or lateral joint line tenderness. Stable to varus/valgus strain & anterior/posterior drawer.           No results found. ASSESSMENT & PLAN:   Problem List Items Addressed This Visit    Osteoarthritis of left knee    Responded well to cortisone. Will consider visco in future if needed but will plan for re-eval in 6 months for repeat cortisone. Continue compression and avoidance of exacerbating activities while remaining as active as possible      Chronic pain syndrome - Primary      Follow-up: Return in about 6 months (around 06/16/2017).   CMA/ATC served as Education administrator during this visit. History, Physical, and Plan performed by medical provider. Documentation and orders reviewed and attested to.      Teresa Coombs, [place] [facility] Physician

## 2016-12-16 ENCOUNTER — Telehealth: Payer: Self-pay | Admitting: Family Medicine

## 2016-12-16 NOTE — Telephone Encounter (Signed)
Pt need new Rx for diazepam Pharm:  [facility]

## 2016-12-19 ENCOUNTER — Other Ambulatory Visit: Payer: Self-pay

## 2016-12-19 MED ORDER — DIAZEPAM 5 MG PO TABS: 2.5000 mg | ORAL_TABLET | Freq: Three times a day (TID) | ORAL | 2 refills | Status: DC | PRN

## 2016-12-19 NOTE — Telephone Encounter (Signed)
Called and spoke to pharmacist

## 2016-12-19 NOTE — Telephone Encounter (Signed)
[facility] is calling stating that they need verbal orders for pt diazepam  [phone]  Ref#  [id number]

## 2016-12-19 NOTE — Telephone Encounter (Signed)
[facility] calling to see if they can re-send the Rx diazepam the was sent out and it they were not able to deliver due to the pt not being at home and they have not rec'd it back but need to see it is okay to resend  [phone]  Ref# [id number].

## 2016-12-20 DIAGNOSIS — M5136 Other intervertebral disc degeneration, lumbar region: Secondary | ICD-10-CM | POA: Diagnosis not present

## 2016-12-20 DIAGNOSIS — M179 Osteoarthritis of knee, unspecified: Secondary | ICD-10-CM | POA: Diagnosis not present

## 2016-12-20 DIAGNOSIS — Z79899 Other long term (current) drug therapy: Secondary | ICD-10-CM | POA: Diagnosis not present

## 2016-12-20 DIAGNOSIS — G894 Chronic pain syndrome: Secondary | ICD-10-CM | POA: Diagnosis not present

## 2016-12-20 DIAGNOSIS — Z79891 Long term (current) use of opiate analgesic: Secondary | ICD-10-CM | POA: Diagnosis not present

## 2016-12-20 DIAGNOSIS — M25469 Effusion, unspecified knee: Secondary | ICD-10-CM | POA: Diagnosis not present

## 2016-12-20 NOTE — Telephone Encounter (Signed)
Called and spoke to pharmacist Judson Roch who stated they had shipped out a shipment to patient but they sent it to the wrong address. The postal service sent the meds back to the pharmacy. They needed permission to send it back to the patient.

## 2017-01-17 DIAGNOSIS — M25469 Effusion, unspecified knee: Secondary | ICD-10-CM | POA: Diagnosis not present

## 2017-01-17 DIAGNOSIS — M179 Osteoarthritis of knee, unspecified: Secondary | ICD-10-CM | POA: Diagnosis not present

## 2017-01-17 DIAGNOSIS — M5136 Other intervertebral disc degeneration, lumbar region: Secondary | ICD-10-CM | POA: Diagnosis not present

## 2017-01-17 DIAGNOSIS — G894 Chronic pain syndrome: Secondary | ICD-10-CM | POA: Diagnosis not present

## 2017-01-17 DIAGNOSIS — Z79899 Other long term (current) drug therapy: Secondary | ICD-10-CM | POA: Diagnosis not present

## 2017-01-17 DIAGNOSIS — Z79891 Long term (current) use of opiate analgesic: Secondary | ICD-10-CM | POA: Diagnosis not present

## 2017-02-01 ENCOUNTER — Other Ambulatory Visit: Payer: Self-pay | Admitting: Family Medicine

## 2017-02-14 DIAGNOSIS — M179 Osteoarthritis of knee, unspecified: Secondary | ICD-10-CM | POA: Diagnosis not present

## 2017-02-14 DIAGNOSIS — Z79891 Long term (current) use of opiate analgesic: Secondary | ICD-10-CM | POA: Diagnosis not present

## 2017-02-14 DIAGNOSIS — Z79899 Other long term (current) drug therapy: Secondary | ICD-10-CM | POA: Diagnosis not present

## 2017-02-14 DIAGNOSIS — G894 Chronic pain syndrome: Secondary | ICD-10-CM | POA: Diagnosis not present

## 2017-02-14 DIAGNOSIS — M5136 Other intervertebral disc degeneration, lumbar region: Secondary | ICD-10-CM | POA: Diagnosis not present

## 2017-02-14 DIAGNOSIS — M25469 Effusion, unspecified knee: Secondary | ICD-10-CM | POA: Diagnosis not present

## 2017-02-28 DIAGNOSIS — G894 Chronic pain syndrome: Secondary | ICD-10-CM | POA: Diagnosis not present

## 2017-02-28 DIAGNOSIS — M792 Neuralgia and neuritis, unspecified: Secondary | ICD-10-CM | POA: Diagnosis not present

## 2017-03-17 ENCOUNTER — Encounter: Payer: Self-pay | Admitting: Sports Medicine

## 2017-03-17 ENCOUNTER — Ambulatory Visit: Payer: Self-pay

## 2017-03-17 ENCOUNTER — Ambulatory Visit (INDEPENDENT_AMBULATORY_CARE_PROVIDER_SITE_OTHER): Payer: Medicare Other | Admitting: Sports Medicine

## 2017-03-17 VITALS — BP 138/68 | HR 52 | Ht 67.0 in | Wt 187.4 lb

## 2017-03-17 DIAGNOSIS — M47816 Spondylosis without myelopathy or radiculopathy, lumbar region: Secondary | ICD-10-CM | POA: Diagnosis not present

## 2017-03-17 DIAGNOSIS — G894 Chronic pain syndrome: Secondary | ICD-10-CM

## 2017-03-17 DIAGNOSIS — M1712 Unilateral primary osteoarthritis, left knee: Secondary | ICD-10-CM

## 2017-03-17 NOTE — Patient Instructions (Signed)

## 2017-03-17 NOTE — Progress Notes (Signed)
OFFICE VISIT NOTE [name]. Tarena Gockley, [place] [facility] [place] - 76 y.o. male MRN [id number]  Date of birth: [birth year]  Visit Date: 03/17/2017  PCP: Marin Olp, MD   Referred by: Marin Olp, MD  Thalia Bloodgood PT, LAT, ATC acting as scribe for Dr. Paulla Fore.  SUBJECTIVE:   Chief Complaint  Patient presents with  . Follow-up    B knee pain   HPI: As below and per problem based documentation when appropriate.  [name] is an established pt presenting today for f/u of his B knee pain.  He was last seen on 11/02/16 and had an injection in his L knee.  He was also prescribed a Body Helix knee sleeve.  He states that he feels like the injection did help w/ his L knee for a while but can't recall for how long it helped.  He states that he's experiencing aching L knee pain that he rates as a 3/10.  He also reports that he's been wearing the Body Helix sleeve fairly regularly and feels like it helps.    Pt states that he is most interested in discussing options for switching his care from pain management to [facility] for his chronic back pain.  Pt states that Dr. [name] had referred pt to pain management.  He states that's been OK but he would like to stay w/i the [facility] system and would like to discuss some other options.  Pt states that he has chronic LBP x approximately 4 years that radiates into his B hips.  He notes that he can no longer sleep normally due to the back and hip pain and now has to sleep in a recliner.    Review of Systems  Constitutional: Negative for chills, fever and weight loss.  HENT: Negative.   Eyes: Negative.   Respiratory: Positive for wheezing. Negative for cough and shortness of breath.   Cardiovascular: Positive for chest pain.  Gastrointestinal: Positive for heartburn. Negative for abdominal pain and nausea.  Genitourinary: Negative.   Musculoskeletal: Positive for back  pain, falls and joint pain.  Neurological: Positive for dizziness. Negative for tingling and headaches.  Endo/Heme/Allergies: Bruises/bleeds easily.  Psychiatric/Behavioral: Negative for depression. The patient is not nervous/anxious and does not have insomnia.     Otherwise per HPI.  HISTORY & PERTINENT PRIOR DATA:  No specialty comments available. He reports that he quit smoking about 29 years ago. His smoking use included Cigarettes. He has a 56.00 pack-year smoking history. He has never used smokeless tobacco. No results for input(s): HGBA1C, LABURIC in the last 8760 hours. Allergies reviewed per EMR Prior to Admission medications   Medication Sig Start Date End Date Taking? Authorizing Provider  amLODipine (NORVASC) 10 MG tablet TAKE 1 TABLET BY MOUTH  DAILY 12/14/16  Yes Marin Olp, MD  amoxicillin-clavulanate (AUGMENTIN) 875-125 MG tablet Take 1 tablet by mouth 2 (two) times daily. 12/06/16  Yes Marin Olp, MD  atorvastatin (LIPITOR) 20 MG tablet TAKE 1 TABLET BY MOUTH  DAILY 02/01/17  Yes Marin Olp, MD  Cholecalciferol (VITAMIN D-3) 1000 UNITS CAPS Take 1 capsule by mouth daily.   Yes [provider]  clopidogrel (PLAVIX) 75 MG tablet TAKE 1 TABLET BY MOUTH  DAILY 12/14/16  Yes Marin Olp, MD  diazepam (VALIUM) 5 MG tablet Take 0.5-1 tablets (2.5-5 mg total) by mouth every 8 (eight) hours as needed for muscle  spasms or sedation. 12/19/16  Yes Marin Olp, MD  fenofibrate 160 MG tablet TAKE 1 TABLET BY MOUTH  DAILY 11/07/16  Yes Marin Olp, MD  furosemide (LASIX) 20 MG tablet Take 1 tablet (20 mg total) by mouth every other day. 04/27/16  Yes Nafziger, Tommi Rumps, NP  gabapentin (NEURONTIN) 300 MG capsule TAKE 1 CAPSULE BY MOUTH TWO TIMES DAILY 09/21/16  Yes Marin Olp, MD  Melatonin 3 MG TABS Take 5 mg by mouth at bedtime.    Yes [provider]  oxyCODONE-acetaminophen (PERCOCET/ROXICET) 5-325 MG tablet Take 1 tablet by mouth every 8  (eight) hours as needed for moderate pain. 10/12/15  Yes Ulyses Amor, PA-C  propranolol (INDERAL) 40 MG tablet Take 1 tablet (40 mg total) by mouth 2 (two) times daily. 07/04/16  Yes Marin Olp, MD  tamsulosin (FLOMAX) 0.4 MG CAPS capsule TAKE 1 CAPSULE BY MOUTH  DAILY 03/25/16  Yes Marin Olp, MD  tiZANidine (ZANAFLEX) 4 MG tablet TAKE 1 TABLET BY MOUTH  EVERY 6 HOURS AS NEEDED FOR MUSCLE SPASMS 12/14/16  Yes Marin Olp, MD  vitamin B-12 (CYANOCOBALAMIN) 1000 MCG tablet Take 1,000 mcg by mouth daily.   Yes [provider]   Patient Active Problem List   Diagnosis Date Noted  . Lumbar spondylosis 03/17/2017  . PAD (peripheral artery disease) ([place]) 01/08/2016  . Diastolic CHF ([place]) 03/23/8526  . CAP (community acquired pneumonia) 03/17/2015  . CKD (chronic kidney disease), stage III ([place]) 01/27/2014  . Family history of malignant neoplasm of gastrointestinal tract 05/15/2013  . Personal history of colonic polyps 05/15/2013  . AAA (abdominal aortic aneurysm, ruptured) ([place]) 01/17/2013  . Inguinal hernia 05/01/2012  . Carotid artery stenosis s/p L carotid endarterectomy 01/12/2012  . History of CVA (cerebrovascular accident) 11/17/2011  . Chronic pain syndrome 12/23/2009  . BURSITIS, HIP 12/21/2009  . Essential tremor 10/05/2009  . ACTINIC KERATOSIS, EAR, LEFT 03/09/2009  . UNSPECIFIED ANEMIA 12/05/2008  . Osteoarthritis of left knee 11/02/2007  . 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 BPH (benign prostatic hyperplasia) 06/04/2007  . Fasting hyperglycemia 02/05/2007  . Hyperlipidemia 01/03/2007  . Essential hypertension 01/03/2007   Past Medical History:  Diagnosis Date  . Arthritis    DDD- Lower Back  . Bradycardia   . CAP (community acquired pneumonia) 03/17/2015   "THAT'S WHAT THEY ARE THINKING; THEY ARE NOT SURE"  . Carotid artery occlusion   . Chronic lower back pain    DDD  . CKD (chronic kidney disease)    Dr. Corliss Parish    . Diverticulosis   . Enlarged prostate    takes Flomax daily  . History of blood transfusion    "probably when they did aortic aneurysm"  . History of colon polyps    benign  . Hyperlipidemia   . Hypertension    takes Amlodipine and Zebeta daily  . Insomnia    takes Melatonin nightly  . Joint pain   . 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     . Muscle spasm    takes Zanaflex daily as needed  . Peripheral edema    takes Furosemide daily  . Pneumonia    hx of   . Rheumatic fever 1946  . Rheumatoid arthritis ([place])   . Short-term memory loss    minimal  . Shortness of breath dyspnea    with exertion but lasts very short period of time  . TIA (transient ischemic attack)  x 2;takes Plavix daily  . Urinary frequency    takes Flomax daily  . Urinary urgency    Family History  Problem Relation Age of Onset  . Parkinsonism Father   . Dementia Father   . Cancer Father        Throat  . Colon cancer Mother        died in 75s  . Cancer Mother        Colon   Past Surgical History:  Procedure Laterality Date  . ABDOMINAL AORTIC ANEURYSM REPAIR  2010  . CAROTID ENDARTERECTOMY Left 09/20/2004  . COLONOSCOPY    . ENDARTERECTOMY Right 10/12/2015   Procedure: RIGHT CAROTID ENDARTERECTOMY WITH PATCH ANGIOPLASTY;  Surgeon: Elam Dutch, MD;  Location: [facility];  Service: Vascular;  Laterality: Right;  . INGUINAL HERNIA REPAIR Left 02/05/2013   Procedure: HERNIA REPAIR INGUINAL ADULT;  Surgeon: Joyice Faster. Cornett, MD;  Location: [facility];  Service: General;  Laterality: Left;  . INGUINAL HERNIA REPAIR Left   . INSERTION OF MESH Left 02/05/2013   Procedure: INSERTION OF MESH;  Surgeon: Joyice Faster. Cornett, MD;  Location: [facility];  Service: General;  Laterality: Left;  . REFRACTIVE SURGERY     2016-2017  . SHOULDER ARTHROSCOPY WITH ROTATOR CUFF REPAIR AND SUBACROMIAL DECOMPRESSION Left 05/01/2014   Procedure: LEFT ARTHROSCOPY  SHOULDER SUBACROMIAL DECOMPRESSION,DISTAL CLAVICAL RESECTION AND ROTATOR CUFF REPAIR;  Surgeon: Marin Shutter, MD;  Location: [facility];  Service: Orthopedics;  Laterality: Left;  . TESTICLAR CYST EXCISION Left   . TONSILLECTOMY    . VASECTOMY     Social History   Occupational History  . Not on file.   Social History Main Topics  . Smoking status: Former Smoker    Packs/day: 2.00    Years: 28.00    Types: Cigarettes    Quit date: 05/31/1987  . Smokeless tobacco: Never Used     Comment: quit smoking "in my 57's"  . Alcohol use No  . Drug use: No     Comment: hx of-4-5 yrs ago  . Sexual activity: Not Currently    OBJECTIVE:  VS:  HT:5\' 7"  (170.2 cm)   WT:187 lb 6.4 oz (85 kg)  BMI:29.34    BP:138/68  HR:(!) 52bpm  TEMP: ( )  RESP:97 % EXAM: Findings:  Left knee overall well aligned.  Some generalized osteoarthritic bossing.  Small effusion with generalized synovitis.  Ligamentously is 3-4 mm opening with valgus and varus stressing but solid endpoints.  Pain with McMurray's.  No mechanical locking  Bilateral negative straight leg raises although tight hamstrings.  No radicular symptoms to his low back.    RADIOLOGY: US GUIDED NEEDLE PLACEMENT(NO LINKED CHARGES) Gerda Diss, DO     03/17/2017  9:57 AM PROCEDURE NOTE - ULTRASOUND GUIDED INJECTION: LEFT Knee Images were obtained and interpreted by myself, Teresa Coombs, DO   Images have been saved and stored to PACS system. Images obtained on: GE S7 Ultrasound machine  ULTRASOUND FINDINGS:  Generalized synovitis, small effusion  DESCRIPTION OF PROCEDURE:  The patient's clinical condition is marked by substantial pain  and/or significant functional disability. Other conservative  therapy has not provided relief, is contraindicated, or not  appropriate. There is a reasonable likelihood that injection will  significantly improve the patient's pain and/or functional  impairment. After discussing the risks, benefits  and expected  outcomes of the injection and all questions were reviewed and  answered, the patient wished to undergo the  above named  procedure. Verbal consent was obtained. The ultrasound was used  to identify the target structure and adjacent neurovascular  structures. The skin was then prepped in sterile fashion and the  target structure was injected under direct visualization using  sterile technique as below: PREP: Alcohol, Ethel Chloride APPROACH: Superiolateral, single injection, 25g 1.5" needle INJECTATE: 2cc 1% lidocaine, 2 cc 40mg  DepoMedrol ASPIRATE: N/A DRESSING: Band-Aid  Post procedural instructions including recommending icing and  warning signs for infection were reviewed. This procedure was  well tolerated and there were no complications.   IMPRESSION: Succesful US Guided Injection  ASSESSMENT & PLAN:     ICD-10-CM   1. Primary osteoarthritis of left knee M17.12 US GUIDED NEEDLE PLACEMENT(NO LINKED CHARGES)    Ambulatory referral to Physical Medicine Rehab  2. Chronic pain syndrome G89.4 Ambulatory referral to Physical Medicine Rehab  3. Lumbar spondylosis M47.816 Ambulatory referral to Physical Medicine Rehab   ================================================================= Osteoarthritis of left knee Repeat injection today.  Can consider Visco supplementation again.  Lumbar spondylosis Fairly significant lumbar spondylosis with degenerative changes reviewed on MRI.  No significant neurogenic symptoms at this time but could consider repeat injections in the future although he has had these in the past.  Chronic pain syndrome Patient is done well on chronic pain management.  He is interested in integrative medicine to the come systems that we are all available to share records and I have placed a referral to [facility] health medical group's physical medicine and pain rehab group.  Patient is aware of their limitations and restrictions regarding prescribing chronic  pain medicines and he is willing to adhere to their policies guidelines.   he is seemingly very appropriate for chronic pain management.  PROCEDURE NOTE - ULTRASOUND GUIDED INJECTION: LEFT Knee Images were obtained and interpreted by myself, Teresa Coombs, DO  Images have been saved and stored to PACS system. Images obtained on: GE S7 Ultrasound machine  ULTRASOUND FINDINGS:  Generalized synovitis, small effusion  DESCRIPTION OF PROCEDURE:  The patient's clinical condition is marked by substantial pain and/or significant functional disability. Other conservative therapy has not provided relief, is contraindicated, or not appropriate. There is a reasonable likelihood that injection will significantly improve the patient's pain and/or functional impairment. After discussing the risks, benefits and expected outcomes of the injection and all questions were reviewed and answered, the patient wished to undergo the above named procedure. Verbal consent was obtained. The ultrasound was used to identify the target structure and adjacent neurovascular structures. The skin was then prepped in sterile fashion and the target structure was injected under direct visualization using sterile technique as below: PREP: Alcohol, Ethel Chloride APPROACH: Superiolateral, single injection, 25g 1.5" needle INJECTATE: 2cc 1% lidocaine, 2 cc 40mg  DepoMedrol ASPIRATE: N/A DRESSING: Band-Aid  Post procedural instructions including recommending icing and warning signs for infection were reviewed. This procedure was well tolerated and there were no complications.   IMPRESSION: Succesful US Guided Injection   ================================================================= Patient Instructions  You had an injection today.  Things to be aware of after injection are listed below: . You may experience no significant improvement or even a slight worsening in your symptoms during the first 24 to 48 hours.  After that we  expect your symptoms to improve gradually over the next 2 weeks for the medicine to have its maximal effect.  You should continue to have improvement out to 6 weeks after your injection. . Dr. Paulla Fore recommends icing the site of the injection for 20  minutes  1-2 times the day of your injection . You may shower but no swimming, tub bath or Jacuzzi for 24 hours. . If your bandage falls off this does not need to be replaced.  It is appropriate to remove the bandage after 4 hours. . You may resume light activities as tolerated unless otherwise directed per Dr. Paulla Fore during your visit  POSSIBLE STEROID SIDE EFFECTS:  Side effects from injectable steroids tend to be less than when taken orally however you may experience some of the symptoms listed below.  If experienced these should only last for a short period of time. Change in menstrual flow  Edema (swelling)  Increased appetite Skin flushing (redness)  Skin rash/acne  Thrush (oral) Yeast vaginitis    Increased sweating  Depression Increased blood glucose levels Cramping and leg/calf  Euphoria (feeling happy)  POSSIBLE PROCEDURE SIDE EFFECTS: The side effects of the injection are usually fairly minimal however if you may experience some of the following side effects that are usually self-limited and will is off on their own.  If you are concerned please feel free to call the office with questions:  Increased numbness or tingling  Nausea or vomiting  Swelling or bruising at the injection site   Please call our office if if you experience any of the following symptoms over the next week as these can be signs of infection:   Fever greater than 100.52F  Significant swelling at the injection site  Significant redness or drainage from the injection site  If after 2 weeks you are continuing to have worsening symptoms please call our office to discuss what the next appropriate actions should be including the potential for a return office visit or other  diagnostic testing.     ================================================================= Future Appointments Date Time Provider [place]  06/16/2017 9:00 AM Gerda Diss, DO [facility] None    Follow-up: Return in about 3 months (around 06/17/2017).   CMA/ATC served as Education administrator during this visit. History, Physical, and Plan performed by medical provider. Documentation and orders reviewed and attested to.      Teresa Coombs, [facility] Physician

## 2017-03-17 NOTE — Procedures (Signed)
PROCEDURE NOTE - ULTRASOUND GUIDED INJECTION: LEFT Knee Images were obtained and interpreted by myself, Teresa Coombs, DO  Images have been saved and stored to PACS system. Images obtained on: GE S7 Ultrasound machine  ULTRASOUND FINDINGS:  Generalized synovitis, small effusion  DESCRIPTION OF PROCEDURE:  The patient's clinical condition is marked by substantial pain and/or significant functional disability. Other conservative therapy has not provided relief, is contraindicated, or not appropriate. There is a reasonable likelihood that injection will significantly improve the patient's pain and/or functional impairment. After discussing the risks, benefits and expected outcomes of the injection and all questions were reviewed and answered, the patient wished to undergo the above named procedure. Verbal consent was obtained. The ultrasound was used to identify the target structure and adjacent neurovascular structures. The skin was then prepped in sterile fashion and the target structure was injected under direct visualization using sterile technique as below: PREP: Alcohol, Ethel Chloride APPROACH: Superiolateral, single injection, 25g 1.5" needle INJECTATE: 2cc 1% lidocaine, 2 cc 40mg  DepoMedrol ASPIRATE: N/A DRESSING: Band-Aid  Post procedural instructions including recommending icing and warning signs for infection were reviewed. This procedure was well tolerated and there were no complications.   IMPRESSION: Succesful US Guided Injection

## 2017-03-19 NOTE — Assessment & Plan Note (Signed)
Fairly significant lumbar spondylosis with degenerative changes reviewed on MRI.  No significant neurogenic symptoms at this time but could consider repeat injections in the future although he has had these in the past.

## 2017-03-19 NOTE — Assessment & Plan Note (Signed)
Patient is done well on chronic pain management.  He is interested in integrative medicine to the come systems that we are all available to share records and I have placed a referral to [facility] health medical group's physical medicine and pain rehab group.  Patient is aware of their limitations and restrictions regarding prescribing chronic pain medicines and he is willing to adhere to their policies guidelines.   he is seemingly very appropriate for chronic pain management.

## 2017-03-19 NOTE — Assessment & Plan Note (Signed)
Repeat injection today.  Can consider Visco supplementation again.

## 2017-03-21 DIAGNOSIS — M5136 Other intervertebral disc degeneration, lumbar region: Secondary | ICD-10-CM | POA: Diagnosis not present

## 2017-03-21 DIAGNOSIS — Z79899 Other long term (current) drug therapy: Secondary | ICD-10-CM | POA: Diagnosis not present

## 2017-03-21 DIAGNOSIS — M179 Osteoarthritis of knee, unspecified: Secondary | ICD-10-CM | POA: Diagnosis not present

## 2017-03-21 DIAGNOSIS — Z79891 Long term (current) use of opiate analgesic: Secondary | ICD-10-CM | POA: Diagnosis not present

## 2017-03-21 DIAGNOSIS — G894 Chronic pain syndrome: Secondary | ICD-10-CM | POA: Diagnosis not present

## 2017-03-21 DIAGNOSIS — M47816 Spondylosis without myelopathy or radiculopathy, lumbar region: Secondary | ICD-10-CM | POA: Diagnosis not present

## 2017-04-13 ENCOUNTER — Encounter: Payer: Self-pay | Admitting: Physical Medicine & Rehabilitation

## 2017-04-16 ENCOUNTER — Other Ambulatory Visit: Payer: Self-pay | Admitting: Family Medicine

## 2017-04-17 ENCOUNTER — Encounter: Payer: Self-pay | Admitting: Family Medicine

## 2017-04-17 ENCOUNTER — Ambulatory Visit (INDEPENDENT_AMBULATORY_CARE_PROVIDER_SITE_OTHER): Payer: Medicare Other | Admitting: Family Medicine

## 2017-04-17 VITALS — BP 142/72 | HR 67 | Temp 97.6°F | Ht 67.0 in | Wt 189.0 lb

## 2017-04-17 DIAGNOSIS — R5383 Other fatigue: Secondary | ICD-10-CM | POA: Diagnosis not present

## 2017-04-17 DIAGNOSIS — R21 Rash and other nonspecific skin eruption: Secondary | ICD-10-CM | POA: Diagnosis not present

## 2017-04-17 DIAGNOSIS — I10 Essential (primary) hypertension: Secondary | ICD-10-CM

## 2017-04-17 MED ORDER — TRIAMCINOLONE ACETONIDE 0.1 % EX CREA: 1.0000 "application " | TOPICAL_CREAM | Freq: Two times a day (BID) | CUTANEOUS | 0 refills | Status: DC

## 2017-04-17 NOTE — Progress Notes (Signed)
Subjective:  Kevin [name] is a 76 y.o. year old very pleasant male patient who presents for/with See problem oriented charting ROS- see ros embedded below. No chest pain in addition. No increased shortness of breath.    Past Medical History-  Patient Active Problem List   Diagnosis Date Noted  . PAD (peripheral artery disease) ([place]) 01/08/2016    Priority: High  . Diastolic CHF ([place]) 29/93/7169    Priority: High  . CKD (chronic kidney disease), stage III ([place]) 01/27/2014    Priority: High  . AAA (abdominal aortic aneurysm, ruptured) ([place]) 01/17/2013    Priority: High  . Carotid artery stenosis s/p L carotid endarterectomy 01/12/2012    Priority: High  . History of CVA (cerebrovascular accident) 11/17/2011    Priority: High  . Chronic pain syndrome 12/23/2009    Priority: High  . Personal history of colonic polyps 05/15/2013    Priority: Medium  . Essential tremor 10/05/2009    Priority: Medium  . UNSPECIFIED ANEMIA 12/05/2008    Priority: Medium  . BPH (benign prostatic hyperplasia) 06/04/2007    Priority: Medium  . Fasting hyperglycemia 02/05/2007    Priority: Medium  . Hyperlipidemia 01/03/2007    Priority: Medium  . Essential hypertension 01/03/2007    Priority: Medium  . CAP (community acquired pneumonia) 03/17/2015    Priority: Low  . Family history of malignant neoplasm of gastrointestinal tract 05/15/2013    Priority: Low  . Inguinal hernia 05/01/2012    Priority: Low  . BURSITIS, HIP 12/21/2009    Priority: Low  . ACTINIC KERATOSIS, EAR, LEFT 03/09/2009    Priority: Low  . Osteoarthritis of left knee 11/02/2007    Priority: Low  . 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 Lumbar spondylosis 03/17/2017    Medications- reviewed and updated Current Outpatient Medications  Medication Sig Dispense Refill  . amLODipine (NORVASC) 10 MG tablet TAKE 1 TABLET BY MOUTH  DAILY 90 tablet 1  . atorvastatin (LIPITOR) 20 MG tablet TAKE 1 TABLET  BY MOUTH  DAILY 90 tablet 1  . Cholecalciferol (VITAMIN D-3) 1000 UNITS CAPS Take 1 capsule by mouth daily.    . clopidogrel (PLAVIX) 75 MG tablet TAKE 1 TABLET BY MOUTH  DAILY 90 tablet 1  . diazepam (VALIUM) 5 MG tablet Take 0.5-1 tablets (2.5-5 mg total) by mouth every 8 (eight) hours as needed for muscle spasms or sedation. 40 tablet 2  . fenofibrate 160 MG tablet TAKE 1 TABLET BY MOUTH  DAILY 90 tablet 3  . furosemide (LASIX) 20 MG tablet Take 1 tablet (20 mg total) by mouth every other day. 90 tablet 3  . gabapentin (NEURONTIN) 300 MG capsule TAKE 1 CAPSULE BY MOUTH TWO TIMES DAILY 180 capsule 1  . Melatonin 3 MG TABS Take 5 mg by mouth at bedtime.     Marland Kitchen oxyCODONE-acetaminophen (PERCOCET/ROXICET) 5-325 MG tablet Take 1 tablet by mouth every 8 (eight) hours as needed for moderate pain. 6 tablet 0  . propranolol (INDERAL) 40 MG tablet TAKE 1 TABLET BY MOUTH TWO  TIMES DAILY 180 tablet 3  . tamsulosin (FLOMAX) 0.4 MG CAPS capsule TAKE 1 CAPSULE BY MOUTH  DAILY 90 capsule 3  . tiZANidine (ZANAFLEX) 4 MG tablet TAKE 1 TABLET BY MOUTH  EVERY 6 HOURS AS NEEDED FOR MUSCLE SPASMS 90 tablet 1  . vitamin B-12 (CYANOCOBALAMIN) 1000 MCG tablet Take 1,000 mcg by mouth daily.    Marland Kitchen triamcinolone cream (KENALOG) 0.1 % Apply  1 application 2 (two) times daily topically. For 7-10 days maximum 80 g 0   No current facility-administered medications for this visit.     Objective: BP (!) 142/72 (BP Location: Left Arm, Cuff Size: Normal)   Pulse 67   Temp 97.6 F (36.4 C) (Oral)   Ht 5\' 7"  (1.702 m)   Wt 189 lb (85.7 kg)   SpO2 96%   BMI 29.60 kg/m  Gen: NAD, resting comfortably CV: RRR no murmurs rubs or gallops Lungs: CTAB no crackles, wheeze, rhonchi Abdomen: soft/nontender. Overweight Ext: no edema Skin: warm, dry, see below Slight tremor noted withintention  Assessment/Plan:  Rash S:LUQ of abdomen and into under both arms - erythematous papules in this area. Very itchy. Gold bond cream on  LUQ. Denies new contacts like fabric softener, detergents, soap, shampoo. Rash present for a week at least.  ROS-not ill appearing, no fever/chills. No new medications. Not immunocompromised. No mucus membrane involvement.  O: on left chest under areola- patient with several small erythematous papules. Mild excoriation.  A/P: Unclear cause of rash- potential contact? Will trial triamcinolone for 7-10 days. Follow up if not improved.   Fatigue, unspecified type - Plan: CBC with Differential/Platelet, Comprehensive metabolic panel, TSH S: Patient tells me his Mind tells him he is 54- often overdoes it outdoors or around the house and then feels run down. This usually lasts 2-3 days where he feels really tired and he rests then feels better. Does states feels mildly fatigued overall though but particularly on those days. Some congestions in the AM and feels fatigued with that. Sleeps on back. Gets better when up and moving.   A/P: I suspect patient is overdoing it with his physical exertion on certain days- would likely be more ideal to spread out same labor intense activity over several days for shorter periods of times- would likely even help with his indurance. He will consider but does agree to cbc, cmp, tsh  Essential hypertension S: controlled poorly today on  propranolol 40mg  BID (change 06/2016) and amlodipine 10mg .  BP Readings from Last 3 Encounters:  04/17/17 (!) 142/72  03/17/17 138/68  12/14/16 128/72  A/P: We discussed blood pressure goal of <140/90. Continue current meds:  Slightly elevated today but has been well controlled- if high at follow up would certainly make adjustments    Future Appointments  Date Time Provider [place]  05/05/2017  9:45 AM [facility] None  05/05/2017 10:00 AM Kirsteins, Luanna Salk, MD [facility] None  06/16/2017  9:00 AM Gerda Diss, DO [facility] None   Orders Placed This Encounter  Procedures  . CBC with Differential/Platelet   . Comprehensive metabolic panel    [place]  . TSH    [place]    Meds ordered this encounter  Medications  . triamcinolone cream (KENALOG) 0.1 %    Sig: Apply 1 application 2 (two) times daily topically. For 7-10 days maximum    Dispense:  80 g    Refill:  0   Return precautions advised.  Garret Reddish, MD

## 2017-04-17 NOTE — Patient Instructions (Addendum)
Please stop by lab before you go to look for cause of fatigue. It sounds like that you need to work outside or around the house for fewer hours but more frequent  Try cream for rash for 7-10days. Can see you back if not improving. Not sure what is causing this.

## 2017-04-17 NOTE — Assessment & Plan Note (Addendum)
S: controlled poorly today on  propranolol 40mg  BID (change 06/2016) and amlodipine 10mg .  BP Readings from Last 3 Encounters:  04/17/17 (!) 142/72  03/17/17 138/68  12/14/16 128/72  A/P: We discussed blood pressure goal of <140/90. Continue current meds:  Slightly elevated today but has been well controlled- if high at follow up would certainly make adjustments. He agrees if above goal at upcoming 05/05/17 or 06/16/17 appt within system to follow up with me

## 2017-04-18 DIAGNOSIS — M5136 Other intervertebral disc degeneration, lumbar region: Secondary | ICD-10-CM | POA: Diagnosis not present

## 2017-04-18 DIAGNOSIS — G894 Chronic pain syndrome: Secondary | ICD-10-CM | POA: Diagnosis not present

## 2017-04-18 DIAGNOSIS — M47816 Spondylosis without myelopathy or radiculopathy, lumbar region: Secondary | ICD-10-CM | POA: Diagnosis not present

## 2017-04-18 DIAGNOSIS — M179 Osteoarthritis of knee, unspecified: Secondary | ICD-10-CM | POA: Diagnosis not present

## 2017-04-18 LAB — COMPREHENSIVE METABOLIC PANEL
ALK PHOS: 30 U/L — AB (ref 39–117)
ALT: 18 U/L (ref 0–53)
AST: 25 U/L (ref 0–37)
Albumin: 4.3 g/dL (ref 3.5–5.2)
BILIRUBIN TOTAL: 0.6 mg/dL (ref 0.2–1.2)
BUN: 24 mg/dL — ABNORMAL HIGH (ref 6–23)
CALCIUM: 9.3 mg/dL (ref 8.4–10.5)
CO2: 27 meq/L (ref 19–32)
CREATININE: 1.96 mg/dL — AB (ref 0.40–1.50)
Chloride: 104 mEq/L (ref 96–112)
GFR: 35.3 mL/min — AB (ref >60.00)
GLUCOSE: 119 mg/dL — AB (ref 70–99)
Potassium: 4.2 mEq/L (ref 3.5–5.1)
Sodium: 138 mEq/L (ref 135–145)
TOTAL PROTEIN: 6.6 g/dL (ref 6.0–8.3)

## 2017-04-18 LAB — CBC WITH DIFFERENTIAL/PLATELET
BASOS ABS: 0.1 10*3/uL (ref 0.0–0.1)
BASOS PCT: 1.2 % (ref 0.0–3.0)
EOS ABS: 0.5 10*3/uL (ref 0.0–0.7)
Eosinophils Relative: 7.7 % — ABNORMAL HIGH (ref 0.0–5.0)
HEMATOCRIT: 36.5 % — AB (ref 39.0–52.0)
Hemoglobin: 12 g/dL — ABNORMAL LOW (ref 13.0–17.0)
LYMPHS ABS: 2.3 10*3/uL (ref 0.7–4.0)
LYMPHS PCT: 32.3 % (ref 12.0–46.0)
MCHC: 32.8 g/dL (ref 30.0–36.0)
MCV: 92.7 fl (ref 78.0–100.0)
MONO ABS: 0.7 10*3/uL (ref 0.1–1.0)
Monocytes Relative: 9.5 % (ref 3.0–12.0)
NEUTROS ABS: 3.5 10*3/uL (ref 1.4–7.7)
NEUTROS PCT: 49.3 % (ref 43.0–77.0)
PLATELETS: 255 10*3/uL (ref 150.0–400.0)
RBC: 3.94 Mil/uL — AB (ref 4.22–5.81)
RDW: 14 % (ref 11.5–15.5)
WBC: 7.1 10*3/uL (ref 4.0–10.5)

## 2017-04-18 LAB — TSH: TSH: 0.96 u[IU]/mL (ref 0.35–4.50)

## 2017-05-05 ENCOUNTER — Ambulatory Visit: Payer: Medicare Other | Admitting: Physical Medicine & Rehabilitation

## 2017-05-05 ENCOUNTER — Encounter: Payer: Medicare Other | Attending: Physical Medicine & Rehabilitation

## 2017-05-09 ENCOUNTER — Encounter: Payer: Self-pay | Admitting: Physical Medicine & Rehabilitation

## 2017-05-16 DIAGNOSIS — M47816 Spondylosis without myelopathy or radiculopathy, lumbar region: Secondary | ICD-10-CM | POA: Diagnosis not present

## 2017-05-16 DIAGNOSIS — M179 Osteoarthritis of knee, unspecified: Secondary | ICD-10-CM | POA: Diagnosis not present

## 2017-05-16 DIAGNOSIS — Z79891 Long term (current) use of opiate analgesic: Secondary | ICD-10-CM | POA: Diagnosis not present

## 2017-05-16 DIAGNOSIS — G894 Chronic pain syndrome: Secondary | ICD-10-CM | POA: Diagnosis not present

## 2017-05-16 DIAGNOSIS — Z79899 Other long term (current) drug therapy: Secondary | ICD-10-CM | POA: Diagnosis not present

## 2017-05-16 DIAGNOSIS — M5136 Other intervertebral disc degeneration, lumbar region: Secondary | ICD-10-CM | POA: Diagnosis not present

## 2017-05-17 ENCOUNTER — Other Ambulatory Visit: Payer: Self-pay | Admitting: Family Medicine

## 2017-05-17 ENCOUNTER — Other Ambulatory Visit: Payer: Self-pay | Admitting: Adult Health

## 2017-06-01 ENCOUNTER — Encounter: Payer: Self-pay | Admitting: Family Medicine

## 2017-06-01 ENCOUNTER — Ambulatory Visit (INDEPENDENT_AMBULATORY_CARE_PROVIDER_SITE_OTHER): Payer: Medicare HMO | Admitting: Family Medicine

## 2017-06-01 VITALS — BP 118/64 | HR 58 | Temp 98.6°F | Ht 67.0 in | Wt 185.4 lb

## 2017-06-01 DIAGNOSIS — B9689 Other specified bacterial agents as the cause of diseases classified elsewhere: Secondary | ICD-10-CM

## 2017-06-01 DIAGNOSIS — J208 Acute bronchitis due to other specified organisms: Secondary | ICD-10-CM | POA: Diagnosis not present

## 2017-06-01 DIAGNOSIS — J329 Chronic sinusitis, unspecified: Secondary | ICD-10-CM | POA: Diagnosis not present

## 2017-06-01 MED ORDER — ALBUTEROL SULFATE HFA 108 (90 BASE) MCG/ACT IN AERS: 2.0000 | INHALATION_SPRAY | Freq: Four times a day (QID) | RESPIRATORY_TRACT | 0 refills | Status: DC | PRN

## 2017-06-01 MED ORDER — AZITHROMYCIN 250 MG PO TABS: ORAL_TABLET | ORAL | 0 refills | Status: DC

## 2017-06-01 NOTE — Addendum Note (Signed)
Addended by: Marin Olp on: 06/01/2017 01:37 PM   Modules accepted: Orders

## 2017-06-01 NOTE — Progress Notes (Addendum)
PCP: Marin Olp, MD  Subjective:  Kevin [name] is a 77 y.o. year old very pleasant male patient who presents with  symptoms including nasal congestion with green/yellow discharge, mild sore throat, cough, chest congestion. Minimal sinus pressure - does have some wheeze as well -started: 3 weeks ago, symptoms show no change. Fatigue for 2 weeks. 3-4 days of deep hacking cough -previous treatments: they avoid OTCs -sick contacts/travel/risks: denies flu exposure.   ROS-denies fever, NVD, tooth pain. Denies significant shortness of breath.   Pertinent Past Medical History-  Patient Active Problem List   Diagnosis Date Noted  . PAD (peripheral artery disease) ([place]) 01/08/2016    Priority: High  . Diastolic CHF ([place]) 52/77/8242    Priority: High  . CKD (chronic kidney disease), stage III ([place]) 01/27/2014    Priority: High  . AAA (abdominal aortic aneurysm, ruptured) ([place]) 01/17/2013    Priority: High  . Carotid artery stenosis s/p L carotid endarterectomy 01/12/2012    Priority: High  . History of CVA (cerebrovascular accident) 11/17/2011    Priority: High  . Chronic pain syndrome 12/23/2009    Priority: High  . Personal history of colonic polyps 05/15/2013    Priority: Medium  . Essential tremor 10/05/2009    Priority: Medium  . UNSPECIFIED ANEMIA 12/05/2008    Priority: Medium  . BPH (benign prostatic hyperplasia) 06/04/2007    Priority: Medium  . Fasting hyperglycemia 02/05/2007    Priority: Medium  . Hyperlipidemia 01/03/2007    Priority: Medium  . Essential hypertension 01/03/2007    Priority: Medium  . CAP (community acquired pneumonia) 03/17/2015    Priority: Low  . Family history of malignant neoplasm of gastrointestinal tract 05/15/2013    Priority: Low  . Inguinal hernia 05/01/2012    Priority: Low  . BURSITIS, HIP 12/21/2009    Priority: Low  . ACTINIC KERATOSIS, EAR, LEFT 03/09/2009    Priority: Low  . Osteoarthritis of left knee 11/02/2007   Priority: Low  . 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 Lumbar spondylosis 03/17/2017    Medications- reviewed  Current Outpatient Medications  Medication Sig Dispense Refill  . amLODipine (NORVASC) 10 MG tablet TAKE 1 TABLET BY MOUTH  DAILY 90 tablet 1  . atorvastatin (LIPITOR) 20 MG tablet TAKE 1 TABLET BY MOUTH  DAILY 90 tablet 1  . Cholecalciferol (VITAMIN D-3) 1000 UNITS CAPS Take 1 capsule by mouth daily.    . clopidogrel (PLAVIX) 75 MG tablet TAKE 1 TABLET BY MOUTH  DAILY 90 tablet 1  . diazepam (VALIUM) 5 MG tablet Take 0.5-1 tablets (2.5-5 mg total) by mouth every 8 (eight) hours as needed for muscle spasms or sedation. 40 tablet 2  . fenofibrate 160 MG tablet TAKE 1 TABLET BY MOUTH  DAILY 90 tablet 3  . furosemide (LASIX) 20 MG tablet TAKE 1 TABLET BY MOUTH  EVERY OTHER DAY 45 tablet 3  . gabapentin (NEURONTIN) 300 MG capsule TAKE 1 CAPSULE BY MOUTH TWO TIMES DAILY 180 capsule 1  . Melatonin 3 MG TABS Take 5 mg by mouth at bedtime.     Marland Kitchen oxyCODONE-acetaminophen (PERCOCET) 7.5-325 MG tablet Take 1 tablet by mouth 3 (three) times daily.    . propranolol (INDERAL) 40 MG tablet TAKE 1 TABLET BY MOUTH TWO  TIMES DAILY 180 tablet 3  . tamsulosin (FLOMAX) 0.4 MG CAPS capsule TAKE 1 CAPSULE BY MOUTH  DAILY 90 capsule 3  . tiZANidine (ZANAFLEX) 4 MG tablet TAKE  1 TABLET BY MOUTH  EVERY 6 HOURS AS NEEDED FOR MUSCLE SPASMS 90 tablet 1  . triamcinolone cream (KENALOG) 0.1 % Apply 1 application 2 (two) times daily topically. For 7-10 days maximum 80 g 0  . vitamin B-12 (CYANOCOBALAMIN) 1000 MCG tablet Take 1,000 mcg by mouth daily.    Marland Kitchen azithromycin (ZITHROMAX) 250 MG tablet Take 2 tabs on day 1, then 1 tab daily until finished 6 tablet 0   Objective: BP 118/64 (BP Location: Left Arm, Patient Position: Sitting, Cuff Size: Large)   Pulse (!) 58   Temp 98.6 F (37 C) (Oral)   Ht 5\' 7"  (1.702 m)   Wt 185 lb 6.4 oz (84.1 kg)   SpO2 95%   BMI 29.04 kg/m  Gen:  NAD, resting comfortably HEENT: Turbinates erythematous with yellow disharge, minimal sinus tenderness TM normal, pharynx mildly erythematous with no tonsilar exudate or edem CV: RRR no murmurs rubs or gallops Lungs: CTAB no crackles, wheeze, rhonchi  Ext: no edema Skin: warm, dry, no rash  Assessment/Plan:  Bronchitis along with bacterial sinusitis History and exam today are suggestive of viral infection most likely due to bronchitis. On the other hand he also has some significant sinus drainage now for 3 weeks which would indicate bacterial sinusitis. They prefer to avoid OTC medications given long list of medications he is already on.  Discussed prednisone: opted out due to baseline hyperglycemia. Will use albuterol for cough/wheeze.  Due to the likely coexisting sinusitis will treat with azithromycin which would cover atypical pneumonias as well as majority of sinus infections. I agreed to call in augmentin if needed next week. If neither of these works- likely would need to wait this out another 2 weeks as long as no fever or shortness of breath.   Likely course of 3-6 weeks for bronchitis, nasal symptoms/discharge should improve within 48 hours if truly bacterial as suspected. Patient is contagious and advised good handwashing and consideration of mask If going to be in public places.   High risk patient given age,PAD, chronic pain, CKD III, hyperglycemia (avoid prednisone if able)  Meds ordered this encounter  Medications  . azithromycin (ZITHROMAX) 250 MG tablet    Sig: Take 2 tabs on day 1, then 1 tab daily until finished    Dispense:  6 tablet    Refill:  0    Garret Reddish, MD

## 2017-06-01 NOTE — Patient Instructions (Signed)
Bronchitis along with bacterial sinusitis History and exam today are suggestive of viral infection most likely due to bronchitis. On the other hand he also has some significant sinus drainage now for 3 weeks which would indicate bacterial sinusitis. They prefer to avoid OTC medications given long list of medications he is already on.  Discussed prednisone: opted out due to baseline hyperglycemia Due to the likely coexisting sinusitis will treat with azithromycin which would cover atypical pneumonias as well as majority of sinus infections. I agreed to call in augmentin if needed next week. If neither of these works- likely would need to wait this out another 2 weeks as long as no fever or shortness of breath.   Likely course of 3-6 weeks for bronchitis, nasal symptoms/discharge should improve within 48 hours if truly bacterial as suspected. Patient is contagious and advised good handwashing and consideration of mask If going to be in public places.

## 2017-06-07 ENCOUNTER — Telehealth: Payer: Self-pay | Admitting: Family Medicine

## 2017-06-07 MED ORDER — AMOXICILLIN-POT CLAVULANATE 875-125 MG PO TABS: 1.0000 | ORAL_TABLET | Freq: Two times a day (BID) | ORAL | 0 refills | Status: AC

## 2017-06-07 NOTE — Telephone Encounter (Signed)
I sent in augmentin- please inform patient

## 2017-06-07 NOTE — Telephone Encounter (Signed)
Called patient and updated him regarding prescription. All questions answered.

## 2017-06-07 NOTE — Telephone Encounter (Signed)
Copied from [place] [id number]. Topic: Quick Communication - See Telephone Encounter >> Jun 07, 2017  8:35 AM [place], Laban Emperor wrote: CRM for notification. See Telephone encounter for:  Pt states that his cough is not getting any better and would like for the provider to call him in a medication.  [place], [place] - [address] [phone] ([phone] (Fax)    06/07/17.

## 2017-06-08 ENCOUNTER — Other Ambulatory Visit: Payer: Self-pay

## 2017-06-08 MED ORDER — TAMSULOSIN HCL 0.4 MG PO CAPS: 0.4000 mg | ORAL_CAPSULE | Freq: Every day | ORAL | 3 refills | Status: DC

## 2017-06-08 MED ORDER — ATORVASTATIN CALCIUM 20 MG PO TABS: 20.0000 mg | ORAL_TABLET | Freq: Every day | ORAL | 1 refills | Status: DC

## 2017-06-08 MED ORDER — FENOFIBRATE 160 MG PO TABS: 160.0000 mg | ORAL_TABLET | Freq: Every day | ORAL | 3 refills | Status: DC

## 2017-06-08 MED ORDER — FUROSEMIDE 20 MG PO TABS: 20.0000 mg | ORAL_TABLET | ORAL | 1 refills | Status: DC

## 2017-06-08 MED ORDER — AMLODIPINE BESYLATE 10 MG PO TABS: 10.0000 mg | ORAL_TABLET | Freq: Every day | ORAL | 1 refills | Status: DC

## 2017-06-08 MED ORDER — PROPRANOLOL HCL 40 MG PO TABS: 40.0000 mg | ORAL_TABLET | Freq: Two times a day (BID) | ORAL | 3 refills | Status: DC

## 2017-06-08 MED ORDER — DIAZEPAM 5 MG PO TABS: 2.5000 mg | ORAL_TABLET | Freq: Three times a day (TID) | ORAL | 1 refills | Status: DC | PRN

## 2017-06-08 MED ORDER — GABAPENTIN 300 MG PO CAPS: 300.0000 mg | ORAL_CAPSULE | Freq: Two times a day (BID) | ORAL | 1 refills | Status: DC

## 2017-06-08 MED ORDER — TIZANIDINE HCL 4 MG PO TABS: ORAL_TABLET | ORAL | 1 refills | Status: DC

## 2017-06-08 MED ORDER — CLOPIDOGREL BISULFATE 75 MG PO TABS: 75.0000 mg | ORAL_TABLET | Freq: Every day | ORAL | 1 refills | Status: DC

## 2017-06-16 ENCOUNTER — Encounter: Payer: Self-pay | Admitting: Physical Medicine & Rehabilitation

## 2017-06-16 ENCOUNTER — Encounter: Payer: Medicare HMO | Attending: Sports Medicine

## 2017-06-16 ENCOUNTER — Ambulatory Visit (INDEPENDENT_AMBULATORY_CARE_PROVIDER_SITE_OTHER): Payer: Medicare HMO | Admitting: Sports Medicine

## 2017-06-16 ENCOUNTER — Ambulatory Visit (HOSPITAL_BASED_OUTPATIENT_CLINIC_OR_DEPARTMENT_OTHER): Payer: Medicare Other | Admitting: Physical Medicine & Rehabilitation

## 2017-06-16 ENCOUNTER — Encounter: Payer: Self-pay | Admitting: Sports Medicine

## 2017-06-16 ENCOUNTER — Ambulatory Visit: Payer: Medicare Other | Admitting: Sports Medicine

## 2017-06-16 VITALS — BP 140/70 | HR 56 | Ht 67.0 in | Wt 185.0 lb

## 2017-06-16 VITALS — BP 173/80 | HR 54

## 2017-06-16 DIAGNOSIS — M1712 Unilateral primary osteoarthritis, left knee: Secondary | ICD-10-CM | POA: Diagnosis not present

## 2017-06-16 DIAGNOSIS — I739 Peripheral vascular disease, unspecified: Secondary | ICD-10-CM | POA: Diagnosis not present

## 2017-06-16 DIAGNOSIS — M48061 Spinal stenosis, lumbar region without neurogenic claudication: Secondary | ICD-10-CM

## 2017-06-16 DIAGNOSIS — M47816 Spondylosis without myelopathy or radiculopathy, lumbar region: Secondary | ICD-10-CM

## 2017-06-16 DIAGNOSIS — M25551 Pain in right hip: Secondary | ICD-10-CM | POA: Diagnosis not present

## 2017-06-16 DIAGNOSIS — Z79899 Other long term (current) drug therapy: Secondary | ICD-10-CM

## 2017-06-16 DIAGNOSIS — G894 Chronic pain syndrome: Secondary | ICD-10-CM | POA: Diagnosis not present

## 2017-06-16 DIAGNOSIS — Z5181 Encounter for therapeutic drug level monitoring: Secondary | ICD-10-CM | POA: Diagnosis not present

## 2017-06-16 NOTE — Patient Instructions (Signed)

## 2017-06-16 NOTE — Progress Notes (Signed)
Subjective:    Patient ID: Kevin [name], male    DOB: September 19, 1940, 77 y.o.   MRN: [id number]  HPI CC  Low back pain  Right Hip onset 34yrs, diagnosed with RA, this was accompanied or followed by a gradual increase in low back pain.  Intermmitent back pain, pain occurs on a daily basis but is not constant throughout the day.  Worse in the morning and in the evening.  Patient is independent with all his self-care and mobility continues to drive.  He no longer works. He still does work around the house and in fact [employer] for a [employer].  Preferred pain management did what sounds like 2 sets of medial branch, 1st set was not helpful, second set helped a couple days No hx of RF   Takes percocet twice a day 7.5 mg   Sees Dr Ileene Rubens for knee injections last time in Oct 2018, US guided corticosteroid, patient has another appointment today  BUE tremors > 4 years has not seen neurologist Father had Parkinson's  The patient and I reviewed the results of his MRI performed in March 2018. We discussed what scoliosis was.  MRI LUMBAR SPINE WITHOUT CONTRAST  TECHNIQUE: Multiplanar, multisequence MR imaging of the lumbar spine was performed. No intravenous contrast was administered.  COMPARISON:  MRI lumbar spine 12/29/2009.  FINDINGS: Segmentation:  Standard.  Alignment: Marked convex right scoliosis with the apex at L3-4 is again seen. Degree of curvature appears worse than on the prior study. 0.4 cm degenerative retrolisthesis L4 on L5 and trace retrolisthesis L5 on S1 are identified.  Vertebrae: No fracture or worrisome lesion. Degenerative endplate signal change appears worst at L2-3 and L3-4.  Conus medullaris: Extends to the L1-2 level and appears normal.  Paraspinal and other soft tissues: Abdominal aortic aneurysm measuring 7.0 x 5.1 cm is again seen. Thrombus in the aneurysm is now hypointense on T2 weighted imaging consistent with chronicity. Note is made  that the patient has a history of aneurysm repair.  Disc levels:  T12-L1 is imaged in the sagittal plane only. There is a shallow disc bulge to the right but the central canal and foramina appear open.  L1-2: Shallow disc bulge with more focally protruding disc in the right subarticular recess. Protruding disc is more prominent than on the prior examination but the central canal and foramina are open.  L2-3: Small focus of autologous fusion across the disc interspace is new since the prior MRI. There is a shallow disc bulge but the central canal and foramina are open.  L3-4: Disc bulge and endplate spur are eccentric to the left. Paravertebral endplate spurring has markedly progressed since the prior examination, particularly on the left. Facet degenerative change is noted. Mild to moderate foraminal narrowing is worse on the left. The central canal is open.  L4-5: Disc bulge, endplate spur and facet arthropathy have progressed since the prior exam. Moderate to moderately severe bilateral foraminal narrowing is identified. There is mild central canal stenosis.  L5-S1: Bilateral facet degenerative disease, ligamentum flavum thickening and a disc bulge are seen. The central canal and left foramen are open. Moderate right foraminal narrowing is identified.  IMPRESSION: Convex right scoliosis appears worse than on the prior exam.  Mild to moderate foraminal narrowing at L3-4 is worse on the left and appears somewhat increased compared to the prior study. Left paravertebral endplate spur at this level has markedly progressed since the prior exam.  Moderate to moderately severe bilateral foraminal  narrowing at L4-5 due to bulging disc, endplate spur and facet degenerative disease appear worse than on the prior exam.  No marked change in moderate right foraminal narrowing L5-S1.   Electronically Signed   By: Inge Rise M.D.   On: 08/09/2016 12:26 Pain  Inventory Average Pain 6 Pain Right Now 4 My pain is sharp and aching  In the last 24 hours, has pain interfered with the following? General activity 6 Relation with others 3 Enjoyment of life 4 What TIME of day is your pain at its worst? daytime Sleep (in general) Fair  Pain is worse with: walking and bending Pain improves with: medication Relief from Meds: 7  Mobility walk without assistance ability to climb steps?  yes do you drive?  yes  Function retired  Neuro/Psych tremor trouble walking spasms dizziness  Prior Studies Any changes since last visit?  no  Physicians involved in your care Any changes since last visit?  no   Family History  Problem Relation Age of Onset  . Parkinsonism Father   . Dementia Father   . Cancer Father        Throat  . Colon cancer Mother        died in 28s  . Cancer Mother        Colon   Social History   Socioeconomic History  . Marital status: Married    Spouse name: None  . Number of children: None  . Years of education: None  . Highest education level: None  Social Needs  . Financial resource strain: None  . Food insecurity - worry: None  . Food insecurity - inability: None  . Transportation needs - medical: None  . Transportation needs - non-medical: None  Occupational History  . None  Tobacco Use  . Smoking status: Former Smoker    Packs/day: 2.00    Years: 28.00    Pack years: 56.00    Types: Cigarettes    Last attempt to quit: 05/31/1987    Years since quitting: 30.0  . Smokeless tobacco: Never Used  . Tobacco comment: quit smoking "in my 11's"  Substance and Sexual Activity  . Alcohol use: No    Alcohol/week: 0.0 oz  . Drug use: No    Comment: hx of-4-5 yrs ago  . Sexual activity: Not Currently  Other Topics Concern  . None  Social History Narrative   Married 33 years in 2015. Lived together for 12 years. 2 boys from first wife. 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       Hobbies: yardwork, [place] barn, woodwork, Orthoptist   Past Surgical History:  Procedure Laterality Date  . ABDOMINAL AORTIC ANEURYSM REPAIR  2010  . CAROTID ENDARTERECTOMY Left 09/20/2004  . COLONOSCOPY    . ENDARTERECTOMY Right 10/12/2015   Procedure: RIGHT CAROTID ENDARTERECTOMY WITH PATCH ANGIOPLASTY;  Surgeon: Elam Dutch, MD;  Location[place] [facility] [facility];  Service: Vascular;  Laterality: Right;  . INGUINAL HERNIA REPAIR Left 02/05/2013   Procedure: HERNIA REPAIR INGUINAL ADULT;  Surgeon: Joyice Faster. Cornett, MD;  Location: [facility];  Service: General;  Laterality: Left;  . INGUINAL HERNIA REPAIR Left   . INSERTION OF MESH Left 02/05/2013   Procedure: INSERTION OF MESH;  Surgeon: Joyice Faster. Cornett, MD;  Location: [facility];  Service: General;  Laterality: Left;  . REFRACTIVE SURGERY     2016-2017  . SHOULDER ARTHROSCOPY WITH ROTATOR CUFF REPAIR AND SUBACROMIAL DECOMPRESSION Left 05/01/2014   Procedure: LEFT ARTHROSCOPY SHOULDER SUBACROMIAL DECOMPRESSION,DISTAL CLAVICAL RESECTION AND ROTATOR CUFF REPAIR;  Surgeon: Marin Shutter, MD;  Location: [facility];  Service: Orthopedics;  Laterality: Left;  . TESTICLAR CYST EXCISION Left   . TONSILLECTOMY    . VASECTOMY     Past Medical History:  Diagnosis Date  . Arthritis    DDD- Lower Back  . Bradycardia   . CAP (community acquired pneumonia) 03/17/2015   "THAT'S WHAT THEY ARE THINKING; THEY ARE NOT SURE"  . Carotid artery occlusion   . Chronic lower back pain    DDD  . CKD (chronic kidney disease)    Dr. Corliss Parish  . Diverticulosis   . Enlarged prostate    takes Flomax daily  . History of blood transfusion    "probably when they did aortic aneurysm"  . History of colon polyps    benign  . Hyperlipidemia   . Hypertension    takes Amlodipine and Zebeta daily  . Insomnia    takes Melatonin nightly    . Joint pain   . 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     . Muscle spasm    takes Zanaflex daily as needed  . Peripheral edema    takes Furosemide daily  . Pneumonia    hx of   . Rheumatic fever 1946  . Rheumatoid arthritis ([place])   . Short-term memory loss    minimal  . Shortness of breath dyspnea    with exertion but lasts very short period of time  . TIA (transient ischemic attack)    x 2;takes Plavix daily  . Urinary frequency    takes Flomax daily  . Urinary urgency    BP (!) 173/80   Pulse (!) 54   SpO2 98%   Opioid Risk Score:   Fall Risk Score:  `1  Depression screen PHQ 2/9  Depression screen [facility] 2/9 09/08/2016 10/20/2015 07/02/2015 03/26/2015 09/22/2014 06/24/2013 12/26/2012  Decreased Interest 0 0 0 0 0 1 1  Down, Depressed, Hopeless 0 0 0 0 0 0 0  PHQ - 2 Score 0 0 0 0 0 1 1     Review of Systems  Constitutional: Negative.   HENT: Negative.   Eyes: Negative.   Respiratory: Negative.   Cardiovascular: Negative.   Gastrointestinal: Positive for constipation.  Endocrine: Negative.   Genitourinary: Positive for difficulty urinating.  Musculoskeletal: Negative.   Skin: Negative.   Allergic/Immunologic: Negative.   Neurological: Negative.   Hematological: Negative.   Psychiatric/Behavioral: Negative.   All other systems reviewed and are negative.      Objective:   Physical Exam  Constitutional: He is oriented to person, place, and time. He appears well-developed and well-nourished.  HENT:  Head: Normocephalic and atraumatic.  Eyes: Conjunctivae and EOM are normal. Pupils are equal, round, and reactive to light.  Neurological: He is alert and oriented to person, place, and time.  Reflex Scores:      Patellar reflexes are 2+ on the right side and 2+ on the left side.      Achilles reflexes are 0 on the right side and 0 on the left side. Motor strength is 5/5 bilateral hip flexor knee extensor ankle dorsiflexor.  5 out of 5  bilateral deltoid bicep tricep finger flexors and extensors Negative straight leg raising Sensation intact  to pinprick bilateral upper and lower limbs  Psychiatric: He has a normal mood and affect.  Nursing note and vitals reviewed.  Dextroconvex scoliosis lumbar       Assessment & Plan:  1.  Lumbar spondylosis without myelopathy as well as lumbar foraminal stenosis primarily at L4-5 but without any clear-cut radicular symptoms.  He has no evidence of neurogenic claudication. He has a significant dextro convex scoliosis centered at L3.  Overall he is functioning quite well on his current medication regimen which is low dose narcotic analgesic.  He has constipation but no other medication side effects. We discussed other treatment options however it does appear he has already had lumbar injections. May benefit from an exercise program such as aquatic therapy.  We discussed need for urine drug screen If UDS is consistent would be reasonable to continue his oxycodone 7.5 mg twice daily Nurse practitioner visit 2 weeks sign CSA

## 2017-06-16 NOTE — Procedures (Signed)
PROCEDURE NOTE: RIGHT GREATER TROCHANTERICINJECTION   DESCRIPTION OF PROCEDURE:  The patient's clinical condition is marked by substantial pain and/or significant functional disability. Other conservative therapy has not provided relief, is contraindicated, or not appropriate. There is a reasonable likelihood that injection will significantly improve the patient's pain and/or functional impairment. After discussing the risks, benefits and expected outcomes of the injection and all questions were reviewed and answered, the patient wished to undergo the above named procedure. Verbal consent was obtained. The target structure was injected under direct visualization using sterile technique as below: PREP: Alcohol, Ethel Chloride APPROACH: Direct, Single Injection, 22g 1.5" needle INJECTATE: 2 cc 0.5% marcaine, 2cc 40mg  DepoMedrol DRESSING: Band-Aid  Post procedural instructions including recommending icing and warning signs for infection were reviewed. This procedure was well tolerated and there were no complications.

## 2017-06-16 NOTE — Patient Instructions (Signed)
Recommend stool softener

## 2017-06-16 NOTE — Progress Notes (Signed)
Kevin [name] [phone]  Kevin [name] - 77 y.o. male MRN [id number]  Date of birth: [birth year]  Visit Date: 06/16/2017  PCP: Marin Olp, MD   Referred by: Marin Olp, MD   Scribe for today's visit: Wendy Poet, LAT, ATC     SUBJECTIVE:  Kevin [name] is here for Follow-up (B knee pain and LBP) .   Notes from office visit on 03/17/17: Kevin [name] is an established pt presenting today for f/u of his B knee pain.  He was last seen on 11/02/16 and had an injection in his L knee.  He was also prescribed a Body Helix knee sleeve.  He states that he feels like the injection did help w/ his L knee for a while but can't recall for how long it helped.  He states that he's experiencing aching L knee pain that he rates as a 3/10.  He also reports that he's been wearing the Body Helix sleeve fairly regularly and feels like it helps.    Pt states that he is most interested in discussing options for switching his care from pain management to [facility] for his chronic back pain.  Pt states that Dr. [name] had referred pt to pain management.  He states that's been OK but he would like to stay w/i the [facility] system and would like to discuss some other options.  Pt states that he has chronic LBP x approximately 4 years that radiates into his B hips.  He notes that he can no longer sleep normally due to the back and hip pain and now has to sleep in a recliner.   Compared to the last office visit on 03/17/17, his previously described knee pain and LBP symptoms about the same.  L knee currently doesn't hurt but is popping and snapping a lot.  Currently has pain in his R leg from this hip to knee.  States that he saw the PM&R doctor this morning and had a good visit. Current symptoms are mild in the L knee and moderate in the R LE & are radiating to the R thigh. He has been using the Gabapentin.   ROS Denies night time  disturbances. Denies fevers, chills, or night sweats. Denies unexplained weight loss. Denies personal history of cancer. Denies changes in bowel or bladder habits. Reports recent unreported falls. Denies new or worsening dyspnea or wheezing. Denies headaches or dizziness.  Denies numbness, tingling or weakness  In the extremities.  Denies dizziness or presyncopal episodes Denies lower extremity edema     HISTORY & PERTINENT PRIOR DATA:  Prior History reviewed and updated per electronic medical record.  Significant history, findings, studies and interim changes include:  reports that he quit smoking about 30 years ago. His smoking use included cigarettes. He has a 56.00 pack-year smoking history. he has never used smokeless tobacco. No results for input(s): HGBA1C, LABURIC, CREATINE in the last 8760 hours. No specialty comments available. Problem  Primary Osteoarthritis of Left Knee   Left knee-history injection. Some issues in R as well.       OBJECTIVE:  VS:  HT:5\' 7"  (170.2 cm)   WT:185 lb (83.9 kg)  BMI:28.97    BP:140/70  HR:(!) 56bpm  TEMP: ( )  RESP:97 %   PHYSICAL EXAM: Constitutional: WDWN, Non-toxic appearing. Psychiatric: Alert & appropriately interactive.  Not depressed or anxious appearing. Respiratory: No increased work of breathing.  Trachea  Midline Eyes: Pupils are equal.  EOM intact without nystagmus.  No scleral icterus  NEUROVASCULAR exam: No clubbing or cyanosis appreciated No significant venous stasis changes Capillary Refill: normal, less than 2 seconds    LOWER Extremities  SWELLING Pre-tibial edema: 2+/4 R > L pretibial edema pitting edema  PULSES Pedal Pulses: Normal & symmetrically palpable  SENSORY Dermatomes intact to light touch  MOTOR Normal strength in all myotomes  REFLEXES Reflexes: Normal & symmetric DTRs    Left knee: Slight valgus deformity.  He has pain with resisted flexion and extension but no significant patellar grind.  He  is ligamentously stable.  Range of motion from 2degrees to 110 degrees.  Right hip marked pain over the greater trochanter.  He has good internal and external rotation of his bilateral hips.  Negative Stinchfield test.  Some pain with straight leg raise but this is mild.  Low back has generalized tenderness to palpation but no radicular symptoms with popliteal compression or with greater sciatic notch palpation.  No additional findings.   ASSESSMENT & PLAN:   1. Greater trochanteric pain syndrome of right lower extremity   2. Lumbar spondylosis   3. PAD (peripheral artery disease) ([place])   4. Chronic pain syndrome   5. Primary osteoarthritis of left knee    PLAN: Right hip greater trochanteric injection performed today. Given his left knee has overall been doing quite well and he is only having occasional clicking and popping with no significant locking or giving way will defer any further treatment at this time.  If acute flareup can consider repeat corticosteroid injection and briefly discussed the option for Visco supplementation but will defer this at this time.  Patient is now being followed by Dr. Letta Pate both Dr. [name] and myself appreciate his expertise.    We will plan to follow-up with him in 10 weeks for consideration of repeat knee injection and he is welcome to call sooner for repeat injections as needed.  ++++++++++++++++++++++++++++++++++++++++++++ Orders & Meds: Orders Placed This Encounter  Procedures  . Large Joint Injection/Arthrocentesis    No orders of the defined types were placed in this encounter.   ++++++++++++++++++++++++++++++++++++++++++++ Follow-up: Return in about 10 weeks (around 08/25/2017).   Pertinent documentation may be included in additional procedure notes, imaging studies, problem based documentation and patient instructions. Please see these sections of the encounter for additional information regarding this visit. CMA/ATC served as Education administrator  during this visit. History, Physical, and Plan performed by medical provider. Documentation and orders reviewed and attested to.      Gerda Diss, [facility] Physician

## 2017-06-21 LAB — DRUG TOX MONITOR 1 W/CONF, ORAL FLD
ALPRAZOLAM: NEGATIVE ng/mL (ref <0.50)
Amphetamines: NEGATIVE ng/mL (ref <10)
Barbiturates: NEGATIVE ng/mL (ref <10)
Benzodiazepines: POSITIVE ng/mL — AB (ref <0.50)
Buprenorphine: NEGATIVE ng/mL (ref <0.10)
CHLORDIAZEPOXIDE: NEGATIVE ng/mL (ref <0.50)
CLONAZEPAM: NEGATIVE ng/mL (ref <0.50)
Cocaine: NEGATIVE ng/mL (ref <5.0)
Codeine: NEGATIVE ng/mL (ref <2.5)
DIAZEPAM: 2.56 ng/mL — AB (ref <0.50)
Dihydrocodeine: NEGATIVE ng/mL (ref <2.5)
Fentanyl: NEGATIVE ng/mL (ref <0.10)
Flunitrazepam: NEGATIVE ng/mL (ref <0.50)
Flurazepam: NEGATIVE ng/mL (ref <0.50)
HEROIN METABOLITE: NEGATIVE ng/mL (ref <1.0)
HYDROCODONE: NEGATIVE ng/mL (ref <2.5)
HYDROMORPHONE: NEGATIVE ng/mL (ref <2.5)
LORAZEPAM: NEGATIVE ng/mL (ref <0.50)
MARIJUANA: NEGATIVE ng/mL (ref <2.5)
MDMA: NEGATIVE ng/mL (ref <10)
MIDAZOLAM: NEGATIVE ng/mL (ref <0.50)
MORPHINE: NEGATIVE ng/mL (ref <2.5)
Meprobamate: NEGATIVE ng/mL (ref <2.5)
Methadone: NEGATIVE ng/mL (ref <5.0)
NICOTINE METABOLITE: NEGATIVE ng/mL (ref <5.0)
NORHYDROCODONE: NEGATIVE ng/mL (ref <2.5)
NOROXYCODONE: 10.6 ng/mL — AB (ref <2.5)
Nordiazepam: 3.82 ng/mL — ABNORMAL HIGH (ref <0.50)
OPIATES: POSITIVE ng/mL — AB (ref <2.5)
Oxazepam: NEGATIVE ng/mL (ref <0.50)
Oxycodone: 141.7 ng/mL — ABNORMAL HIGH (ref <2.5)
Oxymorphone: NEGATIVE ng/mL (ref <2.5)
PHENCYCLIDINE: NEGATIVE ng/mL (ref <10)
TEMAZEPAM: NEGATIVE ng/mL (ref <0.50)
TRIAZOLAM: NEGATIVE ng/mL (ref <0.50)
Tapentadol: NEGATIVE ng/mL (ref <5.0)
Tramadol: NEGATIVE ng/mL (ref <5.0)
ZOLPIDEM: NEGATIVE ng/mL (ref <5.0)

## 2017-06-21 LAB — DRUG TOX ALC METAB W/CON, ORAL FLD: Alcohol Metabolite: NEGATIVE ng/mL (ref <25)

## 2017-06-28 ENCOUNTER — Telehealth: Payer: Self-pay | Admitting: *Deleted

## 2017-06-28 NOTE — Telephone Encounter (Signed)
Oral swab drug screen was consistent for prescribed medications.  ?

## 2017-07-03 ENCOUNTER — Encounter: Payer: Medicare HMO | Attending: Physical Medicine & Rehabilitation | Admitting: Registered Nurse

## 2017-07-03 ENCOUNTER — Encounter: Payer: Self-pay | Admitting: Registered Nurse

## 2017-07-03 ENCOUNTER — Other Ambulatory Visit: Payer: Self-pay

## 2017-07-03 VITALS — BP 143/75 | HR 55

## 2017-07-03 DIAGNOSIS — M47816 Spondylosis without myelopathy or radiculopathy, lumbar region: Secondary | ICD-10-CM | POA: Diagnosis not present

## 2017-07-03 DIAGNOSIS — Z5181 Encounter for therapeutic drug level monitoring: Secondary | ICD-10-CM | POA: Diagnosis not present

## 2017-07-03 DIAGNOSIS — M25551 Pain in right hip: Secondary | ICD-10-CM | POA: Diagnosis not present

## 2017-07-03 DIAGNOSIS — Z79899 Other long term (current) drug therapy: Secondary | ICD-10-CM

## 2017-07-03 DIAGNOSIS — M1712 Unilateral primary osteoarthritis, left knee: Secondary | ICD-10-CM | POA: Diagnosis not present

## 2017-07-03 DIAGNOSIS — M48061 Spinal stenosis, lumbar region without neurogenic claudication: Secondary | ICD-10-CM

## 2017-07-03 DIAGNOSIS — G894 Chronic pain syndrome: Secondary | ICD-10-CM | POA: Diagnosis not present

## 2017-07-03 DIAGNOSIS — I739 Peripheral vascular disease, unspecified: Secondary | ICD-10-CM | POA: Insufficient documentation

## 2017-07-03 MED ORDER — OXYCODONE-ACETAMINOPHEN 7.5-325 MG PO TABS: 1.0000 | ORAL_TABLET | Freq: Two times a day (BID) | ORAL | 0 refills | Status: DC | PRN

## 2017-07-03 NOTE — Progress Notes (Signed)
Subjective:    Patient ID: Kevin [name], male    DOB: 1940/12/11, 77 y.o.   MRN: [id number]  HPI: [name] is a 77 year old male who returns for follow up appointment and medication initiation. He states his pain is located in his lower back. He rates his pain 3. His current exercise regime is walking and performing out door chores.  Mr. [name] reports he had a right hip injection by Dr. Paulla Fore on 06/16/2017 with good relief noted, note reviewed. Also states occasionally he has a right hand tremor, resting tremor noted. Mr. [name] would like to F/U with his PCP,he states he has an appointment scheduled. Also reports this has been ongoing for the last 15-20 years.   Mr. [name] Oral Swab was Performed on 06/16/2017, it was consistent.   Mr. [name] signed Controlled Substance Agreement and contract was reviewed and all questions answered.    Pain Inventory Average Pain 4 Pain Right Now 3 My pain is aching  In the last 24 hours, has pain interfered with the following? General activity 2 Relation with others 0 Enjoyment of life 2 What TIME of day is your pain at its worst? daytime Sleep (in general) Good  Pain is worse with: walking, bending and standing Pain improves with: rest and medication Relief from Meds: 8  Mobility walk without assistance how many minutes can you walk? 20 ability to climb steps?  yes do you drive?  yes  Function retired  Neuro/Psych No problems in this area  Prior Studies Any changes since last visit?  no  Physicians involved in your care Any changes since last visit?  no   Family History  Problem Relation Age of Onset  . Parkinsonism Father   . Dementia Father   . Cancer Father        Throat  . Colon cancer Mother        died in 48s  . Cancer Mother        Colon   Social History   Socioeconomic History  . Marital status: Married    Spouse name: None  . Number of children: None  . Years of education: None  . Highest education level:  None  Social Needs  . Financial resource strain: None  . Food insecurity - worry: None  . Food insecurity - inability: None  . Transportation needs - medical: None  . Transportation needs - non-medical: None  Occupational History  . None  Tobacco Use  . Smoking status: Former Smoker    Packs/day: 2.00    Years: 28.00    Pack years: 56.00    Types: Cigarettes    Last attempt to quit: 05/31/1987    Years since quitting: 30.1  . Smokeless tobacco: Never Used  . Tobacco comment: quit smoking "in my 41's"  Substance and Sexual Activity  . Alcohol use: No    Alcohol/week: 0.0 oz  . Drug use: No    Comment: hx of-4-5 yrs ago  . Sexual activity: Not Currently  Other Topics Concern  . None  Social History Narrative   Married 33 years in 2015. Lived together for 12 years. 2 boys from first wife. 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       Hobbies: yardwork, [place] barn, woodwork, Orthoptist   Past Surgical History:  Procedure Laterality Date  . ABDOMINAL AORTIC ANEURYSM REPAIR  2010  . CAROTID ENDARTERECTOMY Left 09/20/2004  . COLONOSCOPY    . ENDARTERECTOMY Right 10/12/2015   Procedure: RIGHT CAROTID ENDARTERECTOMY WITH PATCH ANGIOPLASTY;  Surgeon: Elam Dutch, MD;  Location: [facility];  Service: Vascular;  Laterality: Right;  . INGUINAL HERNIA REPAIR Left 02/05/2013   Procedure: HERNIA REPAIR INGUINAL ADULT;  Surgeon: Joyice Faster. Cornett, MD;  Location: [facility];  Service: General;  Laterality: Left;  . INGUINAL HERNIA REPAIR Left   . INSERTION OF MESH Left 02/05/2013   Procedure: INSERTION OF MESH;  Surgeon: Joyice Faster. Cornett, MD;  Location: [facility];  Service: General;  Laterality: Left;  . REFRACTIVE SURGERY     2016-2017  . SHOULDER ARTHROSCOPY WITH ROTATOR CUFF REPAIR AND SUBACROMIAL DECOMPRESSION Left 05/01/2014   Procedure:  LEFT ARTHROSCOPY SHOULDER SUBACROMIAL DECOMPRESSION,DISTAL CLAVICAL RESECTION AND ROTATOR CUFF REPAIR;  Surgeon: Marin Shutter, MD;  Location: [facility];  Service: Orthopedics;  Laterality: Left;  . TESTICLAR CYST EXCISION Left   . TONSILLECTOMY    . VASECTOMY     Past Medical History:  Diagnosis Date  . Arthritis    DDD- Lower Back  . Bradycardia   . CAP (community acquired pneumonia) 03/17/2015   "THAT'S WHAT THEY ARE THINKING; THEY ARE NOT SURE"  . Carotid artery occlusion   . Chronic lower back pain    DDD  . CKD (chronic kidney disease)    Dr. Corliss Parish  . Diverticulosis   . Enlarged prostate    takes Flomax daily  . History of blood transfusion    "probably when they did aortic aneurysm"  . History of colon polyps    benign  . Hyperlipidemia   . Hypertension    takes Amlodipine and Zebeta daily  . Insomnia    takes Melatonin nightly  . Joint pain   . 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     . Muscle spasm    takes Zanaflex daily as needed  . Peripheral edema    takes Furosemide daily  . Pneumonia    hx of   . Rheumatic fever 1946  . Rheumatoid arthritis ([place])   . Short-term memory loss    minimal  . Shortness of breath dyspnea    with exertion but lasts very short period of time  . TIA (transient ischemic attack)    x 2;takes Plavix daily  . Urinary frequency    takes Flomax daily  . Urinary urgency    BP (!) 143/75   Pulse (!) 55   SpO2 95%   Opioid Risk Score:   4 Fall Risk Score:  `1  Depression screen PHQ 2/9  Depression screen [facility] 2/9 07/03/2017 09/08/2016 10/20/2015 07/02/2015 03/26/2015 09/22/2014 06/24/2013  Decreased Interest 0 0 0 0 0 0 1  Down, Depressed, Hopeless 0 0 0 0 0 0 0  PHQ - 2 Score 0 0 0 0 0 0 1     Review of Systems  Constitutional: Negative.   HENT: Negative.   Respiratory: Negative.   Cardiovascular: Negative.   Gastrointestinal: Negative.   Endocrine: Negative.   Musculoskeletal: Positive  for back pain.  Skin: Negative.   Allergic/Immunologic: Negative.   Neurological: Negative.   Hematological: Negative.   Psychiatric/Behavioral: Negative.   All other systems reviewed and are negative.      Objective:   Physical Exam  Constitutional: He is oriented to person, place, and  time. He appears well-developed and well-nourished.  HENT:  Head: Normocephalic and atraumatic.  Neck: Normal range of motion. Neck supple.  Cardiovascular: Normal rate and regular rhythm.  Pulmonary/Chest: Effort normal and breath sounds normal.  Musculoskeletal:  Normal Muscle Bulk and Muscle Testing Reveals: Upper Extremities: Full ROM and Muscle Strength 5/5 Lumbar Paraspinal Tenderness: L-4-L-5 Lower Extremities: Full ROM and Muscle Strength 5/5 Arises from chair with ease Narrow Based Gait  Neurological: He is alert and oriented to person, place, and time.  Skin: Skin is warm and dry.  Psychiatric: He has a normal mood and affect.  Nursing note and vitals reviewed.         Assessment & Plan:  1. Lumbar Spondylosis/ Lumbar Stenosis: RX: Oxycodone 7.5/325 mg one tablet twice daily as needed for severe pain #60.  We will continue the opioid monitoring program, this consists of regular clinic visits, examinations, urine drug screen, pill counts as well as use of [place] Controlled Substance Reporting System.   F/U in 1 month

## 2017-07-06 ENCOUNTER — Ambulatory Visit (INDEPENDENT_AMBULATORY_CARE_PROVIDER_SITE_OTHER): Payer: Medicare HMO | Admitting: Family Medicine

## 2017-07-06 ENCOUNTER — Encounter: Payer: Self-pay | Admitting: Family Medicine

## 2017-07-06 VITALS — BP 130/70 | HR 55 | Temp 98.0°F | Ht 67.0 in | Wt 179.5 lb

## 2017-07-06 DIAGNOSIS — R251 Tremor, unspecified: Secondary | ICD-10-CM | POA: Diagnosis not present

## 2017-07-06 DIAGNOSIS — I5032 Chronic diastolic (congestive) heart failure: Secondary | ICD-10-CM

## 2017-07-06 DIAGNOSIS — S81811A Laceration without foreign body, right lower leg, initial encounter: Secondary | ICD-10-CM

## 2017-07-06 DIAGNOSIS — Z23 Encounter for immunization: Secondary | ICD-10-CM | POA: Diagnosis not present

## 2017-07-06 DIAGNOSIS — G250 Essential tremor: Secondary | ICD-10-CM

## 2017-07-06 DIAGNOSIS — I713 Abdominal aortic aneurysm, ruptured, unspecified: Secondary | ICD-10-CM

## 2017-07-06 DIAGNOSIS — N183 Chronic kidney disease, stage 3 unspecified: Secondary | ICD-10-CM

## 2017-07-06 DIAGNOSIS — Z820 Family history of epilepsy and other diseases of the nervous system: Secondary | ICD-10-CM | POA: Diagnosis not present

## 2017-07-06 NOTE — Progress Notes (Signed)
Subjective:  Kevin [name] is a 77 y.o. year old very pleasant male patient who presents for/with See problem oriented charting ROS- worsening tremor. No chest pain. No shortness of breath.  No fever or chills or expanding redness around skin  Past Medical History-  Patient Active Problem List   Diagnosis Date Noted  . PAD (peripheral artery disease) ([place]) 01/08/2016    Priority: High  . Diastolic CHF ([place]) 99/83/3825    Priority: High  . CKD (chronic kidney disease), stage III ([place]) 01/27/2014    Priority: High  . AAA (abdominal aortic aneurysm, ruptured) ([place]) 01/17/2013    Priority: High  . Carotid artery stenosis s/p L carotid endarterectomy 01/12/2012    Priority: High  . History of CVA (cerebrovascular accident) 11/17/2011    Priority: High  . Chronic pain syndrome 12/23/2009    Priority: High  . Personal history of colonic polyps 05/15/2013    Priority: Medium  . Essential tremor 10/05/2009    Priority: Medium  . UNSPECIFIED ANEMIA 12/05/2008    Priority: Medium  . BPH (benign prostatic hyperplasia) 06/04/2007    Priority: Medium  . Fasting hyperglycemia 02/05/2007    Priority: Medium  . Hyperlipidemia 01/03/2007    Priority: Medium  . Essential hypertension 01/03/2007    Priority: Medium  . CAP (community acquired pneumonia) 03/17/2015    Priority: Low  . Family history of malignant neoplasm of gastrointestinal tract 05/15/2013    Priority: Low  . Inguinal hernia 05/01/2012    Priority: Low  . BURSITIS, HIP 12/21/2009    Priority: Low  . ACTINIC KERATOSIS, EAR, LEFT 03/09/2009    Priority: Low  . Primary osteoarthritis of left knee 11/02/2007    Priority: Low  . 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 Lumbar spondylosis 03/17/2017    Medications- reviewed and updated Current Outpatient Medications  Medication Sig Dispense Refill  . amLODipine (NORVASC) 10 MG tablet Take 1 tablet (10 mg total) by mouth daily. 90 tablet 1  .  atorvastatin (LIPITOR) 20 MG tablet Take 1 tablet (20 mg total) by mouth daily. 90 tablet 1  . Cholecalciferol (VITAMIN D-3) 1000 UNITS CAPS Take 1 capsule by mouth daily.    . clopidogrel (PLAVIX) 75 MG tablet Take 1 tablet (75 mg total) by mouth daily. 90 tablet 1  . diazepam (VALIUM) 5 MG tablet Take 0.5-1 tablets (2.5-5 mg total) by mouth every 8 (eight) hours as needed for muscle spasms or sedation. 90 tablet 1  . fenofibrate 160 MG tablet Take 1 tablet (160 mg total) by mouth daily. 90 tablet 3  . furosemide (LASIX) 20 MG tablet Take 1 tablet (20 mg total) by mouth every other day. 90 tablet 1  . gabapentin (NEURONTIN) 300 MG capsule Take 1 capsule (300 mg total) by mouth 2 (two) times daily. 180 capsule 1  . Melatonin 3 MG TABS Take 5 mg by mouth at bedtime.     Marland Kitchen oxyCODONE-acetaminophen (PERCOCET) 7.5-325 MG tablet Take 1 tablet by mouth 2 (two) times daily as needed for severe pain. 60 tablet 0  . propranolol (INDERAL) 40 MG tablet Take 1 tablet (40 mg total) by mouth 2 (two) times daily. 180 tablet 3  . tamsulosin (FLOMAX) 0.4 MG CAPS capsule Take 1 capsule (0.4 mg total) by mouth daily. 90 capsule 3  . tiZANidine (ZANAFLEX) 4 MG tablet TAKE 1 TABLET BY MOUTH  EVERY 6 HOURS AS NEEDED FOR MUSCLE SPASMS 90 tablet 1  . vitamin  B-12 (CYANOCOBALAMIN) 1000 MCG tablet Take 1,000 mcg by mouth daily.     Objective: BP 130/70 (BP Location: Left Arm, Patient Position: Sitting, Cuff Size: Normal)   Pulse (!) 55   Temp 98 F (36.7 C) (Oral)   Ht 5\' 7"  (1.702 m)   Wt 179 lb 8 oz (81.4 kg)   SpO2 96%   BMI 28.11 kg/m  Gen: NAD, resting comfortably CV: RRR no murmurs rubs or gallops Lungs: CTAB no crackles, wheeze, rhonchi Abdomen: soft/nontender/nondistended/normal bowel sounds. overweight Ext: no edema Skin: warm, dry, healing laceration on right shin  Assessment/Plan:  Laceration of right lower leg, initial encounter S:On Tuesday-  Right leg laceration after bag of logs fell onto leg.  Area no longer bleeding but is tender to touch.  A/P: Minimal surrounding erythema- doubt cellulitis. Pain not severe enough to warrant imaging- doubt fracture. Will update Tdap  Essential tremor S:  tremor seems to be worsening- has been present since at least 2010. Right was the first side he noticed it but states left is far worse. Tremor worse when pain is worse. Tremor is worse for reaching for things/objects. Trouble cutting meats.   He is on propranolol which helps some. Alcohol helps but very rarely drinks A/P: I still suspect essential tremor. Wife would like neurology consult as she feels tremor is worsening and wants neurology's opinion about parkinsons- patients father died of parkinsons. No obvious cogwheel rigidity. Has had some balance issues. No obvious masked faces. Will refer today for 2nd opinion on this  Diastolic CHF ([place]) S: stable with Lasix 20mg  every other day. Weight down 6 lbs Wt Readings from Last 3 Encounters:  07/06/17 179 lb 8 oz (81.4 kg)  06/16/17 185 lb (83.9 kg)  06/01/17 185 lb 6.4 oz (84.1 kg)  A/P: appears euvolemic. is having some cramping in legs- told could use some sparing mustard. Also may trial magnesium OTC every other day.   CKD (chronic kidney disease), stage III GFR stable in mid 30s on last check- continue to check at least every 6 months. Also sees nephrology  AAA (abdominal aortic aneurysm, ruptured) [facility]) S/p repair 2010- no signs of recurrence  Future Appointments  Date Time Provider [place]  07/31/2017  8:20 AM Bayard Hugger, NP [facility]  08/25/2017  1:00 PM Gerda Diss, DO [facility]   6 months  Lab/Order associations: Tremor - Plan: Ambulatory referral to Neurology  Family history of Parkinson disease - Plan: Ambulatory referral to Neurology  Return precautions advised.  Garret Reddish, MD

## 2017-07-06 NOTE — Assessment & Plan Note (Signed)
S/p repair 2010- no signs of recurrence

## 2017-07-06 NOTE — Assessment & Plan Note (Signed)
GFR stable in mid 30s on last check- continue to check at least every 6 months. Also sees nephrology

## 2017-07-06 NOTE — Patient Instructions (Signed)
We will call you within a week or two about your referral to neurology. If you do not hear within 3 weeks, give [place] a call.   Td (tetanus shot) good for 10 years given today

## 2017-07-06 NOTE — Assessment & Plan Note (Signed)
S: stable with Lasix 20mg  every other day. Weight down 6 lbs Wt Readings from Last 3 Encounters:  07/06/17 179 lb 8 oz (81.4 kg)  06/16/17 185 lb (83.9 kg)  06/01/17 185 lb 6.4 oz (84.1 kg)  A/P: appears euvolemic. is having some cramping in legs- told could use some sparing mustard. Also may trial magnesium OTC every other day.

## 2017-07-06 NOTE — Assessment & Plan Note (Signed)
S:  tremor seems to be worsening- has been present since at least 2010. Right was the first side he noticed it but states left is far worse. Tremor worse when pain is worse. Tremor is worse for reaching for things/objects. Trouble cutting meats.   He is on propranolol which helps some. Alcohol helps but very rarely drinks A/P: I still suspect essential tremor. Wife would like neurology consult as she feels tremor is worsening and wants neurology's opinion about parkinsons- patients father died of parkinsons. No obvious cogwheel rigidity. Has had some balance issues. No obvious masked faces. Will refer today for 2nd opinion on this

## 2017-07-06 NOTE — Addendum Note (Signed)
Addended by: Marian Sorrow on: 07/06/2017 02:52 PM   Modules accepted: Orders

## 2017-07-07 ENCOUNTER — Encounter: Payer: Self-pay | Admitting: Neurology

## 2017-07-19 ENCOUNTER — Other Ambulatory Visit: Payer: Self-pay | Admitting: Family Medicine

## 2017-07-20 NOTE — Telephone Encounter (Signed)
Send him mychart asking if he knew he had a refill on the January fill? If he did and is on 2nd bottle- how often is he taking the medicine- appears to be too frequent

## 2017-07-24 NOTE — Telephone Encounter (Signed)
Message sent

## 2017-07-26 ENCOUNTER — Other Ambulatory Visit: Payer: Self-pay

## 2017-07-26 MED ORDER — DIAZEPAM 5 MG PO TABS: 2.5000 mg | ORAL_TABLET | Freq: Three times a day (TID) | ORAL | 1 refills | Status: DC | PRN

## 2017-07-30 ENCOUNTER — Other Ambulatory Visit: Payer: Self-pay | Admitting: Family Medicine

## 2017-07-31 ENCOUNTER — Encounter: Payer: Self-pay | Admitting: Registered Nurse

## 2017-07-31 ENCOUNTER — Encounter: Payer: Medicare HMO | Attending: Physical Medicine & Rehabilitation | Admitting: Registered Nurse

## 2017-07-31 VITALS — BP 115/68 | HR 58 | Resp 16

## 2017-07-31 DIAGNOSIS — I739 Peripheral vascular disease, unspecified: Secondary | ICD-10-CM | POA: Insufficient documentation

## 2017-07-31 DIAGNOSIS — M25561 Pain in right knee: Secondary | ICD-10-CM | POA: Diagnosis not present

## 2017-07-31 DIAGNOSIS — G894 Chronic pain syndrome: Secondary | ICD-10-CM

## 2017-07-31 DIAGNOSIS — M25551 Pain in right hip: Secondary | ICD-10-CM | POA: Insufficient documentation

## 2017-07-31 DIAGNOSIS — M1712 Unilateral primary osteoarthritis, left knee: Secondary | ICD-10-CM | POA: Insufficient documentation

## 2017-07-31 DIAGNOSIS — Z5181 Encounter for therapeutic drug level monitoring: Secondary | ICD-10-CM | POA: Diagnosis not present

## 2017-07-31 DIAGNOSIS — Z79899 Other long term (current) drug therapy: Secondary | ICD-10-CM

## 2017-07-31 DIAGNOSIS — G8929 Other chronic pain: Secondary | ICD-10-CM | POA: Diagnosis not present

## 2017-07-31 DIAGNOSIS — M25562 Pain in left knee: Secondary | ICD-10-CM

## 2017-07-31 DIAGNOSIS — M47816 Spondylosis without myelopathy or radiculopathy, lumbar region: Secondary | ICD-10-CM | POA: Insufficient documentation

## 2017-07-31 MED ORDER — OXYCODONE-ACETAMINOPHEN 7.5-325 MG PO TABS: 1.0000 | ORAL_TABLET | Freq: Two times a day (BID) | ORAL | 0 refills | Status: DC | PRN

## 2017-07-31 NOTE — Progress Notes (Signed)
Subjective:    Patient ID: Kevin [name], male    DOB: 08/31/1940, 77 y.o.   MRN: [id number]  HPI : [name] is a 77 year old male who returns for follow up appointment and medication initiation. He states his pain is located in his lower back and bilateral knees L>R. He rates his pain 5. His current exercise regime is walking and performing light household chores.  Kevin [name] forgot his Oxycodone at home, reviewed the narcotic policy, he understands if this happens again we won't be able to prescribe until he brings in his medication. He verbalizes understanding. Also wrote this on his AVS, he's going to ask his wife to remind him. He states at the other Pain Management  office he  was going to he didn't have to bring in the medication, reviewed the policy he verbalizes understanding.   Kevin [name] Morphine equivalent is 21.00 MME. He is also prescribed Diazepam by Dr. [name] .We have discussed the black box warning of using opioids and benzodiazepines. I highlighted the dangers of using these drugs together and discussed the adverse events including respiratory suppression, overdose, cognitive impairment and importance of  compliance with current regimen. He verbalizes understanding, we will continue to monitor and adjust as indicated.    Kevin [name] Oral Swab was Performed on 06/16/2017, it was consistent.   Pain Inventory Average Pain 5 Pain Right Now 5 My pain is aching  In the last 24 hours, has pain interfered with the following? General activity 4 Relation with others 9 Enjoyment of life 9 What TIME of day is your pain at its worst? morning and evening  Sleep (in general) Good  Pain is worse with: walking and standing Pain improves with: rest, pacing activities, medication and injections Relief from Meds: 5  Mobility Do you have any goals in this area?  no  Function retired I need assistance with the following:  meal prep Do you have any goals in this area?   no  Neuro/Psych spasms  Prior Studies Any changes since last visit?  no  Physicians involved in your care Any changes since last visit?  no   Family History  Problem Relation Age of Onset  . Parkinsonism Father   . Dementia Father   . Cancer Father        Throat  . Colon cancer Mother        died in 24s  . Cancer Mother        Colon   Social History   Socioeconomic History  . Marital status: Married    Spouse name: None  . Number of children: None  . Years of education: None  . Highest education level: None  Social Needs  . Financial resource strain: None  . Food insecurity - worry: None  . Food insecurity - inability: None  . Transportation needs - medical: None  . Transportation needs - non-medical: None  Occupational History  . None  Tobacco Use  . Smoking status: Former Smoker    Packs/day: 2.00    Years: 28.00    Pack years: 56.00    Types: Cigarettes    Last attempt to quit: 05/31/1987    Years since quitting: 30.1  . Smokeless tobacco: Never Used  . Tobacco comment: quit smoking "in my 67's"  Substance and Sexual Activity  . Alcohol use: No    Alcohol/week: 0.0 oz  . Drug use: No    Comment: hx of-4-5 yrs ago  .  Sexual activity: Not Currently  Other Topics Concern  . None  Social History Narrative   Married 33 years in 2015. Lived together for 12 years. 2 boys from first wife. 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       Hobbies: yardwork, [place] barn, woodwork, Orthoptist   Past Surgical History:  Procedure Laterality Date  . ABDOMINAL AORTIC ANEURYSM REPAIR  2010  . CAROTID ENDARTERECTOMY Left 09/20/2004  . COLONOSCOPY    . ENDARTERECTOMY Right 10/12/2015   Procedure: RIGHT CAROTID ENDARTERECTOMY WITH PATCH ANGIOPLASTY;  Surgeon: Elam Dutch, MD;  Location: [place];  Service: Vascular;  Laterality: Right;  . INGUINAL  HERNIA REPAIR Left 02/05/2013   Procedure: HERNIA REPAIR INGUINAL ADULT;  Surgeon: Joyice Faster. Cornett, MD;  Location: [facility];  Service: General;  Laterality: Left;  . INGUINAL HERNIA REPAIR Left   . INSERTION OF MESH Left 02/05/2013   Procedure: INSERTION OF MESH;  Surgeon: Joyice Faster. Cornett, MD;  Location: [place];  Service: General;  Laterality: Left;  . REFRACTIVE SURGERY     2016-2017  . SHOULDER ARTHROSCOPY WITH ROTATOR CUFF REPAIR AND SUBACROMIAL DECOMPRESSION Left 05/01/2014   Procedure: LEFT ARTHROSCOPY SHOULDER SUBACROMIAL DECOMPRESSION,DISTAL CLAVICAL RESECTION AND ROTATOR CUFF REPAIR;  Surgeon: Marin Shutter, MD;  Location: [place];  Service: Orthopedics;  Laterality: Left;  . TESTICLAR CYST EXCISION Left   . TONSILLECTOMY    . VASECTOMY     Past Medical History:  Diagnosis Date  . Arthritis    DDD- Lower Back  . Bradycardia   . CAP (community acquired pneumonia) 03/17/2015   "THAT'S WHAT THEY ARE THINKING; THEY ARE NOT SURE"  . Carotid artery occlusion   . Chronic lower back pain    DDD  . CKD (chronic kidney disease)    Dr. Corliss Parish  . Diverticulosis   . Enlarged prostate    takes Flomax daily  . History of blood transfusion    "probably when they did aortic aneurysm"  . History of colon polyps    benign  . Hyperlipidemia   . Hypertension    takes Amlodipine and Zebeta daily  . Insomnia    takes Melatonin nightly  . Joint pain   . 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     . Muscle spasm    takes Zanaflex daily as needed  . Peripheral edema    takes Furosemide daily  . Pneumonia    hx of   . Rheumatic fever 1946  . Rheumatoid arthritis ([place])   . Short-term memory loss    minimal  . Shortness of breath dyspnea    with exertion but lasts very short period of time  . TIA (transient ischemic attack)    x 2;takes Plavix daily  . Urinary frequency    takes Flomax daily  . Urinary urgency     BP 115/68   Pulse (!) 54   Resp 16   SpO2 95%   Opioid Risk Score:   4 Fall Risk Score:  `1  Depression screen PHQ 2/9  Depression screen [facility] 2/9 07/03/2017 09/08/2016 10/20/2015 07/02/2015 03/26/2015 09/22/2014 06/24/2013  Decreased Interest 0 0 0 0 0 0 1  Down, Depressed, Hopeless 0 0 0 0 0 0 0  PHQ - 2 Score 0 0 0 0  0 0 1  Some recent data might be hidden     Review of Systems  Constitutional: Negative.   HENT: Negative.   Respiratory: Negative.   Cardiovascular: Negative.   Gastrointestinal: Negative.   Endocrine: Negative.   Musculoskeletal: Positive for back pain.  Skin: Negative.   Allergic/Immunologic: Negative.   Neurological:       Spasams  Hematological: Negative.   Psychiatric/Behavioral: Negative.   All other systems reviewed and are negative.      Objective:   Physical Exam  Constitutional: He is oriented to person, place, and time. He appears well-developed and well-nourished.  HENT:  Head: Normocephalic and atraumatic.  Neck: Normal range of motion. Neck supple.  Cardiovascular: Normal rate and regular rhythm.  Pulmonary/Chest: Effort normal and breath sounds normal.  Musculoskeletal:  Normal Muscle Bulk and Muscle Testing Reveals: Upper Extremities: Full ROM and Muscle Strength 5/5 Lumbar Paraspinal Tenderness: L-4-L-5 Lower Extremities: Full  ROM and Muscle Strength 5/5 Right Lower Extremity Flexion Produces Pain into Lumbar Left Lower Extremity Flexion Produces Pain into Left Patella Arises from chair with ease Narrow Based Gait  Neurological: He is alert and oriented to person, place, and time.  Skin: Skin is warm and dry.  Psychiatric: He has a normal mood and affect.  Nursing note and vitals reviewed.         Assessment & Plan:  1. Lumbar Spondylosis/ Lumbar Stenosis: Refdilled: Oxycodone 7.5/325 mg one tablet twice daily as needed for severe pain #60. 07/31/2017 We will continue the opioid monitoring program, this consists of  regular clinic visits, examinations, urine drug screen, pill counts as well as use of [place] Controlled Substance Reporting System.  2. Chronic Bilateral Knee Pain: Orthopedist Following Dr. Paulla Fore. 07/31/2017  F/U in 1 month

## 2017-07-31 NOTE — Patient Instructions (Addendum)
Please Bring Oxycodone with you to every visit, if you don't have it next month we won't be able to prescribe until you bring it in.  We count your medication every time you come to our office.  Set a reminder the night Before , to help you remember  Call with any questions [phone]

## 2017-08-01 ENCOUNTER — Ambulatory Visit: Payer: Medicare HMO | Admitting: Neurology

## 2017-08-03 NOTE — Progress Notes (Signed)
Kevin [name] was seen today in the movement disorders clinic for neurologic consultation at the request of Marin Olp, MD.  The consultation is for the evaluation of tremor.  The records that were made available to me were reviewed.  This patient is accompanied in the office by his spouse who supplements the history.    Tremor: Yes.     How long has it been going on? R hand at least since 2010.  Was just minor then and saw Dr. 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  L hand started after L shoulder sx in about 2013.  L hand has gotten worse, esp over the last 6 months.  Previously worse with just pain and now more frequent.  The harder he tries to relax it the worse it is.  At rest or with activation?  Both, but worse with activation  Fam hx of tremor?  Yes.  Father had PD  Located where?  Both hands  Affected by caffeine:  No. (2 cups coffee in the AM, 2 cokes/pepsi per day)  Affected by alcohol: unknown (doesn't drink much EtOH; drinks some Mikes hard lemonade and not sure if it changes tremor)  Affected by stress:  Yes.    Affected by fatigue:  Yes.    Spills soup if on spoon:  Yes.  , because R hand isn't as bad  Spills glass of liquid if full:  No.  Affects ADL's (tying shoes, brushing teeth, etc):  No.  Specific Symptoms:  Voice: softer per wife Sleep: generally sleeps "like a log"  Vivid Dreams:  No.  Acting out dreams:  No. Wet Pillows: No. Postural symptoms:  Yes.    Falls?  No. Bradykinesia symptoms: difficulty getting out of a chair Loss of smell:  Yes.  , but still can smell Loss of taste:  Unknown - things don't always taste the same Urinary Incontinence:  No. Difficulty Swallowing:  No. Handwriting, micrographia: Yes.   Trouble with ADL's:  No. (some occasional trouble because of tremor)  Trouble buttoning clothing: No. Depression:  No. per pt; some per wife if she is at work Memory changes:  Yes.  , but "it has never good."  Does prepare own pill box.  Wife  has always done monthly finances.   Hallucinations:  rarely  visual distortions: Yes.   N/V:  No. Lightheaded:  No.  Syncope: No. Diplopia:  No. Dyskinesia:  No.    MRI of the brain was done in May, 2016.  I have reviewed this.  This demonstrated cerebral small vessel disease and small chronic locations in the right frontal lobe, right parietal lobe, left parietal lobe.  Patient was seen in the hospital in 2013 by Dr. Doy Mince for acute stroke.  PREVIOUS MEDICATIONS: propranolol 40 mg bid (he takes that for BP per pt)  ALLERGIES:  No Known Allergies  CURRENT MEDICATIONS:  Outpatient Encounter Medications as of 08/07/2017  Medication Sig  . amLODipine (NORVASC) 10 MG tablet Take 1 tablet (10 mg total) by mouth daily.  Marland Kitchen atorvastatin (LIPITOR) 20 MG tablet Take 1 tablet (20 mg total) by mouth daily.  . Cholecalciferol (VITAMIN D-3) 1000 UNITS CAPS Take 1 capsule by mouth daily.  . clopidogrel (PLAVIX) 75 MG tablet Take 1 tablet (75 mg total) by mouth daily.  . diazepam (VALIUM) 5 MG tablet Take 0.5-1 tablets (2.5-5 mg total) by mouth every 8 (eight) hours as needed for muscle spasms or sedation.  . fenofibrate 160 MG  tablet Take 1 tablet (160 mg total) by mouth daily.  . furosemide (LASIX) 20 MG tablet Take 1 tablet (20 mg total) by mouth every other day.  . gabapentin (NEURONTIN) 300 MG capsule Take 1 capsule (300 mg total) by mouth 2 (two) times daily.  . Melatonin 3 MG TABS Take 5 mg by mouth at bedtime.   Marland Kitchen oxyCODONE-acetaminophen (PERCOCET) 7.5-325 MG tablet Take 1 tablet by mouth 2 (two) times daily as needed for severe pain.  Marland Kitchen propranolol (INDERAL) 40 MG tablet Take 1 tablet (40 mg total) by mouth 2 (two) times daily.  . tamsulosin (FLOMAX) 0.4 MG CAPS capsule Take 1 capsule (0.4 mg total) by mouth daily.  Marland Kitchen tiZANidine (ZANAFLEX) 4 MG tablet TAKE 1 TABLET BY MOUTH  EVERY 6 HOURS AS NEEDED FOR MUSCLE SPASMS  . triamcinolone cream (KENALOG) 0.1 % APPLY 1 APPLICATION 2 TIMES DAILY  TOPICALLY FOR 7-10 DAYS  . vitamin B-12 (CYANOCOBALAMIN) 1000 MCG tablet Take 1,000 mcg by mouth daily.   No facility-administered encounter medications on file as of 08/07/2017.     PAST MEDICAL HISTORY:   Past Medical History:  Diagnosis Date  . Arthritis    DDD- Lower Back  . Bradycardia   . CAP (community acquired pneumonia) 03/17/2015   "THAT'S WHAT THEY ARE THINKING; THEY ARE NOT SURE"  . Carotid artery occlusion   . Chronic lower back pain    DDD  . CKD (chronic kidney disease)    Dr. Corliss Parish  . Diverticulosis   . Enlarged prostate    takes Flomax daily  . History of blood transfusion    "probably when they did aortic aneurysm"  . History of colon polyps    benign  . Hyperlipidemia   . Hypertension    takes Amlodipine and Zebeta daily  . Insomnia    takes Melatonin nightly  . Joint pain   . 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     . Muscle spasm    takes Zanaflex daily as needed  . Peripheral edema    takes Furosemide daily  . Pneumonia    hx of   . Rheumatic fever 1946  . Rheumatoid arthritis ([place])   . Short-term memory loss    minimal  . Shortness of breath dyspnea    with exertion but lasts very short period of time  . TIA (transient ischemic attack)    x 2;takes Plavix daily  . Urinary frequency    takes Flomax daily  . Urinary urgency     PAST SURGICAL HISTORY:   Past Surgical History:  Procedure Laterality Date  . ABDOMINAL AORTIC ANEURYSM REPAIR  2010  . CAROTID ENDARTERECTOMY Left 09/20/2004  . COLONOSCOPY    . ENDARTERECTOMY Right 10/12/2015   Procedure: RIGHT CAROTID ENDARTERECTOMY WITH PATCH ANGIOPLASTY;  Surgeon: Elam Dutch, MD;  Location: [facility];  Service: Vascular;  Laterality: Right;  . INGUINAL HERNIA REPAIR Left 02/05/2013   Procedure: HERNIA REPAIR INGUINAL ADULT;  Surgeon: Joyice Faster. Cornett, MD;  Location: [facility];  Service: General;  Laterality: Left;  . INGUINAL HERNIA  REPAIR Left   . INSERTION OF MESH Left 02/05/2013   Procedure: INSERTION OF MESH;  Surgeon: Joyice Faster. Cornett, MD;  Location: [facility];  Service: General;  Laterality: Left;  . REFRACTIVE SURGERY     2016-2017  . SHOULDER ARTHROSCOPY WITH ROTATOR CUFF REPAIR AND SUBACROMIAL DECOMPRESSION Left 05/01/2014   Procedure: LEFT ARTHROSCOPY SHOULDER SUBACROMIAL DECOMPRESSION,DISTAL  CLAVICAL RESECTION AND ROTATOR CUFF REPAIR;  Surgeon: Marin Shutter, MD;  Location: [facility];  Service: Orthopedics;  Laterality: Left;  . TESTICLAR CYST EXCISION Left   . TONSILLECTOMY    . VASECTOMY      SOCIAL HISTORY:   Social History   Socioeconomic History  . Marital status: Married    Spouse name: Not on file  . Number of children: Not on file  . Years of education: Not on file  . Highest education level: Not on file  Social Needs  . Financial resource strain: Not on file  . Food insecurity - worry: Not on file  . Food insecurity - inability: Not on file  . Transportation needs - medical: Not on file  . Transportation needs - non-medical: Not on file  Occupational History  . Not on file  Tobacco Use  . Smoking status: Former Smoker    Packs/day: 2.00    Years: 28.00    Pack years: 56.00    Types: Cigarettes    Last attempt to quit: 05/31/1987    Years since quitting: 30.2  . Smokeless tobacco: Never Used  . Tobacco comment: quit smoking "in my 73's"  Substance and Sexual Activity  . Alcohol use: No    Alcohol/week: 0.0 oz  . Drug use: No    Comment: hx of-4-5 yrs ago  . Sexual activity: Not Currently  Other Topics Concern  . Not on file  Social History Narrative   Married 33 years in 2015. Lived together for 12 years. 2 boys from first wife. 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       Hobbies: yardwork, [place] barn, woodwork, Orthoptist    FAMILY HISTORY:    Family Status  Relation Name Status  . Father  Deceased at age 61's  . Mother  Deceased at age 12s  . Son x2 Alive    ROS: chronic low back pain.   A complete 10 system review of systems was obtained and was unremarkable apart from what is mentioned above.  PHYSICAL EXAMINATION:    VITALS:   Vitals:   08/07/17 0944  BP: 100/60  Pulse: (!) 56  SpO2: 95%  Weight: 179 lb (81.2 kg)  Height: 5\' 7"  (1.702 m)    GEN:  The patient appears stated age and is in NAD. HEENT:  Normocephalic, atraumatic.  The mucous membranes are moist. The superficial temporal arteries are without ropiness or tenderness. CV:  RRR Lungs:  CTAB Neck/HEME:  There are no carotid bruits bilaterally.  Neurological examination:  Orientation: The patient is alert and oriented x3. Fund of knowledge is appropriate.  Recent and remote memory are intact.  Attention and concentration are normal.    Able to name objects and repeat phrases. Cranial nerves: There is good facial symmetry. Pupils are equal round and reactive to light bilaterally. Fundoscopic exam reveals clear margins bilaterally. Extraocular muscles are intact. The visual fields are full to confrontational testing. The speech is fluent and clear. Soft palate rises symmetrically and there is no tongue deviation. Hearing is intact to conversational tone. Sensation: Sensation is intact to light and pinprick throughout (facial, trunk, extremities). Vibration is intact at the bilateral big toe. There is no extinction with double simultaneous stimulation. There is no sensory dermatomal level identified. Motor: Strength is 5/5 in the bilateral upper  and lower extremities.   Shoulder shrug is equal and symmetric.  There is no pronator drift. Deep tendon reflexes: Deep tendon reflexes are 3+/4 at the bilateral biceps, triceps, brachioradialis, patella and achilles. There are pectoralis reflexes bilaterally.  There are cross adductor reflexes.  Plantar responses are  downgoing bilaterally.  Movement examination: Tone: There is normal tone in the bilateral upper extremities.  The tone in the lower extremities is normal.  Abnormal movements: There is a resting tremor on the R with distraction.  There is very severe intention tremor on the L.  There is mild intention tremor on the right.  Tremor is noted, albeit mild, with Archimedes spirals on the right.  He has moderate trouble with Archimedes spirals on the left.  The biggest trouble is getting the pen on the paper. Coordination:  There is mild decremation with RAM's, with hand opening and closing on the left and finger taps on the left, but tremor interferes with these actions causing the difficulty.  All other rapid alternating movements are normal..   Gait and Station: The patient has no difficulty arising out of a deep-seated chair without the use of the hands. The patient's stride length is slightly decreased.  He is wide based and slightly drags the L leg.    Lab Results  Component Value Date   TSH 0.96 04/17/2017     Chemistry      Component Value Date/Time   NA 138 04/17/2017 1603   NA 136 (A) 01/27/2015   K 4.2 04/17/2017 1603   CL 104 04/17/2017 1603   CO2 27 04/17/2017 1603   BUN 24 (H) 04/17/2017 1603   BUN 32 (A) 01/27/2015   CREATININE 1.96 (H) 04/17/2017 1603   GLU 105 01/27/2015      Component Value Date/Time   CALCIUM 9.3 04/17/2017 1603   ALKPHOS 30 (L) 04/17/2017 1603   AST 25 04/17/2017 1603   ALT 18 04/17/2017 1603   BILITOT 0.6 04/17/2017 1603       ASSESSMENT/PLAN:  1.  Essential Tremor.  - We discussed nature and pathophysiology.  We discussed that this can continue to gradually get worse with time.  We discussed that some medications can worsen this, as can caffeine use.  We discussed medication therapy as well as surgical therapy.  Discussed with him that his essential tremor is to the degree that he would need surgery if we are to expect resolution of tremor.  He  is pretty adamant that he does not want surgery.  The severe tremor in the left hand is his nondominant hand.  Ultimately, the patient decided to try primidone.  He will require bigger dosages than the starting dose due to severity of tremor.  We will call him in a few weeks and see how he is doing.  Risks, benefits, side effects and alternative therapies were discussed.  The opportunity to ask questions was given and they were answered to the best of my ability.  The patient expressed understanding and willingness to follow the outlined treatment protocols.  2.  History of stroke in 2013  -Records were reviewed.  MRI was confirmative.  Patient now on Plavix (was on aspirin prior to the event).  Pt on lipitor.  3.  Significant diffuse hyperreflexia.    -May be due to his history of stroke, but abnormality in the cervical spine cannot be ruled out.  The patient is very clear in saying that no matter what would be found in his neck (  or back), he would not have surgery.  Therefore, we opted not to scan him.  4.  Follow up is anticipated in the next few months, sooner should new neurologic issues arise.  Much greater than 50% of this visit was spent in counseling and coordinating care.  Total face to face time:  45 min  Cc:  Marin Olp, MD

## 2017-08-07 ENCOUNTER — Encounter: Payer: Self-pay | Admitting: Neurology

## 2017-08-07 ENCOUNTER — Ambulatory Visit: Payer: Medicare HMO | Admitting: Neurology

## 2017-08-07 VITALS — BP 100/60 | HR 56 | Ht 67.0 in | Wt 179.0 lb

## 2017-08-07 DIAGNOSIS — G250 Essential tremor: Secondary | ICD-10-CM | POA: Diagnosis not present

## 2017-08-07 DIAGNOSIS — R292 Abnormal reflex: Secondary | ICD-10-CM | POA: Diagnosis not present

## 2017-08-07 MED ORDER — PRIMIDONE 50 MG PO TABS: 50.0000 mg | ORAL_TABLET | Freq: Two times a day (BID) | ORAL | 0 refills | Status: DC

## 2017-08-07 NOTE — Patient Instructions (Signed)
1.  Start primidone - 1/2 tablet at bedtime for 4 nights.  Then increase to 1 tablet at night.  We will call you in 2 weeks and likely will increase the dosage at that time

## 2017-08-15 ENCOUNTER — Telehealth: Payer: Self-pay | Admitting: Neurology

## 2017-08-15 MED ORDER — TOPIRAMATE 100 MG PO TABS: 100.0000 mg | ORAL_TABLET | Freq: Every day | ORAL | 0 refills | Status: DC

## 2017-08-15 MED ORDER — TOPIRAMATE 25 MG PO TABS: ORAL_TABLET | ORAL | 0 refills | Status: DC

## 2017-08-15 NOTE — Telephone Encounter (Signed)
He is already on propranolol and dose cannot be increased due to bradycardia/low BP.  Options are limited.  Could try topamax as second line medication if he wants but as I told him in the office, is degree of tremor not likely going to be well controlled even with first line meds.  If wants try topamax 25 mg1 tablet daily for 1 week, then 2 tablets daily for 1 week and then 3 tablets daily for 1 week and then 100 mg topamax q day.  Make sure no hx of kidney stones (I looked in hx and didn't see that)

## 2017-08-15 NOTE — Telephone Encounter (Signed)
Spoke with patient who states he is having side effects from Primidone.   He took medication - 1/2 tablet for 4 nights, then took one whole tablet Sunday AM, he felt like a zombie all day and realized he was only supposed to take at night, and went back to one tablet at night last night.   He states he is feeling very lethargic since starting the medication. He is off balance and just generally doesn't feel good. I told him if he does not want to take the medication it is okay to stop.   He is interested in an alternative for tremor treatment. Please advise.

## 2017-08-15 NOTE — Telephone Encounter (Signed)
Spoke with patient and relayed Dr. Doristine Devoid message. He would like to try Topamax. RX sent to pharmacy.

## 2017-08-22 ENCOUNTER — Telehealth: Payer: Self-pay | Admitting: Family Medicine

## 2017-08-22 NOTE — Telephone Encounter (Signed)
Copied from [place]. Topic: Quick Communication - See Telephone Encounter >> Aug 22, 2017  9:11 AM Bea Graff, NT wrote: CRM for notification. See Telephone encounter for: 08/22/17. Pt states that he believes that the drs office has the wrong fax number for the [facility] and he wanted to provide the correct fax number: [phone]. He states that he doesn't need a refill right now but wanted to have this on file for the future.

## 2017-08-22 NOTE — Telephone Encounter (Signed)
noted 

## 2017-08-25 ENCOUNTER — Ambulatory Visit: Payer: Medicare HMO | Admitting: Sports Medicine

## 2017-08-25 ENCOUNTER — Emergency Department (HOSPITAL_COMMUNITY): Payer: Medicare HMO

## 2017-08-25 ENCOUNTER — Emergency Department (HOSPITAL_COMMUNITY)
Admission: EM | Admit: 2017-08-25 | Discharge: 2017-08-25 | Disposition: A | Discharge status: Home / Self Care | Payer: Medicare HMO | Attending: Emergency Medicine | Admitting: Emergency Medicine

## 2017-08-25 ENCOUNTER — Encounter (HOSPITAL_COMMUNITY): Payer: Self-pay | Admitting: Emergency Medicine

## 2017-08-25 DIAGNOSIS — K8020 Calculus of gallbladder without cholecystitis without obstruction: Secondary | ICD-10-CM | POA: Diagnosis not present

## 2017-08-25 DIAGNOSIS — R109 Unspecified abdominal pain: Secondary | ICD-10-CM | POA: Diagnosis not present

## 2017-08-25 DIAGNOSIS — Z79899 Other long term (current) drug therapy: Secondary | ICD-10-CM | POA: Diagnosis not present

## 2017-08-25 DIAGNOSIS — Z87891 Personal history of nicotine dependence: Secondary | ICD-10-CM | POA: Insufficient documentation

## 2017-08-25 DIAGNOSIS — I5030 Unspecified diastolic (congestive) heart failure: Secondary | ICD-10-CM | POA: Diagnosis not present

## 2017-08-25 DIAGNOSIS — Z7902 Long term (current) use of antithrombotics/antiplatelets: Secondary | ICD-10-CM | POA: Insufficient documentation

## 2017-08-25 DIAGNOSIS — N183 Chronic kidney disease, stage 3 (moderate): Secondary | ICD-10-CM | POA: Insufficient documentation

## 2017-08-25 DIAGNOSIS — R1032 Left lower quadrant pain: Secondary | ICD-10-CM | POA: Diagnosis not present

## 2017-08-25 DIAGNOSIS — I130 Hypertensive heart and chronic kidney disease with heart failure and stage 1 through stage 4 chronic kidney disease, or unspecified chronic kidney disease: Secondary | ICD-10-CM | POA: Insufficient documentation

## 2017-08-25 DIAGNOSIS — M25552 Pain in left hip: Secondary | ICD-10-CM | POA: Diagnosis not present

## 2017-08-25 LAB — URINALYSIS, ROUTINE W REFLEX MICROSCOPIC
BILIRUBIN URINE: NEGATIVE
Glucose, UA: NEGATIVE mg/dL
Hgb urine dipstick: NEGATIVE
KETONES UR: NEGATIVE mg/dL
Leukocytes, UA: NEGATIVE
NITRITE: NEGATIVE
Protein, ur: NEGATIVE mg/dL
SPECIFIC GRAVITY, URINE: 1.016 (ref 1.005–1.030)
pH: 6 (ref 5.0–8.0)

## 2017-08-25 NOTE — ED Notes (Signed)
Patient attempted to provide urine sample but was not able to.

## 2017-08-25 NOTE — ED Notes (Signed)
Patient transported to CT 

## 2017-08-25 NOTE — ED Provider Notes (Addendum)
[facility] DEPT Provider Note   CSN: [id number] Arrival date & time: 08/25/17  1025     History   Chief Complaint No chief complaint on file.   HPI Kevin [name] is a 77 y.o. male.  77 year old male presents with intermittent right as well as left flank pain which is been colicky in nature times several weeks.  Pain does not radiate down his legs.  No associated hematuria or dysuria.  No fever or chills.  No vomiting or diarrhea.  No prior history of renal colic.  He is currently pain-free at this time.  Having scheduled to see his doctor today for similar symptoms but his appointment was canceled.  Pain is somewhat worse with standing and somewhat positional and makes him short of breath at times but he denies any pleuritic component to it.  No cough or congestion.  No anginal chest pain.  Was told to come here by his physician.     Past Medical History:  Diagnosis Date  . Arthritis    DDD- Lower Back  . Bradycardia   . CAP (community acquired pneumonia) 03/17/2015   "THAT'S WHAT THEY ARE THINKING; THEY ARE NOT SURE"  . Carotid artery occlusion   . Chronic lower back pain    DDD  . CKD (chronic kidney disease)    Dr. Corliss Parish  . Diverticulosis   . Enlarged prostate    takes Flomax daily  . History of blood transfusion    "probably when they did aortic aneurysm"  . History of colon polyps    benign  . Hyperlipidemia   . Hypertension    takes Amlodipine and Zebeta daily  . Insomnia    takes Melatonin nightly  . Joint pain   . 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     . Muscle spasm    takes Zanaflex daily as needed  . Peripheral edema    takes Furosemide daily  . Pneumonia    hx of   . Rheumatic fever 1946  . Rheumatoid arthritis ([place])   . Short-term memory loss    minimal  . Shortness of breath dyspnea    with exertion but lasts very short period of time  . TIA (transient ischemic attack)    x 2;takes Plavix daily  . Urinary frequency    takes Flomax daily  . Urinary urgency     Patient Active Problem List   Diagnosis Date Noted  . Lumbar spondylosis 03/17/2017  . PAD (peripheral artery disease) ([place]) 01/08/2016  . Diastolic CHF ([place]) 11/94/1740  . CAP (community acquired pneumonia) 03/17/2015  . CKD (chronic kidney disease), stage III ([place]) 01/27/2014  . Family history of malignant neoplasm of gastrointestinal tract 05/15/2013  . Personal history of colonic polyps 05/15/2013  . AAA (abdominal aortic aneurysm, ruptured) ([place]) 01/17/2013  . Inguinal hernia 05/01/2012  . Carotid artery stenosis s/p L carotid endarterectomy 01/12/2012  . History of CVA (cerebrovascular accident) 11/17/2011  . Chronic pain syndrome 12/23/2009  . BURSITIS, HIP 12/21/2009  . Essential tremor 10/05/2009  . ACTINIC KERATOSIS, EAR, LEFT 03/09/2009  . UNSPECIFIED ANEMIA 12/05/2008  . Primary osteoarthritis of left knee 11/02/2007  . 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 BPH (benign prostatic hyperplasia) 06/04/2007  . Fasting hyperglycemia 02/05/2007  . Hyperlipidemia 01/03/2007  . Essential hypertension 01/03/2007    Past Surgical History:  Procedure Laterality Date  . ABDOMINAL AORTIC ANEURYSM REPAIR  2010  . CAROTID ENDARTERECTOMY Left 09/20/2004  .  CATARACT EXTRACTION, BILATERAL     2016-2017  . COLONOSCOPY    . ENDARTERECTOMY Right 10/12/2015   Procedure: RIGHT CAROTID ENDARTERECTOMY WITH PATCH ANGIOPLASTY;  Surgeon: Elam Dutch, MD;  Location: [facility];  Service: Vascular;  Laterality: Right;  . INGUINAL HERNIA REPAIR Left 02/05/2013   Procedure: HERNIA REPAIR INGUINAL ADULT;  Surgeon: Joyice Faster. Cornett, MD;  Location: [facility];  Service: General;  Laterality: Left;  . INGUINAL HERNIA REPAIR Left   . INSERTION OF MESH Left 02/05/2013   Procedure: INSERTION OF MESH;  Surgeon: Joyice Faster. Cornett, MD;  Location: [place];  Service: General;   Laterality: Left;  . SHOULDER ARTHROSCOPY WITH ROTATOR CUFF REPAIR AND SUBACROMIAL DECOMPRESSION Left 05/01/2014   Procedure: LEFT ARTHROSCOPY SHOULDER SUBACROMIAL DECOMPRESSION,DISTAL CLAVICAL RESECTION AND ROTATOR CUFF REPAIR;  Surgeon: Marin Shutter, MD;  Location: [facility];  Service: Orthopedics;  Laterality: Left;  . TESTICLAR CYST EXCISION Left   . TONSILLECTOMY    . VASECTOMY          Home Medications    Prior to Admission medications   Medication Sig Start Date End Date Taking? Authorizing Provider  amLODipine (NORVASC) 10 MG tablet Take 1 tablet (10 mg total) by mouth daily. 06/08/17  Yes Marin Olp, MD  atorvastatin (LIPITOR) 20 MG tablet Take 1 tablet (20 mg total) by mouth daily. 06/08/17  Yes Marin Olp, MD  clopidogrel (PLAVIX) 75 MG tablet Take 1 tablet (75 mg total) by mouth daily. 06/08/17  Yes Marin Olp, MD  diazepam (VALIUM) 5 MG tablet Take 0.5-1 tablets (2.5-5 mg total) by mouth every 8 (eight) hours as needed for muscle spasms or sedation. 07/26/17  Yes Marin Olp, MD  fenofibrate 160 MG tablet Take 1 tablet (160 mg total) by mouth daily. 06/08/17  Yes Marin Olp, MD  furosemide (LASIX) 20 MG tablet Take 1 tablet (20 mg total) by mouth every other day. 06/08/17  Yes Marin Olp, MD  gabapentin (NEURONTIN) 300 MG capsule Take 1 capsule (300 mg total) by mouth 2 (two) times daily. 06/08/17  Yes Marin Olp, MD  oxyCODONE-acetaminophen (PERCOCET) 7.5-325 MG tablet Take 1 tablet by mouth 2 (two) times daily as needed for severe pain. 07/31/17  Yes Bayard Hugger, NP  propranolol (INDERAL) 40 MG tablet Take 1 tablet (40 mg total) by mouth 2 (two) times daily. 06/08/17  Yes Marin Olp, MD  tamsulosin (FLOMAX) 0.4 MG CAPS capsule Take 1 capsule (0.4 mg total) by mouth daily. Patient taking differently: Take 0.4 mg by mouth every evening.  06/08/17  Yes Marin Olp, MD  Cholecalciferol (VITAMIN D-3) 1000 UNITS CAPS Take 1  capsule by mouth daily.    [provider]  Melatonin 3 MG TABS Take 5 mg by mouth at bedtime.     [provider]  tiZANidine (ZANAFLEX) 4 MG tablet TAKE 1 TABLET BY MOUTH  EVERY 6 HOURS AS NEEDED FOR MUSCLE SPASMS 06/08/17   Marin Olp, MD  topiramate (TOPAMAX) 100 MG tablet Take 1 tablet (100 mg total) by mouth daily. 08/15/17   Tat, Eustace Quail, DO  topiramate (TOPAMAX) 25 MG tablet 1 tablet QD 1 week, 2 tablets QD 1 week, 3 tablets QD 1 week, switch to 100 mg tablets 08/15/17   Tat, Eustace Quail, DO  triamcinolone cream (KENALOG) 0.1 % APPLY 1 APPLICATION 2 TIMES DAILY TOPICALLY FOR 7-10 DAYS 07/31/17   Marin Olp, MD  vitamin B-12 (CYANOCOBALAMIN) 1000 MCG tablet Take 1,000 mcg by mouth daily.    [provider]    Family History Family History  Problem Relation Age of Onset  . Parkinsonism Father   . Dementia Father   . Cancer Father        Throat  . Colon cancer Mother        died in 10s  . Cancer Mother        Colon    Social History Social History   Tobacco Use  . Smoking status: Former Smoker    Packs/day: 2.00    Years: 28.00    Pack years: 56.00    Types: Cigarettes    Last attempt to quit: 05/31/1987    Years since quitting: 30.2  . Smokeless tobacco: Never Used  . Tobacco comment: quit smoking "in my 66's"  Substance Use Topics  . Alcohol use: Yes    Alcohol/week: 0.0 oz    Comment: rare, mikes hard lemonade  . Drug use: No    Types: Marijuana    Comment: hx of-4-5 yrs ago     Allergies   Patient has no known allergies.   Review of Systems Review of Systems  All other systems reviewed and are negative.    Physical Exam Updated Vital Signs BP (!) 137/53 (BP Location: Right Arm)   Temp (!) 97.5 F (36.4 C) (Oral)   Resp 18   SpO2 99%   Physical Exam  Constitutional: He is oriented to person, place, and time. He appears well-developed and well-nourished.  Non-toxic appearance. No distress.  HENT:  Head:  Normocephalic and atraumatic.  Eyes: Pupils are equal, round, and reactive to light. Conjunctivae, EOM and lids are normal.  Neck: Normal range of motion. Neck supple. No tracheal deviation present. No thyroid mass present.  Cardiovascular: Normal rate, regular rhythm and normal heart sounds. Exam reveals no gallop.  No murmur heard. Pulmonary/Chest: Effort normal and breath sounds normal. No stridor. No respiratory distress. He has no decreased breath sounds. He has no wheezes. He has no rhonchi. He has no rales.  Abdominal: Soft. Normal appearance and bowel sounds are normal. He exhibits no distension. There is no tenderness. There is no rebound and no CVA tenderness.  Musculoskeletal: Normal range of motion. He exhibits no edema or tenderness.  Neurological: He is alert and oriented to person, place, and time. He has normal strength. No cranial nerve deficit or sensory deficit. GCS eye subscore is 4. GCS verbal subscore is 5. GCS motor subscore is 6.  Skin: Skin is warm and dry. No abrasion and no rash noted.  Psychiatric: He has a normal mood and affect. His speech is normal and behavior is normal.  Nursing note and vitals reviewed.    ED Treatments / Results  Labs (all labs ordered are listed, but only abnormal results are displayed) Labs Reviewed  URINE CULTURE  URINALYSIS, ROUTINE W REFLEX MICROSCOPIC    EKG None  Radiology No results found.  Procedures Procedures (including critical care time)  Medications Ordered in ED Medications - No data to display   Initial Impression / Assessment and Plan / ED Course  I have reviewed the triage vital signs and the nursing notes.  Pertinent labs & imaging results that were available during my care of the patient were reviewed by me and considered in my medical decision making (see chart for details).     Patient's renal CT negative.  His urinalysis is also negative.  Patient denies hip pain at this time and states that it is  been more flank pain.  Will discharge home patient has full range of motion at his hips.  No shortening or rotation noted. Final Clinical Impressions(s) / ED Diagnoses   Final diagnoses:  None    ED Discharge Orders    None       Lacretia Leigh, MD 08/25/17 1622    Lacretia Leigh, MD 08/25/17 [phone]

## 2017-08-25 NOTE — ED Triage Notes (Signed)
Per EMS, pt from home.  Pt c/o hip pain for 2 months.  No trauma.  Pain worsening over time.  A/O x 4.  Vitals: 170/70, hr 58 (normal for patient) 97% ra

## 2017-08-26 LAB — URINE CULTURE: Culture: NO GROWTH

## 2017-08-28 ENCOUNTER — Encounter: Payer: Medicare HMO | Attending: Physical Medicine & Rehabilitation | Admitting: Registered Nurse

## 2017-08-28 ENCOUNTER — Ambulatory Visit (INDEPENDENT_AMBULATORY_CARE_PROVIDER_SITE_OTHER): Payer: Medicare HMO | Admitting: Family Medicine

## 2017-08-28 ENCOUNTER — Encounter: Payer: Self-pay | Admitting: Family Medicine

## 2017-08-28 ENCOUNTER — Encounter: Payer: Self-pay | Admitting: Registered Nurse

## 2017-08-28 VITALS — BP 142/82 | HR 80 | Temp 98.1°F | Wt 180.0 lb

## 2017-08-28 VITALS — BP 148/70 | HR 56 | Ht 67.0 in | Wt 182.0 lb

## 2017-08-28 DIAGNOSIS — Z5181 Encounter for therapeutic drug level monitoring: Secondary | ICD-10-CM

## 2017-08-28 DIAGNOSIS — M25551 Pain in right hip: Secondary | ICD-10-CM | POA: Insufficient documentation

## 2017-08-28 DIAGNOSIS — Z79899 Other long term (current) drug therapy: Secondary | ICD-10-CM | POA: Diagnosis not present

## 2017-08-28 DIAGNOSIS — M25562 Pain in left knee: Secondary | ICD-10-CM | POA: Diagnosis not present

## 2017-08-28 DIAGNOSIS — M1712 Unilateral primary osteoarthritis, left knee: Secondary | ICD-10-CM | POA: Insufficient documentation

## 2017-08-28 DIAGNOSIS — G8929 Other chronic pain: Secondary | ICD-10-CM

## 2017-08-28 DIAGNOSIS — M47816 Spondylosis without myelopathy or radiculopathy, lumbar region: Secondary | ICD-10-CM | POA: Insufficient documentation

## 2017-08-28 DIAGNOSIS — G894 Chronic pain syndrome: Secondary | ICD-10-CM | POA: Insufficient documentation

## 2017-08-28 DIAGNOSIS — R109 Unspecified abdominal pain: Secondary | ICD-10-CM

## 2017-08-28 DIAGNOSIS — I739 Peripheral vascular disease, unspecified: Secondary | ICD-10-CM | POA: Insufficient documentation

## 2017-08-28 MED ORDER — PREDNISONE 50 MG PO TABS: ORAL_TABLET | ORAL | 0 refills | Status: DC

## 2017-08-28 MED ORDER — OXYCODONE-ACETAMINOPHEN 7.5-325 MG PO TABS: 1.0000 | ORAL_TABLET | Freq: Two times a day (BID) | ORAL | 0 refills | Status: DC | PRN

## 2017-08-28 NOTE — Progress Notes (Signed)
Subjective:    Patient ID: Kevin [name], male    DOB: 1941-03-27, 77 y.o.   MRN: [id number]  HPI: [name] is a 77 year old male who returns for follow up appointment and medication refill. He states his pain is located in his lower back and left knee . He rates his pain 4. His current exercise regime is walking and performing house hold chores.   Mr. Erber Morphine equivalent is 24.00 MME. He is also prescribed diazepam  by Dr. [name]. We have discussed the black box warning of using opioids and benzodiazepines.I highlighted the dangers of using these drugs together and discussed the adverse events including respiratory suppression, overdose, cognitive impairment and importance of  compliance with current regimen. He verbalizes understanding, we will continue to monitor and adjust as indicated.    Mr. Lonigro went to [facility] ED for flank pain, not reviewed.   Mr. Cothern last Oral Swab was Performed on 06/16/2017, it was consistent.    Pain Inventory Average Pain 5 Pain Right Now 4 My pain is aching  In the last 24 hours, has pain interfered with the following? General activity 6 Relation with others 1 Enjoyment of life 8 What TIME of day is your pain at its worst? daytime Sleep (in general) Fair  Pain is worse with: walking Pain improves with: rest Relief from Meds: 7  Mobility walk without assistance use a cane ability to climb steps?  yes do you drive?  yes  Function retired  Neuro/Psych tremor trouble walking spasms dizziness  Prior Studies x-rays  Physicians involved in your care Any changes since last visit?  no   Family History  Problem Relation Age of Onset  . Parkinsonism Father   . Dementia Father   . Cancer Father        Throat  . Colon cancer Mother        died in 51s  . Cancer Mother        Colon   Social History   Socioeconomic History  . Marital status: Married    Spouse name: Not on file  . Number of children: Not on file  .  Years of education: Not on file  . Highest education level: Not on file  Occupational History  . Occupation: retired    Comment: maintenance; electrician  Social Needs  . Financial resource strain: Not on file  . Food insecurity:    Worry: Not on file    Inability: Not on file  . Transportation needs:    Medical: Not on file    Non-medical: Not on file  Tobacco Use  . Smoking status: Former Smoker    Packs/day: 2.00    Years: 28.00    Pack years: 56.00    Types: Cigarettes    Last attempt to quit: 05/31/1987    Years since quitting: 30.2  . Smokeless tobacco: Never Used  . Tobacco comment: quit smoking "in my 34's"  Substance and Sexual Activity  . Alcohol use: Yes    Alcohol/week: 0.0 oz    Comment: rare, mikes hard lemonade  . Drug use: No    Types: Marijuana    Comment: hx of-4-5 yrs ago  . Sexual activity: Not Currently  Lifestyle  . Physical activity:    Days per week: Not on file    Minutes per session: Not on file  . Stress: Not on file  Relationships  . Social connections:    Talks on phone: Not  on file    Gets together: Not on file    Attends religious service: Not on file    Active member of club or organization: Not on file    Attends meetings of clubs or organizations: Not on file    Relationship status: Not on file  Other Topics Concern  . Not on file  Social History Narrative   Married 33 years in 2015. Lived together for 12 years. 2 boys from first wife. 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       Hobbies: yardwork, [place] barn, woodwork, Orthoptist   Past Surgical History:  Procedure Laterality Date  . ABDOMINAL AORTIC ANEURYSM REPAIR  2010  . CAROTID ENDARTERECTOMY Left 09/20/2004  . CATARACT EXTRACTION, BILATERAL     2016-2017  . COLONOSCOPY    . ENDARTERECTOMY Right 10/12/2015   Procedure: RIGHT CAROTID ENDARTERECTOMY  WITH PATCH ANGIOPLASTY;  Surgeon: Elam Dutch, MD;  Location: [facility];  Service: Vascular;  Laterality: Right;  . INGUINAL HERNIA REPAIR Left 02/05/2013   Procedure: HERNIA REPAIR INGUINAL ADULT;  Surgeon: Joyice Faster. Cornett, MD;  Location: [facility];  Service: General;  Laterality: Left;  . INGUINAL HERNIA REPAIR Left   . INSERTION OF MESH Left 02/05/2013   Procedure: INSERTION OF MESH;  Surgeon: Joyice Faster. Cornett, MD;  Location: [facility];  Service: General;  Laterality: Left;  . SHOULDER ARTHROSCOPY WITH ROTATOR CUFF REPAIR AND SUBACROMIAL DECOMPRESSION Left 05/01/2014   Procedure: LEFT ARTHROSCOPY SHOULDER SUBACROMIAL DECOMPRESSION,DISTAL CLAVICAL RESECTION AND ROTATOR CUFF REPAIR;  Surgeon: Marin Shutter, MD;  Location: [facility];  Service: Orthopedics;  Laterality: Left;  . TESTICLAR CYST EXCISION Left   . TONSILLECTOMY    . VASECTOMY     Past Medical History:  Diagnosis Date  . Arthritis    DDD- Lower Back  . Bradycardia   . CAP (community acquired pneumonia) 03/17/2015   "THAT'S WHAT THEY ARE THINKING; THEY ARE NOT SURE"  . Carotid artery occlusion   . Chronic lower back pain    DDD  . CKD (chronic kidney disease)    Dr. Corliss Parish  . Diverticulosis   . Enlarged prostate    takes Flomax daily  . History of blood transfusion    "probably when they did aortic aneurysm"  . History of colon polyps    benign  . Hyperlipidemia   . Hypertension    takes Amlodipine and Zebeta daily  . Insomnia    takes Melatonin nightly  . Joint pain   . 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     . Muscle spasm    takes Zanaflex daily as needed  . Peripheral edema    takes Furosemide daily  . Pneumonia    hx of   . Rheumatic fever 1946  . Rheumatoid arthritis ([place])   . Short-term memory loss    minimal  . Shortness of breath dyspnea    with exertion but lasts very short period of time  . TIA (transient ischemic attack)    x  2;takes Plavix daily  . Urinary frequency    takes Flomax daily  . Urinary urgency    There were no vitals taken for this visit.  Opioid Risk Score:   Fall Risk Score:  `1  Depression screen [facility] 2/9  Depression screen [facility] 2/9 07/03/2017 09/08/2016 10/20/2015 07/02/2015 03/26/2015 09/22/2014 06/24/2013  Decreased Interest 0 0 0 0 0 0 1  Down, Depressed, Hopeless 0 0 0 0 0 0 0  PHQ - 2 Score 0 0 0 0 0 0 1  Some recent data might be hidden     Review of Systems  Constitutional: Negative.   HENT: Negative.   Eyes: Negative.   Respiratory: Negative.   Cardiovascular: Negative.   Gastrointestinal: Negative.   Endocrine: Negative.   Genitourinary: Positive for difficulty urinating.  Musculoskeletal: Positive for back pain.  Skin: Negative.   Allergic/Immunologic: Negative.   Neurological: Positive for dizziness and tremors.  Hematological: Negative.   Psychiatric/Behavioral: Negative.   All other systems reviewed and are negative.      Objective:   Physical Exam  Constitutional: He is oriented to person, place, and time. He appears well-developed and well-nourished.  HENT:  Head: Normocephalic and atraumatic.  Neck: Normal range of motion. Neck supple.  Cardiovascular: Normal rate and regular rhythm.  Pulmonary/Chest: Effort normal and breath sounds normal.  Musculoskeletal:  Normal Muscle Bulk and Muscle Testing Reveals: Upper Extremities: Full ROM and Muscle Strength 5/5 Lumbar Paraspinal Tenderness: L-3-L-5 Lower Extremities: Full ROM and Muscle Strength 5/5 Arises from chair slowly using cane for support Antalgic Gait   Neurological: He is alert and oriented to person, place, and time.  Skin: Skin is warm and dry. No erythema.  Psychiatric: He has a normal mood and affect.  Nursing note and vitals reviewed.         Assessment & Plan:  1. Lumbar Spondylosis/ Lumbar Stenosis: Refilled: zwe will continue : Oxycodone 7.5/325 mg one tablet twice daily as needed for  severe pain #60. 08/28/2017 We will continue the opioid monitoring program, this consists of regular clinic visits, examinations, urine drug screen, pill counts as well as use of [place] Controlled Substance Reporting System.  2. Chronic Left Knee Pain: Orthopedist Following Dr. Paulla Fore. 07/31/2017   15 minutes of face to face patient care time was spent during this visit. All questions were encouraged and answered.

## 2017-08-28 NOTE — Progress Notes (Signed)
    Subjective:  Kevin [name] is a 77 y.o. male who presents today with a chief complaint of left flank pain.  HPI:  Left Flank Pain, Acute Issue Started a a couple of weeks ago. Symptoms have worsened over the last several days.  Patient went to the ED about 3 days ago for his symptoms.  At that time had a noncontrast CT scan which was negative without any signs of nephrolithiasis.  Since then symptoms have been the same.  No fever or chills.  No nausea or vomiting.  No constipation or diarrhea.  No obvious precipitating events.  Pain described as a "clamp-like" pain that radiates from his left lower back into his left lower abdomen.  Patient wakes up with the pain.  Pain is constant in nature.  No recent medication changes.  A little bit worse with movement and lying down.  ROS: Per HPI  PMH: He reports that he quit smoking about 30 years ago. His smoking use included cigarettes. He has a 56.00 pack-year smoking history. He has never used smokeless tobacco. He reports that he drinks alcohol. He reports that he does not use drugs.  Objective:  Physical Exam: BP (!) 142/82 (BP Location: Left Arm)   Pulse 80   Temp 98.1 F (36.7 C)   Wt 180 lb (81.6 kg)   SpO2 98%   BMI 28.19 kg/m   Gen: NAD, resting comfortably CV: RRR with no murmurs appreciated Pulm: NWOB, CTAB with no crackles, wheezes, or rhonchi GI: Normal bowel sounds present. Soft, Nontender, Nondistended. MSK: -Back: Reversal of lumbar lordosis noted.  Tender to palpation along lower thoracic and lumbar paraspinal muscles. -Lower extremities: Strength 5 out of 5 throughout.  Sensation light touch intact throughout.  Assessment/Plan:  Left flank pain Reassuring that patient had normal noncontrast CT and urinalysis in the emergency room.  Patient has a significant amount of degenerative changes and scoliosis in his lumbar spine based on his most recent lumbar MRI.  Differential at this point includes lower thoracic radicular  pain or possible muscular strain.  He does not have any red flag signs or symptoms.  Abdominal exam is otherwise benign.  We will start prednisone burst to help with both the above possible etiologies.  If no improvement, would consider dedicated thoracic and lumbar imaging to further characterize degenerative changes and scoliosis.  Return precautions reviewed.  Algis Greenhouse. Jerline Pain, MD 08/28/2017 3:00 PM

## 2017-08-28 NOTE — Patient Instructions (Signed)
Start the prednisone.  Let me know if your symptoms are worsening or not improving over the next 5-7 days.  Take care,  Dr Jerline Pain

## 2017-08-29 ENCOUNTER — Encounter: Payer: Self-pay | Admitting: Sports Medicine

## 2017-08-29 ENCOUNTER — Ambulatory Visit: Payer: Self-pay

## 2017-08-29 ENCOUNTER — Ambulatory Visit (INDEPENDENT_AMBULATORY_CARE_PROVIDER_SITE_OTHER): Payer: Medicare HMO | Admitting: Sports Medicine

## 2017-08-29 VITALS — BP 130/72 | HR 47 | Ht 67.0 in | Wt 182.6 lb

## 2017-08-29 DIAGNOSIS — M1712 Unilateral primary osteoarthritis, left knee: Secondary | ICD-10-CM

## 2017-08-29 DIAGNOSIS — M25561 Pain in right knee: Secondary | ICD-10-CM

## 2017-08-29 DIAGNOSIS — G894 Chronic pain syndrome: Secondary | ICD-10-CM | POA: Diagnosis not present

## 2017-08-29 DIAGNOSIS — M25552 Pain in left hip: Secondary | ICD-10-CM | POA: Diagnosis not present

## 2017-08-29 DIAGNOSIS — M25562 Pain in left knee: Secondary | ICD-10-CM | POA: Diagnosis not present

## 2017-08-29 DIAGNOSIS — I739 Peripheral vascular disease, unspecified: Secondary | ICD-10-CM | POA: Diagnosis not present

## 2017-08-29 DIAGNOSIS — M47816 Spondylosis without myelopathy or radiculopathy, lumbar region: Secondary | ICD-10-CM

## 2017-08-29 NOTE — Progress Notes (Signed)
[name]. [place], [place] at [facility] [phone]  DELMON ANDRADA - 77 y.o. male MRN [id number]  Date of birth: 1940/11/14  Visit Date: 08/29/2017  PCP: Marin Olp, MD   Referred by: Marin Olp, MD  Scribe for today's visit: Wendy Poet, LAT, ATC     SUBJECTIVE:  KENYEN CANDY is here for Follow-up (R hip and knee pain) .   Notes from office visit on 03/17/17: Mr. Hersh is an established pt presenting today for f/u of his B knee pain.  He was last seen on 11/02/16 and had an injection in his L knee.  He was also prescribed a Body Helix knee sleeve.  He states that he feels like the injection did help w/ his L knee for a while but can't recall for how long it helped.  He states that he's experiencing aching L knee pain that he rates as a 3/10.  He also reports that he's been wearing the Body Helix sleeve fairly regularly and feels like it helps.    Pt states that he is most interested in discussing options for switching his care from pain management to [facility] for his chronic back pain.  Pt states that Dr. [name] had referred pt to pain management.  He states that's been OK but he would like to stay w/i the [facility] system and would like to discuss some other options.  Pt states that he has chronic LBP x approximately 4 years that radiates into his B hips.  He notes that he can no longer sleep normally due to the back and hip pain and now has to sleep in a recliner.   Compared to the last office visit on 03/17/17, his previously described knee pain and LBP symptoms about the same.  L knee currently doesn't hurt but is popping and snapping a lot.  Currently has pain in his R leg from this hip to knee.  States that he saw the PM&R doctor this morning and had a good visit. Current symptoms are mild in the L knee and moderate in the R LE & are radiating to the R thigh. He has been using the Gabapentin.  08/29/17: Compared to the  last office visit on 06/16/17, his previously described R hip and knee symptoms have improved but now his L hip and knee are bothering him.  The L hip and knee symptoms have been increasingly bothering him over the past few weeks w/ no MOI.  Went to ED on 08/25/17 for L flank pain.  Also saw pain management on 08/28/17 and saw [employer].  He will be seeing them once a month. Current symptoms are aching 4/10 pain in the L hip and L knee pain is an 8-9/10 at it's worst & are non-radiating.  States that the L knee has almost given out on him a few times over the past couple weeks. He has been taking his Gabapentin and was put on prednisone yesterday by Dr. Jerline Pain.  ROS Denies night time disturbances. Denies fevers, chills, or night sweats. Denies unexplained weight loss. Denies personal history of cancer. Denies changes in bowel or bladder habits. Denies recent unreported falls. Denies new or worsening dyspnea or wheezing. Reports headaches or dizziness.  Denies numbness, tingling or weakness  In the extremities.  Denies dizziness or presyncopal episodes Denies lower extremity edema    HISTORY & PERTINENT PRIOR DATA:  Prior History reviewed and updated per electronic medical  record.  Significant/pertinent history, findings, studies include:  reports that he quit smoking about 30 years ago. His smoking use included cigarettes. He has a 56.00 pack-year smoking history. He has never used smokeless tobacco. No results for input(s): HGBA1C, LABURIC, CREATINE in the last 8760 hours. 09/11/2017 - Zilretta PA approved valid 09/05/17-10/05/17. Plan has no deductible, patient responsibility of 20%. - BSC No problems updated.  OBJECTIVE:  VS:  HT:5\' 7"  (170.2 cm)   WT:182 lb 9.6 oz (82.8 kg)  BMI:28.59    BP:130/72  HR:(Abnormal) 47bpm  TEMP: ( )  RESP:97 %   PHYSICAL EXAM: Constitutional: WDWN, Non-toxic appearing. Psychiatric: Alert & appropriately interactive.  Not depressed or anxious  appearing. Respiratory: No increased work of breathing.  Trachea Midline Eyes: Pupils are equal.  EOM intact without nystagmus.  No scleral icterus  Vascular Exam: warm to touch no edema Diminished pulses bilateral lower extremities  lower extremity neuro exam: unremarkable  MSK Exam: Left knee: generalized osteoarthritic bossing.  5 degree flexion contracture.  He has a small amount of generalized synovitis bilaterally with a trace effusion on the left.  He has mild pain over the greater trochanter on the left but a negative straight leg raise.  Walks with antalgic gait.   ASSESSMENT & PLAN:   1. Primary osteoarthritis of left knee   2. Left knee pain, unspecified chronicity   3. Lumbar spondylosis   4. PAD (peripheral artery disease) ([place])   5. Chronic pain syndrome     PLAN: Repeat injection today.  We will have him call to schedule follow-up.  Can consider viscous augmentation and/or Zilretta in the future.  If any persistent ongoing symptoms could consider further evaluation of his PAD given the ongoing leg pain pulses were ultimately palpable today.  Follow-up: Return if symptoms worsen or fail to improve, for as needed for ongoing issues.      Please see additional documentation for Objective, Assessment and Plan sections. Pertinent additional documentation may be included in corresponding procedure notes, imaging studies, problem based documentation and patient instructions. Please see these sections of the encounter for additional information regarding this visit.  CMA/ATC served as Education administrator during this visit. History, Physical, and Plan performed by medical provider. Documentation and orders reviewed and attested to.      Gerda Diss, [place] [facility] Physician

## 2017-08-29 NOTE — Procedures (Signed)
PROCEDURE NOTE:  Ultrasound Guided: Injection: Left knee Images were obtained and interpreted by myself, Teresa Coombs, DO  Images have been saved and stored to PACS system. Images obtained on: GE S7 Ultrasound machine    ULTRASOUND FINDINGS:  small effusion. moderate synovitis  DESCRIPTION OF PROCEDURE:  The patient's clinical condition is marked by substantial pain and/or significant functional disability. Other conservative therapy has not provided relief, is contraindicated, or not appropriate. There is a reasonable likelihood that injection will significantly improve the patient's pain and/or functional impairment.   After discussing the risks, benefits and expected outcomes of the injection and all questions were reviewed and answered, the patient wished to undergo the above named procedure.  Verbal consent was obtained.  The ultrasound was used to identify the target structure and adjacent neurovascular structures. The skin was then prepped in sterile fashion and the target structure was injected under direct visualization using sterile technique as below:  PREP: Alcohol and Ethel Chloride APPROACH: direct, single injection, 25g 1.5 in. INJECTATE: 2 cc 0.5% Marcaine and 2 cc 40mg /mL DepoMedrol ASPIRATE: None DRESSING: Band-Aid  Post procedural instructions including recommending icing and warning signs for infection were reviewed.    This procedure was well tolerated and there were no complications.   IMPRESSION: Succesful Ultrasound Guided: Injection

## 2017-08-29 NOTE — Patient Instructions (Signed)

## 2017-09-04 ENCOUNTER — Telehealth: Payer: Self-pay | Admitting: *Deleted

## 2017-09-04 ENCOUNTER — Ambulatory Visit: Payer: Medicare HMO | Admitting: Family Medicine

## 2017-09-04 NOTE — Telephone Encounter (Signed)
Noted! Thank you

## 2017-09-04 NOTE — Telephone Encounter (Signed)
Patient scheduled on office visit for Wednesday for abdominal pain. I called patient to collect more information. He states that he is still experiencing left flank pain. He states he finished the prednisone and it did seem to help however he is still getting sharp agonizing pains and when he bends over it is very difficult to stand back up. Denies and urinary signs or symptoms. I reschedule patient for an appointment with Dr [name] tomorrow.

## 2017-09-05 ENCOUNTER — Ambulatory Visit (INDEPENDENT_AMBULATORY_CARE_PROVIDER_SITE_OTHER): Payer: Medicare HMO

## 2017-09-05 ENCOUNTER — Ambulatory Visit (INDEPENDENT_AMBULATORY_CARE_PROVIDER_SITE_OTHER): Payer: Medicare HMO | Admitting: Family Medicine

## 2017-09-05 ENCOUNTER — Encounter: Payer: Self-pay | Admitting: Family Medicine

## 2017-09-05 VITALS — BP 136/82 | HR 61 | Temp 99.0°F | Ht 67.0 in | Wt 181.0 lb

## 2017-09-05 DIAGNOSIS — R82998 Other abnormal findings in urine: Secondary | ICD-10-CM | POA: Diagnosis not present

## 2017-09-05 DIAGNOSIS — M160 Bilateral primary osteoarthritis of hip: Secondary | ICD-10-CM | POA: Diagnosis not present

## 2017-09-05 DIAGNOSIS — M25552 Pain in left hip: Secondary | ICD-10-CM

## 2017-09-05 DIAGNOSIS — R319 Hematuria, unspecified: Secondary | ICD-10-CM | POA: Diagnosis not present

## 2017-09-05 DIAGNOSIS — R109 Unspecified abdominal pain: Secondary | ICD-10-CM

## 2017-09-05 LAB — POC URINALSYSI DIPSTICK (AUTOMATED)
Bilirubin, UA: NEGATIVE
Glucose, UA: NEGATIVE
KETONES UA: NEGATIVE
Nitrite, UA: POSITIVE
PROTEIN UA: NEGATIVE
SPEC GRAV UA: 1.02 (ref 1.010–1.025)
UROBILINOGEN UA: 0.2 U/dL
pH, UA: 6 (ref 5.0–8.0)

## 2017-09-05 NOTE — Progress Notes (Signed)
Subjective:  Kevin [name] is a 77 y.o. year old very pleasant male patient who presents for/with See problem oriented charting ROS-no fever, chills, nausea, vomiting.  Does have left groin pain.  Does have lower and upper abdominal pain on the left side  Past Medical History-  Patient Active Problem List   Diagnosis Date Noted  . PAD (peripheral artery disease) ([place]) 01/08/2016    Priority: High  . Diastolic CHF ([place]) 40/98/1191    Priority: High  . CKD (chronic kidney disease), stage III ([place]) 01/27/2014    Priority: High  . AAA (abdominal aortic aneurysm, ruptured) ([place]) 01/17/2013    Priority: High  . Carotid artery stenosis s/p L carotid endarterectomy 01/12/2012    Priority: High  . History of CVA (cerebrovascular accident) 11/17/2011    Priority: High  . Chronic pain syndrome 12/23/2009    Priority: High  . Personal history of colonic polyps 05/15/2013    Priority: Medium  . Essential tremor 10/05/2009    Priority: Medium  . UNSPECIFIED ANEMIA 12/05/2008    Priority: Medium  . BPH (benign prostatic hyperplasia) 06/04/2007    Priority: Medium  . Fasting hyperglycemia 02/05/2007    Priority: Medium  . Hyperlipidemia 01/03/2007    Priority: Medium  . Essential hypertension 01/03/2007    Priority: Medium  . CAP (community acquired pneumonia) 03/17/2015    Priority: Low  . Family history of malignant neoplasm of gastrointestinal tract 05/15/2013    Priority: Low  . Inguinal hernia 05/01/2012    Priority: Low  . BURSITIS, HIP 12/21/2009    Priority: Low  . ACTINIC KERATOSIS, EAR, LEFT 03/09/2009    Priority: Low  . Primary osteoarthritis of left knee 11/02/2007    Priority: Low  . 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 Lumbar spondylosis 03/17/2017    Medications- reviewed and updated Current Outpatient Medications  Medication Sig Dispense Refill  . albuterol (VENTOLIN HFA) 108 (90 Base) MCG/ACT inhaler Inhale 2 puffs into the lungs  every 6 (six) hours as needed for wheezing or shortness of breath.    Marland Kitchen amLODipine (NORVASC) 10 MG tablet Take 1 tablet (10 mg total) by mouth daily. 90 tablet 1  . atorvastatin (LIPITOR) 20 MG tablet Take 1 tablet (20 mg total) by mouth daily. 90 tablet 1  . BIOTIN PO Take 1 tablet by mouth daily.    . Cholecalciferol (VITAMIN D-3) 1000 UNITS CAPS Take 1 capsule by mouth daily.    . clopidogrel (PLAVIX) 75 MG tablet Take 1 tablet (75 mg total) by mouth daily. 90 tablet 1  . diazepam (VALIUM) 5 MG tablet Take 0.5-1 tablets (2.5-5 mg total) by mouth every 8 (eight) hours as needed for muscle spasms or sedation. 90 tablet 1  . fenofibrate 160 MG tablet Take 1 tablet (160 mg total) by mouth daily. 90 tablet 3  . furosemide (LASIX) 20 MG tablet Take 1 tablet (20 mg total) by mouth every other day. 90 tablet 1  . gabapentin (NEURONTIN) 300 MG capsule Take 1 capsule (300 mg total) by mouth 2 (two) times daily. 180 capsule 1  . Melatonin 3 MG TABS Take 5 mg by mouth at bedtime.     Marland Kitchen oxyCODONE-acetaminophen (PERCOCET) 7.5-325 MG tablet Take 1 tablet by mouth 2 (two) times daily as needed for severe pain. 60 tablet 0  . propranolol (INDERAL) 40 MG tablet Take 1 tablet (40 mg total) by mouth 2 (two) times daily. 180 tablet 3  .  tamsulosin (FLOMAX) 0.4 MG CAPS capsule Take 1 capsule (0.4 mg total) by mouth daily. (Patient taking differently: Take 0.4 mg by mouth every evening. ) 90 capsule 3  . tiZANidine (ZANAFLEX) 4 MG tablet TAKE 1 TABLET BY MOUTH  EVERY 6 HOURS AS NEEDED FOR MUSCLE SPASMS 90 tablet 1  . triamcinolone cream (KENALOG) 0.1 % APPLY 1 APPLICATION 2 TIMES DAILY TOPICALLY FOR 7-10 DAYS 80 g 0  . vitamin B-12 (CYANOCOBALAMIN) 1000 MCG tablet Take 1,000 mcg by mouth daily.     No current facility-administered medications for this visit.     Objective: BP 136/82 (BP Location: Left Arm, Patient Position: Sitting, Cuff Size: Normal)   Pulse 61   Temp 99 F (37.2 C) (Oral)   Ht 5\' 7"  (1.702  m)   Wt 181 lb (82.1 kg)   SpO2 97%   BMI 28.35 kg/m  Gen: NAD, resting comfortably CV: RRR no murmurs rubs or gallops Lungs: CTAB no crackles, wheeze, rhonchi Abdomen: soft/no obvious hernia in groin-also minimal pain with palpation in the groin.  Does have left lower quadrant and left upper quadrant pain with palpation moderate in intensity/nondistended/normal bowel sounds. No rebound or guarding.  Ext: no edema Skin: warm, dry Reasonable range of motion of the hip for age.  With [employer] test he does grasp at his left groin though  Assessment/Plan:   Left hip pain - Plan: DG HIP UNILAT WITH PELVIS 2-3 VIEWS LEFT Left sided abdominal pain - Plan: CBC with Differential/Platelet, Comprehensive metabolic panel, Lipase, POCT Urinalysis Dipstick (Automated) S: mid march started with right flank pain then went away within a few days, then started with pain in his left hip/groin- hurts up into lower abdomen. He states his pain can be severePercocet does not help him at all.  Pain can even go into upper abdomen on left side.  No nausea or vomiting. stooling most days. Bowel movements- small balls- states not firm.    He was seen in the emergency room where he had a noncontrast CT which was negative for kidney stones.  His urinalysis and urine culture were reassuring but he was not having any urinary symptoms at that time. Mild burning with urination today- just started.  He was noted to have good range of motion of his hips.  Patient saw Dr. Jerline Pain here in the office a few days later.  Degenerative changes in the lumbar spine were noted from most recent MRI-which led to concern of either thoracic radicular pain or possible muscular strain.  Abdominal exam was benign at that time.  He was started on a course of prednisone for potential thoracic radicular pain or muscular strain.  Notes indicate consideration of dedicated thoracic and lumbar imaging if needed in the future.  Today patient updates me  that the prednisone did not help him.  He has continued significant pain starting in left groin but radiating up into the lower left quadrant and even upper left quadrant.  He does report a history of arthritis in his right hip requiring injection in the past.  As noted above also has some new mild dysuria A/P: From AVS "Looking at your CT scan there is a fair amount of stool/poop on the left side of your colon (also fair amount on the right). I think we need to clear this out more. I want you to use a capful of miralax daily for the next 7-10 days then see me back to see if you are improving.   I  also want to check a urine to make sure no infection there with your slight burning today though I doubt  Also with the groin pain I want to check you for hip arthritis with x-ray. We can do this before you leave today.   We could do a CT scan again with contrast - but that could hurt your kidneys and I think the other trials are worthwhile first"  High risk for constipation with narcotic use long term. Using stool softener but will add miralax as above.   Urinalysis came back with leukocytes and blood.  We will get urine microscopic as well as urine culture.  If true hematuria may need urological consult.  I did the hip films due to a positive stinchfield test- patient grasps at groin with this maneuver though doubt this would explain abdominal pain.   Patient specifically states he does not want to increase his pain medicine but wants to get to the bottom of what is causing his pain.  He has been faithful to his pain contract with physical medicine and rehabilitation.  Future Appointments  Date Time Provider [place]  09/27/2017  8:20 AM Bayard Hugger, NP [facility] CPR  11/10/2017  8:45 AM Tat, Eustace Quail, DO [facility] None   Lab/Order associations: Left hip pain - Plan: DG HIP UNILAT WITH PELVIS 2-3 VIEWS LEFT  Left sided abdominal pain - Plan: CBC with Differential/Platelet, Comprehensive  metabolic panel, Lipase, POCT Urinalysis Dipstick (Automated)  Leukocytes in urine - Plan: Urine Culture  Hematuria, unspecified type - Plan: Urine Microscopic  Return precautions advised.  Garret Reddish, MD

## 2017-09-05 NOTE — Patient Instructions (Addendum)
Looking at your CT scan there is a fair amount of stool/poop on the left side of your colon (also fair amount on the right). I think we need to clear this out more. I want you to use a capful of miralax daily for the next 7-10 days then see me back to see if you are improving.   I also want to check a urine to make sure no infection there with your slight burning today though I doubt  Also with the groin pain I want to check you for hip arthritis with x-ray. We can do this before you leave today.   We could do a CT scan again with contrast - but that could hurt your kidneys and I think the other trials are worthwhile first

## 2017-09-06 ENCOUNTER — Ambulatory Visit: Payer: Medicare HMO | Admitting: Family Medicine

## 2017-09-06 LAB — COMPREHENSIVE METABOLIC PANEL
ALBUMIN: 4 g/dL (ref 3.5–5.2)
ALK PHOS: 29 U/L — AB (ref 39–117)
ALT: 16 U/L (ref 0–53)
AST: 16 U/L (ref 0–37)
BILIRUBIN TOTAL: 0.7 mg/dL (ref 0.2–1.2)
BUN: 27 mg/dL — ABNORMAL HIGH (ref 6–23)
CALCIUM: 8.9 mg/dL (ref 8.4–10.5)
CO2: 29 mEq/L (ref 19–32)
Chloride: 100 mEq/L (ref 96–112)
Creatinine, Ser: 1.97 mg/dL — ABNORMAL HIGH (ref 0.40–1.50)
GFR: 35.06 mL/min — AB (ref >60.00)
Glucose, Bld: 109 mg/dL — ABNORMAL HIGH (ref 70–99)
POTASSIUM: 4.2 meq/L (ref 3.5–5.1)
Sodium: 136 mEq/L (ref 135–145)
TOTAL PROTEIN: 6.7 g/dL (ref 6.0–8.3)

## 2017-09-06 LAB — CBC WITH DIFFERENTIAL/PLATELET
Basophils Absolute: 0.1 10*3/uL (ref 0.0–0.1)
Basophils Relative: 0.9 % (ref 0.0–3.0)
EOS PCT: 5.8 % — AB (ref 0.0–5.0)
Eosinophils Absolute: 0.8 10*3/uL — ABNORMAL HIGH (ref 0.0–0.7)
HCT: 34.5 % — ABNORMAL LOW (ref 39.0–52.0)
HEMOGLOBIN: 11.8 g/dL — AB (ref 13.0–17.0)
LYMPHS ABS: 2.2 10*3/uL (ref 0.7–4.0)
Lymphocytes Relative: 17.1 % (ref 12.0–46.0)
MCHC: 34.3 g/dL (ref 30.0–36.0)
MCV: 90.7 fl (ref 78.0–100.0)
Monocytes Absolute: 1 10*3/uL (ref 0.1–1.0)
Monocytes Relative: 7.9 % (ref 3.0–12.0)
NEUTROS PCT: 68.3 % (ref 43.0–77.0)
Neutro Abs: 8.8 10*3/uL — ABNORMAL HIGH (ref 1.4–7.7)
Platelets: 218 10*3/uL (ref 150.0–400.0)
RBC: 3.8 Mil/uL — AB (ref 4.22–5.81)
RDW: 14.8 % (ref 11.5–15.5)
WBC: 12.9 10*3/uL — ABNORMAL HIGH (ref 4.0–10.5)

## 2017-09-06 LAB — LIPASE: LIPASE: 71 U/L — AB (ref 11.0–59.0)

## 2017-09-06 LAB — URINALYSIS, MICROSCOPIC ONLY: RBC / HPF: NONE SEEN (ref 0–?)

## 2017-09-07 ENCOUNTER — Telehealth: Payer: Self-pay

## 2017-09-07 ENCOUNTER — Other Ambulatory Visit: Payer: Self-pay

## 2017-09-07 DIAGNOSIS — N390 Urinary tract infection, site not specified: Secondary | ICD-10-CM

## 2017-09-07 LAB — URINE CULTURE
MICRO NUMBER:: 90436357
SPECIMEN QUALITY: ADEQUATE

## 2017-09-07 MED ORDER — CEPHALEXIN 500 MG PO CAPS: 500.0000 mg | ORAL_CAPSULE | Freq: Two times a day (BID) | ORAL | 0 refills | Status: AC

## 2017-09-07 NOTE — Telephone Encounter (Signed)
Called patient and left a message to call office back.  

## 2017-09-08 ENCOUNTER — Telehealth: Payer: Self-pay

## 2017-09-08 NOTE — Telephone Encounter (Signed)
Called patient and he has started taking the Keflex and I informed him that based on his urine culture he is on the right antibiotic.

## 2017-09-19 ENCOUNTER — Encounter: Payer: Self-pay | Admitting: Family Medicine

## 2017-09-19 ENCOUNTER — Ambulatory Visit (INDEPENDENT_AMBULATORY_CARE_PROVIDER_SITE_OTHER): Payer: Medicare HMO | Admitting: Family Medicine

## 2017-09-19 VITALS — BP 106/78 | HR 51 | Ht 67.0 in | Wt 178.8 lb

## 2017-09-19 DIAGNOSIS — N183 Chronic kidney disease, stage 3 unspecified: Secondary | ICD-10-CM

## 2017-09-19 DIAGNOSIS — K5903 Drug induced constipation: Secondary | ICD-10-CM

## 2017-09-19 DIAGNOSIS — I10 Essential (primary) hypertension: Secondary | ICD-10-CM | POA: Diagnosis not present

## 2017-09-19 DIAGNOSIS — I5032 Chronic diastolic (congestive) heart failure: Secondary | ICD-10-CM

## 2017-09-19 DIAGNOSIS — K5900 Constipation, unspecified: Secondary | ICD-10-CM | POA: Insufficient documentation

## 2017-09-19 NOTE — Assessment & Plan Note (Signed)
Once again getting a CT scan with contrast is not ideal given his CKD stage III with GFR in the 30s. he follows with nephrology.  GFR and creatinine were stable earlier this month

## 2017-09-19 NOTE — Assessment & Plan Note (Addendum)
Blood pressure is well controlled on  propranolol 40mg  BID+ amlodipine 10mg  +Lasix 20 mg every other day . could consider reducing propranolol if HR stays in low 50s

## 2017-09-19 NOTE — Assessment & Plan Note (Signed)
S: left lower quadrant pain has improved with using miralax most days- more frequent bowel movements now though not everyday. States pain in left lower abdomen about 50% better but can be worse at times. Had already bene on stool softener. Finished antibiotic yesterday for UTI. Left hip doing better today.  A/P: Given patient is improving we will continue current treatment.  I wish he had complete resolution but other options for evaluation include CT scan with contrast and given GFR in the 30s I do not think this is ideal.  We will monitor only for now.

## 2017-09-19 NOTE — Progress Notes (Signed)
Subjective:  Kevin [name] is a 77 y.o. year old very pleasant male patient who presents for/with See problem oriented charting ROS-mild left lower quadrant pain which continues to improve.  No nausea or vomiting.  No edema.  No fever chills.  Past Medical History-  Patient Active Problem List   Diagnosis Date Noted  . PAD (peripheral artery disease) ([place]) 01/08/2016    Priority: High  . Diastolic CHF ([place]) 03/26/2535    Priority: High  . CKD (chronic kidney disease), stage III ([place]) 01/27/2014    Priority: High  . AAA (abdominal aortic aneurysm, ruptured) ([place]) 01/17/2013    Priority: High  . Carotid artery stenosis s/p L carotid endarterectomy 01/12/2012    Priority: High  . History of CVA (cerebrovascular accident) 11/17/2011    Priority: High  . Chronic pain syndrome 12/23/2009    Priority: High  . Constipation 09/19/2017    Priority: Medium  . Personal history of colonic polyps 05/15/2013    Priority: Medium  . Essential tremor 10/05/2009    Priority: Medium  . UNSPECIFIED ANEMIA 12/05/2008    Priority: Medium  . BPH (benign prostatic hyperplasia) 06/04/2007    Priority: Medium  . Fasting hyperglycemia 02/05/2007    Priority: Medium  . Hyperlipidemia 01/03/2007    Priority: Medium  . Essential hypertension 01/03/2007    Priority: Medium  . CAP (community acquired pneumonia) 03/17/2015    Priority: Low  . Family history of malignant neoplasm of gastrointestinal tract 05/15/2013    Priority: Low  . Inguinal hernia 05/01/2012    Priority: Low  . BURSITIS, HIP 12/21/2009    Priority: Low  . ACTINIC KERATOSIS, EAR, LEFT 03/09/2009    Priority: Low  . Primary osteoarthritis of left knee 11/02/2007    Priority: Low  . 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 Lumbar spondylosis 03/17/2017    Medications- reviewed and updated Current Outpatient Medications  Medication Sig Dispense Refill  . albuterol (VENTOLIN HFA) 108 (90 Base) MCG/ACT  inhaler Inhale 2 puffs into the lungs every 6 (six) hours as needed for wheezing or shortness of breath.    Marland Kitchen amLODipine (NORVASC) 10 MG tablet Take 1 tablet (10 mg total) by mouth daily. 90 tablet 1  . atorvastatin (LIPITOR) 20 MG tablet Take 1 tablet (20 mg total) by mouth daily. 90 tablet 1  . BIOTIN PO Take 1 tablet by mouth daily.    . Cholecalciferol (VITAMIN D-3) 1000 UNITS CAPS Take 1 capsule by mouth daily.    . clopidogrel (PLAVIX) 75 MG tablet Take 1 tablet (75 mg total) by mouth daily. 90 tablet 1  . diazepam (VALIUM) 5 MG tablet Take 0.5-1 tablets (2.5-5 mg total) by mouth every 8 (eight) hours as needed for muscle spasms or sedation. 90 tablet 1  . fenofibrate 160 MG tablet Take 1 tablet (160 mg total) by mouth daily. 90 tablet 3  . furosemide (LASIX) 20 MG tablet Take 1 tablet (20 mg total) by mouth every other day. 90 tablet 1  . gabapentin (NEURONTIN) 300 MG capsule Take 1 capsule (300 mg total) by mouth 2 (two) times daily. 180 capsule 1  . Melatonin 3 MG TABS Take 5 mg by mouth at bedtime.     Marland Kitchen oxyCODONE-acetaminophen (PERCOCET) 7.5-325 MG tablet Take 1 tablet by mouth 2 (two) times daily as needed for severe pain. 60 tablet 0  . propranolol (INDERAL) 40 MG tablet Take 1 tablet (40 mg total) by mouth 2 (two)  times daily. 180 tablet 3  . tamsulosin (FLOMAX) 0.4 MG CAPS capsule Take 1 capsule (0.4 mg total) by mouth daily. (Patient taking differently: Take 0.4 mg by mouth every evening. ) 90 capsule 3  . tiZANidine (ZANAFLEX) 4 MG tablet TAKE 1 TABLET BY MOUTH  EVERY 6 HOURS AS NEEDED FOR MUSCLE SPASMS 90 tablet 1  . triamcinolone cream (KENALOG) 0.1 % APPLY 1 APPLICATION 2 TIMES DAILY TOPICALLY FOR 7-10 DAYS 80 g 0  . vitamin B-12 (CYANOCOBALAMIN) 1000 MCG tablet Take 1,000 mcg by mouth daily.     Objective: BP 106/78 (BP Location: Left Arm, Patient Position: Sitting, Cuff Size: Normal)   Pulse (!) 51   Ht 5\' 7"  (1.702 m)   Wt 178 lb 12.8 oz (81.1 kg)   SpO2 98%   BMI 28.00  kg/m  Gen: NAD, resting comfortably CV: RRR no murmurs rubs or gallops Lungs:slightly bradycardic but regular, no crackles, wheeze, rhonchi Abdomen: soft/nontender other than very mild left lower quadrant pain with palpation/nondistended/normal bowel sounds.  Ext: no edema Skin: warm, dry Neuro: rises slowly from chair Declines rectal for temperature- unable to find on oral or axillary- skin slightly cool but in no acute distress doubt hypothermia  Assessment/Plan:  Other notes: 1.  Lightheadedness.  Family and patient are concerned this could be medication induced.  I went through multiple medications which we could reduce or stop this-see AVS.  After discussion he still thinks the benefits the medications outweigh the risks.  I encouraged him to use his cane regularly.  I really think reducing or stopping benzodiazepine would be an ideal first step particularly with long-term narcotic use.   Essential hypertension Blood pressure is well controlled on  propranolol 40mg  BID+ amlodipine 10mg  +Lasix 20 mg every other day . could consider reducing propranolol if HR stays in low 50s  Constipation S: left lower quadrant pain has improved with using miralax most days- more frequent bowel movements now though not everyday. States pain in left lower abdomen about 50% better but can be worse at times. Had already bene on stool softener. Finished antibiotic yesterday for UTI. Left hip doing better today.  A/P: Given patient is improving we will continue current treatment.  I wish he had complete resolution but other options for evaluation include CT scan with contrast and given GFR in the 30s I do not think this is ideal.  We will monitor only for now.  CKD (chronic kidney disease), stage III Once again getting a CT scan with contrast is not ideal given his CKD stage III with GFR in the 30s. he follows with nephrology.  GFR and creatinine were stable earlier this month  Diastolic CHF ([facility]) Fluid  status is stable.  No increased edema.  He is compliant with Lasix 20 mg every other day.   Future Appointments  Date Time Provider [place]  09/27/2017  8:20 AM Bayard Hugger, NP [facility]  11/10/2017  8:45 AM Tat, Eustace Quail, DO [facility] None   We did not schedule follow-up.  He will follow-up if he is not continuing to improve or symptoms worsen.  Return precautions advised.  Garret Reddish, MD

## 2017-09-19 NOTE — Patient Instructions (Addendum)
No changes today  We know some of your medicines can cause balance issues like diazepam, zanaflex, gabapentin, percocet, flomax. We discussed reducing some of these but I understand you dont want pain to increase- we may be forced to change them in the future.   I would strongly consider keeping your cane with you at all times

## 2017-09-19 NOTE — Assessment & Plan Note (Signed)
Fluid status is stable.  No increased edema.  He is compliant with Lasix 20 mg every other day.

## 2017-09-25 ENCOUNTER — Encounter (HOSPITAL_BASED_OUTPATIENT_CLINIC_OR_DEPARTMENT_OTHER): Payer: Medicare HMO | Admitting: Registered Nurse

## 2017-09-25 ENCOUNTER — Encounter: Payer: Self-pay | Admitting: Registered Nurse

## 2017-09-25 VITALS — BP 107/60 | HR 50 | Ht 66.0 in | Wt 180.0 lb

## 2017-09-25 DIAGNOSIS — G894 Chronic pain syndrome: Secondary | ICD-10-CM

## 2017-09-25 DIAGNOSIS — M47816 Spondylosis without myelopathy or radiculopathy, lumbar region: Secondary | ICD-10-CM

## 2017-09-25 DIAGNOSIS — G8929 Other chronic pain: Secondary | ICD-10-CM

## 2017-09-25 DIAGNOSIS — Z5181 Encounter for therapeutic drug level monitoring: Secondary | ICD-10-CM | POA: Diagnosis not present

## 2017-09-25 DIAGNOSIS — I739 Peripheral vascular disease, unspecified: Secondary | ICD-10-CM | POA: Diagnosis not present

## 2017-09-25 DIAGNOSIS — Z79899 Other long term (current) drug therapy: Secondary | ICD-10-CM

## 2017-09-25 DIAGNOSIS — M1712 Unilateral primary osteoarthritis, left knee: Secondary | ICD-10-CM | POA: Diagnosis not present

## 2017-09-25 DIAGNOSIS — M25562 Pain in left knee: Secondary | ICD-10-CM

## 2017-09-25 DIAGNOSIS — M25551 Pain in right hip: Secondary | ICD-10-CM | POA: Diagnosis not present

## 2017-09-25 MED ORDER — OXYCODONE-ACETAMINOPHEN 7.5-325 MG PO TABS: 1.0000 | ORAL_TABLET | Freq: Two times a day (BID) | ORAL | 0 refills | Status: DC | PRN

## 2017-09-25 NOTE — Progress Notes (Signed)
Subjective:    Patient ID: Kevin [name], male    DOB: [birth year], 77 y.o.   MRN: [id number]  HPI: [name] is a 77 year old male who returns for follow up appointment for chronic pain and medication refill. He states his pain is located in his lower back and left knee. Also reports he's experienciing increase intensity of lower back pack with household and outside chores. We will increase his tablets this month ( 70 tablets) he verbalizes understanding,we will re-evaluate next month. He was prescribe oxycodone 7.5/325 mg 90 tablets, according to the PMP Aware Web-site/ by [facility] on 05/19/2017. This was prior to becoming an established patient with our office. He rates pain 6. His current exercise regime is walking and performing stretching exercises.   Arrived bradycardic apical pulse 60.    Kevin [name]  Morphine equivalent is 24.00 MME. He is also prescribed Diazepam  by Dr. [name]. We have discussed the black box warning of using opioids and benzodiazepines. I highlighted the dangers of using these drugs together and discussed the adverse events including respiratory suppression, overdose, cognitive impairment and importance of  compliance with current regimen. He verbalizes understanding, we will continue to monitor and adjust as indicated.    Pain Inventory Average Pain 5 Pain Right Now 6 My pain is constant and aching  In the last 24 hours, has pain interfered with the following? General activity 6 Relation with others 4 Enjoyment of life 6 What TIME of day is your pain at its worst? morning, evening Sleep (in general) Fair  Pain is worse with: walking, bending and some activites Pain improves with: rest and medication Relief from Meds: 8  Mobility walk with assistance use a cane how many minutes can you walk? 10 ability to climb steps?  yes do you drive?  yes  Function retired  Neuro/Psych trouble walking  Prior Studies Any changes since last visit?   no  Physicians involved in your care Any changes since last visit?  no   Family History  Problem Relation Age of Onset  . Parkinsonism Father   . Dementia Father   . Cancer Father        Throat  . Colon cancer Mother        died in 56s  . Cancer Mother        Colon   Social History   Socioeconomic History  . Marital status: Married    Spouse name: Not on file  . Number of children: Not on file  . Years of education: Not on file  . Highest education level: Not on file  Occupational History  . Occupation: retired    Comment: maintenance; electrician  Social Needs  . Financial resource strain: Not on file  . Food insecurity:    Worry: Not on file    Inability: Not on file  . Transportation needs:    Medical: Not on file    Non-medical: Not on file  Tobacco Use  . Smoking status: Former Smoker    Packs/day: 2.00    Years: 28.00    Pack years: 56.00    Types: Cigarettes    Last attempt to quit: 05/31/1987    Years since quitting: 30.3  . Smokeless tobacco: Never Used  . Tobacco comment: quit smoking "in my 39's"  Substance and Sexual Activity  . Alcohol use: Yes    Alcohol/week: 0.0 oz    Comment: rare, mikes hard lemonade  . Drug use: No  Types: Marijuana    Comment: hx of-4-5 yrs ago  . Sexual activity: Not Currently  Lifestyle  . Physical activity:    Days per week: Not on file    Minutes per session: Not on file  . Stress: Not on file  Relationships  . Social connections:    Talks on phone: Not on file    Gets together: Not on file    Attends religious service: Not on file    Active member of club or organization: Not on file    Attends meetings of clubs or organizations: Not on file    Relationship status: Not on file  Other Topics Concern  . Not on file  Social History Narrative   Married 33 years in 2015. Lived together for 12 years. 2 boys from first wife. 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       Hobbies: yardwork, [place] barn, woodwork, Orthoptist   Past Surgical History:  Procedure Laterality Date  . ABDOMINAL AORTIC ANEURYSM REPAIR  2010  . CAROTID ENDARTERECTOMY Left 09/20/2004  . CATARACT EXTRACTION, BILATERAL     2016-2017  . COLONOSCOPY    . ENDARTERECTOMY Right 10/12/2015   Procedure: RIGHT CAROTID ENDARTERECTOMY WITH PATCH ANGIOPLASTY;  Surgeon: Elam Dutch, MD;  Location: [facility];  Service: Vascular;  Laterality: Right;  . INGUINAL HERNIA REPAIR Left 02/05/2013   Procedure: HERNIA REPAIR INGUINAL ADULT;  Surgeon: Joyice Faster. Cornett, MD;  Location: [facility];  Service: General;  Laterality: Left;  . INGUINAL HERNIA REPAIR Left   . INSERTION OF MESH Left 02/05/2013   Procedure: INSERTION OF MESH;  Surgeon: Joyice Faster. Cornett, MD;  Location: [facility];  Service: General;  Laterality: Left;  . SHOULDER ARTHROSCOPY WITH ROTATOR CUFF REPAIR AND SUBACROMIAL DECOMPRESSION Left 05/01/2014   Procedure: LEFT ARTHROSCOPY SHOULDER SUBACROMIAL DECOMPRESSION,DISTAL CLAVICAL RESECTION AND ROTATOR CUFF REPAIR;  Surgeon: Marin Shutter, MD;  Location: [facility];  Service: Orthopedics;  Laterality: Left;  . TESTICLAR CYST EXCISION Left   . TONSILLECTOMY    . VASECTOMY     Past Medical History:  Diagnosis Date  . Arthritis    DDD- Lower Back  . Bradycardia   . CAP (community acquired pneumonia) 03/17/2015   "THAT'S WHAT THEY ARE THINKING; THEY ARE NOT SURE"  . Carotid artery occlusion   . Chronic lower back pain    DDD  . CKD (chronic kidney disease)    Dr. Corliss Parish  . Diverticulosis   . Enlarged prostate    takes Flomax daily  . History of blood transfusion    "probably when they did aortic aneurysm"  . History of colon polyps    benign  . Hyperlipidemia   . Hypertension    takes Amlodipine and Zebeta daily  . Insomnia    takes Melatonin nightly  . Joint  pain   . 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     . Muscle spasm    takes Zanaflex daily as needed  . Peripheral edema    takes Furosemide daily  . Pneumonia    hx of   . Rheumatic fever 1946  . Rheumatoid arthritis ([place])   . Short-term memory loss    minimal  . Shortness of breath dyspnea    with exertion but lasts very  short period of time  . TIA (transient ischemic attack)    x 2;takes Plavix daily  . Urinary frequency    takes Flomax daily  . Urinary urgency    BP 107/60 (BP Location: Left Arm, Patient Position: Sitting, Cuff Size: Normal)   Pulse (!) 50   Ht 5\' 6"  (1.676 m)   Wt 180 lb (81.6 kg)   SpO2 95%   BMI 29.05 kg/m   Opioid Risk Score:   Fall Risk Score:  `1  Depression screen PHQ 2/9  Depression screen [facility] 2/9 07/03/2017 09/08/2016 10/20/2015 07/02/2015 03/26/2015 09/22/2014 06/24/2013  Decreased Interest 0 0 0 0 0 0 1  Down, Depressed, Hopeless 0 0 0 0 0 0 0  PHQ - 2 Score 0 0 0 0 0 0 1  Some recent data might be hidden    Review of Systems  Constitutional: Positive for appetite change.  HENT: Negative.   Eyes: Negative.   Respiratory: Negative.   Cardiovascular: Negative.   Gastrointestinal: Negative.   Endocrine: Negative.   Genitourinary: Negative.   Musculoskeletal: Positive for arthralgias, back pain and gait problem.  Skin: Negative.   Allergic/Immunologic: Negative.   Neurological: Positive for weakness.  Hematological: Negative.   Psychiatric/Behavioral: Negative.        Objective:   Physical Exam  Constitutional: He is oriented to person, place, and time. He appears well-developed and well-nourished.  HENT:  Head: Normocephalic and atraumatic.  Neck: Normal range of motion. Neck supple.  Cardiovascular: Normal rate.  Pulmonary/Chest: Effort normal and breath sounds normal.  Musculoskeletal:  Normal Muscle Bulk and Muscle Testing Reveals: Upper Extremities: Full ROM and Muscle Strength 5/5 Lumbar Paraspinal  Tenderness: L-3-L-5 Lower Extremities: Right: Full ROM and Muscle Strength 5/5 Left: Decreased ROM and Muscle Strength 4/5 Arises from chair slowly using straight cane for support Antalgic Gait    Neurological: He is alert and oriented to person, place, and time.  Nursing note and vitals reviewed.         Assessment & Plan:  1. Lumbar Spondylosis/ Lumbar Stenosis: Continue current medication regime. Refilled: Increased:  Oxycodone 7.5/325 mg one tablet twice daily as needed , may take an extra tablet when pain is moderate 10 days out of the month. for #70.09/25/2017 We will continue the opioid monitoring program, this consists of regular clinic visits, examinations, urine drug screen, pill counts as well as use of [place] Controlled Substance Reporting System. 2. Chronic Left Knee Pain: Orthopedist Following Dr. Paulla Fore. 09/25/2017   15 minutes of face to face patient care time was spent during this visit. All questions were encouraged and answered.

## 2017-09-27 ENCOUNTER — Encounter: Payer: Medicare HMO | Admitting: Registered Nurse

## 2017-09-29 ENCOUNTER — Ambulatory Visit: Payer: Self-pay

## 2017-09-29 ENCOUNTER — Encounter: Payer: Self-pay | Admitting: Family Medicine

## 2017-09-29 ENCOUNTER — Ambulatory Visit (INDEPENDENT_AMBULATORY_CARE_PROVIDER_SITE_OTHER): Payer: Medicare HMO | Admitting: Family Medicine

## 2017-09-29 VITALS — BP 126/68 | HR 57 | Temp 98.7°F | Ht 66.0 in | Wt 178.2 lb

## 2017-09-29 DIAGNOSIS — J329 Chronic sinusitis, unspecified: Secondary | ICD-10-CM | POA: Diagnosis not present

## 2017-09-29 DIAGNOSIS — B9789 Other viral agents as the cause of diseases classified elsewhere: Secondary | ICD-10-CM

## 2017-09-29 MED ORDER — METHYLPREDNISOLONE ACETATE 40 MG/ML IJ SUSP
40.0000 mg | Freq: Once | INTRAMUSCULAR | Status: AC
  Administered 2017-09-29: 40 mg via INTRAMUSCULAR

## 2017-09-29 NOTE — Patient Instructions (Signed)
Sinsusitis Viral based on <10 days, no double sickening, lack of severity of symptoms in first 3 days. Educated on signs that bacterial infection may have developed (symptoms over 10 days, double sickening).  His lungs were clear and I do not suspect bronchitis or pneumonia.  Treatment: -considered steroid: we opted in- steroid injection with depo medrol today -other symptomatic care with mucinex over the counter or mucinex- DM if cough bothering you. Make sure to drink a full glass of water with this if you take it -Antibiotic indicated: no-but patient is to call me if he gets better than gets worse or if symptoms last to next Friday and I will call him in an antibiotic at that point as he would meet criteria for bacterial sinusitis  Finally, we reviewed reasons to return to care including if symptoms worsen or persist or new concerns arise (particularly fever or shortness of breath)

## 2017-09-29 NOTE — Telephone Encounter (Signed)
  Pt's wife called to report pt's sinus pain and fever and wheezing. Pt sx started yesterday. Pt having light green to green drainage. Pt with cold chills yesterday. Denies headache. Pt having scratchy throat, mild cough also. Appt made with Dr Dimas Chyle  for 4:00 pm today.  Reason for Disposition . [1] Sinus pain (not just congestion) AND [2] fever    And wheezing  Answer Assessment - Initial Assessment Questions 1. LOCATION: "Where does it hurt?"      yesterday 2. ONSET: "When did the sinus pain start?"  (e.g., hours, days)      yesterday 3. SEVERITY: "How bad is the pain?"   (Scale 1-10; mild, moderate or severe)   - MILD (1-3): doesn't interfere with normal activities    - MODERATE (4-7): interferes with normal activities (e.g., work or school) or awakens from sleep   - SEVERE (8-10): excruciating pain and patient unable to do any normal activities        none 4. RECURRENT SYMPTOM: "Have you ever had sinus problems before?" If so, ask: "When was the last time?" and "What happened that time?"      Few years ago- took Claritin or Allegra and helped sx 5. NASAL CONGESTION: "Is the nose blocked?" If so, ask, "Can you open it or must you breathe through the mouth?"     One side is blocked and the other side has drainage 6. NASAL DISCHARGE: "Do you have discharge from your nose?" If so ask, "What color?"     Yes-lt green to green 7. FEVER: "Do you have a fever?" If so, ask: "What is it, how was it measured, and when did it start?"      Cold chills yesterday  8. OTHER SYMPTOMS: "Do you have any other symptoms?" (e.g., sore throat, cough, earache, difficulty breathing)     Scratchy throat, mild cough, wheezing noted 9. PREGNANCY: "Is there any chance you are pregnant?" "When was your last menstrual period?"     n/a  Protocols used: SINUS PAIN OR CONGESTION-A-AH

## 2017-09-29 NOTE — Progress Notes (Signed)
PCP: Marin Olp, MD  Subjective:  Kevin [name] is a 77 y.o. year old very pleasant male patient who presents with sinusitis symptoms including nasal congestion, sinus tenderness frontal -other symptoms include: runny nose. Blowing out clear discharge from nose. He felt chilled yesterday so wife thought he had a fever. Doesn't feel like he can take a good deep breath. No sore throat. Symptoms worsening today. He states minimal cough yesterday. He feels run down.  -day of illness:2 -Symptoms are worsening today -previous treatments: no treatments other than water or gatorade -sick contacts/travel/risks: denies flu exposure.   ROS-denies fever, SOB, NVD, tooth pain  Pertinent Past Medical History-  Patient Active Problem List   Diagnosis Date Noted  . PAD (peripheral artery disease) ([place]) 01/08/2016    Priority: High  . Diastolic CHF ([place]) 75/64/3329    Priority: High  . CKD (chronic kidney disease), stage III ([place]) 01/27/2014    Priority: High  . AAA (abdominal aortic aneurysm, ruptured) ([place]) 01/17/2013    Priority: High  . Carotid artery stenosis s/p L carotid endarterectomy 01/12/2012    Priority: High  . History of CVA (cerebrovascular accident) 11/17/2011    Priority: High  . Chronic pain syndrome 12/23/2009    Priority: High  . Constipation 09/19/2017    Priority: Medium  . Personal history of colonic polyps 05/15/2013    Priority: Medium  . Essential tremor 10/05/2009    Priority: Medium  . UNSPECIFIED ANEMIA 12/05/2008    Priority: Medium  . BPH (benign prostatic hyperplasia) 06/04/2007    Priority: Medium  . Fasting hyperglycemia 02/05/2007    Priority: Medium  . Hyperlipidemia 01/03/2007    Priority: Medium  . Essential hypertension 01/03/2007    Priority: Medium  . CAP (community acquired pneumonia) 03/17/2015    Priority: Low  . Family history of malignant neoplasm of gastrointestinal tract 05/15/2013    Priority: Low  . Inguinal hernia 05/01/2012     Priority: Low  . BURSITIS, HIP 12/21/2009    Priority: Low  . ACTINIC KERATOSIS, EAR, LEFT 03/09/2009    Priority: Low  . Primary osteoarthritis of left knee 11/02/2007    Priority: Low  . 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 Lumbar spondylosis 03/17/2017    Medications- reviewed  Current Outpatient Medications  Medication Sig Dispense Refill  . albuterol (VENTOLIN HFA) 108 (90 Base) MCG/ACT inhaler Inhale 2 puffs into the lungs every 6 (six) hours as needed for wheezing or shortness of breath.    Marland Kitchen amLODipine (NORVASC) 10 MG tablet Take 1 tablet (10 mg total) by mouth daily. 90 tablet 1  . atorvastatin (LIPITOR) 20 MG tablet Take 1 tablet (20 mg total) by mouth daily. 90 tablet 1  . BIOTIN PO Take 1 tablet by mouth daily.    . Cholecalciferol (VITAMIN D-3) 1000 UNITS CAPS Take 1 capsule by mouth daily.    . clopidogrel (PLAVIX) 75 MG tablet Take 1 tablet (75 mg total) by mouth daily. 90 tablet 1  . diazepam (VALIUM) 5 MG tablet Take 0.5-1 tablets (2.5-5 mg total) by mouth every 8 (eight) hours as needed for muscle spasms or sedation. 90 tablet 1  . fenofibrate 160 MG tablet Take 1 tablet (160 mg total) by mouth daily. 90 tablet 3  . furosemide (LASIX) 20 MG tablet Take 1 tablet (20 mg total) by mouth every other day. 90 tablet 1  . gabapentin (NEURONTIN) 300 MG capsule Take 1 capsule (300 mg total)  by mouth 2 (two) times daily. 180 capsule 1  . Melatonin 3 MG TABS Take 5 mg by mouth at bedtime.     Marland Kitchen oxyCODONE-acetaminophen (PERCOCET) 7.5-325 MG tablet Take 1 tablet by mouth 2 (two) times daily as needed for moderate pain. May take an extra tablet when pain is severe, 10 days only 70 tablet 0  . propranolol (INDERAL) 40 MG tablet Take 1 tablet (40 mg total) by mouth 2 (two) times daily. 180 tablet 3  . tamsulosin (FLOMAX) 0.4 MG CAPS capsule Take 1 capsule (0.4 mg total) by mouth daily. (Patient taking differently: Take 0.4 mg by mouth every evening. ) 90  capsule 3  . tiZANidine (ZANAFLEX) 4 MG tablet TAKE 1 TABLET BY MOUTH  EVERY 6 HOURS AS NEEDED FOR MUSCLE SPASMS 90 tablet 1  . triamcinolone cream (KENALOG) 0.1 % APPLY 1 APPLICATION 2 TIMES DAILY TOPICALLY FOR 7-10 DAYS 80 g 0  . vitamin B-12 (CYANOCOBALAMIN) 1000 MCG tablet Take 1,000 mcg by mouth daily.     No current facility-administered medications for this visit.     Objective: BP 126/68 (BP Location: Left Arm, Patient Position: Sitting, Cuff Size: Large)   Pulse (!) 57   Temp 98.7 F (37.1 C) (Oral)   Ht 5\' 6"  (1.676 m)   Wt 178 lb 3.2 oz (80.8 kg)   SpO2 97%   BMI 28.76 kg/m  Gen: NAD, resting comfortably HEENT: Turbinates erythematous with clear drainage, TM normal, pharynx mildly erythematous with no tonsilar exudate or edema, frontal sinus tenderness CV: RRR no murmurs rubs or gallops Lungs: CTAB no crackles, wheeze, rhonchi Abdomen: soft/nontender/nondistended/normal bowel sounds.  Ext: no edema Skin: warm, dry, no rash Neuro: grossly normal, moves all extremities  Assessment/Plan:  Sinsusitis Viral based on <10 days, no double sickening, lack of severity of symptoms in first 3 days. Educated on signs that bacterial infection may have developed (symptoms over 10 days, double sickening).  His lungs were clear and I do not suspect bronchitis or pneumonia.  Treatment: -considered steroid: we opted in- steroid injection with depo medrol 40 mg IM - he did have history fo AAA rupture but s/p repair in 2010- I do not think this injection will substantially increase risk of recurrent rupture.  -other symptomatic care with mucinex over the counter or mucinex- DM if cough bothering you. Make sure to drink a full glass of water with this if you take it -Antibiotic indicated: no-but patient is to call me if he gets better than gets worse or if symptoms last to next Friday and I will call him in an antibiotic at that point as he would meet criteria for bacterial  sinusitis  Finally, we reviewed reasons to return to care including if symptoms worsen or persist or new concerns arise (particularly fever or shortness of breath)  Viral sinusitis - Plan: methylPREDNISolone acetate (DEPO-MEDROL) injection 40 mg   Garret Reddish, MD

## 2017-10-04 NOTE — Progress Notes (Signed)
Subjective:   Kevin [name] is a 77 y.o. male who presents for Medicare Annual/Subsequent preventive examination.  Reports health as 09/19/2017   Diet Chol/hdl 3 A1c 6.0  PSA 11/2015 BMI 27  Breakfast Oatmeal;  Lunch and dinner  Wife still works and then she cooks    Exercise Gets outside and works  Does not exercise but stays busy Discussed stroke in 2013   Admits balance is not as good as it was   Former smoker; 56 pack years  AAA repair 2010   There are no preventive care reminders to display for this patient.  Colonoscopy 05/2013 - does not have to have anymore     Quit smoking in the 40's  Has test q 6 months to check AAA repair   Back pain and medicine is helping      Objective:    Vitals: BP 140/70   Pulse (!) 51   Ht 5\' 7"  (1.702 m)   Wt 177 lb 4 oz (80.4 kg)   SpO2 98%   BMI 27.76 kg/m   Body mass index is 27.76 kg/m.  Advanced Directives 10/05/2017 09/08/2016 09/02/2016 09/01/2016 08/04/2016 10/20/2015 10/12/2015  Does Patient Have a Medical Advance Directive? Yes Yes No No Yes Yes No  Type of Advance Directive - - - - Press photographer;Living will Living will -  Does patient want to make changes to medical advance directive? - - - - - - -  Copy of [place] in Chart? - - - - - - -  Would patient like information on creating a medical advance directive? - - No - Patient declined - - - -  Pre-existing out of facility DNR order (yellow form or pink MOST form) - - - - - - -    Tobacco Social History   Tobacco Use  Smoking Status Former Smoker  . Packs/day: 2.00  . Years: 28.00  . Pack years: 56.00  . Types: Cigarettes  . Last attempt to quit: 05/31/1987  . Years since quitting: 30.3  Smokeless Tobacco Never Used  Tobacco Comment   quit smoking "in my 70's"     Counseling given: Not Answered Comment: quit smoking "in my 41's"   Clinical Intake:   Past Medical History:  Diagnosis Date  . Arthritis    DDD- Lower  Back  . Bradycardia   . CAP (community acquired pneumonia) 03/17/2015   "THAT'S WHAT THEY ARE THINKING; THEY ARE NOT SURE"  . Carotid artery occlusion   . Chronic lower back pain    DDD  . CKD (chronic kidney disease)    Dr. Corliss Parish  . Diverticulosis   . Enlarged prostate    takes Flomax daily  . History of blood transfusion    "probably when they did aortic aneurysm"  . History of colon polyps    benign  . Hyperlipidemia   . Hypertension    takes Amlodipine and Zebeta daily  . Insomnia    takes Melatonin nightly  . Joint pain   . 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     . Muscle spasm    takes Zanaflex daily as needed  . Peripheral edema    takes Furosemide daily  . Pneumonia    hx of   . Rheumatic fever 1946  . Rheumatoid arthritis ([place])   . Short-term memory loss    minimal  . Shortness of breath dyspnea    with exertion but  lasts very short period of time  . TIA (transient ischemic attack)    x 2;takes Plavix daily  . Urinary frequency    takes Flomax daily  . Urinary urgency    Past Surgical History:  Procedure Laterality Date  . ABDOMINAL AORTIC ANEURYSM REPAIR  2010  . CAROTID ENDARTERECTOMY Left 09/20/2004  . CATARACT EXTRACTION, BILATERAL     2016-2017  . COLONOSCOPY    . ENDARTERECTOMY Right 10/12/2015   Procedure: RIGHT CAROTID ENDARTERECTOMY WITH PATCH ANGIOPLASTY;  Surgeon: Elam Dutch, MD;  Location: [facility];  Service: Vascular;  Laterality: Right;  . INGUINAL HERNIA REPAIR Left 02/05/2013   Procedure: HERNIA REPAIR INGUINAL ADULT;  Surgeon: Joyice Faster. Cornett, MD;  Location: [facility];  Service: General;  Laterality: Left;  . INGUINAL HERNIA REPAIR Left   . INSERTION OF MESH Left 02/05/2013   Procedure: INSERTION OF MESH;  Surgeon: Joyice Faster. Cornett, MD;  Location: [facility];  Service: General;  Laterality: Left;  . SHOULDER ARTHROSCOPY WITH ROTATOR CUFF REPAIR AND SUBACROMIAL  DECOMPRESSION Left 05/01/2014   Procedure: LEFT ARTHROSCOPY SHOULDER SUBACROMIAL DECOMPRESSION,DISTAL CLAVICAL RESECTION AND ROTATOR CUFF REPAIR;  Surgeon: Marin Shutter, MD;  Location: [facility];  Service: Orthopedics;  Laterality: Left;  . TESTICLAR CYST EXCISION Left   . TONSILLECTOMY    . VASECTOMY     Family History  Problem Relation Age of Onset  . Parkinsonism Father   . Dementia Father   . Cancer Father        Throat  . Colon cancer Mother        died in 51s  . Cancer Mother        Colon   Social History   Socioeconomic History  . Marital status: Married    Spouse name: Not on file  . Number of children: Not on file  . Years of education: Not on file  . Highest education level: Not on file  Occupational History  . Occupation: retired    Comment: maintenance; electrician  Social Needs  . Financial resource strain: Not on file  . Food insecurity:    Worry: Not on file    Inability: Not on file  . Transportation needs:    Medical: Not on file    Non-medical: Not on file  Tobacco Use  . Smoking status: Former Smoker    Packs/day: 2.00    Years: 28.00    Pack years: 56.00    Types: Cigarettes    Last attempt to quit: 05/31/1987    Years since quitting: 30.3  . Smokeless tobacco: Never Used  . Tobacco comment: quit smoking "in my 50's"  Substance and Sexual Activity  . Alcohol use: Yes    Alcohol/week: 0.0 oz    Comment: rare, mikes hard lemonade  . Drug use: No    Types: Marijuana    Comment: hx of-4-5 yrs ago  . Sexual activity: Not Currently  Lifestyle  . Physical activity:    Days per week: Not on file    Minutes per session: Not on file  . Stress: Not on file  Relationships  . Social connections:    Talks on phone: Not on file    Gets together: Not on file    Attends religious service: Not on file    Active member of club or organization: Not on file    Attends meetings of clubs or organizations: Not on file    Relationship status: Not on file  Other Topics Concern  . Not on file  Social History Narrative   Married 33 years in 2015. Lived together for 12 years. 2 boys from first wife. 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       Hobbies: yardwork, [place] barn, woodwork, collect knive    Outpatient Encounter Medications as of 10/05/2017  Medication Sig  . albuterol (VENTOLIN HFA) 108 (90 Base) MCG/ACT inhaler Inhale 2 puffs into the lungs every 6 (six) hours as needed for wheezing or shortness of breath.  Marland Kitchen amLODipine (NORVASC) 10 MG tablet Take 1 tablet (10 mg total) by mouth daily.  Marland Kitchen atorvastatin (LIPITOR) 20 MG tablet Take 1 tablet (20 mg total) by mouth daily.  Marland Kitchen BIOTIN PO Take 1 tablet by mouth daily.  . Cholecalciferol (VITAMIN D-3) 1000 UNITS CAPS Take 1 capsule by mouth daily.  . clopidogrel (PLAVIX) 75 MG tablet Take 1 tablet (75 mg total) by mouth daily.  . diazepam (VALIUM) 5 MG tablet Take 0.5-1 tablets (2.5-5 mg total) by mouth every 8 (eight) hours as needed for muscle spasms or sedation.  . fenofibrate 160 MG tablet Take 1 tablet (160 mg total) by mouth daily.  . furosemide (LASIX) 20 MG tablet Take 1 tablet (20 mg total) by mouth every other day.  . gabapentin (NEURONTIN) 300 MG capsule Take 1 capsule (300 mg total) by mouth 2 (two) times daily.  . Melatonin 3 MG TABS Take 5 mg by mouth at bedtime.   Marland Kitchen oxyCODONE-acetaminophen (PERCOCET) 7.5-325 MG tablet Take 1 tablet by mouth 2 (two) times daily as needed for moderate pain. May take an extra tablet when pain is severe, 10 days only  . propranolol (INDERAL) 40 MG tablet Take 1 tablet (40 mg total) by mouth 2 (two) times daily.  . tamsulosin (FLOMAX) 0.4 MG CAPS capsule Take 1 capsule (0.4 mg total) by mouth daily. (Patient taking differently: Take 0.4 mg by mouth every evening. )  . tiZANidine (ZANAFLEX) 4 MG tablet TAKE 1 TABLET BY MOUTH  EVERY 6  HOURS AS NEEDED FOR MUSCLE SPASMS  . triamcinolone cream (KENALOG) 0.1 % APPLY 1 APPLICATION 2 TIMES DAILY TOPICALLY FOR 7-10 DAYS  . vitamin B-12 (CYANOCOBALAMIN) 1000 MCG tablet Take 1,000 mcg by mouth daily.   No facility-administered encounter medications on file as of 10/05/2017.     Activities of Daily Living No flowsheet data found.  Patient Care Team: Marin Olp, MD as PCP - General (Family Medicine)   Assessment:   This is a routine wellness examination for Kevin [name].  Exercise Activities and Dietary recommendations    Goals    . Patient Stated     To maintain health        Fall Risk Fall Risk  10/05/2017 09/25/2017 09/19/2017 08/28/2017 08/07/2017  Falls in the past year? Yes (No Data) Yes No No  Comment backed up and heel hit a paver  last fall April 2019 - - -  Number falls in past yr: 1 1 1  - -  Comment - - April 19th afternoon - -  Injury with Fall? - Yes Yes - -  Risk Factor Category  - High Fall Risk High Fall Risk - -  Risk for fall due to : - - Impaired mobility - -  Follow up Education provided - - - -  Comment not watching  where he was going backing up - - - -     Depression Screen PHQ 2/9 Scores 09/25/2017 07/03/2017 09/08/2016 10/20/2015  PHQ - 2 Score 0 0 0 0    Cognitive Function Ad8 score reviewed for issues:  Issues making decisions:  Less interest in hobbies / activities:  Repeats questions, stories (family complaining):  Trouble using ordinary gadgets (microwave, computer, phone):  Forgets the month or year:   Mismanaging finances:   Remembering appts:  Daily problems with thinking and/or memory: Ad8 score is=0 Hx of stroke; very verbal             Immunization History  Administered Date(s) Administered  . Influenza Whole 05/30/1997  . Pneumococcal Conjugate-13 12/22/2014  . Pneumococcal Polysaccharide-23 03/28/2013  . Td 05/30/1994, 02/05/2007, 07/06/2017      Screening Tests Health Maintenance  Topic Date Due  .  INFLUENZA VACCINE  04/17/2018 (Originally 12/28/2017)  . COLONOSCOPY  06/20/2018  . TETANUS/TDAP  07/07/2027  . PNA vac Low Risk Adult  Completed         Plan:      PCP Notes   Health Maintenance Declines shingrix  There are no preventive care reminders to display for this patient.    Abnormal Screens  none    Referrals  none  Patient concerns; Losing weight over the last few years- BMI 27 Agreed to use the straps at home for exercise.  Nurse Concerns; Balance issues; get up and go WNL; did stagger x 1 when turning from walking to the door; wife made him get an help line in case he falls  Next PCP apt 09/30/2016     I have personally reviewed and noted the following in the patient's chart:   . Medical and social history . Use of alcohol, tobacco or illicit drugs  . Current medications and supplements . Functional ability and status . Nutritional status . Physical activity . Advanced directives . List of other physicians . Hospitalizations, surgeries, and ER visits in previous 12 months . Vitals . Screenings to include cognitive, depression, and falls . Referrals and appointments  In addition, I have reviewed and discussed with patient certain preventive protocols, quality metrics, and best practice recommendations. A written personalized care plan for preventive services as well as general preventive health recommendations were provided to patient.     Wynetta Fines, RN  10/05/2017

## 2017-10-05 ENCOUNTER — Encounter: Payer: Self-pay | Admitting: Sports Medicine

## 2017-10-05 ENCOUNTER — Ambulatory Visit (INDEPENDENT_AMBULATORY_CARE_PROVIDER_SITE_OTHER): Payer: Medicare HMO | Admitting: *Deleted

## 2017-10-05 VITALS — BP 140/70 | HR 51 | Ht 67.0 in | Wt 177.2 lb

## 2017-10-05 DIAGNOSIS — Z0000 Encounter for general adult medical examination without abnormal findings: Secondary | ICD-10-CM | POA: Diagnosis not present

## 2017-10-05 NOTE — Progress Notes (Signed)
I have reviewed and agree with note, evaluation, plan.  May need to consider assistive device for walking.  Garret Reddish, MD

## 2017-10-05 NOTE — Patient Instructions (Addendum)
[name] , Thank you for taking time to come for your Medicare Wellness Visit. I appreciate your ongoing commitment to your health goals. Please review the following plan we discussed and let me know if I can assist you in the future.   To schedule your eye exam  Shingrix is a vaccine for the prevention of Shingles in Adults 50 and older.  If you are on Medicare, the shingrix is covered under your Part D plan, so you will take both of the vaccines in the series at your pharmacy. Please check with your benefits regarding applicable copays or out of pocket expenses.  The Shingrix is given in 2 vaccines approx 8 weeks apart. You must receive the 2nd dose prior to 6 months from receipt of the first. Please have the pharmacist print out you Immunization  dates for our office records  Declines for now   Stay busy and active   These are the goals we discussed: Goals    . Patient Stated     To maintain health        This is a list of the screening recommended for you and due dates:  Health Maintenance  Topic Date Due  . Flu Shot  04/17/2018*  . Colon Cancer Screening  06/20/2018  . Tetanus Vaccine  07/07/2027  . Pneumonia vaccines  Completed  *Topic was postponed. The date shown is not the original due date.      Fall Prevention in the Home Falls can cause injuries. They can happen to people of all ages. There are many things you can do to make your home safe and to help prevent falls. What can I do on the outside of my home?  Regularly fix the edges of walkways and driveways and fix any cracks.  Remove anything that might make you trip as you walk through a door, such as a raised step or threshold.  Trim any bushes or trees on the path to your home.  Use bright outdoor lighting.  Clear any walking paths of anything that might make someone trip, such as rocks or tools.  Regularly check to see if handrails are loose or broken. Make sure that both sides of any steps have  handrails.  Any raised decks and porches should have guardrails on the edges.  Have any leaves, snow, or ice cleared regularly.  Use sand or salt on walking paths during winter.  Clean up any spills in your garage right away. This includes oil or grease spills. What can I do in the bathroom?  Use night lights.  Install grab bars by the toilet and in the tub and shower. Do not use towel bars as grab bars.  Use non-skid mats or decals in the tub or shower.  If you need to sit down in the shower, use a plastic, non-slip stool.  Keep the floor dry. Clean up any water that spills on the floor as soon as it happens.  Remove soap buildup in the tub or shower regularly.  Attach bath mats securely with double-sided non-slip rug tape.  Do not have throw rugs and other things on the floor that can make you trip. What can I do in the bedroom?  Use night lights.  Make sure that you have a light by your bed that is easy to reach.  Do not use any sheets or blankets that are too big for your bed. They should not hang down onto the floor.  Have a firm chair  that has side arms. You can use this for support while you get dressed.  Do not have throw rugs and other things on the floor that can make you trip. What can I do in the kitchen?  Clean up any spills right away.  Avoid walking on wet floors.  Keep items that you use a lot in easy-to-reach places.  If you need to reach something above you, use a strong step stool that has a grab bar.  Keep electrical cords out of the way.  Do not use floor polish or wax that makes floors slippery. If you must use wax, use non-skid floor wax.  Do not have throw rugs and other things on the floor that can make you trip. What can I do with my stairs?  Do not leave any items on the stairs.  Make sure that there are handrails on both sides of the stairs and use them. Fix handrails that are broken or loose. Make sure that handrails are as long as  the stairways.  Check any carpeting to make sure that it is firmly attached to the stairs. Fix any carpet that is loose or worn.  Avoid having throw rugs at the top or bottom of the stairs. If you do have throw rugs, attach them to the floor with carpet tape.  Make sure that you have a light switch at the top of the stairs and the bottom of the stairs. If you do not have them, ask someone to add them for you. What else can I do to help prevent falls?  Wear shoes that: ? Do not have high heels. ? Have rubber bottoms. ? Are comfortable and fit you well. ? Are closed at the toe. Do not wear sandals.  If you use a stepladder: ? Make sure that it is fully opened. Do not climb a closed stepladder. ? Make sure that both sides of the stepladder are locked into place. ? Ask someone to hold it for you, if possible.  Clearly mark and make sure that you can see: ? Any grab bars or handrails. ? First and last steps. ? Where the edge of each step is.  Use tools that help you move around (mobility aids) if they are needed. These include: ? Canes. ? Walkers. ? Scooters. ? Crutches.  Turn on the lights when you go into a dark area. Replace any light bulbs as soon as they burn out.  Set up your furniture so you have a clear path. Avoid moving your furniture around.  If any of your floors are uneven, fix them.  If there are any pets around you, be aware of where they are.  Review your medicines with your doctor. Some medicines can make you feel dizzy. This can increase your chance of falling. Ask your doctor what other things that you can do to help prevent falls. This information is not intended to replace advice given to you by your health care provider. Make sure you discuss any questions you have with your health care provider. Document Released: 03/12/2009 Document Revised: 10/22/2015 Document Reviewed: 06/20/2014 Elsevier Interactive Patient Education  2018 [place]  Maintenance, Male A healthy lifestyle and preventive care is important for your health and wellness. Ask your health care provider about what schedule of regular examinations is right for you. What should I know about weight and diet? Eat a Healthy Diet  Eat plenty of vegetables, fruits, whole grains, low-fat dairy products, and lean protein.  Do not eat a lot of foods high in solid fats, added sugars, or salt.  Maintain a Healthy Weight Regular exercise can help you achieve or maintain a healthy weight. You should:  Do at least 150 minutes of exercise each week. The exercise should increase your heart rate and make you sweat (moderate-intensity exercise).  Do strength-training exercises at least twice a week.  Watch Your Levels of Cholesterol and Blood Lipids  Have your blood tested for lipids and cholesterol every 5 years starting at 79 years of age. If you are at high risk for heart disease, you should start having your blood tested when you are 79 years old. You may need to have your cholesterol levels checked more often if: ? Your lipid or cholesterol levels are high. ? You are older than 79 years of age. ? You are at high risk for heart disease.  What should I know about cancer screening? Many types of cancers can be detected early and may often be prevented. Lung Cancer  You should be screened every year for lung cancer if: ? You are a current smoker who has smoked for at least 30 years. ? You are a former smoker who has quit within the past 15 years.  Talk to your health care provider about your screening options, when you should start screening, and how often you should be screened.  Colorectal Cancer  Routine colorectal cancer screening usually begins at 79 years of age and should be repeated every 5-10 years until you are 79 years old. You may need to be screened more often if early forms of precancerous polyps or small growths are found. Your health care provider may  recommend screening at an earlier age if you have risk factors for colon cancer.  Your health care provider may recommend using home test kits to check for hidden blood in the stool.  A small camera at the end of a tube can be used to examine your colon (sigmoidoscopy or colonoscopy). This checks for the earliest forms of colorectal cancer.  Prostate and Testicular Cancer  Depending on your age and overall health, your health care provider may do certain tests to screen for prostate and testicular cancer.  Talk to your health care provider about any symptoms or concerns you have about testicular or prostate cancer.  Skin Cancer  Check your skin from head to toe regularly.  Tell your health care provider about any new moles or changes in moles, especially if: ? There is a change in a mole's size, shape, or color. ? You have a mole that is larger than a pencil eraser.  Always use sunscreen. Apply sunscreen liberally and repeat throughout the day.  Protect yourself by wearing long sleeves, pants, a wide-brimmed hat, and sunglasses when outside.  What should I know about heart disease, diabetes, and high blood pressure?  If you are 52-54 years of age, have your blood pressure checked every 3-5 years. If you are 5 years of age or older, have your blood pressure checked every year. You should have your blood pressure measured twice-once when you are at a hospital or clinic, and once when you are not at a hospital or clinic. Record the average of the two measurements. To check your blood pressure when you are not at a hospital or clinic, you can use: ? An automated blood pressure machine at a pharmacy. ? A home blood pressure monitor.  Talk to your health care provider about your target blood  pressure.  If you are between 51-68 years old, ask your health care provider if you should take aspirin to prevent heart disease.  Have regular diabetes screenings by checking your fasting blood  sugar level. ? If you are at a normal weight and have a low risk for diabetes, have this test once every three years after the age of 8. ? If you are overweight and have a high risk for diabetes, consider being tested at a younger age or more often.  A one-time screening for abdominal aortic aneurysm (AAA) by ultrasound is recommended for men aged 51-75 years who are current or former smokers. What should I know about preventing infection? Hepatitis B If you have a higher risk for hepatitis B, you should be screened for this virus. Talk with your health care provider to find out if you are at risk for hepatitis B infection. Hepatitis C Blood testing is recommended for:  Everyone born from [birth year] through 1967.  Anyone with known risk factors for hepatitis C.  Sexually Transmitted Diseases (STDs)  You should be screened each year for STDs including gonorrhea and chlamydia if: ? You are sexually active and are younger than 79 years of age. ? You are older than 79 years of age and your health care provider tells you that you are at risk for this type of infection. ? Your sexual activity has changed since you were last screened and you are at an increased risk for chlamydia or gonorrhea. Ask your health care provider if you are at risk.  Talk with your health care provider about whether you are at high risk of being infected with HIV. Your health care provider may recommend a prescription medicine to help prevent HIV infection.  What else can I do?  Schedule regular health, dental, and eye exams.  Stay current with your vaccines (immunizations).  Do not use any tobacco products, such as cigarettes, chewing tobacco, and e-cigarettes. If you need help quitting, ask your health care provider.  Limit alcohol intake to no more than 2 drinks per day. One drink equals 12 ounces of beer, 5 ounces of wine, or 1 ounces of hard liquor.  Do not use street drugs.  Do not share needles.  Ask your  health care provider for help if you need support or information about quitting drugs.  Tell your health care provider if you often feel depressed.  Tell your health care provider if you have ever been abused or do not feel safe at home. This information is not intended to replace advice given to you by your health care provider. Make sure you discuss any questions you have with your health care provider. Document Released: 11/12/2007 Document Revised: 01/13/2016 Document Reviewed: 02/17/2015 Elsevier Interactive Patient Education  [employer].

## 2017-10-19 ENCOUNTER — Encounter: Payer: Medicare HMO | Attending: Physical Medicine & Rehabilitation | Admitting: Registered Nurse

## 2017-10-19 ENCOUNTER — Encounter: Payer: Self-pay | Admitting: Registered Nurse

## 2017-10-19 VITALS — BP 126/68 | HR 53 | Resp 14 | Ht 67.0 in | Wt 177.0 lb

## 2017-10-19 DIAGNOSIS — Z5181 Encounter for therapeutic drug level monitoring: Secondary | ICD-10-CM

## 2017-10-19 DIAGNOSIS — M25562 Pain in left knee: Secondary | ICD-10-CM

## 2017-10-19 DIAGNOSIS — M47816 Spondylosis without myelopathy or radiculopathy, lumbar region: Secondary | ICD-10-CM | POA: Insufficient documentation

## 2017-10-19 DIAGNOSIS — M1712 Unilateral primary osteoarthritis, left knee: Secondary | ICD-10-CM | POA: Diagnosis not present

## 2017-10-19 DIAGNOSIS — G8929 Other chronic pain: Secondary | ICD-10-CM

## 2017-10-19 DIAGNOSIS — M25551 Pain in right hip: Secondary | ICD-10-CM | POA: Insufficient documentation

## 2017-10-19 DIAGNOSIS — Z79899 Other long term (current) drug therapy: Secondary | ICD-10-CM

## 2017-10-19 DIAGNOSIS — G894 Chronic pain syndrome: Secondary | ICD-10-CM | POA: Diagnosis not present

## 2017-10-19 DIAGNOSIS — I739 Peripheral vascular disease, unspecified: Secondary | ICD-10-CM | POA: Insufficient documentation

## 2017-10-19 MED ORDER — OXYCODONE-ACETAMINOPHEN 7.5-325 MG PO TABS: 1.0000 | ORAL_TABLET | Freq: Two times a day (BID) | ORAL | 0 refills | Status: DC | PRN

## 2017-10-19 NOTE — Progress Notes (Signed)
Subjective:    Patient ID: Kevin [name], male    DOB: 12/08/1940, 77 y.o.   MRN: [id number]  HPI: [name] is a 77 year old male is who returns for follow up appointment for chronic pain and medication refill. He states his pain is located in his lower back and left knee. He rates his pain 7 . His current exercise regime is walking and outside chores.   Arrived Bradycardia apical pulse 58.  Mr. [name] Morphine Equivalent is 26.25 MME. He is also prescribed Diazepam by Dr. [name]. We have discussed the black box warning of using opioids and benzodiazepines. I highlighted the dangers of using these drugs together and discussed the adverse events including respiratory suppression, overdose, cognitive impairment and importance of compliance with current regimen. We will continue to monitor and adjust as indicated.   Last Oral Swab was Performed on 06/16/2017, it was consistent.    Pain Inventory Average Pain 5 Pain Right Now 7 My pain is sharp, dull and aching  In the last 24 hours, has pain interfered with the following? General activity 5 Relation with others 3 Enjoyment of life 7 What TIME of day is your pain at its worst? evening Sleep (in general) Fair  Pain is worse with: walking, bending, standing and some activites Pain improves with: rest and medication Relief from Meds: 6  Mobility walk with assistance use a cane how many minutes can you walk? 20 ability to climb steps?  yes do you drive?  yes  Function retired  Neuro/Psych weakness tremor dizziness loss of taste or smell  Prior Studies Any changes since last visit?  no  Physicians involved in your care Any changes since last visit?  no   Family History  Problem Relation Age of Onset  . Parkinsonism Father   . Dementia Father   . Cancer Father        Throat  . Colon cancer Mother        died in 56s  . Cancer Mother        Colon   Social History   Socioeconomic History  . Marital status:  Married    Spouse name: Not on file  . Number of children: Not on file  . Years of education: Not on file  . Highest education level: Not on file  Occupational History  . Occupation: retired    Comment: maintenance; electrician  Social Needs  . Financial resource strain: Not on file  . Food insecurity:    Worry: Not on file    Inability: Not on file  . Transportation needs:    Medical: Not on file    Non-medical: Not on file  Tobacco Use  . Smoking status: Former Smoker    Packs/day: 2.00    Years: 28.00    Pack years: 56.00    Types: Cigarettes    Last attempt to quit: 05/31/1987    Years since quitting: 30.4  . Smokeless tobacco: Never Used  . Tobacco comment: quit smoking "in my 45's"  Substance and Sexual Activity  . Alcohol use: Yes    Alcohol/week: 0.0 oz    Comment: rare, mikes hard lemonade  . Drug use: No    Types: Marijuana    Comment: hx of-4-5 yrs ago  . Sexual activity: Not Currently  Lifestyle  . Physical activity:    Days per week: Not on file    Minutes per session: Not on file  . Stress: Not on  file  Relationships  . Social connections:    Talks on phone: Not on file    Gets together: Not on file    Attends religious service: Not on file    Active member of club or organization: Not on file    Attends meetings of clubs or organizations: Not on file    Relationship status: Not on file  Other Topics Concern  . Not on file  Social History Narrative   Married 33 years in 2015. Lived together for 12 years. 2 boys from first wife. 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       Hobbies: yardwork, [place] barn, woodwork, Orthoptist   Past Surgical History:  Procedure Laterality Date  . ABDOMINAL AORTIC ANEURYSM REPAIR  2010  . CAROTID ENDARTERECTOMY Left 09/20/2004  . CATARACT EXTRACTION, BILATERAL     2016-2017  . COLONOSCOPY    .  ENDARTERECTOMY Right 10/12/2015   Procedure: RIGHT CAROTID ENDARTERECTOMY WITH PATCH ANGIOPLASTY;  Surgeon: Elam Dutch, MD;  Location: [facility];  Service: Vascular;  Laterality: Right;  . INGUINAL HERNIA REPAIR Left 02/05/2013   Procedure: HERNIA REPAIR INGUINAL ADULT;  Surgeon: Joyice Faster. Cornett, MD;  Location: [facility];  Service: General;  Laterality: Left;  . INGUINAL HERNIA REPAIR Left   . INSERTION OF MESH Left 02/05/2013   Procedure: INSERTION OF MESH;  Surgeon: Joyice Faster. Cornett, MD;  Location: [facility];  Service: General;  Laterality: Left;  . SHOULDER ARTHROSCOPY WITH ROTATOR CUFF REPAIR AND SUBACROMIAL DECOMPRESSION Left 05/01/2014   Procedure: LEFT ARTHROSCOPY SHOULDER SUBACROMIAL DECOMPRESSION,DISTAL CLAVICAL RESECTION AND ROTATOR CUFF REPAIR;  Surgeon: Marin Shutter, MD;  Location: [facility];  Service: Orthopedics;  Laterality: Left;  . TESTICLAR CYST EXCISION Left   . TONSILLECTOMY    . VASECTOMY     Past Medical History:  Diagnosis Date  . Arthritis    DDD- Lower Back  . Bradycardia   . CAP (community acquired pneumonia) 03/17/2015   "THAT'S WHAT THEY ARE THINKING; THEY ARE NOT SURE"  . Carotid artery occlusion   . Chronic lower back pain    DDD  . CKD (chronic kidney disease)    Dr. Corliss Parish  . Diverticulosis   . Enlarged prostate    takes Flomax daily  . History of blood transfusion    "probably when they did aortic aneurysm"  . History of colon polyps    benign  . Hyperlipidemia   . Hypertension    takes Amlodipine and Zebeta daily  . Insomnia    takes Melatonin nightly  . Joint pain   . 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     . Muscle spasm    takes Zanaflex daily as needed  . Peripheral edema    takes Furosemide daily  . Pneumonia    hx of   . Rheumatic fever 1946  . Rheumatoid arthritis ([place])   . Short-term memory loss    minimal  . Shortness of breath dyspnea    with exertion but  lasts very short period of time  . TIA (transient ischemic attack)    x 2;takes Plavix daily  . Urinary frequency    takes Flomax daily  . Urinary urgency    BP 126/68 (BP Location: Left Arm, Patient Position: Sitting, Cuff Size:  Normal)   Pulse (!) 53   Resp 14   Ht 5\' 7"  (1.702 m)   Wt 177 lb (80.3 kg)   SpO2 97%   BMI 27.72 kg/m   Opioid Risk Score:   Fall Risk Score:  `1  Depression screen PHQ 2/9  Depression screen [facility] 2/9 10/05/2017 09/25/2017 07/03/2017 09/08/2016 10/20/2015 07/02/2015 03/26/2015  Decreased Interest 0 0 0 0 0 0 0  Down, Depressed, Hopeless 0 0 0 0 0 0 0  PHQ - 2 Score 0 0 0 0 0 0 0  Some recent data might be hidden    Review of Systems  Constitutional: Positive for appetite change.  HENT: Negative.   Eyes: Negative.   Respiratory: Positive for cough.   Cardiovascular: Negative.   Gastrointestinal: Negative.   Endocrine: Negative.   Genitourinary: Positive for difficulty urinating.  Musculoskeletal: Positive for arthralgias, back pain and gait problem.  Allergic/Immunologic: Negative.   Neurological: Positive for dizziness, tremors and weakness.  Hematological: Bruises/bleeds easily.  Psychiatric/Behavioral: Negative.   All other systems reviewed and are negative.      Objective:   Physical Exam  Constitutional: He is oriented to person, place, and time. He appears well-developed and well-nourished.  HENT:  Head: Normocephalic and atraumatic.  Neck: Normal range of motion. Neck supple.  Cardiovascular: Normal rate and regular rhythm.  Pulmonary/Chest: Effort normal and breath sounds normal.  Musculoskeletal:  Normal Muscle Bulk and Muscle Testing Reveals: Upper Extremities: Full ROM and Muscle Strength 5/5 Lumbar Paraspinal Tenderness: L-4-L-5 Lower Extremities: Full ROM and Muscle Strength 5/5 Left Lower Extremity Flexion Produces Pain into Patella Arises from chair with ease Narrow Based Gait  Neurological: He is alert and oriented to  person, place, and time.  Skin: Skin is warm and dry.  Psychiatric: He has a normal mood and affect.  Nursing note and vitals reviewed.         Assessment & Plan:  1. Lumbar Spondylosis/ Lumbar Stenosis: 10/19/2017 Continue current medication regime. Refilled: Oxycodone 7.5/325 mg one tablet twice daily as needed , may take an extra tablet when pain is moderate 10 days out of the month. for #70.10/19/2017 We will continue the opioid monitoring program, this consists of regular clinic visits, examinations, urine drug screen, pill counts as well as use of [place] Controlled Substance Reporting System. 2. ChronicLeftKnee Pain: Orthopedist Following Dr. Paulla Fore. 10/19/2017   69minutes of face to face patient care time was spent during this visit. All questions were encouraged and answered.

## 2017-10-23 ENCOUNTER — Other Ambulatory Visit: Payer: Self-pay | Admitting: Family Medicine

## 2017-11-08 NOTE — Progress Notes (Signed)
Kevin [name] was seen today in the movement disorders clinic for neurologic consultation at the request of Marin Olp, MD.  The consultation is for the evaluation of tremor.  The records that were made available to me were reviewed.  This patient is accompanied in the office by his spouse who supplements the history.  11/10/17 update: Patient is seen today in follow-up for tremor.  He was started on primidone last visit.  He was unable to tolerate the medication even at very low dosages.  He was already on propranolol and had bradycardia, so increasing that was not an option.  We discussed the addition of topiramate.  This was started on August 15, 2017.  He reports that it made his head feel "weird."  Only took it for 4 days and d/c the medication (only was 25 mg).  The records that were made available to me were reviewed since our last visit.    PREVIOUS MEDICATIONS: propranolol 40 mg bid (he takes that for BP per pt); topamax - unable to tolerate even at small dose; primidone - didn't tolerate  ALLERGIES:  No Known Allergies  CURRENT MEDICATIONS:  Outpatient Encounter Medications as of 11/10/2017  Medication Sig  . albuterol (VENTOLIN HFA) 108 (90 Base) MCG/ACT inhaler Inhale 2 puffs into the lungs every 6 (six) hours as needed for wheezing or shortness of breath.  Marland Kitchen amLODipine (NORVASC) 10 MG tablet TAKE 1 TABLET EVERY DAY  . atorvastatin (LIPITOR) 20 MG tablet TAKE 1 TABLET EVERY DAY  . BIOTIN PO Take 1 tablet by mouth daily.  . Cholecalciferol (VITAMIN D-3) 1000 UNITS CAPS Take 1 capsule by mouth daily.  . clopidogrel (PLAVIX) 75 MG tablet TAKE 1 TABLET EVERY DAY  . diazepam (VALIUM) 5 MG tablet Take 0.5-1 tablets (2.5-5 mg total) by mouth every 8 (eight) hours as needed for muscle spasms or sedation.  . fenofibrate 160 MG tablet Take 1 tablet (160 mg total) by mouth daily.  . furosemide (LASIX) 20 MG tablet Take 1 tablet (20 mg total) by mouth every other day.  . gabapentin  (NEURONTIN) 300 MG capsule Take 1 capsule (300 mg total) by mouth 2 (two) times daily.  . Melatonin 3 MG TABS Take 5 mg by mouth at bedtime.   Marland Kitchen oxyCODONE-acetaminophen (PERCOCET) 7.5-325 MG tablet Take 1 tablet by mouth 2 (two) times daily as needed for moderate pain. May take an extra tablet when pain is severe, 10 days only  . propranolol (INDERAL) 40 MG tablet Take 1 tablet (40 mg total) by mouth 2 (two) times daily.  . tamsulosin (FLOMAX) 0.4 MG CAPS capsule Take 1 capsule (0.4 mg total) by mouth daily. (Patient taking differently: Take 0.4 mg by mouth every evening. )  . tiZANidine (ZANAFLEX) 4 MG tablet TAKE 1 TABLET BY MOUTH  EVERY 6 HOURS AS NEEDED FOR MUSCLE SPASMS  . triamcinolone cream (KENALOG) 0.1 % APPLY 1 APPLICATION 2 TIMES DAILY TOPICALLY FOR 7-10 DAYS  . vitamin B-12 (CYANOCOBALAMIN) 1000 MCG tablet Take 1,000 mcg by mouth daily.   No facility-administered encounter medications on file as of 11/10/2017.     PAST MEDICAL HISTORY:   Past Medical History:  Diagnosis Date  . Arthritis    DDD- Lower Back  . Bradycardia   . CAP (community acquired pneumonia) 03/17/2015   "THAT'S WHAT THEY ARE THINKING; THEY ARE NOT SURE"  . Carotid artery occlusion   . Chronic lower back pain    DDD  . CKD (chronic  kidney disease)    Dr. Corliss Parish  . Diverticulosis   . Enlarged prostate    takes Flomax daily  . History of blood transfusion    "probably when they did aortic aneurysm"  . History of colon polyps    benign  . Hyperlipidemia   . Hypertension    takes Amlodipine and Zebeta daily  . Insomnia    takes Melatonin nightly  . Joint pain   . 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     . Muscle spasm    takes Zanaflex daily as needed  . Peripheral edema    takes Furosemide daily  . Pneumonia    hx of   . Rheumatic fever 1946  . Rheumatoid arthritis ([place])   . Short-term memory loss    minimal  . Shortness of breath dyspnea    with  exertion but lasts very short period of time  . TIA (transient ischemic attack)    x 2;takes Plavix daily  . Urinary frequency    takes Flomax daily  . Urinary urgency     PAST SURGICAL HISTORY:   Past Surgical History:  Procedure Laterality Date  . ABDOMINAL AORTIC ANEURYSM REPAIR  2010  . CAROTID ENDARTERECTOMY Left 09/20/2004  . CATARACT EXTRACTION, BILATERAL     2016-2017  . COLONOSCOPY    . ENDARTERECTOMY Right 10/12/2015   Procedure: RIGHT CAROTID ENDARTERECTOMY WITH PATCH ANGIOPLASTY;  Surgeon: Elam Dutch, MD;  Location: [facility];  Service: Vascular;  Laterality: Right;  . INGUINAL HERNIA REPAIR Left 02/05/2013   Procedure: HERNIA REPAIR INGUINAL ADULT;  Surgeon: Joyice Faster. Cornett, MD;  Location: [facility];  Service: General;  Laterality: Left;  . INGUINAL HERNIA REPAIR Left   . INSERTION OF MESH Left 02/05/2013   Procedure: INSERTION OF MESH;  Surgeon: Joyice Faster. Cornett, MD;  Location: [facility];  Service: General;  Laterality: Left;  . SHOULDER ARTHROSCOPY WITH ROTATOR CUFF REPAIR AND SUBACROMIAL DECOMPRESSION Left 05/01/2014   Procedure: LEFT ARTHROSCOPY SHOULDER SUBACROMIAL DECOMPRESSION,DISTAL CLAVICAL RESECTION AND ROTATOR CUFF REPAIR;  Surgeon: Marin Shutter, MD;  Location: [facility];  Service: Orthopedics;  Laterality: Left;  . TESTICLAR CYST EXCISION Left   . TONSILLECTOMY    . VASECTOMY      SOCIAL HISTORY:   Social History   Socioeconomic History  . Marital status: Married    Spouse name: Not on file  . Number of children: Not on file  . Years of education: Not on file  . Highest education level: Not on file  Occupational History  . Occupation: retired    Comment: maintenance; electrician  Social Needs  . Financial resource strain: Not on file  . Food insecurity:    Worry: Not on file    Inability: Not on file  . Transportation needs:    Medical: Not on file    Non-medical: Not on file  Tobacco Use  . Smoking status: Former  Smoker    Packs/day: 2.00    Years: 28.00    Pack years: 56.00    Types: Cigarettes    Last attempt to quit: 05/31/1987    Years since quitting: 30.4  . Smokeless tobacco: Never Used  . Tobacco comment: quit smoking "in my 61's"  Substance and Sexual Activity  . Alcohol use: Yes    Alcohol/week: 0.0 oz    Comment: rare, mikes hard lemonade  . Drug use: No    Types: Marijuana  Comment: hx of-4-5 yrs ago  . Sexual activity: Not Currently  Lifestyle  . Physical activity:    Days per week: Not on file    Minutes per session: Not on file  . Stress: Not on file  Relationships  . Social connections:    Talks on phone: Not on file    Gets together: Not on file    Attends religious service: Not on file    Active member of club or organization: Not on file    Attends meetings of clubs or organizations: Not on file    Relationship status: Not on file  . Intimate partner violence:    Fear of current or ex partner: Not on file    Emotionally abused: Not on file    Physically abused: Not on file    Forced sexual activity: Not on file  Other Topics Concern  . Not on file  Social History Narrative   Married 33 years in 2015. Lived together for 12 years. 2 boys from first wife. 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       Hobbies: yardwork, [place] barn, woodwork, Orthoptist    FAMILY HISTORY:   Family Status  Relation Name Status  . Father  Deceased at age 79's  . Mother  Deceased at age 41s  . Son x2 Alive    ROS: Review of Systems  Constitutional: Negative.   HENT: Negative.   Eyes: Negative.   Respiratory: Negative.   Cardiovascular: Negative.   Musculoskeletal: Positive for back pain.  Skin: Negative.   Psychiatric/Behavioral:       Easily irritated    PHYSICAL EXAMINATION:    VITALS:   Vitals:   11/10/17 0810  BP: 100/68  Pulse: (!) 54    SpO2: 90%  Weight: 181 lb (82.1 kg)  Height: 5\' 7"  (1.702 m)    GEN:  The patient appears stated age and is in NAD. HEENT:  Normocephalic, atraumatic.  The mucous membranes are moist. The superficial temporal arteries are without ropiness or tenderness. CV:  Bradycardia.  Regular.   Lungs:  CTAB Neck/HEME:  There are no carotid bruits bilaterally.  Neurological examination:  Orientation: The patient is alert and oriented x3. Cranial nerves: There is good facial symmetry. The speech is fluent and clear. Soft palate rises symmetrically and there is no tongue deviation. Hearing is intact to conversational tone. Sensation: Sensation is intact to light touch throughout Motor: Strength is 5/5 in the bilateral upper and lower extremities.   Shoulder shrug is equal and symmetric.  There is no pronator drift.  Movement examination: Tone: There is normal tone in the UE/LE Abnormal movements: There is severe tremor in the LUE.  There is mod to severe tremor in the RUE.  He is unable to get the pen on the paper order to draw Archimedes spirals on the left Coordination:  There is no decremation with RAM's, with any form of RAMS, including alternating supination and pronation of the forearm, hand opening and closing, finger taps, heel taps and toe taps.  There is severe tremor that interferes with these tasks Gait and Station: The patient has no difficulty arising out of a deep-seated chair without the use of the hands. The patient's stride length is normal but he slightly drags the left leg.     Lab Results  Component Value Date   TSH 0.96 04/17/2017     Chemistry      Component Value Date/Time   NA 136 09/05/2017 1705   NA 136 (A) 01/27/2015   K 4.2 09/05/2017 1705   CL 100 09/05/2017 1705   CO2 29 09/05/2017 1705   BUN 27 (H) 09/05/2017 1705   BUN 32 (A) 01/27/2015   CREATININE 1.97 (H) 09/05/2017 1705   GLU 105 01/27/2015      Component Value Date/Time   CALCIUM 8.9 09/05/2017 1705    ALKPHOS 29 (L) 09/05/2017 1705   AST 16 09/05/2017 1705   ALT 16 09/05/2017 1705   BILITOT 0.7 09/05/2017 1705       ASSESSMENT/PLAN:  1.  Essential Tremor.  -I had a long discussion with patient today.  We discussed the fact that there are really no other medications that he can try that will be effective for treatment, given the degree of tremor that he has we discussed both the brain stimulation as well as focused ultrasound.  We discussed the differences, risks and benefits of each.  Ultimately, he decided that he did not want to pursue these options.  -discussed weighted gloves.  He is going to try to see if this will help.  2.  History of stroke in 2013  -Records were reviewed.  MRI was confirmative.  Patient now on Plavix (was on aspirin prior to the event).  Pt on lipitor.  3.  Significant diffuse hyperreflexia.    -May be due to his history of stroke, but abnormality in the cervical spine cannot be ruled out.  The patient is very clear in saying that no matter what would be found in his neck (or back), he would not have surgery.  Therefore, we opted not to scan him.  4.  F/u prn  Cc:  Marin Olp, MD

## 2017-11-10 ENCOUNTER — Encounter: Payer: Self-pay | Admitting: Neurology

## 2017-11-10 ENCOUNTER — Ambulatory Visit: Payer: Medicare HMO | Admitting: Neurology

## 2017-11-10 VITALS — BP 100/68 | HR 54 | Ht 67.0 in | Wt 181.0 lb

## 2017-11-10 DIAGNOSIS — G250 Essential tremor: Secondary | ICD-10-CM

## 2017-11-20 ENCOUNTER — Encounter: Payer: Self-pay | Admitting: Registered Nurse

## 2017-11-20 ENCOUNTER — Encounter: Payer: Medicare HMO | Attending: Physical Medicine & Rehabilitation | Admitting: Registered Nurse

## 2017-11-20 VITALS — BP 150/63 | HR 58 | Resp 14 | Ht 67.0 in | Wt 182.0 lb

## 2017-11-20 DIAGNOSIS — M47816 Spondylosis without myelopathy or radiculopathy, lumbar region: Secondary | ICD-10-CM | POA: Diagnosis not present

## 2017-11-20 DIAGNOSIS — M1712 Unilateral primary osteoarthritis, left knee: Secondary | ICD-10-CM | POA: Diagnosis not present

## 2017-11-20 DIAGNOSIS — I739 Peripheral vascular disease, unspecified: Secondary | ICD-10-CM | POA: Diagnosis not present

## 2017-11-20 DIAGNOSIS — G894 Chronic pain syndrome: Secondary | ICD-10-CM | POA: Insufficient documentation

## 2017-11-20 DIAGNOSIS — M25551 Pain in right hip: Secondary | ICD-10-CM | POA: Diagnosis not present

## 2017-11-20 DIAGNOSIS — G8929 Other chronic pain: Secondary | ICD-10-CM

## 2017-11-20 DIAGNOSIS — Z5181 Encounter for therapeutic drug level monitoring: Secondary | ICD-10-CM | POA: Diagnosis not present

## 2017-11-20 DIAGNOSIS — Z79899 Other long term (current) drug therapy: Secondary | ICD-10-CM | POA: Diagnosis not present

## 2017-11-20 DIAGNOSIS — M25562 Pain in left knee: Secondary | ICD-10-CM | POA: Diagnosis not present

## 2017-11-20 MED ORDER — OXYCODONE-ACETAMINOPHEN 7.5-325 MG PO TABS: 1.0000 | ORAL_TABLET | Freq: Two times a day (BID) | ORAL | 0 refills | Status: DC | PRN

## 2017-11-20 NOTE — Progress Notes (Signed)
Subjective:    Patient ID: Kevin [name], male    DOB: 07-25-1940, 77 y.o.   MRN: [id number]  HPI: Kevin [name] is a 77 year old male who returns for follow up appointment for chronic pain and medication refill. He states his pain is located in his  lower back mainly right side. He rates his pain 6. His current exercise regime is walking. Also reports a week ago while he was at [facility] he was lifting up water and twisted wrong, he didn't seek medical attention. States he has been resting and the pain is resolving.   Kevin [name] Morphine Equivalent is 31.50 MME. He is also prescribed Diazepam  by Dr. [name] .We have discussed the black box warning of using opioids and benzodiazepines. I highlighted the dangers of using these drugs together and discussed the adverse events including respiratory suppression, overdose, cognitive impairment and importance of compliance with current regimen. We will continue to monitor and adjust as indicated.    Pain Inventory Average Pain 6 Pain Right Now 6 My pain is aching  In the last 24 hours, has pain interfered with the following? General activity 8 Relation with others 8 Enjoyment of life 10 What TIME of day is your pain at its worst? all Sleep (in general) Good  Pain is worse with: walking, bending, sitting and standing Pain improves with: medication Relief from Meds: 6  Mobility walk with assistance use a cane how many minutes can you walk? 15 do you drive?  yes  Function retired  Neuro/Psych weakness tremor trouble walking dizziness  Prior Studies Any changes since last visit?  no  Physicians involved in your care Any changes since last visit?  no   Family History  Problem Relation Age of Onset  . Parkinsonism Father   . Dementia Father   . Cancer Father        Throat  . Colon cancer Mother        died in 6s  . Cancer Mother        Colon   Social History   Socioeconomic History  . Marital status: Married    Spouse  name: Not on file  . Number of children: Not on file  . Years of education: Not on file  . Highest education level: Not on file  Occupational History  . Occupation: retired    Comment: maintenance; electrician  Social Needs  . Financial resource strain: Not on file  . Food insecurity:    Worry: Not on file    Inability: Not on file  . Transportation needs:    Medical: Not on file    Non-medical: Not on file  Tobacco Use  . Smoking status: Former Smoker    Packs/day: 2.00    Years: 28.00    Pack years: 56.00    Types: Cigarettes    Last attempt to quit: 05/31/1987    Years since quitting: 30.4  . Smokeless tobacco: Never Used  . Tobacco comment: quit smoking "in my 53's"  Substance and Sexual Activity  . Alcohol use: Yes    Alcohol/week: 0.0 oz    Comment: rare, mikes hard lemonade  . Drug use: No    Types: Marijuana    Comment: hx of-4-5 yrs ago  . Sexual activity: Not Currently  Lifestyle  . Physical activity:    Days per week: Not on file    Minutes per session: Not on file  . Stress: Not on file  Relationships  .  Social connections:    Talks on phone: Not on file    Gets together: Not on file    Attends religious service: Not on file    Active member of club or organization: Not on file    Attends meetings of clubs or organizations: Not on file    Relationship status: Not on file  Other Topics Concern  . Not on file  Social History Narrative   Married 33 years in 2015. Lived together for 12 years. 2 boys from first wife. 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       Hobbies: yardwork, [place] barn, woodwork, Orthoptist   Past Surgical History:  Procedure Laterality Date  . ABDOMINAL AORTIC ANEURYSM REPAIR  2010  . CAROTID ENDARTERECTOMY Left 09/20/2004  . CATARACT EXTRACTION, BILATERAL     2016-2017  . COLONOSCOPY    . ENDARTERECTOMY  Right 10/12/2015   Procedure: RIGHT CAROTID ENDARTERECTOMY WITH PATCH ANGIOPLASTY;  Surgeon: Elam Dutch, MD;  Location: [facility];  Service: Vascular;  Laterality: Right;  . INGUINAL HERNIA REPAIR Left 02/05/2013   Procedure: HERNIA REPAIR INGUINAL ADULT;  Surgeon: Joyice Faster. Cornett, MD;  Location: [facility];  Service: General;  Laterality: Left;  . INGUINAL HERNIA REPAIR Left   . INSERTION OF MESH Left 02/05/2013   Procedure: INSERTION OF MESH;  Surgeon: Joyice Faster. Cornett, MD;  Location: [facility];  Service: General;  Laterality: Left;  . SHOULDER ARTHROSCOPY WITH ROTATOR CUFF REPAIR AND SUBACROMIAL DECOMPRESSION Left 05/01/2014   Procedure: LEFT ARTHROSCOPY SHOULDER SUBACROMIAL DECOMPRESSION,DISTAL CLAVICAL RESECTION AND ROTATOR CUFF REPAIR;  Surgeon: Marin Shutter, MD;  Location: [facility];  Service: Orthopedics;  Laterality: Left;  . TESTICLAR CYST EXCISION Left   . TONSILLECTOMY    . VASECTOMY     Past Medical History:  Diagnosis Date  . Arthritis    DDD- Lower Back  . Bradycardia   . CAP (community acquired pneumonia) 03/17/2015   "THAT'S WHAT THEY ARE THINKING; THEY ARE NOT SURE"  . Carotid artery occlusion   . Chronic lower back pain    DDD  . CKD (chronic kidney disease)    Dr. Corliss Parish  . Diverticulosis   . Enlarged prostate    takes Flomax daily  . History of blood transfusion    "probably when they did aortic aneurysm"  . History of colon polyps    benign  . Hyperlipidemia   . Hypertension    takes Amlodipine and Zebeta daily  . Insomnia    takes Melatonin nightly  . Joint pain   . 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     . Muscle spasm    takes Zanaflex daily as needed  . Peripheral edema    takes Furosemide daily  . Pneumonia    hx of   . Rheumatic fever 1946  . Rheumatoid arthritis ([place])   . Short-term memory loss    minimal  . Shortness of breath dyspnea    with exertion but lasts very short  period of time  . TIA (transient ischemic attack)    x 2;takes Plavix daily  . Urinary frequency    takes Flomax daily  . Urinary urgency    BP (!) 150/63 (BP Location: Right Arm, Patient Position: Sitting, Cuff Size: Normal)   Pulse Marland Kitchen)  52   Resp 14   Ht 5\' 7"  (1.702 m)   Wt 182 lb (82.6 kg)   SpO2 96%   BMI 28.51 kg/m   Opioid Risk Score:   Fall Risk Score:  `1  Depression screen PHQ 2/9  Depression screen [facility] 2/9 10/19/2017 10/05/2017 09/25/2017 07/03/2017 09/08/2016 10/20/2015 07/02/2015  Decreased Interest 0 0 0 0 0 0 0  Down, Depressed, Hopeless 0 0 0 0 0 0 0  PHQ - 2 Score 0 0 0 0 0 0 0  Some recent data might be hidden   Review of Systems  Constitutional: Negative.   HENT: Negative.   Eyes: Negative.   Respiratory: Negative.   Gastrointestinal: Negative.   Endocrine: Negative.   Genitourinary: Negative.   Musculoskeletal: Positive for back pain.  Skin: Negative.   Allergic/Immunologic: Negative.   Neurological: Positive for dizziness, tremors and weakness.  Hematological: Bruises/bleeds easily.  Psychiatric/Behavioral: Negative.        Objective:   Physical Exam  Constitutional: He is oriented to person, place, and time. He appears well-developed and well-nourished.  HENT:  Head: Normocephalic and atraumatic.  Neck: Normal range of motion. Neck supple.  Cardiovascular: Normal rate and regular rhythm.  Pulmonary/Chest: Effort normal and breath sounds normal.  Musculoskeletal:  Normal Muscle Bulk and Muscle Testing Reveals: Upper Extremities: Decreased ROM 90 Degrees and Muscle Strength 4/5 Lumbar Paraspinal Tenderness: L-4-L-5 Mainly Right Side Lower Extremities: Full ROM and Muscle Strength 5/5 Arises from chair slowly using cane for support Narrow Based Gait   Neurological: He is alert and oriented to person, place, and time.  Skin: Skin is warm and dry.  Psychiatric: He has a normal mood and affect.  Nursing note and vitals reviewed.           Assessment & Plan:  1. Lumbar Spondylosis/ Lumbar Stenosis: 11/20/2017 Continue current medication regime. Refilled:Oxycodone 7.5/325 mg one tablet twice daily as needed, may take an extra tablet when pain is moderate 10 days out of the month.for #70.11/20/2017 We will continue the opioid monitoring program, this consists of regular clinic visits, examinations, urine drug screen, pill counts as well as use of [place] Controlled Substance Reporting System. 2. ChronicLeftKnee Pain: Orthopedist Following Dr. Paulla Fore. 11/20/2017   26minutes of face to face patient care time was spent during this visit. All questions were encouraged and answered.  F/U in 1 month

## 2017-12-22 ENCOUNTER — Encounter: Payer: Self-pay | Admitting: Family Medicine

## 2017-12-22 ENCOUNTER — Encounter: Payer: Self-pay | Admitting: Registered Nurse

## 2017-12-22 ENCOUNTER — Ambulatory Visit (INDEPENDENT_AMBULATORY_CARE_PROVIDER_SITE_OTHER): Payer: Medicare HMO | Admitting: Family Medicine

## 2017-12-22 ENCOUNTER — Encounter: Payer: Medicare HMO | Attending: Physical Medicine & Rehabilitation | Admitting: Registered Nurse

## 2017-12-22 VITALS — BP 118/76 | HR 54 | Temp 97.8°F | Ht 67.0 in | Wt 180.8 lb

## 2017-12-22 VITALS — BP 131/64 | HR 58 | Ht 67.0 in | Wt 181.0 lb

## 2017-12-22 DIAGNOSIS — M25562 Pain in left knee: Secondary | ICD-10-CM

## 2017-12-22 DIAGNOSIS — G8929 Other chronic pain: Secondary | ICD-10-CM

## 2017-12-22 DIAGNOSIS — Z5181 Encounter for therapeutic drug level monitoring: Secondary | ICD-10-CM

## 2017-12-22 DIAGNOSIS — G894 Chronic pain syndrome: Secondary | ICD-10-CM

## 2017-12-22 DIAGNOSIS — H5711 Ocular pain, right eye: Secondary | ICD-10-CM

## 2017-12-22 DIAGNOSIS — M1712 Unilateral primary osteoarthritis, left knee: Secondary | ICD-10-CM | POA: Diagnosis not present

## 2017-12-22 DIAGNOSIS — Z79899 Other long term (current) drug therapy: Secondary | ICD-10-CM

## 2017-12-22 DIAGNOSIS — I739 Peripheral vascular disease, unspecified: Secondary | ICD-10-CM | POA: Diagnosis not present

## 2017-12-22 DIAGNOSIS — M25551 Pain in right hip: Secondary | ICD-10-CM | POA: Insufficient documentation

## 2017-12-22 DIAGNOSIS — M47816 Spondylosis without myelopathy or radiculopathy, lumbar region: Secondary | ICD-10-CM | POA: Diagnosis not present

## 2017-12-22 DIAGNOSIS — M25561 Pain in right knee: Secondary | ICD-10-CM

## 2017-12-22 MED ORDER — DIAZEPAM 5 MG PO TABS: 2.5000 mg | ORAL_TABLET | Freq: Three times a day (TID) | ORAL | 1 refills | Status: DC | PRN

## 2017-12-22 MED ORDER — OXYCODONE-ACETAMINOPHEN 7.5-325 MG PO TABS: 1.0000 | ORAL_TABLET | Freq: Two times a day (BID) | ORAL | 0 refills | Status: DC | PRN

## 2017-12-22 NOTE — Progress Notes (Signed)
Subjective:  Kevin [name] is a 77 y.o. year old very pleasant male patient who presents for/with See problem oriented charting ROS- continued issues with back and knee pain. No recent falls. No chest pain reported. No calf or thigh pain with walking    Past Medical History-  Patient Active Problem List   Diagnosis Date Noted  . PAD (peripheral artery disease) ([place]) 01/08/2016    Priority: High  . Diastolic CHF ([place]) 56/43/3295    Priority: High  . CKD (chronic kidney disease), stage III ([place]) 01/27/2014    Priority: High  . AAA (abdominal aortic aneurysm, ruptured) ([place]) 01/17/2013    Priority: High  . Carotid artery stenosis s/p L carotid endarterectomy 01/12/2012    Priority: High  . History of CVA (cerebrovascular accident) 11/17/2011    Priority: High  . Chronic pain syndrome 12/23/2009    Priority: High  . Constipation 09/19/2017    Priority: Medium  . Personal history of colonic polyps 05/15/2013    Priority: Medium  . Essential tremor 10/05/2009    Priority: Medium  . UNSPECIFIED ANEMIA 12/05/2008    Priority: Medium  . BPH (benign prostatic hyperplasia) 06/04/2007    Priority: Medium  . Fasting hyperglycemia 02/05/2007    Priority: Medium  . Hyperlipidemia 01/03/2007    Priority: Medium  . Essential hypertension 01/03/2007    Priority: Medium  . CAP (community acquired pneumonia) 03/17/2015    Priority: Low  . Family history of malignant neoplasm of gastrointestinal tract 05/15/2013    Priority: Low  . Inguinal hernia 05/01/2012    Priority: Low  . BURSITIS, HIP 12/21/2009    Priority: Low  . ACTINIC KERATOSIS, EAR, LEFT 03/09/2009    Priority: Low  . Primary osteoarthritis of left knee 11/02/2007    Priority: Low  . 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 Lumbar spondylosis 03/17/2017    Medications- reviewed and updated Current Outpatient Medications  Medication Sig Dispense Refill  . albuterol (VENTOLIN HFA) 108 (90 Base)  MCG/ACT inhaler Inhale 2 puffs into the lungs every 6 (six) hours as needed for wheezing or shortness of breath.    Marland Kitchen amLODipine (NORVASC) 10 MG tablet TAKE 1 TABLET EVERY DAY 90 tablet 1  . atorvastatin (LIPITOR) 20 MG tablet TAKE 1 TABLET EVERY DAY 90 tablet 1  . BIOTIN PO Take 1 tablet by mouth daily.    . Cholecalciferol (VITAMIN D-3) 1000 UNITS CAPS Take 1 capsule by mouth daily.    . clopidogrel (PLAVIX) 75 MG tablet TAKE 1 TABLET EVERY DAY 90 tablet 1  . diazepam (VALIUM) 5 MG tablet Take 0.5-1 tablets (2.5-5 mg total) by mouth every 8 (eight) hours as needed for muscle spasms or sedation. 90 tablet 1  . fenofibrate 160 MG tablet Take 1 tablet (160 mg total) by mouth daily. 90 tablet 3  . furosemide (LASIX) 20 MG tablet Take 1 tablet (20 mg total) by mouth every other day. 90 tablet 1  . gabapentin (NEURONTIN) 300 MG capsule Take 1 capsule (300 mg total) by mouth 2 (two) times daily. 180 capsule 1  . Melatonin 3 MG TABS Take 5 mg by mouth at bedtime.     Marland Kitchen oxyCODONE-acetaminophen (PERCOCET) 7.5-325 MG tablet Take 1 tablet by mouth 2 (two) times daily as needed for moderate pain. May take an extra tablet when pain is severe, 10 days only 70 tablet 0  . propranolol (INDERAL) 40 MG tablet Take 1 tablet (40 mg total) by  mouth 2 (two) times daily. 180 tablet 3  . tamsulosin (FLOMAX) 0.4 MG CAPS capsule Take 1 capsule (0.4 mg total) by mouth daily. (Patient taking differently: Take 0.4 mg by mouth every evening. ) 90 capsule 3  . tiZANidine (ZANAFLEX) 4 MG tablet TAKE 1 TABLET BY MOUTH  EVERY 6 HOURS AS NEEDED FOR MUSCLE SPASMS 90 tablet 1  . triamcinolone cream (KENALOG) 0.1 % APPLY 1 APPLICATION 2 TIMES DAILY TOPICALLY FOR 7-10 DAYS 80 g 0  . vitamin B-12 (CYANOCOBALAMIN) 1000 MCG tablet Take 1,000 mcg by mouth daily.     No current facility-administered medications for this visit.     Objective: BP 118/76 (BP Location: Left Arm, Patient Position: Sitting, Cuff Size: Large)   Pulse (!) 54    Temp 97.8 F (36.6 C) (Oral)   Ht 5\' 7"  (1.702 m)   Wt 180 lb 12.8 oz (82 kg)   SpO2 95%   BMI 28.32 kg/m  Gen: NAD, resting comfortably CV: RRR no murmurs rubs or gallops Lungs: CTAB no crackles, wheeze, rhonchi Ext: no edema Skin: warm, dry 1+ pulses left foot and right PT, 2+ right DP. Numbness reproduced with straight leg raise  Assessment/Plan:  Right eye pain S: also having issues with his right eye- with controlled slow eye movement there is no issue- if he randomly turns head to right (with quick movement in particular) notes an agonizing pressure behind the right eye. Feels liek a squeezing sensation. Has not yet seen eye doctor A/P: no obvious cause on exam (I cannot reproduce the sensation today)- we will refer him back to his optho- he agrees to call to schedule. Seek care immediately for worsening symptoms    Chronic pain syndrome S:  having numbness in bilateral toes- happens about twice a day. Can last for an hour or two. Goes away on its own- hasnt found a particular remedy that helps. Can happen in any position from walking, to sitting to laying- seems most likely with seated position or seated with legs propped up. Not much pain in those areas.   Patient with history PAD and follows with Dr. Oneida Alar- last testing of ABI appears to be 2016 though  A/P: Patient with reproducible sensation with straight leg raise form seated position for each leg- we discussed this is likely related to positioning. Could easily be related to DDD/nerve issues in lumbar spine. He is going to try to avoid this position- he feels relieved after our discussion today  To be on safe side-  Still advised him to follow up with vascular and ask about updated ABIs. I can feel pulses on each foot but not as strong on left.   PAD (peripheral artery disease) ([place]) See below section on chronic pain. I advised patient to follow up with Dr. Oneida Alar for repeat ABIs- these havent been done in a few years  and he is overdue for his carotid check    Future Appointments  Date Time Provider [place]  01/22/2018  9:30 AM Kirsteins, Luanna Salk, MD [id number] None  10/11/2018  2:00 PM [facility] ordered this encounter  . diazepam (VALIUM) 5 MG tablet    Sig: Take 0.5-1 tablets (2.5-5 mg total) by mouth every 8 (eight) hours as needed for muscle spasms or sedation.    Dispense:  90 tablet    Refill:  1    Return precautions advised.  Garret Reddish, MD

## 2017-12-22 NOTE — Assessment & Plan Note (Signed)
See below section on chronic pain. I advised patient to follow up with Dr. Oneida Alar for repeat ABIs- these havent been done in a few years and he is overdue for his carotid check

## 2017-12-22 NOTE — Patient Instructions (Addendum)
Call Dr. Oneida Alar office for follow up. This is to check carotids but I would also mention the leg issues and see if they can update your leg scan.   I really think this is position/coming from the back as we replicated it with lifting your legs up but you had reasonable pulses and no change in the color to your toes to suggest vascular issue  Please call your ophthalmologist for follow up

## 2017-12-22 NOTE — Assessment & Plan Note (Addendum)
S:  having numbness in bilateral toes- happens about twice a day. Can last for an hour or two. Goes away on its own- hasnt found a particular remedy that helps. Can happen in any position from walking, to sitting to laying- seems most likely with seated position or seated with legs propped up. Not much pain in those areas.   Patient with history PAD and follows with Dr. Oneida Alar- last testing of ABI appears to be 2016 though  A/P: Patient with reproducible sensation with straight leg raise form seated position for each leg- we discussed this is likely related to positioning. Could easily be related to DDD/nerve issues in lumbar spine. He is going to try to avoid this position- he feels relieved after our discussion today  To be on safe side-  Still advised him to follow up with vascular and ask about updated ABIs. I can feel pulses on each foot but not as strong on left.

## 2017-12-22 NOTE — Progress Notes (Signed)
Subjective:    Patient ID: ARSEN MANGIONE, male    DOB: 31-Jul-1940, 77 y.o.   MRN: [id number]  HPI: Ms. Kevin [name] is a 77 year old male who returns for follow up appointment for chronic pain and medication refill. He states his pain is located in his lower back and bilateral knees. He rates his pain 4. His current excercise regime is walking and performing outside light chores.   Mr. Bail reports a few days ago he tried the CBD oil sublingual to assist with insomnia, he was instructed not to use until this provider speaks with Dr. Letta Pate. He verbalizes understanding.   Mr. Rickman Morphine Equivalent is 26.25 MME. He is also prescribed Diazepam by Dr. [name] .We have discussed the black box warning of using opioids and benzodiazepines. I highlighted the dangers of using these drugs together and discussed the adverse events including respiratory suppression, overdose, cognitive impairment and importance of compliance with current regimen. We will continue to monitor and adjust as indicated.   Last Oral Swab was Performed on 06/16/2017, it was consistent. Oral Swab was Performed today.   Pain Inventory Average Pain 6 Pain Right Now 4 My pain is constant and aching  In the last 24 hours, has pain interfered with the following? General activity 9 Relation with others 3 Enjoyment of life 8 What TIME of day is your pain at its worst? daytime Sleep (in general) Fair  Pain is worse with: walking, bending, sitting, standing and some activites Pain improves with: medication Relief from Meds: 6  Mobility walk without assistance walk with assistance use a cane ability to climb steps?  yes do you drive?  yes  Function Do you have any goals in this area?  no  Neuro/Psych numbness tremor dizziness  Prior Studies Any changes since last visit?  no  Physicians involved in your care Any changes since last visit?  no   Family History  Problem Relation Age of Onset  . Parkinsonism  Father   . Dementia Father   . Cancer Father        Throat  . Colon cancer Mother        died in 70s  . Cancer Mother        Colon   Social History   Socioeconomic History  . Marital status: Married    Spouse name: Not on file  . Number of children: Not on file  . Years of education: Not on file  . Highest education level: Not on file  Occupational History  . Occupation: retired    Comment: maintenance; electrician  Social Needs  . Financial resource strain: Not on file  . Food insecurity:    Worry: Not on file    Inability: Not on file  . Transportation needs:    Medical: Not on file    Non-medical: Not on file  Tobacco Use  . Smoking status: Former Smoker    Packs/day: 2.00    Years: 28.00    Pack years: 56.00    Types: Cigarettes    Last attempt to quit: 05/31/1987    Years since quitting: 30.5  . Smokeless tobacco: Never Used  . Tobacco comment: quit smoking "in my 22's"  Substance and Sexual Activity  . Alcohol use: Yes    Alcohol/week: 0.0 oz    Comment: rare, mikes hard lemonade  . Drug use: No    Types: Marijuana    Comment: hx of-4-5 yrs ago  . Sexual activity:  Not Currently  Lifestyle  . Physical activity:    Days per week: Not on file    Minutes per session: Not on file  . Stress: Not on file  Relationships  . Social connections:    Talks on phone: Not on file    Gets together: Not on file    Attends religious service: Not on file    Active member of club or organization: Not on file    Attends meetings of clubs or organizations: Not on file    Relationship status: Not on file  Other Topics Concern  . Not on file  Social History Narrative   Married 33 years in 2015. Lived together for 12 years. 2 boys from first wife. 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       Hobbies: yardwork, [place] barn, woodwork, Orthoptist   Past  Surgical History:  Procedure Laterality Date  . ABDOMINAL AORTIC ANEURYSM REPAIR  2010  . CAROTID ENDARTERECTOMY Left 09/20/2004  . CATARACT EXTRACTION, BILATERAL     2016-2017  . COLONOSCOPY    . ENDARTERECTOMY Right 10/12/2015   Procedure: RIGHT CAROTID ENDARTERECTOMY WITH PATCH ANGIOPLASTY;  Surgeon: Elam Dutch, MD;  Location: [place] [facility];  Service: Vascular;  Laterality: Right;  . INGUINAL HERNIA REPAIR Left 02/05/2013   Procedure: HERNIA REPAIR INGUINAL ADULT;  Surgeon: Joyice Faster. Cornett, MD;  Location: [facility];  Service: General;  Laterality: Left;  . INGUINAL HERNIA REPAIR Left   . INSERTION OF MESH Left 02/05/2013   Procedure: INSERTION OF MESH;  Surgeon: Joyice Faster. Cornett, MD;  Location: [place];  Service: General;  Laterality: Left;  . SHOULDER ARTHROSCOPY WITH ROTATOR CUFF REPAIR AND SUBACROMIAL DECOMPRESSION Left 05/01/2014   Procedure: LEFT ARTHROSCOPY SHOULDER SUBACROMIAL DECOMPRESSION,DISTAL CLAVICAL RESECTION AND ROTATOR CUFF REPAIR;  Surgeon: Marin Shutter, MD;  Location: [place];  Service: Orthopedics;  Laterality: Left;  . TESTICLAR CYST EXCISION Left   . TONSILLECTOMY    . VASECTOMY     Past Medical History:  Diagnosis Date  . Arthritis    DDD- Lower Back  . Bradycardia   . CAP (community acquired pneumonia) 03/17/2015   "THAT'S WHAT THEY ARE THINKING; THEY ARE NOT SURE"  . Carotid artery occlusion   . Chronic lower back pain    DDD  . CKD (chronic kidney disease)    Dr. Corliss Parish  . Diverticulosis   . Enlarged prostate    takes Flomax daily  . History of blood transfusion    "probably when they did aortic aneurysm"  . History of colon polyps    benign  . Hyperlipidemia   . Hypertension    takes Amlodipine and Zebeta daily  . Insomnia    takes Melatonin nightly  . Joint pain   . 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     . Muscle spasm    takes Zanaflex daily as needed  . Peripheral edema     takes Furosemide daily  . Pneumonia    hx of   . Rheumatic fever 1946  . Rheumatoid arthritis ([place])   . Short-term memory loss    minimal  . Shortness of breath dyspnea    with exertion but lasts very short period of time  . TIA (transient ischemic attack)    x  2;takes Plavix daily  . Urinary frequency    takes Flomax daily  . Urinary urgency    Ht 5\' 7"  (1.702 m)   Wt 181 lb (82.1 kg)   BMI 28.35 kg/m   Opioid Risk Score:   Fall Risk Score:  `1  Depression screen PHQ 2/9  Depression screen [facility] 2/9 10/19/2017 10/05/2017 09/25/2017 07/03/2017 09/08/2016 10/20/2015 07/02/2015  Decreased Interest 0 0 0 0 0 0 0  Down, Depressed, Hopeless 0 0 0 0 0 0 0  PHQ - 2 Score 0 0 0 0 0 0 0  Some recent data might be hidden     Review of Systems  Constitutional: Negative.   HENT: Negative.   Eyes: Negative.   Respiratory: Positive for wheezing.   Cardiovascular: Negative.   Gastrointestinal: Negative.   Endocrine: Negative.   Genitourinary: Negative.   Musculoskeletal: Positive for arthralgias, back pain, gait problem and myalgias.  Skin: Negative.   Allergic/Immunologic: Negative.   Neurological: Positive for dizziness, tremors and numbness.  Hematological: Bruises/bleeds easily.  Psychiatric/Behavioral: Negative.   All other systems reviewed and are negative.      Objective:   Physical Exam  Constitutional: He is oriented to person, place, and time. He appears well-developed and well-nourished.  HENT:  Head: Normocephalic and atraumatic.  Neck: Normal range of motion. Neck supple.  Cardiovascular: Normal rate and regular rhythm.  Pulmonary/Chest: Effort normal and breath sounds normal.  Musculoskeletal:  Normal Muscle Bulk and Muscle Testing Reveals:  Upper Extremities: Full ROM and Muscle Strength 5/5 Lumbar Paraspinal Tenderness: L-4-L-5 Lower Extremities: Full ROM and Muscle Strength 5/5 Arises from chair with ease Narrow Based Gait   Neurological: He is alert and  oriented to person, place, and time.  Skin: Skin is warm and dry.  Psychiatric: He has a normal mood and affect. His behavior is normal.  Nursing note and vitals reviewed.         Assessment & Plan:  1. Lumbar Spondylosis/ Lumbar Stenosis:12/22/2017 Continue current medication regime. Refilled:Oxycodone 7.5/325 mg one tablet twice daily as needed, may take an extra tablet when pain is moderate 10 days out of the month.for #70.12/22/2017 We will continue the opioid monitoring program, this consists of regular clinic visits, examinations, urine drug screen, pill counts as well as use of [place] Controlled Substance Reporting System. 2. ChronicBilateralKnee Pain: Orthopedist Following Dr. Paulla Fore. 12/22/2017   29minutes of face to face patient care time was spent during this visit. All questions were encouraged and answered.  F/U in 1 month

## 2017-12-26 LAB — DRUG TOX MONITOR 1 W/CONF, ORAL FLD
Alprazolam: NEGATIVE ng/mL (ref <0.50)
Amphetamines: NEGATIVE ng/mL (ref <10)
Barbiturates: NEGATIVE ng/mL (ref <10)
Benzodiazepines: POSITIVE ng/mL — AB (ref <0.50)
Buprenorphine: NEGATIVE ng/mL (ref <0.10)
COCAINE: NEGATIVE ng/mL (ref <5.0)
Chlordiazepoxide: NEGATIVE ng/mL (ref <0.50)
Clonazepam: NEGATIVE ng/mL (ref <0.50)
Codeine: NEGATIVE ng/mL (ref <2.5)
DIAZEPAM: 2.76 ng/mL — AB (ref <0.50)
DIHYDROCODEINE: NEGATIVE ng/mL (ref <2.5)
FENTANYL: NEGATIVE ng/mL (ref <0.10)
FLURAZEPAM: NEGATIVE ng/mL (ref <0.50)
Flunitrazepam: NEGATIVE ng/mL (ref <0.50)
HYDROCODONE: NEGATIVE ng/mL (ref <2.5)
Heroin Metabolite: NEGATIVE ng/mL (ref <1.0)
Hydromorphone: NEGATIVE ng/mL (ref <2.5)
Lorazepam: NEGATIVE ng/mL (ref <0.50)
MARIJUANA: NEGATIVE ng/mL (ref <2.5)
MDMA: NEGATIVE ng/mL (ref <10)
Meprobamate: NEGATIVE ng/mL (ref <2.5)
Methadone: NEGATIVE ng/mL (ref <5.0)
Midazolam: NEGATIVE ng/mL (ref <0.50)
Morphine: NEGATIVE ng/mL (ref <2.5)
NICOTINE METABOLITE: NEGATIVE ng/mL (ref <5.0)
Nordiazepam: 3.38 ng/mL — ABNORMAL HIGH (ref <0.50)
Norhydrocodone: NEGATIVE ng/mL (ref <2.5)
Noroxycodone: 5.9 ng/mL — ABNORMAL HIGH (ref <2.5)
OPIATES: POSITIVE ng/mL — AB (ref <2.5)
OXYCODONE: 138.6 ng/mL — AB (ref <2.5)
OXYMORPHONE: NEGATIVE ng/mL (ref <2.5)
Oxazepam: NEGATIVE ng/mL (ref <0.50)
PHENCYCLIDINE: NEGATIVE ng/mL (ref <10)
TEMAZEPAM: NEGATIVE ng/mL (ref <0.50)
TRIAZOLAM: NEGATIVE ng/mL (ref <0.50)
Tapentadol: NEGATIVE ng/mL (ref <5.0)
Tramadol: NEGATIVE ng/mL (ref <5.0)
ZOLPIDEM: NEGATIVE ng/mL (ref <5.0)

## 2017-12-26 LAB — DRUG TOX ALC METAB W/CON, ORAL FLD: ALCOHOL METABOLITE: NEGATIVE ng/mL (ref <25)

## 2017-12-27 ENCOUNTER — Telehealth: Payer: Self-pay | Admitting: *Deleted

## 2017-12-27 NOTE — Telephone Encounter (Signed)
Oral swab drug screen was consistent for prescribed medications.  ?

## 2018-01-22 ENCOUNTER — Encounter: Payer: Self-pay | Admitting: Physical Medicine & Rehabilitation

## 2018-01-22 ENCOUNTER — Other Ambulatory Visit: Payer: Self-pay

## 2018-01-22 ENCOUNTER — Ambulatory Visit: Payer: Medicare HMO | Admitting: Physical Medicine & Rehabilitation

## 2018-01-22 ENCOUNTER — Encounter: Payer: Medicare HMO | Attending: Physical Medicine & Rehabilitation

## 2018-01-22 VITALS — BP 133/66 | HR 50 | Ht 68.0 in | Wt 180.4 lb

## 2018-01-22 DIAGNOSIS — I739 Peripheral vascular disease, unspecified: Secondary | ICD-10-CM | POA: Insufficient documentation

## 2018-01-22 DIAGNOSIS — M25551 Pain in right hip: Secondary | ICD-10-CM | POA: Insufficient documentation

## 2018-01-22 DIAGNOSIS — G894 Chronic pain syndrome: Secondary | ICD-10-CM | POA: Insufficient documentation

## 2018-01-22 DIAGNOSIS — M48061 Spinal stenosis, lumbar region without neurogenic claudication: Secondary | ICD-10-CM | POA: Diagnosis not present

## 2018-01-22 DIAGNOSIS — M1712 Unilateral primary osteoarthritis, left knee: Secondary | ICD-10-CM | POA: Insufficient documentation

## 2018-01-22 DIAGNOSIS — I69852 Hemiplegia and hemiparesis following other cerebrovascular disease affecting left dominant side: Secondary | ICD-10-CM

## 2018-01-22 DIAGNOSIS — M5416 Radiculopathy, lumbar region: Secondary | ICD-10-CM

## 2018-01-22 DIAGNOSIS — M47816 Spondylosis without myelopathy or radiculopathy, lumbar region: Secondary | ICD-10-CM | POA: Insufficient documentation

## 2018-01-22 MED ORDER — OXYCODONE-ACETAMINOPHEN 7.5-325 MG PO TABS: 1.0000 | ORAL_TABLET | Freq: Two times a day (BID) | ORAL | 0 refills | Status: DC | PRN

## 2018-01-22 NOTE — Progress Notes (Signed)
Subjective:    Patient ID: Kevin [name], male    DOB: 1941/02/05, 77 y.o.   MRN: [id number]  HPI Knee injections per Dr Ileene Rubens, Depomedrol and Marcaine effective  Hx of RIght frontal and occipital infarct 2013  Disequilibrium increased over the last 1-2 yrs, worse first thing in am  Worsening memory per pt (my wife remembers things for me)  Chronic Tremor LUE>LLE diagnosed with essential tremor by neuro Pt feels like onset was ~30yrs ago  Burning pain and numbness  Sleeps in recliner  Tried CBD oil for the last month, occ uses for sleep,  We discussed possibility of positive THC on UDS and discontinuation  Pain Inventory Average Pain 8 Pain Right Now 6 My pain is sharp and aching  In the last 24 hours, has pain interfered with the following? General activity 7 Relation with others 3 Enjoyment of life 9 What TIME of day is your pain at its worst? daytime Sleep (in general) Fair  Pain is worse with: walking, bending and some activites Pain improves with: rest and medication Relief from Meds: 6  Mobility use a cane how many minutes can you walk? 10 ability to climb steps?  yes do you drive?  yes  Function retired I need assistance with the following:  household duties  Neuro/Psych weakness trouble walking dizziness  Prior Studies Any changes since last visit?  no  Physicians involved in your care Any changes since last visit?  no   Family History  Problem Relation Age of Onset  . Parkinsonism Father   . Dementia Father   . Cancer Father        Throat  . Colon cancer Mother        died in 80s  . Cancer Mother        Colon   Social History   Socioeconomic History  . Marital status: Married    Spouse name: Not on file  . Number of children: Not on file  . Years of education: Not on file  . Highest education level: Not on file  Occupational History  . Occupation: retired    Comment: maintenance; electrician  Social Needs  . Financial  resource strain: Not on file  . Food insecurity:    Worry: Not on file    Inability: Not on file  . Transportation needs:    Medical: Not on file    Non-medical: Not on file  Tobacco Use  . Smoking status: Former Smoker    Packs/day: 2.00    Years: 28.00    Pack years: 56.00    Types: Cigarettes    Last attempt to quit: 05/31/1987    Years since quitting: 30.6  . Smokeless tobacco: Never Used  . Tobacco comment: quit smoking "in my 17's"  Substance and Sexual Activity  . Alcohol use: Yes    Alcohol/week: 0.0 standard drinks    Comment: rare, mikes hard lemonade  . Drug use: No    Types: Marijuana    Comment: hx of-4-5 yrs ago  . Sexual activity: Not Currently  Lifestyle  . Physical activity:    Days per week: Not on file    Minutes per session: Not on file  . Stress: Not on file  Relationships  . Social connections:    Talks on phone: Not on file    Gets together: Not on file    Attends religious service: Not on file    Active member of club or organization: Not on  file    Attends meetings of clubs or organizations: Not on file    Relationship status: Not on file  Other Topics Concern  . Not on file  Social History Narrative   Married 33 years in 2015. Lived together for 12 years. 2 boys from first wife. 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       Hobbies: yardwork, [place] barn, woodwork, Orthoptist   Past Surgical History:  Procedure Laterality Date  . ABDOMINAL AORTIC ANEURYSM REPAIR  2010  . CAROTID ENDARTERECTOMY Left 09/20/2004  . CATARACT EXTRACTION, BILATERAL     2016-2017  . COLONOSCOPY    . ENDARTERECTOMY Right 10/12/2015   Procedure: RIGHT CAROTID ENDARTERECTOMY WITH PATCH ANGIOPLASTY;  Surgeon: Elam Dutch, MD;  Location: [facility];  Service: Vascular;  Laterality: Right;  . INGUINAL HERNIA REPAIR Left 02/05/2013   Procedure: HERNIA  REPAIR INGUINAL ADULT;  Surgeon: Joyice Faster. Cornett, MD;  Location: [facility];  Service: General;  Laterality: Left;  . INGUINAL HERNIA REPAIR Left   . INSERTION OF MESH Left 02/05/2013   Procedure: INSERTION OF MESH;  Surgeon: Joyice Faster. Cornett, MD;  Location: [facility];  Service: General;  Laterality: Left;  . SHOULDER ARTHROSCOPY WITH ROTATOR CUFF REPAIR AND SUBACROMIAL DECOMPRESSION Left 05/01/2014   Procedure: LEFT ARTHROSCOPY SHOULDER SUBACROMIAL DECOMPRESSION,DISTAL CLAVICAL RESECTION AND ROTATOR CUFF REPAIR;  Surgeon: Marin Shutter, MD;  Location: [facility];  Service: Orthopedics;  Laterality: Left;  . TESTICLAR CYST EXCISION Left   . TONSILLECTOMY    . VASECTOMY     Past Medical History:  Diagnosis Date  . Arthritis    DDD- Lower Back  . Bradycardia   . CAP (community acquired pneumonia) 03/17/2015   "THAT'S WHAT THEY ARE THINKING; THEY ARE NOT SURE"  . Carotid artery occlusion   . Chronic lower back pain    DDD  . CKD (chronic kidney disease)    Dr. Corliss Parish  . Diverticulosis   . Enlarged prostate    takes Flomax daily  . History of blood transfusion    "probably when they did aortic aneurysm"  . History of colon polyps    benign  . Hyperlipidemia   . Hypertension    takes Amlodipine and Zebeta daily  . Insomnia    takes Melatonin nightly  . Joint pain   . 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     . Muscle spasm    takes Zanaflex daily as needed  . Peripheral edema    takes Furosemide daily  . Pneumonia    hx of   . Rheumatic fever 1946  . Rheumatoid arthritis ([place])   . Short-term memory loss    minimal  . Shortness of breath dyspnea    with exertion but lasts very short period of time  . TIA (transient ischemic attack)    x 2;takes Plavix daily  . Urinary frequency    takes Flomax daily  . Urinary urgency    BP 133/66   Pulse (!) 50   Ht 5\' 8"  (1.727 m)   Wt 180 lb 6.4 oz (81.8 kg)   SpO2  98%   BMI 27.43 kg/m   Opioid Risk Score:   Fall Risk Score:  `1  Depression screen PHQ 2/9  Depression screen [facility] 2/9 01/22/2018  10/19/2017 10/05/2017 09/25/2017 07/03/2017 09/08/2016 10/20/2015  Decreased Interest 0 0 0 0 0 0 0  Down, Depressed, Hopeless 0 0 0 0 0 0 0  PHQ - 2 Score 0 0 0 0 0 0 0  Some recent data might be hidden   Review of Systems  Constitutional: Negative.   HENT: Negative.   Eyes: Negative.   Respiratory: Positive for cough.   Cardiovascular: Negative.   Gastrointestinal: Negative.   Endocrine: Negative.   Genitourinary: Negative.   Musculoskeletal: Negative.   Skin: Negative.   Allergic/Immunologic: Negative.   Neurological: Negative.   Hematological: Negative.   Psychiatric/Behavioral: Negative.   All other systems reviewed and are negative.      Objective:   Physical Exam  Neurological: He has normal strength. He displays tremor. He exhibits normal muscle tone. Coordination and gait abnormal.  Reflex Scores:      Patellar reflexes are 2+ on the right side and 3+ on the left side.      Achilles reflexes are 2+ on the right side and 3+ on the left side. Decreased coordination related to tremor  Gait is with a cane, tends to lean backwards  Right delt bi tri grip HF, KE ADF 5/5 Left delt bi tri grip 4/5 Left HF , KE, ADF 5/5    Decreased R L5,S1 sensory as well as left hand sensation       Assessment & Plan:  1.  Lumbar spondylosis, reduced sensation RIght L5,S1 likely radicular  Oxycodone 7.5 mg /325mg  Takes BID occ TID ( used to take TID consistently) 70 tab per month UDS consistent PMP aware + red flag, chronic benzo per PCP  NP in 1 mo  2.  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

## 2018-01-30 ENCOUNTER — Encounter: Payer: Self-pay | Admitting: Sports Medicine

## 2018-01-30 ENCOUNTER — Ambulatory Visit: Payer: Self-pay

## 2018-01-30 ENCOUNTER — Ambulatory Visit (INDEPENDENT_AMBULATORY_CARE_PROVIDER_SITE_OTHER): Payer: Medicare HMO | Admitting: Sports Medicine

## 2018-01-30 VITALS — BP 138/70 | HR 50 | Ht 68.0 in | Wt 179.2 lb

## 2018-01-30 DIAGNOSIS — M47816 Spondylosis without myelopathy or radiculopathy, lumbar region: Secondary | ICD-10-CM

## 2018-01-30 DIAGNOSIS — M1712 Unilateral primary osteoarthritis, left knee: Secondary | ICD-10-CM | POA: Diagnosis not present

## 2018-01-30 DIAGNOSIS — M25562 Pain in left knee: Secondary | ICD-10-CM

## 2018-01-30 NOTE — Progress Notes (Signed)
Kevin [name], [facility] [phone]  Kevin [name] - 77 y.o. male MRN [id number]  Date of birth: 1940-08-18  Visit Date: 01/30/2018  PCP: Kevin Olp, MD   Referred by: Kevin Olp, MD  Scribe(s) for today's visit: Wendy Poet, LAT, ATC  SUBJECTIVE:  Kevin [name] is here for Follow-up (L knee [name]) .    Notes from office visit on 03/17/17: Kevin [name] is an established pt presenting today for f/u of his B knee [name].  He was last seen on 11/02/16 and had an injection in his L knee.  He was also prescribed a Body Helix knee sleeve.  He states that he feels like the injection did help w/ his L knee for a while but can't recall for how long it helped.  He states that he's experiencing aching L knee [name] that he rates as a 3/10.  He also reports that he's been wearing the Body Helix sleeve fairly regularly and feels like it helps.    Pt states that he is most interested in discussing options for switching his care from [name] management to [facility] for his chronic back [name].  Pt states that Kevin [name] had referred pt to [name] management.  He states that's been OK but he would like to stay w/i the [facility] system and would like to discuss some other options.  Pt states that he has chronic LBP x approximately 4 years that radiates into his B hips.  He notes that he can no longer sleep normally due to the back and hip [name] and now has to sleep in a recliner.   Compared to the last office visit on 03/17/17, his previously described knee [name] and LBP symptoms about the same.  L knee currently doesn't hurt but is popping and snapping a lot.  Currently has [name] in his R leg from this hip to knee.  States that he saw the PM&R doctor this morning and had a good visit. Current symptoms are mild in the L knee and moderate in the R LE & are radiating to the R thigh. He has been using the Gabapentin.  08/29/17: Compared to the last  office visit on 06/16/17, his previously described R hip and knee symptoms have improved but now his L hip and knee are bothering him.  The L hip and knee symptoms have been increasingly bothering him over the past few weeks w/ no MOI.  Went to ED on 08/25/17 for L flank [name].  Also saw [name] management on 08/28/17 and saw [employer].  He will be seeing them once a month. Current symptoms are aching 4/10 [name] in the L hip and L knee [name] is an 8-9/10 at it's worst & are non-radiating.  States that the L knee has almost given out on him a few times over the past couple weeks. He has been taking his Gabapentin and was put on prednisone yesterday by Kevin [name].  01/30/2018: Compared to the last office visit on 08/29/17, his previously described L knee [name] symptoms show no change.  He states that he had some really good relief from the injection at his last visit but his symptoms have started flaring up again over the past 2-3 weeks.  He states that he's had some episodes of his L knee giving way. Current symptoms are ranging from mild-severe & are nonradiating He has been going to [name] management.  He's taking Gabapentin  300 mg bid.  He had a depo 40 injection at his last visit on 08/29/17.  L knee XR - 09/10/15; L hip XR - 09/05/17 and L-spine MRI - 08/09/16   REVIEW OF SYSTEMS: Denies night time disturbances. Denies fevers, chills, or night sweats. Denies unexplained weight loss. Denies personal history of cancer. Denies changes in bowel or bladder habits. Denies recent unreported falls. Denies new or worsening dyspnea or wheezing. Reports headaches or dizziness.  Denies numbness, tingling or weakness  In the extremities.  Reports dizziness or presyncopal episodes Denies lower extremity edema     HISTORY:  Prior history reviewed and updated per electronic medical record.  Social History   Occupational History  . Occupation: retired    Comment: maintenance; electrician  Tobacco Use  .  Smoking status: Former Smoker    Packs/day: 2.00    Years: 28.00    Pack years: 56.00    Types: Cigarettes    Last attempt to quit: 05/31/1987    Years since quitting: 30.7  . Smokeless tobacco: Never Used  . Tobacco comment: quit smoking "in my 40's"  Substance and Sexual Activity  . Alcohol use: Yes    Alcohol/week: 0.0 standard drinks    Comment: rare, mikes hard lemonade  . Drug use: No    Types: Marijuana    Comment: hx of-4-5 yrs ago  . Sexual activity: Not Currently   Social History   Social History Narrative   Married 33 years in 2015. Lived together for 12 years. 2 boys from first wife. 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       Hobbies: yardwork, [place] barn, woodwork, Orthoptist     DATA OBTAINED & REVIEWED:  No results for input(s): HGBA1C, LABURIC, CREATINE in the last 8760 hours. . See HPI .   OBJECTIVE:  VS:  HT:5\' 8"  (172.7 cm)   WT:179 lb 3.2 oz (81.3 kg)  BMI:27.25    BP:138/70  HR:(!) 50bpm  TEMP: ( )  RESP:97 %   PHYSICAL EXAM: CONSTITUTIONAL: Well-developed, Well-nourished and In no acute distress PSYCHIATRIC: Alert & appropriately interactive. and Not depressed or anxious appearing. RESPIRATORY: No increased work of breathing and Trachea Midline EYES: Pupils are equal., EOM intact without nystagmus. and No scleral icterus.  VASCULAR EXAM: Warm and well perfused NEURO: unremarkable  MSK Exam: Left knee  Generalized osteophytic bossing.  5 degrees flexion contracture No overlying skin changes. Small amount of generalized synovitis bilaterally with a trace effusion on the left.  Ligamentously stable to varus and valgus straining.  He has a antalgic gait.     ASSESSMENT   1. Left knee [name], unspecified chronicity   2. Primary osteoarthritis of left knee   3. Lumbar spondylosis     PLAN:  Pertinent additional  documentation may be included in corresponding procedure notes, imaging studies, problem based documentation and patient instructions.  Procedures:  . US Guided Injection per procedure note  Medications:  No orders of the defined types were placed in this encounter.  Discussion/Instructions: No problem-specific Assessment & Plan notes found for this encounter.  . Discussed red flag symptoms that warrant earlier emergent evaluation and patient voices understanding. . Activity modifications and the importance of avoiding exacerbating activities (limiting [name] to no more than a 4 / 10 during or following activity) recommended and discussed.  Follow-up:  .  Return if symptoms worsen or fail to improve.   . If any lack of improvement consider:   . Referral for total knee arthroplasty  . At follow up will plan to consider: repeat corticosteroid injections and Can consider alternative injection types.     CMA/ATC served as Education administrator during this visit. History, Physical, and Plan performed by medical provider. Documentation and orders reviewed and attested to.      Gerda Diss, [facility] Physician

## 2018-01-30 NOTE — Patient Instructions (Signed)

## 2018-01-30 NOTE — Procedures (Signed)
PROCEDURE NOTE:  Ultrasound Guided: Injection: Left knee Images were obtained and interpreted by myself, Teresa Coombs, DO  Images have been saved and stored to PACS system. Images obtained on: GE S7 Ultrasound machine    ULTRASOUND FINDINGS:  Moderate degenerative change.  Small supraphysiologic effusion only.  Generalized synovitis.  DESCRIPTION OF PROCEDURE:  The patient's clinical condition is marked by substantial pain and/or significant functional disability. Other conservative therapy has not provided relief, is contraindicated, or not appropriate. There is a reasonable likelihood that injection will significantly improve the patient's pain and/or functional impairment.   After discussing the risks, benefits and expected outcomes of the injection and all questions were reviewed and answered, the patient wished to undergo the above named procedure.  Verbal consent was obtained.  The ultrasound was used to identify the target structure and adjacent neurovascular structures. The skin was then prepped in sterile fashion and the target structure was injected under direct visualization using sterile technique as below:  Single injection performed as below: PREP: Alcohol and Ethel Chloride APPROACH:superiolateral, single injection, 25g 1.5 in. INJECTATE: 2 cc 0.5% Marcaine and 2 cc 40mg /mL DepoMedrol ASPIRATE: None DRESSING: Band-Aid  Post procedural instructions including recommending icing and warning signs for infection were reviewed.    This procedure was well tolerated and there were no complications.   IMPRESSION: Succesful Ultrasound Guided: Injection

## 2018-02-01 ENCOUNTER — Other Ambulatory Visit: Payer: Self-pay

## 2018-02-01 ENCOUNTER — Encounter: Payer: Self-pay | Admitting: Physical Therapy

## 2018-02-01 ENCOUNTER — Ambulatory Visit: Payer: Medicare HMO | Attending: Physical Medicine & Rehabilitation | Admitting: Physical Therapy

## 2018-02-01 DIAGNOSIS — M545 Low back pain, unspecified: Secondary | ICD-10-CM

## 2018-02-01 DIAGNOSIS — G8929 Other chronic pain: Secondary | ICD-10-CM | POA: Insufficient documentation

## 2018-02-01 DIAGNOSIS — M6281 Muscle weakness (generalized): Secondary | ICD-10-CM | POA: Diagnosis not present

## 2018-02-01 DIAGNOSIS — R2689 Other abnormalities of gait and mobility: Secondary | ICD-10-CM | POA: Diagnosis not present

## 2018-02-01 DIAGNOSIS — R2681 Unsteadiness on feet: Secondary | ICD-10-CM | POA: Diagnosis not present

## 2018-02-02 NOTE — Therapy (Addendum)
[facility] [address] [place], [place], [address] Phone: [phone]   Fax:  [phone]  Physical Therapy Evaluation  Patient Details  Name: Kevin [name] MRN: [id number] Date of Birth: 16-Sep-1940 Referring Provider: Alysia Penna, MD   Encounter Date: 02/01/2018    Past Medical History:  Diagnosis Date  . Arthritis    DDD- Lower Back  . Bradycardia   . CAP (community acquired pneumonia) 03/17/2015   "THAT'S WHAT THEY ARE THINKING; THEY ARE NOT SURE"  . Carotid artery occlusion   . Chronic lower back pain    DDD  . CKD (chronic kidney disease)    Dr. Corliss Parish  . Diverticulosis   . Enlarged prostate    takes Flomax daily  . History of blood transfusion    "probably when they did aortic aneurysm"  . History of colon polyps    benign  . Hyperlipidemia   . Hypertension    takes Amlodipine and Zebeta daily  . Insomnia    takes Melatonin nightly  . Joint pain   . 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     . Muscle spasm    takes Zanaflex daily as needed  . Peripheral edema    takes Furosemide daily  . Pneumonia    hx of   . Rheumatic fever 1946  . Rheumatoid arthritis ([place])   . Short-term memory loss    minimal  . Shortness of breath dyspnea    with exertion but lasts very short period of time  . TIA (transient ischemic attack)    x 2;takes Plavix daily  . Urinary frequency    takes Flomax daily  . Urinary urgency     Past Surgical History:  Procedure Laterality Date  . ABDOMINAL AORTIC ANEURYSM REPAIR  2010  . CAROTID ENDARTERECTOMY Left 09/20/2004  . CATARACT EXTRACTION, BILATERAL     2016-2017  . COLONOSCOPY    . ENDARTERECTOMY Right 10/12/2015   Procedure: RIGHT CAROTID ENDARTERECTOMY WITH PATCH ANGIOPLASTY;  Surgeon: Elam Dutch, MD;  Location: [facility];  Service: Vascular;  Laterality: Right;  . INGUINAL HERNIA REPAIR Left 02/05/2013   Procedure:  HERNIA REPAIR INGUINAL ADULT;  Surgeon: Joyice Faster. Cornett, MD;  Location: [facility];  Service: General;  Laterality: Left;  . INGUINAL HERNIA REPAIR Left   . INSERTION OF MESH Left 02/05/2013   Procedure: INSERTION OF MESH;  Surgeon: Joyice Faster. Cornett, MD;  Location: [facility];  Service: General;  Laterality: Left;  . SHOULDER ARTHROSCOPY WITH ROTATOR CUFF REPAIR AND SUBACROMIAL DECOMPRESSION Left 05/01/2014   Procedure: LEFT ARTHROSCOPY SHOULDER SUBACROMIAL DECOMPRESSION,DISTAL CLAVICAL RESECTION AND ROTATOR CUFF REPAIR;  Surgeon: Marin Shutter, MD;  Location: [place] [facility];  Service: Orthopedics;  Laterality: Left;  . TESTICLAR CYST EXCISION Left   . TONSILLECTOMY    . VASECTOMY      There were no vitals filed for this visit.   Subjective Assessment - 02/01/18 1542    Subjective  This 77yo male was referred on 01/22/2018 for PT evaluation by Alysia Penna, MD for Hemiparesis on left dominant side (2013) & spinal stenosis of lumbar region with radiculopathy. He presents to PT evaluation ambulating without device accompanied by his wife. Patient reports uncertain if he needs PT.    Patient is accompained by:  Family member    Pertinent History  R CVA 2013, TIA x2, DDD LBP, CAD, bradycardia, CKD, HTN, RA, OA,  Limitations  Lifting;Standing;Walking;House hold activities    Patient Stated Goals  To not be light headed. Wife is to improve balance & decrease arm tremors.     Currently in Pain?  Yes    Pain Score  4    in last week, worst 10/10, best 0/10   Pain Location  Back    Pain Orientation  Posterior;Mid;Lower    Pain Descriptors / Indicators  Aching;Sharp;Spasm    Pain Type  Chronic pain    Pain Onset  More than a month ago    Pain Frequency  Constant    Aggravating Factors   walking distances (>~200') , leaning over, not wearing back brace     Pain Relieving Factors  wearing back brace, medications, sit to rest, stretch    Multiple Pain Sites  Yes    Pain  Score  0   in last week, range 3-8/10 before shot   Pain Location  Knee    Pain Orientation  Right;Left    Pain Descriptors / Indicators  Aching    Pain Type  Chronic pain    Pain Onset  More than a month ago    Pain Frequency  Constant    Aggravating Factors   walking,     Pain Relieving Factors  shots from Dr. Paulla Fore last up to ~4-5 months         [facility] PT Assessment - 02/01/18 1530      Assessment   Medical Diagnosis  Lumbar Spinal Stenosis & hx of CVA    Referring Provider  Alysia Penna, MD    Onset Date/Surgical Date  01/22/18   MD referral to PT   Hand Dominance  Right    Prior Therapy  shoulder sg 04/2014      Precautions   Precautions  Fall      Balance Screen   Has the patient fallen in the past 6 months  Yes    How many times?  1   1 fall on driveway, several times falls back into chair   Has the patient had a decrease in activity level because of a fear of falling?   No    Is the patient reluctant to leave their home because of a fear of falling?   No      Home Environment   Living Environment  Private residence    Living Arrangements  Spouse/significant other    Type of [place] Access  Level entry   2" threshold   [place]  One level   single step den to bedroom (ramped)   Home Engineer, manufacturing - 2 wheels;Cane - single point;Shower seat;Grab bars - tub/shower;Grab bars - toilet      Prior Function   Level of Independence  Independent   uses cane with longer community if no shopping cart   Vocation  Retired    Leisure  shot guns, fish      Posture/Postural Control   Posture/Postural Control  Postural limitations    Postural Limitations  Rounded Shoulders;Forward head      ROM / Strength   AROM / PROM / Strength  Strength;AROM      AROM   Overall AROM Comments  General trunk flexibility restricted ~50% with chronic issues. Also hamstring, hip flexor & hamstring flexiblity limitations.       Strength   Overall Strength  Deficits     Strength Assessment Site  Hip;Knee;Ankle    Right/Left  Hip  Right;Left    Right Hip Flexion  5/5    Right Hip Extension  4/5    Right Hip ABduction  4/5    Left Hip Flexion  5/5    Left Hip Extension  4/5    Left Hip ABduction  4/5    Right/Left Knee  Right;Left    Right Knee Extension  5/5    Left Knee Extension  5/5    Right/Left Ankle  Right;Left    Right Ankle Dorsiflexion  5/5    Left Ankle Dorsiflexion  5/5      Transfers   Transfers  Sit to Stand;Stand to Sit    Sit to Stand  6: Modified independent (Device/Increase time);Without upper extremity assist;From chair/3-in-1    Stand to Sit  6: Modified independent (Device/Increase time);Without upper extremity assist;To chair/3-in-1      Ambulation/Gait   Ambulation/Gait  Yes    Ambulation/Gait Assistance  6: Modified independent (Device/Increase time)    Ambulation Distance (Feet)  300 Feet    Assistive device  None    Gait Pattern  Step-through pattern;Decreased stride length    Ambulation Surface  Indoor;Level    Gait velocity  2.49 ft/sec    Stairs  Yes    Stairs Assistance  6: Modified independent (Device/Increase time)    Stair Management Technique  One rail Right;Alternating pattern;Forwards;Step to pattern   pt descend step-to due to knee pain   Number of Stairs  4      Standardized Balance Assessment   Standardized Balance Assessment  Berg Balance Test;Timed Up and Go Test      Berg Balance Test   Sit to Stand  Able to stand without using hands and stabilize independently    Standing Unsupported  Able to stand safely 2 minutes    Sitting with Back Unsupported but Feet Supported on Floor or Stool  Able to sit safely and securely 2 minutes    Stand to Sit  Sits safely with minimal use of hands    Transfers  Able to transfer safely, minor use of hands    Standing Unsupported with Eyes Closed  Able to stand 10 seconds safely    Standing Ubsupported with Feet Together  Able to place feet together independently  and stand 1 minute safely    From Standing, Reach Forward with Outstretched Arm  Can reach confidently >25 cm (10")    From Standing Position, Pick up Object from Floor  Able to pick up shoe safely and easily    From Standing Position, Turn to Look Behind Over each Shoulder  Looks behind from both sides and weight shifts well    Turn 360 Degrees  Able to turn 360 degrees safely but slowly    Standing Unsupported, Alternately Place Feet on Step/Stool  Able to complete 4 steps without aid or supervision    Standing Unsupported, One Foot in Front  Able to plae foot ahead of the other independently and hold 30 seconds    Standing on One Leg  Tries to lift leg/unable to hold 3 seconds but remains standing independently    Total Score  48    Berg comment:  Berg Balance 46-52/56 indicates moderate fall risk      Timed Up and Go Test   Normal TUG (seconds)  11.56   no device   Cognitive TUG (seconds)  14.13   no device without cognitive limitations for task   TUG Comments  TUG scores WNL with low  fall risk      Functional Gait  Assessment   Gait assessed   Yes    Gait Level Surface  Walks 20 ft, slow speed, abnormal gait pattern, evidence for imbalance or deviates 10-15 in outside of the 12 in walkway width. Requires more than 7 sec to ambulate 20 ft.   20' in 8.03sec   Change in Gait Speed  Able to change speed, demonstrates mild gait deviations, deviates 6-10 in outside of the 12 in walkway width, or no gait deviations, unable to achieve a major change in velocity, or uses a change in velocity, or uses an assistive device.    Gait with Horizontal Head Turns  Performs head turns smoothly with slight change in gait velocity (eg, minor disruption to smooth gait path), deviates 6-10 in outside 12 in walkway width, or uses an assistive device.    Gait with Vertical Head Turns  Performs task with slight change in gait velocity (eg, minor disruption to smooth gait path), deviates 6 - 10 in outside 12 in  walkway width or uses assistive device    Gait and Pivot Turn  Pivot turns safely within 3 sec and stops quickly with no loss of balance.    Step Over Obstacle  Is able to step over one shoe box (4.5 in total height) without changing gait speed. No evidence of imbalance.    Gait with Narrow Base of Support  Ambulates less than 4 steps heel to toe or cannot perform without assistance.    Gait with Eyes Closed  Walks 20 ft, slow speed, abnormal gait pattern, evidence for imbalance, deviates 10-15 in outside 12 in walkway width. Requires more than 9 sec to ambulate 20 ft.    Ambulating Backwards  Walks 20 ft, slow speed, abnormal gait pattern, evidence for imbalance, deviates 10-15 in outside 12 in walkway width.    Steps  Alternating feet, must use rail.   descends step-to due to arthritic knee pain, not balance   Total Score  16    FGA comment:  <19/30 indicates fall risk                Objective measurements completed on examination: See above findings.              PT Education - 02/01/18 1630    Education Details  Exercise recommendations: see PT note for details    Person(s) Educated  Patient;Spouse    Methods  Explanation;Verbal cues    Comprehension  Verbalized understanding      PT explained tests & measures with indication for fall risk.  PT instructed in benefits & recommendations for ongoing exercise including components for flexibility, strength, balance & endurance. PT recommend considering [facility] classes of Chair Yoga & SAIL balance class. Pt & wife plan to visit [facility] after PT today. Pt & wife verbalize understanding.  Pt ambulated with cane in RUE: initially coordinated with RLE creating improper trunk & balance movements. PT instructed in coordinating with LLE to create proper countertrunk rotation & improved balance. PT also ambulated with cane with rubber quad tip for increased stability & stands alone. Pt reports he likes feel of cane  with rubber quad tip & may acquire tip at [facility]. PT recommended using cane for community outings especially longer distances or using shopping cart if available. Pt & wife verbalize understanding.             Plan - 02/01/18 1800    Clinical  Impression Statement  This 77yo male has history of functional limitations due to chronic arthritis issues including pain. He has mild residual weakness on left non-dominant side. Patient has moderate fall risk as noted by [facility] test of 48/56. His Timed Up & Go standard and cognitive are both within normal limits indicating low fall risk. Gait Velocity of 2.49 ft/sec with minimal gait deviations indicates low fall risk.  Functional Gait Assessment of 16/30 indicates fall risk with score <19/30. Patient does not seem receptive to ongoing PT at this time. PT recommended ongoing exercise including considering classes at [facility]. Patient & wife to discuss outcome measures & recommendations by PT. They will contact PT if he would like to pursue skilled PT services to work on balance & gait deficits.     History and Personal Factors relevant to plan of care:  lives with wife who works 76 hrs/wk    Clinical Presentation  Stable    Clinical Decision Making  Low    Rehab Potential  Good    PT Frequency  One time visit    PT Next Visit Plan  Pt & wife to discuss outcomes & recommendations for exercise,  PT will set goals & resend certification if pt wants to pursue PT    Consulted and Agree with Plan of Care  Patient;Family member/caregiver    Family Member Consulted  wife       Patient will benefit from skilled therapeutic intervention in order to improve the following deficits and impairments:  Abnormal gait, Decreased activity tolerance, Decreased balance, Decreased strength, Postural dysfunction  Visit Diagnosis: Chronic midline low back pain without sciatica  Muscle weakness (generalized)  Unsteadiness on feet  Other  abnormalities of gait and mobility     Problem List Patient Active Problem List   Diagnosis Date Noted  . Constipation 09/19/2017  . Lumbar spondylosis 03/17/2017  . PAD (peripheral artery disease) ([place]) 01/08/2016  . Diastolic CHF ([place]) 46/65/9935  . CAP (community acquired pneumonia) 03/17/2015  . CKD (chronic kidney disease), stage III ([place]) 01/27/2014  . Family history of malignant neoplasm of gastrointestinal tract 05/15/2013  . Personal history of colonic polyps 05/15/2013  . AAA (abdominal aortic aneurysm, ruptured) ([place]) 01/17/2013  . Inguinal hernia 05/01/2012  . Carotid artery stenosis s/p L carotid endarterectomy 01/12/2012  . History of CVA (cerebrovascular accident) 11/17/2011  . Chronic pain syndrome 12/23/2009  . BURSITIS, HIP 12/21/2009  . Essential tremor 10/05/2009  . ACTINIC KERATOSIS, EAR, LEFT 03/09/2009  . UNSPECIFIED ANEMIA 12/05/2008  . Primary osteoarthritis of left knee 11/02/2007  . 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 BPH (benign prostatic hyperplasia) 06/04/2007  . Fasting hyperglycemia 02/05/2007  . Hyperlipidemia 01/03/2007  . Essential hypertension 01/03/2007    Jamey Reas PT, DPT 02/02/2018, 1:16 PM  [facility] [address] [place], [place], [address] Phone: [phone]   Fax:  [phone]  Name: JOHNTA COUTS MRN: [id number] Date of Birth: 1941-05-14   PHYSICAL THERAPY DISCHARGE SUMMARY  Visits from Start of Care:1   Plan: Patient agrees to discharge.  Patient goals were not met. Patient is being discharged due to not returning since the last visit.  ?????      Pt did not return after Eval.   Lyndee Hensen, PT, DPT 11:49 AM  08/14/18

## 2018-02-13 ENCOUNTER — Encounter: Payer: Self-pay | Admitting: Sports Medicine

## 2018-02-19 ENCOUNTER — Encounter: Payer: Medicare HMO | Attending: Physical Medicine & Rehabilitation | Admitting: Registered Nurse

## 2018-02-19 ENCOUNTER — Encounter: Payer: Self-pay | Admitting: Registered Nurse

## 2018-02-19 ENCOUNTER — Other Ambulatory Visit: Payer: Self-pay | Admitting: Family Medicine

## 2018-02-19 VITALS — BP 143/73 | HR 51 | Resp 14 | Ht 67.0 in | Wt 181.0 lb

## 2018-02-19 DIAGNOSIS — M25561 Pain in right knee: Secondary | ICD-10-CM

## 2018-02-19 DIAGNOSIS — I69852 Hemiplegia and hemiparesis following other cerebrovascular disease affecting left dominant side: Secondary | ICD-10-CM | POA: Diagnosis not present

## 2018-02-19 DIAGNOSIS — Z79899 Other long term (current) drug therapy: Secondary | ICD-10-CM

## 2018-02-19 DIAGNOSIS — M25562 Pain in left knee: Secondary | ICD-10-CM | POA: Diagnosis not present

## 2018-02-19 DIAGNOSIS — M25551 Pain in right hip: Secondary | ICD-10-CM | POA: Insufficient documentation

## 2018-02-19 DIAGNOSIS — I739 Peripheral vascular disease, unspecified: Secondary | ICD-10-CM | POA: Insufficient documentation

## 2018-02-19 DIAGNOSIS — M47816 Spondylosis without myelopathy or radiculopathy, lumbar region: Secondary | ICD-10-CM | POA: Diagnosis not present

## 2018-02-19 DIAGNOSIS — Z5181 Encounter for therapeutic drug level monitoring: Secondary | ICD-10-CM | POA: Diagnosis not present

## 2018-02-19 DIAGNOSIS — M1712 Unilateral primary osteoarthritis, left knee: Secondary | ICD-10-CM | POA: Diagnosis not present

## 2018-02-19 DIAGNOSIS — G894 Chronic pain syndrome: Secondary | ICD-10-CM | POA: Diagnosis not present

## 2018-02-19 DIAGNOSIS — G8929 Other chronic pain: Secondary | ICD-10-CM

## 2018-02-19 MED ORDER — OXYCODONE-ACETAMINOPHEN 7.5-325 MG PO TABS
1.0000 | ORAL_TABLET | Freq: Two times a day (BID) | ORAL | 0 refills | Status: DC | PRN
Start: 2018-02-19 — End: 2018-04-16

## 2018-02-19 NOTE — Progress Notes (Signed)
Subjective:    Patient ID: Kevin [name], male    DOB: Oct 24, 1940, 77 y.o.   MRN: [id number]  HPI: Kevin [name] is a 77 year old male who returns for follow up appointment for chronic pain and medication refill. He states his pain is located in his lower back and bilateral knees L>R. He rates his pain 8. His current exercise regime is walking.   Kevin [name] reports on Saturday 02/17/2018 he was standing on a ladder hanging up some lights when he lost his balanced and fell and landed on his back. His wife helped him up he didn't seek medical attention. Educated on falls prevention and encouraged not to get on a ladder in the future, asked to ask his son for help. He verbalizes understanding.   Kevin [name] Morphine Equivalent is 26.25 MME. He  is also prescribed Diazepam  by Dr. [name] .We have discussed the black box warning of using opioids and benzodiazepines. I highlighted the dangers of using these drugs together and discussed the adverse events including respiratory suppression, overdose, cognitive impairment and importance of compliance with current regimen. We will continue to monitor and adjust as indicated.   Last Oral Swab was Performed on 12/22/2017, it was consistent.    Pain Inventory Average Pain 5 Pain Right Now 8 My pain is constant, sharp, dull and aching  In the last 24 hours, has pain interfered with the following? General activity 8 Relation with others 7 Enjoyment of life 9 What TIME of day is your pain at its worst? all Sleep (in general) Good  Pain is worse with: walking, bending and some activites Pain improves with: medication Relief from Meds: 7  Mobility walk with assistance use a cane how many minutes can you walk? 10 ability to climb steps?  no do you drive?  yes  Function retired  Neuro/Psych tremor trouble walking  Prior Studies Any changes since last visit?  no  Physicians involved in your care Any changes since last visit?  no   Family  History  Problem Relation Age of Onset  . Parkinsonism Father   . Dementia Father   . Cancer Father        Throat  . Colon cancer Mother        died in 62s  . Cancer Mother        Colon   Social History   Socioeconomic History  . Marital status: Married    Spouse name: Not on file  . Number of children: Not on file  . Years of education: Not on file  . Highest education level: Not on file  Occupational History  . Occupation: retired    Comment: maintenance; electrician  Social Needs  . Financial resource strain: Not on file  . Food insecurity:    Worry: Not on file    Inability: Not on file  . Transportation needs:    Medical: Not on file    Non-medical: Not on file  Tobacco Use  . Smoking status: Former Smoker    Packs/day: 2.00    Years: 28.00    Pack years: 56.00    Types: Cigarettes    Last attempt to quit: 05/31/1987    Years since quitting: 30.7  . Smokeless tobacco: Never Used  . Tobacco comment: quit smoking "in my 32's"  Substance and Sexual Activity  . Alcohol use: Yes    Alcohol/week: 0.0 standard drinks    Comment: rare, mikes hard lemonade  .  Drug use: No    Types: Marijuana    Comment: hx of-4-5 yrs ago  . Sexual activity: Not Currently  Lifestyle  . Physical activity:    Days per week: Not on file    Minutes per session: Not on file  . Stress: Not on file  Relationships  . Social connections:    Talks on phone: Not on file    Gets together: Not on file    Attends religious service: Not on file    Active member of club or organization: Not on file    Attends meetings of clubs or organizations: Not on file    Relationship status: Not on file  Other Topics Concern  . Not on file  Social History Narrative   Married 33 years in 2015. Lived together for 12 years. 2 boys from first wife. 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         Hobbies: yardwork, [place] barn, woodwork, Orthoptist   Past Surgical History:  Procedure Laterality Date  . ABDOMINAL AORTIC ANEURYSM REPAIR  2010  . CAROTID ENDARTERECTOMY Left 09/20/2004  . CATARACT EXTRACTION, BILATERAL     2016-2017  . COLONOSCOPY    . ENDARTERECTOMY Right 10/12/2015   Procedure: RIGHT CAROTID ENDARTERECTOMY WITH PATCH ANGIOPLASTY;  Surgeon: Elam Dutch, MD;  Location: [facility];  Service: Vascular;  Laterality: Right;  . INGUINAL HERNIA REPAIR Left 02/05/2013   Procedure: HERNIA REPAIR INGUINAL ADULT;  Surgeon: Joyice Faster. Cornett, MD;  Location: [facility];  Service: General;  Laterality: Left;  . INGUINAL HERNIA REPAIR Left   . INSERTION OF MESH Left 02/05/2013   Procedure: INSERTION OF MESH;  Surgeon: Joyice Faster. Cornett, MD;  Location: [facility];  Service: General;  Laterality: Left;  . SHOULDER ARTHROSCOPY WITH ROTATOR CUFF REPAIR AND SUBACROMIAL DECOMPRESSION Left 05/01/2014   Procedure: LEFT ARTHROSCOPY SHOULDER SUBACROMIAL DECOMPRESSION,DISTAL CLAVICAL RESECTION AND ROTATOR CUFF REPAIR;  Surgeon: Marin Shutter, MD;  Location: [facility];  Service: Orthopedics;  Laterality: Left;  . TESTICLAR CYST EXCISION Left   . TONSILLECTOMY    . VASECTOMY     Past Medical History:  Diagnosis Date  . Arthritis    DDD- Lower Back  . Bradycardia   . CAP (community acquired pneumonia) 03/17/2015   "THAT'S WHAT THEY ARE THINKING; THEY ARE NOT SURE"  . Carotid artery occlusion   . Chronic lower back pain    DDD  . CKD (chronic kidney disease)    Dr. Corliss Parish  . Diverticulosis   . Enlarged prostate    takes Flomax daily  . History of blood transfusion    "probably when they did aortic aneurysm"  . History of colon polyps    benign  . Hyperlipidemia   . Hypertension    takes Amlodipine and Zebeta daily  . Insomnia    takes Melatonin nightly  . Joint pain   . 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      . Muscle spasm    takes Zanaflex daily as needed  . Peripheral edema    takes Furosemide daily  . Pneumonia    hx of   . Rheumatic fever 1946  . Rheumatoid arthritis ([place])   . Short-term memory loss    minimal  . Shortness of breath dyspnea  with exertion but lasts very short period of time  . TIA (transient ischemic attack)    x 2;takes Plavix daily  . Urinary frequency    takes Flomax daily  . Urinary urgency    BP (!) 143/73 (BP Location: Left Arm, Patient Position: Sitting, Cuff Size: Normal)   Pulse (!) 51   Resp 14   Ht 5\' 7"  (1.702 m)   Wt 181 lb (82.1 kg)   SpO2 97%   BMI 28.35 kg/m   Opioid Risk Score:   Fall Risk Score:  `1  Depression screen PHQ 2/9  Depression screen [facility] 2/9 01/22/2018 10/19/2017 10/05/2017 09/25/2017 07/03/2017 09/08/2016 10/20/2015  Decreased Interest 0 0 0 0 0 0 0  Down, Depressed, Hopeless 0 0 0 0 0 0 0  PHQ - 2 Score 0 0 0 0 0 0 0  Some recent data might be hidden    Review of Systems  Constitutional: Negative.   HENT: Negative.   Eyes: Negative.   Respiratory: Negative.   Cardiovascular: Negative.   Gastrointestinal: Negative.   Endocrine: Negative.   Genitourinary: Negative.   Musculoskeletal: Positive for back pain and gait problem.  Skin: Negative.   Neurological: Positive for tremors.  Psychiatric/Behavioral: Negative.   All other systems reviewed and are negative.      Objective:   Physical Exam  Constitutional: He is oriented to person, place, and time. He appears well-developed and well-nourished.  HENT:  Head: Normocephalic and atraumatic.  Neck: Normal range of motion. Neck supple.  Cardiovascular: Normal rate and regular rhythm.  Pulmonary/Chest: Effort normal and breath sounds normal.  Musculoskeletal:  Normal Muscle Bulk and Muscle Testing Reveals: Upper Extremities: Full ROM and Muscle Strength 5/5 Thoracic Paraspinal Tenderness: T-10-T-12  ( Resolving Ecchymosis) Lumbar Paraspinal Tenderness:  L-3-L-5 Lower Extremities: Full ROM and Muscle Strength 5/5 Arises from chair slowly using cane for support Antalgic Gait  Neurological: He is alert and oriented to person, place, and time.  Skin: Skin is warm and dry.  Psychiatric: He has a normal mood and affect. His behavior is normal.  Nursing note and vitals reviewed.         Assessment & Plan:  1. Lumbar Spondylosis/ Lumbar Stenosis:02/19/2018 Continue current medication regime. Refilled:Oxycodone 7.5/325 mg one tablet twice daily as needed, may take an extra tablet when pain is moderate 10 days out of the month.for #70.02/19/2018 We will continue the opioid monitoring program, this consists of regular clinic visits, examinations, urine drug screen, pill counts as well as use of [place] Controlled Substance Reporting System. 2. ChronicBilateralKnee Pain: Orthopedist Following Dr. Paulla Fore. 02/19/2018  63minutes of face to face patient care time was spent during this visit. All questions were encouraged and answered.  F/U in 1 month

## 2018-03-05 DIAGNOSIS — E785 Hyperlipidemia, unspecified: Secondary | ICD-10-CM | POA: Diagnosis not present

## 2018-03-05 DIAGNOSIS — I129 Hypertensive chronic kidney disease with stage 1 through stage 4 chronic kidney disease, or unspecified chronic kidney disease: Secondary | ICD-10-CM | POA: Diagnosis not present

## 2018-03-05 DIAGNOSIS — Z6831 Body mass index (BMI) 31.0-31.9, adult: Secondary | ICD-10-CM | POA: Diagnosis not present

## 2018-03-05 DIAGNOSIS — D631 Anemia in chronic kidney disease: Secondary | ICD-10-CM | POA: Diagnosis not present

## 2018-03-05 DIAGNOSIS — N183 Chronic kidney disease, stage 3 (moderate): Secondary | ICD-10-CM | POA: Diagnosis not present

## 2018-03-05 DIAGNOSIS — R5383 Other fatigue: Secondary | ICD-10-CM | POA: Diagnosis not present

## 2018-03-05 LAB — IRON,TIBC AND FERRITIN PANEL
FERRITIN: 480
Iron: 120

## 2018-03-05 LAB — BASIC METABOLIC PANEL
BUN: 19 (ref 4–21)
Creatinine: 1.9 — AB (ref 0.6–1.3)
GLUCOSE: 126
Potassium: 4.6 (ref 3.4–5.3)
SODIUM: 140 (ref 137–147)

## 2018-03-05 LAB — CBC AND DIFFERENTIAL: HEMOGLOBIN: 11.5 — AB (ref 13.5–17.5)

## 2018-03-09 ENCOUNTER — Encounter: Payer: Self-pay | Admitting: Family Medicine

## 2018-03-13 ENCOUNTER — Other Ambulatory Visit: Payer: Self-pay | Admitting: Family Medicine

## 2018-03-14 ENCOUNTER — Ambulatory Visit (INDEPENDENT_AMBULATORY_CARE_PROVIDER_SITE_OTHER): Payer: Medicare HMO | Admitting: Sports Medicine

## 2018-03-14 ENCOUNTER — Ambulatory Visit: Payer: Self-pay

## 2018-03-14 ENCOUNTER — Encounter: Payer: Self-pay | Admitting: Sports Medicine

## 2018-03-14 VITALS — BP 146/70 | HR 52 | Ht 67.0 in | Wt 180.2 lb

## 2018-03-14 DIAGNOSIS — G8929 Other chronic pain: Secondary | ICD-10-CM

## 2018-03-14 DIAGNOSIS — M170 Bilateral primary osteoarthritis of knee: Secondary | ICD-10-CM | POA: Diagnosis not present

## 2018-03-14 DIAGNOSIS — M25562 Pain in left knee: Principal | ICD-10-CM

## 2018-03-14 DIAGNOSIS — M25561 Pain in right knee: Principal | ICD-10-CM

## 2018-03-14 NOTE — Procedures (Signed)
PROCEDURE NOTE:  Ultrasound Guided: Injection: Bilateral knee Images were obtained and interpreted by myself, Teresa Coombs, DO  Images have been saved and stored to PACS system. Images obtained on: GE S7 Ultrasound machine    ULTRASOUND FINDINGS:  Bilateral small effusions with moderate synovitis  DESCRIPTION OF PROCEDURE:  The patient's clinical condition is marked by substantial pain and/or significant functional disability. Other conservative therapy has not provided relief, is contraindicated, or not appropriate. There is a reasonable likelihood that injection will significantly improve the patient's pain and/or functional impairment.   After discussing the risks, benefits and expected outcomes of the injection and all questions were reviewed and answered, the patient wished to undergo the above named procedure.  Verbal consent was obtained.  The ultrasound was used to identify the target structure and adjacent neurovascular structures. The skin was then prepped in sterile fashion and the target structure was injected under direct visualization using sterile technique as below:  Bilateral injections performed under identical technique as below: PREP: Alcohol and Ethel Chloride APPROACH:superiolateral, single injection, 25g 1.5 in. INJECTATE: 2 cc 0.5% Marcaine and 1 cc 40mg /mL DepoMedrol ASPIRATE: None DRESSING: Band-Aid  Post procedural instructions including recommending icing and warning signs for infection were reviewed.    This procedure was well tolerated and there were no complications.   IMPRESSION: Succesful Ultrasound Guided: Injection

## 2018-03-14 NOTE — Progress Notes (Signed)
Kevin [name]. Kevin [name], [place] at [facility] [phone]  CARI VANDEBERG - 77 y.o. male MRN [id number]  Date of birth: 1940-09-19  Visit Date: 03/14/2018  PCP: Kevin Olp, MD   Referred by: Kevin Olp, MD  Scribe(s) for today's visit: Kevin [name], LAT, ATC  SUBJECTIVE:  Kevin [name] is here for Follow-up (B knee [name]) .    Notes from office visit on 03/17/17: Kevin [name] is an established pt presenting today for f/u of his B knee [name].  He was last seen on 11/02/16 and had an injection in his L knee.  He was also prescribed a Body Helix knee sleeve.  He states that he feels like the injection did help w/ his L knee for a while but can't recall for how long it helped.  He states that he's experiencing aching L knee [name] that he rates as a 3/10.  He also reports that he's been wearing the Body Helix sleeve fairly regularly and feels like it helps.    Pt states that he is most interested in discussing options for switching his care from [name] management to Kevin [name] for his chronic back [name].  Pt states that Kevin [name] had referred pt to [name] management.  He states that's been OK but he would like to stay w/i the [facility] system and would like to discuss some other options.  Pt states that he has chronic LBP x approximately 4 years that radiates into his B hips.  He notes that he can no longer sleep normally due to the back and hip [name] and now has to sleep in a recliner.   Compared to the last office visit on 03/17/17, his previously described knee [name] and LBP symptoms about the same.  L knee currently doesn't hurt but is popping and snapping a lot.  Currently has [name] in his R leg from this hip to knee.  States that he saw the PM&R doctor this morning and had a good visit. Current symptoms are mild in the L knee and moderate in the R LE & are radiating to the R thigh. He has been using the Gabapentin.  08/29/17: Compared to the last  office visit on 06/16/17, his previously described R hip and knee symptoms have improved but now his L hip and knee are bothering him.  The L hip and knee symptoms have been increasingly bothering him over the past few weeks w/ no MOI.  Went to ED on 08/25/17 for L flank [name].  Also saw [name] management on 08/28/17 and saw Kevin [name].  He will be seeing them once a month. Current symptoms are aching 4/10 [name] in the L hip and L knee [name] is an 8-9/10 at it's worst & are non-radiating.  States that the L knee has almost given out on him a few times over the past couple weeks. He has been taking his Gabapentin and was put on prednisone yesterday by Kevin [name].  01/30/2018: Compared to the last office visit on 08/29/17, his previously described L knee [name] symptoms show no change.  He states that he had some really good relief from the injection at his last visit but his symptoms have started flaring up again over the past 2-3 weeks.  He states that he's had some episodes of his L knee giving way. Current symptoms are ranging from mild-severe & are nonradiating He has been going to [name] management.  He's taking Gabapentin  300 mg bid.  He had a depo 40 injection at his last visit on 08/29/17.  03/14/2018: Compared to the last office visit on 01/30/18, his previously described knee symptoms are worsening over the past week.  He reports that now both knees are bothering him with the R knee being worse than the L currently.  He reports that his knees are bothering him so much that he has started using a cane. Current symptoms are moderate to severe & are nonradiating He has been continuing to go to [name] and has been taking Gabapentin 300 mg bid.  He's continuing to take the Oxycodone bid.  He's had 2 steroid L knee injections on 08/29/17 and 01/30/18.  L knee XR - 09/10/15; L hip XR - 09/05/17 and L-spine MRI - 08/09/16  REVIEW OF SYSTEMS: Denies night time disturbances. Denies fevers, chills, or night  sweats. Denies unexplained weight loss. Denies personal history of cancer. Denies changes in bowel or bladder habits. Denies recent unreported falls. Denies new or worsening dyspnea or wheezing. Reports headaches or dizziness.  Denies numbness, tingling or weakness  In the extremities.  Reports dizziness or presyncopal episodes Denies lower extremity edema     HISTORY:  Prior history reviewed and updated per electronic medical record.  Social History   Occupational History  . Occupation: retired    Comment: maintenance; electrician  Tobacco Use  . Smoking status: Former Smoker    Packs/day: 2.00    Years: 28.00    Pack years: 56.00    Types: Cigarettes    Last attempt to quit: 05/31/1987    Years since quitting: 30.8  . Smokeless tobacco: Never Used  . Tobacco comment: quit smoking "in my 60's"  Substance and Sexual Activity  . Alcohol use: Yes    Alcohol/week: 0.0 standard drinks    Comment: rare, mikes hard lemonade  . Drug use: No    Types: Marijuana    Comment: hx of-4-5 yrs ago  . Sexual activity: Not Currently   Social History   Social History Narrative   Married 33 years in 2015. Lived together for 12 years. 2 boys from first wife. 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       Hobbies: yardwork, [place] barn, woodwork, Orthoptist     DATA OBTAINED & REVIEWED:  No results for input(s): HGBA1C, LABURIC, CREATINE in the last 8760 hours. . See HPI .   OBJECTIVE:  VS:  HT:5\' 7"  (170.2 cm)   WT:180 lb 3.2 oz (81.7 kg)  BMI:28.22    BP:(!) 146/70  HR:(!) 52bpm  TEMP: ( )  RESP:97 %   PHYSICAL EXAM: CONSTITUTIONAL: Well-developed, Well-nourished and In no acute distress PSYCHIATRIC: Alert & appropriately interactive. and Not depressed or anxious appearing. RESPIRATORY: No increased work of breathing and Trachea Midline EYES: Pupils are  equal., EOM intact without nystagmus. and No scleral icterus.  VASCULAR EXAM: Warm and well perfused Edema: 2+ pitting edema pretibial bilaterally NEURO: unremarkable  MSK Exam: Bilateral knee  Generalized osteophytic bossing.  5 degrees flexion contracture No overlying skin changes. Small amount of generalized synovitis bilaterally with a trace effusion on the left.  Ligamentously stable to varus and valgus straining.  He has a antalgic gait.     ASSESSMENT   1. Chronic [name] of both knees   2. Primary osteoarthritis of  both knees     PLAN:  Pertinent additional documentation may be included in corresponding procedure notes, imaging studies, problem based documentation and patient instructions.  Procedures:  . US Guided Injection per procedure note  Medications:  No orders of the defined types were placed in this encounter.  Discussion/Instructions: No problem-specific Assessment & Plan notes found for this encounter.  . Bilateral injections performed today . Continue previously prescribed home exercise program.  . Discussed red flag symptoms that warrant earlier emergent evaluation and patient voices understanding. . Activity modifications and the importance of avoiding exacerbating activities (limiting [name] to no more than a 4 / 10 during or following activity) recommended and discussed. . Discussed appropriate use of both heat and ice with the patient today.   Follow-up:  . Return in about 10 weeks (around 05/23/2018).   . If any lack of improvement consider: referral to Orthopedics for TKR and    . At follow up will plan to consider: repeat corticosteroid injections or VISCO / Zillretta     CMA/ATC served as scribe during this visit. History, Physical, and Plan performed by medical provider. Documentation and orders reviewed and attested to.      Gerda Diss, [facility] Physician

## 2018-03-14 NOTE — Patient Instructions (Addendum)

## 2018-03-19 ENCOUNTER — Encounter: Payer: Medicare HMO | Attending: Physical Medicine & Rehabilitation | Admitting: Registered Nurse

## 2018-03-19 ENCOUNTER — Encounter: Payer: Self-pay | Admitting: Registered Nurse

## 2018-03-19 VITALS — BP 108/67 | HR 60 | Ht 67.5 in | Wt 181.8 lb

## 2018-03-19 DIAGNOSIS — M25562 Pain in left knee: Secondary | ICD-10-CM

## 2018-03-19 DIAGNOSIS — Z5181 Encounter for therapeutic drug level monitoring: Secondary | ICD-10-CM | POA: Diagnosis not present

## 2018-03-19 DIAGNOSIS — M47816 Spondylosis without myelopathy or radiculopathy, lumbar region: Secondary | ICD-10-CM | POA: Insufficient documentation

## 2018-03-19 DIAGNOSIS — G8929 Other chronic pain: Secondary | ICD-10-CM | POA: Diagnosis not present

## 2018-03-19 DIAGNOSIS — G894 Chronic pain syndrome: Secondary | ICD-10-CM | POA: Insufficient documentation

## 2018-03-19 DIAGNOSIS — I69852 Hemiplegia and hemiparesis following other cerebrovascular disease affecting left dominant side: Secondary | ICD-10-CM | POA: Diagnosis not present

## 2018-03-19 DIAGNOSIS — Z79899 Other long term (current) drug therapy: Secondary | ICD-10-CM | POA: Diagnosis not present

## 2018-03-19 DIAGNOSIS — M25551 Pain in right hip: Secondary | ICD-10-CM | POA: Insufficient documentation

## 2018-03-19 DIAGNOSIS — M25561 Pain in right knee: Secondary | ICD-10-CM

## 2018-03-19 DIAGNOSIS — I739 Peripheral vascular disease, unspecified: Secondary | ICD-10-CM | POA: Insufficient documentation

## 2018-03-19 DIAGNOSIS — M1712 Unilateral primary osteoarthritis, left knee: Secondary | ICD-10-CM | POA: Diagnosis not present

## 2018-03-19 NOTE — Progress Notes (Signed)
Subjective:    Patient ID: Kevin [name], male    DOB: 10/12/1940, 77 y.o.   MRN: [id number]  HPI: [name] is a 77 year old male who returns for follow up appointment for chronic pain and medication refill. He states his pain is located in his lower back. He rates his pain 6. His current exercise regime is walking.  Mr. Behrle Morphine Equivalent is 26.25 MME. He is also prescribed Diazepam by Dr. Ouida Sills.We have discussed the black box warning of using opioids and benzodiazepines. I highlighted the dangers of using these drugs together and discussed the adverse events including respiratory suppression, overdose, cognitive impairment and importance of compliance with current regimen. We will continue to monitor and adjust as indicated.   Last Oral Swab was Performed on 12/22/2017, it was consistent.   Pain Inventory Average Pain 8 Pain Right Now 6 My pain is na  In the last 24 hours, has pain interfered with the following? General activity 7 Relation with others 5 Enjoyment of life 7 What TIME of day is your pain at its worst? daytime Sleep (in general) Fair  Pain is worse with: walking, bending, standing and some activites Pain improves with: rest and heat/ice Relief from Meds: 8  Mobility use a cane ability to climb steps?  yes  Function retired  Neuro/Psych weakness spasms  Prior Studies Any changes since last visit?  no  Physicians involved in your care Any changes since last visit?  no   Family History  Problem Relation Age of Onset  . Parkinsonism Father   . Dementia Father   . Cancer Father        Throat  . Colon cancer Mother        died in 53s  . Cancer Mother        Colon   Social History   Socioeconomic History  . Marital status: Married    Spouse name: Not on file  . Number of children: Not on file  . Years of education: Not on file  . Highest education level: Not on file  Occupational History  . Occupation: retired    Comment:  maintenance; electrician  Social Needs  . Financial resource strain: Not on file  . Food insecurity:    Worry: Not on file    Inability: Not on file  . Transportation needs:    Medical: Not on file    Non-medical: Not on file  Tobacco Use  . Smoking status: Former Smoker    Packs/day: 2.00    Years: 28.00    Pack years: 56.00    Types: Cigarettes    Last attempt to quit: 05/31/1987    Years since quitting: 30.8  . Smokeless tobacco: Never Used  . Tobacco comment: quit smoking "in my 40's"  Substance and Sexual Activity  . Alcohol use: Yes    Alcohol/week: 0.0 standard drinks    Comment: rare, mikes hard lemonade  . Drug use: No    Types: Marijuana    Comment: hx of-4-5 yrs ago  . Sexual activity: Not Currently  Lifestyle  . Physical activity:    Days per week: Not on file    Minutes per session: Not on file  . Stress: Not on file  Relationships  . Social connections:    Talks on phone: Not on file    Gets together: Not on file    Attends religious service: Not on file    Active member of club  or organization: Not on file    Attends meetings of clubs or organizations: Not on file    Relationship status: Not on file  Other Topics Concern  . Not on file  Social History Narrative   Married 33 years in 2015. Lived together for 12 years. 2 boys from first wife. 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       Hobbies: yardwork, [place] barn, woodwork, Orthoptist   Past Surgical History:  Procedure Laterality Date  . ABDOMINAL AORTIC ANEURYSM REPAIR  2010  . CAROTID ENDARTERECTOMY Left 09/20/2004  . CATARACT EXTRACTION, BILATERAL     2016-2017  . COLONOSCOPY    . ENDARTERECTOMY Right 10/12/2015   Procedure: RIGHT CAROTID ENDARTERECTOMY WITH PATCH ANGIOPLASTY;  Surgeon: Elam Dutch, MD;  Location: [facility];  Service: Vascular;  Laterality: Right;  .  INGUINAL HERNIA REPAIR Left 02/05/2013   Procedure: HERNIA REPAIR INGUINAL ADULT;  Surgeon: Joyice Faster. Cornett, MD;  Location: [facility];  Service: General;  Laterality: Left;  . INGUINAL HERNIA REPAIR Left   . INSERTION OF MESH Left 02/05/2013   Procedure: INSERTION OF MESH;  Surgeon: Joyice Faster. Cornett, MD;  Location: [facility];  Service: General;  Laterality: Left;  . SHOULDER ARTHROSCOPY WITH ROTATOR CUFF REPAIR AND SUBACROMIAL DECOMPRESSION Left 05/01/2014   Procedure: LEFT ARTHROSCOPY SHOULDER SUBACROMIAL DECOMPRESSION,DISTAL CLAVICAL RESECTION AND ROTATOR CUFF REPAIR;  Surgeon: Marin Shutter, MD;  Location: [facility];  Service: Orthopedics;  Laterality: Left;  . TESTICLAR CYST EXCISION Left   . TONSILLECTOMY    . VASECTOMY     Past Medical History:  Diagnosis Date  . Arthritis    DDD- Lower Back  . Bradycardia   . CAP (community acquired pneumonia) 03/17/2015   "THAT'S WHAT THEY ARE THINKING; THEY ARE NOT SURE"  . Carotid artery occlusion   . Chronic lower back pain    DDD  . CKD (chronic kidney disease)    Dr. Corliss Parish  . Diverticulosis   . Enlarged prostate    takes Flomax daily  . History of blood transfusion    "probably when they did aortic aneurysm"  . History of colon polyps    benign  . Hyperlipidemia   . Hypertension    takes Amlodipine and Zebeta daily  . Insomnia    takes Melatonin nightly  . Joint pain   . 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     . Muscle spasm    takes Zanaflex daily as needed  . Peripheral edema    takes Furosemide daily  . Pneumonia    hx of   . Rheumatic fever 1946  . Rheumatoid arthritis ([place])   . Short-term memory loss    minimal  . Shortness of breath dyspnea    with exertion but lasts very short period of time  . TIA (transient ischemic attack)    x 2;takes Plavix daily  . Urinary frequency    takes Flomax daily  . Urinary urgency    BP 108/67   Pulse 60   Ht  5' 7.5" (1.715 m)   Wt 181 lb 12.8 oz (82.5 kg)   SpO2 93%   BMI 28.05 kg/m   Opioid Risk Score:   Fall Risk Score:  `1  Depression screen PHQ 2/9  Depression screen  [facility] 2/9 01/22/2018 10/19/2017 10/05/2017 09/25/2017 07/03/2017 09/08/2016 10/20/2015  Decreased Interest 0 0 0 0 0 0 0  Down, Depressed, Hopeless 0 0 0 0 0 0 0  PHQ - 2 Score 0 0 0 0 0 0 0  Some recent data might be hidden     Review of Systems  Constitutional: Negative.   HENT: Negative.   Eyes: Negative.   Respiratory: Negative.   Cardiovascular: Negative.   Gastrointestinal: Negative.   Endocrine: Negative.   Genitourinary: Positive for difficulty urinating.  Musculoskeletal: Positive for arthralgias, back pain, gait problem and myalgias.  Skin: Negative.   Allergic/Immunologic: Negative.   Neurological: Positive for weakness.  Hematological: Negative.   Psychiatric/Behavioral: Negative.   All other systems reviewed and are negative.      Objective:   Physical Exam  Constitutional: He is oriented to person, place, and time. He appears well-developed and well-nourished.  HENT:  Head: Normocephalic and atraumatic.  Neck: Normal range of motion. Neck supple.  Cardiovascular: Normal rate and regular rhythm.  Pulmonary/Chest: Effort normal and breath sounds normal.  Musculoskeletal:  Normal Muscle Bulk and Muscle Testing Reveals: Upper Extremities: Full ROM and Muscle Strength 5/5 Lumbar Paraspinal Tenderness: L-3-L-5 Lower Extremities: Full ROM and Muscle Strength 5/5 Left Lower Extremity Flexion Produces Pain into Left Patella Arises from chair slowly using cane for support Antalgic gait  Neurological: He is alert and oriented to person, place, and time.  Skin: Skin is warm and dry.  Psychiatric: He has a normal mood and affect. His behavior is normal.  Nursing note and vitals reviewed.         Assessment & Plan:  1. Lumbar Spondylosis/ Lumbar Stenosis:03/20/2018 Continue current medication  regime. Continue:No script given today. Oxycodone 7.5/325 mg one tablet twice daily as needed, may take an extra tablet when pain is moderate 10 days out of the month.for #70.03/20/2018 We will continue the opioid monitoring program, this consists of regular clinic visits, examinations, urine drug screen, pill counts as well as use of [place] Controlled Substance Reporting System. 2. ChronicBilateralKnee Pain: S/P Cortisone Injection by Dr Paulla Fore on 03/14/2018, note was reviewed. Orthopedist Following Dr. Paulla Fore. 03/19/2018  52minutes of face to face patient care time was spent during this visit. All questions were encouraged and answered.  F/U in 1 month

## 2018-03-26 ENCOUNTER — Other Ambulatory Visit: Payer: Self-pay | Admitting: Family Medicine

## 2018-04-04 ENCOUNTER — Other Ambulatory Visit: Payer: Self-pay | Admitting: Family Medicine

## 2018-04-13 ENCOUNTER — Other Ambulatory Visit: Payer: Self-pay | Admitting: Family Medicine

## 2018-04-16 ENCOUNTER — Encounter: Payer: Self-pay | Admitting: Registered Nurse

## 2018-04-16 ENCOUNTER — Other Ambulatory Visit: Payer: Self-pay

## 2018-04-16 ENCOUNTER — Telehealth: Payer: Self-pay | Admitting: Family Medicine

## 2018-04-16 ENCOUNTER — Encounter: Payer: Medicare HMO | Attending: Physical Medicine & Rehabilitation | Admitting: Registered Nurse

## 2018-04-16 VITALS — BP 121/66 | HR 54 | Ht 67.0 in | Wt 180.8 lb

## 2018-04-16 DIAGNOSIS — M25551 Pain in right hip: Secondary | ICD-10-CM | POA: Insufficient documentation

## 2018-04-16 DIAGNOSIS — G894 Chronic pain syndrome: Secondary | ICD-10-CM | POA: Insufficient documentation

## 2018-04-16 DIAGNOSIS — Z5181 Encounter for therapeutic drug level monitoring: Secondary | ICD-10-CM | POA: Diagnosis not present

## 2018-04-16 DIAGNOSIS — M1712 Unilateral primary osteoarthritis, left knee: Secondary | ICD-10-CM | POA: Diagnosis not present

## 2018-04-16 DIAGNOSIS — Z79891 Long term (current) use of opiate analgesic: Secondary | ICD-10-CM

## 2018-04-16 DIAGNOSIS — M47816 Spondylosis without myelopathy or radiculopathy, lumbar region: Secondary | ICD-10-CM | POA: Diagnosis not present

## 2018-04-16 DIAGNOSIS — M25562 Pain in left knee: Secondary | ICD-10-CM | POA: Diagnosis not present

## 2018-04-16 DIAGNOSIS — I69852 Hemiplegia and hemiparesis following other cerebrovascular disease affecting left dominant side: Secondary | ICD-10-CM | POA: Diagnosis not present

## 2018-04-16 DIAGNOSIS — G8929 Other chronic pain: Secondary | ICD-10-CM

## 2018-04-16 DIAGNOSIS — I739 Peripheral vascular disease, unspecified: Secondary | ICD-10-CM | POA: Diagnosis not present

## 2018-04-16 LAB — UNLABELED

## 2018-04-16 MED ORDER — OXYCODONE-ACETAMINOPHEN 7.5-325 MG PO TABS: 1.0000 | ORAL_TABLET | Freq: Two times a day (BID) | ORAL | 0 refills | Status: DC | PRN

## 2018-04-16 NOTE — Telephone Encounter (Signed)
See note  Copied from [place] [id number]. Topic: General - Inquiry >> Apr 16, 2018 11:07 AM Ahmed Prima L wrote: Reason for CRM: pt said that when he turns to the right really quick that the part behind his eye hurts. He said it does not do it all the time, just once in a while. He wants to know does he need to see Dr [name] or eye doctor? He said it has been going on about 2-3 weeks.

## 2018-04-16 NOTE — Progress Notes (Signed)
Subjective:    Patient ID: Kevin [name], male    DOB: 07-01-1940, 77 y.o.   MRN: [id number]  HPI: [name] is a 77 year old male who returns for follow up appointment for chronic pain and medication refill. He states his pain is located in his lower back. He rates his pain 7. His current exercise regime is walking.  Mr. Gelpi Morphine Equivalent is 26.25 MME. He is also prescribed Diazepam by Dr. [name] .We have discussed the black box warning of using opioids and benzodiazepines. I highlighted the dangers of using these drugs together and discussed the adverse events including respiratory suppression, overdose, cognitive impairment and importance of compliance with current regimen. We will continue to monitor and adjust as indicated.   Last Oral Swab was Performed on 12/22/2017, it was consistent.  Oral Swab was Performed today.    Pain Inventory Average Pain 6 Pain Right Now 7 My pain is intermittent, dull, stabbing and aching  In the last 24 hours, has pain interfered with the following? General activity 8 Relation with others 8 Enjoyment of life 9 What TIME of day is your pain at its worst? daytime night Sleep (in general) Fair  Pain is worse with: walking and standing Pain improves with: pacing activities and medication Relief from Meds: 6  Mobility walk with assistance use a cane ability to climb steps?  no do you drive?  yes  Function retired  Neuro/Psych spasms  Prior Studies Any changes since last visit?  no  Physicians involved in your care Any changes since last visit?  no   Family History  Problem Relation Age of Onset  . Parkinsonism Father   . Dementia Father   . Cancer Father        Throat  . Colon cancer Mother        died in 24s  . Cancer Mother        Colon   Social History   Socioeconomic History  . Marital status: Married    Spouse name: Not on file  . Number of children: Not on file  . Years of education: Not on file  .  Highest education level: Not on file  Occupational History  . Occupation: retired    Comment: maintenance; electrician  Social Needs  . Financial resource strain: Not on file  . Food insecurity:    Worry: Not on file    Inability: Not on file  . Transportation needs:    Medical: Not on file    Non-medical: Not on file  Tobacco Use  . Smoking status: Former Smoker    Packs/day: 2.00    Years: 28.00    Pack years: 56.00    Types: Cigarettes    Last attempt to quit: 05/31/1987    Years since quitting: 30.8  . Smokeless tobacco: Never Used  . Tobacco comment: quit smoking "in my 60's"  Substance and Sexual Activity  . Alcohol use: Yes    Alcohol/week: 0.0 standard drinks    Comment: rare, mikes hard lemonade  . Drug use: No    Types: Marijuana    Comment: hx of-4-5 yrs ago  . Sexual activity: Not Currently  Lifestyle  . Physical activity:    Days per week: Not on file    Minutes per session: Not on file  . Stress: Not on file  Relationships  . Social connections:    Talks on phone: Not on file    Gets together: Not  on file    Attends religious service: Not on file    Active member of club or organization: Not on file    Attends meetings of clubs or organizations: Not on file    Relationship status: Not on file  Other Topics Concern  . Not on file  Social History Narrative   Married 33 years in 2015. Lived together for 12 years. 2 boys from first wife. 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       Hobbies: yardwork, [place] barn, woodwork, Orthoptist   Past Surgical History:  Procedure Laterality Date  . ABDOMINAL AORTIC ANEURYSM REPAIR  2010  . CAROTID ENDARTERECTOMY Left 09/20/2004  . CATARACT EXTRACTION, BILATERAL     2016-2017  . COLONOSCOPY    . ENDARTERECTOMY Right 10/12/2015   Procedure: RIGHT CAROTID ENDARTERECTOMY WITH PATCH ANGIOPLASTY;   Surgeon: Elam Dutch, MD;  Location: [facility];  Service: Vascular;  Laterality: Right;  . INGUINAL HERNIA REPAIR Left 02/05/2013   Procedure: HERNIA REPAIR INGUINAL ADULT;  Surgeon: Joyice Faster. Cornett, MD;  Location: [facility];  Service: General;  Laterality: Left;  . INGUINAL HERNIA REPAIR Left   . INSERTION OF MESH Left 02/05/2013   Procedure: INSERTION OF MESH;  Surgeon: Joyice Faster. Cornett, MD;  Location: [facility];  Service: General;  Laterality: Left;  . SHOULDER ARTHROSCOPY WITH ROTATOR CUFF REPAIR AND SUBACROMIAL DECOMPRESSION Left 05/01/2014   Procedure: LEFT ARTHROSCOPY SHOULDER SUBACROMIAL DECOMPRESSION,DISTAL CLAVICAL RESECTION AND ROTATOR CUFF REPAIR;  Surgeon: Marin Shutter, MD;  Location: [facility];  Service: Orthopedics;  Laterality: Left;  . TESTICLAR CYST EXCISION Left   . TONSILLECTOMY    . VASECTOMY     Past Medical History:  Diagnosis Date  . Arthritis    DDD- Lower Back  . Bradycardia   . CAP (community acquired pneumonia) 03/17/2015   "THAT'S WHAT THEY ARE THINKING; THEY ARE NOT SURE"  . Carotid artery occlusion   . Chronic lower back pain    DDD  . CKD (chronic kidney disease)    Dr. Corliss Parish  . Diverticulosis   . Enlarged prostate    takes Flomax daily  . History of blood transfusion    "probably when they did aortic aneurysm"  . History of colon polyps    benign  . Hyperlipidemia   . Hypertension    takes Amlodipine and Zebeta daily  . Insomnia    takes Melatonin nightly  . Joint pain   . 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     . Muscle spasm    takes Zanaflex daily as needed  . Peripheral edema    takes Furosemide daily  . Pneumonia    hx of   . Rheumatic fever 1946  . Rheumatoid arthritis ([place])   . Short-term memory loss    minimal  . Shortness of breath dyspnea    with exertion but lasts very short period of time  . TIA (transient ischemic attack)    x 2;takes Plavix daily  .  Urinary frequency    takes Flomax daily  . Urinary urgency    BP 121/66   Pulse (!) 45   Ht 5\' 7"  (1.702 m)   Wt 180 lb 12.8 oz (82 kg)   SpO2 95%   BMI 28.32 kg/m  Opioid Risk Score:   Fall Risk Score:  `1  Depression screen PHQ 2/9  Depression screen [facility] 2/9 04/16/2018 01/22/2018 10/19/2017 10/05/2017 09/25/2017 07/03/2017 09/08/2016  Decreased Interest 0 0 0 0 0 0 0  Down, Depressed, Hopeless 0 0 0 0 0 0 0  PHQ - 2 Score 0 0 0 0 0 0 0  Some recent data might be hidden    Review of Systems  Constitutional: Negative.   HENT: Negative.   Eyes: Negative.   Respiratory: Negative.   Cardiovascular: Negative.   Gastrointestinal: Negative.   Endocrine: Negative.   Genitourinary: Negative.   Musculoskeletal: Negative.   Skin: Negative.   Allergic/Immunologic: Negative.   Neurological: Negative.   Hematological: Negative.   Psychiatric/Behavioral: Negative.   All other systems reviewed and are negative.      Objective:   Physical Exam  Constitutional: He is oriented to person, place, and time. He appears well-developed and well-nourished.  HENT:  Head: Normocephalic and atraumatic.  Neck: Normal range of motion. Neck supple.  Cardiovascular: Normal rate and regular rhythm.  Pulmonary/Chest: Effort normal and breath sounds normal.  Musculoskeletal:  Normal Muscle Bulk and Muscle Testing Reveals: Upper Extremities: Full ROM and Muscle Strength 5/5 Lumbar Paraspinal Tenderness: L-4-L-5 Lower Extremities: Full ROM and Muscle Strength 5/5 Arises from chair with ease using cane for support Narrow Based gait  Neurological: He is alert and oriented to person, place, and time.  Skin: Skin is warm and dry.  Psychiatric: He has a normal mood and affect. His behavior is normal.  Nursing note and vitals reviewed.         Assessment & Plan:  1. Lumbar Spondylosis/ Lumbar Stenosis:04/16/2018 Continue current medication regime. Refilled: Oxycodone 7.5/325 mg one tablet twice  daily as needed, may take an extra tablet when pain is moderate 10 days out of the month.for #70.04/16/2018 We will continue the opioid monitoring program, this consists of regular clinic visits, examinations, urine drug screen, pill counts as well as use of [place] Controlled Substance Reporting System. 2. ChronicBilateralKnee Pain: S/P Cortisone Injection by Dr Paulla Fore on 03/14/2018, note was reviewed. Orthopedist Following Dr. Paulla Fore. 04/16/2018  55minutes of face to face patient care time was spent during this visit. All questions were encouraged and answered.  F/U in 1 month

## 2018-04-17 NOTE — Telephone Encounter (Addendum)
Relation to pt: self Call back number: [phone]    Reason for call:  Patient checking on the status of message below, patient declined appointment and stated he would like to hear from Dr. [name] first. Please advise

## 2018-04-17 NOTE — Telephone Encounter (Signed)
I think it is reasonable to start with his eye doctor and see [place] if eye doctor recommends.

## 2018-04-19 NOTE — Telephone Encounter (Signed)
Called and spoke with patient. He states that since he has called he has not had any further eye pain. I explained that it is still a good idea to have his eye examined. Patient agreed and will call to get an appointment and let our office know if he needs any further assistance.

## 2018-04-20 ENCOUNTER — Telehealth: Payer: Self-pay | Admitting: *Deleted

## 2018-04-20 LAB — DRUG TOX MONITOR 1 W/CONF, ORAL FLD
ALPRAZOLAM: NEGATIVE ng/mL (ref <0.50)
Amphetamines: NEGATIVE ng/mL (ref <10)
Barbiturates: NEGATIVE ng/mL (ref <10)
Benzodiazepines: POSITIVE ng/mL — AB (ref <0.50)
Buprenorphine: NEGATIVE ng/mL (ref <0.10)
CHLORDIAZEPOXIDE: NEGATIVE ng/mL (ref <0.50)
CLONAZEPAM: NEGATIVE ng/mL (ref <0.50)
COCAINE: NEGATIVE ng/mL (ref <5.0)
CODEINE: NEGATIVE ng/mL (ref <2.5)
DIAZEPAM: 3.77 ng/mL — AB (ref <0.50)
Dihydrocodeine: NEGATIVE ng/mL (ref <2.5)
FLUNITRAZEPAM: NEGATIVE ng/mL (ref <0.50)
FLURAZEPAM: NEGATIVE ng/mL (ref <0.50)
Fentanyl: NEGATIVE ng/mL (ref <0.10)
HYDROMORPHONE: NEGATIVE ng/mL (ref <2.5)
Heroin Metabolite: NEGATIVE ng/mL (ref <1.0)
Heroin Metabolite: NEGATIVE ng/mL (ref <1.0)
Hydrocodone: NEGATIVE ng/mL (ref <2.5)
Lorazepam: NEGATIVE ng/mL (ref <0.50)
MARIJUANA: NEGATIVE ng/mL (ref <2.5)
MDMA: NEGATIVE ng/mL (ref <10)
MEPROBAMATE: NEGATIVE ng/mL (ref <2.5)
METHADONE: NEGATIVE ng/mL (ref <5.0)
MORPHINE: NEGATIVE ng/mL (ref <2.5)
Midazolam: NEGATIVE ng/mL (ref <0.50)
NORDIAZEPAM: 5.57 ng/mL — AB (ref <0.50)
NORHYDROCODONE: NEGATIVE ng/mL (ref <2.5)
NOROXYCODONE: 13.7 ng/mL — AB (ref <2.5)
Nicotine Metabolite: NEGATIVE ng/mL (ref <5.0)
OXAZEPAM: NEGATIVE ng/mL (ref <0.50)
OXYCODONE: 177.2 ng/mL — AB (ref <2.5)
Opiates: POSITIVE ng/mL — AB (ref <2.5)
Oxymorphone: NEGATIVE ng/mL (ref <2.5)
Phencyclidine: NEGATIVE ng/mL (ref <10)
TEMAZEPAM: NEGATIVE ng/mL (ref <0.50)
Tapentadol: NEGATIVE ng/mL (ref <5.0)
Tramadol: NEGATIVE ng/mL (ref <5.0)
Triazolam: NEGATIVE ng/mL (ref <0.50)
Zolpidem: NEGATIVE ng/mL (ref <5.0)

## 2018-04-20 LAB — PAT ID TIQ DOC: Test Affected: 93976

## 2018-04-20 LAB — DRUG TOX ALC METAB W/CON, ORAL FLD: Alcohol Metabolite: NEGATIVE ng/mL (ref <25)

## 2018-04-20 NOTE — Telephone Encounter (Signed)
Oral swab drug screen was consistent for prescribed medications.  ?

## 2018-04-23 DIAGNOSIS — H5711 Ocular pain, right eye: Secondary | ICD-10-CM | POA: Diagnosis not present

## 2018-05-14 ENCOUNTER — Encounter: Payer: Self-pay | Admitting: Sports Medicine

## 2018-05-14 ENCOUNTER — Ambulatory Visit (INDEPENDENT_AMBULATORY_CARE_PROVIDER_SITE_OTHER): Payer: Medicare HMO | Admitting: Sports Medicine

## 2018-05-14 VITALS — BP 160/80 | HR 63 | Ht 67.0 in | Wt 180.2 lb

## 2018-05-14 DIAGNOSIS — M25561 Pain in right knee: Secondary | ICD-10-CM | POA: Diagnosis not present

## 2018-05-14 DIAGNOSIS — M160 Bilateral primary osteoarthritis of hip: Secondary | ICD-10-CM

## 2018-05-14 DIAGNOSIS — M25551 Pain in right hip: Secondary | ICD-10-CM

## 2018-05-14 DIAGNOSIS — M25562 Pain in left knee: Secondary | ICD-10-CM

## 2018-05-14 DIAGNOSIS — M47816 Spondylosis without myelopathy or radiculopathy, lumbar region: Secondary | ICD-10-CM

## 2018-05-14 DIAGNOSIS — M25559 Pain in unspecified hip: Secondary | ICD-10-CM | POA: Diagnosis not present

## 2018-05-14 DIAGNOSIS — G8929 Other chronic pain: Secondary | ICD-10-CM

## 2018-05-14 DIAGNOSIS — M170 Bilateral primary osteoarthritis of knee: Secondary | ICD-10-CM | POA: Diagnosis not present

## 2018-05-14 DIAGNOSIS — G894 Chronic pain syndrome: Secondary | ICD-10-CM | POA: Diagnosis not present

## 2018-05-14 DIAGNOSIS — M25552 Pain in left hip: Secondary | ICD-10-CM

## 2018-05-14 MED ORDER — NITROGLYCERIN 0.2 MG/HR TD PT24: MEDICATED_PATCH | TRANSDERMAL | 1 refills | Status: DC

## 2018-05-14 NOTE — Progress Notes (Signed)
PROCEDURE NOTE: THERAPEUTIC EXERCISES (97110) 15 minutes spent for Therapeutic exercises as below and as referenced in the AVS.  This included exercises focusing on stretching, strengthening, with significant focus on eccentric aspects.   Proper technique shown and discussed handout in great detail with ATC.  All questions were discussed and answered.   Long term goals include an improvement in range of motion, strength, endurance as well as avoiding reinjury. Frequency of visits is one time as determined during today's  office visit. Frequency of exercises to be performed is as per handout.  EXERCISES REVIEWED: Hip ABduction strengthening with focus on Glute Medius Recruitment Standing hip abduction.

## 2018-05-14 NOTE — Progress Notes (Signed)
[name]. [place], [place] at [facility] [phone]  TOPHER BUENAVENTURA - 77 y.o. male MRN [id number]  Date of birth: 08-24-1940  Visit Date:   PCP: Marin Olp, MD   Referred by: Marin Olp, MD   SUBJECTIVE:   Chief Complaint  Patient presents with  . f/u B knee pain    Knee pain has improved since receiving steroid inj. Still has bouts of severe pain.   . B hip pain, R>L     HPI: Patient is here for follow-up of bilateral knee pain, low back pain and now right greater than left hip pain.  Reports the pain is intermittent and ranging from moderate to severe.  His most focal complaints today are the right lateral hip pain.  Denies any groin pain.  Overall he does feel better than when he initially came in and feels like the injections in his knees have been helpful.  Does occasionally get pain radiating from his low back into his bilateral hips knees and has occasional leg cramps.  He does have underlying known PAD.  Symptoms are most significant with sitting for prolonged periods.  He has been using a cane underwent knee injections on March 14, 2018 Depo-Medrol.  Currently takes gabapentin oxycodone diazepam tizanidine and CBD oil through pain management.  REVIEW OF SYSTEMS: He does have significant pain at night but this is unchanged.  He has a hard time laying directly on his right side.  He does sit on his wallet on a daily basis has not associated this with any pain.  He does report having a fall approximately a month ago landing onto his left side with no significant residual effects.  Occasionally gets dizzy and has presyncopal episodes but this is minimal.  His lower extremity edema is unchanged.  HISTORY:  Prior history reviewed and updated per electronic medical record.  Social History   Occupational History  . Occupation: retired    Comment: maintenance; electrician  Tobacco Use  . Smoking status: Former Smoker   Packs/day: 2.00    Years: 28.00    Pack years: 56.00    Types: Cigarettes    Last attempt to quit: 05/31/1987    Years since quitting: 30.9  . Smokeless tobacco: Never Used  . Tobacco comment: quit smoking "in my 61's"  Substance and Sexual Activity  . Alcohol use: Yes    Alcohol/week: 0.0 standard drinks    Comment: rare, mikes hard lemonade  . Drug use: No    Types: Marijuana    Comment: hx of-4-5 yrs ago  . Sexual activity: Not Currently   Social History   Social History Narrative   Married 33 years in 2015. Lived together for 12 years. 2 boys from first wife. 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       Hobbies: yardwork, [place] barn, woodwork, collect knive      DATA OBTAINED & REVIEWED:  Recent Labs    09/05/17 1705  CALCIUM 8.9  AST 16  ALT 16   No problems updated. No specialty comments available.  OBJECTIVE:  VS:  HT:5\' 7"  (170.2 cm)   WT:180 lb 3.2 oz (81.7 kg)  BMI:28.22    BP: (!) 160/80  HR:63bpm  TEMP: ( )  RESP:95 %   PHYSICAL EXAM: Adult  male in no acute distress.  He is alert and appropriately interactive. He has a negative straight leg raise bilaterally.  He has a flexion contracture of 5 to 10 degrees on each side.  He has good hip internal and external range of motion.  Focally tender over the right greater trochanter and right IT band.  Hip abduction strength is 5 out of 5 bilaterally with TFL predominant recruitment pattern.  Bilateral knees have generalized osteoarthritic bossing.  Good flexion and extension strength with no significant effusion.  Minimal synovitis.   ASSESSMENT  1. Greater trochanteric pain syndrome   2. Chronic pain of both knees   3. Primary osteoarthritis of both knees   4. Lumbar spondylosis   5. Chronic pain syndrome   6. Pain of both hip joints   7. Primary osteoarthritis of both hips      PLAN:  Pertinent additional documentation may be included in corresponding procedure notes, imaging studies, problem based documentation and patient instructions.  Procedures:  Home Therapeutic exercises prescribed per procedure note.  Medications:   Meds ordered this encounter  Medications  . nitroGLYCERIN (NITRODUR - DOSED IN MG/24 HR) 0.2 mg/hr patch    Sig: Place 1/4 to 1/2 of a patch over affected region. Remove and replace once daily.  Slightly alter skin placement daily    Dispense:  30 patch    Refill:  1    For musculoskeletal purposes.  Okay to cut patch.     Discussion/Instructions: No problem-specific Assessment & Plan notes found for this encounter. THERAPEUTIC EXERCISE: Discussed the foundation of treatment for this condition is physical therapy and/or daily (5-6 days/week) therapeutic exercises, focusing on core strengthening, coordination, neuromuscular control/reeducation.  Home Therapeutic exercises prescribed today per procedure note. and Continue previously prescribed home exercise program.  TENDINOPATHY - Discussed that the anticipated amount of time for healing is 12- 24 weeks for Tendinopathic changes.  Emphasized the importance of improving blood flow as well as eccentric loading of the tendon. NITRO PROTOCOL - Discussed options with the patient today including biologic treatment with topical nitroglycerin. Patient has no contraindications & understands the risks, benefits and intentions of treatment. Emphasized the importance of rotating sites as well as appropriate and expected adverse reactions including orthostasis, headache, adhesive sensitivity.  Discussed red flag symptoms that warrant earlier emergent evaluation and patient voices understanding. Activity modifications and the importance of avoiding exacerbating activities (limiting pain to no more than a 4 / 10 during or following activity) recommended and discussed. Overall seems seem to be doing fairly  well from the injections.  We will plan to have him follow-up in 6 weeks for the hip and if any persistent knee symptoms can consider repeat injections at that time. If any lack of improvement: consider further diagnostic evaluation with MSK ultrasound and/or MRI of the lumbar spine/hips and consider referral to Physical Therapy  At follow up will plan : to consider repeat corticosteroid injections Return in about 6 weeks (around 06/25/2018) for repeat clinical exam, consider diagnostic ultrasound.          Gerda Diss, [facility] Physician

## 2018-05-14 NOTE — Patient Instructions (Signed)
Nitroglycerin Protocol   Apply 1/4 nitroglycerin patch to affected area daily.  Change position of patch within the affected area every 24 hours.  You may experience a headache during the first 1-2 weeks of using the patch, these should subside.  If you experience headaches after beginning nitroglycerin patch treatment, you may take your preferred over the counter pain reliever.  Another side effect of the nitroglycerin patch is skin irritation or rash related to patch adhesive.  Please notify our office if you develop more severe headaches or rash, and stop the patch.  Tendon healing with nitroglycerin patch may require 12 to 24 weeks depending on the extent of injury.  Men should not use if taking Viagra, Cialis, or Levitra.   Do not use if you have migraines or rosacea.    Please perform the exercise program that we have prepared for you and gone over in detail on a daily basis.  In addition to the handout you were provided you can access your program through: [email or web address]   Your unique program code is:  [email or web address]

## 2018-05-15 ENCOUNTER — Encounter: Payer: Self-pay | Admitting: Registered Nurse

## 2018-05-15 ENCOUNTER — Encounter: Payer: Medicare HMO | Attending: Physical Medicine & Rehabilitation | Admitting: Registered Nurse

## 2018-05-15 VITALS — BP 133/70 | HR 60 | Resp 14 | Ht 67.0 in | Wt 180.0 lb

## 2018-05-15 DIAGNOSIS — Z5181 Encounter for therapeutic drug level monitoring: Secondary | ICD-10-CM

## 2018-05-15 DIAGNOSIS — G8929 Other chronic pain: Secondary | ICD-10-CM

## 2018-05-15 DIAGNOSIS — M47816 Spondylosis without myelopathy or radiculopathy, lumbar region: Secondary | ICD-10-CM | POA: Diagnosis not present

## 2018-05-15 DIAGNOSIS — M1712 Unilateral primary osteoarthritis, left knee: Secondary | ICD-10-CM | POA: Insufficient documentation

## 2018-05-15 DIAGNOSIS — G894 Chronic pain syndrome: Secondary | ICD-10-CM

## 2018-05-15 DIAGNOSIS — Z79899 Other long term (current) drug therapy: Secondary | ICD-10-CM

## 2018-05-15 DIAGNOSIS — M25562 Pain in left knee: Secondary | ICD-10-CM | POA: Diagnosis not present

## 2018-05-15 DIAGNOSIS — I739 Peripheral vascular disease, unspecified: Secondary | ICD-10-CM | POA: Diagnosis not present

## 2018-05-15 DIAGNOSIS — M25561 Pain in right knee: Secondary | ICD-10-CM

## 2018-05-15 DIAGNOSIS — M25551 Pain in right hip: Secondary | ICD-10-CM | POA: Diagnosis not present

## 2018-05-15 MED ORDER — OXYCODONE-ACETAMINOPHEN 7.5-325 MG PO TABS: 1.0000 | ORAL_TABLET | Freq: Two times a day (BID) | ORAL | 0 refills | Status: DC | PRN

## 2018-05-15 NOTE — Progress Notes (Signed)
Subjective:    Patient ID: Kevin [name], male    DOB: 1940/09/25, 77 y.o.   MRN: [id number]  HPI: Kevin [name] is a 77 y.o. male who returns for follow up appointment for chronic pain and medication refill. He states his  pain is located in his lower back  right hip and bilateral knee pain.He rates his pain 6.His current exercise regime is walking.  [name] Morphine equivalent is 26.25 MME. He is also prescribed Diazepam by Dr. [name]. We have discussed the black box warning of using opioids and benzodiazepines. I highlighted the dangers of using these drugs together and discussed the adverse events including respiratory suppression, overdose, cognitive impairment and importance of compliance with current regimen. We will continue to monitor and adjust as indicated.   Last Oral Swab was Performed on 04/16/2018, it was consistent.   Pain Inventory Average Pain 5 Pain Right Now 6 My pain is aching  In the last 24 hours, has pain interfered with the following? General activity 6 Relation with others 3 Enjoyment of life 6 What TIME of day is your pain at its worst? evening Sleep (in general) Fair  Pain is worse with: walking, bending and standing Pain improves with: medication Relief from Meds: 7  Mobility walk with assistance use a cane how many minutes can you walk? 10 ability to climb steps?  yes do you drive?  yes  Function retired Do you have any goals in this area?  no  Neuro/Psych tremor trouble walking  Prior Studies Any changes since last visit?  no  Physicians involved in your care Any changes since last visit?  no   Family History  Problem Relation Age of Onset  . Parkinsonism Father   . Dementia Father   . Cancer Father        Throat  . Colon cancer Mother        died in 38s  . Cancer Mother        Colon   Social History   Socioeconomic History  . Marital status: Married    Spouse name: Not on file  . Number of children: Not on file  . Years  of education: Not on file  . Highest education level: Not on file  Occupational History  . Occupation: retired    Comment: maintenance; electrician  Social Needs  . Financial resource strain: Not on file  . Food insecurity:    Worry: Not on file    Inability: Not on file  . Transportation needs:    Medical: Not on file    Non-medical: Not on file  Tobacco Use  . Smoking status: Former Smoker    Packs/day: 2.00    Years: 28.00    Pack years: 56.00    Types: Cigarettes    Last attempt to quit: 05/31/1987    Years since quitting: 30.9  . Smokeless tobacco: Never Used  . Tobacco comment: quit smoking "in my 36's"  Substance and Sexual Activity  . Alcohol use: Yes    Alcohol/week: 0.0 standard drinks    Comment: rare, mikes hard lemonade  . Drug use: No    Types: Marijuana    Comment: hx of-4-5 yrs ago  . Sexual activity: Not Currently  Lifestyle  . Physical activity:    Days per week: Not on file    Minutes per session: Not on file  . Stress: Not on file  Relationships  . Social connections:    Talks on phone:  Not on file    Gets together: Not on file    Attends religious service: Not on file    Active member of club or organization: Not on file    Attends meetings of clubs or organizations: Not on file    Relationship status: Not on file  Other Topics Concern  . Not on file  Social History Narrative   Married 33 years in 2015. Lived together for 12 years. 2 boys from first wife. 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       Hobbies: yardwork, [place] barn, woodwork, Orthoptist   Past Surgical History:  Procedure Laterality Date  . ABDOMINAL AORTIC ANEURYSM REPAIR  2010  . CAROTID ENDARTERECTOMY Left 09/20/2004  . CATARACT EXTRACTION, BILATERAL     2016-2017  . COLONOSCOPY    . ENDARTERECTOMY Right 10/12/2015   Procedure: RIGHT CAROTID  ENDARTERECTOMY WITH PATCH ANGIOPLASTY;  Surgeon: Elam Dutch, MD;  Location: [facility];  Service: Vascular;  Laterality: Right;  . INGUINAL HERNIA REPAIR Left 02/05/2013   Procedure: HERNIA REPAIR INGUINAL ADULT;  Surgeon: Joyice Faster. Cornett, MD;  Location: [facility];  Service: General;  Laterality: Left;  . INGUINAL HERNIA REPAIR Left   . INSERTION OF MESH Left 02/05/2013   Procedure: INSERTION OF MESH;  Surgeon: Joyice Faster. Cornett, MD;  Location: [facility];  Service: General;  Laterality: Left;  . SHOULDER ARTHROSCOPY WITH ROTATOR CUFF REPAIR AND SUBACROMIAL DECOMPRESSION Left 05/01/2014   Procedure: LEFT ARTHROSCOPY SHOULDER SUBACROMIAL DECOMPRESSION,DISTAL CLAVICAL RESECTION AND ROTATOR CUFF REPAIR;  Surgeon: Marin Shutter, MD;  Location: [facility];  Service: Orthopedics;  Laterality: Left;  . TESTICLAR CYST EXCISION Left   . TONSILLECTOMY    . VASECTOMY     Past Medical History:  Diagnosis Date  . Arthritis    DDD- Lower Back  . Bradycardia   . CAP (community acquired pneumonia) 03/17/2015   "THAT'S WHAT THEY ARE THINKING; THEY ARE NOT SURE"  . Carotid artery occlusion   . Chronic lower back pain    DDD  . CKD (chronic kidney disease)    Dr. Corliss Parish  . Diverticulosis   . Enlarged prostate    takes Flomax daily  . History of blood transfusion    "probably when they did aortic aneurysm"  . History of colon polyps    benign  . Hyperlipidemia   . Hypertension    takes Amlodipine and Zebeta daily  . Insomnia    takes Melatonin nightly  . Joint pain   . 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     . Muscle spasm    takes Zanaflex daily as needed  . Peripheral edema    takes Furosemide daily  . Pneumonia    hx of   . Rheumatic fever 1946  . Rheumatoid arthritis ([place])   . Short-term memory loss    minimal  . Shortness of breath dyspnea    with exertion but lasts very short period of time  . TIA (transient ischemic  attack)    x 2;takes Plavix daily  . Urinary frequency    takes Flomax daily  . Urinary urgency    BP 133/70   Pulse (!) 49   Resp 14   Ht 5\' 7"  (1.702 m)   Wt 180 lb (81.6  kg)   SpO2 97%   BMI 28.19 kg/m   Opioid Risk Score:   Fall Risk Score:  `1  Depression screen PHQ 2/9  Depression screen [facility] 2/9 04/16/2018 01/22/2018 10/19/2017 10/05/2017 09/25/2017 07/03/2017 09/08/2016  Decreased Interest 0 0 0 0 0 0 0  Down, Depressed, Hopeless 0 0 0 0 0 0 0  PHQ - 2 Score 0 0 0 0 0 0 0  Some recent data might be hidden    Review of Systems  Constitutional: Negative.   HENT: Negative.   Eyes: Negative.   Respiratory: Negative.   Cardiovascular: Negative.   Gastrointestinal: Negative.   Endocrine: Negative.   Genitourinary: Negative.   Musculoskeletal: Positive for arthralgias, back pain and gait problem.  Skin: Negative.   Allergic/Immunologic: Negative.   Psychiatric/Behavioral: Negative.   All other systems reviewed and are negative.      Objective:   Physical Exam Vitals signs and nursing note reviewed.  Constitutional:      Appearance: Normal appearance.  Neck:     Musculoskeletal: Normal range of motion and neck supple.  Cardiovascular:     Rate and Rhythm: Normal rate and regular rhythm.     Pulses: Normal pulses.     Heart sounds: Normal heart sounds.  Pulmonary:     Effort: Pulmonary effort is normal.     Breath sounds: Normal breath sounds.  Musculoskeletal:     Comments: Normal Muscle Bulk and Muscle Testing Reveals:  Upper Extremities: Full ROM and Muscle Strength 5/5  Lumbar Paraspinal Tenderness: L-4-L-5 Right Greater Trochanter Tenderness Lower Extremities: Full ROM and Muscle Strength 5/5 Arises from Table with ease using cane for support Narrow Based Gait   Skin:    General: Skin is warm and dry.  Neurological:     Mental Status: He is alert and oriented to person, place, and time.  Psychiatric:        Mood and Affect: Mood normal.         Behavior: Behavior normal.           Assessment & Plan:  1. Lumbar Spondylosis/ Lumbar Stenosis:05/15/2018 Continue current medication regime. Refilled:Oxycodone 7.5/325 mg one tablet twice daily as needed, may take an extra tablet when pain is moderate 10 days out of the month.for #70.05/15/2018 We will continue the opioid monitoring program, this consists of regular clinic visits, examinations, urine drug screen, pill counts as well as use of [place] Controlled Substance Reporting System. 2. ChronicBilateralKnee Pain:S/P Cortisone Injection by Dr Paulla Fore on 03/14/2018, note was reviewed.Orthopedist Following Dr. Paulla Fore.04/16/2018 3. Right Hip Pain: Orthopedist Following. He was prescribed Nitroglycerin Patch.   2minutes of face to face patient care time was spent during this visit. All questions were encouraged and answered.  F/U in 1 month

## 2018-05-18 ENCOUNTER — Other Ambulatory Visit: Payer: Self-pay | Admitting: Family Medicine

## 2018-06-19 ENCOUNTER — Inpatient Hospital Stay (HOSPITAL_COMMUNITY)
Admission: EM | Admit: 2018-06-19 | Discharge: 2018-07-04 | DRG: 234 | Disposition: A | Discharge status: Home Health | Payer: Medicare HMO | Attending: Thoracic Surgery (Cardiothoracic Vascular Surgery) | Admitting: Thoracic Surgery (Cardiothoracic Vascular Surgery)

## 2018-06-19 ENCOUNTER — Other Ambulatory Visit: Payer: Self-pay

## 2018-06-19 ENCOUNTER — Emergency Department (HOSPITAL_COMMUNITY): Payer: Medicare HMO

## 2018-06-19 ENCOUNTER — Encounter (HOSPITAL_COMMUNITY): Payer: Self-pay | Admitting: Emergency Medicine

## 2018-06-19 ENCOUNTER — Ambulatory Visit: Payer: Self-pay

## 2018-06-19 DIAGNOSIS — Z820 Family history of epilepsy and other diseases of the nervous system: Secondary | ICD-10-CM | POA: Diagnosis not present

## 2018-06-19 DIAGNOSIS — Z01810 Encounter for preprocedural cardiovascular examination: Secondary | ICD-10-CM | POA: Diagnosis not present

## 2018-06-19 DIAGNOSIS — N183 Chronic kidney disease, stage 3 (moderate): Secondary | ICD-10-CM | POA: Diagnosis not present

## 2018-06-19 DIAGNOSIS — I5030 Unspecified diastolic (congestive) heart failure: Secondary | ICD-10-CM | POA: Diagnosis present

## 2018-06-19 DIAGNOSIS — I5032 Chronic diastolic (congestive) heart failure: Secondary | ICD-10-CM | POA: Diagnosis present

## 2018-06-19 DIAGNOSIS — R7303 Prediabetes: Secondary | ICD-10-CM | POA: Diagnosis not present

## 2018-06-19 DIAGNOSIS — I25810 Atherosclerosis of coronary artery bypass graft(s) without angina pectoris: Secondary | ICD-10-CM | POA: Diagnosis not present

## 2018-06-19 DIAGNOSIS — M069 Rheumatoid arthritis, unspecified: Secondary | ICD-10-CM | POA: Diagnosis present

## 2018-06-19 DIAGNOSIS — E7800 Pure hypercholesterolemia, unspecified: Secondary | ICD-10-CM | POA: Diagnosis present

## 2018-06-19 DIAGNOSIS — I6529 Occlusion and stenosis of unspecified carotid artery: Secondary | ICD-10-CM | POA: Diagnosis present

## 2018-06-19 DIAGNOSIS — Z951 Presence of aortocoronary bypass graft: Secondary | ICD-10-CM | POA: Diagnosis not present

## 2018-06-19 DIAGNOSIS — D649 Anemia, unspecified: Secondary | ICD-10-CM | POA: Diagnosis present

## 2018-06-19 DIAGNOSIS — R0789 Other chest pain: Secondary | ICD-10-CM

## 2018-06-19 DIAGNOSIS — Z7902 Long term (current) use of antithrombotics/antiplatelets: Secondary | ICD-10-CM

## 2018-06-19 DIAGNOSIS — M1990 Unspecified osteoarthritis, unspecified site: Secondary | ICD-10-CM | POA: Diagnosis present

## 2018-06-19 DIAGNOSIS — I25110 Atherosclerotic heart disease of native coronary artery with unstable angina pectoris: Secondary | ICD-10-CM | POA: Diagnosis present

## 2018-06-19 DIAGNOSIS — I209 Angina pectoris, unspecified: Secondary | ICD-10-CM | POA: Insufficient documentation

## 2018-06-19 DIAGNOSIS — J90 Pleural effusion, not elsewhere classified: Secondary | ICD-10-CM | POA: Diagnosis not present

## 2018-06-19 DIAGNOSIS — I208 Other forms of angina pectoris: Secondary | ICD-10-CM | POA: Diagnosis not present

## 2018-06-19 DIAGNOSIS — Z86010 Personal history of colonic polyps: Secondary | ICD-10-CM

## 2018-06-19 DIAGNOSIS — R05 Cough: Secondary | ICD-10-CM | POA: Diagnosis not present

## 2018-06-19 DIAGNOSIS — Z800 Family history of malignant neoplasm of digestive organs: Secondary | ICD-10-CM | POA: Diagnosis not present

## 2018-06-19 DIAGNOSIS — J9811 Atelectasis: Secondary | ICD-10-CM | POA: Diagnosis not present

## 2018-06-19 DIAGNOSIS — I480 Paroxysmal atrial fibrillation: Secondary | ICD-10-CM | POA: Diagnosis not present

## 2018-06-19 DIAGNOSIS — Z8679 Personal history of other diseases of the circulatory system: Secondary | ICD-10-CM

## 2018-06-19 DIAGNOSIS — Z8673 Personal history of transient ischemic attack (TIA), and cerebral infarction without residual deficits: Secondary | ICD-10-CM

## 2018-06-19 DIAGNOSIS — I10 Essential (primary) hypertension: Secondary | ICD-10-CM | POA: Diagnosis not present

## 2018-06-19 DIAGNOSIS — I340 Nonrheumatic mitral (valve) insufficiency: Secondary | ICD-10-CM | POA: Diagnosis not present

## 2018-06-19 DIAGNOSIS — I739 Peripheral vascular disease, unspecified: Secondary | ICD-10-CM | POA: Diagnosis present

## 2018-06-19 DIAGNOSIS — I130 Hypertensive heart and chronic kidney disease with heart failure and stage 1 through stage 4 chronic kidney disease, or unspecified chronic kidney disease: Secondary | ICD-10-CM | POA: Diagnosis present

## 2018-06-19 DIAGNOSIS — R350 Frequency of micturition: Secondary | ICD-10-CM | POA: Diagnosis present

## 2018-06-19 DIAGNOSIS — M545 Low back pain: Secondary | ICD-10-CM | POA: Diagnosis present

## 2018-06-19 DIAGNOSIS — I440 Atrioventricular block, first degree: Secondary | ICD-10-CM | POA: Diagnosis present

## 2018-06-19 DIAGNOSIS — I442 Atrioventricular block, complete: Secondary | ICD-10-CM | POA: Diagnosis present

## 2018-06-19 DIAGNOSIS — Z79891 Long term (current) use of opiate analgesic: Secondary | ICD-10-CM | POA: Diagnosis not present

## 2018-06-19 DIAGNOSIS — D62 Acute posthemorrhagic anemia: Secondary | ICD-10-CM | POA: Diagnosis not present

## 2018-06-19 DIAGNOSIS — N184 Chronic kidney disease, stage 4 (severe): Secondary | ICD-10-CM | POA: Diagnosis present

## 2018-06-19 DIAGNOSIS — I2510 Atherosclerotic heart disease of native coronary artery without angina pectoris: Secondary | ICD-10-CM | POA: Diagnosis not present

## 2018-06-19 DIAGNOSIS — N400 Enlarged prostate without lower urinary tract symptoms: Secondary | ICD-10-CM | POA: Diagnosis present

## 2018-06-19 DIAGNOSIS — R079 Chest pain, unspecified: Secondary | ICD-10-CM

## 2018-06-19 DIAGNOSIS — Z79899 Other long term (current) drug therapy: Secondary | ICD-10-CM

## 2018-06-19 DIAGNOSIS — I200 Unstable angina: Secondary | ICD-10-CM | POA: Diagnosis not present

## 2018-06-19 DIAGNOSIS — R251 Tremor, unspecified: Secondary | ICD-10-CM | POA: Diagnosis present

## 2018-06-19 DIAGNOSIS — K5900 Constipation, unspecified: Secondary | ICD-10-CM | POA: Diagnosis not present

## 2018-06-19 DIAGNOSIS — Z9841 Cataract extraction status, right eye: Secondary | ICD-10-CM

## 2018-06-19 DIAGNOSIS — I4510 Unspecified right bundle-branch block: Secondary | ICD-10-CM | POA: Diagnosis not present

## 2018-06-19 DIAGNOSIS — E785 Hyperlipidemia, unspecified: Secondary | ICD-10-CM | POA: Diagnosis present

## 2018-06-19 DIAGNOSIS — R001 Bradycardia, unspecified: Secondary | ICD-10-CM | POA: Diagnosis present

## 2018-06-19 DIAGNOSIS — G4700 Insomnia, unspecified: Secondary | ICD-10-CM | POA: Diagnosis present

## 2018-06-19 DIAGNOSIS — Z87891 Personal history of nicotine dependence: Secondary | ICD-10-CM

## 2018-06-19 DIAGNOSIS — N401 Enlarged prostate with lower urinary tract symptoms: Secondary | ICD-10-CM | POA: Diagnosis present

## 2018-06-19 DIAGNOSIS — Z9842 Cataract extraction status, left eye: Secondary | ICD-10-CM | POA: Diagnosis not present

## 2018-06-19 DIAGNOSIS — G894 Chronic pain syndrome: Secondary | ICD-10-CM | POA: Diagnosis present

## 2018-06-19 DIAGNOSIS — I714 Abdominal aortic aneurysm, without rupture: Secondary | ICD-10-CM | POA: Diagnosis not present

## 2018-06-19 DIAGNOSIS — D631 Anemia in chronic kidney disease: Secondary | ICD-10-CM | POA: Diagnosis present

## 2018-06-19 DIAGNOSIS — R072 Precordial pain: Secondary | ICD-10-CM | POA: Diagnosis not present

## 2018-06-19 DIAGNOSIS — E782 Mixed hyperlipidemia: Secondary | ICD-10-CM | POA: Diagnosis not present

## 2018-06-19 DIAGNOSIS — I25118 Atherosclerotic heart disease of native coronary artery with other forms of angina pectoris: Secondary | ICD-10-CM | POA: Diagnosis not present

## 2018-06-19 DIAGNOSIS — I110 Hypertensive heart disease with heart failure: Secondary | ICD-10-CM | POA: Diagnosis not present

## 2018-06-19 DIAGNOSIS — D696 Thrombocytopenia, unspecified: Secondary | ICD-10-CM | POA: Diagnosis not present

## 2018-06-19 HISTORY — DX: Chest pain, unspecified: R07.9

## 2018-06-19 LAB — BASIC METABOLIC PANEL
ANION GAP: 12 (ref 5–15)
BUN: 26 mg/dL — ABNORMAL HIGH (ref 8–23)
CO2: 21 mmol/L — ABNORMAL LOW (ref 22–32)
Calcium: 8.7 mg/dL — ABNORMAL LOW (ref 8.9–10.3)
Chloride: 105 mmol/L (ref 98–111)
Creatinine, Ser: 2.09 mg/dL — ABNORMAL HIGH (ref 0.61–1.24)
GFR calc Af Amer: 34 mL/min — ABNORMAL LOW (ref >60)
GFR calc non Af Amer: 29 mL/min — ABNORMAL LOW (ref >60)
Glucose, Bld: 156 mg/dL — ABNORMAL HIGH (ref 70–99)
Potassium: 3.7 mmol/L (ref 3.5–5.1)
Sodium: 138 mmol/L (ref 135–145)

## 2018-06-19 LAB — CBC WITH DIFFERENTIAL/PLATELET
Abs Immature Granulocytes: 0.02 10*3/uL (ref 0.00–0.07)
Basophils Absolute: 0.1 10*3/uL (ref 0.0–0.1)
Basophils Relative: 2 %
Eosinophils Absolute: 0.5 10*3/uL (ref 0.0–0.5)
Eosinophils Relative: 8 %
HCT: 33.7 % — ABNORMAL LOW (ref 39.0–52.0)
HEMOGLOBIN: 10.8 g/dL — AB (ref 13.0–17.0)
Immature Granulocytes: 0 %
LYMPHS ABS: 2.1 10*3/uL (ref 0.7–4.0)
Lymphocytes Relative: 33 %
MCH: 29.7 pg (ref 26.0–34.0)
MCHC: 32 g/dL (ref 30.0–36.0)
MCV: 92.6 fL (ref 80.0–100.0)
Monocytes Absolute: 0.6 10*3/uL (ref 0.1–1.0)
Monocytes Relative: 9 %
Neutro Abs: 3 10*3/uL (ref 1.7–7.7)
Neutrophils Relative %: 48 %
Platelets: 214 10*3/uL (ref 150–400)
RBC: 3.64 MIL/uL — ABNORMAL LOW (ref 4.22–5.81)
RDW: 13.3 % (ref 11.5–15.5)
WBC: 6.3 10*3/uL (ref 4.0–10.5)
nRBC: 0 % (ref 0.0–0.2)

## 2018-06-19 LAB — TROPONIN I
Troponin I: <0.03 ng/mL (ref <0.03)
Troponin I: <0.03 ng/mL (ref <0.03)
Troponin I: <0.03 ng/mL (ref <0.03)

## 2018-06-19 MED ORDER — VITAMIN B-12 1000 MCG PO TABS
1000.0000 ug | ORAL_TABLET | Freq: Every day | ORAL | Status: DC
  Administered 2018-06-20 – 2018-06-26 (×7): 1000 ug via ORAL
  Filled 2018-06-19 (×7): qty 1

## 2018-06-19 MED ORDER — ONDANSETRON HCL 4 MG/2ML IJ SOLN: 4.0000 mg | Freq: Four times a day (QID) | INTRAMUSCULAR | Status: DC | PRN

## 2018-06-19 MED ORDER — ASPIRIN 81 MG PO CHEW
324.0000 mg | CHEWABLE_TABLET | Freq: Once | ORAL | Status: AC
  Administered 2018-06-19: 324 mg via ORAL
  Filled 2018-06-19: qty 4

## 2018-06-19 MED ORDER — MELATONIN 3 MG PO TABS
9.0000 mg | ORAL_TABLET | Freq: Every day | ORAL | Status: DC
  Administered 2018-06-19 – 2018-06-26 (×8): 9 mg via ORAL
  Filled 2018-06-19 (×8): qty 3

## 2018-06-19 MED ORDER — TAMSULOSIN HCL 0.4 MG PO CAPS
0.4000 mg | ORAL_CAPSULE | Freq: Every day | ORAL | Status: DC
  Administered 2018-06-19 – 2018-07-03 (×15): 0.4 mg via ORAL
  Filled 2018-06-19 (×16): qty 1

## 2018-06-19 MED ORDER — ALBUTEROL SULFATE (2.5 MG/3ML) 0.083% IN NEBU: 3.0000 mL | INHALATION_SOLUTION | Freq: Four times a day (QID) | RESPIRATORY_TRACT | Status: DC | PRN

## 2018-06-19 MED ORDER — MORPHINE SULFATE (PF) 2 MG/ML IV SOLN: 2.0000 mg | INTRAVENOUS | Status: DC | PRN

## 2018-06-19 MED ORDER — NITROGLYCERIN 0.4 MG SL SUBL
0.4000 mg | SUBLINGUAL_TABLET | SUBLINGUAL | Status: AC | PRN
  Administered 2018-06-19 – 2018-06-24 (×5): 0.4 mg via SUBLINGUAL
  Filled 2018-06-19 (×3): qty 1

## 2018-06-19 MED ORDER — AMLODIPINE BESYLATE 10 MG PO TABS
10.0000 mg | ORAL_TABLET | Freq: Every day | ORAL | Status: DC
  Administered 2018-06-20 – 2018-06-26 (×7): 10 mg via ORAL
  Filled 2018-06-19 (×7): qty 1

## 2018-06-19 MED ORDER — ACETAMINOPHEN 325 MG PO TABS: 650.0000 mg | ORAL_TABLET | ORAL | Status: DC | PRN

## 2018-06-19 MED ORDER — PROPRANOLOL HCL 40 MG PO TABS
40.0000 mg | ORAL_TABLET | Freq: Two times a day (BID) | ORAL | Status: DC
  Administered 2018-06-19 (×2): 40 mg via ORAL
  Filled 2018-06-19 (×3): qty 1

## 2018-06-19 MED ORDER — DIAZEPAM 5 MG PO TABS
2.5000 mg | ORAL_TABLET | Freq: Every day | ORAL | Status: DC
  Administered 2018-06-19 – 2018-06-26 (×8): 5 mg via ORAL
  Filled 2018-06-19 (×9): qty 1

## 2018-06-19 MED ORDER — FUROSEMIDE 20 MG PO TABS
20.0000 mg | ORAL_TABLET | ORAL | Status: DC
  Administered 2018-06-20 – 2018-06-25 (×3): 20 mg via ORAL
  Filled 2018-06-19 (×3): qty 1

## 2018-06-19 MED ORDER — FUROSEMIDE 20 MG PO TABS: 20.0000 mg | ORAL_TABLET | ORAL | Status: DC

## 2018-06-19 MED ORDER — OXYCODONE-ACETAMINOPHEN 7.5-325 MG PO TABS: 1.0000 | ORAL_TABLET | Freq: Two times a day (BID) | ORAL | Status: DC | PRN

## 2018-06-19 MED ORDER — FENOFIBRATE 160 MG PO TABS
160.0000 mg | ORAL_TABLET | Freq: Every day | ORAL | Status: DC
  Administered 2018-06-20 – 2018-06-26 (×7): 160 mg via ORAL
  Filled 2018-06-19 (×7): qty 1

## 2018-06-19 MED ORDER — TIZANIDINE HCL 4 MG PO TABS
4.0000 mg | ORAL_TABLET | Freq: Every day | ORAL | Status: DC
  Administered 2018-06-19 – 2018-06-26 (×8): 4 mg via ORAL
  Filled 2018-06-19 (×8): qty 1

## 2018-06-19 MED ORDER — VITAMIN D-3 25 MCG (1000 UT) PO CAPS: 1000.0000 [IU] | ORAL_CAPSULE | Freq: Every day | ORAL | Status: DC

## 2018-06-19 MED ORDER — ENOXAPARIN SODIUM 30 MG/0.3ML ~~LOC~~ SOLN
30.0000 mg | SUBCUTANEOUS | Status: DC
  Administered 2018-06-19 – 2018-06-26 (×8): 30 mg via SUBCUTANEOUS
  Filled 2018-06-19 (×7): qty 0.3

## 2018-06-19 MED ORDER — CLOPIDOGREL BISULFATE 75 MG PO TABS
75.0000 mg | ORAL_TABLET | Freq: Every day | ORAL | Status: DC
  Administered 2018-06-20: 75 mg via ORAL
  Filled 2018-06-19: qty 1

## 2018-06-19 MED ORDER — GABAPENTIN 300 MG PO CAPS
300.0000 mg | ORAL_CAPSULE | Freq: Two times a day (BID) | ORAL | Status: DC
  Administered 2018-06-19 – 2018-07-04 (×29): 300 mg via ORAL
  Filled 2018-06-19 (×30): qty 1

## 2018-06-19 MED ORDER — ATORVASTATIN CALCIUM 10 MG PO TABS
20.0000 mg | ORAL_TABLET | Freq: Every day | ORAL | Status: DC
  Administered 2018-06-20 – 2018-07-04 (×14): 20 mg via ORAL
  Filled 2018-06-19 (×14): qty 2

## 2018-06-19 MED ORDER — VITAMIN D 25 MCG (1000 UNIT) PO TABS
1000.0000 [IU] | ORAL_TABLET | Freq: Every day | ORAL | Status: DC
  Administered 2018-06-20 – 2018-06-26 (×7): 1000 [IU] via ORAL
  Filled 2018-06-19 (×7): qty 1

## 2018-06-19 MED ORDER — OXYCODONE-ACETAMINOPHEN 7.5-325 MG PO TABS
1.0000 | ORAL_TABLET | Freq: Two times a day (BID) | ORAL | Status: DC | PRN
  Administered 2018-06-19 – 2018-06-26 (×14): 1 via ORAL
  Filled 2018-06-19 (×16): qty 1

## 2018-06-19 NOTE — H&P (Signed)
History and Physical    Kevin [name] EHU:[id number] DOB: 1940/12/23 DOA: 06/19/2018  PCP: Marin Olp, MD Consultants:  none Patient coming from: home- lives with wife  Chief Complaint: Chest discomfort  HPI: Kevin [name] is a 78 y.o. 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 It occurs most days, is lower L-sided, described as an "ache." He says it occurs no more than once a day. It started about 3 weeks ago, pt does not believe it is becoming more frequent but wife states she sees him holding his chest more often the last 2-3 days. Rates it as a 4-7 in intensity. When pain comes on he is usually at rest. It is not made worse with exertion. It does not radiate. Pain episodes last on average 30-40 minutes. No alleviating factors. He is on long--term oxycodone for chronic arthritis pain. No associated SOB, diaphroresis, dizziness. He does not take aspirin. His activity level is limited by his arthritis pain. His last stress test was May 2017 and was positive for a small area of ischemia. He has been treated medically. Has never had a cardiac cath. His cardiologist is [facility] at [facility] (Dr. Curt Bears, formerly was Dr. Arnoldo Morale) but he has not seen cardiologist since May 2017.  ED Course: Chest discomfort resolved after NTG.  Review of Systems: As per HPI; otherwise review of systems reviewed and negative. No melena, no hematochezia. He is UTD on his colonoscopy. No hematuria. +easy bruising on plavix.  Ambulatory Status:  Ambulates without assistance  Past Medical History:  Diagnosis Date  . Arthritis    DDD- Lower Back  . Bradycardia   . CAP (community acquired pneumonia) 03/17/2015   "THAT'S WHAT THEY ARE THINKING; THEY ARE NOT SURE"  . Carotid artery occlusion   . Chronic lower back pain    DDD  . CKD (chronic kidney  disease)    Dr. Corliss Parish  . Diverticulosis   . Enlarged prostate    takes Flomax daily  . History of blood transfusion    "probably when they did aortic aneurysm"  . History of colon polyps    benign  . Hyperlipidemia   . Hypertension    takes Amlodipine and Zebeta daily  . Insomnia    takes Melatonin nightly  . Joint pain   . 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     . Muscle spasm    takes Zanaflex daily as needed  . Peripheral edema    takes Furosemide daily  . Pneumonia    hx of   . Rheumatic fever 1946  . Rheumatoid arthritis ([place])   . Short-term memory loss    minimal  . Shortness of breath dyspnea    with exertion but lasts very short period of time  . TIA (transient ischemic attack)    x 2;takes Plavix daily  . Urinary frequency    takes Flomax daily  . Urinary urgency     Past Surgical History:  Procedure Laterality Date  . ABDOMINAL AORTIC ANEURYSM REPAIR  2010  . CAROTID ENDARTERECTOMY Left 09/20/2004  . CATARACT EXTRACTION, BILATERAL     2016-2017  . COLONOSCOPY    . ENDARTERECTOMY Right 10/12/2015   Procedure: RIGHT CAROTID ENDARTERECTOMY WITH PATCH ANGIOPLASTY;  Surgeon: Elam Dutch, MD;  Location: [facility];  Service: Vascular;  Laterality: Right;  . INGUINAL HERNIA REPAIR Left 02/05/2013   Procedure: HERNIA REPAIR INGUINAL ADULT;  Surgeon: Joyice Faster. Cornett, MD;  Location: [facility];  Service: General;  Laterality: Left;  . INGUINAL HERNIA REPAIR Left   . INSERTION OF MESH Left 02/05/2013   Procedure: INSERTION OF MESH;  Surgeon: Joyice Faster. Cornett, MD;  Location: [facility];  Service: General;  Laterality: Left;  . SHOULDER ARTHROSCOPY WITH ROTATOR CUFF REPAIR AND SUBACROMIAL DECOMPRESSION Left 05/01/2014   Procedure: LEFT ARTHROSCOPY SHOULDER SUBACROMIAL DECOMPRESSION,DISTAL CLAVICAL RESECTION AND ROTATOR CUFF REPAIR;  Surgeon: Marin Shutter, MD;  Location: [facility];  Service: Orthopedics;   Laterality: Left;  . TESTICLAR CYST EXCISION Left   . TONSILLECTOMY    . VASECTOMY      Social History   Socioeconomic History  . Marital status: Married    Spouse name: Not on file  . Number of children: Not on file  . Years of education: Not on file  . Highest education level: Not on file  Occupational History  . Occupation: retired    Comment: maintenance; electrician  Social Needs  . Financial resource strain: Not on file  . Food insecurity:    Worry: Not on file    Inability: Not on file  . Transportation needs:    Medical: Not on file    Non-medical: Not on file  Tobacco Use  . Smoking status: Former Smoker    Packs/day: 2.00    Years: 28.00    Pack years: 56.00    Types: Cigarettes    Last attempt to quit: 05/31/1987    Years since quitting: 31.0  . Smokeless tobacco: Never Used  . Tobacco comment: quit smoking "in my 34's"  Substance and Sexual Activity  . Alcohol use: Yes    Alcohol/week: 0.0 standard drinks    Comment: rare, mikes hard lemonade  . Drug use: No    Types: Marijuana    Comment: hx of-4-5 yrs ago  . Sexual activity: Not Currently  Lifestyle  . Physical activity:    Days per week: Not on file    Minutes per session: Not on file  . Stress: Not on file  Relationships  . Social connections:    Talks on phone: Not on file    Gets together: Not on file    Attends religious service: Not on file    Active member of club or organization: Not on file    Attends meetings of clubs or organizations: Not on file    Relationship status: Not on file  . Intimate partner violence:    Fear of current or ex partner: Not on file    Emotionally abused: Not on file    Physically abused: Not on file    Forced sexual activity: Not on file  Other Topics Concern  . Not on file  Social History Narrative   Married 33 years in 2015. Lived together for 12 years. 2 boys from first wife. 4 grandkids (1 in [place], 1 in 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       Hobbies: yardwork, [place] barn, woodwork, collect knive    No Known Allergies  Family History  Problem Relation Age of Onset  . Parkinsonism Father   . Dementia Father   . Cancer Father        Throat  .  Colon cancer Mother        died in 46s  . Cancer Mother        Colon    Prior to Admission medications   Medication Sig Start Date End Date Taking? Authorizing Provider  albuterol (VENTOLIN HFA) 108 (90 Base) MCG/ACT inhaler Inhale 2 puffs into the lungs every 6 (six) hours as needed for wheezing or shortness of breath.   Yes [provider]  amLODipine (NORVASC) 10 MG tablet TAKE 1 TABLET EVERY DAY Patient taking differently: Take 10 mg by mouth daily.  03/13/18  Yes Marin Olp, MD  atorvastatin (LIPITOR) 20 MG tablet TAKE 1 TABLET EVERY DAY Patient taking differently: Take 20 mg by mouth daily.  03/13/18  Yes Marin Olp, MD  BIOTIN PO Take 1 tablet by mouth daily.   Yes [provider]  Cholecalciferol (VITAMIN D-3) 1000 UNITS CAPS Take 1,000 Units by mouth daily.    Yes [provider]  clopidogrel (PLAVIX) 75 MG tablet TAKE 1 TABLET EVERY DAY Patient taking differently: Take 75 mg by mouth daily.  03/13/18  Yes Marin Olp, MD  diazepam (VALIUM) 5 MG tablet TAKE 1/2 TO 1 TABLET EVERY 8 (EIGHT) HOURS AS NEEDED FOR MUSCLE SPASMS OR SEDATION. Patient taking differently: Take 2.5-5 mg by mouth at bedtime.  04/17/18  Yes Marin Olp, MD  fenofibrate 160 MG tablet TAKE 1 TABLET EVERY DAY Patient taking differently: Take 160 mg by mouth daily.  03/27/18  Yes Marin Olp, MD  furosemide (LASIX) 20 MG tablet TAKE 1 TABLET EVERY OTHER DAY Patient taking differently: Take 20 mg by mouth every other day.  03/27/18  Yes Marin Olp, MD  gabapentin (NEURONTIN) 300 MG capsule TAKE 1 CAPSULE TWICE DAILY Patient taking differently: Take 300 mg  by mouth 2 (two) times daily.  02/20/18  Yes Marin Olp, MD  Melatonin 10 MG TABS Take 10 mg by mouth at bedtime.    Yes [provider]  nitroGLYCERIN (NITRODUR - DOSED IN MG/24 HR) 0.2 mg/hr patch Place 1/4 to 1/2 of a patch over affected region. Remove and replace once daily.  Slightly alter skin placement daily 05/14/18  Yes Gerda Diss, DO  oxyCODONE-acetaminophen (PERCOCET) 7.5-325 MG tablet Take 1 tablet by mouth 2 (two) times daily as needed for moderate pain. May take an extra tablet when pain is severe, 10 days only Patient taking differently: Take 1 tablet by mouth 2 (two) times daily. May take an extra tablet when pain is severe, 10 days only 05/15/18  Yes Bayard Hugger, NP  propranolol (INDERAL) 40 MG tablet TAKE 1 TABLET TWICE DAILY Patient taking differently: Take 40 mg by mouth 2 (two) times daily.  03/27/18  Yes Marin Olp, MD  tamsulosin (FLOMAX) 0.4 MG CAPS capsule TAKE 1 CAPSULE EVERY DAY Patient taking differently: Take 0.4 mg by mouth at bedtime.  03/27/18  Yes Marin Olp, MD  tiZANidine (ZANAFLEX) 4 MG tablet TAKE 1 TABLET EVERY 6 HOURS AS NEEDED FOR MUSCLE SPASM(S) Patient taking differently: Take 4 mg by mouth at bedtime.  05/21/18  Yes Marin Olp, MD  vitamin B-12 (CYANOCOBALAMIN) 1000 MCG tablet Take 1,000 mcg by mouth daily.   Yes [provider]  triamcinolone cream (KENALOG) 0.1 % APPLY 1 APPLICATION 2 TIMES DAILY TOPICALLY FOR 7-10 DAYS Patient taking differently: Apply 1 application topically 2 (two) times daily.  07/31/17   Marin Olp, MD    Physical  Exam: Vitals:   06/19/18 1040 06/19/18 1043 06/19/18 1122  BP: (!) 141/63    Pulse: (!) 54    Resp: 16    Temp:   (!) 97.5 F (36.4 C)  TempSrc:   Oral  SpO2: 100%    Weight:  81.6 kg   Height:  5\' 7"  (1.702 m)      . General: Appears calm and comfortable and is in NAD . Eyes:  PERRL, EOMI, normal lids, iris . ENT:  grossly normal hearing, lips &  tongue, mmm . Neck:  supple, no lymphadenopathy . Cardiovascular:  nL S1, S2, normal rate, reg rhythm, no murmur. CP not reproducible . Respiratory:   CTA bilaterally with no wheezes/rales/rhonchi.  Normal respiratory effort. . Abdomen:  soft, NT, ND, NABS . Back:   grossly normal alignment . Skin:  no rash or lesions seen on limited exam . Musculoskeletal:  grossly normal tone BUE/BLE, good ROM, no bony abnormality or obvious joint deformity . Lower extremities:  No LE edema.  Limited foot exam with no ulcerations.  2+ distal pulses. Marland Kitchen Psychiatric:  grossly normal mood and affect, speech fluent and appropriate, AOx3 . Neurologic:  CN 2-12 grossly intact, moves all extremities in coordinated fashion, sensation intact, Patellar DTRs 2+ and symmetric    Radiological Exams on Admission: Dg Chest 2 View  Result Date: 06/19/2018 CLINICAL DATA:  Chest pain x2 weeks EXAM: CHEST - 2 VIEW COMPARISON:  10/06/2016 FINDINGS: Lungs are clear.  No pleural effusion or pneumothorax. The heart is normal in size. Degenerative changes of the visualized thoracolumbar spine. IMPRESSION: Normal chest radiographs. Electronically Signed   By: Julian Hy M.D.   On: 06/19/2018 11:19    EKG: Independently reviewed.  Rate 56 Sinus rhythm Borderline left axis deviation Baseline wander in lead(s) V3 no significant change since Oct 2016   Labs on Admission: I have personally reviewed the available labs and imaging studies at the time of the admission.  Pertinent labs:  Sodium 138 potassium 3.7 chloride 105 CO2 21 BUN 26 creatinine 2.09, near baseline; glucose 156 calcium 8.7 Troponin less than 0.03 WBC 6.3 hemoglobin 10.8, below baseline which is usually 11-12; platelets 214   Assessment/Plan Principal Problem:   Chest pain Active Problems:   Hyperlipidemia   UNSPECIFIED ANEMIA   Essential hypertension   BPH (benign prostatic hyperplasia)   Chronic pain syndrome   History of CVA (cerebrovascular  accident)   Carotid artery stenosis s/p L carotid endarterectomy   CKD (chronic kidney disease), stage III (HCC)   Diastolic CHF (HCC)   PAD (peripheral artery disease) (HCC)  Chest pain: -admit to tele observation -cycle troponins -EKG in a.m. -NTG prn -cont BP meds, statin -cards consulted for possible stress in a.m.; NPO after MN -risk stratify: HbA1C, fasting lipid panel  Anemia, normocytic -unclear etiology -pt is UTD on colonoscopy (2015), was told he did not need to have another; he has no known melena/hematochezia -last iron panel WNL (03/05/2018) -will hemoccult stools -send RBC folate, B12 -monitor, transfuse for Hgb <8  HTN -cont home BP meds: lasix, inderal, norvasc  HLD -cont home lipitor, fenofibrate  BPH -cont home flomax  Chronic pain -cont home oxycodone BID  CKD: Creat at baseline -dose all meds for GFR   DVT prophylaxis: lovenox Code Status:  Full - confirmed with patient/family Family Communication: wife at bedside  Disposition Plan:  Home once clinically improved Consults called: cardiology - will see pt in am  Admission status: It is my  clinical opinion that referral for OBSERVATION is reasonable and necessary in this patient based on the above information provided. The aforementioned taken together are felt to place the patient at high risk for further clinical deterioration. However it is anticipated that the patient may be medically stable for discharge from the hospital within 24 to 48 hours.     Janora Norlander MD [facility]  If note is complete, please contact covering daytime or nighttime physician. [email or web address] Password [email or web address]  06/19/2018, 1:56 PM

## 2018-06-19 NOTE — Telephone Encounter (Signed)
See note

## 2018-06-19 NOTE — ED Triage Notes (Signed)
Pt arrives to ED from home with complaints of chest pain on and off for the past month. Pt sent here from his PCP.

## 2018-06-19 NOTE — Telephone Encounter (Signed)
Returned call to patient who was requesting an appointment via My Chart. Pt states that he has chest pain left side. He states that this pain has been intermittent over the last 2 weeks. He denies that the pain radiates but states he has chronic back pain. The pain can last up to 1 hour at times.  He rates it 4-7 moderate but states it has not woke him from sleep.  His Hx includes an aneurism, HTN, carotid surgery. Per protocol pt will be seen in the ED today. Care advice read to patient. Pt verbalized understanding  Reason for Disposition . [1] Intermittent  chest pain or "angina" AND [2] increasing in severity or frequency  (Exception: pains lasting a few seconds)  Answer Assessment - Initial Assessment Questions 1. LOCATION: "Where does it hurt?"       Left side just below breast 2. RADIATION: "Does the pain go anywhere else?" (e.g., into neck, jaw, arms, back)     Chronic back pain 3. ONSET: "When did the chest pain begin?" (Minutes, hours or days)      2 weeks 4. PATTERN "Does the pain come and go, or has it been constant since it started?"  "Does it get worse with exertion?"      Comes and goes 5. DURATION: "How long does it last" (e.g., seconds, minutes, hours)     1 hour 6. SEVERITY: "How bad is the pain?"  (e.g., Scale 1-10; mild, moderate, or severe)    - MILD (1-3): doesn't interfere with normal activities     - MODERATE (4-7): interferes with normal activities or awakens from sleep    - SEVERE (8-10): excruciating pain, unable to do any normal activities       Moderate does not wake him from sleep 7. CARDIAC RISK FACTORS: "Do you have any history of heart problems or risk factors for heart disease?" (e.g., prior heart attack, angina; high blood pressure, diabetes, being overweight, high cholesterol, smoking, or strong family history of heart disease)     HTN Cholesterol former smoker 8. PULMONARY RISK FACTORS: "Do you have any history of lung disease?"  (e.g., blood clots in lung,  asthma, emphysema, birth control pills)     no 9. CAUSE: "What do you think is causing the chest pain?"     Nothing  10. OTHER SYMPTOMS: "Do you have any other symptoms?" (e.g., dizziness, nausea, vomiting, sweating, fever, difficulty breathing, cough)       Dizziness chronic off balance in morning 11. PREGNANCY: "Is there any chance you are pregnant?" "When was your last menstrual period?"       N/A  Protocols used: CHEST PAIN-A-AH

## 2018-06-19 NOTE — ED Provider Notes (Addendum)
[facility] EMERGENCY DEPARTMENT Provider Note   CSN: [id number] Arrival date & time: 06/19/18  1030     History   Chief Complaint Chief Complaint  Patient presents with  . Chest Pain    HPI Kevin [name] is a 78 y.o. male.  HPI  78 year old male presents with chest pain.  This is been on and off for about 2 or 3 weeks.  Seems to be getting more frequent and worsening.  The patient states he has a little bit of pressure in his left lower chest right now but is not bad.  He has no known coronary disease but does have carotid artery disease and has had a AAA with repair.  No back or abdominal pain.  No shortness of breath or diaphoresis.  No vomiting.  Right now he has a hard time rating the pain because it is so mild.  Called his doctor and was sent here.  Past Medical History:  Diagnosis Date  . Arthritis    DDD- Lower Back  . Bradycardia   . CAP (community acquired pneumonia) 03/17/2015   "THAT'S WHAT THEY ARE THINKING; THEY ARE NOT SURE"  . Carotid artery occlusion   . Chest pain 06/19/2018  . Chronic lower back pain    DDD  . CKD (chronic kidney disease)    Dr. Corliss Parish  . Diverticulosis   . Enlarged prostate    takes Flomax daily  . History of blood transfusion    "probably when they did aortic aneurysm"  . History of colon polyps    benign  . Hyperlipidemia   . Hypertension    takes Amlodipine and Zebeta daily  . Insomnia    takes Melatonin nightly  . Joint pain   . 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     . Muscle spasm    takes Zanaflex daily as needed  . Peripheral edema    takes Furosemide daily  . Pneumonia    hx of   . Rheumatic fever 1946  . Rheumatoid arthritis ([place])   . Short-term memory loss    minimal  . Shortness of breath dyspnea    with exertion but lasts very short period of time  . TIA (transient ischemic attack)    x 2;takes Plavix daily  . Urinary frequency    takes Flomax daily   . Urinary urgency     Patient Active Problem List   Diagnosis Date Noted  . Ischemic chest pain ([place]) 06/19/2018  . Chest pain 06/19/2018  . Constipation 09/19/2017  . Lumbar spondylosis 03/17/2017  . PAD (peripheral artery disease) ([place]) 01/08/2016  . Diastolic CHF ([place]) 18/84/1660  . CAP (community acquired pneumonia) 03/17/2015  . CKD (chronic kidney disease), stage III ([place]) 01/27/2014  . Family history of malignant neoplasm of gastrointestinal tract 05/15/2013  . Personal history of colonic polyps 05/15/2013  . AAA (abdominal aortic aneurysm, ruptured) ([place]) 01/17/2013  . Inguinal hernia 05/01/2012  . Carotid artery stenosis s/p L carotid endarterectomy 01/12/2012  . History of CVA (cerebrovascular accident) 11/17/2011  . Chronic pain syndrome 12/23/2009  . BURSITIS, HIP 12/21/2009  . Essential tremor 10/05/2009  . ACTINIC KERATOSIS, EAR, LEFT 03/09/2009  . UNSPECIFIED ANEMIA 12/05/2008  . 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 BPH (benign prostatic hyperplasia) 06/04/2007  . Fasting hyperglycemia 02/05/2007  . Hyperlipidemia 01/03/2007  . Essential hypertension 01/03/2007    Past Surgical History:  Procedure Laterality Date  .  ABDOMINAL AORTIC ANEURYSM REPAIR  2010  . CAROTID ENDARTERECTOMY Left 09/20/2004  . CATARACT EXTRACTION, BILATERAL     2016-2017  . COLONOSCOPY    . ENDARTERECTOMY Right 10/12/2015   Procedure: RIGHT CAROTID ENDARTERECTOMY WITH PATCH ANGIOPLASTY;  Surgeon: Elam Dutch, MD;  Location: [facility];  Service: Vascular;  Laterality: Right;  . INGUINAL HERNIA REPAIR Left 02/05/2013   Procedure: HERNIA REPAIR INGUINAL ADULT;  Surgeon: Joyice Faster. Cornett, MD;  Location: [facility];  Service: General;  Laterality: Left;  . INGUINAL HERNIA REPAIR Left   . INSERTION OF MESH Left 02/05/2013   Procedure: INSERTION OF MESH;  Surgeon: Joyice Faster. Cornett, MD;  Location: [facility];  Service:  General;  Laterality: Left;  . SHOULDER ARTHROSCOPY WITH ROTATOR CUFF REPAIR AND SUBACROMIAL DECOMPRESSION Left 05/01/2014   Procedure: LEFT ARTHROSCOPY SHOULDER SUBACROMIAL DECOMPRESSION,DISTAL CLAVICAL RESECTION AND ROTATOR CUFF REPAIR;  Surgeon: Marin Shutter, MD;  Location: [facility];  Service: Orthopedics;  Laterality: Left;  . TESTICLAR CYST EXCISION Left   . TONSILLECTOMY    . VASECTOMY          Home Medications    Prior to Admission medications   Medication Sig Start Date End Date Taking? Authorizing Provider  albuterol (VENTOLIN HFA) 108 (90 Base) MCG/ACT inhaler Inhale 2 puffs into the lungs every 6 (six) hours as needed for wheezing or shortness of breath.   Yes [provider]  amLODipine (NORVASC) 10 MG tablet TAKE 1 TABLET EVERY DAY Patient taking differently: Take 10 mg by mouth daily.  03/13/18  Yes Marin Olp, MD  atorvastatin (LIPITOR) 20 MG tablet TAKE 1 TABLET EVERY DAY Patient taking differently: Take 20 mg by mouth daily.  03/13/18  Yes Marin Olp, MD  BIOTIN PO Take 1 tablet by mouth daily.   Yes [provider]  Cholecalciferol (VITAMIN D-3) 1000 UNITS CAPS Take 1,000 Units by mouth daily.    Yes [provider]  clopidogrel (PLAVIX) 75 MG tablet TAKE 1 TABLET EVERY DAY Patient taking differently: Take 75 mg by mouth daily.  03/13/18  Yes Marin Olp, MD  diazepam (VALIUM) 5 MG tablet TAKE 1/2 TO 1 TABLET EVERY 8 (EIGHT) HOURS AS NEEDED FOR MUSCLE SPASMS OR SEDATION. Patient taking differently: Take 2.5-5 mg by mouth at bedtime.  04/17/18  Yes Marin Olp, MD  fenofibrate 160 MG tablet TAKE 1 TABLET EVERY DAY Patient taking differently: Take 160 mg by mouth daily.  03/27/18  Yes Marin Olp, MD  furosemide (LASIX) 20 MG tablet TAKE 1 TABLET EVERY OTHER DAY Patient taking differently: Take 20 mg by mouth every other day.  03/27/18  Yes Marin Olp, MD  gabapentin (NEURONTIN) 300 MG capsule TAKE 1 CAPSULE  TWICE DAILY Patient taking differently: Take 300 mg by mouth 2 (two) times daily.  02/20/18  Yes Marin Olp, MD  Melatonin 10 MG TABS Take 10 mg by mouth at bedtime.    Yes [provider]  oxyCODONE-acetaminophen (PERCOCET) 7.5-325 MG tablet Take 1 tablet by mouth 2 (two) times daily as needed for moderate pain. May take an extra tablet when pain is severe, 10 days only Patient taking differently: Take 1 tablet by mouth 2 (two) times daily. May take an extra tablet when pain is severe, 10 days only 05/15/18  Yes Bayard Hugger, NP  propranolol (INDERAL) 40 MG tablet TAKE 1 TABLET TWICE DAILY Patient taking differently: Take 40 mg by mouth 2 (two)  times daily.  03/27/18  Yes Marin Olp, MD  tamsulosin (FLOMAX) 0.4 MG CAPS capsule TAKE 1 CAPSULE EVERY DAY Patient taking differently: Take 0.4 mg by mouth at bedtime.  03/27/18  Yes Marin Olp, MD  tiZANidine (ZANAFLEX) 4 MG tablet TAKE 1 TABLET EVERY 6 HOURS AS NEEDED FOR MUSCLE SPASM(S) Patient taking differently: Take 4 mg by mouth at bedtime.  05/21/18  Yes Marin Olp, MD  vitamin B-12 (CYANOCOBALAMIN) 1000 MCG tablet Take 1,000 mcg by mouth daily.   Yes [provider]  triamcinolone cream (KENALOG) 0.1 % APPLY 1 APPLICATION 2 TIMES DAILY TOPICALLY FOR 7-10 DAYS Patient taking differently: Apply 1 application topically 2 (two) times daily.  07/31/17   Marin Olp, MD    Family History Family History  Problem Relation Age of Onset  . Parkinsonism Father   . Dementia Father   . Cancer Father        Throat  . Colon cancer Mother        died in 51s  . Cancer Mother        Colon    Social History Social History   Tobacco Use  . Smoking status: Former Smoker    Packs/day: 2.00    Years: 28.00    Pack years: 56.00    Types: Cigarettes    Last attempt to quit: 05/31/1987    Years since quitting: 31.0  . Smokeless tobacco: Never Used  . Tobacco comment: quit smoking "in my 51's"    Substance Use Topics  . Alcohol use: Yes    Alcohol/week: 0.0 standard drinks    Comment: rare, mikes hard lemonade  . Drug use: No    Types: Marijuana    Comment: hx of-4-5 yrs ago     Allergies   Patient has no known allergies.   Review of Systems Review of Systems  Constitutional: Negative for diaphoresis.  Respiratory: Negative for shortness of breath.   Cardiovascular: Positive for chest pain.     Physical Exam Updated Vital Signs BP 134/68 (BP Location: Right Arm)   Pulse (!) 50   Temp 97.6 F (36.4 C) (Oral)   Resp 12   Ht 5\' 7"  (1.702 m)   Wt 81.6 kg   SpO2 97%   BMI 28.19 kg/m   Physical Exam Vitals signs and nursing note reviewed.  Constitutional:      General: He is not in acute distress.    Appearance: He is well-developed. He is not ill-appearing or diaphoretic.  HENT:     Head: Normocephalic and atraumatic.     Right Ear: External ear normal.     Left Ear: External ear normal.     Nose: Nose normal.  Eyes:     General:        Right eye: No discharge.        Left eye: No discharge.  Neck:     Musculoskeletal: Neck supple.  Cardiovascular:     Rate and Rhythm: Normal rate and regular rhythm.     Heart sounds: Normal heart sounds.  Pulmonary:     Effort: Pulmonary effort is normal.     Breath sounds: Normal breath sounds.  Chest:     Chest wall: No tenderness.  Abdominal:     Palpations: Abdomen is soft.     Tenderness: There is no abdominal tenderness.  Skin:    General: Skin is warm and dry.  Neurological:     Mental Status: He is alert.  Psychiatric:        Mood and Affect: Mood is not anxious.      ED Treatments / Results  Labs (all labs ordered are listed, but only abnormal results are displayed) Labs Reviewed  BASIC METABOLIC PANEL - Abnormal; Notable for the following components:      Result Value   CO2 21 (*)    Glucose, Bld 156 (*)    BUN 26 (*)    Creatinine, Ser 2.09 (*)    Calcium 8.7 (*)    GFR calc non Af  Amer 29 (*)    GFR calc Af Amer 34 (*)    All other components within normal limits  CBC WITH DIFFERENTIAL/PLATELET - Abnormal; Notable for the following components:   RBC 3.64 (*)    Hemoglobin 10.8 (*)    HCT 33.7 (*)    All other components within normal limits  TROPONIN I  TROPONIN I  TROPONIN I    EKG EKG Interpretation  Date/Time:  Tuesday June 19 2018 10:40:50 EST Ventricular Rate:  56 PR Interval:    QRS Duration: 105 QT Interval:  420 QTC Calculation: 406 R Axis:   -29 Text Interpretation:  Sinus rhythm Borderline left axis deviation Baseline wander in lead(s) V3 no significant change since Oct 2016 Confirmed by Sherwood Gambler [phone]) on 06/19/2018 11:34:50 AM   Radiology Dg Chest 2 View  Result Date: 06/19/2018 CLINICAL DATA:  Chest pain x2 weeks EXAM: CHEST - 2 VIEW COMPARISON:  10/06/2016 FINDINGS: Lungs are clear.  No pleural effusion or pneumothorax. The heart is normal in size. Degenerative changes of the visualized thoracolumbar spine. IMPRESSION: Normal chest radiographs. Electronically Signed   By: Julian Hy M.D.   On: 06/19/2018 11:19    Procedures Procedures (including critical care time)  Medications Ordered in ED Medications  nitroGLYCERIN (NITROSTAT) SL tablet 0.4 mg (0.4 mg Sublingual Given 06/19/18 1126)  amLODipine (NORVASC) tablet 10 mg (has no administration in time range)  atorvastatin (LIPITOR) tablet 20 mg (has no administration in time range)  fenofibrate tablet 160 mg (has no administration in time range)  propranolol (INDERAL) tablet 40 mg (has no administration in time range)  diazepam (VALIUM) tablet 2.5-5 mg (has no administration in time range)  tamsulosin (FLOMAX) capsule 0.4 mg (has no administration in time range)  clopidogrel (PLAVIX) tablet 75 mg (has no administration in time range)  vitamin B-12 (CYANOCOBALAMIN) tablet 1,000 mcg (has no administration in time range)  Melatonin TABS 9 mg (has no administration in time  range)  gabapentin (NEURONTIN) capsule 300 mg (has no administration in time range)  tiZANidine (ZANAFLEX) tablet 4 mg (has no administration in time range)  albuterol (PROVENTIL) (2.5 MG/3ML) 0.083% nebulizer solution 3 mL (has no administration in time range)  acetaminophen (TYLENOL) tablet 650 mg (has no administration in time range)  ondansetron (ZOFRAN) injection 4 mg (has no administration in time range)  enoxaparin (LOVENOX) injection 30 mg (has no administration in time range)  morphine 2 MG/ML injection 2 mg (has no administration in time range)  cholecalciferol (VITAMIN D3) tablet 1,000 Units (has no administration in time range)  furosemide (LASIX) tablet 20 mg (has no administration in time range)  oxyCODONE-acetaminophen (PERCOCET) 7.5-325 MG per tablet 1 tablet (has no administration in time range)  aspirin chewable tablet 324 mg (324 mg Oral Given 06/19/18 1126)     Initial Impression / Assessment and Plan / ED Course  I have reviewed the triage vital signs and the nursing notes.  Pertinent labs & imaging results that were available during my care of the patient were reviewed by me and considered in my medical decision making (see chart for details).     Patient's chest pain is pretty atypical but given that it seems to be progressively worsening and occasionally with exertion sometimes at rest, I think is reasonable to admit for ACS rule out.  He did not have much chest pressure when he arrived but it did go away with nitroglycerin.  Initial troponin negative.  I had discussion with family and patient and he would like to stay overnight.  Discussed with Dr. Steffanie Dunn who will admit.  I think dissection or PE is pretty unlikely.  Final Clinical Impressions(s) / ED Diagnoses   Final diagnoses:  Atypical chest pain    ED Discharge Orders    None       Sherwood Gambler, MD 06/19/18 1614    Sherwood Gambler, MD 07/02/18 [phone]

## 2018-06-19 NOTE — Telephone Encounter (Signed)
Noted. ED evaluation appropriate

## 2018-06-20 ENCOUNTER — Observation Stay (HOSPITAL_BASED_OUTPATIENT_CLINIC_OR_DEPARTMENT_OTHER): Payer: Medicare HMO

## 2018-06-20 DIAGNOSIS — R072 Precordial pain: Secondary | ICD-10-CM | POA: Diagnosis not present

## 2018-06-20 DIAGNOSIS — G894 Chronic pain syndrome: Secondary | ICD-10-CM

## 2018-06-20 DIAGNOSIS — R079 Chest pain, unspecified: Secondary | ICD-10-CM

## 2018-06-20 LAB — CBC WITH DIFFERENTIAL/PLATELET
Abs Immature Granulocytes: 0.01 10*3/uL (ref 0.00–0.07)
BASOS PCT: 1 %
Basophils Absolute: 0.1 10*3/uL (ref 0.0–0.1)
Eosinophils Absolute: 0.5 10*3/uL (ref 0.0–0.5)
Eosinophils Relative: 9 %
HCT: 32.5 % — ABNORMAL LOW (ref 39.0–52.0)
Hemoglobin: 10.7 g/dL — ABNORMAL LOW (ref 13.0–17.0)
IMMATURE GRANULOCYTES: 0 %
Lymphocytes Relative: 34 %
Lymphs Abs: 2 10*3/uL (ref 0.7–4.0)
MCH: 29.6 pg (ref 26.0–34.0)
MCHC: 32.9 g/dL (ref 30.0–36.0)
MCV: 90 fL (ref 80.0–100.0)
Monocytes Absolute: 0.5 10*3/uL (ref 0.1–1.0)
Monocytes Relative: 8 %
Neutro Abs: 2.7 10*3/uL (ref 1.7–7.7)
Neutrophils Relative %: 48 %
PLATELETS: 200 10*3/uL (ref 150–400)
RBC: 3.61 MIL/uL — ABNORMAL LOW (ref 4.22–5.81)
RDW: 13.2 % (ref 11.5–15.5)
WBC: 5.8 10*3/uL (ref 4.0–10.5)
nRBC: 0 % (ref 0.0–0.2)

## 2018-06-20 LAB — LIPID PANEL
Cholesterol: 113 mg/dL (ref 0–200)
HDL: 23 mg/dL — ABNORMAL LOW (ref >40)
LDL Cholesterol: 55 mg/dL (ref 0–99)
Total CHOL/HDL Ratio: 4.9 RATIO
Triglycerides: 177 mg/dL — ABNORMAL HIGH (ref <150)
VLDL: 35 mg/dL (ref 0–40)

## 2018-06-20 LAB — BASIC METABOLIC PANEL
Anion gap: 8 (ref 5–15)
BUN: 25 mg/dL — ABNORMAL HIGH (ref 8–23)
CO2: 23 mmol/L (ref 22–32)
Calcium: 8.6 mg/dL — ABNORMAL LOW (ref 8.9–10.3)
Chloride: 106 mmol/L (ref 98–111)
Creatinine, Ser: 2.05 mg/dL — ABNORMAL HIGH (ref 0.61–1.24)
GFR calc Af Amer: 35 mL/min — ABNORMAL LOW (ref >60)
GFR calc non Af Amer: 30 mL/min — ABNORMAL LOW (ref >60)
Glucose, Bld: 113 mg/dL — ABNORMAL HIGH (ref 70–99)
Potassium: 3.7 mmol/L (ref 3.5–5.1)
SODIUM: 137 mmol/L (ref 135–145)

## 2018-06-20 LAB — VITAMIN B12: Vitamin B-12: 488 pg/mL (ref 180–914)

## 2018-06-20 LAB — HEMOGLOBIN A1C
Hgb A1c MFr Bld: 6 % — ABNORMAL HIGH (ref 4.8–5.6)
Mean Plasma Glucose: 125.5 mg/dL

## 2018-06-20 LAB — ECHOCARDIOGRAM COMPLETE
HEIGHTINCHES: 67 in
WEIGHTICAEL: 2880 [oz_av]

## 2018-06-20 MED ORDER — ASPIRIN 81 MG PO CHEW
81.0000 mg | CHEWABLE_TABLET | ORAL | Status: AC
  Administered 2018-06-21: 81 mg via ORAL
  Filled 2018-06-20: qty 1

## 2018-06-20 MED ORDER — SODIUM CHLORIDE 0.9 % IV SOLN
INTRAVENOUS | Status: DC
  Administered 2018-06-20 – 2018-06-21 (×2): via INTRAVENOUS

## 2018-06-20 MED ORDER — SODIUM CHLORIDE 0.9% FLUSH
3.0000 mL | Freq: Two times a day (BID) | INTRAVENOUS | Status: DC
  Administered 2018-06-20 (×2): 3 mL via INTRAVENOUS

## 2018-06-20 MED ORDER — SODIUM CHLORIDE 0.9 % IV SOLN: 250.0000 mL | INTRAVENOUS | Status: DC | PRN

## 2018-06-20 MED ORDER — SODIUM CHLORIDE 0.9% FLUSH: 3.0000 mL | INTRAVENOUS | Status: DC | PRN

## 2018-06-20 MED ORDER — ASPIRIN EC 81 MG PO TBEC
81.0000 mg | DELAYED_RELEASE_TABLET | Freq: Every day | ORAL | Status: DC
  Administered 2018-06-20: 81 mg via ORAL
  Filled 2018-06-20: qty 1

## 2018-06-20 MED ORDER — ASPIRIN EC 81 MG PO TBEC
81.0000 mg | DELAYED_RELEASE_TABLET | Freq: Every day | ORAL | Status: DC
  Administered 2018-06-22 – 2018-06-26 (×5): 81 mg via ORAL
  Filled 2018-06-20 (×5): qty 1

## 2018-06-20 NOTE — Consult Note (Addendum)
Cardiology Consult    Patient ID: AVYAAN SUMMER MRN: [id number], DOB/AGE: 1941/01/12   Admit date: 06/19/2018 Date of Consult: 06/20/2018  Primary Physician: Marin Olp, MD Primary Cardiologist: No primary care provider on file. Requesting Provider: Royal Piedra, MD  Patient Profile    Kevin [name] is a 78 y.o. male with a history of hypertension, hyperlipidemia, bradycardia, open AAA repair, bilateral carotid artery occlusion s/p endarterectomy, TIA x2 on Plavix, rheumatoid arthritis, CKD stage III-IV , who is being seen today for the evaluation of chest pain at the request of Dr. Steffanie Dunn.  History of Present Illness    Kevin [name] is a 78 year old male with the above history who was previously followed by Dr. Curt [name]. Last Myoview in 09/2015 was considered low risk with a small defect of mild severity noted in the mid inferolateral and apical lateral location consistent with mild ischemia. Patient last saw Dr. Curt [name] in 02/2016 at which time he reported some dizziness with standing but denies any other cardiac symptoms. Felt to be related to orthostasis given bradycardia and carotid vascular disease.   Patient presented to the ED yesterday for further evaluation of chest pain. Patient reports intermittent episodes low left sided/epigastric pain at rest for the past 2-3 weeks. Patient describes the pain as an "ache" and ranks it as a 4-7/10. No associated diaphoresis, shortness of breath, palpitations, nausea, or vomiting.  Episodes of pain last anywhere from 30 minutes to 1 hour. He sometimes has the episodes on a daily basis and sometimes occurs every 2-3 days. Patient wife thinks this episodes have been occurring more frequently. Wife is not currently present but per H&P, wife states she has seen him holding his chest more often the last 2-3 days. Patient denies any recent exertional chest pain or shortness of breath.  He does report some dizziness with standing but this is not new for  him.  No recent fevers or illnesses. Patient just attributed his symptoms to normal aches and pains that come with age. He called to schedule an appointment with his PCP and when they heard about his symptoms, they recommended he come to the ED for further evaluation.   In the ED: EKG showed no acute ischemic changes compared to prior tracings. Troponin negative. Chest x-ray showed no acute findings. WBC 6.3, Hgb 10.8, Plts 214. Na 138, K 3.7, Glucose 156, SCr 2.09.  At the time of this evaluation, patient notes very mild chest pressure.   He has a history of previous tobacco use but quit in the 1980's. He denies any other alcohol or illicit drug use. He has no known family history of CAD.  Past Medical History   Past Medical History:  Diagnosis Date  . Arthritis    DDD- Lower Back  . Bradycardia   . CAP (community acquired pneumonia) 03/17/2015   "THAT'S WHAT THEY ARE THINKING; THEY ARE NOT SURE"  . Carotid artery occlusion   . Chest pain 06/19/2018  . Chronic lower back pain    DDD  . CKD (chronic kidney disease)    Dr. Corliss Parish  . Diverticulosis   . Enlarged prostate    takes Flomax daily  . History of blood transfusion    "probably when they did aortic aneurysm"  . History of colon polyps    benign  . Hyperlipidemia   . Hypertension    takes Amlodipine and Zebeta daily  . Insomnia    takes Melatonin nightly  . Joint pain   .  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     . Muscle spasm    takes Zanaflex daily as needed  . Peripheral edema    takes Furosemide daily  . Pneumonia    hx of   . Rheumatic fever 1946  . Rheumatoid arthritis ([place])   . Short-term memory loss    minimal  . Shortness of breath dyspnea    with exertion but lasts very short period of time  . TIA (transient ischemic attack)    x 2;takes Plavix daily  . Urinary frequency    takes Flomax daily  . Urinary urgency     Past Surgical History:  Procedure Laterality  Date  . ABDOMINAL AORTIC ANEURYSM REPAIR  2010  . CAROTID ENDARTERECTOMY Left 09/20/2004  . CATARACT EXTRACTION, BILATERAL     2016-2017  . COLONOSCOPY    . ENDARTERECTOMY Right 10/12/2015   Procedure: RIGHT CAROTID ENDARTERECTOMY WITH PATCH ANGIOPLASTY;  Surgeon: Elam Dutch, MD;  Location: [place];  Service: Vascular;  Laterality: Right;  . INGUINAL HERNIA REPAIR Left 02/05/2013   Procedure: HERNIA REPAIR INGUINAL ADULT;  Surgeon: Joyice Faster. Cornett, MD;  Location: [place];  Service: General;  Laterality: Left;  . INGUINAL HERNIA REPAIR Left   . INSERTION OF MESH Left 02/05/2013   Procedure: INSERTION OF MESH;  Surgeon: Joyice Faster. Cornett, MD;  Location: [place];  Service: General;  Laterality: Left;  . SHOULDER ARTHROSCOPY WITH ROTATOR CUFF REPAIR AND SUBACROMIAL DECOMPRESSION Left 05/01/2014   Procedure: LEFT ARTHROSCOPY SHOULDER SUBACROMIAL DECOMPRESSION,DISTAL CLAVICAL RESECTION AND ROTATOR CUFF REPAIR;  Surgeon: Marin Shutter, MD;  Location: [facility];  Service: Orthopedics;  Laterality: Left;  . TESTICLAR CYST EXCISION Left   . TONSILLECTOMY    . VASECTOMY       Allergies  No Known Allergies  Inpatient Medications    . amLODipine  10 mg Oral Daily  . aspirin EC  81 mg Oral Daily  . atorvastatin  20 mg Oral Daily  . cholecalciferol  1,000 Units Oral Daily  . clopidogrel  75 mg Oral Daily  . diazepam  2.5-5 mg Oral QHS  . enoxaparin (LOVENOX) injection  30 mg Subcutaneous Q24H  . fenofibrate  160 mg Oral Daily  . furosemide  20 mg Oral Q M,W,F  . gabapentin  300 mg Oral BID  . Melatonin  9 mg Oral QHS  . tamsulosin  0.4 mg Oral QHS  . tiZANidine  4 mg Oral QHS  . vitamin B-12  1,000 mcg Oral Daily    Family History    Family History  Problem Relation Age of Onset  . Parkinsonism Father   . Dementia Father   . Cancer Father        Throat  . Colon cancer Mother        died in 34s  . Cancer Mother        Colon   He indicated that  his mother is deceased. He indicated that his father is deceased. He indicated that his son is alive.   Social History    Social History   Socioeconomic History  . Marital status: Married    Spouse name: Not on file  . Number of children: Not on file  . Years of education: Not on file  . Highest education level: Not on file  Occupational History  . Occupation: retired    Comment: maintenance; electrician  Social Needs  . Financial resource strain:  Not on file  . Food insecurity:    Worry: Not on file    Inability: Not on file  . Transportation needs:    Medical: Not on file    Non-medical: Not on file  Tobacco Use  . Smoking status: Former Smoker    Packs/day: 2.00    Years: 28.00    Pack years: 56.00    Types: Cigarettes    Last attempt to quit: 05/31/1987    Years since quitting: 31.0  . Smokeless tobacco: Never Used  . Tobacco comment: quit smoking "in my 10's"  Substance and Sexual Activity  . Alcohol use: Yes    Alcohol/week: 0.0 standard drinks    Comment: rare, mikes hard lemonade  . Drug use: No    Types: Marijuana    Comment: hx of-4-5 yrs ago  . Sexual activity: Not Currently  Lifestyle  . Physical activity:    Days per week: Not on file    Minutes per session: Not on file  . Stress: Not on file  Relationships  . Social connections:    Talks on phone: Not on file    Gets together: Not on file    Attends religious service: Not on file    Active member of club or organization: Not on file    Attends meetings of clubs or organizations: Not on file    Relationship status: Not on file  . Intimate partner violence:    Fear of current or ex partner: Not on file    Emotionally abused: Not on file    Physically abused: Not on file    Forced sexual activity: Not on file  Other Topics Concern  . Not on file  Social History Narrative   Married 33 years in 2015. Lived together for 12 years. 2 boys from first wife. 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       Hobbies: yardwork, [place] barn, woodwork, Orthoptist     Review of Systems    Review of Systems  Constitutional: Negative for chills, diaphoresis and fever.  HENT: Negative for congestion and sore throat.   Eyes: Negative for blurred vision and double vision.  Respiratory: Positive for cough. Negative for hemoptysis and shortness of breath.   Cardiovascular: Positive for chest pain and leg swelling. Negative for palpitations, orthopnea and PND.  Gastrointestinal: Negative for blood in stool, nausea and vomiting.  Genitourinary: Negative for hematuria.  Musculoskeletal: Positive for joint pain (hip arthritis). Negative for myalgias.  Neurological: Positive for dizziness (with standing).  Endo/Heme/Allergies: Bruises/bleeds easily (easy bruising but no abnormal bleeding).  Psychiatric/Behavioral: Negative for substance abuse (former tobacco use, quit in the 1980's).    Physical Exam    Blood pressure (!) 135/55, pulse (!) 57, temperature (!) 97.5 F (36.4 C), temperature source Oral, resp. rate 18, height 5\' 7"  (1.702 m), weight 81.6 kg, SpO2 99 %.  General: 78 y.o. Caucasian male resting comfortably in no acute distress. Pleasant and cooperative. HEENT: Normal  Neck: Supple. No carotid bruits or JVD appreciated. Lungs: No increased work of breathing. Clear to auscultation bilaterally. No wheezes, rhonchi, or rales. Heart: Mildly bradycardic with regular rhythm. Distinct S1 and S2. No significant murmurs, gallops, or rubs.  Abdomen: Soft, non-distended, and non-tender to palpation. Bowel sounds present. Extremities: No lower extremity edema. Radial pulses 2+ and  equal bilaterally. Skin: Warm and dry.  Neuro: Alert and oriented x3. No focal deficits. Moves all extremities spontaneously. Psych: Normal affect.  Labs    Troponin (Point of Care Test) No results  for input(s): TROPIPOC in the last 72 hours. Recent Labs    06/19/18 1134 06/19/18 1524 06/19/18 1853  TROPONINI <0.03 <0.03 <0.03   Lab Results  Component Value Date   WBC 5.8 06/20/2018   HGB 10.7 (L) 06/20/2018   HCT 32.5 (L) 06/20/2018   MCV 90.0 06/20/2018   PLT 200 06/20/2018    Recent Labs  Lab 06/20/18 0732  NA 137  K 3.7  CL 106  CO2 23  BUN 25*  CREATININE 2.05*  CALCIUM 8.6*  GLUCOSE 113*   Lab Results  Component Value Date   CHOL 113 06/20/2018   HDL 23 (L) 06/20/2018   LDLCALC 55 06/20/2018   TRIG 177 (H) 06/20/2018   Lab Results  Component Value Date   DDIMER (H) 04/07/2009    3.21        AT THE INHOUSE ESTABLISHED CUTOFF VALUE OF 0.48 ug/mL FEU, THIS ASSAY HAS BEEN DOCUMENTED IN THE LITERATURE TO HAVE A SENSITIVITY AND NEGATIVE PREDICTIVE VALUE OF AT LEAST 98 TO 99%.  THE TEST RESULT SHOULD BE CORRELATED WITH AN ASSESSMENT OF THE CLINICAL PROBABILITY OF DVT / VTE.     Radiology Studies    Dg Chest 2 View  Result Date: 06/19/2018 CLINICAL DATA:  Chest pain x2 weeks EXAM: CHEST - 2 VIEW COMPARISON:  10/06/2016 FINDINGS: Lungs are clear.  No pleural effusion or pneumothorax. The heart is normal in size. Degenerative changes of the visualized thoracolumbar spine. IMPRESSION: Normal chest radiographs. Electronically Signed   By: Julian Hy M.D.   On: 06/19/2018 11:19    EKG     EKG: EKG was personally reviewed and demonstrates: Sinus bradycardia, rate 49 bpm, with T wave inversions in leads III (seen on previous tracings) and 1st degree AV block.  Telemetry: Telemetry was personally reviewed and demonstrates: Sinus bradycardia with heart rates in the 40's to 50's.  Cardiac Imaging    Lexiscan Myoview 09/29/2015:  Nuclear stress EF: 54%.  There was no ST segment deviation noted during stress.  No T wave inversion was noted during stress.  Defect 1: There is a small defect of mild severity present in the mid inferolateral and  apical lateral location.  Findings consistent with mild ischemia.  This is a low risk study.  The left ventricular ejection fraction is mildly decreased (45-54%). _______________  Echocardiogram 06/30/2015: Study Conclusions: - Left ventricle: The cavity size was normal. Wall thickness was   increased in a pattern of mild LVH. Systolic function was normal.   The estimated ejection fraction was in the range of 55% to 60%.   Wall motion was normal; there were no regional wall motion   abnormalities. Doppler parameters are consistent with abnormal   left ventricular relaxation (grade 1 diastolic dysfunction). - Aortic valve: Trileaflet; moderately calcified leaflets.   Sclerosis without stenosis. - Mitral valve: Moderately calcified annulus. There was trivial   regurgitation. - Left atrium: The atrium was mildly dilated. - Right ventricle: The cavity size was normal. - Pulmonary arteries: No complete TR doppler jet so unable to   estimate PA systolic pressure. - Inferior vena cava: The vessel was normal in size. The   respirophasic diameter changes were in the normal range (= 50%),   consistent with normal central venous pressure.  Impressions: -  Normal LV size with mild LV hypertrophy. EF 55-60%. Normal RV   size and systolic function. Aortic sclerosis without significant   stenosis.  Assessment & Plan    Chest Pain - Patient presents for intermittent chest pain at rest for the past 2-3 weeks. Wife reportedly thinks these episodes have been occurring more frequently over the last couple of days. Patient denies any exertional symptoms. - EKG showed no acute ischemic changes compared to prior tracings. - Troponin negative x3. - Patient continues to report some mild chest pressure at this time. - Continue Aspirin and Plavix. Patient on Plavix due to multiple TIAs. - Hypertension - BP currently well controlled. - Continue home Amlodipine 10mg  daily.  Hyperlipidemia - LDL  this admission: 55. - Continue home Lipitor.  Bradycardia - EKG this morning shows sinus bradycardia, rate 49 bpm, with 1st degree AV block. - Heart rates in the high 40's to 50's on telemetry. - Patient on Propranolol at home. This was held on admission. - Patient asymptomatic.  Normocytic Anemia - Hemoglobin 10.8 on admission. Was 11.5 three months ago. - Per primary team.  Chronic Kidney Disease Stage III-IV - Serum creatinine 2.09 on admission. Baseline appears to be 1.9 to 2.2. - Creatinine stable today at 2.05.  - Continue to monitor.  - Followed by Nephrology (Dr. Moshe Cipro) as outpatient.  Signed, Darreld Mclean, PA-C 06/20/2018, 8:37 AM   PT seen and examined  I agree with findings as noted above by C Goodrich Pt is a 78 yo with diffuse vascluar dz   Presents with CP    Has noticed episodes over past few wks    He is not that active   Wife says he is more fatigued  ON exam, pt is in NAD Neck   S/p R CEA Lungs aer CTA   Cardiac RRR   No S3  No murmurs  Ext are without edema  Echo shows overall normal LV function (preliminarly) Very mild restriction in AV  I am concerned that CP represents progressive angina    I would reocmm L heart cath to define anatomy    Risks / benefits explained   Pt understands and agrees to proceed   WIll plan for tomorrow   Hydrate today.  Dorris Carnes  For questions or updates, please contact   Please consult [email or web address] for contact info under Cardiology/STEMI.

## 2018-06-20 NOTE — Progress Notes (Signed)
Progress Note    Kevin [name]  HAL:[id number] DOB: [birth year]  DOA: 06/19/2018 PCP: Marin Olp, MD    Brief Narrative:   Chief complaint: chest discomfort  Medical records reviewed and are as summarized below:  Kevin [name] is an 78 y.o. male pmh HTN, CKD III, hyperlipidemia, CAS s/p bilateral CEA, h/o CVA, h/o AAA s/p repair, chronic pain 2/2 arht who presented to the ED today with c/o chest discomfort.  Assessment/Plan:   Principal Problem:   Chest pain Active Problems:   Hyperlipidemia   UNSPECIFIED ANEMIA   Essential hypertension   BPH (benign prostatic hyperplasia)   Chronic pain syndrome   History of CVA (cerebrovascular accident)   Carotid artery stenosis s/p L carotid endarterectomy   CKD (chronic kidney disease), stage III (HCC)   Diastolic CHF (HCC)   PAD (peripheral artery disease) (HCC)  Chest pain, CAD: Patient presents with complaints of chest discomfort that he describes as "pressure" with associated symptoms of nausea.  Troponin was negative x3.  Chest pain symptoms improved with nitroglycerin.  Previous history of low risk Myoview in 2017. -Continue aspirin and Plavix -Echo revealed: EF of 60 to 65% with grade 1 diastolic dysfunction and high ventricular filling pressure.  There was mildly dilated left atrium and mild concentric hypertrophy of the left ventricle. -[place] cardiology consultative services plan for cardiac cath in a.m. -Cath Orders per cardiology -N.p.o. after midnight  Chronic kidney disease stage IV: Patient followed in outpatient setting by Dr. Vanetta Mulders of nephrology.  Currently creatinine noted to be 2.05 which appears patient's baseline of 1.9-2.1. -IV fluids overnight -Repeat BMP in a.m.  Normocytic anemia: Hemoglobin 10.8.  Vitamin D normal at 735. -Follow-up folic acid -Recheck CBC in a.m.  Chronic pain: Patient on oxycodone at home.  Dyslipidemia: Lipid panel revealed total cholesterol 113, HDL 23,  LDL 55, and triglycerides 177.  Patient's LDL appears less than 70, but HDL and triglycerides do not appear at target. -Continue Lipitor and fenofibrate  Essential hypertension -Held propranolol and furosemide, will restart when medically approved -Continue amlodipine  Tremor -Propranolol had been initially held for possible need of stress testing.  Restart when medically appropriate  BPH -Continue Flomax  Prediabetes: Hemoglobin A1c 6  Body mass index is 27.07 kg/m.   Family Communication/Anticipated D/C date and plan/Code Status   DVT prophylaxis: Lovenox ordered. Code Status: Full Code.  Family Communication: Discussed plan of care with the patient family present at bedside Disposition Plan: To be determined   Medical Consultants:    Cardiology.   Anti-Infectives:    None  Subjective:   Patient still reports having intermittent chest discomfort which he rates as a 4 out of 10 on the pain scale.  Objective:    Vitals:   06/20/18 1020 06/20/18 1200 06/20/18 1411 06/20/18 1912  BP: (!) 152/64  133/73 113/65  Pulse: (!) 51  67 61  Resp: 20  18 18   Temp: (!) 97.3 F (36.3 C)  (!) 97.5 F (36.4 C) 98 F (36.7 C)  TempSrc: Oral  Oral Oral  SpO2: 100%  98% 98%  Weight:  78.4 kg    Height:  5\' 7"  (1.702 m)      Intake/Output Summary (Last 24 hours) at 06/20/2018 2134 Last data filed at 06/20/2018 2100 Gross per 24 hour  Intake 808.1 ml  Output -  Net 808.1 ml   Filed Weights   06/19/18 1043 06/20/18 1200  Weight: 81.6 kg  78.4 kg    Exam: Constitutional: NAD, calm, comfortable Eyes: PERRL, lids and conjunctivae normal ENMT: Mucous membranes are moist. Posterior pharynx clear of any exudate or lesions.Normal dentition.  Neck: normal, supple, no masses, no thyromegaly Respiratory: clear to auscultation bilaterally, no wheezing, no crackles. Normal respiratory effort. No accessory muscle use.  Cardiovascular: Regular rate and rhythm, no murmurs / rubs  / gallops. No extremity edema. 2+ pedal pulses. No carotid bruits.  Abdomen: no tenderness, no masses palpated. No hepatosplenomegaly. Bowel sounds positive.  Musculoskeletal: no clubbing / cyanosis. No joint deformity upper and lower extremities. Good ROM, no contractures. Normal muscle tone.  Skin: no rashes, lesions, ulcers. No induration Neurologic: CN 2-12 grossly intact. Sensation intact, DTR normal. Strength 5/5 in all 4.  Tremor present Psychiatric: Normal judgment and insight. Alert and oriented x 3. Normal mood.    Data Reviewed:   I have personally reviewed following labs and imaging studies:  Labs: Labs show the following:   Basic Metabolic Panel: Recent Labs  Lab 06/19/18 1134 06/20/18 0732  NA 138 137  K 3.7 3.7  CL 105 106  CO2 21* 23  GLUCOSE 156* 113*  BUN 26* 25*  CREATININE 2.09* 2.05*  CALCIUM 8.7* 8.6*   GFR Estimated Creatinine Clearance: 27.3 mL/min (A) (by C-G formula based on SCr of 2.05 mg/dL (H)). Liver Function Tests: No results for input(s): AST, ALT, ALKPHOS, BILITOT, PROT, ALBUMIN in the last 168 hours. No results for input(s): LIPASE, AMYLASE in the last 168 hours. No results for input(s): AMMONIA in the last 168 hours. Coagulation profile No results for input(s): INR, PROTIME in the last 168 hours.  CBC: Recent Labs  Lab 06/19/18 1134 06/20/18 0732  WBC 6.3 5.8  NEUTROABS 3.0 2.7  HGB 10.8* 10.7*  HCT 33.7* 32.5*  MCV 92.6 90.0  PLT 214 200   Cardiac Enzymes: Recent Labs  Lab 06/19/18 1134 06/19/18 1524 06/19/18 1853  TROPONINI <0.03 <0.03 <0.03   BNP (last 3 results) No results for input(s): PROBNP in the last 8760 hours. CBG: No results for input(s): GLUCAP in the last 168 hours. D-Dimer: No results for input(s): DDIMER in the last 72 hours. Hgb A1c: Recent Labs    06/20/18 0519  HGBA1C 6.0*   Lipid Profile: Recent Labs    06/20/18 0519  CHOL 113  HDL 23*  LDLCALC 55  TRIG 177*  CHOLHDL 4.9   Thyroid  function studies: No results for input(s): TSH, T4TOTAL, T3FREE, THYROIDAB in the last 72 hours.  Invalid input(s): FREET3 Anemia work up: Recent Labs    06/20/18 [place] 488   Sepsis Labs: Recent Labs  Lab 06/19/18 1134 06/20/18 0732  WBC 6.3 5.8    Microbiology No results found for this or any previous visit (from the past 240 hour(s)).  Procedures and diagnostic studies:  Dg Chest 2 View  Result Date: 06/19/2018 CLINICAL DATA:  Chest pain x2 weeks EXAM: CHEST - 2 VIEW COMPARISON:  10/06/2016 FINDINGS: Lungs are clear.  No pleural effusion or pneumothorax. The heart is normal in size. Degenerative changes of the visualized thoracolumbar spine. IMPRESSION: Normal chest radiographs. Electronically Signed   By: Julian Hy M.D.   On: 06/19/2018 11:19    Medications:   . amLODipine  10 mg Oral Daily  . [START ON 06/21/2018] aspirin  81 mg Oral Pre-Cath  . [START ON 06/22/2018] aspirin EC  81 mg Oral Daily  . atorvastatin  20 mg Oral Daily  . cholecalciferol  1,000 Units Oral Daily  . clopidogrel  75 mg Oral Daily  . diazepam  2.5-5 mg Oral QHS  . enoxaparin (LOVENOX) injection  30 mg Subcutaneous Q24H  . fenofibrate  160 mg Oral Daily  . furosemide  20 mg Oral Q M,W,F  . gabapentin  300 mg Oral BID  . Melatonin  9 mg Oral QHS  . sodium chloride flush  3 mL Intravenous Q12H  . tamsulosin  0.4 mg Oral QHS  . tiZANidine  4 mg Oral QHS  . vitamin B-12  1,000 mcg Oral Daily   Continuous Infusions: . sodium chloride    . sodium chloride 70 mL/hr at 06/20/18 1504     LOS: 0 days   Rondell A Smith  [facility]   *Please refer to [email or web address], password [email or web address] to get updated schedule on who will round on this patient, as hospitalists switch teams weekly. If 7PM-7AM, please contact night-coverage at [email or web address], password [email or web address] for any overnight needs.

## 2018-06-20 NOTE — Progress Notes (Signed)
  Echocardiogram 2D Echocardiogram has been performed.  Kevin [name] 06/20/2018, 11:37 AM

## 2018-06-20 NOTE — Progress Notes (Signed)
Pt called out w/ chest pain on left side.  4/10.  Does not want morphine, zofran or percocet for the pain.  EKG performed.  Dr. Tamala Julian made aware.

## 2018-06-20 NOTE — H&P (View-Only) (Signed)
Cardiology Consult    Patient ID: Kevin [name] MRN: [id number], DOB/AGE: Jun 04, 1940   Admit date: 06/19/2018 Date of Consult: 06/20/2018  Primary Physician: Marin Olp, MD Primary Cardiologist: No primary care provider on file. Requesting Provider: Royal Piedra, MD  Patient Profile    Kevin [name] is a 78 y.o. male with a history of hypertension, hyperlipidemia, bradycardia, open AAA repair, bilateral carotid artery occlusion s/p endarterectomy, TIA x2 on Plavix, rheumatoid arthritis, CKD stage III-IV , who is being seen today for the evaluation of chest pain at the request of Dr. Steffanie Dunn.  History of Present Illness    Kevin [name] is a 78 year old male with the above history who was previously followed by Dr. Curt Bears. Last Myoview in 09/2015 was considered low risk with a small defect of mild severity noted in the mid inferolateral and apical lateral location consistent with mild ischemia. Patient last saw Dr. Curt Bears in 02/2016 at which time he reported some dizziness with standing but denies any other cardiac symptoms. Felt to be related to orthostasis given bradycardia and carotid vascular disease.   Patient presented to the ED yesterday for further evaluation of chest pain. Patient reports intermittent episodes low left sided/epigastric pain at rest for the past 2-3 weeks. Patient describes the pain as an "ache" and ranks it as a 4-7/10. No associated diaphoresis, shortness of breath, palpitations, nausea, or vomiting.  Episodes of pain last anywhere from 30 minutes to 1 hour. He sometimes has the episodes on a daily basis and sometimes occurs every 2-3 days. Patient wife thinks this episodes have been occurring more frequently. Wife is not currently present but per H&P, wife states she has seen him holding his chest more often the last 2-3 days. Patient denies any recent exertional chest pain or shortness of breath.  He does report some dizziness with standing but this is not new for  him.  No recent fevers or illnesses. Patient just attributed his symptoms to normal aches and pains that come with age. He called to schedule an appointment with his PCP and when they heard about his symptoms, they recommended he come to the ED for further evaluation.   In the ED: EKG showed no acute ischemic changes compared to prior tracings. Troponin negative. Chest x-ray showed no acute findings. WBC 6.3, Hgb 10.8, Plts 214. Na 138, K 3.7, Glucose 156, SCr 2.09.  At the time of this evaluation, patient notes very mild chest pressure.   He has a history of previous tobacco use but quit in the 1980's. He denies any other alcohol or illicit drug use. He has no known family history of CAD.  Past Medical History   Past Medical History:  Diagnosis Date  . Arthritis    DDD- Lower Back  . Bradycardia   . CAP (community acquired pneumonia) 03/17/2015   "THAT'S WHAT THEY ARE THINKING; THEY ARE NOT SURE"  . Carotid artery occlusion   . Chest pain 06/19/2018  . Chronic lower back pain    DDD  . CKD (chronic kidney disease)    Dr. Corliss Parish  . Diverticulosis   . Enlarged prostate    takes Flomax daily  . History of blood transfusion    "probably when they did aortic aneurysm"  . History of colon polyps    benign  . Hyperlipidemia   . Hypertension    takes Amlodipine and Zebeta daily  . Insomnia    takes Melatonin nightly  . Joint pain   .  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     . Muscle spasm    takes Zanaflex daily as needed  . Peripheral edema    takes Furosemide daily  . Pneumonia    hx of   . Rheumatic fever 1946  . Rheumatoid arthritis ([place])   . Short-term memory loss    minimal  . Shortness of breath dyspnea    with exertion but lasts very short period of time  . TIA (transient ischemic attack)    x 2;takes Plavix daily  . Urinary frequency    takes Flomax daily  . Urinary urgency     Past Surgical History:  Procedure Laterality  Date  . ABDOMINAL AORTIC ANEURYSM REPAIR  2010  . CAROTID ENDARTERECTOMY Left 09/20/2004  . CATARACT EXTRACTION, BILATERAL     2016-2017  . COLONOSCOPY    . ENDARTERECTOMY Right 10/12/2015   Procedure: RIGHT CAROTID ENDARTERECTOMY WITH PATCH ANGIOPLASTY;  Surgeon: Elam Dutch, MD;  Location: [facility];  Service: Vascular;  Laterality: Right;  . INGUINAL HERNIA REPAIR Left 02/05/2013   Procedure: HERNIA REPAIR INGUINAL ADULT;  Surgeon: Joyice Faster. Cornett, MD;  Location: [facility];  Service: General;  Laterality: Left;  . INGUINAL HERNIA REPAIR Left   . INSERTION OF MESH Left 02/05/2013   Procedure: INSERTION OF MESH;  Surgeon: Joyice Faster. Cornett, MD;  Location: [facility];  Service: General;  Laterality: Left;  . SHOULDER ARTHROSCOPY WITH ROTATOR CUFF REPAIR AND SUBACROMIAL DECOMPRESSION Left 05/01/2014   Procedure: LEFT ARTHROSCOPY SHOULDER SUBACROMIAL DECOMPRESSION,DISTAL CLAVICAL RESECTION AND ROTATOR CUFF REPAIR;  Surgeon: Marin Shutter, MD;  Location: [facility];  Service: Orthopedics;  Laterality: Left;  . TESTICLAR CYST EXCISION Left   . TONSILLECTOMY    . VASECTOMY       Allergies  No Known Allergies  Inpatient Medications    . amLODipine  10 mg Oral Daily  . aspirin EC  81 mg Oral Daily  . atorvastatin  20 mg Oral Daily  . cholecalciferol  1,000 Units Oral Daily  . clopidogrel  75 mg Oral Daily  . diazepam  2.5-5 mg Oral QHS  . enoxaparin (LOVENOX) injection  30 mg Subcutaneous Q24H  . fenofibrate  160 mg Oral Daily  . furosemide  20 mg Oral Q M,W,F  . gabapentin  300 mg Oral BID  . Melatonin  9 mg Oral QHS  . tamsulosin  0.4 mg Oral QHS  . tiZANidine  4 mg Oral QHS  . vitamin B-12  1,000 mcg Oral Daily    Family History    Family History  Problem Relation Age of Onset  . Parkinsonism Father   . Dementia Father   . Cancer Father        Throat  . Colon cancer Mother        died in 14s  . Cancer Mother        Colon   He indicated that  his mother is deceased. He indicated that his father is deceased. He indicated that his son is alive.   Social History    Social History   Socioeconomic History  . Marital status: Married    Spouse name: Not on file  . Number of children: Not on file  . Years of education: Not on file  . Highest education level: Not on file  Occupational History  . Occupation: retired    Comment: maintenance; electrician  Social Needs  . Financial resource strain:  Not on file  . Food insecurity:    Worry: Not on file    Inability: Not on file  . Transportation needs:    Medical: Not on file    Non-medical: Not on file  Tobacco Use  . Smoking status: Former Smoker    Packs/day: 2.00    Years: 28.00    Pack years: 56.00    Types: Cigarettes    Last attempt to quit: 05/31/1987    Years since quitting: 31.0  . Smokeless tobacco: Never Used  . Tobacco comment: quit smoking "in my 52's"  Substance and Sexual Activity  . Alcohol use: Yes    Alcohol/week: 0.0 standard drinks    Comment: rare, mikes hard lemonade  . Drug use: No    Types: Marijuana    Comment: hx of-4-5 yrs ago  . Sexual activity: Not Currently  Lifestyle  . Physical activity:    Days per week: Not on file    Minutes per session: Not on file  . Stress: Not on file  Relationships  . Social connections:    Talks on phone: Not on file    Gets together: Not on file    Attends religious service: Not on file    Active member of club or organization: Not on file    Attends meetings of clubs or organizations: Not on file    Relationship status: Not on file  . Intimate partner violence:    Fear of current or ex partner: Not on file    Emotionally abused: Not on file    Physically abused: Not on file    Forced sexual activity: Not on file  Other Topics Concern  . Not on file  Social History Narrative   Married 33 years in 2015. Lived together for 12 years. 2 boys from first wife. 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       Hobbies: yardwork, [place] barn, woodwork, Orthoptist     Review of Systems    Review of Systems  Constitutional: Negative for chills, diaphoresis and fever.  HENT: Negative for congestion and sore throat.   Eyes: Negative for blurred vision and double vision.  Respiratory: Positive for cough. Negative for hemoptysis and shortness of breath.   Cardiovascular: Positive for chest pain and leg swelling. Negative for palpitations, orthopnea and PND.  Gastrointestinal: Negative for blood in stool, nausea and vomiting.  Genitourinary: Negative for hematuria.  Musculoskeletal: Positive for joint pain (hip arthritis). Negative for myalgias.  Neurological: Positive for dizziness (with standing).  Endo/Heme/Allergies: Bruises/bleeds easily (easy bruising but no abnormal bleeding).  Psychiatric/Behavioral: Negative for substance abuse (former tobacco use, quit in the 1980's).    Physical Exam    Blood pressure (!) 135/55, pulse (!) 57, temperature (!) 97.5 F (36.4 C), temperature source Oral, resp. rate 18, height 5\' 7"  (1.702 m), weight 81.6 kg, SpO2 99 %.  General: 78 y.o. Caucasian male resting comfortably in no acute distress. Pleasant and cooperative. HEENT: Normal  Neck: Supple. No carotid bruits or JVD appreciated. Lungs: No increased work of breathing. Clear to auscultation bilaterally. No wheezes, rhonchi, or rales. Heart: Mildly bradycardic with regular rhythm. Distinct S1 and S2. No significant murmurs, gallops, or rubs.  Abdomen: Soft, non-distended, and non-tender to palpation. Bowel sounds present. Extremities: No lower extremity edema. Radial pulses 2+ and  equal bilaterally. Skin: Warm and dry.  Neuro: Alert and oriented x3. No focal deficits. Moves all extremities spontaneously. Psych: Normal affect.  Labs    Troponin (Point of Care Test) No results  for input(s): TROPIPOC in the last 72 hours. Recent Labs    06/19/18 1134 06/19/18 1524 06/19/18 1853  TROPONINI <0.03 <0.03 <0.03   Lab Results  Component Value Date   WBC 5.8 06/20/2018   HGB 10.7 (L) 06/20/2018   HCT 32.5 (L) 06/20/2018   MCV 90.0 06/20/2018   PLT 200 06/20/2018    Recent Labs  Lab 06/20/18 0732  NA 137  K 3.7  CL 106  CO2 23  BUN 25*  CREATININE 2.05*  CALCIUM 8.6*  GLUCOSE 113*   Lab Results  Component Value Date   CHOL 113 06/20/2018   HDL 23 (L) 06/20/2018   LDLCALC 55 06/20/2018   TRIG 177 (H) 06/20/2018   Lab Results  Component Value Date   DDIMER (H) 04/07/2009    3.21        AT THE INHOUSE ESTABLISHED CUTOFF VALUE OF 0.48 ug/mL FEU, THIS ASSAY HAS BEEN DOCUMENTED IN THE LITERATURE TO HAVE A SENSITIVITY AND NEGATIVE PREDICTIVE VALUE OF AT LEAST 98 TO 99%.  THE TEST RESULT SHOULD BE CORRELATED WITH AN ASSESSMENT OF THE CLINICAL PROBABILITY OF DVT / VTE.     Radiology Studies    Dg Chest 2 View  Result Date: 06/19/2018 CLINICAL DATA:  Chest pain x2 weeks EXAM: CHEST - 2 VIEW COMPARISON:  10/06/2016 FINDINGS: Lungs are clear.  No pleural effusion or pneumothorax. The heart is normal in size. Degenerative changes of the visualized thoracolumbar spine. IMPRESSION: Normal chest radiographs. Electronically Signed   By: Julian Hy M.D.   On: 06/19/2018 11:19    EKG     EKG: EKG was personally reviewed and demonstrates: Sinus bradycardia, rate 49 bpm, with T wave inversions in leads III (seen on previous tracings) and 1st degree AV block.  Telemetry: Telemetry was personally reviewed and demonstrates: Sinus bradycardia with heart rates in the 40's to 50's.  Cardiac Imaging    Lexiscan Myoview 09/29/2015:  Nuclear stress EF: 54%.  There was no ST segment deviation noted during stress.  No T wave inversion was noted during stress.  Defect 1: There is a small defect of mild severity present in the mid inferolateral and  apical lateral location.  Findings consistent with mild ischemia.  This is a low risk study.  The left ventricular ejection fraction is mildly decreased (45-54%). _______________  Echocardiogram 06/30/2015: Study Conclusions: - Left ventricle: The cavity size was normal. Wall thickness was   increased in a pattern of mild LVH. Systolic function was normal.   The estimated ejection fraction was in the range of 55% to 60%.   Wall motion was normal; there were no regional wall motion   abnormalities. Doppler parameters are consistent with abnormal   left ventricular relaxation (grade 1 diastolic dysfunction). - Aortic valve: Trileaflet; moderately calcified leaflets.   Sclerosis without stenosis. - Mitral valve: Moderately calcified annulus. There was trivial   regurgitation. - Left atrium: The atrium was mildly dilated. - Right ventricle: The cavity size was normal. - Pulmonary arteries: No complete TR doppler jet so unable to   estimate PA systolic pressure. - Inferior vena cava: The vessel was normal in size. The   respirophasic diameter changes were in the normal range (= 50%),   consistent with normal central venous pressure.  Impressions: -  Normal LV size with mild LV hypertrophy. EF 55-60%. Normal RV   size and systolic function. Aortic sclerosis without significant   stenosis.  Assessment & Plan    Chest Pain - Patient presents for intermittent chest pain at rest for the past 2-3 weeks. Wife reportedly thinks these episodes have been occurring more frequently over the last couple of days. Patient denies any exertional symptoms. - EKG showed no acute ischemic changes compared to prior tracings. - Troponin negative x3. - Patient continues to report some mild chest pressure at this time. - Continue Aspirin and Plavix. Patient on Plavix due to multiple TIAs. - Hypertension - BP currently well controlled. - Continue home Amlodipine 10mg  daily.  Hyperlipidemia - LDL  this admission: 55. - Continue home Lipitor.  Bradycardia - EKG this morning shows sinus bradycardia, rate 49 bpm, with 1st degree AV block. - Heart rates in the high 40's to 50's on telemetry. - Patient on Propranolol at home. This was held on admission. - Patient asymptomatic.  Normocytic Anemia - Hemoglobin 10.8 on admission. Was 11.5 three months ago. - Per primary team.  Chronic Kidney Disease Stage III-IV - Serum creatinine 2.09 on admission. Baseline appears to be 1.9 to 2.2. - Creatinine stable today at 2.05.  - Continue to monitor.  - Followed by Nephrology (Dr. Moshe Cipro) as outpatient.  Signed, Darreld Mclean, PA-C 06/20/2018, 8:37 AM   PT seen and examined  I agree with findings as noted above by C Goodrich Pt is a 78 yo with diffuse vascluar dz   Presents with CP    Has noticed episodes over past few wks    He is not that active   Wife says he is more fatigued  ON exam, pt is in NAD Neck   S/p R CEA Lungs aer CTA   Cardiac RRR   No S3  No murmurs  Ext are without edema  Echo shows overall normal LV function (preliminarly) Very mild restriction in AV  I am concerned that CP represents progressive angina    I would reocmm L heart cath to define anatomy    Risks / benefits explained   Pt understands and agrees to proceed   WIll plan for tomorrow   Hydrate today.  Dorris Carnes  For questions or updates, please contact   Please consult [email or web address] for contact info under Cardiology/STEMI.

## 2018-06-20 NOTE — Care Management Obs Status (Signed)
[facility] NOTIFICATION   Patient Details  Name: Kevin [name] MRN: [id number] Date of Birth: 03-29-1941   Medicare Observation Status Notification Given:  Yes    Erenest Rasher, RN 06/20/2018, 4:02 PM

## 2018-06-21 ENCOUNTER — Encounter: Payer: Medicare HMO | Admitting: Registered Nurse

## 2018-06-21 ENCOUNTER — Other Ambulatory Visit: Payer: Self-pay | Admitting: *Deleted

## 2018-06-21 ENCOUNTER — Encounter (HOSPITAL_COMMUNITY): Payer: Self-pay | Admitting: Interventional Cardiology

## 2018-06-21 ENCOUNTER — Encounter (HOSPITAL_COMMUNITY)
Admission: EM | Disposition: A | Discharge status: Home Health | Payer: Self-pay | Source: Home / Self Care | Attending: Thoracic Surgery (Cardiothoracic Vascular Surgery)

## 2018-06-21 DIAGNOSIS — I200 Unstable angina: Secondary | ICD-10-CM | POA: Diagnosis not present

## 2018-06-21 DIAGNOSIS — I25110 Atherosclerotic heart disease of native coronary artery with unstable angina pectoris: Secondary | ICD-10-CM | POA: Diagnosis present

## 2018-06-21 DIAGNOSIS — M069 Rheumatoid arthritis, unspecified: Secondary | ICD-10-CM | POA: Diagnosis present

## 2018-06-21 DIAGNOSIS — Z86010 Personal history of colonic polyps: Secondary | ICD-10-CM | POA: Diagnosis not present

## 2018-06-21 DIAGNOSIS — I4510 Unspecified right bundle-branch block: Secondary | ICD-10-CM | POA: Diagnosis not present

## 2018-06-21 DIAGNOSIS — D62 Acute posthemorrhagic anemia: Secondary | ICD-10-CM | POA: Diagnosis not present

## 2018-06-21 DIAGNOSIS — I480 Paroxysmal atrial fibrillation: Secondary | ICD-10-CM | POA: Diagnosis not present

## 2018-06-21 DIAGNOSIS — Z820 Family history of epilepsy and other diseases of the nervous system: Secondary | ICD-10-CM | POA: Diagnosis not present

## 2018-06-21 DIAGNOSIS — M1990 Unspecified osteoarthritis, unspecified site: Secondary | ICD-10-CM | POA: Diagnosis present

## 2018-06-21 DIAGNOSIS — Z79891 Long term (current) use of opiate analgesic: Secondary | ICD-10-CM | POA: Diagnosis not present

## 2018-06-21 DIAGNOSIS — I739 Peripheral vascular disease, unspecified: Secondary | ICD-10-CM | POA: Diagnosis present

## 2018-06-21 DIAGNOSIS — I2510 Atherosclerotic heart disease of native coronary artery without angina pectoris: Secondary | ICD-10-CM | POA: Diagnosis not present

## 2018-06-21 DIAGNOSIS — N183 Chronic kidney disease, stage 3 (moderate): Secondary | ICD-10-CM | POA: Diagnosis not present

## 2018-06-21 DIAGNOSIS — Z800 Family history of malignant neoplasm of digestive organs: Secondary | ICD-10-CM | POA: Diagnosis not present

## 2018-06-21 DIAGNOSIS — I130 Hypertensive heart and chronic kidney disease with heart failure and stage 1 through stage 4 chronic kidney disease, or unspecified chronic kidney disease: Secondary | ICD-10-CM | POA: Diagnosis present

## 2018-06-21 DIAGNOSIS — Z9841 Cataract extraction status, right eye: Secondary | ICD-10-CM | POA: Diagnosis not present

## 2018-06-21 DIAGNOSIS — R0789 Other chest pain: Secondary | ICD-10-CM | POA: Diagnosis present

## 2018-06-21 DIAGNOSIS — G894 Chronic pain syndrome: Secondary | ICD-10-CM | POA: Diagnosis present

## 2018-06-21 DIAGNOSIS — N184 Chronic kidney disease, stage 4 (severe): Secondary | ICD-10-CM | POA: Diagnosis present

## 2018-06-21 DIAGNOSIS — D649 Anemia, unspecified: Secondary | ICD-10-CM | POA: Diagnosis not present

## 2018-06-21 DIAGNOSIS — Z01810 Encounter for preprocedural cardiovascular examination: Secondary | ICD-10-CM | POA: Diagnosis not present

## 2018-06-21 DIAGNOSIS — E782 Mixed hyperlipidemia: Secondary | ICD-10-CM | POA: Diagnosis not present

## 2018-06-21 DIAGNOSIS — R079 Chest pain, unspecified: Secondary | ICD-10-CM | POA: Diagnosis not present

## 2018-06-21 DIAGNOSIS — I5032 Chronic diastolic (congestive) heart failure: Secondary | ICD-10-CM | POA: Diagnosis present

## 2018-06-21 DIAGNOSIS — J9811 Atelectasis: Secondary | ICD-10-CM | POA: Diagnosis not present

## 2018-06-21 DIAGNOSIS — N400 Enlarged prostate without lower urinary tract symptoms: Secondary | ICD-10-CM | POA: Diagnosis not present

## 2018-06-21 DIAGNOSIS — I208 Other forms of angina pectoris: Secondary | ICD-10-CM | POA: Diagnosis not present

## 2018-06-21 DIAGNOSIS — I714 Abdominal aortic aneurysm, without rupture: Secondary | ICD-10-CM | POA: Diagnosis not present

## 2018-06-21 DIAGNOSIS — Z8673 Personal history of transient ischemic attack (TIA), and cerebral infarction without residual deficits: Secondary | ICD-10-CM | POA: Diagnosis not present

## 2018-06-21 DIAGNOSIS — Z87891 Personal history of nicotine dependence: Secondary | ICD-10-CM | POA: Diagnosis not present

## 2018-06-21 DIAGNOSIS — Z9842 Cataract extraction status, left eye: Secondary | ICD-10-CM | POA: Diagnosis not present

## 2018-06-21 DIAGNOSIS — G4700 Insomnia, unspecified: Secondary | ICD-10-CM | POA: Diagnosis present

## 2018-06-21 DIAGNOSIS — I10 Essential (primary) hypertension: Secondary | ICD-10-CM | POA: Diagnosis not present

## 2018-06-21 DIAGNOSIS — Z7902 Long term (current) use of antithrombotics/antiplatelets: Secondary | ICD-10-CM | POA: Diagnosis not present

## 2018-06-21 DIAGNOSIS — I442 Atrioventricular block, complete: Secondary | ICD-10-CM | POA: Diagnosis present

## 2018-06-21 DIAGNOSIS — Z951 Presence of aortocoronary bypass graft: Secondary | ICD-10-CM | POA: Diagnosis not present

## 2018-06-21 DIAGNOSIS — E785 Hyperlipidemia, unspecified: Secondary | ICD-10-CM | POA: Diagnosis present

## 2018-06-21 HISTORY — PX: LEFT HEART CATH AND CORONARY ANGIOGRAPHY: CATH118249

## 2018-06-21 LAB — CBC WITH DIFFERENTIAL/PLATELET
Abs Immature Granulocytes: 0.02 10*3/uL (ref 0.00–0.07)
Basophils Absolute: 0.1 10*3/uL (ref 0.0–0.1)
Basophils Relative: 1 %
Eosinophils Absolute: 0.5 10*3/uL (ref 0.0–0.5)
Eosinophils Relative: 7 %
HCT: 30.5 % — ABNORMAL LOW (ref 39.0–52.0)
Hemoglobin: 10.1 g/dL — ABNORMAL LOW (ref 13.0–17.0)
Immature Granulocytes: 0 %
Lymphocytes Relative: 39 %
Lymphs Abs: 2.5 10*3/uL (ref 0.7–4.0)
MCH: 29.9 pg (ref 26.0–34.0)
MCHC: 33.1 g/dL (ref 30.0–36.0)
MCV: 90.2 fL (ref 80.0–100.0)
Monocytes Absolute: 0.5 10*3/uL (ref 0.1–1.0)
Monocytes Relative: 8 %
NEUTROS ABS: 2.8 10*3/uL (ref 1.7–7.7)
Neutrophils Relative %: 45 %
Platelets: 190 10*3/uL (ref 150–400)
RBC: 3.38 MIL/uL — ABNORMAL LOW (ref 4.22–5.81)
RDW: 13.2 % (ref 11.5–15.5)
WBC: 6.4 10*3/uL (ref 4.0–10.5)
nRBC: 0 % (ref 0.0–0.2)

## 2018-06-21 LAB — BASIC METABOLIC PANEL
Anion gap: 7 (ref 5–15)
BUN: 26 mg/dL — AB (ref 8–23)
CO2: 24 mmol/L (ref 22–32)
Calcium: 8.3 mg/dL — ABNORMAL LOW (ref 8.9–10.3)
Chloride: 106 mmol/L (ref 98–111)
Creatinine, Ser: 2.04 mg/dL — ABNORMAL HIGH (ref 0.61–1.24)
GFR calc Af Amer: 35 mL/min — ABNORMAL LOW (ref >60)
GFR calc non Af Amer: 30 mL/min — ABNORMAL LOW (ref >60)
Glucose, Bld: 112 mg/dL — ABNORMAL HIGH (ref 70–99)
Potassium: 3.9 mmol/L (ref 3.5–5.1)
Sodium: 137 mmol/L (ref 135–145)

## 2018-06-21 LAB — FOLATE RBC
Folate, Hemolysate: 505 ng/mL
Folate, RBC: 1712 ng/mL (ref >498)
Hematocrit: 29.5 % — ABNORMAL LOW (ref 37.5–51.0)

## 2018-06-21 SURGERY — LEFT HEART CATH AND CORONARY ANGIOGRAPHY
Anesthesia: LOCAL

## 2018-06-21 MED ORDER — LIDOCAINE HCL (PF) 1 % IJ SOLN
INTRAMUSCULAR | Status: DC | PRN
  Administered 2018-06-21: 2 mL

## 2018-06-21 MED ORDER — HEPARIN SODIUM (PORCINE) 1000 UNIT/ML IJ SOLN
INTRAMUSCULAR | Status: DC | PRN
  Administered 2018-06-21: 4000 [IU] via INTRAVENOUS

## 2018-06-21 MED ORDER — FENTANYL CITRATE (PF) 100 MCG/2ML IJ SOLN
INTRAMUSCULAR | Status: DC | PRN
  Administered 2018-06-21: 25 ug via INTRAVENOUS

## 2018-06-21 MED ORDER — MIDAZOLAM HCL 2 MG/2ML IJ SOLN
INTRAMUSCULAR | Status: DC | PRN
  Administered 2018-06-21: 2 mg via INTRAVENOUS

## 2018-06-21 MED ORDER — IOHEXOL 350 MG/ML SOLN
INTRAVENOUS | Status: DC | PRN
  Administered 2018-06-21: 35 mL via INTRAVENOUS

## 2018-06-21 MED ORDER — SODIUM CHLORIDE 0.9 % IV SOLN: INTRAVENOUS | Status: AC

## 2018-06-21 MED ORDER — HEPARIN (PORCINE) IN NACL 1000-0.9 UT/500ML-% IV SOLN
INTRAVENOUS | Status: AC
  Filled 2018-06-21: qty 500

## 2018-06-21 MED ORDER — ONDANSETRON HCL 4 MG/2ML IJ SOLN: 4.0000 mg | Freq: Four times a day (QID) | INTRAMUSCULAR | Status: DC | PRN

## 2018-06-21 MED ORDER — MIDAZOLAM HCL 2 MG/2ML IJ SOLN
INTRAMUSCULAR | Status: AC
  Filled 2018-06-21: qty 2

## 2018-06-21 MED ORDER — LIDOCAINE HCL (PF) 1 % IJ SOLN
INTRAMUSCULAR | Status: AC
  Filled 2018-06-21: qty 30

## 2018-06-21 MED ORDER — VERAPAMIL HCL 2.5 MG/ML IV SOLN
INTRAVENOUS | Status: AC
  Filled 2018-06-21: qty 2

## 2018-06-21 MED ORDER — ACETAMINOPHEN 325 MG PO TABS: 650.0000 mg | ORAL_TABLET | ORAL | Status: DC | PRN

## 2018-06-21 MED ORDER — HEPARIN SODIUM (PORCINE) 1000 UNIT/ML IJ SOLN
INTRAMUSCULAR | Status: AC
  Filled 2018-06-21: qty 1

## 2018-06-21 MED ORDER — PROPRANOLOL HCL 40 MG PO TABS
40.0000 mg | ORAL_TABLET | Freq: Two times a day (BID) | ORAL | Status: DC
  Administered 2018-06-21 – 2018-06-25 (×10): 40 mg via ORAL
  Filled 2018-06-21 (×11): qty 1

## 2018-06-21 MED ORDER — FENTANYL CITRATE (PF) 100 MCG/2ML IJ SOLN
INTRAMUSCULAR | Status: AC
  Filled 2018-06-21: qty 2

## 2018-06-21 MED ORDER — HEPARIN (PORCINE) IN NACL 1000-0.9 UT/500ML-% IV SOLN
INTRAVENOUS | Status: DC | PRN
  Administered 2018-06-21 (×2): 500 mL

## 2018-06-21 MED ORDER — SODIUM CHLORIDE 0.9 % IV SOLN
250.0000 mL | INTRAVENOUS | Status: DC | PRN
  Administered 2018-06-27: 12:00:00 via INTRAVENOUS

## 2018-06-21 MED ORDER — VERAPAMIL HCL 2.5 MG/ML IV SOLN
INTRAVENOUS | Status: DC | PRN
  Administered 2018-06-21: 10 mL via INTRA_ARTERIAL

## 2018-06-21 MED ORDER — SODIUM CHLORIDE 0.9% FLUSH
3.0000 mL | INTRAVENOUS | Status: DC | PRN
  Administered 2018-06-25 – 2018-06-26 (×2): 3 mL via INTRAVENOUS
  Filled 2018-06-21 (×2): qty 3

## 2018-06-21 MED ORDER — SODIUM CHLORIDE 0.9% FLUSH
3.0000 mL | Freq: Two times a day (BID) | INTRAVENOUS | Status: DC
  Administered 2018-06-21 – 2018-06-26 (×11): 3 mL via INTRAVENOUS

## 2018-06-21 SURGICAL SUPPLY — 10 items
CATH INFINITI 5 FR JL3.5 (CATHETERS) ×2 IMPLANT
CATH INFINITI JR4 5F (CATHETERS) ×2 IMPLANT
DEVICE RAD COMP TR BAND LRG (VASCULAR PRODUCTS) ×2 IMPLANT
GLIDESHEATH SLEND SS 6F .021 (SHEATH) ×2 IMPLANT
GUIDEWIRE INQWIRE 1.5J.035X260 (WIRE) ×1 IMPLANT
INQWIRE 1.5J .035X260CM (WIRE) ×2
KIT HEART LEFT (KITS) ×2 IMPLANT
PACK CARDIAC CATHETERIZATION (CUSTOM PROCEDURE TRAY) ×2 IMPLANT
TRANSDUCER W/STOPCOCK (MISCELLANEOUS) ×2 IMPLANT
TUBING CIL FLEX 10 FLL-RA (TUBING) ×2 IMPLANT

## 2018-06-21 NOTE — Progress Notes (Signed)
Progress Note    Kevin [name]  DTO:[id number] DOB: 08-17-1940  DOA: 06/19/2018 PCP: Marin Olp, MD    Brief Narrative:     Medical records reviewed and are as summarized below:  Kevin [name] is an 78 y.o. 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 It occurs most days, is lower L-sided, described as an "ache." He says it occurs no more than once a day. It started about 3 weeks ago, pt does not believe it is becoming more frequent but wife states she sees him holding his chest more often the last 2-3 days.  S/p cath and plan is for CABG.  Assessment/Plan:   Principal Problem:   Chest pain Active Problems:   Hyperlipidemia   UNSPECIFIED ANEMIA   Essential hypertension   BPH (benign prostatic hyperplasia)   Chronic pain syndrome   History of CVA (cerebrovascular accident)   Carotid artery stenosis s/p L carotid endarterectomy   CKD (chronic kidney disease), stage III (HCC)   Diastolic CHF (HCC)   PAD (peripheral artery disease) (HCC)   Unstable angina (HCC)  CAD -Echo revealed: EF of 60 to 65% with grade 1 diastolic dysfunction and high ventricular filling pressure.  There was mildly dilated left atrium and mild concentric hypertrophy of the left ventricle. -s/p cath -plan for CABG next Wednesday (per patient) to allow for plavix washout  Chronic kidney disease stage IV: Patient followed in outpatient setting by Dr. Vanetta Mulders of nephrology.  Currently creatinine noted to be 2 which appears patient's baseline of 1.9-2.1. -monitor post cath  Normocytic anemia:  -? CKD  Chronic pain:  -oxycodone at home.  Dyslipidemia:  -Lipid panel revealed total cholesterol 113, HDL 23, LDL 55, and triglycerides 177.  Patient's LDL appears less than 70, but HDL and triglycerides do not appear at  target. -Continue Lipitor and fenofibrate  Essential hypertension -Continue amlodipine  Tremor -Propranolol Restart  BPH -Continue Flomax  Prediabetes -Hemoglobin A1c 6   Family Communication/Anticipated D/C date and plan/Code Status   DVT prophylaxis: Lovenox ordered. Code Status: Full Code.  Family Communication: wife at bedside Disposition Plan:    Medical Consultants:    Cards  CVTS    Subjective:   Wants to make sure he has his pain medications ordered  Objective:    Vitals:   06/21/18 0911 06/21/18 1009 06/21/18 1105 06/21/18 1120  BP: 132/77 (!) 139/53 (!) 151/61 (!) 140/52  Pulse: 60     Resp: 15 12  16   Temp: 98.7 F (37.1 C)     TempSrc: Oral     SpO2:      Weight: 78.6 kg     Height: 5\' 7"  (1.702 m)       Intake/Output Summary (Last 24 hours) at 06/21/2018 1331 Last data filed at 06/21/2018 1200 Gross per 24 hour  Intake 1405.91 ml  Output 375 ml  Net 1030.91 ml   Filed Weights   06/19/18 1043 06/20/18 1200 06/21/18 0911  Weight: 81.6 kg 78.4 kg 78.6 kg    Exam: In bed, NAD rrr No rashes or lesions Appears younger than stated age Mild tremor  Data Reviewed:   I have personally reviewed following labs and imaging studies:  Labs: Labs show the following:   Basic Metabolic Panel: Recent Labs  Lab 06/19/18 1134 06/20/18 0732 06/21/18  0258  NA 138 137 137  K 3.7 3.7 3.9  CL 105 106 106  CO2 21* 23 24  GLUCOSE 156* 113* 112*  BUN 26* 25* 26*  CREATININE 2.09* 2.05* 2.04*  CALCIUM 8.7* 8.6* 8.3*   GFR Estimated Creatinine Clearance: 27.5 mL/min (A) (by C-G formula based on SCr of 2.04 mg/dL (H)). Liver Function Tests: No results for input(s): AST, ALT, ALKPHOS, BILITOT, PROT, ALBUMIN in the last 168 hours. No results for input(s): LIPASE, AMYLASE in the last 168 hours. No results for input(s): AMMONIA in the last 168 hours. Coagulation profile No results for input(s): INR, PROTIME in the last 168  hours.  CBC: Recent Labs  Lab 06/19/18 1134 06/20/18 0732 06/21/18 0258  WBC 6.3 5.8 6.4  NEUTROABS 3.0 2.7 2.8  HGB 10.8* 10.7* 10.1*  HCT 33.7* 32.5* 30.5*  MCV 92.6 90.0 90.2  PLT 214 200 190   Cardiac Enzymes: Recent Labs  Lab 06/19/18 1134 06/19/18 1524 06/19/18 1853  TROPONINI <0.03 <0.03 <0.03   BNP (last 3 results) No results for input(s): PROBNP in the last 8760 hours. CBG: No results for input(s): GLUCAP in the last 168 hours. D-Dimer: No results for input(s): DDIMER in the last 72 hours. Hgb A1c: Recent Labs    06/20/18 0519  HGBA1C 6.0*   Lipid Profile: Recent Labs    06/20/18 0519  CHOL 113  HDL 23*  LDLCALC 55  TRIG 177*  CHOLHDL 4.9   Thyroid function studies: No results for input(s): TSH, T4TOTAL, T3FREE, THYROIDAB in the last 72 hours.  Invalid input(s): FREET3 Anemia work up: Recent Labs    06/20/18 [place] 488   Sepsis Labs: Recent Labs  Lab 06/19/18 1134 06/20/18 0732 06/21/18 0258  WBC 6.3 5.8 6.4    Microbiology No results found for this or any previous visit (from the past 240 hour(s)).  Procedures and diagnostic studies:  No results found.  Medications:   . amLODipine  10 mg Oral Daily  . [START ON 06/22/2018] aspirin EC  81 mg Oral Daily  . atorvastatin  20 mg Oral Daily  . cholecalciferol  1,000 Units Oral Daily  . diazepam  2.5-5 mg Oral QHS  . enoxaparin (LOVENOX) injection  30 mg Subcutaneous Q24H  . fenofibrate  160 mg Oral Daily  . furosemide  20 mg Oral Q M,W,F  . gabapentin  300 mg Oral BID  . Melatonin  9 mg Oral QHS  . sodium chloride flush  3 mL Intravenous Q12H  . tamsulosin  0.4 mg Oral QHS  . tiZANidine  4 mg Oral QHS  . vitamin B-12  1,000 mcg Oral Daily   Continuous Infusions: . sodium chloride       LOS: 0 days   [name]  [facility]   *Please refer to [email or web address], password [email or web address] to get updated schedule on who will round on this patient, as hospitalists switch  teams weekly. If 7PM-7AM, please contact night-coverage at [email or web address], password [email or web address] for any overnight needs.  06/21/2018, 1:31 PM

## 2018-06-21 NOTE — Progress Notes (Signed)
[facility] called for CABG evaluation.

## 2018-06-21 NOTE — Interval H&P Note (Signed)
Cath Lab Visit (complete for each Cath Lab visit)  Clinical Evaluation Leading to the Procedure:   ACS: Yes.    Non-ACS:    Anginal Classification: CCS IV  Anti-ischemic medical therapy: Minimal Therapy (1 class of medications)  Non-Invasive Test Results: No non-invasive testing performed  Prior CABG: No previous CABG      History and Physical Interval Note:  06/21/2018 7:35 AM  Kevin [name]  has presented today for surgery, with the diagnosis of cp  The various methods of treatment have been discussed with the patient and family. After consideration of risks, benefits and other options for treatment, the patient has consented to  Procedure(s): LEFT HEART CATH AND CORONARY ANGIOGRAPHY (N/A) as a surgical intervention .  The patient's history has been reviewed, patient examined, no change in status, stable for surgery.  I have reviewed the patient's chart and labs.  Questions were answered to the patient's satisfaction.     Larae Grooms

## 2018-06-21 NOTE — Consult Note (Signed)
Reason for Consult:3 vessel CAD with atypical angina Referring Physician: Dr. Harrington Challenger Irish Lack   GIOVANNI BIBY is an 78 y.o. male.  HPI: Mr. Rumble is a 78 yo man who presents with a cc/o chest pressure  Donterrius Santucci is a 79 yo man with a past history of hypertension, hyperlipidemia, PAD, ECCOD s/p endarterectomy, TIA, ruptured AAA, stage IV CKD and rheumatoid arthritis. He has been substernal/ epigastric pain for the past couple of weeks. This is described as a pressure sensation which occurs at rest. He has not had any exertional symptoms. Last 30-45 minutes usually. Do not occur every day. He contacted his MD as the pains were becoming more frequent. He was referred to the ED.   In the ED- ECG showed no acute ischemic changes and troponins were negative. He has had a couple of episodes of pain since admission, which were rapidly relieved with SL NTG.  Past Medical History:  Diagnosis Date  . Arthritis    DDD- Lower Back  . Bradycardia   . CAP (community acquired pneumonia) 03/17/2015   "THAT'S WHAT THEY ARE THINKING; THEY ARE NOT SURE"  . Carotid artery occlusion   . Chest pain 06/19/2018  . Chronic lower back pain    DDD  . CKD (chronic kidney disease)    Dr. Corliss Parish  . Diverticulosis   . Enlarged prostate    takes Flomax daily  . History of blood transfusion    "probably when they did aortic aneurysm"  . History of colon polyps    benign  . Hyperlipidemia   . Hypertension    takes Amlodipine and Zebeta daily  . Insomnia    takes Melatonin nightly  . Joint pain   . 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     . Muscle spasm    takes Zanaflex daily as needed  . Peripheral edema    takes Furosemide daily  . Pneumonia    hx of   . Rheumatic fever 1946  . Rheumatoid arthritis ([place])   . Short-term memory loss    minimal  . Shortness of breath dyspnea    with exertion but lasts very short period of time  . TIA (transient ischemic attack)    x  2;takes Plavix daily  . Urinary frequency    takes Flomax daily  . Urinary urgency     Past Surgical History:  Procedure Laterality Date  . ABDOMINAL AORTIC ANEURYSM REPAIR  2010  . CAROTID ENDARTERECTOMY Left 09/20/2004  . CATARACT EXTRACTION, BILATERAL     2016-2017  . COLONOSCOPY    . ENDARTERECTOMY Right 10/12/2015   Procedure: RIGHT CAROTID ENDARTERECTOMY WITH PATCH ANGIOPLASTY;  Surgeon: Elam Dutch, MD;  Location: [facility];  Service: Vascular;  Laterality: Right;  . INGUINAL HERNIA REPAIR Left 02/05/2013   Procedure: HERNIA REPAIR INGUINAL ADULT;  Surgeon: Joyice Faster. Cornett, MD;  Location: [facility];  Service: General;  Laterality: Left;  . INGUINAL HERNIA REPAIR Left   . INSERTION OF MESH Left 02/05/2013   Procedure: INSERTION OF MESH;  Surgeon: Joyice Faster. Cornett, MD;  Location: [facility];  Service: General;  Laterality: Left;  . LEFT HEART CATH AND CORONARY ANGIOGRAPHY N/A 06/21/2018   Procedure: LEFT HEART CATH AND CORONARY ANGIOGRAPHY;  Surgeon: Jettie Booze, MD;  Location: [facility];  Service: Cardiovascular;  Laterality: N/A;  . SHOULDER ARTHROSCOPY WITH ROTATOR CUFF REPAIR AND SUBACROMIAL DECOMPRESSION Left 05/01/2014   Procedure: LEFT  ARTHROSCOPY SHOULDER SUBACROMIAL DECOMPRESSION,DISTAL CLAVICAL RESECTION AND ROTATOR CUFF REPAIR;  Surgeon: Marin Shutter, MD;  Location: [facility];  Service: Orthopedics;  Laterality: Left;  . TESTICLAR CYST EXCISION Left   . TONSILLECTOMY    . VASECTOMY      Family History  Problem Relation Age of Onset  . Parkinsonism Father   . Dementia Father   . Cancer Father        Throat  . Colon cancer Mother        died in 40s  . Cancer Mother        Colon    Social History:  reports that he quit smoking about 31 years ago. His smoking use included cigarettes. He has a 56.00 pack-year smoking history. He has never used smokeless tobacco. He reports current alcohol use. He reports that he does not  use drugs.  Allergies: No Known Allergies  Medications:  Prior to Admission:  Medications Prior to Admission  Medication Sig Dispense Refill Last Dose  . albuterol (VENTOLIN HFA) 108 (90 Base) MCG/ACT inhaler Inhale 2 puffs into the lungs every 6 (six) hours as needed for wheezing or shortness of breath.   05/08/2018 at prn  . amLODipine (NORVASC) 10 MG tablet TAKE 1 TABLET EVERY DAY (Patient taking differently: Take 10 mg by mouth daily. ) 90 tablet 1 06/19/2018 at Unknown time  . atorvastatin (LIPITOR) 20 MG tablet TAKE 1 TABLET EVERY DAY (Patient taking differently: Take 20 mg by mouth daily. ) 90 tablet 1 06/19/2018 at Unknown time  . BIOTIN PO Take 1 tablet by mouth daily.   06/19/2018 at Unknown time  . Cholecalciferol (VITAMIN D-3) 1000 UNITS CAPS Take 1,000 Units by mouth daily.    06/19/2018 at Unknown time  . clopidogrel (PLAVIX) 75 MG tablet TAKE 1 TABLET EVERY DAY (Patient taking differently: Take 75 mg by mouth daily. ) 90 tablet 1 06/19/2018 at Unknown time  . diazepam (VALIUM) 5 MG tablet TAKE 1/2 TO 1 TABLET EVERY 8 (EIGHT) HOURS AS NEEDED FOR MUSCLE SPASMS OR SEDATION. (Patient taking differently: Take 2.5-5 mg by mouth at bedtime. ) 90 tablet 0 06/18/2018 at Unknown time  . fenofibrate 160 MG tablet TAKE 1 TABLET EVERY DAY (Patient taking differently: Take 160 mg by mouth daily. ) 90 tablet 3 06/19/2018 at Unknown time  . furosemide (LASIX) 20 MG tablet TAKE 1 TABLET EVERY OTHER DAY (Patient taking differently: Take 20 mg by mouth every other day. ) 45 tablet 1 06/19/2018 at Unknown time  . gabapentin (NEURONTIN) 300 MG capsule TAKE 1 CAPSULE TWICE DAILY (Patient taking differently: Take 300 mg by mouth 2 (two) times daily. ) 180 capsule 1 06/19/2018 at Unknown time  . Melatonin 10 MG TABS Take 10 mg by mouth at bedtime.    06/18/2018 at Unknown time  . oxyCODONE-acetaminophen (PERCOCET) 7.5-325 MG tablet Take 1 tablet by mouth 2 (two) times daily as needed for moderate pain. May take an  extra tablet when pain is severe, 10 days only (Patient taking differently: Take 1 tablet by mouth 2 (two) times daily. May take an extra tablet when pain is severe, 10 days only) 70 tablet 0 06/19/2018 at Unknown time  . propranolol (INDERAL) 40 MG tablet TAKE 1 TABLET TWICE DAILY (Patient taking differently: Take 40 mg by mouth 2 (two) times daily. ) 180 tablet 3 06/19/2018 at Unknown time  . tamsulosin (FLOMAX) 0.4 MG CAPS capsule TAKE 1 CAPSULE EVERY DAY (Patient taking differently: Take 0.4 mg by  mouth at bedtime. ) 90 capsule 3 06/18/2018 at Unknown time  . tiZANidine (ZANAFLEX) 4 MG tablet TAKE 1 TABLET EVERY 6 HOURS AS NEEDED FOR MUSCLE SPASM(S) (Patient taking differently: Take 4 mg by mouth at bedtime. ) 90 tablet 1 06/18/2018 at Unknown time  . vitamin B-12 (CYANOCOBALAMIN) 1000 MCG tablet Take 1,000 mcg by mouth daily.   06/19/2018 at Unknown time  . triamcinolone cream (KENALOG) 0.1 % APPLY 1 APPLICATION 2 TIMES DAILY TOPICALLY FOR 7-10 DAYS (Patient taking differently: Apply 1 application topically 2 (two) times daily. ) 80 g 0 unknown    Results for orders placed or performed during the hospital encounter of 06/19/18 (from the past 48 hour(s))  Troponin I - Now Then Q6H     Status: None   Collection Time: 06/19/18  3:24 PM  Result Value Ref Range   Troponin I <0.03 <0.03 ng/mL    Comment: Performed at [facility], [place] [address] [place], [place] [address]  Troponin I - Now Then Q6H     Status: None   Collection Time: 06/19/18  6:53 PM  Result Value Ref Range   Troponin I <0.03 <0.03 ng/mL    Comment: Performed at [facility] [address], [place], [place] [address]  Hemoglobin A1c     Status: Abnormal   Collection Time: 06/20/18  5:19 AM  Result Value Ref Range   Hgb A1c MFr Bld 6.0 (H) 4.8 - 5.6 %    Comment: (NOTE) Pre diabetes:          5.7%-6.4% Diabetes:              >6.4% Glycemic control for   <7.0% adults with diabetes    Mean Plasma Glucose 125.5  mg/dL    Comment: Performed at [facility] [address] [place], [place] [address]  Lipid panel     Status: Abnormal   Collection Time: 06/20/18  5:19 AM  Result Value Ref Range   Cholesterol 113 0 - 200 mg/dL   Triglycerides 177 (H) <150 mg/dL   HDL 23 (L) >40 mg/dL   Total CHOL/HDL Ratio 4.9 RATIO   VLDL 35 0 - 40 mg/dL   LDL Cholesterol 55 0 - 99 mg/dL    Comment:        Total Cholesterol/HDL:CHD Risk Coronary Heart Disease Risk Table                     Men   Women  1/2 Average Risk   3.4   3.3  Average Risk       5.0   4.4  2 X Average Risk   9.6   7.1  3 X Average Risk  23.4   11.0        Use the calculated Patient Ratio above and the CHD Risk Table to determine the patient's CHD Risk.        ATP III CLASSIFICATION (LDL):  <100     mg/dL   Optimal  100-129  mg/dL   Near or Above                    Optimal  130-159  mg/dL   Borderline  160-189  mg/dL   High  >190     mg/dL   Very High Performed at [facility] [address], [place], [place] [address]   Folate RBC     Status: Abnormal  Collection Time: 06/20/18  5:19 AM  Result Value Ref Range   Folate, Hemolysate 505.0 Not Estab. ng/mL   Hematocrit 29.5 (L) 37.5 - 51.0 %   Folate, RBC 1,712 >498 ng/mL    Comment: (NOTE) Performed At: [facility] [place], [place] [id number] Rush Farmer MD [id number]   Vitamin B12     Status: None   Collection Time: 06/20/18  5:19 AM  Result Value Ref Range   Vitamin B-12 488 180 - 914 pg/mL    Comment: (NOTE) This assay is not validated for testing neonatal or myeloproliferative syndrome specimens for Vitamin B12 levels. Performed at [facility], [place] [address] [place], [place] [address]   CBC with Differential/Platelet     Status: Abnormal   Collection Time: 06/20/18  7:32 AM  Result Value Ref Range   WBC 5.8 4.0 - 10.5 K/uL   RBC 3.61 (L) 4.22 - 5.81 MIL/uL   Hemoglobin 10.7 (L) 13.0 - 17.0 g/dL   HCT 32.5 (L)  39.0 - 52.0 %   MCV 90.0 80.0 - 100.0 fL   MCH 29.6 26.0 - 34.0 pg   MCHC 32.9 30.0 - 36.0 g/dL   RDW 13.2 11.5 - 15.5 %   Platelets 200 150 - 400 K/uL   nRBC 0.0 0.0 - 0.2 %   Neutrophils Relative % 48 %   Neutro Abs 2.7 1.7 - 7.7 K/uL   Lymphocytes Relative 34 %   Lymphs Abs 2.0 0.7 - 4.0 K/uL   Monocytes Relative 8 %   Monocytes Absolute 0.5 0.1 - 1.0 K/uL   Eosinophils Relative 9 %   Eosinophils Absolute 0.5 0.0 - 0.5 K/uL   Basophils Relative 1 %   Basophils Absolute 0.1 0.0 - 0.1 K/uL   Immature Granulocytes 0 %   Abs Immature Granulocytes 0.01 0.00 - 0.07 K/uL    Comment: Performed at [facility] [address] [place], [place] [address]  Basic metabolic panel     Status: Abnormal   Collection Time: 06/20/18  7:32 AM  Result Value Ref Range   Sodium 137 135 - 145 mmol/L   Potassium 3.7 3.5 - 5.1 mmol/L   Chloride 106 98 - 111 mmol/L   CO2 23 22 - 32 mmol/L   Glucose, Bld 113 (H) 70 - 99 mg/dL   BUN 25 (H) 8 - 23 mg/dL   Creatinine, Ser 2.05 (H) 0.61 - 1.24 mg/dL   Calcium 8.6 (L) 8.9 - 10.3 mg/dL   GFR calc non Af Amer 30 (L) >60 mL/min   GFR calc Af Amer 35 (L) >60 mL/min   Anion gap 8 5 - 15    Comment: Performed at [facility] [address] [place] [address]  CBC with Differential/Platelet     Status: Abnormal   Collection Time: 06/21/18  2:58 AM  Result Value Ref Range   WBC 6.4 4.0 - 10.5 K/uL   RBC 3.38 (L) 4.22 - 5.81 MIL/uL   Hemoglobin 10.1 (L) 13.0 - 17.0 g/dL   HCT 30.5 (L) 39.0 - 52.0 %   MCV 90.2 80.0 - 100.0 fL   MCH 29.9 26.0 - 34.0 pg   MCHC 33.1 30.0 - 36.0 g/dL   RDW 13.2 11.5 - 15.5 %   Platelets 190 150 - 400 K/uL   nRBC 0.0 0.0 - 0.2 %   Neutrophils Relative % 45 %   Neutro Abs 2.8 1.7 - 7.7 K/uL  Lymphocytes Relative 39 %   Lymphs Abs 2.5 0.7 - 4.0 K/uL   Monocytes Relative 8 %   Monocytes Absolute 0.5 0.1 - 1.0 K/uL   Eosinophils Relative 7 %   Eosinophils Absolute 0.5 0.0 - 0.5 K/uL   Basophils  Relative 1 %   Basophils Absolute 0.1 0.0 - 0.1 K/uL   Immature Granulocytes 0 %   Abs Immature Granulocytes 0.02 0.00 - 0.07 K/uL    Comment: Performed at [facility] [address], [place], [place] [address]  Basic metabolic panel     Status: Abnormal   Collection Time: 06/21/18  2:58 AM  Result Value Ref Range   Sodium 137 135 - 145 mmol/L   Potassium 3.9 3.5 - 5.1 mmol/L   Chloride 106 98 - 111 mmol/L   CO2 24 22 - 32 mmol/L   Glucose, Bld 112 (H) 70 - 99 mg/dL   BUN 26 (H) 8 - 23 mg/dL   Creatinine, Ser 2.04 (H) 0.61 - 1.24 mg/dL   Calcium 8.3 (L) 8.9 - 10.3 mg/dL   GFR calc non Af Amer 30 (L) >60 mL/min   GFR calc Af Amer 35 (L) >60 mL/min   Anion gap 7 5 - 15    Comment: Performed at [facility] [address], [place], [place] [address]    No results found.  Review of Systems  Constitutional: Positive for malaise/fatigue.  Eyes: Negative for blurred vision and double vision.  Respiratory: Positive for cough. Negative for wheezing.   Cardiovascular: Positive for chest pain. Negative for palpitations and orthopnea.  Gastrointestinal: Negative for nausea and vomiting.  Genitourinary: Positive for frequency. Negative for dysuria.  Musculoskeletal: Positive for back pain and joint pain.       Leg cramps  Neurological: Positive for dizziness. Negative for focal weakness, seizures and loss of consciousness.  Endo/Heme/Allergies: Bruises/bleeds easily.  All other systems reviewed and are negative.  Blood pressure (!) 140/52, pulse 60, temperature 98.7 F (37.1 C), temperature source Oral, resp. rate 16, height 5\' 7"  (1.702 m), weight 78.6 kg, SpO2 100 %. Physical Exam  Constitutional: He is oriented to person, place, and time. He appears well-developed and well-nourished. No distress.  Cardiovascular: Normal rate, regular rhythm and normal heart sounds. Exam reveals no gallop and no friction rub.  No murmur heard. Pulses:      Femoral pulses are 2+ on the  right side and 2+ on the left side.      Dorsalis pedis pulses are 0 on the right side and 0 on the left side.       Posterior tibial pulses are 0 on the right side and 0 on the left side.  Respiratory: Effort normal and breath sounds normal. No respiratory distress. He has no wheezes. He has no rales.  GI: Soft. He exhibits no distension. There is no abdominal tenderness.  Well healed midline scar  Musculoskeletal:        General: No deformity or edema.  Neurological: He is alert and oriented to person, place, and time. No cranial nerve deficit. He exhibits normal muscle tone. Coordination normal.  Skin: Skin is warm and dry.  ECHOCARDIOGRAM Study Conclusions  - Left ventricle: The cavity size was normal. There was mild   concentric hypertrophy. Systolic function was normal. The   estimated ejection fraction was in the range of 60% to 65%. Wall   motion was normal; there were no regional wall motion   abnormalities. Doppler parameters  are consistent with abnormal   left ventricular relaxation (grade 1 diastolic dysfunction).   Doppler parameters are consistent with high ventricular filling   pressure. - Aortic valve: Transvalvular velocity was within the normal range.   There was no stenosis. There was no regurgitation. Valve area   (VTI): 2.17 cm^2. Valve area (Vmean): 2.45 cm^2. - Mitral valve: Mildly calcified annulus. Transvalvular velocity   was within the normal range. There was no evidence for stenosis.   There was no regurgitation. - Left atrium: The atrium was mildly dilated. - Right ventricle: The cavity size was normal. Wall thickness was   normal. Systolic function was normal. - Tricuspid valve: There was trivial regurgitation. - Pulmonary arteries: Systolic pressure was within the normal   range. PA peak pressure: 24 mm Hg (S).  CARDIAC CATHETERIZATION Conclusion     Prox RCA lesion is 100% stenosed. Left to right collaterals.  2nd Mrg lesion is 80%  stenosed.  Ost 1st Diag to 1st Diag lesion is 90% stenosed.  Prox LAD lesion is 80% stenosed.  LV end diastolic pressure is normal.  There is no aortic valve stenosis.   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  Severe disease in a large diagonal.  Eccentric lesion in an OM, best seen in the AP caudal view.  Chronically occluded dominant RCA.   Hold Plavix.  Plan for cardiac surgery consult.    I personally reviewed the cath images and concur with the findings noted above  Assessment/Plan: 78 yo man with multiple CRF and a history of atherosclerotic cardiovascular disease presents with atypical chest pain. Ruled out for acute MI but has severe 3 vessel CAD by cath. CABG is indicated for survival benefit. Hopefully, will have relief of symptoms as well as I suspect this is angina, but given atypical symptoms it is not certain.  He has an extensive history of ASCVD with a right carotid endarterectomy and repair of a ruptured AAA in the past. He also has stage IV CKD. Both of the issues increase his perioperative risk, but are not prohibitive.  I discussed the general nature of the procedure, the need for general anesthesia, the use of cardiopulmonary bypass, and the incisions to be used with Mr and Mrs Morado. We discussed the expected hospital stay, overall recovery and short and long term outcomes. I informed them of the indications, risks, benefits and alternatives. They understand the risks include, but are not limited to death, stroke, MI, DVT/PE, bleeding, possible need for transfusion, infections, cardiac arrhythmias, as well as other organ system dysfunction including respiratory, renal, or GI complications. Increased risk for neurologic and renal complications.  He accepts the risks and agrees to proceed.  He has been on Plavix and we need for that to wash out of his system. Will tentatively plan for surgery next Wednesday.  If he were to become unstable  may have to proceed more urgently.  Melrose Nakayama 06/21/2018, 1:59 PM

## 2018-06-22 ENCOUNTER — Ambulatory Visit (HOSPITAL_COMMUNITY): Payer: Medicare HMO

## 2018-06-22 DIAGNOSIS — R0789 Other chest pain: Secondary | ICD-10-CM

## 2018-06-22 DIAGNOSIS — Z01810 Encounter for preprocedural cardiovascular examination: Secondary | ICD-10-CM

## 2018-06-22 DIAGNOSIS — N400 Enlarged prostate without lower urinary tract symptoms: Secondary | ICD-10-CM

## 2018-06-22 LAB — BASIC METABOLIC PANEL
Anion gap: 6 (ref 5–15)
BUN: 20 mg/dL (ref 8–23)
CO2: 24 mmol/L (ref 22–32)
CREATININE: 2 mg/dL — AB (ref 0.61–1.24)
Calcium: 8.6 mg/dL — ABNORMAL LOW (ref 8.9–10.3)
Chloride: 108 mmol/L (ref 98–111)
GFR calc Af Amer: 36 mL/min — ABNORMAL LOW (ref >60)
GFR calc non Af Amer: 31 mL/min — ABNORMAL LOW (ref >60)
Glucose, Bld: 107 mg/dL — ABNORMAL HIGH (ref 70–99)
Potassium: 3.9 mmol/L (ref 3.5–5.1)
Sodium: 138 mmol/L (ref 135–145)

## 2018-06-22 LAB — IRON AND TIBC
Iron: 100 ug/dL (ref 45–182)
Saturation Ratios: 27 % (ref 17.9–39.5)
TIBC: 368 ug/dL (ref 250–450)
UIBC: 268 ug/dL

## 2018-06-22 LAB — CBC
HCT: 30 % — ABNORMAL LOW (ref 39.0–52.0)
Hemoglobin: 10.3 g/dL — ABNORMAL LOW (ref 13.0–17.0)
MCH: 31.1 pg (ref 26.0–34.0)
MCHC: 34.3 g/dL (ref 30.0–36.0)
MCV: 90.6 fL (ref 80.0–100.0)
PLATELETS: 187 10*3/uL (ref 150–400)
RBC: 3.31 MIL/uL — ABNORMAL LOW (ref 4.22–5.81)
RDW: 13.3 % (ref 11.5–15.5)
WBC: 5.5 10*3/uL (ref 4.0–10.5)
nRBC: 0 % (ref 0.0–0.2)

## 2018-06-22 LAB — SURGICAL PCR SCREEN
MRSA, PCR: NEGATIVE
Staphylococcus aureus: NEGATIVE

## 2018-06-22 LAB — FOLATE: Folate: 35 ng/mL (ref >5.9)

## 2018-06-22 LAB — RETICULOCYTES
Immature Retic Fract: 8.1 % (ref 2.3–15.9)
RBC.: 3.75 MIL/uL — ABNORMAL LOW (ref 4.22–5.81)
Retic Count, Absolute: 51.4 10*3/uL (ref 19.0–186.0)
Retic Ct Pct: 1.4 % (ref 0.4–3.1)

## 2018-06-22 LAB — VITAMIN B12: Vitamin B-12: 1048 pg/mL — ABNORMAL HIGH (ref 180–914)

## 2018-06-22 LAB — FERRITIN: FERRITIN: 319 ng/mL (ref 24–336)

## 2018-06-22 MED ORDER — MUPIROCIN 2 % EX OINT: 1.0000 "application " | TOPICAL_OINTMENT | Freq: Two times a day (BID) | CUTANEOUS | Status: DC

## 2018-06-22 NOTE — Progress Notes (Signed)
CARDIAC REHAB PHASE I   PRE:  Rate/Rhythm: 57 SB    BP: sitting 141/69    SaO2: 97 RA  MODE:  Ambulation: 470 ft   POST:  Rate/Rhythm: 78 SR    BP: sitting 165/71     SaO2: 98 RA  Pt eager to walk. Has spine and hips arthritis and he uses cane for long distances. He was able to walk hallway without rest. Sts his angina is an ache that he has more with rest than exercise. No angina noted walking today. Discussed sternal precautions, IS (2200 mL), mobility, and d/c planning with pt and wife. Very inquisitive and receptive. Wife will be with him at d/c (taking time off work). He is eager to continue to walk halls and I discuss with them reporting any angina to RN. We will f/u tomorrow. [place], ACSM 06/22/2018 10:43 AM

## 2018-06-22 NOTE — Progress Notes (Signed)
Progress Note    Kevin [name]  ZOX:[id number] DOB: 10/05/1940  DOA: 06/19/2018 PCP: Marin Olp, MD    Brief Narrative:     Medical records reviewed and are as summarized below:  Kevin [name] is an 78 y.o. 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 It occurs most days, is lower L-sided, described as an "ache." He says it occurs no more than once a day. It started about 3 weeks ago, pt does not believe it is becoming more frequent but wife states she sees him holding his chest more often the last 2-3 days.  S/p cath and plan is for CABG.  Assessment/Plan:   Unstable angina/multivessel CAD -Left heart catheterization noted severe multivessel CAD with 100% stenosis of proximal RCA, 80% second marginal, 90% for ostial first diagonal and 80% proximal LAD -Cardiology following, CT surgery consulted, recommended CABG after Plavix washout, planned for 1/29 -Continue aspirin, beta-blocker, statin -remains stable  Stage IV kidney disease -Followed by Dr. Moshe Cipro -Creatinine stable at baseline of 2 -No significant change post cath, monitor  Anemia of chronic disease -Due to chronic kidney disease, check iron panel -Hemoglobin slightly lower than baseline  Prediabetes -Hemoglobin A1c 6.0  Chronic pain:  -oxycodone at home.  Dyslipidemia:  -Lipid panel revealed total cholesterol 113, HDL 23, LDL 55, and triglycerides 177.  Patient's LDL appears less than 70, but HDL and triglycerides do not appear at target. -Continue Lipitor and fenofibrate  Essential hypertension -Continue amlodipine  Tremor -Propranolol Restarted  BPH -Continue Flomax  Prediabetes -Hemoglobin A1c 6   Family Communication/Anticipated D/C date and plan/Code Status   DVT prophylaxis: Lovenox ordered. Code Status: Full  Code.  Family Communication: Wife at bedside Disposition Plan: Home pending CABG   Medical Consultants:    Cards  CVTS    Subjective:    Objective:    Vitals:   06/21/18 1525 06/21/18 1957 06/22/18 0543 06/22/18 1040  BP: 135/60 (!) 138/49 (!) 118/99 (!) 165/71  Pulse: (!) 57 (!) 55 (!) 54 60  Resp: 13  20   Temp: 97.6 F (36.4 C) 98 F (36.7 C) (!) 97.4 F (36.3 C)   TempSrc:  Oral Oral   SpO2: 100% 97% 98%   Weight:   78.2 kg   Height:        Intake/Output Summary (Last 24 hours) at 06/22/2018 1405 Last data filed at 06/22/2018 1049 Gross per 24 hour  Intake 716.33 ml  Output -  Net 716.33 ml   Filed Weights   06/20/18 1200 06/21/18 0911 06/22/18 0543  Weight: 78.4 kg 78.6 kg 78.2 kg   Gen: Awake, Alert, Oriented X 3,  HEENT: PERRLA, Neck supple, no JVD Lungs: Good air movement bilaterally, CTAB CVS: RRR,No Gallops,Rubs or new Murmurs Abd: soft, Non tender, non distended, BS present Extremities: No edema Skin: no new rashes  Data Reviewed:   I have personally reviewed following labs and imaging studies:  Labs: Labs show the following:   Basic Metabolic Panel: Recent Labs  Lab 06/19/18 1134 06/20/18 0732 06/21/18 0258 06/22/18 0427  NA 138 137 137 138  K 3.7 3.7 3.9 3.9  CL 105 106 106 108  CO2 21* 23 24 24   GLUCOSE 156* 113* 112* 107*  BUN 26* 25* 26* 20  CREATININE 2.09* 2.05* 2.04* 2.00*  CALCIUM  8.7* 8.6* 8.3* 8.6*   GFR Estimated Creatinine Clearance: 28 mL/min (A) (by C-G formula based on SCr of 2 mg/dL (H)). Liver Function Tests: No results for input(s): AST, ALT, ALKPHOS, BILITOT, PROT, ALBUMIN in the last 168 hours. No results for input(s): LIPASE, AMYLASE in the last 168 hours. No results for input(s): AMMONIA in the last 168 hours. Coagulation profile No results for input(s): INR, PROTIME in the last 168 hours.  CBC: Recent Labs  Lab 06/19/18 1134 06/20/18 0519 06/20/18 0732 06/21/18 0258 06/22/18 0427  WBC 6.3   --  5.8 6.4 5.5  NEUTROABS 3.0  --  2.7 2.8  --   HGB 10.8*  --  10.7* 10.1* 10.3*  HCT 33.7* 29.5* 32.5* 30.5* 30.0*  MCV 92.6  --  90.0 90.2 90.6  PLT 214  --  200 190 187   Cardiac Enzymes: Recent Labs  Lab 06/19/18 1134 06/19/18 1524 06/19/18 1853  TROPONINI <0.03 <0.03 <0.03   BNP (last 3 results) No results for input(s): PROBNP in the last 8760 hours. CBG: No results for input(s): GLUCAP in the last 168 hours. D-Dimer: No results for input(s): DDIMER in the last 72 hours. Hgb A1c: Recent Labs    06/20/18 0519  HGBA1C 6.0*   Lipid Profile: Recent Labs    06/20/18 0519  CHOL 113  HDL 23*  LDLCALC 55  TRIG 177*  CHOLHDL 4.9   Thyroid function studies: No results for input(s): TSH, T4TOTAL, T3FREE, THYROIDAB in the last 72 hours.  Invalid input(s): FREET3 Anemia work up: Recent Labs    06/20/18 0519  VITAMINB12 488   Sepsis Labs: Recent Labs  Lab 06/19/18 1134 06/20/18 0732 06/21/18 0258 06/22/18 0427  WBC 6.3 5.8 6.4 5.5    Microbiology Recent Results (from the past 240 hour(s))  Surgical PCR screen     Status: None   Collection Time: 06/22/18 12:08 AM  Result Value Ref Range Status   MRSA, PCR NEGATIVE NEGATIVE Final   Staphylococcus aureus NEGATIVE NEGATIVE Final    Comment: (NOTE) The Xpert SA Assay (FDA approved for NASAL specimens in patients 59 years of age and older), is one component of a comprehensive surveillance program. It is not intended to diagnose infection nor to guide or monitor treatment. Performed at [facility], [place] [address] [place], [place] [address]     Procedures and diagnostic studies:  No results found.  Medications:   . amLODipine  10 mg Oral Daily  . aspirin EC  81 mg Oral Daily  . atorvastatin  20 mg Oral Daily  . cholecalciferol  1,000 Units Oral Daily  . diazepam  2.5-5 mg Oral QHS  . enoxaparin (LOVENOX) injection  30 mg Subcutaneous Q24H  . fenofibrate  160 mg Oral Daily  . furosemide   20 mg Oral Q M,W,F  . gabapentin  300 mg Oral BID  . Melatonin  9 mg Oral QHS  . propranolol  40 mg Oral BID  . sodium chloride flush  3 mL Intravenous Q12H  . tamsulosin  0.4 mg Oral QHS  . tiZANidine  4 mg Oral QHS  . vitamin B-12  1,000 mcg Oral Daily   Continuous Infusions: . sodium chloride       LOS: 1 day   Domenic Polite MD [facility]  06/22/2018, 2:05 PM

## 2018-06-22 NOTE — Progress Notes (Signed)
Pre CABG has been completed.   Preliminary results in CV Proc.   Kevin [name] 06/22/2018 4:15 PM

## 2018-06-22 NOTE — Progress Notes (Addendum)
Progress Note  Patient Name: Kevin [name] Date of Encounter: 06/22/2018  Primary Cardiologist: Previously seen by Dr. Curt [name] in 2017.  Subjective   No significant overnight events. Patient reports very mild left-sided chest pain that patient's describes as an "ache". He states this is not abnormal for him. No shortness of breath.  Inpatient Medications    Scheduled Meds: . amLODipine  10 mg Oral Daily  . aspirin EC  81 mg Oral Daily  . atorvastatin  20 mg Oral Daily  . cholecalciferol  1,000 Units Oral Daily  . diazepam  2.5-5 mg Oral QHS  . enoxaparin (LOVENOX) injection  30 mg Subcutaneous Q24H  . fenofibrate  160 mg Oral Daily  . furosemide  20 mg Oral Q M,W,F  . gabapentin  300 mg Oral BID  . Melatonin  9 mg Oral QHS  . propranolol  40 mg Oral BID  . sodium chloride flush  3 mL Intravenous Q12H  . tamsulosin  0.4 mg Oral QHS  . tiZANidine  4 mg Oral QHS  . vitamin B-12  1,000 mcg Oral Daily   Continuous Infusions: . sodium chloride     PRN Meds: sodium chloride, acetaminophen, albuterol, nitroGLYCERIN, ondansetron (ZOFRAN) IV, oxyCODONE-acetaminophen, sodium chloride flush   Vital Signs    Vitals:   06/21/18 1501 06/21/18 1525 06/21/18 1957 06/22/18 0543  BP: (!) 115/92 135/60 (!) 138/49 (!) 118/99  Pulse: 61 (!) 57 (!) 55 (!) 54  Resp:  13  20  Temp:  97.6 F (36.4 C) 98 F (36.7 C) (!) 97.4 F (36.3 C)  TempSrc:   Oral Oral  SpO2:  100% 97% 98%  Weight:    78.2 kg  Height:        Intake/Output Summary (Last 24 hours) at 06/22/2018 0858 Last data filed at 06/21/2018 2130 Gross per 24 hour  Intake 953.33 ml  Output 375 ml  Net 578.33 ml   Last 3 Weights 06/22/2018 06/21/2018 06/20/2018  Weight (lbs) 172 lb 8 oz 173 lb 4.8 oz 172 lb 13.5 oz  Weight (kg) 78.245 kg 78.608 kg 78.4 kg      Telemetry    Sinus rhythm with heart rates mostly in the high 40's to 50's but occasionally will be briefly in the 80's. 1st degree AV block noted.  - Personally  Reviewed  ECG    No new tracing today. - Personally Reviewed  Physical Exam   GEN: Caucasian male resting comfortably. Alert and in no acute distress.   Neck: Supple. Cardiac:  Mildly bradycardic with regular rhythm. No significant murmurs, gallops, or rubs. Right radial cath site soft with no signs of hematoma. Respiratory: Clear to auscultation bilaterally. No wheezes, rhonchi, or rales. GI: Abdomen soft, non-tender, non-distended. Bowel sounds present. MS: No lower extremity edema. No deformity. Skin: Warm and dry. Neuro:  No focal deficits. Psych: Normal affect.  Labs    Chemistry Recent Labs  Lab 06/20/18 0732 06/21/18 0258 06/22/18 0427  NA 137 137 138  K 3.7 3.9 3.9  CL 106 106 108  CO2 23 24 24   GLUCOSE 113* 112* 107*  BUN 25* 26* 20  CREATININE 2.05* 2.04* 2.00*  CALCIUM 8.6* 8.3* 8.6*  GFRNONAA 30* 30* 31*  GFRAA 35* 35* 36*  ANIONGAP 8 7 6      Hematology Recent Labs  Lab 06/20/18 0732 06/21/18 0258 06/22/18 0427  WBC 5.8 6.4 5.5  RBC 3.61* 3.38* 3.31*  HGB 10.7* 10.1* 10.3*  HCT 32.5* 30.5* 30.0*  MCV 90.0 90.2 90.6  MCH 29.6 29.9 31.1  MCHC 32.9 33.1 34.3  RDW 13.2 13.2 13.3  PLT 200 190 187    Cardiac Enzymes Recent Labs  Lab 06/19/18 1134 06/19/18 1524 06/19/18 1853  TROPONINI <0.03 <0.03 <0.03   No results for input(s): TROPIPOC in the last 168 hours.   BNPNo results for input(s): BNP, PROBNP in the last 168 hours.   DDimer No results for input(s): DDIMER in the last 168 hours.   Radiology    No results found.  Cardiac Studies   Echocardiogram 06/20/2018: Study Conclusions: - Left ventricle: The cavity size was normal. There was mild   concentric hypertrophy. Systolic function was normal. The   estimated ejection fraction was in the range of 60% to 65%. Wall   motion was normal; there were no regional wall motion   abnormalities. Doppler parameters are consistent with abnormal   left ventricular relaxation (grade 1  diastolic dysfunction).   Doppler parameters are consistent with high ventricular filling   pressure. - Aortic valve: Transvalvular velocity was within the normal range.   There was no stenosis. There was no regurgitation. Valve area   (VTI): 2.17 cm^2. Valve area (Vmean): 2.45 cm^2. - Mitral valve: Mildly calcified annulus. Transvalvular velocity   was within the normal range. There was no evidence for stenosis.   There was no regurgitation. - Left atrium: The atrium was mildly dilated. - Right ventricle: The cavity size was normal. Wall thickness was   normal. Systolic function was normal. - Tricuspid valve: There was trivial regurgitation. - Pulmonary arteries: Systolic pressure was within the normal   range. PA peak pressure: 24 mm Hg (S). _______________  Left Heart Catheterization 06/21/2018:  Prox RCA lesion is 100% stenosed. Left to right collaterals.  2nd Mrg lesion is 80% stenosed.  Ost 1st Diag to 1st Diag lesion is 90% stenosed.  Prox LAD lesion is 80% stenosed.  LV end diastolic pressure is normal.  There is no aortic valve stenosis.   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  Severe disease in a large diagonal.  Eccentric lesion in an OM, best seen in the AP caudal view.  Chronically occluded dominant RCA.   Hold Plavix.  Plan for cardiac surgery consult.   Patient Profile   Kevin [name] is a 78 y.o. male with a history of hypertension, hyperlipidemia, bradycardia, open AAA repair, bilateral carotid artery occlusion s/p endarterectomy, TIA x2 on Plavix, rheumatoid arthritis, CKD stage III-IV who presented to the [facility] ED on 06/19/2018 for evaluation of chest pain. Patient underwent cardiac catheterization on 06/21/2018 which showed severe multivessel disease. CT surgery was consulted for possible CABG.  Assessment & Plan    Chest Pain with Severe Multivessel CAD - Left heart catheterization showed severe multivessel CAD with 100%  stenosis of proximal RCA (left to right collaterals present), 80% of 2nd Mrg, 90% stenosis of Ost 1st Diag to 1st Diag, and 80% of proximal LAD. - CT surgery was consulted and recommended CABG after Plavix washout. Surgery tentatively planned for Wednesday 06/27/2018.  - Continue Aspirin, statin, and beta-blocker.  Bradycardia - Heart rates mostly in the 50's on telemetry. No pauses noted. - Currently on home Propranolol 40mg  twice daily. Continue as heart rate allows. - Patient is asymptomatic.  - Will continue to monitor on telemetry.  Hypertension - Most recent BP 118/99.  - Continue Amlodipine 10mg  daily.  Hyperlipidemia - Lipid panel this admission: Cholesterol 113, Triglycerides 177, HDL  23, LDL 55. - LDL at goal of <70 given CAD.  Increase lipitor to 40 mg   Pre-Diabetes - Hemoglobin A1c 6% this admission, consistent with pre-diabetes. - Will defer to primary team and PCP.  CKD Stage III-IV - Serum creatinine 2.09 on admission. Baseline appears to be 1.9 to 2.2. - Creatinine stable at 2.00 today. - Continue to monitor.  Normocytic Anemia - Hemoglobin 10.8 on admission. Was 11.5 three months ago. - Hemoglobin stable at 10.3 today. - Per primary team.   For questions or updates, please contact [place] Please consult [email or web address] for contact info under        Signed, Darreld Mclean, PA-C  06/22/2018, 8:58 AM    Patient seen and examined   I agree with findings as noted by Virgie Dad above CV surgery has seen pt   Plan for CABG on Wed.   WIll stay in hospital until then  On exam, pt comfortable  Lungs are CTA   Cardiac exam:   RRR   NO S3  Abd is benign   Ext are without edema  Keep on same regimen for now.   Will take over primary position since waiting for CABG  Dorris Carnes

## 2018-06-23 DIAGNOSIS — R7303 Prediabetes: Secondary | ICD-10-CM

## 2018-06-23 DIAGNOSIS — N183 Chronic kidney disease, stage 3 (moderate): Secondary | ICD-10-CM

## 2018-06-23 DIAGNOSIS — I200 Unstable angina: Secondary | ICD-10-CM

## 2018-06-23 DIAGNOSIS — E785 Hyperlipidemia, unspecified: Secondary | ICD-10-CM

## 2018-06-23 DIAGNOSIS — I10 Essential (primary) hypertension: Secondary | ICD-10-CM

## 2018-06-23 DIAGNOSIS — R001 Bradycardia, unspecified: Secondary | ICD-10-CM

## 2018-06-23 DIAGNOSIS — D649 Anemia, unspecified: Secondary | ICD-10-CM

## 2018-06-23 LAB — BASIC METABOLIC PANEL
Anion gap: 11 (ref 5–15)
BUN: 10 mg/dL (ref 8–23)
CO2: 22 mmol/L (ref 22–32)
Calcium: 8.9 mg/dL (ref 8.9–10.3)
Chloride: 106 mmol/L (ref 98–111)
Creatinine, Ser: 1.23 mg/dL (ref 0.61–1.24)
GFR calc Af Amer: >60 mL/min (ref >60)
GFR calc non Af Amer: 55 mL/min — ABNORMAL LOW (ref >60)
Glucose, Bld: 94 mg/dL (ref 70–99)
POTASSIUM: 3.4 mmol/L — AB (ref 3.5–5.1)
Sodium: 139 mmol/L (ref 135–145)

## 2018-06-23 LAB — CBC
HEMATOCRIT: 31.1 % — AB (ref 39.0–52.0)
Hemoglobin: 9.6 g/dL — ABNORMAL LOW (ref 13.0–17.0)
MCH: 27 pg (ref 26.0–34.0)
MCHC: 30.9 g/dL (ref 30.0–36.0)
MCV: 87.6 fL (ref 80.0–100.0)
Platelets: 203 10*3/uL (ref 150–400)
RBC: 3.55 MIL/uL — ABNORMAL LOW (ref 4.22–5.81)
RDW: 14.1 % (ref 11.5–15.5)
WBC: 6.3 10*3/uL (ref 4.0–10.5)
nRBC: 0 % (ref 0.0–0.2)

## 2018-06-23 MED ORDER — POTASSIUM CHLORIDE CRYS ER 20 MEQ PO TBCR
40.0000 meq | EXTENDED_RELEASE_TABLET | Freq: Once | ORAL | Status: AC
  Administered 2018-06-23: 40 meq via ORAL
  Filled 2018-06-23: qty 2

## 2018-06-23 MED ORDER — DOCUSATE SODIUM 100 MG PO CAPS
100.0000 mg | ORAL_CAPSULE | Freq: Every day | ORAL | Status: DC | PRN
  Administered 2018-06-26: 100 mg via ORAL
  Filled 2018-06-23 (×2): qty 1

## 2018-06-23 NOTE — Progress Notes (Signed)
CARDIAC REHAB PHASE I   PRE:  Rate/Rhythm: 56 SB   BP:  Sitting: 123/64      SaO2: 97% RA  MODE:  Ambulation: 400 ft   POST:  Rate/Rhythm: 75 SR  BP:  Sitting: 133/52      SaO2: 98% RA  Pt ambulated 444ft, independently with steady gait. Pt denied CP or dizziness. Pt did report mild SOB near end of walk. Encouraged pursed lip breathing. Pt returned to recliner. SOB resolved with sitting.  Encouraged continued use of IS and ambulation. Call bell and phone within reach.   [phone]  Carma Lair MS, ACSM CEP  8:52 AM 06/23/2018

## 2018-06-23 NOTE — Progress Notes (Signed)
Pt seen and examined, no complaints, feels well, awaiting CABG on Wednesday after Plavix wash out  Domenic Polite, MD

## 2018-06-23 NOTE — Progress Notes (Signed)
Progress Note  Patient Name: Kevin [name] Date of Encounter: 06/23/2018  Primary Cardiologist: Previously seen by Dr. Curt Bears in 2017.  Subjective   He denies chest pain and shortness of breath at rest.  He had some mild shortness of breath after finishing walking with cardiac rehab this morning.  This resolved with rest. He ate a good breakfast this morning.  He has some mild constipation.  Inpatient Medications    Scheduled Meds: . amLODipine  10 mg Oral Daily  . aspirin EC  81 mg Oral Daily  . atorvastatin  20 mg Oral Daily  . cholecalciferol  1,000 Units Oral Daily  . diazepam  2.5-5 mg Oral QHS  . enoxaparin (LOVENOX) injection  30 mg Subcutaneous Q24H  . fenofibrate  160 mg Oral Daily  . furosemide  20 mg Oral Q M,W,F  . gabapentin  300 mg Oral BID  . Melatonin  9 mg Oral QHS  . propranolol  40 mg Oral BID  . sodium chloride flush  3 mL Intravenous Q12H  . tamsulosin  0.4 mg Oral QHS  . tiZANidine  4 mg Oral QHS  . vitamin B-12  1,000 mcg Oral Daily   Continuous Infusions: . sodium chloride     PRN Meds: sodium chloride, acetaminophen, albuterol, nitroGLYCERIN, ondansetron (ZOFRAN) IV, oxyCODONE-acetaminophen, sodium chloride flush   Vital Signs    Vitals:   06/22/18 1040 06/22/18 1634 06/22/18 2104 06/23/18 0415  BP: (!) 165/71 132/61  125/60  Pulse: 60 (!) 56 62 (!) 51  Resp:  18  17  Temp:  97.8 F (36.6 C)  98.1 F (36.7 C)  TempSrc:  Oral  Oral  SpO2:  98%  98%  Weight:    77.9 kg  Height:        Intake/Output Summary (Last 24 hours) at 06/23/2018 0953 Last data filed at 06/22/2018 1300 Gross per 24 hour  Intake 243 ml  Output -  Net 243 ml   Filed Weights   06/21/18 0911 06/22/18 0543 06/23/18 0415  Weight: 78.6 kg 78.2 kg 77.9 kg    Telemetry    Sinus bradycardia- Personally Reviewed  ECG    No new tracings- Personally Reviewed  Physical Exam   GEN: No acute distress.   Neck: No JVD Cardiac:  Bradycardic, regular rhythm, no  murmurs, rubs, or gallops.  Respiratory: Clear to auscultation bilaterally. GI: Soft, nontender, non-distended  MS: No edema; No deformity. Neuro:  Nonfocal  Psych: Normal affect   Labs    Chemistry Recent Labs  Lab 06/21/18 0258 06/22/18 0427 06/23/18 0345  NA 137 138 139  K 3.9 3.9 3.4*  CL 106 108 106  CO2 24 24 22   GLUCOSE 112* 107* 94  BUN 26* 20 10  CREATININE 2.04* 2.00* 1.23  CALCIUM 8.3* 8.6* 8.9  GFRNONAA 30* 31* 55*  GFRAA 35* 36* >60  ANIONGAP 7 6 11      Hematology Recent Labs  Lab 06/21/18 0258 06/22/18 0427 06/22/18 1450 06/23/18 0345  WBC 6.4 5.5  --  6.3  RBC 3.38* 3.31* 3.75* 3.55*  HGB 10.1* 10.3*  --  9.6*  HCT 30.5* 30.0*  --  31.1*  MCV 90.2 90.6  --  87.6  MCH 29.9 31.1  --  27.0  MCHC 33.1 34.3  --  30.9  RDW 13.2 13.3  --  14.1  PLT 190 187  --  203    Cardiac Enzymes Recent Labs  Lab 06/19/18 1134 06/19/18 1524 06/19/18  [place] <0.03 <0.03 <0.03   No results for input(s): TROPIPOC in the last 168 hours.   BNPNo results for input(s): BNP, PROBNP in the last 168 hours.   DDimer No results for input(s): DDIMER in the last 168 hours.   Lipid Panel     Component Value Date/Time   CHOL 113 06/20/2018 0519   TRIG 177 (H) 06/20/2018 0519   HDL 23 (L) 06/20/2018 0519   CHOLHDL 4.9 06/20/2018 0519   VLDL 35 06/20/2018 0519   LDLCALC 55 06/20/2018 0519   LDLDIRECT 134.9 12/26/2012 1201     Radiology    Vas US Doppler Pre Cabg  Result Date: 06/22/2018 PREOPERATIVE VASCULAR EVALUATION  Indications: Pre cabg. Performing Technologist: Abram Sander RVS  Examination Guidelines: A complete evaluation includes B-mode imaging, spectral Doppler, color Doppler, and power Doppler as needed of all accessible portions of each vessel. Bilateral testing is considered an integral part of a complete examination. Limited examinations for reoccurring indications may be performed as noted.  Right Carotid Findings:  +----------+--------+--------+--------+-----------+--------+           PSV cm/sEDV cm/sStenosisDescribe   Comments +----------+--------+--------+--------+-----------+--------+ CCA Prox  32      9               homogeneous         +----------+--------+--------+--------+-----------+--------+ CCA Distal47      14              homogeneous         +----------+--------+--------+--------+-----------+--------+ ICA Prox  124     30      1-39%   homogeneous         +----------+--------+--------+--------+-----------+--------+ ICA Distal107     30                                  +----------+--------+--------+--------+-----------+--------+ ECA       150     16                                  +----------+--------+--------+--------+-----------+--------+ Portions of this table do not appear on this page. +----------+--------+-------+--------+------------+           PSV cm/sEDV cmsDescribeArm Pressure +----------+--------+-------+--------+------------+ Subclavian145                    165          +----------+--------+-------+--------+------------+ +---------+--------+--+--------+--+---------+ VertebralPSV cm/s82EDV cm/s17Antegrade +---------+--------+--+--------+--+---------+ Left Carotid Findings: +----------+--------+--------+--------+-----------+--------+           PSV cm/sEDV cm/sStenosisDescribe   Comments +----------+--------+--------+--------+-----------+--------+ CCA Prox  55      10              homogeneous         +----------+--------+--------+--------+-----------+--------+ CCA Distal68      10              homogeneous         +----------+--------+--------+--------+-----------+--------+ ICA Prox  113     27      1-39%   homogeneous         +----------+--------+--------+--------+-----------+--------+ ICA Distal85      25                                  +----------+--------+--------+--------+-----------+--------+ ECA  97                                          +----------+--------+--------+--------+-----------+--------+ +----------+--------+--------+--------+------------+ SubclavianPSV cm/sEDV cm/sDescribeArm Pressure +----------+--------+--------+--------+------------+           180                     165          +----------+--------+--------+--------+------------+ +---------+--------+--+--------+--+---------+ VertebralPSV cm/s72EDV cm/s10Antegrade +---------+--------+--+--------+--+---------+  ABI Findings: +--------+------------------+-----+----------+--------+ Right   Rt Pressure (mmHg)IndexWaveform  Comment  +--------+------------------+-----+----------+--------+ [id number]                    triphasic          +--------+------------------+-----+----------+--------+ PTA     89                0.54 monophasic         +--------+------------------+-----+----------+--------+ DP      109               0.66 monophasic         +--------+------------------+-----+----------+--------+ +--------+------------------+-----+----------+-------+ Left    Lt Pressure (mmHg)IndexWaveform  Comment +--------+------------------+-----+----------+-------+ [id number]                    triphasic         +--------+------------------+-----+----------+-------+ PTA     97                0.59 biphasic          +--------+------------------+-----+----------+-------+ DP      92                0.56 monophasic        +--------+------------------+-----+----------+-------+ +-------+---------------+----------------+ ABI/TBIToday's ABI/TBIPrevious ABI/TBI +-------+---------------+----------------+ Right  0.66                            +-------+---------------+----------------+ Left   0.59                            +-------+---------------+----------------+  Right Doppler Findings: +--------+--------+-----+---------+--------+ Site    PressureIndexDoppler  Comments  +--------+--------+-----+---------+--------+ [id number]          triphasic         +--------+--------+-----+---------+--------+ Radial               triphasic         +--------+--------+-----+---------+--------+ Ulnar                triphasic         +--------+--------+-----+---------+--------+  Left Doppler Findings: +--------+--------+-----+---------+--------+ Site    PressureIndexDoppler  Comments +--------+--------+-----+---------+--------+ [id number]          triphasic         +--------+--------+-----+---------+--------+ Radial               triphasic         +--------+--------+-----+---------+--------+ Ulnar                triphasic         +--------+--------+-----+---------+--------+  Summary: Right Carotid: Velocities in the right ICA are consistent with a 1-39% stenosis. Left Carotid: Velocities in the left ICA are consistent with a 1-39% stenosis. Vertebrals: Bilateral vertebral arteries demonstrate antegrade flow. Right ABI: Resting right ankle-brachial index indicates moderate right lower extremity arterial disease. Left ABI: Resting left  ankle-brachial index indicates moderate left lower extremity arterial disease. Right Upper Extremity: Doppler waveform obliterate with right radial compression. Doppler waveforms remain within normal limits with right ulnar compression. Left Upper Extremity: Doppler waveform obliterate with left radial compression. Doppler waveforms remain within normal limits with left ulnar compression.     Preliminary     Cardiac Studies   Echocardiogram 06/20/2018: Study Conclusions: - Left ventricle: The cavity size was normal. There was mild concentric hypertrophy. Systolic function was normal. The estimated ejection fraction was in the range of 60% to 65%. Wall motion was normal; there were no regional wall motion abnormalities. Doppler parameters are consistent with abnormal left ventricular relaxation (grade 1  diastolic dysfunction). Doppler parameters are consistent with high ventricular filling pressure. - Aortic valve: Transvalvular velocity was within the normal range. There was no stenosis. There was no regurgitation. Valve area (VTI): 2.17 cm^2. Valve area (Vmean): 2.45 cm^2. - Mitral valve: Mildly calcified annulus. Transvalvular velocity was within the normal range. There was no evidence for stenosis. There was no regurgitation. - Left atrium: The atrium was mildly dilated. - Right ventricle: The cavity size was normal. Wall thickness was normal. Systolic function was normal. - Tricuspid valve: There was trivial regurgitation. - Pulmonary arteries: Systolic pressure was within the normal range. PA peak pressure: 24 mm Hg (S). _______________  Left Heart Catheterization 06/21/2018:  Prox RCA lesion is 100% stenosed. Left to right collaterals.  2nd Mrg lesion is 80% stenosed.  Ost 1st Diag to 1st Diag lesion is 90% stenosed.  Prox LAD lesion is 80% stenosed.  LV end diastolic pressure is normal.  There is no aortic valve stenosis.  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 Severe disease in a large diagonal. Eccentric lesion in an OM, best seen in the AP caudal view. Chronically occluded dominant RCA.   Hold Plavix.  Plan for cardiac surgery consult.   Patient Profile     78 y.o. male with a history of hypertension, hyperlipidemia, bradycardia, open AAA repair, bilateral carotid artery occlusion s/p endarterectomy, TIA x2 on Plavix, rheumatoid arthritis, CKD stage III-IVwho presented to the [facility] ED on 06/19/2018 for evaluation of chest pain. Patient underwent cardiac catheterization on 06/21/2018 which showed severe multivessel disease. CT surgery was consulted for possible CABG.  Assessment & Plan    1.  Chest pain with severe multivessel coronary artery disease: Cardiac catheterization results reviewed above.  CABG has  been recommended after Plavix washout.  Surgery tentatively planned for 06/27/2018.  Continue aspirin, statin, and beta-blocker.  2.  Bradycardia: Heart rate currently in 50 bpm range.  No pauses noted.  He remains on propranolol 40 mg twice daily.  No changes to therapy.  3.  Hypertension: Blood pressure is normal.  No changes to therapy.  4.  Hyperlipidemia: Lipid panel from 06/20/2018 reviewed above with LDL at goal, 55.  Continue atorvastatin.  4.  Prediabetes: A1c 6% this admission.  5.  Chronic kidney disease stage III/IV: Creatinine down to 1.23 today from 2 yesterday.  6.  Anemia: Hemoglobin 9.6 today, 10.3 yesterday.  We will continue to monitor.      For questions or updates, please contact [place] Please consult [email or web address] for contact info under Cardiology/STEMI.      Signed, Kate Sable, MD  06/23/2018, 9:53 AM

## 2018-06-24 DIAGNOSIS — I25118 Atherosclerotic heart disease of native coronary artery with other forms of angina pectoris: Secondary | ICD-10-CM

## 2018-06-24 DIAGNOSIS — I208 Other forms of angina pectoris: Secondary | ICD-10-CM

## 2018-06-24 LAB — CBC
HEMATOCRIT: 34 % — AB (ref 39.0–52.0)
Hemoglobin: 11.1 g/dL — ABNORMAL LOW (ref 13.0–17.0)
MCH: 29.8 pg (ref 26.0–34.0)
MCHC: 32.6 g/dL (ref 30.0–36.0)
MCV: 91.4 fL (ref 80.0–100.0)
Platelets: 219 10*3/uL (ref 150–400)
RBC: 3.72 MIL/uL — ABNORMAL LOW (ref 4.22–5.81)
RDW: 13.2 % (ref 11.5–15.5)
WBC: 6.9 10*3/uL (ref 4.0–10.5)
nRBC: 0 % (ref 0.0–0.2)

## 2018-06-24 LAB — BASIC METABOLIC PANEL
Anion gap: 9 (ref 5–15)
BUN: 26 mg/dL — ABNORMAL HIGH (ref 8–23)
CHLORIDE: 104 mmol/L (ref 98–111)
CO2: 23 mmol/L (ref 22–32)
Calcium: 8.9 mg/dL (ref 8.9–10.3)
Creatinine, Ser: 2.18 mg/dL — ABNORMAL HIGH (ref 0.61–1.24)
GFR calc Af Amer: 32 mL/min — ABNORMAL LOW (ref >60)
GFR calc non Af Amer: 28 mL/min — ABNORMAL LOW (ref >60)
Glucose, Bld: 133 mg/dL — ABNORMAL HIGH (ref 70–99)
Potassium: 4.2 mmol/L (ref 3.5–5.1)
SODIUM: 136 mmol/L (ref 135–145)

## 2018-06-24 MED ORDER — MORPHINE SULFATE (PF) 2 MG/ML IV SOLN
1.0000 mg | INTRAVENOUS | Status: DC | PRN
  Administered 2018-06-24 – 2018-06-25 (×2): 1 mg via INTRAVENOUS
  Filled 2018-06-24 (×2): qty 1

## 2018-06-24 MED ORDER — NITROGLYCERIN 0.4 MG SL SUBL
0.4000 mg | SUBLINGUAL_TABLET | SUBLINGUAL | Status: DC | PRN
  Administered 2018-06-25: 0.4 mg via SUBLINGUAL
  Filled 2018-06-24 (×2): qty 1

## 2018-06-24 MED ORDER — DIPHENHYDRAMINE HCL 25 MG PO CAPS: 50.0000 mg | ORAL_CAPSULE | Freq: Three times a day (TID) | ORAL | Status: DC | PRN

## 2018-06-24 NOTE — Progress Notes (Signed)
Patients pain in down to a 1/10 BP 140/74. [place] looked at EKG and gave orders for morphine. I will closely monitor. Patient knows to call for any recurrent pain

## 2018-06-24 NOTE — Progress Notes (Signed)
Patient denies chest pain after morphine. Instructed him to call for any other issues

## 2018-06-24 NOTE — Plan of Care (Signed)
  Problem: Education: Goal: Knowledge of General Education information will improve Description Including pain rating scale, medication(s)/side effects and non-pharmacologic comfort measures Outcome: Progressing   Problem: Health Behavior/Discharge Planning: Goal: Ability to manage health-related needs will improve Outcome: Progressing   Problem: Clinical Measurements: Goal: Cardiovascular complication will be avoided Outcome: Progressing   Problem: Safety: Goal: Ability to remain free from injury will improve Outcome: Progressing   Problem: Clinical Measurements: Goal: Respiratory complications will improve Outcome: Completed/Met

## 2018-06-24 NOTE — Progress Notes (Signed)
Patient called out in 5/10 mid to left sternum. BP 151/68, o2 applied at 4l [place], EKG done and 1st SL NTG given. I paged [place] NP. Awaiting return of call

## 2018-06-24 NOTE — Progress Notes (Signed)
   Received a call from the patient's nurse that he was having chest discomfort. He is currently awaiting CABG which is tentatively scheduled for next Wednesday. This was his first episode of chest discomfort since cardiac catheterization on 06/21/2018.  EKG performed and showed no acute ischemic changes when compared to prior tracings. He was given sublingual nitroglycerin x3 with pain improving to a 1 out of 10. Will write order for PRN Morphine.   If he has recurrent pain between now and Wednesday, would consider initiation of IV Heparin and NTG drip. If starting Heparin, will need to follow Hgb closely as this was 9.6 yesterday, improved to 11.1 today.   Signed, Erma Heritage, PA-C 06/24/2018, 5:10 PM Pager: [phone]

## 2018-06-24 NOTE — Progress Notes (Signed)
Patient complaining of 3/10 chest pain after the first dose of nitro SL. Bp 137/86, Sinus brady 57 ( patient has been bradycardic all day). Second SL NTG given and pressure/pain down to 1.5 per patient

## 2018-06-24 NOTE — Progress Notes (Signed)
No changes, feels well, no complaints -I suspect creatinine of 1.2 from yesterday was a lab error his baseline is 2, creatinine stable at 2.1 today -awaiting CABG  Domenic Polite, MD

## 2018-06-24 NOTE — Progress Notes (Signed)
Progress Note  Patient Name: Kevin [name] Date of Encounter: 06/24/2018  Primary Cardiologist: Previously seen by Dr. Curt Bears in 2017.  Subjective   He is doing well this morning denies chest pain, palpitations, shortness of breath.  He is in good spirits.  Inpatient Medications    Scheduled Meds: . amLODipine  10 mg Oral Daily  . aspirin EC  81 mg Oral Daily  . atorvastatin  20 mg Oral Daily  . cholecalciferol  1,000 Units Oral Daily  . diazepam  2.5-5 mg Oral QHS  . enoxaparin (LOVENOX) injection  30 mg Subcutaneous Q24H  . fenofibrate  160 mg Oral Daily  . furosemide  20 mg Oral Q M,W,F  . gabapentin  300 mg Oral BID  . Melatonin  9 mg Oral QHS  . propranolol  40 mg Oral BID  . sodium chloride flush  3 mL Intravenous Q12H  . tamsulosin  0.4 mg Oral QHS  . tiZANidine  4 mg Oral QHS  . vitamin B-12  1,000 mcg Oral Daily   Continuous Infusions: . sodium chloride     PRN Meds: sodium chloride, acetaminophen, albuterol, docusate sodium, nitroGLYCERIN, ondansetron (ZOFRAN) IV, oxyCODONE-acetaminophen, sodium chloride flush   Vital Signs    Vitals:   06/23/18 1437 06/23/18 1941 06/24/18 0456 06/24/18 0833  BP: (!) 145/67 137/78 117/64 (!) 126/93  Pulse: (!) 53 (!) 57 (!) 54   Resp:  17 19   Temp: (!) 97.5 F (36.4 C) 97.6 F (36.4 C) 97.7 F (36.5 C)   TempSrc: Oral Oral Oral   SpO2: 100% 98% 96%   Weight:   78.9 kg   Height:        Intake/Output Summary (Last 24 hours) at 06/24/2018 0900 Last data filed at 06/23/2018 1800 Gross per 24 hour  Intake 486 ml  Output -  Net 486 ml   Filed Weights   06/22/18 0543 06/23/18 0415 06/24/18 0456  Weight: 78.2 kg 77.9 kg 78.9 kg    Telemetry    Sinus bradycardia/sinus rhythm, heart rate 50-60 bpm range- Personally Reviewed  ECG    No new tracings- Personally Reviewed  Physical Exam   GEN: No acute distress.   Neck: No JVD Cardiac: RRR, no murmurs, rubs, or gallops.  Respiratory: Clear to auscultation  bilaterally. GI: Soft, nontender, non-distended  MS: No edema; No deformity. Neuro:  Nonfocal  Psych: Normal affect   Labs    Chemistry Recent Labs  Lab 06/21/18 0258 06/22/18 0427 06/23/18 0345  NA 137 138 139  K 3.9 3.9 3.4*  CL 106 108 106  CO2 24 24 22   GLUCOSE 112* 107* 94  BUN 26* 20 10  CREATININE 2.04* 2.00* 1.23  CALCIUM 8.3* 8.6* 8.9  GFRNONAA 30* 31* 55*  GFRAA 35* 36* >60  ANIONGAP 7 6 11      Hematology Recent Labs  Lab 06/21/18 0258 06/22/18 0427 06/22/18 1450 06/23/18 0345  WBC 6.4 5.5  --  6.3  RBC 3.38* 3.31* 3.75* 3.55*  HGB 10.1* 10.3*  --  9.6*  HCT 30.5* 30.0*  --  31.1*  MCV 90.2 90.6  --  87.6  MCH 29.9 31.1  --  27.0  MCHC 33.1 34.3  --  30.9  RDW 13.2 13.3  --  14.1  PLT 190 187  --  203    Cardiac Enzymes Recent Labs  Lab 06/19/18 1134 06/19/18 1524 06/19/18 1853  TROPONINI <0.03 <0.03 <0.03   No results for input(s): TROPIPOC in  the last 168 hours.   BNPNo results for input(s): BNP, PROBNP in the last 168 hours.   DDimer No results for input(s): DDIMER in the last 168 hours.   Radiology    Vas US Doppler Pre Cabg  Result Date: 06/23/2018 PREOPERATIVE VASCULAR EVALUATION  Indications: Pre cabg. Performing Technologist: Abram Sander RVS  Examination Guidelines: A complete evaluation includes B-mode imaging, spectral Doppler, color Doppler, and power Doppler as needed of all accessible portions of each vessel. Bilateral testing is considered an integral part of a complete examination. Limited examinations for reoccurring indications may be performed as noted.  Right Carotid Findings: +----------+--------+--------+--------+-----------+--------+           PSV cm/sEDV cm/sStenosisDescribe   Comments +----------+--------+--------+--------+-----------+--------+ CCA Prox  32      9               homogeneous         +----------+--------+--------+--------+-----------+--------+ CCA Distal47      14               homogeneous         +----------+--------+--------+--------+-----------+--------+ ICA Prox  124     30      1-39%   homogeneous         +----------+--------+--------+--------+-----------+--------+ ICA Distal107     30                                  +----------+--------+--------+--------+-----------+--------+ ECA       150     16                                  +----------+--------+--------+--------+-----------+--------+ Portions of this table do not appear on this page. +----------+--------+-------+--------+------------+           PSV cm/sEDV cmsDescribeArm Pressure +----------+--------+-------+--------+------------+ Subclavian145                    165          +----------+--------+-------+--------+------------+ +---------+--------+--+--------+--+---------+ VertebralPSV cm/s82EDV cm/s17Antegrade +---------+--------+--+--------+--+---------+ Left Carotid Findings: +----------+--------+--------+--------+-----------+--------+           PSV cm/sEDV cm/sStenosisDescribe   Comments +----------+--------+--------+--------+-----------+--------+ CCA Prox  55      10              homogeneous         +----------+--------+--------+--------+-----------+--------+ CCA Distal68      10              homogeneous         +----------+--------+--------+--------+-----------+--------+ ICA Prox  113     27      1-39%   homogeneous         +----------+--------+--------+--------+-----------+--------+ ICA Distal85      25                                  +----------+--------+--------+--------+-----------+--------+ ECA       97                                          +----------+--------+--------+--------+-----------+--------+ +----------+--------+--------+--------+------------+ SubclavianPSV cm/sEDV cm/sDescribeArm Pressure +----------+--------+--------+--------+------------+           180  165           +----------+--------+--------+--------+------------+ +---------+--------+--+--------+--+---------+ VertebralPSV cm/s72EDV cm/s10Antegrade +---------+--------+--+--------+--+---------+  ABI Findings: +--------+------------------+-----+----------+--------+ Right   Rt Pressure (mmHg)IndexWaveform  Comment  +--------+------------------+-----+----------+--------+ [id number]                    triphasic          +--------+------------------+-----+----------+--------+ PTA     89                0.54 monophasic         +--------+------------------+-----+----------+--------+ DP      109               0.66 monophasic         +--------+------------------+-----+----------+--------+ +--------+------------------+-----+----------+-------+ Left    Lt Pressure (mmHg)IndexWaveform  Comment +--------+------------------+-----+----------+-------+ [id number]                    triphasic         +--------+------------------+-----+----------+-------+ PTA     97                0.59 biphasic          +--------+------------------+-----+----------+-------+ DP      92                0.56 monophasic        +--------+------------------+-----+----------+-------+ +-------+---------------+----------------+ ABI/TBIToday's ABI/TBIPrevious ABI/TBI +-------+---------------+----------------+ Right  0.66                            +-------+---------------+----------------+ Left   0.59                            +-------+---------------+----------------+  Right Doppler Findings: +--------+--------+-----+---------+--------+ Site    PressureIndexDoppler  Comments +--------+--------+-----+---------+--------+ [id number]          triphasic         +--------+--------+-----+---------+--------+ Radial               triphasic         +--------+--------+-----+---------+--------+ Ulnar                triphasic         +--------+--------+-----+---------+--------+  Left Doppler  Findings: +--------+--------+-----+---------+--------+ Site    PressureIndexDoppler  Comments +--------+--------+-----+---------+--------+ [id number]          triphasic         +--------+--------+-----+---------+--------+ Radial               triphasic         +--------+--------+-----+---------+--------+ Ulnar                triphasic         +--------+--------+-----+---------+--------+  Summary: Right Carotid: Velocities in the right ICA are consistent with a 1-39% stenosis. Left Carotid: Velocities in the left ICA are consistent with a 1-39% stenosis. Vertebrals: Bilateral vertebral arteries demonstrate antegrade flow. Right ABI: Resting right ankle-brachial index indicates moderate right lower extremity arterial disease. Left ABI: Resting left ankle-brachial index indicates moderate left lower extremity arterial disease. Right Upper Extremity: Doppler waveform obliterate with right radial compression. Doppler waveforms remain within normal limits with right ulnar compression. Left Upper Extremity: Doppler waveform obliterate with left radial compression. Doppler waveforms remain within normal limits with left ulnar compression.  Electronically signed by Servando Snare MD on 06/23/2018 at 1:30:56 PM.    Final     Cardiac Studies   Echocardiogram 06/20/2018: Study Conclusions: - Left  ventricle: The cavity size was normal. There was mild concentric hypertrophy. Systolic function was normal. The estimated ejection fraction was in the range of 60% to 65%. Wall motion was normal; there were no regional wall motion abnormalities. Doppler parameters are consistent with abnormal left ventricular relaxation (grade 1 diastolic dysfunction). Doppler parameters are consistent with high ventricular filling pressure. - Aortic valve: Transvalvular velocity was within the normal range. There was no stenosis. There was no regurgitation. Valve area (VTI): 2.17 cm^2. Valve area  (Vmean): 2.45 cm^2. - Mitral valve: Mildly calcified annulus. Transvalvular velocity was within the normal range. There was no evidence for stenosis. There was no regurgitation. - Left atrium: The atrium was mildly dilated. - Right ventricle: The cavity size was normal. Wall thickness was normal. Systolic function was normal. - Tricuspid valve: There was trivial regurgitation. - Pulmonary arteries: Systolic pressure was within the normal range. PA peak pressure: 24 mm Hg (S). _______________  Left Heart Catheterization 06/21/2018:  Prox RCA lesion is 100% stenosed. Left to right collaterals.  2nd Mrg lesion is 80% stenosed.  Ost 1st Diag to 1st Diag lesion is 90% stenosed.  Prox LAD lesion is 80% stenosed.  LV end diastolic pressure is normal.  There is no aortic valve stenosis.  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 Severe disease in a large diagonal. Eccentric lesion in an OM, best seen in the AP caudal view. Chronically occluded dominant RCA.   Hold Plavix.  Plan for cardiac surgery consult.  Patient Profile     78 y.o. male with a history of hypertension, hyperlipidemia, bradycardia, open AAA repair, bilateral carotid artery occlusion s/p endarterectomy, TIA x2 on Plavix, rheumatoid arthritis, CKD stage III-IVwho presented to [facility] for evaluation of chest pain. Patient underwent cardiac catheterization on 06/21/2018 which showed severe multivessel disease. CT surgery was consulted for possible CABG.  Assessment & Plan    1.  Chest pain with severe multivessel coronary artery disease: Currently denies anginal symptoms.  Cardiac catheterization results reviewed above.  CABG has been recommended after Plavix washout.  Surgery tentatively planned for 06/27/2018.  Continue aspirin, statin, and beta-blocker.  2.  Bradycardia: Heart rate currently in 50-60 Bpm range.  No pauses noted.  He remains on propranolol  40 mg twice daily.  No changes to therapy.  3.  Hypertension: Blood pressure is normal.  No changes to therapy.  4.  Hyperlipidemia: Lipid panel from 06/20/2018 reviewed above with LDL at goal, 55.  Continue atorvastatin.  4.  Prediabetes: A1c 6% this admission.  5.  Chronic kidney disease stage III/IV: Creatinine down to 1.23 yesterday from 2 on 06/22/2018.  I will repeat basic metabolic panel today.  6.  Anemia: Hemoglobin 9.6 yesterday from 10.3 on 06/22/2018.  I will repeat CBC today.      For questions or updates, please contact [place] Please consult [email or web address] for contact info under Cardiology/STEMI.      Signed, Kate Sable, MD  06/24/2018, 9:00 AM

## 2018-06-25 ENCOUNTER — Ambulatory Visit: Payer: Medicare HMO | Admitting: Sports Medicine

## 2018-06-25 DIAGNOSIS — I739 Peripheral vascular disease, unspecified: Secondary | ICD-10-CM

## 2018-06-25 DIAGNOSIS — E782 Mixed hyperlipidemia: Secondary | ICD-10-CM

## 2018-06-25 LAB — URINALYSIS, ROUTINE W REFLEX MICROSCOPIC
Bilirubin Urine: NEGATIVE
Glucose, UA: NEGATIVE mg/dL
Hgb urine dipstick: NEGATIVE
Ketones, ur: NEGATIVE mg/dL
Nitrite: POSITIVE — AB
PROTEIN: NEGATIVE mg/dL
Specific Gravity, Urine: 1.01 (ref 1.005–1.030)
pH: 5 (ref 5.0–8.0)

## 2018-06-25 LAB — BLOOD GAS, ARTERIAL
Acid-base deficit: 0 mmol/L (ref 0.0–2.0)
Bicarbonate: 23.7 mmol/L (ref 20.0–28.0)
Drawn by: 441371
FIO2: 21
O2 Saturation: 96.4 %
PO2 ART: 83.6 mmHg (ref 83.0–108.0)
Patient temperature: 98.6
pCO2 arterial: 36 mmHg (ref 32.0–48.0)
pH, Arterial: 7.434 (ref 7.350–7.450)

## 2018-06-25 MED ORDER — ISOSORBIDE MONONITRATE ER 30 MG PO TB24
30.0000 mg | ORAL_TABLET | Freq: Every day | ORAL | Status: DC
  Administered 2018-06-25 – 2018-06-26 (×2): 30 mg via ORAL
  Filled 2018-06-25 (×2): qty 1

## 2018-06-25 NOTE — Care Management Important Message (Signed)
Important Message  Patient Details  Name: ILAY CAPSHAW MRN: [id number] Date of Birth: April 12, 1941   Medicare Important Message Given:  Yes    Barb Merino [place] 06/25/2018, 12:14 PM

## 2018-06-25 NOTE — Progress Notes (Signed)
RN called into room. Pt c/o 2-4 out of 10 sharp lt chest pain non-radiating. ekg done & in chart. ekg showed sb, 1st degree hb & unchanged from previous ekg yesterday. Pt is scheduled for cabg Wednesday. Cardiologist on call notified. Pt given 1 sl nitro. Vs stable & in chart. Will continue to monitor the pt.Hoover Brunette, RN

## 2018-06-25 NOTE — Consult Note (Signed)
[facility] CM Primary Care Navigator  06/25/2018  Kevin [name] [birth year] [id number]    Met with patient and wife Kevin [name]) at the bedside to identify possible discharge needs.  Patient reportsthat he had "left sided chest discomfort"- described as an "ache" which had led to this admission and eventually surgery. (underwent left cardiac catheterization, noted with severe multivessel CAD- coronary artery disease and CT surgery consulted with recommendation for CABG- coronary artery bypass grafting after Plavix washout, planned for 06/27/18)  Patient endorsesDr.Stephen Yong [name] with [facility] [place] as hisprimary care provider.   Patient shared [place] [facility] [address] and [facility] Order Delivery service toobtain medications without any problem.   Patientstatesthathe has beenmanaginghis medications at home with use of "pill box" system filled every week.   Patientverbalizedthat he had been driving prior to admission/ surgery but wife will be able toprovide transportation to his doctors' appointmentsafter discharge.  Patientreportslivingwith wife who will serve as his primary caregiver at home.   Anticipated discharge plan ishomewith home health services and may do better with a hospital bed in the home per Inpatient CM note. Patient will also have cardiac rehabafter few weeks per patient's wife.  Patientand wife voiced understanding to call primary care provider's office for a post discharge follow-up appointment Kevin [name]- 2 weeks orsooner if needs arise. Patient letter (with PCP's contact number) wasprovided asa reminder.   Discussed with patientand wife regarding [facility] services available for health management andresourcesat home but both denied any current or pressing needs at this time. He states that his health conditions are well managed so far. Patient and wifeexpressed understanding of needto  seekreferral from primary care provider to [facility] care management ifdeemed necessary and appropriate for any services in the nearfuture.  [facility] care management information was provided for futureneeds thatpatientmay have.  Patienthowever, verbally agreedand optedfor EMMI calls to follow-up withhisrecovery at home after surgery.   Referral made for [facility] General calls after discharge.    For additional questions please contact:  Kevin [name], BSN, RN-BC [facility] PRIMARY CARE Navigator Cell: [phone]

## 2018-06-25 NOTE — Progress Notes (Signed)
CARDIAC REHAB PHASE I   PRE:  Rate/Rhythm: 51 SB  BP:  Supine:   Sitting: 151/65  Standing:    SaO2: 96%RA  MODE:  Ambulation: 470 ft   POST:  Rate/Rhythm: 66 SR  BP:  Supine:   Sitting: 149/60  Standing:    SaO2: 99%RA 0905-0942 Pt walked 470 ft with steady slow paced. Talked whole walk with a little SOB. Pt stated his SOB more from orthopedic issues. Sats good on RA and no CP. Pt stated he is using IS and getting to 2536ml.  Tolerated well.   Graylon Good, RN BSN  06/25/2018 9:37 AM

## 2018-06-25 NOTE — Care Management Note (Signed)
Case Management Note  Patient Details  Name: Kevin [name] MRN: [id number] Date of Birth: [birth year]  Subjective/Objective:  Pt presented for Chest Pain- Plavix washout and plan for CABG 06-27-18. PTA independent from home with wife. Pt uses a recliner in the home 2/2 shoulder issues. Wife had concerns that he will probably do better with a hospital bed in the home.                    Action/Plan: CM will continue to monitor for additional transition of care needs and DME.   Expected Discharge Date:                  Expected Discharge Plan:  [place]  In-House Referral:  NA  Discharge planning Services  CM Consult  Post Acute Care Choice:  Durable Medical Equipment Choice offered to:     DME Arranged:    DME Agency:     HH Arranged:    [place] Agency:     Status of Service:  In process, will continue to follow  If discussed at Long Length of Stay Meetings, dates discussed:    Additional Comments:  Bethena Roys, RN 06/25/2018, 12:43 PM

## 2018-06-25 NOTE — Progress Notes (Signed)
Progress Note  Patient Name: Kevin [name] Date of Encounter: 06/25/2018  Primary Cardiologist: Dorris Carnes, MD  Subjective   No pain since the 0500 episode.  But the episode on Sunday was the worst.  He walked in [place] this AM without pain.   Inpatient Medications    Scheduled Meds: . amLODipine  10 mg Oral Daily  . aspirin EC  81 mg Oral Daily  . atorvastatin  20 mg Oral Daily  . cholecalciferol  1,000 Units Oral Daily  . diazepam  2.5-5 mg Oral QHS  . enoxaparin (LOVENOX) injection  30 mg Subcutaneous Q24H  . fenofibrate  160 mg Oral Daily  . furosemide  20 mg Oral Q M,W,F  . gabapentin  300 mg Oral BID  . Melatonin  9 mg Oral QHS  . propranolol  40 mg Oral BID  . sodium chloride flush  3 mL Intravenous Q12H  . tamsulosin  0.4 mg Oral QHS  . tiZANidine  4 mg Oral QHS  . vitamin B-12  1,000 mcg Oral Daily   Continuous Infusions: . sodium chloride     PRN Meds: sodium chloride, acetaminophen, albuterol, diphenhydrAMINE, docusate sodium, morphine injection, nitroGLYCERIN, ondansetron (ZOFRAN) IV, oxyCODONE-acetaminophen, sodium chloride flush   Vital Signs    Vitals:   06/24/18 2030 06/25/18 0506 06/25/18 0536 06/25/18 0540  BP: (!) 144/68 (!) 107/51 133/62 (!) 102/51  Pulse: (!) 54 (!) 46    Resp: 16 13    Temp: 97.6 F (36.4 C) 98.1 F (36.7 C)    TempSrc: Axillary Oral    SpO2: 100% 97%    Weight:  78.6 kg    Height:        Intake/Output Summary (Last 24 hours) at 06/25/2018 0937 Last data filed at 06/25/2018 0000 Gross per 24 hour  Intake 360 ml  Output -  Net 360 ml   Last 3 Weights 06/25/2018 06/24/2018 06/23/2018  Weight (lbs) 173 lb 4.8 oz 174 lb 171 lb 12.8 oz  Weight (kg) 78.608 kg 78.926 kg 77.928 kg      Telemetry    SR  - Personally Reviewed  ECG    SB at 48 and no acute changes, does have 1st degree AV block - Personally Reviewed  Physical Exam   GEN: No acute distress.   Neck: No JVD Cardiac: RRR, no murmurs, rubs, or gallops.    Respiratory: Clear to auscultation bilaterally. GI: Soft, nontender, non-distended  MS: No edema; No deformity. Neuro:  Nonfocal  Psych: Normal affect   Labs    Chemistry Recent Labs  Lab 06/22/18 0427 06/23/18 0345 06/24/18 0948  NA 138 139 136  K 3.9 3.4* 4.2  CL 108 106 104  CO2 24 22 23   GLUCOSE 107* 94 133*  BUN 20 10 26*  CREATININE 2.00* 1.23 2.18*  CALCIUM 8.6* 8.9 8.9  GFRNONAA 31* 55* 28*  GFRAA 36* >60 32*  ANIONGAP 6 11 9      Hematology Recent Labs  Lab 06/22/18 0427 06/22/18 1450 06/23/18 0345 06/24/18 0948  WBC 5.5  --  6.3 6.9  RBC 3.31* 3.75* 3.55* 3.72*  HGB 10.3*  --  9.6* 11.1*  HCT 30.0*  --  31.1* 34.0*  MCV 90.6  --  87.6 91.4  MCH 31.1  --  27.0 29.8  MCHC 34.3  --  30.9 32.6  RDW 13.3  --  14.1 13.2  PLT 187  --  203 219    Cardiac Enzymes Recent Labs  Lab 06/19/18 1134 06/19/18 1524 06/19/18 1853  TROPONINI <0.03 <0.03 <0.03   No results for input(s): TROPIPOC in the last 168 hours.   BNPNo results for input(s): BNP, PROBNP in the last 168 hours.   DDimer No results for input(s): DDIMER in the last 168 hours.   Radiology    No results found.  Cardiac Studies   Echocardiogram 06/20/2018: Study Conclusions: - Left ventricle: The cavity size was normal. There was mild concentric hypertrophy. Systolic function was normal. The estimated ejection fraction was in the range of 60% to 65%. Wall motion was normal; there were no regional wall motion abnormalities. Doppler parameters are consistent with abnormal left ventricular relaxation (grade 1 diastolic dysfunction). Doppler parameters are consistent with high ventricular filling pressure. - Aortic valve: Transvalvular velocity was within the normal range. There was no stenosis. There was no regurgitation. Valve area (VTI): 2.17 cm^2. Valve area (Vmean): 2.45 cm^2. - Mitral valve: Mildly calcified annulus. Transvalvular velocity was within the normal  range. There was no evidence for stenosis. There was no regurgitation. - Left atrium: The atrium was mildly dilated. - Right ventricle: The cavity size was normal. Wall thickness was normal. Systolic function was normal. - Tricuspid valve: There was trivial regurgitation. - Pulmonary arteries: Systolic pressure was within the normal range. PA peak pressure: 24 mm Hg (S). _______________  Left Heart Catheterization 06/21/2018:  Prox RCA lesion is 100% stenosed. Left to right collaterals.  2nd Mrg lesion is 80% stenosed.  Ost 1st Diag to 1st Diag lesion is 90% stenosed.  Prox LAD lesion is 80% stenosed.  LV end diastolic pressure is normal.  There is no aortic valve stenosis.  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 Severe disease in a large diagonal. Eccentric lesion in an OM, best seen in the AP caudal view. Chronically occluded dominant RCA.   Hold Plavix.  Plan for cardiac surgery consult.  Patient Profile     78 y.o. male open AAA repair, HLD, RA, HTN, TIA in 2013, left CEA 2006, and bradycardia.  He had a right CEA as well.  CKD stage III-IV,  With plan for CABG with Plavix washout.  Currently scheduled for Wed.    Assessment & Plan    Unstable angina/Chest pain.  Severe multivessel Coronary disease. 100% stenosis of proximal RCA (left to right collaterals present), 80% of 2nd Mrg, 90% stenosis of Ost 1st Diag to 1st Diag, and 80% of proximal LAD. --he has had 2 episodes of chest pain, one yesterday and another early AM  This AM was sharp given NTG with relief and no acute EKG changes --yesterday pain relief with IV morphine.   --not on NTG will add Imdur - BP labile.  May need IV heparin, he has been anemic but with continued pain and stable Hgb this AM may need will defer to Dr. Debara Pickett.    CAD severe, for CABG on Wed.  Bradycardia on inderal 40 mg BID ? Need to decrease.  HTN labile but controlled  HLD on atorvastatin. Only  on 20 mg - his LDL was 55 but may need higher dose.  Anemia per IM   Chronic pain per IM          For questions or updates, please contact [place] Please consult [email or web address] for contact info under        Signed, Cecilie Kicks, NP  06/25/2018, 9:37 AM

## 2018-06-25 NOTE — Progress Notes (Signed)
Progress Note    Kevin [name]  VZD:[id number] DOB: February 25, 1941  DOA: 06/19/2018 PCP: Marin Olp, MD    Brief Narrative:     Medical records reviewed and are as summarized below:  Kevin [name] is an 78 y.o. 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 It occurs most days, is lower L-sided, described as an "ache." He says it occurs no more than once a day. It started about 3 weeks ago, pt does not believe it is becoming more frequent but wife states she sees him holding his chest more often the last 2-3 days.  S/p cath and plan is for CABG.  Assessment/Plan:   Unstable angina/multivessel CAD -Left heart catheterization noted severe multivessel CAD with 100% stenosis of proximal RCA, 80% second marginal, 90% for ostial first diagonal and 80% proximal LAD -Cardiology following, CT surgery consulted, recommended CABG after Plavix washout, planned for 1/29 -Continue aspirin, beta-blocker, statin -2 episodes of chest pain overnight, now started on Imdur  Stage IV kidney disease -Followed by Dr. Moshe Cipro -Creatinine stable at baseline of 2 -Stable post cath  Anemia of chronic disease -Due to chronic kidney disease, check iron panel -Hemoglobin slightly lower than baseline  Prediabetes -Hemoglobin A1c 6.0  Chronic pain:  -oxycodone at home.  Dyslipidemia:  -Lipid panel revealed total cholesterol 113, HDL 23, LDL 55, and triglycerides 177.  Patient's LDL appears less than 70, but HDL and triglycerides do not appear at target. -Continue Lipitor and fenofibrate  Essential hypertension -Continue amlodipine  Tremor -Propranolol Restarted  BPH -Continue Flomax  Prediabetes -Hemoglobin A1c 6   Family Communication/Anticipated D/C date and plan/Code Status   DVT prophylaxis: Lovenox  Code  Status: Full Code.  Family Communication: Wife at bedside Disposition Plan: Home pending CABG   Medical Consultants:    Cards  CVTS    Subjective:  Had 2 episodes of mild chest pain overnight, received nitroglycerin x3 doses  Objective:    Vitals:   06/25/18 0506 06/25/18 0536 06/25/18 0540 06/25/18 1153  BP: (!) 107/51 133/62 (!) 102/51 119/68  Pulse: (!) 46   (!) 59  Resp: 13   18  Temp: 98.1 F (36.7 C)   98.3 F (36.8 C)  TempSrc: Oral   Oral  SpO2: 97%   96%  Weight: 78.6 kg     Height:        Intake/Output Summary (Last 24 hours) at 06/25/2018 1343 Last data filed at 06/25/2018 0000 Gross per 24 hour  Intake 360 ml  Output -  Net 360 ml   Filed Weights   06/23/18 0415 06/24/18 0456 06/25/18 0506  Weight: 77.9 kg 78.9 kg 78.6 kg   Gen: Awake, Alert, Oriented X 3,  HEENT: PERRLA, Neck supple, no JVD Lungs: Good air movement bilaterally, CTAB CVS: RRR,No Gallops,Rubs or new Murmurs Abd: soft, Non tender, non distended, BS present Extremities: No edema Skin: no new rashes  Data Reviewed:   I have personally reviewed following labs and imaging studies:  Labs: Labs show the following:   Basic Metabolic Panel: Recent Labs  Lab 06/20/18 0732 06/21/18 0258 06/22/18 0427 06/23/18 0345 06/24/18 0948  NA 137 137 138 139 136  K 3.7 3.9 3.9 3.4* 4.2  CL 106 106 108 106 104  CO2 23 24 24 22 23   GLUCOSE 113* 112*  107* 94 133*  BUN 25* 26* 20 10 26*  CREATININE 2.05* 2.04* 2.00* 1.23 2.18*  CALCIUM 8.6* 8.3* 8.6* 8.9 8.9   GFR Estimated Creatinine Clearance: 25.7 mL/min (A) (by C-G formula based on SCr of 2.18 mg/dL (H)). Liver Function Tests: No results for input(s): AST, ALT, ALKPHOS, BILITOT, PROT, ALBUMIN in the last 168 hours. No results for input(s): LIPASE, AMYLASE in the last 168 hours. No results for input(s): AMMONIA in the last 168 hours. Coagulation profile No results for input(s): INR, PROTIME in the last 168 hours.  CBC: Recent  Labs  Lab 06/19/18 1134  06/20/18 0732 06/21/18 0258 06/22/18 0427 06/23/18 0345 06/24/18 0948  WBC 6.3  --  5.8 6.4 5.5 6.3 6.9  NEUTROABS 3.0  --  2.7 2.8  --   --   --   HGB 10.8*  --  10.7* 10.1* 10.3* 9.6* 11.1*  HCT 33.7*   < > 32.5* 30.5* 30.0* 31.1* 34.0*  MCV 92.6  --  90.0 90.2 90.6 87.6 91.4  PLT 214  --  200 190 187 203 219   < > = values in this interval not displayed.   Cardiac Enzymes: Recent Labs  Lab 06/19/18 1134 06/19/18 1524 06/19/18 1853  TROPONINI <0.03 <0.03 <0.03   BNP (last 3 results) No results for input(s): PROBNP in the last 8760 hours. CBG: No results for input(s): GLUCAP in the last 168 hours. D-Dimer: No results for input(s): DDIMER in the last 72 hours. Hgb A1c: No results for input(s): HGBA1C in the last 72 hours. Lipid Profile: No results for input(s): CHOL, HDL, LDLCALC, TRIG, CHOLHDL, LDLDIRECT in the last 72 hours. Thyroid function studies: No results for input(s): TSH, T4TOTAL, T3FREE, THYROIDAB in the last 72 hours.  Invalid input(s): FREET3 Anemia work up: Recent Labs    06/22/18 1450  VITAMINB12 1,048*  FOLATE 35.0  FERRITIN 319  TIBC 368  IRON 100  RETICCTPCT 1.4   Sepsis Labs: Recent Labs  Lab 06/21/18 0258 06/22/18 0427 06/23/18 0345 06/24/18 0948  WBC 6.4 5.5 6.3 6.9    Microbiology Recent Results (from the past 240 hour(s))  Surgical PCR screen     Status: None   Collection Time: 06/22/18 12:08 AM  Result Value Ref Range Status   MRSA, PCR NEGATIVE NEGATIVE Final   Staphylococcus aureus NEGATIVE NEGATIVE Final    Comment: (NOTE) The Xpert SA Assay (FDA approved for NASAL specimens in patients 43 years of age and older), is one component of a comprehensive surveillance program. It is not intended to diagnose infection nor to guide or monitor treatment. Performed at [facility], [place] [address], [place], [place] [address]     Procedures and diagnostic studies:  No results  found.  Medications:   . amLODipine  10 mg Oral Daily  . aspirin EC  81 mg Oral Daily  . atorvastatin  20 mg Oral Daily  . cholecalciferol  1,000 Units Oral Daily  . diazepam  2.5-5 mg Oral QHS  . enoxaparin (LOVENOX) injection  30 mg Subcutaneous Q24H  . fenofibrate  160 mg Oral Daily  . furosemide  20 mg Oral Q M,W,F  . gabapentin  300 mg Oral BID  . isosorbide mononitrate  30 mg Oral Daily  . Melatonin  9 mg Oral QHS  . propranolol  40 mg Oral BID  . sodium chloride flush  3 mL Intravenous Q12H  . tamsulosin  0.4 mg Oral QHS  . tiZANidine  4 mg  Oral QHS  . vitamin B-12  1,000 mcg Oral Daily   Continuous Infusions: . sodium chloride       LOS: 4 days   Domenic Polite MD [facility]  06/25/2018, 1:43 PM

## 2018-06-25 NOTE — Progress Notes (Signed)
      [address]       [place] [address]             [phone]      No complaints  BP 119/68 (BP Location: Right Arm)   Pulse (!) 59   Temp 98.3 F (36.8 C) (Oral)   Resp 18   Ht 5\' 7"  (1.702 m)   Wt 78.6 kg   SpO2 96%   BMI 27.14 kg/m   Intake/Output Summary (Last 24 hours) at 06/25/2018 1513 Last data filed at 06/25/2018 0000 Gross per 24 hour  Intake 360 ml  Output -  Net 360 ml   Exam unchanged  ABIs c/w with moderate PAD bilaterally Carotids OK  For CABG Wednesday  Remo Lipps C. Roxan Hockey, MD [facility] [phone]

## 2018-06-26 ENCOUNTER — Inpatient Hospital Stay (HOSPITAL_COMMUNITY): Payer: Medicare HMO

## 2018-06-26 DIAGNOSIS — I2510 Atherosclerotic heart disease of native coronary artery without angina pectoris: Secondary | ICD-10-CM

## 2018-06-26 LAB — PROTIME-INR
INR: 1.2
Prothrombin Time: 15.1 seconds (ref 11.4–15.2)

## 2018-06-26 LAB — PULMONARY FUNCTION TEST
DL/VA % pred: 67 %
DL/VA: 2.95 ml/min/mmHg/L
DLCO cor % pred: 57 %
DLCO cor: 16.19 ml/min/mmHg
DLCO unc % pred: 50 %
DLCO unc: 14.33 ml/min/mmHg
FEF 25-75 Post: 1.82 L/sec
FEF 25-75 Pre: 1.47 L/sec
FEF2575-%Change-Post: 23 %
FEF2575-%Pred-Post: 104 %
FEF2575-%Pred-Pre: 84 %
FEV1-%Change-Post: 4 %
FEV1-%PRED-POST: 102 %
FEV1-%PRED-PRE: 97 %
FEV1-Post: 2.6 L
FEV1-Pre: 2.48 L
FEV1FVC-%Change-Post: 4 %
FEV1FVC-%Pred-Pre: 99 %
FEV6-%Change-Post: 1 %
FEV6-%Pred-Post: 103 %
FEV6-%Pred-Pre: 101 %
FEV6-POST: 3.45 L
FEV6-Pre: 3.39 L
FEV6FVC-%Change-Post: 0 %
FEV6FVC-%Pred-Post: 105 %
FEV6FVC-%Pred-Pre: 104 %
FVC-%CHANGE-POST: 0 %
FVC-%Pred-Post: 98 %
FVC-%Pred-Pre: 97 %
FVC-Post: 3.51 L
FVC-Pre: 3.48 L
Post FEV1/FVC ratio: 74 %
Post FEV6/FVC ratio: 98 %
Pre FEV1/FVC ratio: 71 %
Pre FEV6/FVC Ratio: 97 %
RV % pred: 94 %
RV: 2.33 L
TLC % pred: 91 %
TLC: 5.9 L

## 2018-06-26 LAB — COMPREHENSIVE METABOLIC PANEL
ALT: 16 U/L (ref 0–44)
AST: 23 U/L (ref 15–41)
Albumin: 3.3 g/dL — ABNORMAL LOW (ref 3.5–5.0)
Alkaline Phosphatase: 26 U/L — ABNORMAL LOW (ref 38–126)
Anion gap: 9 (ref 5–15)
BUN: 33 mg/dL — ABNORMAL HIGH (ref 8–23)
CO2: 25 mmol/L (ref 22–32)
Calcium: 8.6 mg/dL — ABNORMAL LOW (ref 8.9–10.3)
Chloride: 102 mmol/L (ref 98–111)
Creatinine, Ser: 2.56 mg/dL — ABNORMAL HIGH (ref 0.61–1.24)
GFR calc Af Amer: 27 mL/min — ABNORMAL LOW (ref >60)
GFR calc non Af Amer: 23 mL/min — ABNORMAL LOW (ref >60)
GLUCOSE: 132 mg/dL — AB (ref 70–99)
Potassium: 4 mmol/L (ref 3.5–5.1)
Sodium: 136 mmol/L (ref 135–145)
Total Bilirubin: 0.5 mg/dL (ref 0.3–1.2)
Total Protein: 5.6 g/dL — ABNORMAL LOW (ref 6.5–8.1)

## 2018-06-26 LAB — APTT: aPTT: 39 seconds — ABNORMAL HIGH (ref 24–36)

## 2018-06-26 MED ORDER — MILRINONE LACTATE IN DEXTROSE 20-5 MG/100ML-% IV SOLN
0.3000 ug/kg/min | INTRAVENOUS | Status: DC
  Filled 2018-06-26: qty 100

## 2018-06-26 MED ORDER — DOPAMINE-DEXTROSE 3.2-5 MG/ML-% IV SOLN
0.0000 ug/kg/min | INTRAVENOUS | Status: DC
  Filled 2018-06-26: qty 250

## 2018-06-26 MED ORDER — METOPROLOL TARTRATE 12.5 MG HALF TABLET: 12.5000 mg | ORAL_TABLET | Freq: Once | ORAL | Status: DC

## 2018-06-26 MED ORDER — SODIUM CHLORIDE 0.9 % IV SOLN
1.5000 g | INTRAVENOUS | Status: AC
  Administered 2018-06-27: 1.5 g via INTRAVENOUS
  Administered 2018-06-27: .75 g via INTRAVENOUS
  Filled 2018-06-26: qty 1.5

## 2018-06-26 MED ORDER — NITROGLYCERIN IN D5W 200-5 MCG/ML-% IV SOLN
2.0000 ug/min | INTRAVENOUS | Status: AC
  Administered 2018-06-27: 10 ug/min via INTRAVENOUS
  Filled 2018-06-26: qty 250

## 2018-06-26 MED ORDER — BISACODYL 5 MG PO TBEC
5.0000 mg | DELAYED_RELEASE_TABLET | Freq: Once | ORAL | Status: AC
  Administered 2018-06-26: 5 mg via ORAL
  Filled 2018-06-26: qty 1

## 2018-06-26 MED ORDER — SODIUM CHLORIDE 0.9 % IV SOLN
750.0000 mg | INTRAVENOUS | Status: DC
  Filled 2018-06-26: qty 750

## 2018-06-26 MED ORDER — TRANEXAMIC ACID (OHS) BOLUS VIA INFUSION
15.0000 mg/kg | INTRAVENOUS | Status: DC
  Filled 2018-06-26: qty 1193

## 2018-06-26 MED ORDER — MAGNESIUM SULFATE 50 % IJ SOLN
40.0000 meq | INTRAMUSCULAR | Status: DC
  Filled 2018-06-26: qty 9.85

## 2018-06-26 MED ORDER — PHENYLEPHRINE HCL-NACL 20-0.9 MG/250ML-% IV SOLN
30.0000 ug/min | INTRAVENOUS | Status: AC
  Administered 2018-06-27: 30 ug/min via INTRAVENOUS
  Filled 2018-06-26: qty 250

## 2018-06-26 MED ORDER — DEXMEDETOMIDINE HCL IN NACL 400 MCG/100ML IV SOLN
0.1000 ug/kg/h | INTRAVENOUS | Status: AC
  Administered 2018-06-27: .2 ug/kg/h via INTRAVENOUS
  Filled 2018-06-26: qty 100

## 2018-06-26 MED ORDER — SODIUM CHLORIDE 0.9 % IV SOLN
INTRAVENOUS | Status: DC
  Filled 2018-06-26: qty 30

## 2018-06-26 MED ORDER — CHLORHEXIDINE GLUCONATE CLOTH 2 % EX PADS
6.0000 | MEDICATED_PAD | Freq: Once | CUTANEOUS | Status: AC
  Administered 2018-06-26: 6 via TOPICAL

## 2018-06-26 MED ORDER — NOREPINEPHRINE-SODIUM CHLORIDE 4-0.9 MG/250ML-% IV SOLN
0.0000 ug/min | INTRAVENOUS | Status: DC
  Filled 2018-06-26: qty 250

## 2018-06-26 MED ORDER — POTASSIUM CHLORIDE 2 MEQ/ML IV SOLN
80.0000 meq | INTRAVENOUS | Status: DC
  Filled 2018-06-26: qty 40

## 2018-06-26 MED ORDER — EPINEPHRINE PF 1 MG/ML IJ SOLN
0.0000 ug/min | INTRAVENOUS | Status: DC
  Filled 2018-06-26: qty 4

## 2018-06-26 MED ORDER — INSULIN REGULAR(HUMAN) IN NACL 100-0.9 UT/100ML-% IV SOLN
INTRAVENOUS | Status: AC
  Administered 2018-06-27: .9 [IU]/h via INTRAVENOUS
  Filled 2018-06-26: qty 100

## 2018-06-26 MED ORDER — PROPRANOLOL HCL 20 MG PO TABS
20.0000 mg | ORAL_TABLET | Freq: Two times a day (BID) | ORAL | Status: DC
  Administered 2018-06-26: 20 mg via ORAL
  Filled 2018-06-26: qty 1

## 2018-06-26 MED ORDER — TRANEXAMIC ACID (OHS) PUMP PRIME SOLUTION
2.0000 mg/kg | INTRAVENOUS | Status: DC
  Filled 2018-06-26: qty 1.59

## 2018-06-26 MED ORDER — PLASMA-LYTE 148 IV SOLN
INTRAVENOUS | Status: AC
  Administered 2018-06-27: 500 mL
  Filled 2018-06-26: qty 2.5

## 2018-06-26 MED ORDER — VANCOMYCIN HCL 10 G IV SOLR
1250.0000 mg | INTRAVENOUS | Status: AC
  Administered 2018-06-27: 1250 mg via INTRAVENOUS
  Filled 2018-06-26: qty 1250

## 2018-06-26 MED ORDER — TRANEXAMIC ACID 1000 MG/10ML IV SOLN
1.5000 mg/kg/h | INTRAVENOUS | Status: AC
  Administered 2018-06-27: 15 mg/kg/h via INTRAVENOUS
  Filled 2018-06-26: qty 25

## 2018-06-26 MED ORDER — CHLORHEXIDINE GLUCONATE 0.12 % MT SOLN
15.0000 mL | Freq: Once | OROMUCOSAL | Status: AC
  Administered 2018-06-27: 15 mL via OROMUCOSAL
  Filled 2018-06-26: qty 15

## 2018-06-26 MED ORDER — TEMAZEPAM 7.5 MG PO CAPS: 15.0000 mg | ORAL_CAPSULE | Freq: Once | ORAL | Status: DC | PRN

## 2018-06-26 MED ORDER — CHLORHEXIDINE GLUCONATE CLOTH 2 % EX PADS
6.0000 | MEDICATED_PAD | Freq: Once | CUTANEOUS | Status: AC
  Administered 2018-06-27: 6 via TOPICAL

## 2018-06-26 MED ORDER — ALBUTEROL SULFATE (2.5 MG/3ML) 0.083% IN NEBU
2.5000 mg | INHALATION_SOLUTION | Freq: Once | RESPIRATORY_TRACT | Status: AC
  Administered 2018-06-26: 2.5 mg via RESPIRATORY_TRACT

## 2018-06-26 NOTE — Progress Notes (Signed)
Progress Note    Kevin [name]  [id number]:[id number] DOB: 1941/02/08  DOA: 06/19/2018 PCP: Marin Olp, MD    Brief Narrative:     Medical records reviewed and are as summarized below:  Kevin [name] is an 78 y.o. 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 It occurs most days, is lower L-sided, described as an "ache." He says it occurs no more than once a day. It started about 3 weeks ago, pt does not believe it is becoming more frequent but wife states she sees him holding his chest more often the last 2-3 days.  S/p cath and plan is for CABG.  Assessment/Plan:   Unstable angina/multivessel CAD -Left heart catheterization noted severe multivessel CAD with 100% stenosis of proximal RCA, 80% second marginal, 90% for ostial first diagonal and 80% proximal LAD -Cardiology following, CT surgery consulted, recommended CABG after Plavix washout, planned for 1/29 -Continue aspirin, beta-blocker, statin, Imdur -Stable awaiting CABG  Stage IV kidney disease -Followed by Dr. Moshe Cipro -Creatinine trended up to 2.5 today -Hold Lasix  Anemia of chronic disease -Due to chronic kidney disease, check iron panel -Hemoglobin slightly lower than baseline  Prediabetes -Hemoglobin A1c 6.0  Chronic pain:  -oxycodone at home.  Dyslipidemia:  -Lipid panel revealed total cholesterol 113, HDL 23, LDL 55, and triglycerides 177.  Patient's LDL appears less than 70, but HDL and triglycerides do not appear at target. -Continue Lipitor and fenofibrate  Essential hypertension -Continue amlodipine  Tremor -Propranolol Restarted  BPH -Continue Flomax  Prediabetes -Hemoglobin A1c 6   Family Communication/Anticipated D/C date and plan/Code Status   DVT prophylaxis: Lovenox  Code Status: Full Code.  Family  Communication: Wife at bedside Disposition Plan: Home pending CABG   Medical Consultants:    Cards  CVTS    Subjective:  -no Further complaints and no more episodes of chest pain  Objective:    Vitals:   06/25/18 2117 06/26/18 0619 06/26/18 1000 06/26/18 1432  BP:  (!) 126/59 130/62 118/88  Pulse: 60 (!) 50 (!) 55 62  Resp:  18 16 17   Temp:  97.6 F (36.4 C) 97.6 F (36.4 C) (!) 97.4 F (36.3 C)  TempSrc:  Oral Oral Oral  SpO2:  96% 100% 100%  Weight:  78.8 kg 80.8 kg 80.8 kg  Height:        Intake/Output Summary (Last 24 hours) at 06/26/2018 1547 Last data filed at 06/26/2018 1400 Gross per 24 hour  Intake 1406 ml  Output 500 ml  Net 906 ml   Filed Weights   06/26/18 0619 06/26/18 1000 06/26/18 1432  Weight: 78.8 kg 80.8 kg 80.8 kg   Gen: Awake, Alert, Oriented X 3,  HEENT: PERRLA, Neck supple, no JVD Lungs: Good air movement bilaterally, CTAB CVS: RRR,No Gallops,Rubs or new Murmurs Abd: soft, Non tender, non distended, BS present Extremities: No edema Skin: no new rashes   Data Reviewed:   I have personally reviewed following labs and imaging studies:  Labs: Labs show the following:   Basic Metabolic Panel: Recent Labs  Lab 06/21/18 0258 06/22/18 0427 06/23/18 0345 06/24/18 0948 06/26/18 0326  NA 137 138 139 136 136  K 3.9 3.9 3.4* 4.2 4.0  CL 106 108 106 104 102  CO2 24 24 22 23 25   GLUCOSE 112* 107* 94 133*  132*  BUN 26* 20 10 26* 33*  CREATININE 2.04* 2.00* 1.23 2.18* 2.56*  CALCIUM 8.3* 8.6* 8.9 8.9 8.6*   GFR Estimated Creatinine Clearance: 23.8 mL/min (A) (by C-G formula based on SCr of 2.56 mg/dL (H)). Liver Function Tests: Recent Labs  Lab 06/26/18 0326  AST 23  ALT 16  ALKPHOS 26*  BILITOT 0.5  PROT 5.6*  ALBUMIN 3.3*   No results for input(s): LIPASE, AMYLASE in the last 168 hours. No results for input(s): AMMONIA in the last 168 hours. Coagulation profile Recent Labs  Lab 06/26/18 0326  INR 1.20     CBC: Recent Labs  Lab 06/20/18 0732 06/21/18 0258 06/22/18 0427 06/23/18 0345 06/24/18 0948  WBC 5.8 6.4 5.5 6.3 6.9  NEUTROABS 2.7 2.8  --   --   --   HGB 10.7* 10.1* 10.3* 9.6* 11.1*  HCT 32.5* 30.5* 30.0* 31.1* 34.0*  MCV 90.0 90.2 90.6 87.6 91.4  PLT 200 190 187 203 219   Cardiac Enzymes: Recent Labs  Lab 06/19/18 1853  TROPONINI <0.03   BNP (last 3 results) No results for input(s): PROBNP in the last 8760 hours. CBG: No results for input(s): GLUCAP in the last 168 hours. D-Dimer: No results for input(s): DDIMER in the last 72 hours. Hgb A1c: No results for input(s): HGBA1C in the last 72 hours. Lipid Profile: No results for input(s): CHOL, HDL, LDLCALC, TRIG, CHOLHDL, LDLDIRECT in the last 72 hours. Thyroid function studies: No results for input(s): TSH, T4TOTAL, T3FREE, THYROIDAB in the last 72 hours.  Invalid input(s): FREET3 Anemia work up: No results for input(s): VITAMINB12, FOLATE, FERRITIN, TIBC, IRON, RETICCTPCT in the last 72 hours. Sepsis Labs: Recent Labs  Lab 06/21/18 0258 06/22/18 0427 06/23/18 0345 06/24/18 0948  WBC 6.4 5.5 6.3 6.9    Microbiology Recent Results (from the past 240 hour(s))  Surgical PCR screen     Status: None   Collection Time: 06/22/18 12:08 AM  Result Value Ref Range Status   MRSA, PCR NEGATIVE NEGATIVE Final   Staphylococcus aureus NEGATIVE NEGATIVE Final    Comment: (NOTE) The Xpert SA Assay (FDA approved for NASAL specimens in patients 51 years of age and older), is one component of a comprehensive surveillance program. It is not intended to diagnose infection nor to guide or monitor treatment. Performed at [facility], [place] [address], [place], [place] [address]     Procedures and diagnostic studies:  No results found.  Medications:   . amLODipine  10 mg Oral Daily  . aspirin EC  81 mg Oral Daily  . atorvastatin  20 mg Oral Daily  . cholecalciferol  1,000 Units Oral Daily  . diazepam   2.5-5 mg Oral QHS  . enoxaparin (LOVENOX) injection  30 mg Subcutaneous Q24H  . fenofibrate  160 mg Oral Daily  . furosemide  20 mg Oral Q M,W,F  . gabapentin  300 mg Oral BID  . [START ON 06/27/2018] heparin-papaverine-plasmalyte irrigation   Irrigation To OR  . [START ON 06/27/2018] insulin   Intravenous To OR  . isosorbide mononitrate  30 mg Oral Daily  . [START ON 06/27/2018] magnesium sulfate  40 mEq Other To OR  . Melatonin  9 mg Oral QHS  . [START ON 06/27/2018] metoprolol tartrate  12.5 mg Oral Once  . [START ON 06/27/2018] norepinephrine  0-40 mcg/min Intravenous To OR  . [START ON 06/27/2018] phenylephrine  30-200 mcg/min Intravenous To OR  . [START ON 06/27/2018] potassium chloride  80 mEq Other To OR  . sodium chloride flush  3 mL Intravenous Q12H  . tamsulosin  0.4 mg Oral QHS  . tiZANidine  4 mg Oral QHS  . [START ON 06/27/2018] tranexamic acid  15 mg/kg Intravenous To OR  . [START ON 06/27/2018] tranexamic acid  2 mg/kg Intracatheter To OR  . vitamin B-12  1,000 mcg Oral Daily   Continuous Infusions: . sodium chloride    . [START ON 06/27/2018] cefUROXime (ZINACEF)  IV    . [START ON 06/27/2018] cefUROXime (ZINACEF)  IV    . [START ON 06/27/2018] dexmedetomidine    . [START ON 06/27/2018] DOPamine    . [START ON 06/27/2018] epinephrine    . [START ON 06/27/2018] heparin 30,000 units/NS 1000 mL solution for CELLSAVER    . [START ON 06/27/2018] milrinone    . [START ON 06/27/2018] nitroGLYCERIN    . [START ON 06/27/2018] tranexamic acid (CYKLOKAPRON) infusion ([facility])    . [START ON 06/27/2018] vancomycin       LOS: 5 days   Domenic Polite MD [facility]  06/26/2018, 3:47 PM

## 2018-06-26 NOTE — Progress Notes (Signed)
5 Days Post-Op Procedure(s) (LRB): LEFT HEART CATH AND CORONARY ANGIOGRAPHY (N/A) Subjective: No complaints, denies CP  Objective: Vital signs in last 24 hours: Temp:  [97.4 F (36.3 C)-98.1 F (36.7 C)] 97.5 F (36.4 C) (01/28 1634) Pulse Rate:  [50-63] 63 (01/28 1634) Cardiac Rhythm: Sinus bradycardia (01/28 1432) Resp:  [16-18] 18 (01/28 1634) BP: (118-138)/(59-88) 138/78 (01/28 1634) SpO2:  [96 %-100 %] 98 % (01/28 1634) Weight:  [78.8 kg-80.8 kg] 80.8 kg (01/28 1634)  Hemodynamic parameters for last 24 hours:    Intake/Output from previous day: 01/27 0701 - 01/28 0700 In: 1406 [P.O.:1400; I.V.:6] Out: 1250 [Urine:1250] Intake/Output this shift: Total I/O In: 840 [P.O.:840] Out: -   exam unchanged  Lab Results: Recent Labs    06/24/18 0948  WBC 6.9  HGB 11.1*  HCT 34.0*  PLT 219   BMET:  Recent Labs    06/24/18 0948 06/26/18 0326  NA 136 136  K 4.2 4.0  CL 104 102  CO2 23 25  GLUCOSE 133* 132*  BUN 26* 33*  CREATININE 2.18* 2.56*  CALCIUM 8.9 8.6*    PT/INR:  Recent Labs    06/26/18 0326  LABPROT 15.1  INR 1.20   ABG    Component Value Date/Time   PHART 7.434 06/25/2018 1750   HCO3 23.7 06/25/2018 1750   TCO2 29 04/08/2009 0738   ACIDBASEDEF 0.0 06/25/2018 1750   O2SAT 96.4 06/25/2018 1750   CBG (last 3)  No results for input(s): GLUCAP in the last 72 hours.  Assessment/Plan: S/P Procedure(s) (LRB): LEFT HEART CATH AND CORONARY ANGIOGRAPHY (N/A)  For CABG in AM All questions answered PFTs show moderate impairment of diffusion capacity   LOS: 5 days    Melrose Nakayama 06/26/2018

## 2018-06-26 NOTE — Anesthesia Preprocedure Evaluation (Addendum)
Anesthesia Evaluation  Patient identified by MRN, date of birth, ID band Patient awake    Reviewed: Allergy & Precautions, NPO status , Patient's Chart, lab work & pertinent test results  Airway Mallampati: II  TM Distance: >3 FB     Dental   Pulmonary shortness of breath, pneumonia, former smoker,    breath sounds clear to auscultation       Cardiovascular hypertension, + angina + CAD, + Peripheral Vascular Disease and +CHF   Rhythm:Regular Rate:Normal     Neuro/Psych    GI/Hepatic negative GI ROS, Neg liver ROS,   Endo/Other    Renal/GU Renal disease     Musculoskeletal   Abdominal   Peds  Hematology  (+) anemia ,   Anesthesia Other Findings   Reproductive/Obstetrics                            Anesthesia Physical Anesthesia Plan  ASA: IV  Anesthesia Plan: General   Post-op Pain Management:    Induction: Intravenous  PONV Risk Score and Plan: Treatment may vary due to age or medical condition  Airway Management Planned: Oral ETT  Additional Equipment: Arterial line, TEE, PA Cath and Ultrasound Guidance Line Placement  Intra-op Plan:   Post-operative Plan: Post-operative intubation/ventilation  Informed Consent: I have reviewed the patients History and Physical, chart, labs and discussed the procedure including the risks, benefits and alternatives for the proposed anesthesia with the patient or authorized representative who has indicated his/her understanding and acceptance.     Dental advisory given  Plan Discussed with: Anesthesiologist and CRNA  Anesthesia Plan Comments:        Anesthesia Quick Evaluation

## 2018-06-26 NOTE — Progress Notes (Signed)
Progress Note  Patient Name: Kevin [name] Date of Encounter: 06/26/2018  Primary Cardiologist: Dorris Carnes, MD  Subjective   No significant overnight events. No further chest pain. No shortness of breath.  Patient is scheduled for CABG tomorrow.  Inpatient Medications    Scheduled Meds: . amLODipine  10 mg Oral Daily  . aspirin EC  81 mg Oral Daily  . atorvastatin  20 mg Oral Daily  . bisacodyl  5 mg Oral Once  . cholecalciferol  1,000 Units Oral Daily  . diazepam  2.5-5 mg Oral QHS  . enoxaparin (LOVENOX) injection  30 mg Subcutaneous Q24H  . fenofibrate  160 mg Oral Daily  . furosemide  20 mg Oral Q M,W,F  . gabapentin  300 mg Oral BID  . isosorbide mononitrate  30 mg Oral Daily  . Melatonin  9 mg Oral QHS  . [START ON 06/27/2018] metoprolol tartrate  12.5 mg Oral Once  . propranolol  20 mg Oral BID  . sodium chloride flush  3 mL Intravenous Q12H  . tamsulosin  0.4 mg Oral QHS  . tiZANidine  4 mg Oral QHS  . vitamin B-12  1,000 mcg Oral Daily   Continuous Infusions: . sodium chloride     PRN Meds: sodium chloride, acetaminophen, albuterol, diphenhydrAMINE, docusate sodium, morphine injection, nitroGLYCERIN, ondansetron (ZOFRAN) IV, oxyCODONE-acetaminophen, sodium chloride flush, temazepam   Vital Signs    Vitals:   06/25/18 1153 06/25/18 2034 06/25/18 2117 06/26/18 0619  BP: 119/68 138/74  (!) 126/59  Pulse: (!) 59 (!) 57 60 (!) 50  Resp: 18 18  18   Temp: 98.3 F (36.8 C) 98.1 F (36.7 C)  97.6 F (36.4 C)  TempSrc: Oral Oral  Oral  SpO2: 96% 97%  96%  Weight:    78.8 kg  Height:        Intake/Output Summary (Last 24 hours) at 06/26/2018 1610 Last data filed at 06/25/2018 2125 Gross per 24 hour  Intake 1046 ml  Output 950 ml  Net 96 ml   Last 3 Weights 06/26/2018 06/25/2018 06/24/2018  Weight (lbs) 173 lb 12.8 oz 173 lb 4.8 oz 174 lb  Weight (kg) 78.835 kg 78.608 kg 78.926 kg      Telemetry    Sinus rhythm with heart rates in the high 40's to 60's  with 1st degree AV block. - Personally Reviewed  ECG    No new ECG tracings. - Personally Reviewed  Physical Exam   RUE:[id number] male resting comfortably. Alert and in no acute distress.   Neck:Supple. Cardiac:Mildly bradycardic with regular rhythm. Nosignificantmurmurs, gallops, or rubs. Respiratory:Clear to auscultation bilaterally. No wheezes, rhonchi, or rales. JY:NWGNFAO soft, non-tender, non-distended.  MS:No lower extremity edema. No deformity. Skin: Warm and dry. Neuro:No focal deficits. Psych: Normal affect.  Labs    Chemistry Recent Labs  Lab 06/23/18 0345 06/24/18 0948 06/26/18 0326  NA 139 136 136  K 3.4* 4.2 4.0  CL 106 104 102  CO2 22 23 25   GLUCOSE 94 133* 132*  BUN 10 26* 33*  CREATININE 1.23 2.18* 2.56*  CALCIUM 8.9 8.9 8.6*  PROT  --   --  5.6*  ALBUMIN  --   --  3.3*  AST  --   --  23  ALT  --   --  16  ALKPHOS  --   --  26*  BILITOT  --   --  0.5  GFRNONAA 55* 28* 23*  GFRAA >60 32* 27*  ANIONGAP 11  9 9     Hematology Recent Labs  Lab 06/22/18 0427 06/22/18 1450 06/23/18 0345 06/24/18 0948  WBC 5.5  --  6.3 6.9  RBC 3.31* 3.75* 3.55* 3.72*  HGB 10.3*  --  9.6* 11.1*  HCT 30.0*  --  31.1* 34.0*  MCV 90.6  --  87.6 91.4  MCH 31.1  --  27.0 29.8  MCHC 34.3  --  30.9 32.6  RDW 13.3  --  14.1 13.2  PLT 187  --  203 219    Cardiac Enzymes Recent Labs  Lab 06/19/18 1134 06/19/18 1524 06/19/18 1853  TROPONINI <0.03 <0.03 <0.03   No results for input(s): TROPIPOC in the last 168 hours.   BNPNo results for input(s): BNP, PROBNP in the last 168 hours.   DDimer No results for input(s): DDIMER in the last 168 hours.   Radiology    No results found.  Cardiac Studies   Echocardiogram 06/20/2018: Study Conclusions: - Left ventricle: The cavity size was normal. There was mild concentric hypertrophy. Systolic function was normal. The estimated ejection fraction was in the range of 60% to 65%. Wall motion was  normal; there were no regional wall motion abnormalities. Doppler parameters are consistent with abnormal left ventricular relaxation (grade 1 diastolic dysfunction). Doppler parameters are consistent with high ventricular filling pressure. - Aortic valve: Transvalvular velocity was within the normal range. There was no stenosis. There was no regurgitation. Valve area (VTI): 2.17 cm^2. Valve area (Vmean): 2.45 cm^2. - Mitral valve: Mildly calcified annulus. Transvalvular velocity was within the normal range. There was no evidence for stenosis. There was no regurgitation. - Left atrium: The atrium was mildly dilated. - Right ventricle: The cavity size was normal. Wall thickness was normal. Systolic function was normal. - Tricuspid valve: There was trivial regurgitation. - Pulmonary arteries: Systolic pressure was within the normal range. PA peak pressure: 24 mm Hg (S). _______________  Left Heart Catheterization 06/21/2018:  Prox RCA lesion is 100% stenosed. Left to right collaterals.  2nd Mrg lesion is 80% stenosed.  Ost 1st Diag to 1st Diag lesion is 90% stenosed.  Prox LAD lesion is 80% stenosed.  LV end diastolic pressure is normal.  There is no aortic valve stenosis.  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 Severe disease in a large diagonal. Eccentric lesion in an OM, best seen in the AP caudal view. Chronically occluded dominant RCA.   Hold Plavix.  Plan for cardiac surgery consult.   Patient Profile     Kevin [name] is a 78 y.o. male with a history of hypertension, hyperlipidemia, bradycardia, open AAA repair, bilateral carotid artery occlusion s/p endarterectomy, TIA x2 on Plavix, rheumatoid arthritis, CKD stage III-IVwho [facility] for evaluation of chest pain. Patient underwent cardiac catheterization on 06/21/2018 which showed severe multivessel disease. CT surgery was consulted for  possible CABG.  Assessment & Plan    Chest Pain with Severe Multivessel CAD - Left heart catheterization showed severe multivessel CAD with 100% stenosis of proximal RCA (left to right collaterals present), 80% of 2nd Mrg, 90% stenosis of Ost 1st Diag to 1st Diag, and 80% of proximal LAD. - CT surgery was consulted and recommended CABG tomorrow after Plavix washout. - Continue Aspirin, statin, beta-blocker, and Imdur. - Patient denies any additional chest pain since Imdur was added yesterday.  Bradycardia - Heart rates mostly in the 50's on telemetry but will drop to the high 40's. No pauses noted. -  Currently on home Propranolol 40mg  twice daily. Will decrease to 20mg  twice daily. - Will continue to monitor on telemetry.  Hypertension - Most recent BP 126/59. - Continue Amlodipine 10mg  daily.  Hyperlipidemia - Lipid panel this admission: Cholesterol 113, Triglycerides 177, HDL 23, LDL 55. - LDL at goal of <70 given CAD. - Continue Lipitor 20mg  daily.  Pre-Diabetes - Hemoglobin A1c 6% this admission, consistent with pre-diabetes. - Will defer to primary team and PCP.  CKD Stage III-IV - Serum creatinine 2.09 on admission. Baseline appears to be 1.9 to 2.2. - Creatinine stable 2.56 today. - Continue to monitor. - Management per primary team.  Normocytic Anemia - Hemoglobin 10.8 on admission. Was 11.5 three months ago. - Hemoglobin stable at 11.1 yesterday. - Management per primary team.  Chronic Pain - Management per primary team.  For questions or updates, please contact [place] Please consult [email or web address] for contact info under        Signed, Darreld Mclean, PA-C  06/26/2018, 9:22 AM

## 2018-06-26 NOTE — H&P (View-Only) (Signed)
5 Days Post-Op Procedure(s) (LRB): LEFT HEART CATH AND CORONARY ANGIOGRAPHY (N/A) Subjective: No complaints, denies CP  Objective: Vital signs in last 24 hours: Temp:  [97.4 F (36.3 C)-98.1 F (36.7 C)] 97.5 F (36.4 C) (01/28 1634) Pulse Rate:  [50-63] 63 (01/28 1634) Cardiac Rhythm: Sinus bradycardia (01/28 1432) Resp:  [16-18] 18 (01/28 1634) BP: (118-138)/(59-88) 138/78 (01/28 1634) SpO2:  [96 %-100 %] 98 % (01/28 1634) Weight:  [78.8 kg-80.8 kg] 80.8 kg (01/28 1634)  Hemodynamic parameters for last 24 hours:    Intake/Output from previous day: 01/27 0701 - 01/28 0700 In: 1406 [P.O.:1400; I.V.:6] Out: 1250 [Urine:1250] Intake/Output this shift: Total I/O In: 840 [P.O.:840] Out: -   exam unchanged  Lab Results: Recent Labs    06/24/18 0948  WBC 6.9  HGB 11.1*  HCT 34.0*  PLT 219   BMET:  Recent Labs    06/24/18 0948 06/26/18 0326  NA 136 136  K 4.2 4.0  CL 104 102  CO2 23 25  GLUCOSE 133* 132*  BUN 26* 33*  CREATININE 2.18* 2.56*  CALCIUM 8.9 8.6*    PT/INR:  Recent Labs    06/26/18 0326  LABPROT 15.1  INR 1.20   ABG    Component Value Date/Time   PHART 7.434 06/25/2018 1750   HCO3 23.7 06/25/2018 1750   TCO2 29 04/08/2009 0738   ACIDBASEDEF 0.0 06/25/2018 1750   O2SAT 96.4 06/25/2018 1750   CBG (last 3)  No results for input(s): GLUCAP in the last 72 hours.  Assessment/Plan: S/P Procedure(s) (LRB): LEFT HEART CATH AND CORONARY ANGIOGRAPHY (N/A)  For CABG in AM All questions answered PFTs show moderate impairment of diffusion capacity   LOS: 5 days    Melrose Nakayama 06/26/2018

## 2018-06-27 ENCOUNTER — Inpatient Hospital Stay (HOSPITAL_COMMUNITY): Payer: Medicare HMO

## 2018-06-27 ENCOUNTER — Encounter (HOSPITAL_COMMUNITY): Payer: Self-pay

## 2018-06-27 ENCOUNTER — Inpatient Hospital Stay (HOSPITAL_COMMUNITY)
Admission: EM | Disposition: A | Discharge status: Home Health | Payer: Self-pay | Source: Home / Self Care | Attending: Thoracic Surgery (Cardiothoracic Vascular Surgery)

## 2018-06-27 DIAGNOSIS — I25110 Atherosclerotic heart disease of native coronary artery with unstable angina pectoris: Secondary | ICD-10-CM

## 2018-06-27 DIAGNOSIS — Z951 Presence of aortocoronary bypass graft: Secondary | ICD-10-CM

## 2018-06-27 HISTORY — PX: TEE WITHOUT CARDIOVERSION: SHX5443

## 2018-06-27 HISTORY — PX: CORONARY ARTERY BYPASS GRAFT: SHX141

## 2018-06-27 LAB — POCT I-STAT 7, (LYTES, BLD GAS, ICA,H+H)
Acid-Base Excess: 2 mmol/L (ref 0.0–2.0)
Acid-base deficit: 2 mmol/L (ref 0.0–2.0)
Acid-base deficit: 4 mmol/L — ABNORMAL HIGH (ref 0.0–2.0)
Acid-base deficit: 4 mmol/L — ABNORMAL HIGH (ref 0.0–2.0)
Acid-base deficit: 5 mmol/L — ABNORMAL HIGH (ref 0.0–2.0)
Bicarbonate: 26.3 mmol/L (ref 20.0–28.0)
Bicarbonate: 22.4 mmol/L (ref 20.0–28.0)
Bicarbonate: 22.4 mmol/L (ref 20.0–28.0)
Bicarbonate: 21.2 mmol/L (ref 20.0–28.0)
Bicarbonate: 20.7 mmol/L (ref 20.0–28.0)
Calcium, Ion: 1.07 mmol/L — ABNORMAL LOW (ref 1.15–1.40)
Calcium, Ion: 1.15 mmol/L (ref 1.15–1.40)
Calcium, Ion: 1.22 mmol/L (ref 1.15–1.40)
Calcium, Ion: 1.22 mmol/L (ref 1.15–1.40)
Calcium, Ion: 1.2 mmol/L (ref 1.15–1.40)
HCT: 21 % — ABNORMAL LOW (ref 39.0–52.0)
HCT: 20 % — ABNORMAL LOW (ref 39.0–52.0)
HCT: 25 % — ABNORMAL LOW (ref 39.0–52.0)
HCT: 26 % — ABNORMAL LOW (ref 39.0–52.0)
HCT: 29 % — ABNORMAL LOW (ref 39.0–52.0)
Hemoglobin: 7.1 g/dL — ABNORMAL LOW (ref 13.0–17.0)
Hemoglobin: 6.8 g/dL — CL (ref 13.0–17.0)
Hemoglobin: 8.5 g/dL — ABNORMAL LOW (ref 13.0–17.0)
Hemoglobin: 8.8 g/dL — ABNORMAL LOW (ref 13.0–17.0)
Hemoglobin: 9.9 g/dL — ABNORMAL LOW (ref 13.0–17.0)
O2 Saturation: 100 %
O2 Saturation: 99 %
O2 Saturation: 62 %
O2 Saturation: 99 %
O2 Saturation: 95 %
Patient temperature: 36.8
Patient temperature: 36
Patient temperature: 38
Potassium: 4.3 mmol/L (ref 3.5–5.1)
Potassium: 4.6 mmol/L (ref 3.5–5.1)
Potassium: 4.4 mmol/L (ref 3.5–5.1)
Potassium: 4.5 mmol/L (ref 3.5–5.1)
Potassium: 4.4 mmol/L (ref 3.5–5.1)
Sodium: 139 mmol/L (ref 135–145)
Sodium: 140 mmol/L (ref 135–145)
Sodium: 138 mmol/L (ref 135–145)
Sodium: 139 mmol/L (ref 135–145)
Sodium: 139 mmol/L (ref 135–145)
TCO2: 27 mmol/L (ref 22–32)
TCO2: 24 mmol/L (ref 22–32)
TCO2: 24 mmol/L (ref 22–32)
TCO2: 22 mmol/L (ref 22–32)
TCO2: 22 mmol/L (ref 22–32)
pCO2 arterial: 37.7 mmHg (ref 32.0–48.0)
pCO2 arterial: 37.2 mmHg (ref 32.0–48.0)
pCO2 arterial: 43.7 mmHg (ref 32.0–48.0)
pCO2 arterial: 36.5 mmHg (ref 32.0–48.0)
pCO2 arterial: 40.6 mmHg (ref 32.0–48.0)
pH, Arterial: 7.451 — ABNORMAL HIGH (ref 7.350–7.450)
pH, Arterial: 7.389 (ref 7.350–7.450)
pH, Arterial: 7.317 — ABNORMAL LOW (ref 7.350–7.450)
pH, Arterial: 7.368 (ref 7.350–7.450)
pH, Arterial: 7.319 — ABNORMAL LOW (ref 7.350–7.450)
pO2, Arterial: 463 mmHg — ABNORMAL HIGH (ref 83.0–108.0)
pO2, Arterial: 123 mmHg — ABNORMAL HIGH (ref 83.0–108.0)
pO2, Arterial: 35 mmHg — CL (ref 83.0–108.0)
pO2, Arterial: 115 mmHg — ABNORMAL HIGH (ref 83.0–108.0)
pO2, Arterial: 87 mmHg (ref 83.0–108.0)

## 2018-06-27 LAB — GLUCOSE, CAPILLARY
Glucose-Capillary: 105 mg/dL — ABNORMAL HIGH (ref 70–99)
Glucose-Capillary: 126 mg/dL — ABNORMAL HIGH (ref 70–99)
Glucose-Capillary: 110 mg/dL — ABNORMAL HIGH (ref 70–99)
Glucose-Capillary: 109 mg/dL — ABNORMAL HIGH (ref 70–99)
Glucose-Capillary: 108 mg/dL — ABNORMAL HIGH (ref 70–99)
Glucose-Capillary: 103 mg/dL — ABNORMAL HIGH (ref 70–99)

## 2018-06-27 LAB — POCT I-STAT 4, (NA,K, GLUC, HGB,HCT)
Glucose, Bld: 104 mg/dL — ABNORMAL HIGH (ref 70–99)
Glucose, Bld: 117 mg/dL — ABNORMAL HIGH (ref 70–99)
Glucose, Bld: 124 mg/dL — ABNORMAL HIGH (ref 70–99)
Glucose, Bld: 137 mg/dL — ABNORMAL HIGH (ref 70–99)
Glucose, Bld: 118 mg/dL — ABNORMAL HIGH (ref 70–99)
HCT: 27 % — ABNORMAL LOW (ref 39.0–52.0)
HCT: 21 % — ABNORMAL LOW (ref 39.0–52.0)
HCT: 23 % — ABNORMAL LOW (ref 39.0–52.0)
HCT: 21 % — ABNORMAL LOW (ref 39.0–52.0)
HCT: 20 % — ABNORMAL LOW (ref 39.0–52.0)
Hemoglobin: 9.2 g/dL — ABNORMAL LOW (ref 13.0–17.0)
Hemoglobin: 7.1 g/dL — ABNORMAL LOW (ref 13.0–17.0)
Hemoglobin: 7.8 g/dL — ABNORMAL LOW (ref 13.0–17.0)
Hemoglobin: 7.1 g/dL — ABNORMAL LOW (ref 13.0–17.0)
Hemoglobin: 6.8 g/dL — CL (ref 13.0–17.0)
Potassium: 3.9 mmol/L (ref 3.5–5.1)
Potassium: 4.1 mmol/L (ref 3.5–5.1)
Potassium: 4.2 mmol/L (ref 3.5–5.1)
Potassium: 5.3 mmol/L — ABNORMAL HIGH (ref 3.5–5.1)
Potassium: 4.6 mmol/L (ref 3.5–5.1)
Sodium: 139 mmol/L (ref 135–145)
Sodium: 138 mmol/L (ref 135–145)
Sodium: 140 mmol/L (ref 135–145)
Sodium: 139 mmol/L (ref 135–145)
Sodium: 141 mmol/L (ref 135–145)

## 2018-06-27 LAB — CBC
HCT: 23.2 % — ABNORMAL LOW (ref 39.0–52.0)
HCT: 29.1 % — ABNORMAL LOW (ref 39.0–52.0)
HCT: 29.5 % — ABNORMAL LOW (ref 39.0–52.0)
Hemoglobin: 10 g/dL — ABNORMAL LOW (ref 13.0–17.0)
Hemoglobin: 7.5 g/dL — ABNORMAL LOW (ref 13.0–17.0)
Hemoglobin: 9.6 g/dL — ABNORMAL LOW (ref 13.0–17.0)
MCH: 29.5 pg (ref 26.0–34.0)
MCH: 30 pg (ref 26.0–34.0)
MCH: 30.3 pg (ref 26.0–34.0)
MCHC: 33.9 g/dL (ref 30.0–36.0)
MCHC: 32.3 g/dL (ref 30.0–36.0)
MCHC: 33 g/dL (ref 30.0–36.0)
MCV: 89.4 fL (ref 80.0–100.0)
MCV: 90.9 fL (ref 80.0–100.0)
MCV: 91.3 fL (ref 80.0–100.0)
PLATELETS: 119 10*3/uL — AB (ref 150–400)
Platelets: 130 10*3/uL — ABNORMAL LOW (ref 150–400)
Platelets: 209 10*3/uL (ref 150–400)
RBC: 2.54 MIL/uL — ABNORMAL LOW (ref 4.22–5.81)
RBC: 3.2 MIL/uL — ABNORMAL LOW (ref 4.22–5.81)
RBC: 3.3 MIL/uL — ABNORMAL LOW (ref 4.22–5.81)
RDW: 13 % (ref 11.5–15.5)
RDW: 13.1 % (ref 11.5–15.5)
RDW: 13.2 % (ref 11.5–15.5)
WBC: 7.1 10*3/uL (ref 4.0–10.5)
WBC: 8.2 10*3/uL (ref 4.0–10.5)
WBC: 8.5 10*3/uL (ref 4.0–10.5)
nRBC: 0 % (ref 0.0–0.2)
nRBC: 0 % (ref 0.0–0.2)
nRBC: 0 % (ref 0.0–0.2)

## 2018-06-27 LAB — BASIC METABOLIC PANEL
Anion gap: 9 (ref 5–15)
BUN: 32 mg/dL — ABNORMAL HIGH (ref 8–23)
CHLORIDE: 103 mmol/L (ref 98–111)
CO2: 24 mmol/L (ref 22–32)
CREATININE: 2.16 mg/dL — AB (ref 0.61–1.24)
Calcium: 8.6 mg/dL — ABNORMAL LOW (ref 8.9–10.3)
GFR calc Af Amer: 33 mL/min — ABNORMAL LOW (ref >60)
GFR calc non Af Amer: 28 mL/min — ABNORMAL LOW (ref >60)
Glucose, Bld: 118 mg/dL — ABNORMAL HIGH (ref 70–99)
Potassium: 4 mmol/L (ref 3.5–5.1)
Sodium: 136 mmol/L (ref 135–145)

## 2018-06-27 LAB — PROTIME-INR
INR: 1.64
Prothrombin Time: 19.2 seconds — ABNORMAL HIGH (ref 11.4–15.2)

## 2018-06-27 LAB — PREPARE RBC (CROSSMATCH)

## 2018-06-27 LAB — MAGNESIUM: MAGNESIUM: 3.2 mg/dL — AB (ref 1.7–2.4)

## 2018-06-27 LAB — PLATELET COUNT: Platelets: 172 10*3/uL (ref 150–400)

## 2018-06-27 LAB — CREATININE, SERUM
Creatinine, Ser: 1.73 mg/dL — ABNORMAL HIGH (ref 0.61–1.24)
GFR calc Af Amer: 43 mL/min — ABNORMAL LOW (ref >60)
GFR calc non Af Amer: 37 mL/min — ABNORMAL LOW (ref >60)

## 2018-06-27 LAB — APTT: aPTT: 44 seconds — ABNORMAL HIGH (ref 24–36)

## 2018-06-27 LAB — HEMOGLOBIN AND HEMATOCRIT, BLOOD
HCT: 22.4 % — ABNORMAL LOW (ref 39.0–52.0)
Hemoglobin: 7.5 g/dL — ABNORMAL LOW (ref 13.0–17.0)

## 2018-06-27 SURGERY — CORONARY ARTERY BYPASS GRAFTING (CABG)
Anesthesia: General | Site: Chest

## 2018-06-27 MED ORDER — POTASSIUM CHLORIDE 10 MEQ/50ML IV SOLN: 10.0000 meq | INTRAVENOUS | Status: AC

## 2018-06-27 MED ORDER — INSULIN REGULAR(HUMAN) IN NACL 100-0.9 UT/100ML-% IV SOLN: INTRAVENOUS | Status: DC

## 2018-06-27 MED ORDER — MORPHINE SULFATE (PF) 2 MG/ML IV SOLN
1.0000 mg | INTRAVENOUS | Status: DC | PRN
  Administered 2018-06-27 – 2018-06-28 (×3): 2 mg via INTRAVENOUS
  Filled 2018-06-27 (×3): qty 1

## 2018-06-27 MED ORDER — ASPIRIN 81 MG PO CHEW: 324.0000 mg | CHEWABLE_TABLET | Freq: Every day | ORAL | Status: DC

## 2018-06-27 MED ORDER — TRAMADOL HCL 50 MG PO TABS
50.0000 mg | ORAL_TABLET | ORAL | Status: DC | PRN
  Administered 2018-06-28 – 2018-06-29 (×2): 100 mg via ORAL
  Filled 2018-06-27 (×2): qty 2

## 2018-06-27 MED ORDER — BISACODYL 10 MG RE SUPP: 10.0000 mg | Freq: Every day | RECTAL | Status: DC

## 2018-06-27 MED ORDER — ALBUMIN HUMAN 5 % IV SOLN
250.0000 mL | INTRAVENOUS | Status: AC | PRN
  Administered 2018-06-27 – 2018-06-28 (×2): 12.5 g via INTRAVENOUS

## 2018-06-27 MED ORDER — INSULIN REGULAR BOLUS VIA INFUSION
0.0000 [IU] | Freq: Three times a day (TID) | INTRAVENOUS | Status: DC
  Filled 2018-06-27: qty 10

## 2018-06-27 MED ORDER — CHLORHEXIDINE GLUCONATE CLOTH 2 % EX PADS
6.0000 | MEDICATED_PAD | Freq: Every day | CUTANEOUS | Status: DC
  Administered 2018-06-27 – 2018-06-28 (×2): 6 via TOPICAL

## 2018-06-27 MED ORDER — LACTATED RINGERS IV SOLN: INTRAVENOUS | Status: DC

## 2018-06-27 MED ORDER — SODIUM CHLORIDE 0.9 % IV SOLN
INTRAVENOUS | Status: DC
  Administered 2018-06-27: 13:00:00 via INTRAVENOUS

## 2018-06-27 MED ORDER — DOCUSATE SODIUM 100 MG PO CAPS
200.0000 mg | ORAL_CAPSULE | Freq: Every day | ORAL | Status: DC
  Administered 2018-06-28 – 2018-07-03 (×5): 200 mg via ORAL
  Filled 2018-06-27 (×6): qty 2

## 2018-06-27 MED ORDER — SODIUM CHLORIDE 0.9% FLUSH: 3.0000 mL | INTRAVENOUS | Status: DC | PRN

## 2018-06-27 MED ORDER — PROTAMINE SULFATE 10 MG/ML IV SOLN
INTRAVENOUS | Status: AC
  Filled 2018-06-27: qty 25

## 2018-06-27 MED ORDER — LACTATED RINGERS IV SOLN
INTRAVENOUS | Status: DC | PRN
  Administered 2018-06-27: 07:00:00 via INTRAVENOUS

## 2018-06-27 MED ORDER — SODIUM CHLORIDE 0.9 % IV SOLN: 250.0000 mL | INTRAVENOUS | Status: DC

## 2018-06-27 MED ORDER — MIDAZOLAM HCL 2 MG/2ML IJ SOLN: 2.0000 mg | INTRAMUSCULAR | Status: DC | PRN

## 2018-06-27 MED ORDER — PROTAMINE SULFATE 10 MG/ML IV SOLN
INTRAVENOUS | Status: AC
  Filled 2018-06-27: qty 5

## 2018-06-27 MED ORDER — PANTOPRAZOLE SODIUM 40 MG PO TBEC
40.0000 mg | DELAYED_RELEASE_TABLET | Freq: Every day | ORAL | Status: DC
  Administered 2018-06-29 – 2018-07-03 (×5): 40 mg via ORAL
  Filled 2018-06-27 (×5): qty 1

## 2018-06-27 MED ORDER — LACTATED RINGERS IV SOLN
INTRAVENOUS | Status: DC | PRN
  Administered 2018-06-27 (×2): via INTRAVENOUS

## 2018-06-27 MED ORDER — VECURONIUM BROMIDE 10 MG IV SOLR
INTRAVENOUS | Status: AC
  Filled 2018-06-27: qty 10

## 2018-06-27 MED ORDER — SODIUM CHLORIDE 0.9% FLUSH: 10.0000 mL | INTRAVENOUS | Status: DC | PRN

## 2018-06-27 MED ORDER — SODIUM CHLORIDE 0.9% FLUSH
10.0000 mL | Freq: Two times a day (BID) | INTRAVENOUS | Status: DC
  Administered 2018-06-27 – 2018-06-29 (×4): 10 mL

## 2018-06-27 MED ORDER — MIDAZOLAM HCL 2 MG/2ML IJ SOLN
INTRAMUSCULAR | Status: AC
  Filled 2018-06-27: qty 2

## 2018-06-27 MED ORDER — OXYCODONE HCL 5 MG PO TABS
5.0000 mg | ORAL_TABLET | ORAL | Status: DC | PRN
  Administered 2018-06-28 – 2018-06-29 (×4): 5 mg via ORAL
  Administered 2018-06-29 – 2018-07-04 (×13): 10 mg via ORAL
  Filled 2018-06-27 (×5): qty 2
  Filled 2018-06-27: qty 1
  Filled 2018-06-27 (×2): qty 2
  Filled 2018-06-27: qty 1
  Filled 2018-06-27 (×6): qty 2
  Filled 2018-06-27 (×2): qty 1
  Filled 2018-06-27: qty 2

## 2018-06-27 MED ORDER — ACETAMINOPHEN 650 MG RE SUPP
650.0000 mg | Freq: Once | RECTAL | Status: AC
  Administered 2018-06-27: 650 mg via RECTAL

## 2018-06-27 MED ORDER — DEXMEDETOMIDINE HCL IN NACL 200 MCG/50ML IV SOLN
0.0000 ug/kg/h | INTRAVENOUS | Status: DC
  Administered 2018-06-27: 0.7 ug/kg/h via INTRAVENOUS
  Filled 2018-06-27: qty 50

## 2018-06-27 MED ORDER — LIDOCAINE 2% (20 MG/ML) 5 ML SYRINGE
INTRAMUSCULAR | Status: AC
  Filled 2018-06-27: qty 5

## 2018-06-27 MED ORDER — FENTANYL CITRATE (PF) 250 MCG/5ML IJ SOLN
INTRAMUSCULAR | Status: DC | PRN
  Administered 2018-06-27 (×3): 150 ug via INTRAVENOUS
  Administered 2018-06-27: 250 ug via INTRAVENOUS
  Administered 2018-06-27: 50 ug via INTRAVENOUS
  Administered 2018-06-27: 150 ug via INTRAVENOUS
  Administered 2018-06-27: 100 ug via INTRAVENOUS
  Administered 2018-06-27: 250 ug via INTRAVENOUS
  Administered 2018-06-27 (×2): 100 ug via INTRAVENOUS
  Administered 2018-06-27: 50 ug via INTRAVENOUS
  Administered 2018-06-27: 250 ug via INTRAVENOUS

## 2018-06-27 MED ORDER — LACTATED RINGERS IV SOLN: 500.0000 mL | Freq: Once | INTRAVENOUS | Status: DC | PRN

## 2018-06-27 MED ORDER — PHENYLEPHRINE HCL-NACL 20-0.9 MG/250ML-% IV SOLN: 0.0000 ug/min | INTRAVENOUS | Status: DC

## 2018-06-27 MED ORDER — ASPIRIN EC 325 MG PO TBEC
325.0000 mg | DELAYED_RELEASE_TABLET | Freq: Every day | ORAL | Status: DC
  Administered 2018-06-28 – 2018-06-29 (×2): 325 mg via ORAL
  Filled 2018-06-27 (×2): qty 1

## 2018-06-27 MED ORDER — FAMOTIDINE IN NACL 20-0.9 MG/50ML-% IV SOLN
20.0000 mg | Freq: Two times a day (BID) | INTRAVENOUS | Status: DC
  Administered 2018-06-27: 20 mg via INTRAVENOUS

## 2018-06-27 MED ORDER — VECURONIUM BROMIDE 10 MG IV SOLR
INTRAVENOUS | Status: DC | PRN
  Administered 2018-06-27 (×3): 5 mg via INTRAVENOUS

## 2018-06-27 MED ORDER — 0.9 % SODIUM CHLORIDE (POUR BTL) OPTIME
TOPICAL | Status: DC | PRN
  Administered 2018-06-27: 5000 mL

## 2018-06-27 MED ORDER — INSULIN ASPART 100 UNIT/ML ~~LOC~~ SOLN
0.0000 [IU] | SUBCUTANEOUS | Status: DC
  Administered 2018-06-28: 2 [IU] via SUBCUTANEOUS

## 2018-06-27 MED ORDER — SODIUM CHLORIDE 0.9% FLUSH
3.0000 mL | Freq: Two times a day (BID) | INTRAVENOUS | Status: DC
  Administered 2018-06-28 – 2018-06-29 (×2): 3 mL via INTRAVENOUS

## 2018-06-27 MED ORDER — SODIUM CHLORIDE 0.45 % IV SOLN
INTRAVENOUS | Status: DC | PRN
  Administered 2018-06-27: 13:00:00 via INTRAVENOUS

## 2018-06-27 MED ORDER — ROCURONIUM BROMIDE 50 MG/5ML IV SOSY
PREFILLED_SYRINGE | INTRAVENOUS | Status: AC
  Filled 2018-06-27: qty 5

## 2018-06-27 MED ORDER — ACETAMINOPHEN 160 MG/5ML PO SOLN: 650.0000 mg | Freq: Once | ORAL | Status: AC

## 2018-06-27 MED ORDER — METOPROLOL TARTRATE 12.5 MG HALF TABLET
12.5000 mg | ORAL_TABLET | Freq: Two times a day (BID) | ORAL | Status: DC
  Administered 2018-06-28 – 2018-06-29 (×2): 12.5 mg via ORAL
  Filled 2018-06-27 (×2): qty 1

## 2018-06-27 MED ORDER — CHLORHEXIDINE GLUCONATE 0.12 % MT SOLN
15.0000 mL | OROMUCOSAL | Status: AC
  Administered 2018-06-27: 15 mL via OROMUCOSAL

## 2018-06-27 MED ORDER — LACTATED RINGERS IV SOLN
INTRAVENOUS | Status: DC | PRN
  Administered 2018-06-27 (×2): via INTRAVENOUS

## 2018-06-27 MED ORDER — DEXMEDETOMIDINE HCL 200 MCG/2ML IV SOLN
INTRAVENOUS | Status: DC | PRN
  Administered 2018-06-27 (×3): 4 ug via INTRAVENOUS

## 2018-06-27 MED ORDER — HEPARIN SODIUM (PORCINE) 1000 UNIT/ML IJ SOLN
INTRAMUSCULAR | Status: DC | PRN
  Administered 2018-06-27: 2000 [IU] via INTRAVENOUS
  Administered 2018-06-27: 25000 [IU] via INTRAVENOUS

## 2018-06-27 MED ORDER — FENTANYL CITRATE (PF) 250 MCG/5ML IJ SOLN
INTRAMUSCULAR | Status: AC
  Filled 2018-06-27: qty 25

## 2018-06-27 MED ORDER — VANCOMYCIN HCL IN DEXTROSE 1-5 GM/200ML-% IV SOLN
1000.0000 mg | Freq: Once | INTRAVENOUS | Status: AC
  Administered 2018-06-27: 1000 mg via INTRAVENOUS
  Filled 2018-06-27: qty 200

## 2018-06-27 MED ORDER — MIDAZOLAM HCL (PF) 10 MG/2ML IJ SOLN
INTRAMUSCULAR | Status: AC
  Filled 2018-06-27: qty 2

## 2018-06-27 MED ORDER — SODIUM CHLORIDE (PF) 0.9 % IJ SOLN
OROMUCOSAL | Status: DC | PRN
  Administered 2018-06-27 (×3): 4 mL via TOPICAL

## 2018-06-27 MED ORDER — MAGNESIUM SULFATE 4 GM/100ML IV SOLN
4.0000 g | Freq: Once | INTRAVENOUS | Status: AC
  Administered 2018-06-27: 4 g via INTRAVENOUS
  Filled 2018-06-27: qty 100

## 2018-06-27 MED ORDER — SODIUM CHLORIDE 0.9 % IV SOLN
1.5000 g | Freq: Two times a day (BID) | INTRAVENOUS | Status: AC
  Administered 2018-06-27 – 2018-06-29 (×4): 1.5 g via INTRAVENOUS
  Filled 2018-06-27 (×4): qty 1.5

## 2018-06-27 MED ORDER — ONDANSETRON HCL 4 MG/2ML IJ SOLN: 4.0000 mg | Freq: Four times a day (QID) | INTRAMUSCULAR | Status: DC | PRN

## 2018-06-27 MED ORDER — BISACODYL 5 MG PO TBEC
10.0000 mg | DELAYED_RELEASE_TABLET | Freq: Every day | ORAL | Status: DC
  Administered 2018-06-28 – 2018-07-03 (×5): 10 mg via ORAL
  Filled 2018-06-27 (×5): qty 2

## 2018-06-27 MED ORDER — LIDOCAINE HCL (CARDIAC) PF 100 MG/5ML IV SOSY
PREFILLED_SYRINGE | INTRAVENOUS | Status: DC | PRN
  Administered 2018-06-27: 100 mg via INTRAVENOUS

## 2018-06-27 MED ORDER — ROCURONIUM BROMIDE 100 MG/10ML IV SOLN
INTRAVENOUS | Status: DC | PRN
  Administered 2018-06-27: 50 mg via INTRAVENOUS

## 2018-06-27 MED ORDER — ALBUMIN HUMAN 5 % IV SOLN
INTRAVENOUS | Status: DC | PRN
  Administered 2018-06-27 (×2): via INTRAVENOUS

## 2018-06-27 MED ORDER — MIDAZOLAM HCL 5 MG/5ML IJ SOLN
INTRAMUSCULAR | Status: DC | PRN
  Administered 2018-06-27: 1 mg via INTRAVENOUS
  Administered 2018-06-27 (×5): 2 mg via INTRAVENOUS
  Administered 2018-06-27: 1 mg via INTRAVENOUS

## 2018-06-27 MED ORDER — NITROGLYCERIN IN D5W 200-5 MCG/ML-% IV SOLN: 0.0000 ug/min | INTRAVENOUS | Status: DC

## 2018-06-27 MED ORDER — ONDANSETRON HCL 4 MG/2ML IJ SOLN
INTRAMUSCULAR | Status: AC
  Filled 2018-06-27: qty 2

## 2018-06-27 MED ORDER — METOPROLOL TARTRATE 25 MG/10 ML ORAL SUSPENSION: 12.5000 mg | Freq: Two times a day (BID) | ORAL | Status: DC

## 2018-06-27 MED ORDER — HEMOSTATIC AGENTS (NO CHARGE) OPTIME
TOPICAL | Status: DC | PRN
  Administered 2018-06-27: 1 via TOPICAL

## 2018-06-27 MED ORDER — ACETAMINOPHEN 500 MG PO TABS
1000.0000 mg | ORAL_TABLET | Freq: Four times a day (QID) | ORAL | Status: AC
  Administered 2018-06-27 – 2018-07-02 (×20): 1000 mg via ORAL
  Filled 2018-06-27 (×20): qty 2

## 2018-06-27 MED ORDER — HEPARIN SODIUM (PORCINE) 1000 UNIT/ML IJ SOLN
INTRAMUSCULAR | Status: AC
  Filled 2018-06-27: qty 1

## 2018-06-27 MED ORDER — CHLORHEXIDINE GLUCONATE 0.12% ORAL RINSE (MEDLINE KIT): 15.0000 mL | Freq: Two times a day (BID) | OROMUCOSAL | Status: DC

## 2018-06-27 MED ORDER — METOPROLOL TARTRATE 5 MG/5ML IV SOLN: 2.5000 mg | INTRAVENOUS | Status: DC | PRN

## 2018-06-27 MED ORDER — FENTANYL CITRATE (PF) 250 MCG/5ML IJ SOLN
INTRAMUSCULAR | Status: AC
  Filled 2018-06-27: qty 5

## 2018-06-27 MED ORDER — SODIUM CHLORIDE 0.9% IV SOLUTION
Freq: Once | INTRAVENOUS | Status: AC
  Administered 2018-06-27: 14:00:00 via INTRAVENOUS

## 2018-06-27 MED ORDER — ORAL CARE MOUTH RINSE
15.0000 mL | OROMUCOSAL | Status: DC
  Administered 2018-06-27 (×2): 15 mL via OROMUCOSAL

## 2018-06-27 MED ORDER — PHENYLEPHRINE 40 MCG/ML (10ML) SYRINGE FOR IV PUSH (FOR BLOOD PRESSURE SUPPORT)
PREFILLED_SYRINGE | INTRAVENOUS | Status: AC
  Filled 2018-06-27: qty 10

## 2018-06-27 MED ORDER — ARTIFICIAL TEARS OPHTHALMIC OINT
TOPICAL_OINTMENT | OPHTHALMIC | Status: DC | PRN
  Administered 2018-06-27: 1 via OPHTHALMIC

## 2018-06-27 MED ORDER — PROPOFOL 10 MG/ML IV BOLUS
INTRAVENOUS | Status: DC | PRN
  Administered 2018-06-27: 120 mg via INTRAVENOUS

## 2018-06-27 MED ORDER — PROTAMINE SULFATE 10 MG/ML IV SOLN
INTRAVENOUS | Status: DC | PRN
  Administered 2018-06-27 (×3): 25 mg via INTRAVENOUS
  Administered 2018-06-27: 50 mg via INTRAVENOUS
  Administered 2018-06-27: 25 mg via INTRAVENOUS
  Administered 2018-06-27 (×2): 50 mg via INTRAVENOUS

## 2018-06-27 MED ORDER — PROPOFOL 10 MG/ML IV BOLUS
INTRAVENOUS | Status: AC
  Filled 2018-06-27: qty 20

## 2018-06-27 MED ORDER — PHENYLEPHRINE HCL 10 MG/ML IJ SOLN
INTRAMUSCULAR | Status: DC | PRN
  Administered 2018-06-27: 40 ug via INTRAVENOUS

## 2018-06-27 MED ORDER — ACETAMINOPHEN 160 MG/5ML PO SOLN: 1000.0000 mg | Freq: Four times a day (QID) | ORAL | Status: DC

## 2018-06-27 SURGICAL SUPPLY — 88 items
BAG DECANTER FOR FLEXI CONT (MISCELLANEOUS) ×4 IMPLANT
BANDAGE ACE 4X5 VEL STRL LF (GAUZE/BANDAGES/DRESSINGS) ×4 IMPLANT
BANDAGE ACE 6X5 VEL STRL LF (GAUZE/BANDAGES/DRESSINGS) ×4 IMPLANT
BANDAGE ELASTIC 4 VELCRO ST LF (GAUZE/BANDAGES/DRESSINGS) ×2 IMPLANT
BANDAGE ELASTIC 6 VELCRO ST LF (GAUZE/BANDAGES/DRESSINGS) ×2 IMPLANT
BASKET HEART  (ORDER IN 25'S) (MISCELLANEOUS) ×1
BASKET HEART (ORDER IN 25'S) (MISCELLANEOUS) ×1
BASKET HEART (ORDER IN 25S) (MISCELLANEOUS) ×2 IMPLANT
BLADE STERNUM SYSTEM 6 (BLADE) ×4 IMPLANT
BLADE SURG 11 STRL SS (BLADE) ×2 IMPLANT
BNDG GAUZE ELAST 4 BULKY (GAUZE/BANDAGES/DRESSINGS) ×4 IMPLANT
CANISTER SUCT 3000ML PPV (MISCELLANEOUS) ×4 IMPLANT
CANNULA EZ GLIDE AORTIC 21FR (CANNULA) ×4 IMPLANT
CATH CPB KIT HENDRICKSON (MISCELLANEOUS) ×4 IMPLANT
CATH ROBINSON RED A/P 18FR (CATHETERS) ×4 IMPLANT
CATH THORACIC 36FR (CATHETERS) ×4 IMPLANT
CATH THORACIC 36FR RT ANG (CATHETERS) ×4 IMPLANT
CLIP FOGARTY SPRING 6M (CLIP) ×2 IMPLANT
CLIP VESOCCLUDE MED 24/CT (CLIP) IMPLANT
CLIP VESOCCLUDE SM WIDE 24/CT (CLIP) ×4 IMPLANT
COVER WAND RF STERILE (DRAPES) ×4 IMPLANT
CRADLE DONUT ADULT HEAD (MISCELLANEOUS) ×4 IMPLANT
DERMABOND ADVANCED (GAUZE/BANDAGES/DRESSINGS) ×2
DERMABOND ADVANCED .7 DNX12 (GAUZE/BANDAGES/DRESSINGS) IMPLANT
DRAPE CARDIOVASCULAR INCISE (DRAPES) ×2
DRAPE SLUSH/WARMER DISC (DRAPES) ×4 IMPLANT
DRAPE SRG 135X102X78XABS (DRAPES) ×2 IMPLANT
DRSG COVADERM 4X14 (GAUZE/BANDAGES/DRESSINGS) ×4 IMPLANT
ELECT REM PT RETURN 9FT ADLT (ELECTROSURGICAL) ×8
ELECTRODE REM PT RTRN 9FT ADLT (ELECTROSURGICAL) ×4 IMPLANT
FELT TEFLON 1X6 (MISCELLANEOUS) ×8 IMPLANT
GAUZE SPONGE 4X4 12PLY STRL (GAUZE/BANDAGES/DRESSINGS) ×8 IMPLANT
GAUZE SPONGE 4X4 12PLY STRL LF (GAUZE/BANDAGES/DRESSINGS) ×6 IMPLANT
GLOVE SURG SIGNA 7.5 PF LTX (GLOVE) ×12 IMPLANT
GOWN STRL REUS W/ TWL LRG LVL3 (GOWN DISPOSABLE) ×8 IMPLANT
GOWN STRL REUS W/ TWL XL LVL3 (GOWN DISPOSABLE) ×4 IMPLANT
GOWN STRL REUS W/TWL LRG LVL3 (GOWN DISPOSABLE) ×8
GOWN STRL REUS W/TWL XL LVL3 (GOWN DISPOSABLE) ×4
HEMOSTAT POWDER SURGIFOAM 1G (HEMOSTASIS) ×12 IMPLANT
HEMOSTAT SURGICEL 2X14 (HEMOSTASIS) ×4 IMPLANT
INSERT FOGARTY XLG (MISCELLANEOUS) IMPLANT
KIT BASIN OR (CUSTOM PROCEDURE TRAY) ×4 IMPLANT
KIT SUCTION CATH 14FR (SUCTIONS) ×8 IMPLANT
KIT TURNOVER KIT B (KITS) ×4 IMPLANT
KIT VASOVIEW HEMOPRO 2 VH 4000 (KITS) ×4 IMPLANT
MARKER GRAFT CORONARY BYPASS (MISCELLANEOUS) ×12 IMPLANT
NS IRRIG 1000ML POUR BTL (IV SOLUTION) ×20 IMPLANT
PACK E OPEN HEART (SUTURE) ×4 IMPLANT
PACK OPEN HEART (CUSTOM PROCEDURE TRAY) ×4 IMPLANT
PAD ARMBOARD 7.5X6 YLW CONV (MISCELLANEOUS) ×8 IMPLANT
PAD ELECT DEFIB RADIOL ZOLL (MISCELLANEOUS) ×4 IMPLANT
PENCIL BUTTON HOLSTER BLD 10FT (ELECTRODE) ×4 IMPLANT
PUNCH AORTIC ROTATE 4.0MM (MISCELLANEOUS) IMPLANT
PUNCH AORTIC ROTATE 4.5MM 8IN (MISCELLANEOUS) IMPLANT
PUNCH AORTIC ROTATE 5MM 8IN (MISCELLANEOUS) IMPLANT
SET CARDIOPLEGIA MPS 5001102 (MISCELLANEOUS) ×2 IMPLANT
SOLUTION ANTI FOG 6CC (MISCELLANEOUS) ×2 IMPLANT
SPONGE LAP 4X18 RFD (DISPOSABLE) ×2 IMPLANT
SUT BONE WAX W31G (SUTURE) ×4 IMPLANT
SUT MNCRL AB 4-0 PS2 18 (SUTURE) ×2 IMPLANT
SUT PROLENE 3 0 SH DA (SUTURE) ×4 IMPLANT
SUT PROLENE 4 0 RB 1 (SUTURE) ×2
SUT PROLENE 4 0 SH DA (SUTURE) IMPLANT
SUT PROLENE 4-0 RB1 .5 CRCL 36 (SUTURE) IMPLANT
SUT PROLENE 6 0 C 1 30 (SUTURE) ×14 IMPLANT
SUT PROLENE 7 0 BV 1 (SUTURE) ×4 IMPLANT
SUT PROLENE 7 0 BV1 MDA (SUTURE) ×6 IMPLANT
SUT PROLENE 8 0 BV175 6 (SUTURE) ×2 IMPLANT
SUT STEEL 6MS V (SUTURE) ×4 IMPLANT
SUT STEEL STERNAL CCS#1 18IN (SUTURE) IMPLANT
SUT STEEL SZ 6 DBL 3X14 BALL (SUTURE) ×4 IMPLANT
SUT VIC AB 1 CTX 36 (SUTURE) ×6
SUT VIC AB 1 CTX36XBRD ANBCTR (SUTURE) ×4 IMPLANT
SUT VIC AB 2-0 CT1 27 (SUTURE) ×2
SUT VIC AB 2-0 CT1 TAPERPNT 27 (SUTURE) IMPLANT
SUT VIC AB 2-0 CTX 27 (SUTURE) IMPLANT
SUT VIC AB 3-0 SH 27 (SUTURE)
SUT VIC AB 3-0 SH 27X BRD (SUTURE) IMPLANT
SUT VIC AB 3-0 X1 27 (SUTURE) IMPLANT
SUT VICRYL 4-0 PS2 18IN ABS (SUTURE) IMPLANT
SYSTEM SAHARA CHEST DRAIN ATS (WOUND CARE) ×4 IMPLANT
TAPE CLOTH SURG 4X10 WHT LF (GAUZE/BANDAGES/DRESSINGS) ×2 IMPLANT
TOWEL GREEN STERILE (TOWEL DISPOSABLE) ×4 IMPLANT
TOWEL GREEN STERILE FF (TOWEL DISPOSABLE) ×4 IMPLANT
TRAY FOLEY SLVR 16FR TEMP STAT (SET/KITS/TRAYS/PACK) ×4 IMPLANT
TUBING INSUFFLATION (TUBING) ×4 IMPLANT
UNDERPAD 30X30 (UNDERPADS AND DIAPERS) ×4 IMPLANT
WATER STERILE IRR 1000ML POUR (IV SOLUTION) ×8 IMPLANT

## 2018-06-27 NOTE — Brief Op Note (Addendum)
06/19/2018 - 06/27/2018  11:17 AM  PATIENT:  Larena Sox  78 y.o. male  PRE-OPERATIVE DIAGNOSIS:  CAD  POST-OPERATIVE DIAGNOSIS:  CAD  PROCEDURE:  Procedure(s): CORONARY ARTERY BYPASS GRAFTING (CABG), ON PUMP, TIMES FOUR, USING LEFT INTERNAL MAMMARY ARTERY AND ENDOSCOPICALLY HARVESTED LEFT GREATER SAPHENOUS VEIN (N/A) TRANSESOPHAGEAL ECHOCARDIOGRAM (TEE) (N/A)  LIMA->LAD SVG->D1 SVG->OM2 SVG->PDA  SURGEON:  Surgeon(s) and Role:    * Melrose Nakayama, MD - Primary  PHYSICIAN ASSISTANT: Macarthur Critchley, PA-C  ANESTHESIA:   general  EBL:  Per perfusion and anesthesia records   BLOOD ADMINISTERED:none  DRAINS: Left pleural and mediastinal tubes   LOCAL MEDICATIONS USED:  NONE  SPECIMEN:  No Specimen  DISPOSITION OF SPECIMEN:  N/A  COUNTS:  YES  DICTATION: .Dragon Dictation  PLAN OF CARE: Admit to inpatient   PATIENT DISPOSITION:  ICU - intubated and hemodynamically stable.   Delay start of Pharmacological VTE agent (>24hrs) due to surgical blood loss or risk of bleeding: yes

## 2018-06-27 NOTE — Progress Notes (Signed)
  Echocardiogram Echocardiogram Transesophageal has been performed.  Kevin [name] 06/27/2018, 8:51 AM

## 2018-06-27 NOTE — Interval H&P Note (Signed)
History and Physical Interval Note:  06/27/2018 7:25 AM  Kevin [name]  has presented today for surgery, with the diagnosis of CAD  The various methods of treatment have been discussed with the patient and family. After consideration of risks, benefits and other options for treatment, the patient has consented to  Procedure(s): CORONARY ARTERY BYPASS GRAFTING (CABG) (N/A) TRANSESOPHAGEAL ECHOCARDIOGRAM (TEE) (N/A) as a surgical intervention .  The patient's history has been reviewed, patient examined, no change in status, stable for surgery.  I have reviewed the patient's chart and labs.  Questions were answered to the patient's satisfaction.     Melrose Nakayama

## 2018-06-27 NOTE — Anesthesia Procedure Notes (Signed)
Procedure Name: Intubation Date/Time: 06/27/2018 7:43 AM Performed by: Neldon Newport, CRNA Pre-anesthesia Checklist: Timeout performed, Patient being monitored, Suction available, Emergency Drugs available and Patient identified Patient Re-evaluated:Patient Re-evaluated prior to induction Oxygen Delivery Method: Circle system utilized Preoxygenation: Pre-oxygenation with 100% oxygen Induction Type: IV induction Ventilation: Mask ventilation without difficulty Laryngoscope Size: Mac and 4 Grade View: Grade I Tube type: Oral Tube size: 8.0 mm Number of attempts: 1 Airway Equipment and Method: Patient positioned with wedge pillow Placement Confirmation: ETT inserted through vocal cords under direct vision,  positive ETCO2 and breath sounds checked- equal and bilateral Secured at: 23 (Lip) cm Tube secured with: Tape Dental Injury: Teeth and Oropharynx as per pre-operative assessment

## 2018-06-27 NOTE — Transfer of Care (Signed)
Immediate Anesthesia Transfer of Care Note  Patient: Kevin [name]  Procedure(s) Performed: CORONARY ARTERY BYPASS GRAFTING (CABG), ON PUMP, TIMES FOUR, USING LEFT INTERNAL MAMMARY ARTERY AND ENDOSCOPICALLY HARVESTED LEFT GREATER SAPHENOUS VEIN (N/A Chest) TRANSESOPHAGEAL ECHOCARDIOGRAM (TEE) (N/A )  Patient Location: SICU  Anesthesia Type:General  Level of Consciousness: Patient remains intubated per anesthesia plan  Airway & Oxygen Therapy: Patient remains intubated per anesthesia plan and Patient placed on Ventilator (see vital sign flow sheet for setting)  Post-op Assessment: Report given to RN and Post -op Vital signs reviewed and stable  Post vital signs: Reviewed and stable  Last Vitals:  Vitals Value Taken Time  BP    Temp    Pulse    Resp    SpO2      Last Pain:  Vitals:   06/27/18 0451  TempSrc: Oral  PainSc:       Patients Stated Pain Goal: 0 (02/26/56 4734)  Complications: No apparent anesthesia complications

## 2018-06-27 NOTE — Op Note (Signed)
NAME: Kevin, [name] MEDICAL RECORD [id number] ACCOUNT [id number] DATE OF BIRTH:1941-02-15 FACILITY: [facility] LOCATION: [facility] PHYSICIAN:Mackenna Kamer Chaya Jan, MD  OPERATIVE REPORT  DATE OF PROCEDURE:  06/27/2018  PREOPERATIVE DIAGNOSES:  Three-vessel coronary disease with unstable angina.  POSTOPERATIVE DIAGNOSIS:  Three-vessel coronary disease with unstable angina.  PROCEDURE: 1.  Median sternotomy, extracorporeal circulation, coronary artery bypass grafting x4. 2.  Left internal mammary artery to left anterior descending. 3.  Saphenous vein graft to first diagonal. 4.  Saphenous vein graft to obtuse marginal 2. 5.  Saphenous vein graft to posterior descending. 6.  Endoscopic vein harvest, left leg.  SURGEON:  Modesto Charon, MD  ASSISTANT:  Enid Cutter, PA  ANESTHESIA:  General.  FINDINGS:  Transesophageal echocardiography revealed preserved left ventricular function with no significant valvular pathology.  No suitable veins found in the right leg.  Saphenous vein from the left leg good quality. Mammary good quality. Targets good  quality.  OM2 grafted distally due to heavy calcification proximally.  CLINICAL NOTE:  The patient is a 78 year old gentleman with multiple cardiac risk factors and known peripheral arterial disease.  He presented with class IV angina.  He ruled out for a non-ST elevation MI.  At catheterization, he was found to have  3-vessel coronary disease.  He was referred for coronary artery bypass grafting.  The indications, risks, benefits, and alternatives were discussed in detail with the patient.  He understood and accepted the risks and agreed to proceed.  He was on  clopidogrel on admission and needed interval time for that to wash out of his system prior to elective bypass surgery.  OPERATIVE NOTE:  The patient was brought to the preoperative holding area on 06/27/2018.  Anesthesia placed a Swan-Ganz catheter and an arterial blood pressure  monitoring line.  He was taken to the operating room, anesthetized and intubated.  Intravenous  antibiotics were administered.  A Foley catheter was placed.  The chest, abdomen and legs were prepped and draped in the usual sterile fashion.  Transesophageal echocardiography was performed by Dr. Finis Bud.  It revealed normal left ventricular  function with no significant valvular pathology.  A time-out was performed.  An incision was made in the medial aspect of the right leg just below the knee.  No vein that was suitable for use as a bypass graft was found in that site, so an incision was made at a similar site on the left leg, and the greater saphenous vein was identified there.  It was harvested endoscopically from mid-calf to the groin.  Simultaneously with the vein harvest, a median sternotomy was performed, and the left internal mammary artery was harvested using standard technique.   Heparin 2000 units was administered during the vessel harvest.  The remainder of the full heparin dose was given after harvesting the conduits.  Both the saphenous vein and mammary artery were good quality vessels.  The pericardium was opened.  The ascending aorta was inspected.  It was relatively long but not dilated.  There was significant plaque in the distal ascending aorta.  There was sufficient length of the aorta that this area did not have to be manipulated.   The cannula was placed proximal to this area in a relatively normal portion of the aorta.  After confirming adequate anticoagulation with ACT measurement, the aorta was cannulated via concentric 2-0 Ethibond pledgeted pursestring sutures.  A dual-stage  venous cannula was placed via a pursestring suture in the right atrial appendage.  Cardiopulmonary bypass was initiated.  Flows were maintained per protocol.  The patient was cooled to 32 degrees Celsius.  The coronary arteries were inspected and  anastomotic sites were chosen.  The conduits were  inspected and cut to length.  A foam pad was placed in the pericardium.  A temperature probe was placed in the myocardial septum, and a cardioplegia cannula was placed in the ascending aorta.  The aorta was cross clamped.  The left ventricle was emptied via the aortic root vent.  Cardiac arrest then was achieved with a combination of cold antegrade blood cardioplegia and topical iced saline.  One liter of cardioplegia was administered.  There  was a rapid diastolic arrest.  There was septal cooling to 9 degrees Celsius.  A reversed saphenous vein graft was placed end-to-side in the posterior descending branch of the right coronary.  This was a 1.5 mm good quality target at the site of anastomosis.  The vein was of good quality.  The anastomosis was performed with a  running 7-0 Prolene suture.  All anastomoses were probed proximally and distally at their completion.  Cardioplegia was administered down each vein graft at its completion to assess flow and hemostasis, which was good in all cases.  A reversed saphenous vein graft then was placed end-to-side to the first diagonal branch of the LAD.  This was a 1.5 mm good quality target as well.  Again, the end-to-side anastomosis was performed with a running 7-0 Prolene suture.  The heart was elevated, exposing the lateral wall.  The second obtuse marginal was heavily calcified proximally.  More distally, it was a smaller vessel, but is still accepted a 1.5 mm probe.  The vein was slightly smaller caliber than the other 2  veins but was still of good quality.  It was anastomosed end-to-side with a running 7-0 Prolene suture.  Additional cardioplegia was administered down the aortic root.  The left internal mammary artery was brought through a window in the pericardium.  The distal limb was bevelled.  It was anastomosed end-to-side to the distal LAD.  The LAD was 1.5 mm good  quality target at the site of anastomosis.  The mammary was a 2 mm good quality  conduit.  The end-to-side anastomosis was performed with a running 8-0 Prolene suture.  After completion of the anastomosis, the bulldog clamp was briefly removed.  Rapid  septal rewarming was noted.  The bulldog clamp was replaced.  The mammary pedicle was tacked to the epicardial surface of the heart with 6-0 Prolene sutures.  Additional cardioplegia was administered.  The vein grafts were cut to length.  The proximal vein graft anastomoses were performed to 4.0 mm punch aortotomies with running 6-0 Prolene sutures.  After completion of the final proximal anastomosis, the  patient was placed in Trendelenburg position.  Lidocaine was administered.  The aortic root was de-aired and the aortic crossclamp was removed.  The total crossclamp time was 67 minutes.  The patient initially fibrillated.  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  While rewarming was completed, all proximal and distal anastomoses were inspected for hemostasis.  Epicardial pacing wires were placed on the right ventricle and right atrium and DDD pacing was initiated.  When the patient had rewarmed to a core  temperature of 37 degrees Celsius, he was weaned from cardiopulmonary bypass on the first attempt.  He was DDD paced at 80 beats per minute and on no inotropic support.  The total bypass time was 100 minutes.  The initial cardiac index was greater than 2  L/min per sq m.  He remained hemodynamically stable throughout the post-bypass.  Post-bypass transesophageal echocardiography showed some septal dyskinesis secondary to pacing but otherwise preserved left ventricular function.  A test dose of protamine was administered and was well tolerated.  The atrial and aortic cannulae were removed.  The remainder of protamine was administered without incident.  The chest was irrigated with warm saline.  Hemostasis was achieved.  The  pericardium was reapproximated over the ascending  aorta and base of the heart with interrupted 3-0 silk sutures.  It came together easily without tension or kinking the underlying grafts.  Left pleural and mediastinal chest tubes were placed through separate  subcostal incisions and secured with #1 silk sutures.  The sternum was closed with a combination of single and double heavy gauge stainless steel wires.  Pectoralis fascia, subcutaneous tissue and skin were closed in standard fashion.  All sponge,  needle and instrument counts were correct at the end of the procedure.  The patient was taken from the operating room to the surgical intensive care unit intubated and in good condition.  LN/NUANCE  D:06/27/2018 T:06/27/2018 JOB:[id number]

## 2018-06-27 NOTE — Anesthesia Procedure Notes (Signed)
Arterial Line Insertion Start/End1/29/2020 7:00 AM, 06/27/2018 7:15 AM Performed by: Neldon Newport, CRNA, CRNA  Preanesthetic checklist: patient identified, IV checked, risks and benefits discussed, surgical consent, monitors and equipment checked and timeout performed Lidocaine 1% used for infiltration and patient sedated Left, radial was placed Catheter size: 20 G Hand hygiene performed  and maximum sterile barriers used   Attempts: 1 Procedure performed without using ultrasound guided technique. Following insertion, dressing applied. Post procedure assessment: normal and unchanged  Patient tolerated the procedure well with no immediate complications.

## 2018-06-27 NOTE — Anesthesia Procedure Notes (Addendum)
Central Venous Catheter Insertion Performed by: Belinda Block, MD, anesthesiologist Start/End1/29/2020 6:50 AM, 06/27/2018 7:10 AM Patient location: Pre-op. Preanesthetic checklist: patient identified, IV checked, site marked, risks and benefits discussed, surgical consent, monitors and equipment checked, pre-op evaluation and timeout performed Position: Trendelenburg Lidocaine 1% used for infiltration and patient sedated Hand hygiene performed , maximum sterile barriers used  and Seldinger technique used Central line was placed.Double lumen Swan type:thermodilution Procedure performed using ultrasound guided technique. Ultrasound Notes:anatomy identified and image(s) printed for medical record Attempts: 1 Following insertion, line sutured, dressing applied and Biopatch. Post procedure assessment: blood return through all ports  Patient tolerated the procedure well with no immediate complications.

## 2018-06-27 NOTE — Progress Notes (Signed)
      [address]       [place] [address]             [phone]      S/p CABG x 4  Starting to wake up and follow commands  BP 115/74   Pulse 89   Temp 98.1 F (36.7 C)   Resp 12   Ht 5\' 7"  (1.702 m)   Wt 78.1 kg   SpO2 100%   BMI 26.95 kg/m   Intake/Output Summary (Last 24 hours) at 06/27/2018 1708 Last data filed at 06/27/2018 1703 Gross per 24 hour  Intake 5148.98 ml  Output 2235 ml  Net 2913.98 ml   Hgb 7.5- transfused 1 unit PRBC  Doing well early postop  Wean to extubate  Remo Lipps C. Roxan Hockey, MD [facility] [phone]

## 2018-06-27 NOTE — Procedures (Signed)
Extubation Procedure Note  Patient Details:   Name: Kevin [name] DOB: 08-03-1940 MRN: [id number]   Airway Documentation:    Vent end date: 06/27/18 Vent end time: 1847   Evaluation  O2 sats: stable throughout Complications: No apparent complications Patient did tolerate procedure well. Bilateral Breath Sounds: Diminished, Clear   Yes   Patient had cuff leak prior to extubation. NIF -22, VC 1L.  NO stridor noted, vitals stable. [place]   [name] R Fallon Haecker 06/27/2018, 7:13 PM

## 2018-06-27 NOTE — Anesthesia Postprocedure Evaluation (Signed)
Anesthesia Post Note  Patient: Kevin [name]  Procedure(s) Performed: CORONARY ARTERY BYPASS GRAFTING (CABG), ON PUMP, TIMES FOUR, USING LEFT INTERNAL MAMMARY ARTERY AND ENDOSCOPICALLY HARVESTED LEFT GREATER SAPHENOUS VEIN (N/A Chest) TRANSESOPHAGEAL ECHOCARDIOGRAM (TEE) (N/A )     Patient location during evaluation: SICU Anesthesia Type: General Level of consciousness: patient remains intubated per anesthesia plan Pain management: pain level controlled Vital Signs Assessment: post-procedure vital signs reviewed and stable Respiratory status: patient remains intubated per anesthesia plan Cardiovascular status: stable Postop Assessment: no apparent nausea or vomiting Anesthetic complications: no    Last Vitals:  Vitals:   06/27/18 1515 06/27/18 1530  BP:    Pulse: 89 89  Resp: 12 12  Temp: (!) 35.9 C (!) 36 C  SpO2: 100% 100%    Last Pain:  Vitals:   06/27/18 1400  TempSrc: Core  PainSc:                  Kevin [name]

## 2018-06-28 ENCOUNTER — Inpatient Hospital Stay (HOSPITAL_COMMUNITY): Payer: Medicare HMO

## 2018-06-28 ENCOUNTER — Encounter (HOSPITAL_COMMUNITY): Payer: Self-pay | Admitting: Thoracic Surgery (Cardiothoracic Vascular Surgery)

## 2018-06-28 ENCOUNTER — Telehealth: Payer: Self-pay | Admitting: Family Medicine

## 2018-06-28 DIAGNOSIS — Z951 Presence of aortocoronary bypass graft: Secondary | ICD-10-CM

## 2018-06-28 LAB — POCT I-STAT 7, (LYTES, BLD GAS, ICA,H+H)
Acid-base deficit: 3 mmol/L — ABNORMAL HIGH (ref 0.0–2.0)
Bicarbonate: 21.9 mmol/L (ref 20.0–28.0)
Calcium, Ion: 1.16 mmol/L (ref 1.15–1.40)
HCT: 19 % — ABNORMAL LOW (ref 39.0–52.0)
Hemoglobin: 6.5 g/dL — CL (ref 13.0–17.0)
O2 Saturation: 95 %
Patient temperature: 36
Potassium: 4.3 mmol/L (ref 3.5–5.1)
Sodium: 140 mmol/L (ref 135–145)
TCO2: 23 mmol/L (ref 22–32)
pCO2 arterial: 34.4 mmHg (ref 32.0–48.0)
pH, Arterial: 7.406 (ref 7.350–7.450)
pO2, Arterial: 73 mmHg — ABNORMAL LOW (ref 83.0–108.0)

## 2018-06-28 LAB — BASIC METABOLIC PANEL
Anion gap: 8 (ref 5–15)
Anion gap: 7 (ref 5–15)
BUN: 21 mg/dL (ref 8–23)
BUN: 19 mg/dL (ref 8–23)
CHLORIDE: 106 mmol/L (ref 98–111)
CO2: 22 mmol/L (ref 22–32)
CO2: 20 mmol/L — ABNORMAL LOW (ref 22–32)
CREATININE: 1.87 mg/dL — AB (ref 0.61–1.24)
Calcium: 7.4 mg/dL — ABNORMAL LOW (ref 8.9–10.3)
Calcium: 7.9 mg/dL — ABNORMAL LOW (ref 8.9–10.3)
Chloride: 107 mmol/L (ref 98–111)
Creatinine, Ser: 1.74 mg/dL — ABNORMAL HIGH (ref 0.61–1.24)
GFR calc Af Amer: 42 mL/min — ABNORMAL LOW (ref >60)
GFR calc Af Amer: 39 mL/min — ABNORMAL LOW (ref >60)
GFR calc non Af Amer: 36 mL/min — ABNORMAL LOW (ref >60)
GFR calc non Af Amer: 33 mL/min — ABNORMAL LOW (ref >60)
Glucose, Bld: 112 mg/dL — ABNORMAL HIGH (ref 70–99)
Glucose, Bld: 127 mg/dL — ABNORMAL HIGH (ref 70–99)
POTASSIUM: 4.3 mmol/L (ref 3.5–5.1)
Potassium: 4.4 mmol/L (ref 3.5–5.1)
Sodium: 137 mmol/L (ref 135–145)
Sodium: 133 mmol/L — ABNORMAL LOW (ref 135–145)

## 2018-06-28 LAB — CBC
HCT: 26.8 % — ABNORMAL LOW (ref 39.0–52.0)
HEMATOCRIT: 25.9 % — AB (ref 39.0–52.0)
Hemoglobin: 8.8 g/dL — ABNORMAL LOW (ref 13.0–17.0)
Hemoglobin: 8.7 g/dL — ABNORMAL LOW (ref 13.0–17.0)
MCH: 31 pg (ref 26.0–34.0)
MCH: 30 pg (ref 26.0–34.0)
MCHC: 34 g/dL (ref 30.0–36.0)
MCHC: 32.5 g/dL (ref 30.0–36.0)
MCV: 91.2 fL (ref 80.0–100.0)
MCV: 92.4 fL (ref 80.0–100.0)
Platelets: 130 10*3/uL — ABNORMAL LOW (ref 150–400)
Platelets: 120 10*3/uL — ABNORMAL LOW (ref 150–400)
RBC: 2.84 MIL/uL — ABNORMAL LOW (ref 4.22–5.81)
RBC: 2.9 MIL/uL — ABNORMAL LOW (ref 4.22–5.81)
RDW: 13.8 % (ref 11.5–15.5)
RDW: 13.9 % (ref 11.5–15.5)
WBC: 8.5 10*3/uL (ref 4.0–10.5)
WBC: 9.1 10*3/uL (ref 4.0–10.5)
nRBC: 0 % (ref 0.0–0.2)
nRBC: 0 % (ref 0.0–0.2)

## 2018-06-28 LAB — GLUCOSE, CAPILLARY
Glucose-Capillary: 107 mg/dL — ABNORMAL HIGH (ref 70–99)
Glucose-Capillary: 111 mg/dL — ABNORMAL HIGH (ref 70–99)
Glucose-Capillary: 112 mg/dL — ABNORMAL HIGH (ref 70–99)
Glucose-Capillary: 112 mg/dL — ABNORMAL HIGH (ref 70–99)
Glucose-Capillary: 115 mg/dL — ABNORMAL HIGH (ref 70–99)
Glucose-Capillary: 117 mg/dL — ABNORMAL HIGH (ref 70–99)
Glucose-Capillary: 122 mg/dL — ABNORMAL HIGH (ref 70–99)
Glucose-Capillary: 134 mg/dL — ABNORMAL HIGH (ref 70–99)
Glucose-Capillary: 45 mg/dL — ABNORMAL LOW (ref 70–99)

## 2018-06-28 LAB — MAGNESIUM
Magnesium: 2.7 mg/dL — ABNORMAL HIGH (ref 1.7–2.4)
Magnesium: 2.4 mg/dL (ref 1.7–2.4)

## 2018-06-28 MED ORDER — VITAMIN D 25 MCG (1000 UNIT) PO TABS
1000.0000 [IU] | ORAL_TABLET | Freq: Every day | ORAL | Status: DC
  Administered 2018-06-28 – 2018-07-04 (×7): 1000 [IU] via ORAL
  Filled 2018-06-28 (×7): qty 1

## 2018-06-28 MED ORDER — CLOPIDOGREL BISULFATE 75 MG PO TABS
75.0000 mg | ORAL_TABLET | Freq: Every day | ORAL | Status: DC
  Administered 2018-06-29 – 2018-07-01 (×3): 75 mg via ORAL
  Filled 2018-06-28 (×3): qty 1

## 2018-06-28 MED ORDER — ENOXAPARIN SODIUM 40 MG/0.4ML ~~LOC~~ SOLN: 40.0000 mg | Freq: Every day | SUBCUTANEOUS | Status: DC

## 2018-06-28 MED ORDER — ALBUTEROL SULFATE (2.5 MG/3ML) 0.083% IN NEBU
3.0000 mL | INHALATION_SOLUTION | Freq: Four times a day (QID) | RESPIRATORY_TRACT | Status: DC | PRN
  Administered 2018-07-02: 3 mL via RESPIRATORY_TRACT
  Filled 2018-06-28: qty 3

## 2018-06-28 MED ORDER — TRIAMCINOLONE ACETONIDE 0.1 % EX CREA
1.0000 "application " | TOPICAL_CREAM | Freq: Two times a day (BID) | CUTANEOUS | Status: DC
  Administered 2018-06-28 – 2018-06-30 (×2): 1 via TOPICAL
  Filled 2018-06-28: qty 15

## 2018-06-28 MED ORDER — ENOXAPARIN SODIUM 30 MG/0.3ML ~~LOC~~ SOLN
30.0000 mg | Freq: Every day | SUBCUTANEOUS | Status: DC
  Administered 2018-06-28 – 2018-06-29 (×2): 30 mg via SUBCUTANEOUS
  Filled 2018-06-28 (×2): qty 0.3

## 2018-06-28 MED ORDER — MELATONIN 3 MG PO TABS: 9.0000 mg | ORAL_TABLET | Freq: Every day | ORAL | Status: DC

## 2018-06-28 MED ORDER — INSULIN ASPART 100 UNIT/ML ~~LOC~~ SOLN
0.0000 [IU] | SUBCUTANEOUS | Status: DC
  Administered 2018-06-28: 2 [IU] via SUBCUTANEOUS

## 2018-06-28 MED ORDER — MELATONIN 3 MG PO TABS
9.0000 mg | ORAL_TABLET | Freq: Every day | ORAL | Status: DC
  Administered 2018-06-28 – 2018-07-03 (×6): 9 mg via ORAL
  Filled 2018-06-28 (×6): qty 3

## 2018-06-28 MED ORDER — VITAMIN B-12 1000 MCG PO TABS
1000.0000 ug | ORAL_TABLET | Freq: Every day | ORAL | Status: DC
  Administered 2018-06-28 – 2018-07-04 (×7): 1000 ug via ORAL
  Filled 2018-06-28 (×7): qty 1

## 2018-06-28 MED ORDER — VITAMIN D-3 25 MCG (1000 UT) PO CAPS: 1000.0000 [IU] | ORAL_CAPSULE | Freq: Every day | ORAL | Status: DC

## 2018-06-28 MED FILL — Heparin Sodium (Porcine) Inj 1000 Unit/ML: INTRAMUSCULAR | Qty: 30 | Status: AC

## 2018-06-28 MED FILL — Sodium Chloride IV Soln 0.9%: INTRAVENOUS | Qty: 2000 | Status: AC

## 2018-06-28 MED FILL — Potassium Chloride Inj 2 mEq/ML: INTRAVENOUS | Qty: 40 | Status: AC

## 2018-06-28 MED FILL — Sodium Bicarbonate IV Soln 8.4%: INTRAVENOUS | Qty: 50 | Status: AC

## 2018-06-28 MED FILL — Electrolyte-R (PH 7.4) Solution: INTRAVENOUS | Qty: 3000 | Status: AC

## 2018-06-28 MED FILL — Magnesium Sulfate Inj 50%: INTRAMUSCULAR | Qty: 10 | Status: AC

## 2018-06-28 MED FILL — Heparin Sodium (Porcine) Inj 1000 Unit/ML: INTRAMUSCULAR | Qty: 10 | Status: AC

## 2018-06-28 NOTE — Progress Notes (Signed)
Pt wife requesting pt to have a hospital bed at discharge for patients chest and back. RN will continue to monitor.

## 2018-06-28 NOTE — Telephone Encounter (Signed)
See note  Copied from [place] [phone]. Topic: General - Other >> Jun 28, 2018  8:27 AM Ivar Drape wrote: Reason for CRM:   Patient's wife, [phone] [name] would like a return call from Dr. [name].  Her husband just had quad bypass surgery and she wants to talk to Dr. [name].

## 2018-06-28 NOTE — Progress Notes (Signed)
Patient ID: Kevin [name], male   DOB: 01-Sep-1940, 79 y.o.   MRN: [id number] TCTS Evening Rounds:  Hemodynamically stable Atrial paced at 80. Urine output ok Ambulated some and up in chair for a few hrs. Glucose under good control

## 2018-06-28 NOTE — Progress Notes (Signed)
1 Day Post-Op Procedure(s) (LRB): CORONARY ARTERY BYPASS GRAFTING (CABG), ON PUMP, TIMES FOUR, USING LEFT INTERNAL MAMMARY ARTERY AND ENDOSCOPICALLY HARVESTED LEFT GREATER SAPHENOUS VEIN (N/A) TRANSESOPHAGEAL ECHOCARDIOGRAM (TEE) (N/A) Subjective: No complaints this AM Denies pain, nausea  Objective: Vital signs in last 24 hours: Temp:  [96.3 F (35.7 C)-100.6 F (38.1 C)] 99.3 F (37.4 C) (01/30 0700) Pulse Rate:  [79-90] 80 (01/30 0700) Cardiac Rhythm: A-V Sequential paced (01/30 0400) Resp:  [12-30] 15 (01/30 0700) BP: (85-126)/(55-77) 116/57 (01/30 0700) SpO2:  [91 %-100 %] 99 % (01/30 0700) Arterial Line BP: (103-158)/(36-62) 131/40 (01/30 0700) FiO2 (%):  [40 %-50 %] 40 % (01/29 1800) Weight:  [82.9 kg] 82.9 kg (01/30 0500)  Hemodynamic parameters for last 24 hours: PAP: (17-36)/(2-16) 17/3 CO:  [3 L/min-6.3 L/min] 5.6 L/min CI:  [1.6 L/min/m2-3.3 L/min/m2] 3 L/min/m2  Intake/Output from previous day: 01/29 0701 - 01/30 0700 In: 7253.6 [P.O.:600; I.V.:3191.3; Blood:991; IV Piggyback:1823.9] Out: 3270 [Urine:1740; Emesis/NG output:110; Blood:850; Chest Tube:570] Intake/Output this shift: No intake/output data recorded.  General appearance: alert, cooperative and no distress Neurologic: no focal deficits Heart: regular rate and rhythm and + rub Lungs: diminished breath sounds bibasilar Abdomen: normal findings: soft, non-tender  Lab Results: Recent Labs    06/27/18 1923 06/27/18 1949 06/28/18 0420  WBC 8.2  --  8.5  HGB 9.6* 9.9* 8.8*  HCT 29.1* 29.0* 25.9*  PLT 130*  --  130*   BMET:  Recent Labs    06/27/18 0015  06/27/18 1204  06/27/18 1923 06/27/18 1949 06/28/18 0420  NA 136   < > 141   < >  --  139 137  K 4.0   < > 4.6   < >  --  4.4 4.4  CL 103  --   --   --   --   --  107  CO2 24  --   --   --   --   --  22  GLUCOSE 118*   < > 118*  --   --   --  112*  BUN 32*  --   --   --   --   --  21  CREATININE 2.16*  --   --   --  1.73*  --  1.74*   CALCIUM 8.6*  --   --   --   --   --  7.4*   < > = values in this interval not displayed.    PT/INR:  Recent Labs    06/27/18 1259  LABPROT 19.2*  INR 1.64   ABG    Component Value Date/Time   PHART 7.319 (L) 06/27/2018 1949   HCO3 20.7 06/27/2018 1949   TCO2 22 06/27/2018 1949   ACIDBASEDEF 5.0 (H) 06/27/2018 1949   O2SAT 95.0 06/27/2018 1949   CBG (last 3)  Recent Labs    06/28/18 0008 06/28/18 0422 06/28/18 0753  GLUCAP 122* 107* 115*    Assessment/Plan: S/P Procedure(s) (LRB): CORONARY ARTERY BYPASS GRAFTING (CABG), ON PUMP, TIMES FOUR, USING LEFT INTERNAL MAMMARY ARTERY AND ENDOSCOPICALLY HARVESTED LEFT GREATER SAPHENOUS VEIN (N/A) TRANSESOPHAGEAL ECHOCARDIOGRAM (TEE) (N/A) -  POD # 1 Doing well CV- Good hemodynamics with index of 3, low filling pressures  ECG shows ST elevation in lead I- suspect early repolarization but may have some localized pericardial irritation. No evidence of ischemia.  RESP- IS for basilar atelectasis  RENAL- creatinine 1.74, baseline is 2.09- follow,   [place]  ENDO- CBG well controlled  Anemia acute secondary to ABL on chronic- transfused yesterday, Hct 25 this AM  SCD + enoxaparin for DVT prophylaxis  Cardiac rehab  LOS: 7 days    Kevin [name] 06/28/2018

## 2018-06-28 NOTE — Discharge Summary (Addendum)
Physician Discharge Summary  Patient ID: Kevin [name] MRN: [id number] DOB/AGE: 09-04-1940 78 y.o.  Admit date: 06/19/2018 Discharge date: 07/04/2018  Admission Diagnoses:  Patient Active Problem List   Diagnosis Date Noted  . Coronary artery disease involving native heart without angina pectoris   . Unstable angina ([place])   . Ischemic chest pain ([place]) 06/19/2018  . Atypical chest pain 06/19/2018  . Constipation 09/19/2017  . Lumbar spondylosis 03/17/2017  . PAD (peripheral artery disease) ([place]) 01/08/2016  . Diastolic CHF ([place]) 44/81/8563  . CAP (community acquired pneumonia) 03/17/2015  . CKD (chronic kidney disease), stage III ([place]) 01/27/2014  . Family history of malignant neoplasm of gastrointestinal tract 05/15/2013  . Personal history of colonic polyps 05/15/2013  . AAA (abdominal aortic aneurysm, ruptured) ([place]) 01/17/2013  . Inguinal hernia 05/01/2012  . Carotid artery stenosis s/p L carotid endarterectomy 01/12/2012  . History of CVA (cerebrovascular accident) 11/17/2011  . Chronic pain syndrome 12/23/2009  . BURSITIS, HIP 12/21/2009  . Essential tremor 10/05/2009  . ACTINIC KERATOSIS, EAR, LEFT 03/09/2009  . UNSPECIFIED ANEMIA 12/05/2008  . 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 BPH (benign prostatic hyperplasia) 06/04/2007  . Fasting hyperglycemia 02/05/2007  . Hyperlipidemia 01/03/2007  . Essential hypertension 01/03/2007   Discharge Diagnoses:  Patient Active Problem List   Diagnosis Date Noted  . S/P CABG x 4 06/27/2018  . Coronary artery disease involving native heart without angina pectoris   . Unstable angina ([place])   . Ischemic chest pain ([place]) 06/19/2018  . Atypical chest pain 06/19/2018  . Constipation 09/19/2017  . Lumbar spondylosis 03/17/2017  . PAD (peripheral artery disease) ([place]) 01/08/2016  . Diastolic CHF ([place]) 14/97/0263  . CAP (community acquired pneumonia) 03/17/2015  . CKD (chronic kidney  disease), stage III ([place]) 01/27/2014  . Family history of malignant neoplasm of gastrointestinal tract 05/15/2013  . Personal history of colonic polyps 05/15/2013  . AAA (abdominal aortic aneurysm, ruptured) ([place]) 01/17/2013  . Inguinal hernia 05/01/2012  . Carotid artery stenosis s/p L carotid endarterectomy 01/12/2012  . History of CVA (cerebrovascular accident) 11/17/2011  . Chronic pain syndrome 12/23/2009  . BURSITIS, HIP 12/21/2009  . Essential tremor 10/05/2009  . ACTINIC KERATOSIS, EAR, LEFT 03/09/2009  . UNSPECIFIED ANEMIA 12/05/2008  . 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 BPH (benign prostatic hyperplasia) 06/04/2007  . Fasting hyperglycemia 02/05/2007  . Hyperlipidemia 01/03/2007  . Essential hypertension 01/03/2007   Discharged Condition: good  History of Present Illness:  Kevin [name] is a 78 yo white male with PMH of HTN, Hyperlipidemia, PAD, ECCOD S/P Endarterectomy, TIA, ruptured AAA, compensated chronic diastolic heart failure, Stage IV CKD and RA.  The patient had developed complaints of substernal/epigastric pain for the past couple of weeks.  He described the discomfort as a pressure sensation which occurs at rest.  The patient did not experience exertional symptoms.  These episodes have lasted 30-45 minutes.  He contacted his PCP with these complaints and he was instructed to present to the ED.  Workup in the ED consisted of EKG with no ischemic changes and the Troponin levels were negative.  He was admitted for further observation.    Hospital Course:   The patient has experienced episodes of chest pain during admission.  It was felt cardiac catheterization would be indicated.  This was performed on 06/21/2018 and showed multivessel CAD.  It was felt coronary bypass grafting would be indicated and TCTS consult was requested.  He was evaluated by Dr. Roxan Hockey who was in agreement coronary bypass grafting would be indicated.   The risks and benefits of the procedure would explained to the patient and he was agreeable to proceed.  The patient was taken to the operating room and underwent CABG x 4 utilizing LIMA to LAD, SVG to Diagonal 1, SVG to OM2, and SVG to PDA.  He also underwent endoscopic harvest of greater saphenous vein from his left leg.  He tolerated the procedure without difficulty and was taken to the SICU in stable condition.  The patient was extubated the evening of surgery.  During his stay in the SICU the patients chest tubes and arterial lines were removed without difficulty.  His creatinine level is elevated at 1.74, however his baseline level is 2.09.  He was transfused a unit of packed cells for post operative blood loss anemia.  CXR showed a small left pleural effusion.  He was diuresed for this, with repeat CXR showing minimal left pleural effusion and atelectasis.  He remains in NSR and was medically stable for transfer to the progressive care unit on 06/29/2018.  He continues to make progress. He developed atrial fibrillation.  He was treated with IV amiodarone.  He converted to NSR but unfortunately he continued to have episodic PAF.  He was transitioned to oral Amiodarone.  His lopressor dose was titrated for better HR control.  He developed prolongation of his Qtc over 600 at times.  EKG obtained showed NSR with RBBB.  His Amiodarone was discontinued and Cardiology consult was obtained.  They reviewed his EKG with EP and they did not feel it was QT prolongation.  They also felt it was okay to resume Amiodarone.  They recommended restarting at 200 mg BID and they will taper in the office.  He was started on coumadin for this.  His INR is 1.38.  His coumadin dose is 5 mg with a goal INR range of 2.0-2.5.  His pacing wires have been removed without difficulty.  He is ambulating independently.  He is tolerating a heart healthy diet.  His incisions are healing without evidence of infection.  He is medically stable for  discharge home today.    Significant Diagnostic Studies: angiography:    Prox RCA lesion is 100% stenosed. Left to right collaterals.  2nd Mrg lesion is 80% stenosed.  Ost 1st Diag to 1st Diag lesion is 90% stenosed.  Prox LAD lesion is 80% stenosed.  LV end diastolic pressure is normal.  There is no aortic valve stenosis.   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  Severe disease in a large diagonal.  Eccentric lesion in an OM, best seen in the AP caudal view.  Chronically occluded dominant RCA.   Treatments: surgery:   1.  Median sternotomy, extracorporeal circulation, coronary artery bypass grafting x4. 2.  Left internal mammary artery to left anterior descending. 3.  Saphenous vein graft to first diagonal. 4.  Saphenous vein graft to obtuse marginal 2. 5.  Saphenous vein graft to posterior descending. 6.  Endoscopic vein harvest, left leg.  Discharge Exam: Blood pressure (!) 155/71, pulse 67, temperature 99 F (37.2 C), temperature source Oral, resp. rate 17, height 5\' 7"  (1.702 m), weight 79.2 kg, SpO2 91 %.  General appearance: alert, cooperative and no distress Heart: regular rate and rhythm Lungs: clear to auscultation bilaterally Abdomen: soft, non-tender; bowel sounds normal; no masses,  no organomegaly Extremities: edema none present Wound: clean and dry  Discharge disposition: 01-Home or Self Care  Discharge Medications:  The patient has been discharged on:   1.Beta Blocker:  Yes [ X  ]                              No   [   ]                              If No, reason:  2.Ace Inhibitor/ARB: Yes [   ]                                     No  [  X  ]                                     If No, reason: CKD 3.Statin:   Yes [ X  ]                  No  [   ]                  If No, reason:  4.Ecasa:  Yes  [  X ]                  No   [   ]                  If No, reason:     Discharge Instructions    Amb Referral to  Cardiac Rehabilitation   Complete by:  As directed    Diagnosis:  CABG   CABG X ___:  4     Allergies as of 07/04/2018   No Known Allergies     Medication List    STOP taking these medications   amLODipine 10 MG tablet Commonly known as:  NORVASC   clopidogrel 75 MG tablet Commonly known as:  PLAVIX   propranolol 40 MG tablet Commonly known as:  INDERAL     TAKE these medications   amiodarone 200 MG tablet Commonly known as:  PACERONE Take 1 tablet (200 mg total) by mouth 2 (two) times daily. For 10 days, then decrease to 200 mg daily   aspirin 81 MG EC tablet Take 1 tablet (81 mg total) by mouth daily. Start taking on:  July 05, 2018   atorvastatin 20 MG tablet Commonly known as:  LIPITOR TAKE 1 TABLET EVERY DAY   BIOTIN PO Take 1 tablet by mouth daily.   diazepam 5 MG tablet Commonly known as:  VALIUM TAKE 1/2 TO 1 TABLET EVERY 8 (EIGHT) HOURS AS NEEDED FOR MUSCLE SPASMS OR SEDATION. What changed:  See the new instructions.   fenofibrate 160 MG tablet TAKE 1 TABLET EVERY DAY   furosemide 20 MG tablet Commonly known as:  LASIX TAKE 1 TABLET EVERY OTHER DAY   gabapentin 300 MG capsule Commonly known as:  NEURONTIN TAKE 1 CAPSULE TWICE DAILY   Melatonin 10 MG Tabs Take 10 mg by mouth at bedtime.   metoprolol tartrate 50 MG tablet Commonly known as:  LOPRESSOR Take 1 tablet (50 mg total) by mouth 2 (two) times daily.   oxyCODONE-acetaminophen 7.5-325 MG tablet Commonly known as:  PERCOCET Take 1 tablet  by mouth every 4 (four) hours as needed for severe pain. What changed:    when to take this  reasons to take this  additional instructions   tamsulosin 0.4 MG Caps capsule Commonly known as:  FLOMAX TAKE 1 CAPSULE EVERY DAY What changed:  when to take this   tiZANidine 4 MG tablet Commonly known as:  ZANAFLEX TAKE 1 TABLET EVERY 6 HOURS AS NEEDED FOR MUSCLE SPASM(S) What changed:  See the new instructions.   triamcinolone cream 0.1  % Commonly known as:  KENALOG APPLY 1 APPLICATION 2 TIMES DAILY TOPICALLY FOR 7-10 DAYS What changed:  See the new instructions.   VENTOLIN HFA 108 (90 Base) MCG/ACT inhaler Generic drug:  albuterol Inhale 2 puffs into the lungs every 6 (six) hours as needed for wheezing or shortness of breath.   vitamin B-12 1000 MCG tablet Commonly known as:  CYANOCOBALAMIN Take 1,000 mcg by mouth daily.   Vitamin D-3 25 MCG (1000 UT) Caps Take 1,000 Units by mouth daily.   warfarin 5 MG tablet Commonly known as:  COUMADIN Take 1 tablet (5 mg total) by mouth daily at 6 PM.      Follow-up Information    Melrose Nakayama, MD Follow up on 07/31/2018.   Specialty:  Cardiothoracic Surgery Why:  Appointment is at 10:45, please get CXR at 10:15 at [facility] located on first floor of our office building Contact information: [place] [address] [place], [place], [place] Follow up on 07/12/2018.   Specialty:  Cardiology Why:  Appointment is at 10:00 Contact information: [place] [address] [phone]        [facility] Office Follow up on 07/06/2018.   Specialty:  Cardiology Why:  Appointment is at 10:30 Contact information: [address] [place] Follow up.   Specialty:  Home Health Services Why:  [facility] RN and physical therapy arranged- they will call you to set up home visits Contact information: [place] [address] [phone]           Signed:  Ellwood Handler PA-C 07/04/2018, 1:53 PM

## 2018-06-28 NOTE — Telephone Encounter (Signed)
Not in the office today. Please ask patient what concerns there are- we may need to schedule a visit.

## 2018-06-28 NOTE — Telephone Encounter (Signed)
Spoke to Kevin [name] and inform her that Dr. [name] was out of the office today. Pt verbalized understanding. I ask [name] if the call was regarding something emergent and she stated "no, I just wanted to speak to Dr. [name], I will call him back tomorrow."

## 2018-06-28 NOTE — Progress Notes (Deleted)
TCTS DAILY ICU PROGRESS NOTE                   [address]            [place] [address]          [phone]   1 Day Post-Op Procedure(s) (LRB): CORONARY ARTERY BYPASS GRAFTING (CABG), ON PUMP, TIMES FOUR, USING LEFT INTERNAL MAMMARY ARTERY AND ENDOSCOPICALLY HARVESTED LEFT GREATER SAPHENOUS VEIN (N/A) TRANSESOPHAGEAL ECHOCARDIOGRAM (TEE) (N/A)  Total Length of Stay:  LOS: 7 days   Subjective:  Patient has no specific complaints.  He states his pain is well controlled.  He denies N/V  Objective: Vital signs in last 24 hours: Temp:  [96.3 F (35.7 C)-100.6 F (38.1 C)] 99.3 F (37.4 C) (01/30 0700) Pulse Rate:  [79-90] 80 (01/30 0700) Cardiac Rhythm: A-V Sequential paced (01/30 0400) Resp:  [12-30] 15 (01/30 0700) BP: (85-126)/(55-77) 116/57 (01/30 0700) SpO2:  [91 %-100 %] 99 % (01/30 0700) Arterial Line BP: (103-158)/(36-62) 131/40 (01/30 0700) FiO2 (%):  [40 %-50 %] 40 % (01/29 1800) Weight:  [82.9 kg] 82.9 kg (01/30 0500)  Filed Weights   06/26/18 1634 06/27/18 0451 06/28/18 0500  Weight: 80.8 kg 78.1 kg 82.9 kg    Weight change: 2.1 kg   Hemodynamic parameters for last 24 hours: PAP: (17-36)/(2-16) 17/3 CO:  [3 L/min-6.3 L/min] 5.6 L/min CI:  [1.6 L/min/m2-3.3 L/min/m2] 3 L/min/m2  Intake/Output from previous day: 01/29 0701 - 01/30 0700 In: 9628.3 [P.O.:600; I.V.:3191.3; Blood:991; IV Piggyback:1823.9] Out: 3270 [Urine:1740; Emesis/NG output:110; Blood:850; Chest Tube:570]  Current Meds: Scheduled Meds: . acetaminophen  1,000 mg Oral Q6H   Or  . acetaminophen (TYLENOL) oral liquid 160 mg/5 mL  1,000 mg Per Tube Q6H  . aspirin EC  325 mg Oral Daily   Or  . aspirin  324 mg Per Tube Daily  . atorvastatin  20 mg Oral Daily  . bisacodyl  10 mg Oral Daily   Or  . bisacodyl  10 mg Rectal Daily  . Chlorhexidine Gluconate Cloth  6 each Topical Daily  . docusate sodium  200 mg Oral Daily  . enoxaparin (LOVENOX) injection  40 mg  Subcutaneous QHS  . gabapentin  300 mg Oral BID  . insulin aspart  0-24 Units Subcutaneous Q4H  . insulin aspart  0-24 Units Subcutaneous Q4H  . metoprolol tartrate  12.5 mg Oral BID   Or  . metoprolol tartrate  12.5 mg Per Tube BID  . [START ON 06/29/2018] pantoprazole  40 mg Oral Daily  . sodium chloride flush  10-40 mL Intracatheter Q12H  . sodium chloride flush  3 mL Intravenous Q12H  . tamsulosin  0.4 mg Oral QHS   Continuous Infusions: . sodium chloride 10 mL/hr at 06/28/18 0700  . sodium chloride    . sodium chloride 10 mL/hr at 06/27/18 2100  . albumin human 12.5 g (06/28/18 0130)  . cefUROXime (ZINACEF)  IV Stopped (06/28/18 0332)  . dexmedetomidine (PRECEDEX) IV infusion 0.1 mcg/kg/hr (06/28/18 0700)  . lactated ringers    . lactated ringers    . lactated ringers    . nitroGLYCERIN    . phenylephrine (NEO-SYNEPHRINE) Adult infusion Stopped (06/27/18 1310)   PRN Meds:.sodium chloride, albumin human, lactated ringers, metoprolol tartrate, midazolam, morphine injection, ondansetron (ZOFRAN) IV, oxyCODONE, sodium chloride flush, sodium chloride flush, traMADol  General appearance: alert, cooperative and no distress Heart: regular rate and rhythm Lungs: clear to auscultation bilaterally Abdomen: soft, non-tender; bowel  sounds normal; no masses,  no organomegaly Extremities: edema trace Wound: clean and dry  Lab Results: CBC: Recent Labs    06/27/18 1923 06/27/18 1949 06/28/18 0420  WBC 8.2  --  8.5  HGB 9.6* 9.9* 8.8*  HCT 29.1* 29.0* 25.9*  PLT 130*  --  130*   BMET:  Recent Labs    06/27/18 0015  06/27/18 1204  06/27/18 1923 06/27/18 1949 06/28/18 0420  NA 136   < > 141   < >  --  139 137  K 4.0   < > 4.6   < >  --  4.4 4.4  CL 103  --   --   --   --   --  107  CO2 24  --   --   --   --   --  22  GLUCOSE 118*   < > 118*  --   --   --  112*  BUN 32*  --   --   --   --   --  21  CREATININE 2.16*  --   --   --  1.73*  --  1.74*  CALCIUM 8.6*  --   --    --   --   --  7.4*   < > = values in this interval not displayed.    CMET: Lab Results  Component Value Date   WBC 8.5 06/28/2018   HGB 8.8 (L) 06/28/2018   HCT 25.9 (L) 06/28/2018   PLT 130 (L) 06/28/2018   GLUCOSE 112 (H) 06/28/2018   CHOL 113 06/20/2018   TRIG 177 (H) 06/20/2018   HDL 23 (L) 06/20/2018   LDLDIRECT 134.9 12/26/2012   LDLCALC 55 06/20/2018   ALT 16 06/26/2018   AST 23 06/26/2018   NA 137 06/28/2018   K 4.4 06/28/2018   CL 107 06/28/2018   CREATININE 1.74 (H) 06/28/2018   BUN 21 06/28/2018   CO2 22 06/28/2018   TSH 0.96 04/17/2017   PSA 1.59 12/21/2015   INR 1.64 06/27/2018   HGBA1C 6.0 (H) 06/20/2018      PT/INR:  Recent Labs    06/27/18 1259  LABPROT 19.2*  INR 1.64   Radiology: Dg Chest Port 1 View  Result Date: 06/27/2018 CLINICAL DATA:  Follow-up CABG EXAM: PORTABLE CHEST 1 VIEW COMPARISON:  06/19/2018 FINDINGS: Previous median sternotomy and CABG. Endotracheal tube tip is 5 cm above the carina. Nasogastric tube enters the stomach. Swan-Ganz catheter tip is in the main pulmonary artery. Mediastinal drains and left chest tube ir in place. No pneumothorax. No pulmonary edema. Mild bibasilar atelectasis. IMPRESSION: Good appearance following CABG. Lines and tubes well positioned. Mild bibasilar atelectasis. Electronically Signed   By: Nelson Chimes M.D.   On: 06/27/2018 14:14     Assessment/Plan: S/P Procedure(s) (LRB): CORONARY ARTERY BYPASS GRAFTING (CABG), ON PUMP, TIMES FOUR, USING LEFT INTERNAL MAMMARY ARTERY AND ENDOSCOPICALLY HARVESTED LEFT GREATER SAPHENOUS VEIN (N/A) TRANSESOPHAGEAL ECHOCARDIOGRAM (TEE) (N/A)  1. CV- Sinus Bradycardia under pacer- off all drips, BP is controlled- Lopressor ordered 2. Pulm- no significant effusion/pneumothorax, + atelectasis, will continue IS, will d/c chest tubes today 3. Renal- baseline CKD, creatinine is stable today at 1.74, continue to monitor, weight is stable, no lasix at this time 4. Expected post  operative blood loss anemia, Hgb 8.8 after transfusion yesterday 5. CBGs controlled,off insulin drip, will start SSIP 6. Dispo- patient stable, Sinus Bradycardia, creatinine stable, Hgb improved after transfusion, POD #1 progression orders  Junie Panning Damante Spragg 06/28/2018 7:40 AM

## 2018-06-29 ENCOUNTER — Inpatient Hospital Stay (HOSPITAL_COMMUNITY): Payer: Medicare HMO

## 2018-06-29 ENCOUNTER — Telehealth: Payer: Self-pay | Admitting: Family Medicine

## 2018-06-29 LAB — BASIC METABOLIC PANEL
Anion gap: 6 (ref 5–15)
BUN: 21 mg/dL (ref 8–23)
CO2: 23 mmol/L (ref 22–32)
Calcium: 7.9 mg/dL — ABNORMAL LOW (ref 8.9–10.3)
Chloride: 109 mmol/L (ref 98–111)
Creatinine, Ser: 1.89 mg/dL — ABNORMAL HIGH (ref 0.61–1.24)
GFR calc non Af Amer: 33 mL/min — ABNORMAL LOW (ref >60)
GFR, EST AFRICAN AMERICAN: 38 mL/min — AB (ref >60)
Glucose, Bld: 108 mg/dL — ABNORMAL HIGH (ref 70–99)
Potassium: 4.6 mmol/L (ref 3.5–5.1)
Sodium: 138 mmol/L (ref 135–145)

## 2018-06-29 LAB — CBC
HCT: 25.3 % — ABNORMAL LOW (ref 39.0–52.0)
Hemoglobin: 8.1 g/dL — ABNORMAL LOW (ref 13.0–17.0)
MCH: 30 pg (ref 26.0–34.0)
MCHC: 32 g/dL (ref 30.0–36.0)
MCV: 93.7 fL (ref 80.0–100.0)
NRBC: 0 % (ref 0.0–0.2)
Platelets: 117 10*3/uL — ABNORMAL LOW (ref 150–400)
RBC: 2.7 MIL/uL — ABNORMAL LOW (ref 4.22–5.81)
RDW: 13.9 % (ref 11.5–15.5)
WBC: 8.6 10*3/uL (ref 4.0–10.5)

## 2018-06-29 LAB — GLUCOSE, CAPILLARY
Glucose-Capillary: 111 mg/dL — ABNORMAL HIGH (ref 70–99)
Glucose-Capillary: 113 mg/dL — ABNORMAL HIGH (ref 70–99)
Glucose-Capillary: 119 mg/dL — ABNORMAL HIGH (ref 70–99)

## 2018-06-29 MED ORDER — SODIUM CHLORIDE 0.9% FLUSH: 3.0000 mL | INTRAVENOUS | Status: DC | PRN

## 2018-06-29 MED ORDER — SODIUM CHLORIDE 0.9 % IV SOLN: 250.0000 mL | INTRAVENOUS | Status: DC | PRN

## 2018-06-29 MED ORDER — AMIODARONE HCL IN DEXTROSE 360-4.14 MG/200ML-% IV SOLN
30.0000 mg/h | INTRAVENOUS | Status: AC
  Administered 2018-06-29 – 2018-06-30 (×2): 30 mg/h via INTRAVENOUS
  Filled 2018-06-29 (×4): qty 200

## 2018-06-29 MED ORDER — SODIUM CHLORIDE 0.9% FLUSH
3.0000 mL | Freq: Two times a day (BID) | INTRAVENOUS | Status: DC
  Administered 2018-06-29 – 2018-07-04 (×9): 3 mL via INTRAVENOUS

## 2018-06-29 MED ORDER — FENOFIBRATE 160 MG PO TABS
160.0000 mg | ORAL_TABLET | Freq: Every day | ORAL | Status: DC
  Administered 2018-06-29 – 2018-07-04 (×6): 160 mg via ORAL
  Filled 2018-06-29 (×6): qty 1

## 2018-06-29 MED ORDER — AMIODARONE LOAD VIA INFUSION
150.0000 mg | Freq: Once | INTRAVENOUS | Status: AC
  Administered 2018-06-29: 150 mg via INTRAVENOUS
  Filled 2018-06-29: qty 83.34

## 2018-06-29 MED ORDER — FUROSEMIDE 40 MG PO TABS
40.0000 mg | ORAL_TABLET | Freq: Every day | ORAL | Status: DC
  Administered 2018-06-29: 40 mg via ORAL
  Filled 2018-06-29: qty 1

## 2018-06-29 MED ORDER — AMIODARONE HCL IN DEXTROSE 360-4.14 MG/200ML-% IV SOLN
60.0000 mg/h | INTRAVENOUS | Status: AC
  Administered 2018-06-29 (×2): 60 mg/h via INTRAVENOUS
  Filled 2018-06-29: qty 200

## 2018-06-29 MED ORDER — MAGNESIUM HYDROXIDE 400 MG/5ML PO SUSP: 30.0000 mL | Freq: Every day | ORAL | Status: DC | PRN

## 2018-06-29 MED ORDER — ALUM & MAG HYDROXIDE-SIMETH 200-200-20 MG/5ML PO SUSP: 15.0000 mL | Freq: Four times a day (QID) | ORAL | Status: DC | PRN

## 2018-06-29 MED ORDER — AMIODARONE HCL IN DEXTROSE 360-4.14 MG/200ML-% IV SOLN
INTRAVENOUS | Status: AC
  Filled 2018-06-29: qty 200

## 2018-06-29 MED ORDER — MOVING RIGHT ALONG BOOK
Freq: Once | Status: AC
  Administered 2018-06-29: 13:00:00
  Filled 2018-06-29: qty 1

## 2018-06-29 MED ORDER — METOPROLOL TARTRATE 5 MG/5ML IV SOLN: 2.5000 mg | INTRAVENOUS | Status: DC | PRN

## 2018-06-29 NOTE — Progress Notes (Signed)
1509 Cardiac rehab RN walking pt, pt with HR in 150s. EKG showed A. Fib. Pt asymptomatic. 77 Dr. Roxan Hockey paged, orders for IV amiodarone with bolus.  63 MD in room, changed pacer to VVI @ 50 1601 Pt converted to NSR rate of [place], RN

## 2018-06-29 NOTE — Telephone Encounter (Signed)
See note  Copied from [place] [phone]. Topic: General - Other >> Jun 29, 2018 12:37 PM Sheran Luz wrote: Reason for CRM: Patient's wife, [name], calling to request hospital bed for home on behalf of patient through [facility] [name] states that patient will be discharged from hospital on Monday so she inquired if bed could be ordered before then. [name] states that she was advised by surgeon that order would have to be put in by PCP. Please advise.

## 2018-06-29 NOTE — Progress Notes (Signed)
  Amiodaron e Drug - Drug Interaction Consult Note  Current potential drug-drug interactions include:  Atorvastatin 20 mg PO daily  Enoxaparin 30 mg [place] daily  Metoprolol 12.5 mg PO BID  Furosemide 40 mg PO daily  Recommendations:  Monitor for muscle pain or weakness, bleeding, bradycardia  Replace potassium as needed to prevent hypokalemia  Amiodarone is metabolized by the cytochrome P450 system and therefore has the potential to cause many drug interactions. Amiodarone has an average plasma half-life of 50 days (range 20 to 100 days).   There is potential for drug interactions to occur several weeks or months after stopping treatment and the onset of drug interactions may be slow after initiating amiodarone.   [x]  Statins: Increased risk of myopathy. Simvastatin- restrict dose to 20mg  daily. Other statins: counsel patients to report any muscle pain or weakness immediately.  [x]  Anticoagulants: Amiodarone can increase anticoagulant effect. Consider warfarin dose reduction. Patients should be monitored closely and the dose of anticoagulant altered accordingly, remembering that amiodarone levels take several weeks to stabilize.  []  Antiepileptics: Amiodarone can increase plasma concentration of phenytoin, the dose should be reduced. Note that small changes in phenytoin dose can result in large changes in levels. Monitor patient and counsel on signs of toxicity.  [x]  Beta blockers: increased risk of bradycardia, AV block and myocardial depression. Sotalol - avoid concomitant use.  []   Calcium channel blockers (diltiazem and verapamil): increased risk of bradycardia, AV block and myocardial depression.  []   Cyclosporine: Amiodarone increases levels of cyclosporine. Reduced dose of cyclosporine is recommended.  []  Digoxin dose should be halved when amiodarone is started.  [x]  Diuretics: increased risk of cardiotoxicity if hypokalemia occurs.  []  Oral hypoglycemic agents (glyburide,  glipizide, glimepiride): increased risk of hypoglycemia. Patient's glucose levels should be monitored closely when initiating amiodarone therapy.   []  Drugs that prolong the QT interval:  Torsades de pointes risk may be increased with concurrent use - avoid if possible.  Monitor QTc, also keep magnesium/potassium WNL if concurrent therapy can't be avoided. Marland Kitchen Antibiotics: e.g. fluoroquinolones, erythromycin. . Antiarrhythmics: e.g. quinidine, procainamide, disopyramide, sotalol. . Antipsychotics: e.g. phenothiazines, haloperidol.  . Lithium, tricyclic antidepressants, and methadone.  Thank You,   Vertis Kelch, PharmD PGY1 Pharmacy Resident Phone [phone] 06/29/2018       3:28 PM

## 2018-06-29 NOTE — Progress Notes (Signed)
2 Days Post-Op Procedure(s) (LRB): CORONARY ARTERY BYPASS GRAFTING (CABG), ON PUMP, TIMES FOUR, USING LEFT INTERNAL MAMMARY ARTERY AND ENDOSCOPICALLY HARVESTED LEFT GREATER SAPHENOUS VEIN (N/A) TRANSESOPHAGEAL ECHOCARDIOGRAM (TEE) (N/A) Subjective: Some mild incisional pain, denies nausea, shortness of breath  Objective: Vital signs in last 24 hours: Temp:  [97.7 F (36.5 C)-99 F (37.2 C)] 97.7 F (36.5 C) (01/31 0400) Pulse Rate:  [70-80] 70 (01/31 0700) Cardiac Rhythm: Atrial paced (01/31 0000) Resp:  [14-23] 14 (01/31 0700) BP: (112-152)/(53-66) 120/55 (01/31 0700) SpO2:  [94 %-98 %] 96 % (01/31 0700) Arterial Line BP: (129-157)/(43-48) 157/46 (01/30 1000) Weight:  [78.7 kg] 78.7 kg (01/31 0600)  Hemodynamic parameters for last 24 hours: PAP: (20)/(7) 20/7  Intake/Output from previous day: 01/30 0701 - 01/31 0700 In: 999 [P.O.:540; I.V.:250; IV Piggyback:209] Out: 985 [Urine:925; Chest Tube:60] Intake/Output this shift: No intake/output data recorded.  General appearance: alert, cooperative and no distress Neurologic: intact Heart: regular rate and rhythm Lungs: diminished breath sounds bibasilar and L>R Abdomen: normal findings: soft, non-tender Wound clean and dry  Lab Results: Recent Labs    06/28/18 1700 06/29/18 0320  WBC 9.1 8.6  HGB 8.7* 8.1*  HCT 26.8* 25.3*  PLT 120* 117*   BMET:  Recent Labs    06/28/18 1700 06/29/18 0320  NA 133* 138  K 4.3 4.6  CL 106 109  CO2 20* 23  GLUCOSE 127* 108*  BUN 19 21  CREATININE 1.87* 1.89*  CALCIUM 7.9* 7.9*    PT/INR:  Recent Labs    06/27/18 1259  LABPROT 19.2*  INR 1.64   ABG    Component Value Date/Time   PHART 7.319 (L) 06/27/2018 1949   HCO3 20.7 06/27/2018 1949   TCO2 22 06/27/2018 1949   ACIDBASEDEF 5.0 (H) 06/27/2018 1949   O2SAT 95.0 06/27/2018 1949   CBG (last 3)  Recent Labs    06/28/18 2007 06/29/18 0010 06/29/18 0406  GLUCAP 134* 111* 113*    Assessment/Plan: S/P  Procedure(s) (LRB): CORONARY ARTERY BYPASS GRAFTING (CABG), ON PUMP, TIMES FOUR, USING LEFT INTERNAL MAMMARY ARTERY AND ENDOSCOPICALLY HARVESTED LEFT GREATER SAPHENOUS VEIN (N/A) TRANSESOPHAGEAL ECHOCARDIOGRAM (TEE) (N/A) Plan for transfer to step-down: see transfer orders  CV- in SR in mod 60s- will change pacer to backup mode  ASA, beta blocker, statin, no ACE-I due to CKD RESP- continue IS for basilar atelectasis  Possible small left effusion- will diurese- check PA and lateral CXR in Am 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

## 2018-06-29 NOTE — Progress Notes (Signed)
Pt received from [place]. VSS. Pacer box attached  w/ a rate of 50. CHG complete. Pt and family oriented to room and unit. Call light in reach. Will continue to monitor.  Clyde Canterbury, RN

## 2018-06-29 NOTE — Telephone Encounter (Signed)
That is not accurate-order can be placed by any physician for home health needs.  I am certain they would prefer for [place] to put in the order though.  I believe if we order this he will need to be seen within 30 days of hospital discharge.  I am okay with order

## 2018-06-29 NOTE — Telephone Encounter (Signed)
Spoke with wife- she just wanted to give a hospital update

## 2018-06-29 NOTE — Progress Notes (Signed)
      [address]       [place] [address]             [phone]      Went into atrial fib with RVR in 150s during hsi walk.  Now back in bed. Aware of irregular rate/ rhythm. No CP or SOB  Will treat with amiodarone per protocol  PRN IV lopressor if needed  Will change pacer to VVI @ 50bpm for back up given his previous bradycardia  Remo Lipps C. Roxan Hockey, MD [facility] [phone]

## 2018-06-29 NOTE — Progress Notes (Signed)
CARDIAC REHAB PHASE I   PRE:  Rate/Rhythm: 75 SR  BP:  Supine: 130/58  Sitting:   Standing:    SaO2: 96%RA  MODE:  Ambulation: 350 ft   POST:  Rate/Rhythm: 87 SR  Then to 155 afib  BP:  Supine:   Sitting: 121/72, 120/65  Standing:    SaO2: 97%RA 1500-1522 Pt walked 350 ft on RA with gait belt use and rolling walker and asst x 1. Encouraged to stay close to walker. Tolerated well until he went into a fib. Pt remained in NSR until I got him settled in bed and he suddenly went into afib with 155. Encouraged pt to take deep breaths and relax. HR remained elevated and RN in . Retook BP and 120/65. Pt asymptomatic except he had a little chest pressure.  RN in room and aware.    Graylon Good, RN BSN  06/29/2018 3:18 PM

## 2018-06-30 ENCOUNTER — Inpatient Hospital Stay (HOSPITAL_COMMUNITY): Payer: Medicare HMO

## 2018-06-30 DIAGNOSIS — I480 Paroxysmal atrial fibrillation: Secondary | ICD-10-CM

## 2018-06-30 LAB — BPAM RBC
BLOOD PRODUCT EXPIRATION DATE: 202002032359
Blood Product Expiration Date: 202002142359
Blood Product Expiration Date: 202002142359
Blood Product Expiration Date: 202002172359
ISSUE DATE / TIME: 202001290818
ISSUE DATE / TIME: 202001291346
Unit Type and Rh: 6200
Unit Type and Rh: 6200
Unit Type and Rh: 8400
Unit Type and Rh: 8400

## 2018-06-30 LAB — MAGNESIUM: Magnesium: 2.3 mg/dL (ref 1.7–2.4)

## 2018-06-30 LAB — TYPE AND SCREEN
ABO/RH(D): AB POS
Antibody Screen: NEGATIVE
UNIT DIVISION: 0
UNIT DIVISION: 0
Unit division: 0
Unit division: 0

## 2018-06-30 LAB — CBC
HCT: 23.6 % — ABNORMAL LOW (ref 39.0–52.0)
Hemoglobin: 7.7 g/dL — ABNORMAL LOW (ref 13.0–17.0)
MCH: 29.7 pg (ref 26.0–34.0)
MCHC: 32.6 g/dL (ref 30.0–36.0)
MCV: 91.1 fL (ref 80.0–100.0)
PLATELETS: 131 10*3/uL — AB (ref 150–400)
RBC: 2.59 MIL/uL — ABNORMAL LOW (ref 4.22–5.81)
RDW: 13.6 % (ref 11.5–15.5)
WBC: 7.8 10*3/uL (ref 4.0–10.5)
nRBC: 0 % (ref 0.0–0.2)

## 2018-06-30 LAB — BASIC METABOLIC PANEL
Anion gap: 8 (ref 5–15)
BUN: 28 mg/dL — ABNORMAL HIGH (ref 8–23)
CO2: 23 mmol/L (ref 22–32)
Calcium: 8.1 mg/dL — ABNORMAL LOW (ref 8.9–10.3)
Chloride: 102 mmol/L (ref 98–111)
Creatinine, Ser: 2.18 mg/dL — ABNORMAL HIGH (ref 0.61–1.24)
GFR calc Af Amer: 32 mL/min — ABNORMAL LOW (ref >60)
GFR calc non Af Amer: 28 mL/min — ABNORMAL LOW (ref >60)
GLUCOSE: 109 mg/dL — AB (ref 70–99)
Potassium: 4.3 mmol/L (ref 3.5–5.1)
Sodium: 133 mmol/L — ABNORMAL LOW (ref 135–145)

## 2018-06-30 MED ORDER — AMIODARONE HCL 200 MG PO TABS
400.0000 mg | ORAL_TABLET | Freq: Two times a day (BID) | ORAL | Status: DC
  Administered 2018-06-30 – 2018-07-03 (×8): 400 mg via ORAL
  Filled 2018-06-30 (×8): qty 2

## 2018-06-30 MED ORDER — ASPIRIN EC 81 MG PO TBEC
81.0000 mg | DELAYED_RELEASE_TABLET | Freq: Every day | ORAL | Status: DC
  Administered 2018-07-01 – 2018-07-04 (×4): 81 mg via ORAL
  Filled 2018-06-30 (×4): qty 1

## 2018-06-30 MED ORDER — METOPROLOL TARTRATE 25 MG PO TABS
25.0000 mg | ORAL_TABLET | Freq: Two times a day (BID) | ORAL | Status: DC
  Administered 2018-06-30 – 2018-07-01 (×3): 25 mg via ORAL
  Filled 2018-06-30 (×4): qty 1

## 2018-06-30 MED ORDER — SODIUM CHLORIDE 0.9 % IV SOLN: INTRAVENOUS | Status: DC

## 2018-06-30 MED ORDER — FUROSEMIDE 10 MG/ML IJ SOLN
40.0000 mg | Freq: Once | INTRAMUSCULAR | Status: AC
  Administered 2018-06-30: 40 mg via INTRAVENOUS
  Filled 2018-06-30: qty 4

## 2018-06-30 MED ORDER — FUROSEMIDE 20 MG PO TABS
20.0000 mg | ORAL_TABLET | Freq: Every day | ORAL | Status: DC
  Administered 2018-06-30 – 2018-07-04 (×5): 20 mg via ORAL
  Filled 2018-06-30 (×5): qty 1

## 2018-06-30 NOTE — Progress Notes (Signed)
CARDIAC REHAB PHASE I   PRE:  Rate/Rhythm: 23 SR  BP:  Sitting: 114/59        SaO2: 93% RA  MODE:  Ambulation: 100 ft   POST:  Rate/Rhythm: 89 to 150 AFib  BP:  Sitting: 160/82      SaO2: 91% RA  Pt ambulated 100 ft with walker and one assist. States mild SOB. Pt HR was SR and went into Afib when returned to room. Back to 63 SR when returned to bed continue to switch back and forth between SR and AFib. Call bell and phone within reach. Will continue to follow.   [id number]   [place], ACSM CEP 06/30/2018  11:09 AM

## 2018-06-30 NOTE — Progress Notes (Signed)
CARDIAC REHAB PHASE I   Went to walk with pt, pt states he needed more time to finish breakfast. Upon rechecking with pt, pt noted to be in Afib HR 110-130s. Will follow up later.  Rufina Falco, RN BSN 06/30/2018 8:55 AM

## 2018-06-30 NOTE — Plan of Care (Signed)
  Problem: Education: Goal: Knowledge of General Education information will improve Description Including pain rating scale, medication(s)/side effects and non-pharmacologic comfort measures Outcome: Progressing   Problem: Health Behavior/Discharge Planning: Goal: Ability to manage health-related needs will improve Outcome: Progressing   Problem: Clinical Measurements: Goal: Ability to maintain clinical measurements within normal limits will improve Outcome: Progressing Goal: Will remain free from infection Outcome: Progressing Goal: Diagnostic test results will improve Outcome: Progressing   Problem: Education: Goal: Will demonstrate proper wound care and an understanding of methods to prevent future damage Outcome: Progressing Goal: Knowledge of disease or condition will improve Outcome: Progressing

## 2018-06-30 NOTE — Progress Notes (Signed)
Progress Note  Patient Name: Kevin [name] Date of Encounter: 06/30/2018  Primary Cardiologist: Dorris Carnes, MD   Subjective   Postop day #3 , mild surgical pain. Unaware of episodes of atrial fibrillation occurring during our conversation.  Inpatient Medications    Scheduled Meds: . acetaminophen  1,000 mg Oral Q6H  . amiodarone  400 mg Oral BID  . [START ON 07/01/2018] aspirin EC  81 mg Oral Daily  . atorvastatin  20 mg Oral Daily  . bisacodyl  10 mg Oral Daily  . cholecalciferol  1,000 Units Oral Daily  . clopidogrel  75 mg Oral Daily  . docusate sodium  200 mg Oral Daily  . fenofibrate  160 mg Oral Daily  . furosemide  40 mg Intravenous Once  . furosemide  20 mg Oral Daily  . gabapentin  300 mg Oral BID  . Melatonin  9 mg Oral QHS  . metoprolol tartrate  25 mg Oral BID  . pantoprazole  40 mg Oral Daily  . sodium chloride flush  3 mL Intravenous Q12H  . tamsulosin  0.4 mg Oral QHS  . triamcinolone cream  1 application Topical BID  . vitamin B-12  1,000 mcg Oral Daily   Continuous Infusions: . sodium chloride    . amiodarone 30 mg/hr (06/30/18 0200)   PRN Meds: sodium chloride, albuterol, alum & mag hydroxide-simeth, magnesium hydroxide, metoprolol tartrate, ondansetron (ZOFRAN) IV, oxyCODONE, sodium chloride flush, traMADol   Vital Signs    Vitals:   06/29/18 2032 06/29/18 2120 06/30/18 0440 06/30/18 0844  BP: (!) 144/62  140/66 (!) 130/59  Pulse: 63 75 63 64  Resp: 20  16 20   Temp: 98.8 F (37.1 C)  98.4 F (36.9 C) 98.3 F (36.8 C)  TempSrc: Oral  Oral   SpO2: 94%  94% 95%  Weight:   83.5 kg   Height:        Intake/Output Summary (Last 24 hours) at 06/30/2018 1122 Last data filed at 06/30/2018 6503 Gross per 24 hour  Intake 842.56 ml  Output -  Net 842.56 ml   Last 3 Weights 06/30/2018 06/29/2018 06/28/2018  Weight (lbs) 184 lb 173 lb 8 oz 182 lb 12.2 oz  Weight (kg) 83.462 kg 78.7 kg 82.9 kg      Telemetry    Frequently recurrent episodes of  paroxysmal atrial fibrillation with rapid ventricular response in the 130s,  Occasional pacing. Mostly sinus rhythm.- Personally Reviewed  ECG    No new tracing- Personally Reviewed  Physical Exam  Appears well, comfortable lying fully flat in bed GEN: No acute distress.   Neck: No JVD Cardiac: RRR with ectopy, no murmurs, rubs, or gallops.  Respiratory: Clear to auscultation bilaterally. GI: Soft, nontender, non-distended  MS: No edema; No deformity. Neuro:  Nonfocal  Psych: Normal affect   Labs    Chemistry Recent Labs  Lab 06/26/18 0326  06/28/18 1700 06/29/18 0320 06/30/18 0412  NA 136   < > 133* 138 133*  K 4.0   < > 4.3 4.6 4.3  CL 102   < > 106 109 102  CO2 25   < > 20* 23 23  GLUCOSE 132*   < > 127* 108* 109*  BUN 33*   < > 19 21 28*  CREATININE 2.56*   < > 1.87* 1.89* 2.18*  CALCIUM 8.6*   < > 7.9* 7.9* 8.1*  PROT 5.6*  --   --   --   --  ALBUMIN 3.3*  --   --   --   --   AST 23  --   --   --   --   ALT 16  --   --   --   --   ALKPHOS 26*  --   --   --   --   BILITOT 0.5  --   --   --   --   GFRNONAA 23*   < > 33* 33* 28*  GFRAA 27*   < > 39* 38* 32*  ANIONGAP 9   < > 7 6 8    < > = values in this interval not displayed.     Hematology Recent Labs  Lab 06/28/18 1700 06/29/18 0320 06/30/18 0412  WBC 9.1 8.6 7.8  RBC 2.90* 2.70* 2.59*  HGB 8.7* 8.1* 7.7*  HCT 26.8* 25.3* 23.6*  MCV 92.4 93.7 91.1  MCH 30.0 30.0 29.7  MCHC 32.5 32.0 32.6  RDW 13.9 13.9 13.6  PLT 120* 117* 131*    Cardiac EnzymesNo results for input(s): TROPONINI in the last 168 hours. No results for input(s): TROPIPOC in the last 168 hours.   BNPNo results for input(s): BNP, PROBNP in the last 168 hours.   DDimer No results for input(s): DDIMER in the last 168 hours.   Radiology    Dg Chest 2 View  Result Date: 06/30/2018 CLINICAL DATA:  Patient status post CABG 06/27/2018. EXAM: CHEST - 2 VIEW COMPARISON:  Single-view of the chest 06/29/2018. FINDINGS: Six intact median  sternotomy wires are unchanged. Right IJ sheath has been removed. No pneumothorax. Moderate to moderately large left pleural effusion and basilar atelectasis have slightly worsened since the comparison examination. Small right effusion noted. No pulmonary edema. No acute or focal bony abnormality. IMPRESSION: Mild increase in a moderate to moderately large left pleural effusion and associated basilar atelectasis. Trace right pleural effusion. Electronically Signed   By: Inge Rise M.D.   On: 06/30/2018 09:30   Dg Chest Port 1 View  Result Date: 06/29/2018 CLINICAL DATA:  Cardiac surgery.  Sore chest. EXAM: PORTABLE CHEST 1 VIEW COMPARISON:  06/28/2018. FINDINGS: Interim removal of Swan-Ganz catheter, mediastinal drainage catheter, left chest tube. Right IJ sheath in stable position. Prior CABG. Stable cardiomegaly. Tiny right upper lung calcified nodule consistent granuloma. Persistent bibasilar atelectasis. New left base infiltrate and left-sided pleural effusion. No pneumothorax. IMPRESSION: 1. Interim removal of Swan-Ganz catheter, mediastinal drainage catheter, left chest tube. No pneumothorax. 2.  Prior CABG.  Stable cardiomegaly. 3. Persistent bibasilar atelectasis. New left base infiltrate and left-sided pleural effusion. Electronically Signed   By: Marcello Moores  Register   On: 06/29/2018 06:49    Cardiac Studies   June 21, 2018 cardiac catheterization  Prox RCA lesion is 100% stenosed. Left to right collaterals.  2nd Mrg lesion is 80% stenosed.  Ost 1st Diag to 1st Diag lesion is 90% stenosed.  Prox LAD lesion is 80% stenosed.  LV end diastolic pressure is normal.  There is no aortic valve stenosis.   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  Severe disease in a large diagonal.  Eccentric lesion in an OM, best seen in the AP caudal view.  Chronically occluded dominant RCA.   June 20, 2018 echocardiogram - Left ventricle: The cavity size was normal.  There was mild   concentric hypertrophy. Systolic function was normal. The   estimated ejection fraction was in the range of 60% to 65%. Wall   motion  was normal; there were no regional wall motion   abnormalities. Doppler parameters are consistent with abnormal   left ventricular relaxation (grade 1 diastolic dysfunction).   Doppler parameters are consistent with high ventricular filling   pressure. - Aortic valve: Transvalvular velocity was within the normal range.   There was no stenosis. There was no regurgitation. Valve area   (VTI): 2.17 cm^2. Valve area (Vmean): 2.45 cm^2. - Mitral valve: Mildly calcified annulus. Transvalvular velocity   was within the normal range. There was no evidence for stenosis.   There was no regurgitation. - Left atrium: The atrium was mildly dilated. - Right ventricle: The cavity size was normal. Wall thickness was   normal. Systolic function was normal. - Tricuspid valve: There was trivial regurgitation. - Pulmonary arteries: Systolic pressure was within the normal   range. PA peak pressure: 24 mm Hg (S).   Patient Profile     78 y.o. male day 3 status post CABG x4, frequent asymptomatic episodes of atrial fibrillation rapid ventricular response postop.  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  Assessment & Plan    1. Postop AFib: Continues to have frequent but brief episodes of asymptomatic A. fib with RVR.  Beta-blocker dose increased.  Would continue intravenous amiodarone today and plan to switch to oral amiodarone tomorrow.  If arrhythmia is recurrent throughout this hospitalization would send home with anticoagulation.  In view of advanced chronic kidney disease and thrombocytopenia, may be best to use warfarin rather than rapidly acting direct oral anticoagulants.  Discussed with Dr. Roxy Manns.  Reassess need for  long-term anticoagulation 3's after surgery.. 2. CAD s/p CABG: Other than arrhythmia, making a rapid and uneventful recovery postop.  Plan to continue aspirin and statin.  Stop clopidogrel if he starts oral anticoagulants. 3.  History of sinus bradycardia: Infrequently requiring temporary pacing postop.  Watch for worsening bradycardia with higher beta-blocker dose.     For questions or updates, please contact [place] Please consult [email or web address] for contact info under        Signed, Sanda Klein, MD  06/30/2018, 11:22 AM

## 2018-06-30 NOTE — Progress Notes (Addendum)
3 Days Post-Op Procedure(s) (LRB): CORONARY ARTERY BYPASS GRAFTING (CABG), ON PUMP, TIMES FOUR, USING LEFT INTERNAL MAMMARY ARTERY AND ENDOSCOPICALLY HARVESTED LEFT GREATER SAPHENOUS VEIN (N/A) TRANSESOPHAGEAL ECHOCARDIOGRAM (TEE) (N/A) Subjective: Awake and alert.  No new complaints.  He is walking with assistance.  Pain control is adequate.  Objective: Vital signs in last 24 hours: Temp:  [97.5 F (36.4 C)-98.8 F (37.1 C)] 98.4 F (36.9 C) (02/01 0440) Pulse Rate:  [63-148] 63 (02/01 0440) Cardiac Rhythm: Normal sinus rhythm (02/01 0720) Resp:  [16-25] 16 (02/01 0440) BP: (102-144)/(56-72) 140/66 (02/01 0440) SpO2:  [90 %-96 %] 94 % (02/01 0440) Weight:  [83.5 kg] 83.5 kg (02/01 0440)   Intake/Output from previous day: 01/31 0701 - 02/01 0700 In: 842.6 [P.O.:480; I.V.:362.6] Out: 350 [Urine:350] Intake/Output this shift: Total I/O In: 240 [P.O.:240] Out: -   General appearance: alert, cooperative and no distress Heart: Currently in a stable sinus rhythm.  He had another episode of atrial fibrillation around 4:30 this a.m. Lungs: clear to auscultation bilaterally Abdomen: soft, non-tender; bowel sounds normal; no masses,  no organomegaly Extremities: Mild lower extremity edema.  The EVH incisions are intact and dry.  Lab Results: Recent Labs    06/29/18 0320 06/30/18 0412  WBC 8.6 7.8  HGB 8.1* 7.7*  HCT 25.3* 23.6*  PLT 117* 131*   BMET:  Recent Labs    06/29/18 0320 06/30/18 0412  NA 138 133*  K 4.6 4.3  CL 109 102  CO2 23 23  GLUCOSE 108* 109*  BUN 21 28*  CREATININE 1.89* 2.18*  CALCIUM 7.9* 8.1*    PT/INR:  Recent Labs    06/27/18 1259  LABPROT 19.2*  INR 1.64   ABG    Component Value Date/Time   PHART 7.319 (L) 06/27/2018 1949   HCO3 20.7 06/27/2018 1949   TCO2 22 06/27/2018 1949   ACIDBASEDEF 5.0 (H) 06/27/2018 1949   O2SAT 95.0 06/27/2018 1949   CBG (last 3)  Recent Labs    06/29/18 0010 06/29/18 0406 06/29/18 0752  GLUCAP  111* 113* 119*    Assessment/Plan: S/P Procedure(s) (LRB): CORONARY ARTERY BYPASS GRAFTING (CABG), ON PUMP, TIMES FOUR, USING LEFT INTERNAL MAMMARY ARTERY AND ENDOSCOPICALLY HARVESTED LEFT GREATER SAPHENOUS VEIN (N/A) TRANSESOPHAGEAL ECHOCARDIOGRAM (TEE) (N/A)  1. Postop day 4 CABG x4.  Making reasonable progress with mobility.  He is maintaining adequate oxygen saturation on room air.   2.  Postoperative atrial fibrillation.  First occurrence on postop day 3.  IV amiodarone initiated with conversion within about 30 minutes.  Had another episode earlier this morning.  We will initiate oral amiodarone load and overlap with the IV amiodarone until after the second dose. Continue low-dose beta-blocker.  K+ acceptable.  3.  Expected acute blood loss anemia.  Hematocrit is 23% and slowly trending down but no indication for transfusion at this time.  We will continue to monitor closely.  4. Chronic renal insufficiency-Baseline creatinine is 2.09, now 2.18.  Continue gentle diuresis.  Blood pressure is currently in the 140s, will not attempt to lower this any further.  5.  Mild thrombocytopenia--no indication for intervention.  We will continue to monitor.  Antony Odea, PA-C [phone] 06/30/2018   I have seen and examined the patient and agree with the assessment and plan as outlined.  Still going in and out of Afib.  May need to consider starting Coumadin if it doesn't resolve, but continue DAPT for now.  Continue amiodarone and increase metoprolol for better HR control.  Mobilize.  Diuresis - weight up further and 5 kg > preop - will give extra dose lasix IV this afternoon.  Rexene Alberts, MD 06/30/2018 11:10 AM

## 2018-07-01 ENCOUNTER — Inpatient Hospital Stay (HOSPITAL_COMMUNITY): Payer: Medicare HMO

## 2018-07-01 LAB — BASIC METABOLIC PANEL
Anion gap: 13 (ref 5–15)
BUN: 30 mg/dL — ABNORMAL HIGH (ref 8–23)
CALCIUM: 8.2 mg/dL — AB (ref 8.9–10.3)
CO2: 23 mmol/L (ref 22–32)
Chloride: 98 mmol/L (ref 98–111)
Creatinine, Ser: 2.21 mg/dL — ABNORMAL HIGH (ref 0.61–1.24)
GFR calc Af Amer: 32 mL/min — ABNORMAL LOW (ref >60)
GFR, EST NON AFRICAN AMERICAN: 27 mL/min — AB (ref >60)
Glucose, Bld: 124 mg/dL — ABNORMAL HIGH (ref 70–99)
Potassium: 4.1 mmol/L (ref 3.5–5.1)
Sodium: 134 mmol/L — ABNORMAL LOW (ref 135–145)

## 2018-07-01 LAB — CBC
HCT: 23.9 % — ABNORMAL LOW (ref 39.0–52.0)
Hemoglobin: 8.2 g/dL — ABNORMAL LOW (ref 13.0–17.0)
MCH: 30.9 pg (ref 26.0–34.0)
MCHC: 34.3 g/dL (ref 30.0–36.0)
MCV: 90.2 fL (ref 80.0–100.0)
Platelets: 204 10*3/uL (ref 150–400)
RBC: 2.65 MIL/uL — ABNORMAL LOW (ref 4.22–5.81)
RDW: 13.7 % (ref 11.5–15.5)
WBC: 8.8 10*3/uL (ref 4.0–10.5)
nRBC: 0.2 % (ref 0.0–0.2)

## 2018-07-01 LAB — PROTIME-INR
INR: 1.14
PROTHROMBIN TIME: 14.5 s (ref 11.4–15.2)

## 2018-07-01 MED ORDER — PATIENT'S GUIDE TO USING COUMADIN BOOK
Freq: Once | Status: AC
  Administered 2018-07-01: 17:00:00
  Filled 2018-07-01 (×2): qty 1

## 2018-07-01 MED ORDER — WARFARIN SODIUM 2.5 MG PO TABS
2.5000 mg | ORAL_TABLET | Freq: Once | ORAL | Status: AC
  Administered 2018-07-01: 2.5 mg via ORAL
  Filled 2018-07-01: qty 1

## 2018-07-01 MED ORDER — WARFARIN - PHYSICIAN DOSING INPATIENT
Freq: Every day | Status: DC
  Administered 2018-07-01 – 2018-07-03 (×2)

## 2018-07-01 NOTE — Progress Notes (Addendum)
4 Days Post-Op Procedure(s) (LRB): CORONARY ARTERY BYPASS GRAFTING (CABG), ON PUMP, TIMES FOUR, USING LEFT INTERNAL MAMMARY ARTERY AND ENDOSCOPICALLY HARVESTED LEFT GREATER SAPHENOUS VEIN (N/A) TRANSESOPHAGEAL ECHOCARDIOGRAM (TEE) (N/A) Subjective: Resting quietly in bedside chair this morning with no new problems or concerns.  Making slow progress with mobility.  Objective: Vital signs in last 24 hours: Temp:  [98.3 F (36.8 C)-98.7 F (37.1 C)] 98.7 F (37.1 C) (02/02 0337) Pulse Rate:  [63-77] 71 (02/02 0850) Cardiac Rhythm: Normal sinus rhythm (02/02 0700) Resp:  [18-22] 22 (02/02 0422) BP: (123-136)/(51-65) 136/63 (02/02 0850) SpO2:  [92 %-93 %] 93 % (02/02 0337) Weight:  [81.2 kg] 81.2 kg (02/02 0422)     Intake/Output from previous day: 02/01 0701 - 02/02 0700 In: 420 [P.O.:420] Out: -  Intake/Output this shift: Total I/O In: 3 [I.V.:3] Out: -   It is documented that he voided 3 times yesterday but urine output was not documented.  General appearance: alert, cooperative and no distress Heart: Monitor shows periods of sinus rhythm alternating with atrial fibrillation with V rate around 100-1 10 Lungs: clear to auscultation bilaterally Abdomen: Soft and nontender Extremities: Mild lower extremity edema.  EVH incisions intact and dry Wound: Sternal incision is well approximated and the sternum is stable.  Lab Results: Recent Labs    06/30/18 0412 07/01/18 0227  WBC 7.8 8.8  HGB 7.7* 8.2*  HCT 23.6* 23.9*  PLT 131* 204   BMET:  Recent Labs    06/30/18 0412 07/01/18 0227  NA 133* 134*  K 4.3 4.1  CL 102 98  CO2 23 23  GLUCOSE 109* 124*  BUN 28* 30*  CREATININE 2.18* 2.21*  CALCIUM 8.1* 8.2*    PT/INR: No results for input(s): LABPROT, INR in the last 72 hours. ABG    Component Value Date/Time   PHART 7.319 (L) 06/27/2018 1949   HCO3 20.7 06/27/2018 1949   TCO2 22 06/27/2018 1949   ACIDBASEDEF 5.0 (H) 06/27/2018 1949   O2SAT 95.0 06/27/2018 1949    CBG (last 3)  Recent Labs    06/29/18 0010 06/29/18 0406 06/29/18 0752  GLUCAP 111* 113* 119*    Assessment/Plan: S/P Procedure(s) (LRB): CORONARY ARTERY BYPASS GRAFTING (CABG), ON PUMP, TIMES FOUR, USING LEFT INTERNAL MAMMARY ARTERY AND ENDOSCOPICALLY HARVESTED LEFT GREATER SAPHENOUS VEIN (N/A) TRANSESOPHAGEAL ECHOCARDIOGRAM (TEE) (N/A)  1. Postop day 4 CABG x4.  Making slow but reasonable progress with mobility.  He is maintaining adequate oxygen saturation on room air.   2.  Postoperative atrial fibrillation.  First occurrence on postop day 3.  IV amiodarone initiated with conversion within about 30 minutes.  He had recurrence of the atrial fibrillation early yesterday morning and continues to have PAF.  Evaluation and recommendations by Dr. Sallyanne Kuster is appreciated.  Oral amiodarone has been initiated.  He seems to be tolerating the increased dose of metoprolol.  Agree with initiating anticoagulation with Coumadin.  Will discontinue Plavix.  3.  Expected acute blood loss anemia.  Hematocrit is around 23% and now trending up. No indication for transfusion at this time.  We will continue to monitor closely.  4. Chronic renal insufficiency-Baseline creatinine is 2.09, now 2.21.  Continue gentle diuresis.   5.  Mild thrombocytopenia--resolved  6.  Moderate left pleural effusion-follow-up chest x-ray in a.m.  Antony Odea, PA-C [phone]  LOS: 10 days  07/01/2018  I have seen and examined the patient and agree with the assessment and plan as outlined.  Still having PAF.  Stop  Plavix and start Coumadin.  Continue amiodarone and increase metoprolol for better rate control when he is in Afib.  Watch renal function closely.   Rexene Alberts, MD 07/01/2018 10:57 AM

## 2018-07-01 NOTE — Progress Notes (Signed)
Progress Note  Patient Name: Kevin [name] Date of Encounter: 07/01/2018  Primary Cardiologist: Dorris Carnes, MD   Subjective   No cardiovascular complaints.  Telemetry was very quiet overnight but starting around 10 AM shows very frequent and brief episodes of paroxysmal atrial fibrillation with mild rapid ventricular response.  He remains completely unaware of the arrhythmia. Onset of this flurry of arrhythmia seems to coincide with getting out of bed this morning.  Inpatient Medications    Scheduled Meds: . acetaminophen  1,000 mg Oral Q6H  . amiodarone  400 mg Oral BID  . aspirin EC  81 mg Oral Daily  . atorvastatin  20 mg Oral Daily  . bisacodyl  10 mg Oral Daily  . cholecalciferol  1,000 Units Oral Daily  . docusate sodium  200 mg Oral Daily  . fenofibrate  160 mg Oral Daily  . furosemide  20 mg Oral Daily  . gabapentin  300 mg Oral BID  . Melatonin  9 mg Oral QHS  . metoprolol tartrate  25 mg Oral BID  . pantoprazole  40 mg Oral Daily  . patient's guide to using coumadin book   Does not apply Once  . sodium chloride flush  3 mL Intravenous Q12H  . tamsulosin  0.4 mg Oral QHS  . triamcinolone cream  1 application Topical BID  . vitamin B-12  1,000 mcg Oral Daily  . warfarin  2.5 mg Oral ONCE-1800  . Warfarin - Physician Dosing Inpatient   Does not apply q1800   Continuous Infusions: . sodium chloride     PRN Meds: sodium chloride, albuterol, alum & mag hydroxide-simeth, magnesium hydroxide, metoprolol tartrate, ondansetron (ZOFRAN) IV, oxyCODONE, sodium chloride flush, traMADol   Vital Signs    Vitals:   06/30/18 1957 07/01/18 0337 07/01/18 0422 07/01/18 0850  BP: 135/63 123/65  136/63  Pulse: 77 63  71  Resp: (!) 22 18 (!) 22   Temp: 98.3 F (36.8 C) 98.7 F (37.1 C)    TempSrc: Oral Oral    SpO2: 93% 93%    Weight:   81.2 kg   Height:        Intake/Output Summary (Last 24 hours) at 07/01/2018 1203 Last data filed at 07/01/2018 0900 Gross per 24 hour    Intake 303 ml  Output -  Net 303 ml   Last 3 Weights 07/01/2018 06/30/2018 06/29/2018  Weight (lbs) 179 lb 1.6 oz 184 lb 173 lb 8 oz  Weight (kg) 81.239 kg 83.462 kg 78.7 kg      Telemetry    Frequent brief episodes of paroxysmal atrial fibrillation with rapid ventricular response starting around 10 AM; no significant bradycardia recorded overnight- Personally Reviewed  ECG    No new tracing- Personally Reviewed  Physical Exam  Looks comfortable, sternotomy scar healing well. GEN: No acute distress.   Neck: No JVD Cardiac: RRR, no murmurs, rubs, or gallops.  Respiratory: Clear to auscultation bilaterally. GI: Soft, nontender, non-distended  MS: No edema; No deformity. Neuro:  Nonfocal  Psych: Normal affect   Labs    Chemistry Recent Labs  Lab 06/26/18 0326  06/29/18 0320 06/30/18 0412 07/01/18 0227  NA 136   < > 138 133* 134*  K 4.0   < > 4.6 4.3 4.1  CL 102   < > 109 102 98  CO2 25   < > 23 23 23   GLUCOSE 132*   < > 108* 109* 124*  BUN 33*   < >  21 28* 30*  CREATININE 2.56*   < > 1.89* 2.18* 2.21*  CALCIUM 8.6*   < > 7.9* 8.1* 8.2*  PROT 5.6*  --   --   --   --   ALBUMIN 3.3*  --   --   --   --   AST 23  --   --   --   --   ALT 16  --   --   --   --   ALKPHOS 26*  --   --   --   --   BILITOT 0.5  --   --   --   --   GFRNONAA 23*   < > 33* 28* 27*  GFRAA 27*   < > 38* 32* 32*  ANIONGAP 9   < > 6 8 13    < > = values in this interval not displayed.     Hematology Recent Labs  Lab 06/29/18 0320 06/30/18 0412 07/01/18 0227  WBC 8.6 7.8 8.8  RBC 2.70* 2.59* 2.65*  HGB 8.1* 7.7* 8.2*  HCT 25.3* 23.6* 23.9*  MCV 93.7 91.1 90.2  MCH 30.0 29.7 30.9  MCHC 32.0 32.6 34.3  RDW 13.9 13.6 13.7  PLT 117* 131* 204    Cardiac EnzymesNo results for input(s): TROPONINI in the last 168 hours. No results for input(s): TROPIPOC in the last 168 hours.   BNPNo results for input(s): BNP, PROBNP in the last 168 hours.   DDimer No results for input(s): DDIMER in the  last 168 hours.   Radiology    Dg Chest 2 View  Result Date: 06/30/2018 CLINICAL DATA:  Patient status post CABG 06/27/2018. EXAM: CHEST - 2 VIEW COMPARISON:  Single-view of the chest 06/29/2018. FINDINGS: Six intact median sternotomy wires are unchanged. Right IJ sheath has been removed. No pneumothorax. Moderate to moderately large left pleural effusion and basilar atelectasis have slightly worsened since the comparison examination. Small right effusion noted. No pulmonary edema. No acute or focal bony abnormality. IMPRESSION: Mild increase in a moderate to moderately large left pleural effusion and associated basilar atelectasis. Trace right pleural effusion. Electronically Signed   By: Inge Rise M.D.   On: 06/30/2018 09:30    Cardiac Studies   June 20, 2018 echocardiogram - Left ventricle: The cavity size was normal. There was mild concentric hypertrophy. Systolic function was normal. The estimated ejection fraction was in the range of 60% to 65%. Wall motion was normal; there were no regional wall motion abnormalities. Doppler parameters are consistent with abnormal left ventricular relaxation (grade 1 diastolic dysfunction). Doppler parameters are consistent with high ventricular filling pressure. - Aortic valve: Transvalvular velocity was within the normal range. There was no stenosis. There was no regurgitation. Valve area (VTI): 2.17 cm^2. Valve area (Vmean): 2.45 cm^2. - Mitral valve: Mildly calcified annulus. Transvalvular velocity was within the normal range. There was no evidence for stenosis. There was no regurgitation. - Left atrium: The atrium was mildly dilated. - Right ventricle: The cavity size was normal. Wall thickness was normal. Systolic function was normal. - Tricuspid valve: There was trivial regurgitation. - Pulmonary arteries: Systolic pressure was within the normal range. PA peak pressure: 24 mm Hg (S).   Patient  Profile     78 y.o. male day #4 status post CABG x4, frequent asymptomatic episodes of atrial fibrillation rapid ventricular response postop.  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   Assessment & Plan      1. Postop AFib:  Frequent but brief episodes of atrial fibrillation rapid ventricular response, asymptomatic.  Continue beta-blocker and oral amiodarone.  Will require warfarin anticoagulation.  Discussed warfarin anticoagulation monitoring, food interactions, drug interactions.  Suspect the burden of arrhythmia will gradually decline over the next 30 days and we might be able to discontinue amiodarone and eventually also discontinue anticoagulation over the next 3 months 2. CAD s/p CABG:  On aspirin and statin.  Clopidogrel to stop when he starts warfarin. 3.  History of sinus bradycardia:  So far no problems with bradycardia on higher dose of beta-blocker and oral amiodarone.  May need to reduce the dose of beta-blocker as the amiodarone gradually "kicks in". 4. CKD 3-4: Minimum change in creatinine.     For questions or updates, please contact [place] Please consult www.Amion.com for contact info under        Signed, Sanda Klein, MD  07/01/2018, 12:03 PM

## 2018-07-01 NOTE — Discharge Instructions (Addendum)
Discharge Instructions:  1. You may shower, please wash incisions daily with soap and water and keep dry.  If you wish to cover wounds with dressing you may do so but please keep clean and change daily.  No tub baths or swimming until incisions have completely healed.  If your incisions become red or develop any drainage please call our office at [phone]  2. No Driving until cleared by Dr. Gomez Cleverly and you are no longer using narcotic pain medications  3. Monitor your weight daily.. Please use the same scale and weigh at same time... If you gain 5-10 lbs in 48 hours with associated lower extremity swelling, please contact our office at [phone]  4. Fever of 101.5 for at least 24 hours with no source, please contact our office at [phone]  5. Activity- up as tolerated, please walk at least 3 times per day.  Avoid strenuous activity, no lifting, pushing, or pulling with your arms over 8-10 lbs for a minimum of 6 weeks  6. If any questions or concerns arise, please do not hesitate to contact our office at [phone]   Information on my medicine - Coumadin   (Warfarin)  Why was Coumadin prescribed for you? Coumadin was prescribed for you because you have a blood clot or a medical condition that can cause an increased risk of forming blood clots. Blood clots can cause serious health problems by blocking the flow of blood to the heart, lung, or brain. Coumadin can prevent harmful blood clots from forming. As a reminder your indication for Coumadin is:   Stroke Prevention Because Of Atrial Fibrillation  What test will check on my response to Coumadin? While on Coumadin (warfarin) you will need to have an INR test regularly to ensure that your dose is keeping you in the desired range. The INR (international normalized ratio) number is calculated from the result of the laboratory test called prothrombin time (PT).  If an INR APPOINTMENT HAS NOT ALREADY BEEN MADE FOR YOU  please schedule an appointment to have this lab work done by your health care provider within 7 days. Your INR goal is usually a number between:  2 to 3 or your provider may give you a more narrow range like 2-2.5.  Ask your health care provider during an office visit what your goal INR is.  What  do you need to  know  About  COUMADIN? Take Coumadin (warfarin) exactly as prescribed by your healthcare provider about the same time each day.  DO NOT stop taking without talking to the doctor who prescribed the medication.  Stopping without other blood clot prevention medication to take the place of Coumadin may increase your risk of developing a new clot or stroke.  Get refills before you run out.  What do you do if you miss a dose? If you miss a dose, take it as soon as you remember on the same day then continue your regularly scheduled regimen the next day.  Do not take two doses of Coumadin at the same time.  Important Safety Information A possible side effect of Coumadin (Warfarin) is an increased risk of bleeding. You should call your healthcare provider right away if you experience any of the following: ? Bleeding from an injury or your nose that does not stop. ? Unusual colored urine (red or dark brown) or unusual colored stools (red or black). ? Unusual bruising for unknown reasons. ? A serious fall or if you hit your head (even if there  is no bleeding).  Some foods or medicines interact with Coumadin (warfarin) and might alter your response to warfarin. To help avoid this: ? Eat a balanced diet, maintaining a consistent amount of Vitamin K. ? Notify your provider about major diet changes you plan to make. ? Avoid alcohol or limit your intake to 1 drink for women and 2 drinks for men per day. (1 drink is 5 oz. wine, 12 oz. beer, or 1.5 oz. liquor.)  Make sure that ANY health care provider who prescribes medication for you knows that you are taking Coumadin (warfarin).  Also make sure the  healthcare provider who is monitoring your Coumadin knows when you have started a new medication including herbals and non-prescription products.  Coumadin (Warfarin)  Major Drug Interactions  Increased Warfarin Effect Decreased Warfarin Effect  Alcohol (large quantities) Antibiotics (esp. Septra/Bactrim, Flagyl, Cipro) Amiodarone (Cordarone) Aspirin (ASA) Cimetidine (Tagamet) Megestrol (Megace) NSAIDs (ibuprofen, naproxen, etc.) Piroxicam (Feldene) Propafenone (Rythmol SR) Propranolol (Inderal) Isoniazid (INH) Posaconazole (Noxafil) Barbiturates (Phenobarbital) Carbamazepine (Tegretol) Chlordiazepoxide (Librium) Cholestyramine (Questran) Griseofulvin Oral Contraceptives Rifampin Sucralfate (Carafate) Vitamin K   Coumadin (Warfarin) Major Herbal Interactions  Increased Warfarin Effect Decreased Warfarin Effect  Garlic Ginseng Ginkgo biloba Coenzyme Q10 Green tea St. Johns wort    Coumadin (Warfarin) FOOD Interactions  Eat a consistent number of servings per week of foods HIGH in Vitamin K (1 serving =  cup)  Collards (cooked, or boiled & drained) Kale (cooked, or boiled & drained) Mustard greens (cooked, or boiled & drained) Parsley *serving size only =  cup Spinach (cooked, or boiled & drained) Swiss chard (cooked, or boiled & drained) Turnip greens (cooked, or boiled & drained)  Eat a consistent number of servings per week of foods MEDIUM-HIGH in Vitamin K (1 serving = 1 cup)  Asparagus (cooked, or boiled & drained) Broccoli (cooked, boiled & drained, or raw & chopped) Brussel sprouts (cooked, or boiled & drained) *serving size only =  cup Lettuce, raw (green leaf, endive, romaine) Spinach, raw Turnip greens, raw & chopped   These websites have more information on Coumadin (warfarin):  FailFactory.se; VeganReport.com.au;

## 2018-07-02 ENCOUNTER — Inpatient Hospital Stay (HOSPITAL_COMMUNITY): Payer: Medicare HMO

## 2018-07-02 LAB — CBC
HCT: 27.1 % — ABNORMAL LOW (ref 39.0–52.0)
Hemoglobin: 9.1 g/dL — ABNORMAL LOW (ref 13.0–17.0)
MCH: 30.6 pg (ref 26.0–34.0)
MCHC: 33.6 g/dL (ref 30.0–36.0)
MCV: 91.2 fL (ref 80.0–100.0)
Platelets: 325 10*3/uL (ref 150–400)
RBC: 2.97 MIL/uL — ABNORMAL LOW (ref 4.22–5.81)
RDW: 13.7 % (ref 11.5–15.5)
WBC: 10.8 10*3/uL — ABNORMAL HIGH (ref 4.0–10.5)
nRBC: 0.3 % — ABNORMAL HIGH (ref 0.0–0.2)

## 2018-07-02 LAB — BASIC METABOLIC PANEL
Anion gap: 14 (ref 5–15)
BUN: 31 mg/dL — ABNORMAL HIGH (ref 8–23)
CO2: 24 mmol/L (ref 22–32)
CREATININE: 2.25 mg/dL — AB (ref 0.61–1.24)
Calcium: 8.5 mg/dL — ABNORMAL LOW (ref 8.9–10.3)
Chloride: 99 mmol/L (ref 98–111)
GFR calc Af Amer: 31 mL/min — ABNORMAL LOW (ref >60)
GFR calc non Af Amer: 27 mL/min — ABNORMAL LOW (ref >60)
GLUCOSE: 131 mg/dL — AB (ref 70–99)
Potassium: 3.6 mmol/L (ref 3.5–5.1)
Sodium: 137 mmol/L (ref 135–145)

## 2018-07-02 LAB — PROTIME-INR
INR: 1.22
Prothrombin Time: 15.3 seconds — ABNORMAL HIGH (ref 11.4–15.2)

## 2018-07-02 LAB — MAGNESIUM: Magnesium: 2.1 mg/dL (ref 1.7–2.4)

## 2018-07-02 MED ORDER — METOPROLOL TARTRATE 25 MG PO TABS
37.5000 mg | ORAL_TABLET | Freq: Two times a day (BID) | ORAL | Status: DC
  Administered 2018-07-02 – 2018-07-03 (×4): 37.5 mg via ORAL
  Filled 2018-07-02 (×4): qty 1

## 2018-07-02 MED ORDER — WARFARIN SODIUM 2.5 MG PO TABS
2.5000 mg | ORAL_TABLET | Freq: Every day | ORAL | Status: AC
  Administered 2018-07-02: 2.5 mg via ORAL
  Filled 2018-07-02: qty 1

## 2018-07-02 NOTE — Progress Notes (Signed)
07/02/2018 8:48 AM EPW D/C'd per order and per protocol.  Ends intact. Pt. Tolerated well.  Advised bedrest x1hr.  Call bell in reach.  Vital signs collected per protocol. [place]

## 2018-07-02 NOTE — Care Management Important Message (Signed)
Important Message  Patient Details  Name: Kevin [name] MRN: [id number] Date of Birth: 19-Nov-1940   Medicare Important Message Given:  Yes    Laverne Hursey P Presho 07/02/2018, 4:11 PM

## 2018-07-02 NOTE — Progress Notes (Addendum)
[address]       [place] [address]             [phone]      5 Days Post-Op Procedure(s) (LRB): CORONARY ARTERY BYPASS GRAFTING (CABG), ON PUMP, TIMES FOUR, USING LEFT INTERNAL MAMMARY ARTERY AND ENDOSCOPICALLY HARVESTED LEFT GREATER SAPHENOUS VEIN (N/A) TRANSESOPHAGEAL ECHOCARDIOGRAM (TEE) (N/A)   Subjective:  No new complaints.  States his mobility is a little bit better this morning. Bilateral tremors present, patient states he has had this prior to surgery.    Objective: Vital signs in last 24 hours: Temp:  [98.2 F (36.8 C)-98.8 F (37.1 C)] 98.8 F (37.1 C) (02/03 0548) Pulse Rate:  [70-81] 70 (02/03 0548) Cardiac Rhythm: Normal sinus rhythm (02/03 0700) Resp:  [20-22] 20 (02/03 0548) BP: (136-165)/(61-73) 165/73 (02/03 0548) SpO2:  [96 %-98 %] 98 % (02/03 0548) Weight:  [80.3 kg] 80.3 kg (02/03 0548)  Intake/Output from previous day: 02/02 0701 - 02/03 0700 In: 403 [P.O.:400; I.V.:3] Out: -   General appearance: alert, cooperative and no distress Heart: regular rate and rhythm Lungs: clear to auscultation bilaterally Abdomen: soft, non-tender; bowel sounds normal; no masses,  no organomegaly Extremities: edema minimal Wound: clean and dry  Lab Results: Recent Labs    07/01/18 0227 07/02/18 0249  WBC 8.8 10.8*  HGB 8.2* 9.1*  HCT 23.9* 27.1*  PLT 204 325   BMET:  Recent Labs    07/01/18 0227 07/02/18 0249  NA 134* 137  K 4.1 3.6  CL 98 99  CO2 23 24  GLUCOSE 124* 131*  BUN 30* 31*  CREATININE 2.21* 2.25*  CALCIUM 8.2* 8.5*    PT/INR:  Recent Labs    07/02/18 0249  LABPROT 15.3*  INR 1.22   ABG    Component Value Date/Time   PHART 7.319 (L) 06/27/2018 1949   HCO3 20.7 06/27/2018 1949   TCO2 22 06/27/2018 1949   ACIDBASEDEF 5.0 (H) 06/27/2018 1949   O2SAT 95.0 06/27/2018 1949   CBG (last 3)  No results for input(s): GLUCAP in the last 72 hours.  Assessment/Plan: S/P Procedure(s) (LRB): CORONARY  ARTERY BYPASS GRAFTING (CABG), ON PUMP, TIMES FOUR, USING LEFT INTERNAL MAMMARY ARTERY AND ENDOSCOPICALLY HARVESTED LEFT GREATER SAPHENOUS VEIN (N/A) TRANSESOPHAGEAL ECHOCARDIOGRAM (TEE) (N/A)  1. CV- continue episodic PAF, currently NSR with PVCs- will increase Lopressor to 37.5 mg BID and continue Amiodarone 400 mg BID 2. INR at 1.21, will repeat dose of 2.5 mg daily, if no response can increase dose tomorrow AM 3. Renal- stage III CKD, creatinine stable at 2.25, weight is trending down, minimal to no edema on exam, on low dose Lasix at 20 mg daily 4. Expected post operative blood loss anemia, mild Hgb at 9.1 5. Left Pleural Effusion- may benefit from thoracentesis will discuss with Dr. Roxan Hockey 6. Dispo- patient stable, continues to have brief episodes of Atrial Fibrillation, currently in NSR with PVCs- titrate Lopressor to 37.5 mg BID, continue Amiodarone, coumadin at 2.5 mg tonight, if no response can increase dose tomorrow, will d/c EPW, possible Thoracentesis will discuss with Dr. Roxan Hockey   LOS: 11 days    Ellwood Handler 07/02/2018 Patient seen and examined, agree with above Rhythm as noted above Renal function is stable and only slightly above baseline creatinine of 2.1 Will check a PA and lateral CXR to get a better look at left effusion +/- atelectasis  Revonda Standard. Roxan Hockey, MD [facility] [phone]

## 2018-07-02 NOTE — Care Management Note (Signed)
Case Management Note Original Note Created Bethena Roys, RN 06/25/2018, 12:43 PM   Patient Details  Name: Kevin [name] MRN: [id number] Date of Birth: August 30, 1940  Subjective/Objective:  Pt presented for Chest Pain- Plavix washout and plan for CABG 06-27-18. PTA independent from home with wife. Pt uses a recliner in the home 2/2 shoulder issues. Wife had concerns that he will probably do better with a hospital bed in the home.                    Action/Plan: CM will continue to monitor for additional transition of care needs and DME.   Expected Discharge Date:                  Expected Discharge Plan:  [place]  In-House Referral:  NA  Discharge planning Services  CM Consult  Post Acute Care Choice:  Durable Medical Equipment, Home Health Choice offered to:  Patient, Spouse  DME Arranged:    DME Agency:  NA  HH Arranged:  RN, PT [place] Agency:     Status of Service:  In process, will continue to follow  If discussed at Long Length of Stay Meetings, dates discussed:    Discharge Disposition:   Additional Comments:  07/02/18- 1430- Marvetta Gibbons RN, CM- Pt s/p CABGx3 on 06/28/18, plan to return home with wife, orders have been placed for HHRN/PT, CM spoke with pt and wife at bedside, list for [facility] provided to pt Per CMS guidelines from medicare.gov website with star ratings (copy placed in shadow chart)- wife would like to look over list and let CM know of choice in am, per pt he has needed DME at home, and no other needs noted- CM will f/u in am for [facility] referral needs  Darric, Plante, RN 07/02/2018, 2:37 PM [phone] 4E Transitions of Care RN Case Manager

## 2018-07-02 NOTE — Progress Notes (Signed)
Progress Note  Patient Name: Kevin [name] Date of Encounter: 07/02/2018  Primary Cardiologist: Dorris Carnes, MD   Subjective   Continues to have intermittent atrial fibrillation. When I saw him epicardial pacing wires had just been removed and patient was having asymptomatic afib RVR.   Inpatient Medications    Scheduled Meds: . acetaminophen  1,000 mg Oral Q6H  . amiodarone  400 mg Oral BID  . aspirin EC  81 mg Oral Daily  . atorvastatin  20 mg Oral Daily  . bisacodyl  10 mg Oral Daily  . cholecalciferol  1,000 Units Oral Daily  . docusate sodium  200 mg Oral Daily  . fenofibrate  160 mg Oral Daily  . furosemide  20 mg Oral Daily  . gabapentin  300 mg Oral BID  . Melatonin  9 mg Oral QHS  . metoprolol tartrate  37.5 mg Oral BID  . pantoprazole  40 mg Oral Daily  . sodium chloride flush  3 mL Intravenous Q12H  . tamsulosin  0.4 mg Oral QHS  . triamcinolone cream  1 application Topical BID  . vitamin B-12  1,000 mcg Oral Daily  . warfarin  2.5 mg Oral q1800  . Warfarin - Physician Dosing Inpatient   Does not apply q1800   Continuous Infusions: . sodium chloride     PRN Meds: sodium chloride, albuterol, alum & mag hydroxide-simeth, magnesium hydroxide, metoprolol tartrate, ondansetron (ZOFRAN) IV, oxyCODONE, sodium chloride flush, traMADol   Vital Signs    Vitals:   07/01/18 1951 07/02/18 0512 07/02/18 0548 07/02/18 0832  BP: 136/61  (!) 165/73 (!) 146/70  Pulse: 81  70 (!) 102  Resp: (!) 22 20 20  (!) 21  Temp: 98.2 F (36.8 C)  98.8 F (37.1 C) 97.7 F (36.5 C)  TempSrc: Oral  Oral Oral  SpO2: 96%  98% 94%  Weight:   80.3 kg   Height:        Intake/Output Summary (Last 24 hours) at 07/02/2018 1010 Last data filed at 07/02/2018 0842 Gross per 24 hour  Intake 520 ml  Output -  Net 520 ml   Last 3 Weights 07/02/2018 07/01/2018 06/30/2018  Weight (lbs) 177 lb 0.5 oz 179 lb 1.6 oz 184 lb  Weight (kg) 80.3 kg 81.239 kg 83.462 kg      Telemetry    PAF- Personally  Reviewed  ECG    No new tracing- Personally Reviewed  Physical Exam  Constitutional: No acute distress Cardiovascular: regular rhythm, normal rate, no murmurs. S1 and S2 normal. Chest: well healing sternotomy scar. Respiratory: clear to auscultation bilaterally GI : normal bowel sounds, soft and nontender. No distention.   MSK: extremities warm, well perfused. 1+ bilateral edema.  NEURO: grossly nonfocal exam, moves all extremities. PSYCH: alert and oriented x 3, normal mood and affect.   Labs    Chemistry Recent Labs  Lab 06/26/18 0326  06/30/18 0412 07/01/18 0227 07/02/18 0249  NA 136   < > 133* 134* 137  K 4.0   < > 4.3 4.1 3.6  CL 102   < > 102 98 99  CO2 25   < > 23 23 24   GLUCOSE 132*   < > 109* 124* 131*  BUN 33*   < > 28* 30* 31*  CREATININE 2.56*   < > 2.18* 2.21* 2.25*  CALCIUM 8.6*   < > 8.1* 8.2* 8.5*  PROT 5.6*  --   --   --   --  ALBUMIN 3.3*  --   --   --   --   AST 23  --   --   --   --   ALT 16  --   --   --   --   ALKPHOS 26*  --   --   --   --   BILITOT 0.5  --   --   --   --   GFRNONAA 23*   < > 28* 27* 27*  GFRAA 27*   < > 32* 32* 31*  ANIONGAP 9   < > 8 13 14    < > = values in this interval not displayed.     Hematology Recent Labs  Lab 06/30/18 0412 07/01/18 0227 07/02/18 0249  WBC 7.8 8.8 10.8*  RBC 2.59* 2.65* 2.97*  HGB 7.7* 8.2* 9.1*  HCT 23.6* 23.9* 27.1*  MCV 91.1 90.2 91.2  MCH 29.7 30.9 30.6  MCHC 32.6 34.3 33.6  RDW 13.6 13.7 13.7  PLT 131* 204 325    Cardiac EnzymesNo results for input(s): TROPONINI in the last 168 hours. No results for input(s): TROPIPOC in the last 168 hours.   BNPNo results for input(s): BNP, PROBNP in the last 168 hours.   DDimer No results for input(s): DDIMER in the last 168 hours.   Radiology    Dg Chest Port 1 View  Result Date: 07/01/2018 CLINICAL DATA:  S/P CABG. Hx of PNA, HTN AND peripheral edema. Pt is a former smoker. Pt c/o SOB. EXAM: PORTABLE CHEST 1 VIEW COMPARISON:  06/30/2018  and older exams. FINDINGS: There is persistent opacity at the left lung base obscuring hemidiaphragm left heart border consistent with a moderate pleural effusion with associated basilar atelectasis or possibly pneumonia. Remainder of the left lung and the right lung are clear. Stable changes from prior CABG surgery. IMPRESSION: 1. No change from the previous day's study. 2. Moderate left pleural effusion with associated basilar atelectasis or possibly pneumonia. No pulmonary edema Electronically Signed   By: Lajean Manes M.D.   On: 07/01/2018 13:38    Cardiac Studies   June 20, 2018 echocardiogram - Left ventricle: The cavity size was normal. There was mild concentric hypertrophy. Systolic function was normal. The estimated ejection fraction was in the range of 60% to 65%. Wall motion was normal; there were no regional wall motion abnormalities. Doppler parameters are consistent with abnormal left ventricular relaxation (grade 1 diastolic dysfunction). Doppler parameters are consistent with high ventricular filling pressure. - Aortic valve: Transvalvular velocity was within the normal range. There was no stenosis. There was no regurgitation. Valve area (VTI): 2.17 cm^2. Valve area (Vmean): 2.45 cm^2. - Mitral valve: Mildly calcified annulus. Transvalvular velocity was within the normal range. There was no evidence for stenosis. There was no regurgitation. - Left atrium: The atrium was mildly dilated. - Right ventricle: The cavity size was normal. Wall thickness was normal. Systolic function was normal. - Tricuspid valve: There was trivial regurgitation. - Pulmonary arteries: Systolic pressure was within the normal range. PA peak pressure: 24 mm Hg (S).   Patient Profile     78 y.o. male day #4 status post CABG x4, frequent asymptomatic episodes of atrial fibrillation rapid ventricular response postop.  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  Assessment & Plan      1. Postop AFib:  Frequent but brief episodes of atrial fibrillation rapid ventricular response,  asymptomatic.  Metoprolol tartrate at increased dose and oral amiodarone can continue. Continue warfarin anticoagulation. Can discuss duration of anticoagulation at follow up depending on afib burden.  2. CAD s/p CABG:  On aspirin and statin.   3.  History of sinus bradycardia:  So far no problems with bradycardia on higher dose of beta-blocker and oral amiodarone.  4. CKD 3-4: Minimum change in creatinine, continue to monitor.  Cardiology will follow along.     For questions or updates, please contact [place] Please consult [email or web address] for contact info under      Signed, Elouise Munroe, MD  07/02/2018, 10:10 AM

## 2018-07-02 NOTE — Progress Notes (Signed)
CARDIAC REHAB PHASE I   PRE:  Rate/Rhythm: 60 SR  BP:  Supine:   Sitting: 129/56  Standing:    SaO2: 94%RA  MODE:  Ambulation: 290 ft   POST:  Rate/Rhythm: 86 SR PVCs  BP:  Supine:   Sitting: 135/64  Standing:    SaO2: 96%RA 1340-1410 Pt walked 290 ft on RA with gait belt use, rolling walker and asst x 2. Pt tolerated well. He remained in NSR. To bed after walk.    Graylon Good, RN BSN  07/02/2018 2:04 PM

## 2018-07-03 ENCOUNTER — Ambulatory Visit: Payer: Medicare HMO | Admitting: Family Medicine

## 2018-07-03 LAB — BASIC METABOLIC PANEL
ANION GAP: 8 (ref 5–15)
BUN: 30 mg/dL — ABNORMAL HIGH (ref 8–23)
CO2: 27 mmol/L (ref 22–32)
Calcium: 8.3 mg/dL — ABNORMAL LOW (ref 8.9–10.3)
Chloride: 103 mmol/L (ref 98–111)
Creatinine, Ser: 2.17 mg/dL — ABNORMAL HIGH (ref 0.61–1.24)
GFR calc Af Amer: 32 mL/min — ABNORMAL LOW (ref >60)
GFR calc non Af Amer: 28 mL/min — ABNORMAL LOW (ref >60)
Glucose, Bld: 119 mg/dL — ABNORMAL HIGH (ref 70–99)
POTASSIUM: 3.6 mmol/L (ref 3.5–5.1)
Sodium: 138 mmol/L (ref 135–145)

## 2018-07-03 LAB — PROTIME-INR
INR: 1.27
Prothrombin Time: 15.8 seconds — ABNORMAL HIGH (ref 11.4–15.2)

## 2018-07-03 MED ORDER — LACTULOSE 10 GM/15ML PO SOLN: 20.0000 g | Freq: Every day | ORAL | Status: DC | PRN

## 2018-07-03 MED ORDER — WARFARIN SODIUM 5 MG PO TABS
5.0000 mg | ORAL_TABLET | Freq: Once | ORAL | Status: AC
  Administered 2018-07-03: 5 mg via ORAL
  Filled 2018-07-03: qty 1

## 2018-07-03 NOTE — Progress Notes (Signed)
CARDIAC REHAB PHASE I   PRE:  Rate/Rhythm: 56 SR  BP:  Sitting: 107/64      SaO2: 97 RA  MODE:  Ambulation: 350 ft   POST:  Rate/Rhythm: 83 SR  BP:  Sitting: 115/66    SaO2: 96 RA   Pt ambulated 395ft in hallway standby assist with front wheel walker. Pt denies CP, c/o slight SOB. Pt returned to bed, wife at bedside. Will continue to follow.  [id number] Rufina Falco, RN BSN 07/03/2018 10:55 AM

## 2018-07-03 NOTE — Telephone Encounter (Signed)
Called and spoke to Kevin [name]. She states since Kevin [name] has been in the hospital sleeping in the hospital bed his pain level in his back and hips has decreased significantly as well as the swelling in his legs. She is requesting a hospital bed until they can get an adjustable bed in the home. Kevin [name] spoke with the insurance company and they stated they need an order. Faxing an order to Kevin [name]

## 2018-07-03 NOTE — Progress Notes (Addendum)
      [address]       [place] [address]             [phone]      6 Days Post-Op Procedure(s) (LRB): CORONARY ARTERY BYPASS GRAFTING (CABG), ON PUMP, TIMES FOUR, USING LEFT INTERNAL MAMMARY ARTERY AND ENDOSCOPICALLY HARVESTED LEFT GREATER SAPHENOUS VEIN (N/A) TRANSESOPHAGEAL ECHOCARDIOGRAM (TEE) (N/A)   Subjective:  Patient states he would do better if he was at home.  + ambulation  + BM, small   Objective: Vital signs in last 24 hours: Temp:  [97.7 F (36.5 C)-98.6 F (37 C)] 98.6 F (37 C) (02/04 0525) Pulse Rate:  [69-102] 69 (02/03 2215) Cardiac Rhythm: Normal sinus rhythm;Sinus tachycardia (02/03 2035) Resp:  [18-21] 18 (02/04 0525) BP: (130-163)/(61-84) 163/84 (02/04 0525) SpO2:  [94 %-96 %] 96 % (02/04 0525) Weight:  [80 kg] 80 kg (02/04 0525)  Intake/Output from previous day: 02/03 0701 - 02/04 0700 In: 240 [P.O.:240] Out: -   General appearance: alert, cooperative and no distress Heart: regular rate and rhythm Lungs: left base Abdomen: soft, non-tender; bowel sounds normal; no masses,  no organomegaly Extremities: edema minimal Wound: clean and dry  Lab Results: Recent Labs    07/01/18 0227 07/02/18 0249  WBC 8.8 10.8*  HGB 8.2* 9.1*  HCT 23.9* 27.1*  PLT 204 325   BMET:  Recent Labs    07/02/18 0249 07/03/18 0331  NA 137 138  K 3.6 3.6  CL 99 103  CO2 24 27  GLUCOSE 131* 119*  BUN 31* 30*  CREATININE 2.25* 2.17*  CALCIUM 8.5* 8.3*    PT/INR:  Recent Labs    07/03/18 0331  LABPROT 15.8*  INR 1.27   ABG    Component Value Date/Time   PHART 7.319 (L) 06/27/2018 1949   HCO3 20.7 06/27/2018 1949   TCO2 22 06/27/2018 1949   ACIDBASEDEF 5.0 (H) 06/27/2018 1949   O2SAT 95.0 06/27/2018 1949   CBG (last 3)  No results for input(s): GLUCAP in the last 72 hours.  Assessment/Plan: S/P Procedure(s) (LRB): CORONARY ARTERY BYPASS GRAFTING (CABG), ON PUMP, TIMES FOUR, USING LEFT INTERNAL MAMMARY ARTERY AND  ENDOSCOPICALLY HARVESTED LEFT GREATER SAPHENOUS VEIN (N/A) TRANSESOPHAGEAL ECHOCARDIOGRAM (TEE) (N/A)  1. CV- NSR with PVCs- continue Amiodarone, Lopressor 2. INR 1.27, will increase coumadin to 5 mg daily 3. Pulm- mild left pleural effusion, + atelectasis, no need for Thoracentesis 4. Renal- creatinine stable at 2.17, K is WNL, on low dose Lasix 5. GI- small BM, + gas, some abdominal pain... will add lactulose prn 6. Deconditioning- home health arrangements have been made 7. Dispo- patient stable, will increase coumadin to 5 mg daily, continue lasix, lactulose prn, home once INR is trending up and coumadin dose can be finalized   LOS: 12 days    Ellwood Handler 07/03/2018 Discouraged, wants to go home In SR. QTc prolonged per monitor- will check 12 lead to get a more definitive assessment Home tomorrow if rhythm stable  Remo Lipps C. Roxan Hockey, MD [facility] [phone]

## 2018-07-04 LAB — BASIC METABOLIC PANEL
Anion gap: 7 (ref 5–15)
BUN: 27 mg/dL — AB (ref 8–23)
CO2: 27 mmol/L (ref 22–32)
Calcium: 8.2 mg/dL — ABNORMAL LOW (ref 8.9–10.3)
Chloride: 103 mmol/L (ref 98–111)
Creatinine, Ser: 2.22 mg/dL — ABNORMAL HIGH (ref 0.61–1.24)
GFR calc Af Amer: 31 mL/min — ABNORMAL LOW (ref >60)
GFR calc non Af Amer: 27 mL/min — ABNORMAL LOW (ref >60)
Glucose, Bld: 118 mg/dL — ABNORMAL HIGH (ref 70–99)
Potassium: 3.5 mmol/L (ref 3.5–5.1)
SODIUM: 137 mmol/L (ref 135–145)

## 2018-07-04 LAB — PROTIME-INR
INR: 1.38
Prothrombin Time: 16.8 seconds — ABNORMAL HIGH (ref 11.4–15.2)

## 2018-07-04 MED ORDER — AMIODARONE HCL 200 MG PO TABS: 200.0000 mg | ORAL_TABLET | Freq: Two times a day (BID) | ORAL | 1 refills | Status: DC

## 2018-07-04 MED ORDER — AMIODARONE HCL 200 MG PO TABS: 200.0000 mg | ORAL_TABLET | Freq: Two times a day (BID) | ORAL | Status: DC

## 2018-07-04 MED ORDER — OXYCODONE-ACETAMINOPHEN 7.5-325 MG PO TABS: 1.0000 | ORAL_TABLET | ORAL | 0 refills | Status: DC | PRN

## 2018-07-04 MED ORDER — METOPROLOL TARTRATE 50 MG PO TABS
50.0000 mg | ORAL_TABLET | Freq: Two times a day (BID) | ORAL | Status: DC
  Administered 2018-07-04: 50 mg via ORAL
  Filled 2018-07-04: qty 1

## 2018-07-04 MED ORDER — METOPROLOL TARTRATE 50 MG PO TABS: 50.0000 mg | ORAL_TABLET | Freq: Two times a day (BID) | ORAL | 3 refills | Status: DC

## 2018-07-04 MED ORDER — WARFARIN SODIUM 5 MG PO TABS: 5.0000 mg | ORAL_TABLET | Freq: Every day | ORAL | Status: DC

## 2018-07-04 MED ORDER — ASPIRIN 81 MG PO TBEC: 81.0000 mg | DELAYED_RELEASE_TABLET | Freq: Every day | ORAL | Status: DC

## 2018-07-04 MED ORDER — WARFARIN SODIUM 5 MG PO TABS: 5.0000 mg | ORAL_TABLET | Freq: Every day | ORAL | 3 refills | Status: DC

## 2018-07-04 NOTE — Progress Notes (Signed)
Chest tube sutures remover per order.

## 2018-07-04 NOTE — Progress Notes (Signed)
CARDIAC REHAB PHASE I   Pt ambulating hallway with wife, HR in the 75s. Pt denies CP or SOB, states he feels his best today. D/c education completed with pt and wife. Pt instructed to shower and monitor incisions daily. Encouraged continued IS use, walks, and sternal precautions. Pt given in-the-tube sheet and heart healthy diet. Reviewed restrictions and exercise guidelines. Will refer to CRP II [place].   [id number] Rufina Falco, RN BSN 07/04/2018 11:38 AM

## 2018-07-04 NOTE — Care Management Note (Signed)
Case Management Note Original Note Created Bethena Roys, RN 06/25/2018, 12:43 PM   Patient Details  Name: Kevin [name] MRN: [id number] Date of Birth: Nov 07, 1940  Subjective/Objective:  Pt presented for Chest Pain- Plavix washout and plan for CABG 06-27-18. PTA independent from home with wife. Pt uses a recliner in the home 2/2 shoulder issues. Wife had concerns that he will probably do better with a hospital bed in the home.                    Action/Plan: CM will continue to monitor for additional transition of care needs and DME.   Expected Discharge Date:                  Expected Discharge Plan:  [place]  In-House Referral:  NA  Discharge planning Services  CM Consult  Post Acute Care Choice:  Durable Medical Equipment, Home Health Choice offered to:  Patient, Spouse  DME Arranged:    DME Agency:  NA  HH Arranged:  RN, PT [place] Agency:  [place]  Status of Service:  Completed, signed off  If discussed at [place] of Stay Meetings, dates discussed:    Discharge Disposition: home/home health   Additional Comments:  07/04/18- 1100- Kevin Brashears RN, CM- f/u done with pt and wife at bedside for [facility] choice- per wife they would prefer to use [facility] as first choice and if unable to accept referral then [facility] as alternate. Call made to [facility] with [facility] for [facility] referral- HHRN/PT- referral has been accepted. Pt is hopeful for discharge today pending cardiology clearance. No further needs identified.   07/02/18- 1430- Kevin Gibbons RN, CM- Pt s/p CABGx3 on 06/28/18, plan to return home with wife, orders have been placed for HHRN/PT, CM spoke with pt and wife at bedside, list for [facility] provided to pt Per CMS guidelines from medicare.gov website with star ratings (copy placed in shadow chart)- wife would like to look over list and let CM know of choice in am, per pt he has needed DME at home, and no other needs noted- CM will f/u in am for [facility]  referral needs  Kevin [name], [name], RN 07/04/2018, 10:58 AM [phone] [id number] Transitions of Care RN Case Manager

## 2018-07-04 NOTE — Progress Notes (Addendum)
      [address]       [place] [address]             [phone]      7 Days Post-Op Procedure(s) (LRB): CORONARY ARTERY BYPASS GRAFTING (CABG), ON PUMP, TIMES FOUR, USING LEFT INTERNAL MAMMARY ARTERY AND ENDOSCOPICALLY HARVESTED LEFT GREATER SAPHENOUS VEIN (N/A) TRANSESOPHAGEAL ECHOCARDIOGRAM (TEE) (N/A)   Subjective:  No new complaints.  Family is at bedside and really wants him home as they feel he is not getting any care here that they cant provide.  Objective: Vital signs in last 24 hours: Temp:  [98.9 F (37.2 C)-99.5 F (37.5 C)] 99 F (37.2 C) (02/05 0413) Pulse Rate:  [63-73] 67 (02/05 0413) Cardiac Rhythm: Normal sinus rhythm;Bundle branch block (02/05 0705) Resp:  [17-19] 17 (02/05 0413) BP: (122-155)/(56-71) 155/71 (02/05 0413) SpO2:  [91 %-92 %] 91 % (02/05 0413) Weight:  [79.2 kg] 79.2 kg (02/05 0413)  Intake/Output from previous day: 02/04 0701 - 02/05 0700 In: 580 [P.O.:580] Out: -   General appearance: alert, cooperative and no distress Heart: regular rate and rhythm Lungs: clear to auscultation bilaterally Abdomen: soft, non-tender; bowel sounds normal; no masses,  no organomegaly Extremities: edema none present Wound: clean and dry  Lab Results: Recent Labs    07/02/18 0249  WBC 10.8*  HGB 9.1*  HCT 27.1*  PLT 325   BMET:  Recent Labs    07/03/18 0331 07/04/18 0358  NA 138 137  K 3.6 3.5  CL 103 103  CO2 27 27  GLUCOSE 119* 118*  BUN 30* 27*  CREATININE 2.17* 2.22*  CALCIUM 8.3* 8.2*    PT/INR:  Recent Labs    07/04/18 0358  LABPROT 16.8*  INR 1.38   ABG    Component Value Date/Time   PHART 7.319 (L) 06/27/2018 1949   HCO3 20.7 06/27/2018 1949   TCO2 22 06/27/2018 1949   ACIDBASEDEF 5.0 (H) 06/27/2018 1949   O2SAT 95.0 06/27/2018 1949   CBG (last 3)  No results for input(s): GLUCAP in the last 72 hours.  Assessment/Plan: S/P Procedure(s) (LRB): CORONARY ARTERY BYPASS GRAFTING (CABG), ON PUMP,  TIMES FOUR, USING LEFT INTERNAL MAMMARY ARTERY AND ENDOSCOPICALLY HARVESTED LEFT GREATER SAPHENOUS VEIN (N/A) TRANSESOPHAGEAL ECHOCARDIOGRAM (TEE) (N/A)  1. CV- NSR with PVCs, RBBB- QTC is prolonged as high as >600 at times- will stop Amiodarone, will increase Lopressor to 50 mg BID 2. INR 1.38, will repeat coumadin at 2.5 mg daily 3. Pulm- no acute issues, continue IS 4. Renal- creatinine is at baseline, weight is at baseline, Lasix can prolong Qtc, on 20 mg daily currently 5. Deconditioning- home health arrangements have been made 6. Dispo- patient remains in NSR with PVCs, Qtc is prolonged at over 600 at times, stop Amiodarone, increase Lopressor, continue coumadin, will discuss discharge plan with Dr. Roxan Hockey   LOS: 13 days    Ellwood Handler 07/04/2018 Patient seen and examined, agree with above Watching monitor, the QT is all over the place. Has a RBBB which will affect QTc. Will check a 12 lead ECG to get a more accurate reading and ask Cardiology to evaluate Home if [facility] with Cardiology  Remo Lipps C. Roxan Hockey, MD [facility] [phone]

## 2018-07-04 NOTE — Progress Notes (Signed)
Larena Sox to be D/C'd Home per MD order. Discussed with the patient and all questions fully answered.    IV catheter discontinued intact. Site without signs and symptoms of complications. Dressing and pressure applied.  An After Visit Summary was printed and given to the patient.  Patient escorted via [place], and D/C home via private auto.  Cyndra Numbers  07/04/2018 2:37 PM

## 2018-07-04 NOTE — Progress Notes (Addendum)
   Reviewed telemetry and 12-lead EKG. Discussed with MD and EP APP, Tommye Standard PA-C, and we feel like this does not represent true QTc prolongation. May be okay to continue Amiodarone. OK for discharge from Cardiology standpoint.   [facility] will sign off.   Medication Recommendations:   - Continue Aspirin 81mg  daily, Lipitor 20mg  daily, and Lopressor 50mg  twice daily.  Other recommendations (labs, testing, etc):  None. Follow up as an outpatient: Patient has follow-up appointment scheduled for 07/12/2018 at 10:00am with Vin Bhagat, PA-C.  Darreld Mclean, PA-C 07/04/2018 12:10 PM  Agree with above recommendations. Discussed with Ellwood Handler PAC.  Will continue amiodarone at 200 mg BID for 5 days and transition to 200 mg daily until follow up given atrial fibrillation.  Discussed with patient and family who are very eager to leave hospital.  Exam: Gen: NAD CV: RRR Lungs: clear Extrem: no edema.  Elouise Munroe 07/04/18 1:53 PM

## 2018-07-05 ENCOUNTER — Telehealth: Payer: Self-pay | Admitting: Family Medicine

## 2018-07-05 DIAGNOSIS — I130 Hypertensive heart and chronic kidney disease with heart failure and stage 1 through stage 4 chronic kidney disease, or unspecified chronic kidney disease: Secondary | ICD-10-CM | POA: Diagnosis not present

## 2018-07-05 DIAGNOSIS — N183 Chronic kidney disease, stage 3 (moderate): Secondary | ICD-10-CM | POA: Diagnosis not present

## 2018-07-05 DIAGNOSIS — Z951 Presence of aortocoronary bypass graft: Secondary | ICD-10-CM | POA: Diagnosis not present

## 2018-07-05 DIAGNOSIS — M1990 Unspecified osteoarthritis, unspecified site: Secondary | ICD-10-CM | POA: Diagnosis not present

## 2018-07-05 DIAGNOSIS — I5030 Unspecified diastolic (congestive) heart failure: Secondary | ICD-10-CM | POA: Diagnosis not present

## 2018-07-05 DIAGNOSIS — I4891 Unspecified atrial fibrillation: Secondary | ICD-10-CM | POA: Diagnosis not present

## 2018-07-05 DIAGNOSIS — I739 Peripheral vascular disease, unspecified: Secondary | ICD-10-CM | POA: Diagnosis not present

## 2018-07-05 DIAGNOSIS — G894 Chronic pain syndrome: Secondary | ICD-10-CM | POA: Diagnosis not present

## 2018-07-05 DIAGNOSIS — I2510 Atherosclerotic heart disease of native coronary artery without angina pectoris: Secondary | ICD-10-CM | POA: Diagnosis not present

## 2018-07-05 DIAGNOSIS — Z48812 Encounter for surgical aftercare following surgery on the circulatory system: Secondary | ICD-10-CM | POA: Diagnosis not present

## 2018-07-05 NOTE — Telephone Encounter (Signed)
See note  Copied from [place] [phone]. Topic: General - Other >> Jul 05, 2018 11:43 AM Yvette Rack wrote: Reason for CRM: Levada Dy with [facility] stated that they received the request for a hospital bed back, but it does not indicate a semi electric hospital bed. Levada Dy stated that the request would need to specify semi electric hospital bed and it would need to be signed and dated with the doctor's NPI number. Cb# [phone] Ext. [phone]

## 2018-07-06 ENCOUNTER — Ambulatory Visit (INDEPENDENT_AMBULATORY_CARE_PROVIDER_SITE_OTHER): Payer: 59 | Admitting: *Deleted

## 2018-07-06 DIAGNOSIS — Z5181 Encounter for therapeutic drug level monitoring: Secondary | ICD-10-CM | POA: Diagnosis not present

## 2018-07-06 DIAGNOSIS — I4891 Unspecified atrial fibrillation: Secondary | ICD-10-CM | POA: Diagnosis not present

## 2018-07-06 DIAGNOSIS — Z951 Presence of aortocoronary bypass graft: Secondary | ICD-10-CM | POA: Diagnosis not present

## 2018-07-06 LAB — POCT INR: INR: 2.1 (ref 2.0–3.0)

## 2018-07-06 NOTE — Patient Instructions (Addendum)
A full discussion of the nature of anticoagulants has been carried out.  A benefit risk analysis has been presented to the patient, so that they understand the justification for choosing anticoagulation at this time. The need for frequent and regular monitoring, precise dosage adjustment and compliance is stressed.  Side effects of potential bleeding are discussed.  The patient should avoid any OTC items containing aspirin or ibuprofen, and should avoid great swings in general diet.  Avoid alcohol consumption.  Call if any signs of abnormal bleeding.  Description   Start taking 1/2 tablet daily. Recheck INR on Tuesday. [facility] [phone] Main [phone]

## 2018-07-09 ENCOUNTER — Telehealth: Payer: Self-pay

## 2018-07-09 DIAGNOSIS — M1990 Unspecified osteoarthritis, unspecified site: Secondary | ICD-10-CM | POA: Diagnosis not present

## 2018-07-09 DIAGNOSIS — N183 Chronic kidney disease, stage 3 (moderate): Secondary | ICD-10-CM | POA: Diagnosis not present

## 2018-07-09 DIAGNOSIS — I4891 Unspecified atrial fibrillation: Secondary | ICD-10-CM | POA: Diagnosis not present

## 2018-07-09 DIAGNOSIS — G894 Chronic pain syndrome: Secondary | ICD-10-CM | POA: Diagnosis not present

## 2018-07-09 DIAGNOSIS — I130 Hypertensive heart and chronic kidney disease with heart failure and stage 1 through stage 4 chronic kidney disease, or unspecified chronic kidney disease: Secondary | ICD-10-CM | POA: Diagnosis not present

## 2018-07-09 DIAGNOSIS — I739 Peripheral vascular disease, unspecified: Secondary | ICD-10-CM | POA: Diagnosis not present

## 2018-07-09 DIAGNOSIS — Z48812 Encounter for surgical aftercare following surgery on the circulatory system: Secondary | ICD-10-CM | POA: Diagnosis not present

## 2018-07-09 DIAGNOSIS — I2510 Atherosclerotic heart disease of native coronary artery without angina pectoris: Secondary | ICD-10-CM | POA: Diagnosis not present

## 2018-07-09 DIAGNOSIS — I5030 Unspecified diastolic (congestive) heart failure: Secondary | ICD-10-CM | POA: Diagnosis not present

## 2018-07-09 NOTE — Telephone Encounter (Signed)
Kevin [name], PT with [facility] contacted the office requesting verbal orders for physical therapy 2 times a week for 2 weeks, and 1 time a week for 3 weeks.  Kevin [name] is s/p CABG x4 06/27/2018 with Dr. Roxan Hockey.  Verbal orders given via secure voicemail.  Will await faxed copy for physician signature.

## 2018-07-10 ENCOUNTER — Ambulatory Visit (INDEPENDENT_AMBULATORY_CARE_PROVIDER_SITE_OTHER): Payer: 59

## 2018-07-10 DIAGNOSIS — G894 Chronic pain syndrome: Secondary | ICD-10-CM | POA: Diagnosis not present

## 2018-07-10 DIAGNOSIS — N183 Chronic kidney disease, stage 3 (moderate): Secondary | ICD-10-CM | POA: Diagnosis not present

## 2018-07-10 DIAGNOSIS — Z951 Presence of aortocoronary bypass graft: Secondary | ICD-10-CM | POA: Diagnosis not present

## 2018-07-10 DIAGNOSIS — I4891 Unspecified atrial fibrillation: Secondary | ICD-10-CM | POA: Diagnosis not present

## 2018-07-10 DIAGNOSIS — Z5181 Encounter for therapeutic drug level monitoring: Secondary | ICD-10-CM | POA: Diagnosis not present

## 2018-07-10 DIAGNOSIS — Z48812 Encounter for surgical aftercare following surgery on the circulatory system: Secondary | ICD-10-CM | POA: Diagnosis not present

## 2018-07-10 DIAGNOSIS — I5030 Unspecified diastolic (congestive) heart failure: Secondary | ICD-10-CM | POA: Diagnosis not present

## 2018-07-10 DIAGNOSIS — I130 Hypertensive heart and chronic kidney disease with heart failure and stage 1 through stage 4 chronic kidney disease, or unspecified chronic kidney disease: Secondary | ICD-10-CM | POA: Diagnosis not present

## 2018-07-10 DIAGNOSIS — I739 Peripheral vascular disease, unspecified: Secondary | ICD-10-CM | POA: Diagnosis not present

## 2018-07-10 DIAGNOSIS — I2510 Atherosclerotic heart disease of native coronary artery without angina pectoris: Secondary | ICD-10-CM | POA: Diagnosis not present

## 2018-07-10 DIAGNOSIS — M1990 Unspecified osteoarthritis, unspecified site: Secondary | ICD-10-CM | POA: Diagnosis not present

## 2018-07-10 LAB — POCT INR: INR: 2.4 (ref 2.0–3.0)

## 2018-07-10 NOTE — Patient Instructions (Signed)
Spoke w/ Eustaquio Maize, RN w/ [facility] [place].   Advised her to have pt continue taking 1/2 tablet daily.  Recheck INR next Monday by Home Health. Cou[facility] Clinic [phone] Main [phone]

## 2018-07-11 NOTE — Progress Notes (Signed)
Cardiology Office Note    Date:  07/12/2018   ID:  Kevin [name], DOB 07-May-1941, MRN [id number]  PCP:  Marin Olp, MD  Cardiologist:  Dr. Harrington Challenger EP: Dr. Curt Bears  Chief Complaint: Hospital follow up   History of Present Illness:   Kevin [name] is a 78 y.o. male history of  hypertension, hyperlipidemia, bradycardia, open AAA repair, bilateral carotid artery occlusion s/p endarterectomy, TIA x2 on Plavix, rheumatoid arthritis, CKD stage III-IV and recent CABG presents for follow up.   Previously followed by Dr. Curt Bears for dizziness.  Felt to be related to orthostasis given bradycardia and carotid vascular disease.  Admitted January 2020 for chest pain concerning for angina. Left heart catheterization showed severe multivessel CAD with 100% stenosis of proximal RCA (left to right collaterals present), 80% of 2nd Mrg, 90% stenosis of Ost 1st Diag to 1st Diag, and 80% of proximal LAD.  Patient was seen by surgery and subsequently underwent CABG x4 (LIMA to LAD, SVG to first diagonal, SVG to OM 2, SVG to posterior descending).  Hospital course complicated by asymptomatic intermittent atrial fibrillation.  Increase metoprolol and started on oral amiodarone.  No bradycardia noted despite prior history remotely.  Started on warfarin for anticoagulation.  Recommended duration of anticoagulation given atrial fibrillation burden.  Here today for follow-up.  Recovering well.  Patient has intermittent musculoskeletal pain nothing like that made him to go in hospital.  He has left lower extremity swelling at vein harvest site.  He denies orthopnea, PND, syncope, dizziness or melena.  He has fatigue which gradually improving.  Past Medical History:  Diagnosis Date  . Arthritis    DDD- Lower Back  . Bradycardia   . CAP (community acquired pneumonia) 03/17/2015   "THAT'S WHAT THEY ARE THINKING; THEY ARE NOT SURE"  . Carotid artery occlusion   . Chest pain 06/19/2018  . Chronic lower back pain    DDD  . CKD (chronic kidney disease)    Dr. Corliss Parish  . Diverticulosis   . Enlarged prostate    takes Flomax daily  . History of blood transfusion    "probably when they did aortic aneurysm"  . History of colon polyps    benign  . Hyperlipidemia   . Hypertension    takes Amlodipine and Zebeta daily  . Insomnia    takes Melatonin nightly  . Joint pain   . 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     . Muscle spasm    takes Zanaflex daily as needed  . Peripheral edema    takes Furosemide daily  . Pneumonia    hx of   . Rheumatic fever 1946  . Rheumatoid arthritis ([place])   . Short-term memory loss    minimal  . Shortness of breath dyspnea    with exertion but lasts very short period of time  . TIA (transient ischemic attack)    x 2;takes Plavix daily  . Urinary frequency    takes Flomax daily  . Urinary urgency     Past Surgical History:  Procedure Laterality Date  . ABDOMINAL AORTIC ANEURYSM REPAIR  2010  . CAROTID ENDARTERECTOMY Left 09/20/2004  . CATARACT EXTRACTION, BILATERAL     2016-2017  . COLONOSCOPY    . CORONARY ARTERY BYPASS GRAFT N/A 06/27/2018   Procedure: CORONARY ARTERY BYPASS GRAFTING (CABG), ON PUMP, TIMES FOUR, USING LEFT INTERNAL MAMMARY ARTERY AND ENDOSCOPICALLY HARVESTED LEFT GREATER SAPHENOUS VEIN;  Surgeon: Melrose Nakayama, MD;  Location: [facility];  Service: Open Heart Surgery;  Laterality: N/A;  . ENDARTERECTOMY Right 10/12/2015   Procedure: RIGHT CAROTID ENDARTERECTOMY WITH PATCH ANGIOPLASTY;  Surgeon: Elam Dutch, MD;  Location: [facility];  Service: Vascular;  Laterality: Right;  . INGUINAL HERNIA REPAIR Left 02/05/2013   Procedure: HERNIA REPAIR INGUINAL ADULT;  Surgeon: Joyice Faster. Cornett, MD;  Location: [facility];  Service: General;  Laterality: Left;  . INGUINAL HERNIA REPAIR Left   . INSERTION OF MESH Left 02/05/2013   Procedure: INSERTION OF MESH;  Surgeon: Joyice Faster. Cornett, MD;  Location: [facility];  Service: General;  Laterality: Left;  . LEFT HEART CATH AND CORONARY ANGIOGRAPHY N/A 06/21/2018   Procedure: LEFT HEART CATH AND CORONARY ANGIOGRAPHY;  Surgeon: Jettie Booze, MD;  Location: [facility];  Service: Cardiovascular;  Laterality: N/A;  . SHOULDER ARTHROSCOPY WITH ROTATOR CUFF REPAIR AND SUBACROMIAL DECOMPRESSION Left 05/01/2014   Procedure: LEFT ARTHROSCOPY SHOULDER SUBACROMIAL DECOMPRESSION,DISTAL CLAVICAL RESECTION AND ROTATOR CUFF REPAIR;  Surgeon: Marin Shutter, MD;  Location: [facility];  Service: Orthopedics;  Laterality: Left;  . TEE WITHOUT CARDIOVERSION N/A 06/27/2018   Procedure: TRANSESOPHAGEAL ECHOCARDIOGRAM (TEE);  Surgeon: Melrose Nakayama, MD;  Location: [facility];  Service: Open Heart Surgery;  Laterality: N/A;  . TESTICLAR CYST EXCISION Left   . TONSILLECTOMY    . VASECTOMY      Current Medications: Prior to Admission medications   Medication Sig Start Date End Date Taking? Authorizing Provider  albuterol (VENTOLIN HFA) 108 (90 Base) MCG/ACT inhaler Inhale 2 puffs into the lungs every 6 (six) hours as needed for wheezing or shortness of breath.    [provider]  amiodarone (PACERONE) 200 MG tablet Take 1 tablet (200 mg total) by mouth 2 (two) times daily. For 10 days, then decrease to 200 mg daily 07/04/18   Barrett, Lodema Hong, PA-C  aspirin EC 81 MG EC tablet Take 1 tablet (81 mg total) by mouth daily. 07/05/18   Barrett, Erin R, PA-C  atorvastatin (LIPITOR) 20 MG tablet TAKE 1 TABLET EVERY DAY Patient taking differently: Take 20 mg by mouth daily.  03/13/18   Marin Olp, MD  BIOTIN PO Take 1 tablet by mouth daily.    [provider]  Cholecalciferol (VITAMIN D-3) 1000 UNITS CAPS Take 1,000 Units by mouth daily.     [provider]  diazepam (VALIUM) 5 MG tablet TAKE 1/2 TO 1 TABLET EVERY 8 (EIGHT) HOURS AS NEEDED FOR MUSCLE SPASMS OR SEDATION. Patient taking differently: Take 2.5-5 mg by mouth at  bedtime.  04/17/18   Marin Olp, MD  fenofibrate 160 MG tablet TAKE 1 TABLET EVERY DAY Patient taking differently: Take 160 mg by mouth daily.  03/27/18   Marin Olp, MD  furosemide (LASIX) 20 MG tablet TAKE 1 TABLET EVERY OTHER DAY Patient taking differently: Take 20 mg by mouth every other day.  03/27/18   Marin Olp, MD  gabapentin (NEURONTIN) 300 MG capsule TAKE 1 CAPSULE TWICE DAILY Patient taking differently: Take 300 mg by mouth 2 (two) times daily.  02/20/18   Marin Olp, MD  Melatonin 10 MG TABS Take 10 mg by mouth at bedtime.     [provider]  metoprolol tartrate (LOPRESSOR) 50 MG tablet Take 1 tablet (50 mg total) by mouth 2 (two) times daily. 07/04/18   Barrett, Lodema Hong, PA-C  oxyCODONE-acetaminophen (PERCOCET) 7.5-325 MG tablet Take 1 tablet by mouth every  4 (four) hours as needed for severe pain. 07/04/18 07/04/19  Barrett, Erin R, PA-C  tamsulosin (FLOMAX) 0.4 MG CAPS capsule TAKE 1 CAPSULE EVERY DAY Patient taking differently: Take 0.4 mg by mouth at bedtime.  03/27/18   Marin Olp, MD  tiZANidine (ZANAFLEX) 4 MG tablet TAKE 1 TABLET EVERY 6 HOURS AS NEEDED FOR MUSCLE SPASM(S) Patient taking differently: Take 4 mg by mouth at bedtime.  05/21/18   Marin Olp, MD  triamcinolone cream (KENALOG) 0.1 % APPLY 1 APPLICATION 2 TIMES DAILY TOPICALLY FOR 7-10 DAYS Patient taking differently: Apply 1 application topically 2 (two) times daily.  07/31/17   Marin Olp, MD  vitamin B-12 (CYANOCOBALAMIN) 1000 MCG tablet Take 1,000 mcg by mouth daily.    [provider]  warfarin (COUMADIN) 5 MG tablet Take 1 tablet (5 mg total) by mouth daily at 6 PM. 07/04/18   Barrett, Erin R, PA-C    Allergies:   Patient has no known allergies.   Social History   Socioeconomic History  . Marital status: Married    Spouse name: Not on file  . Number of children: Not on file  . Years of education: Not on file  . Highest education level: Not on  file  Occupational History  . Occupation: retired    Comment: maintenance; electrician  Social Needs  . Financial resource strain: Not on file  . Food insecurity:    Worry: Not on file    Inability: Not on file  . Transportation needs:    Medical: Not on file    Non-medical: Not on file  Tobacco Use  . Smoking status: Former Smoker    Packs/day: 2.00    Years: 28.00    Pack years: 56.00    Types: Cigarettes    Last attempt to quit: 05/31/1987    Years since quitting: 31.1  . Smokeless tobacco: Never Used  . Tobacco comment: quit smoking "in my 41's"  Substance and Sexual Activity  . Alcohol use: Yes    Alcohol/week: 0.0 standard drinks    Comment: rare, mikes hard lemonade  . Drug use: No    Types: Marijuana    Comment: hx of-4-5 yrs ago  . Sexual activity: Not Currently  Lifestyle  . Physical activity:    Days per week: Not on file    Minutes per session: Not on file  . Stress: Not on file  Relationships  . Social connections:    Talks on phone: Not on file    Gets together: Not on file    Attends religious service: Not on file    Active member of club or organization: Not on file    Attends meetings of clubs or organizations: Not on file    Relationship status: Not on file  Other Topics Concern  . Not on file  Social History Narrative   Married 33 years in 2015. Lived together for 12 years. 2 boys from first wife. 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       Hobbies: yardwork, [place] barn, woodwork, Orthoptist     Family History:  The patient's family history includes Cancer in his father and mother; Colon cancer in his mother; Dementia in his father; Parkinsonism in his father.   ROS:   Please see the history of present illness.  ROS All other systems reviewed and are negative.   PHYSICAL EXAM:   VS:  BP 120/68   Pulse (!) 53   Ht  5\' 7"  (1.702 m)   Wt 175 lb 3.2 oz (79.5 kg)   SpO2 97%   BMI 27.44 kg/m    GEN: Well nourished, well developed, in no acute distress  HEENT: normal  Neck: no JVD, carotid bruits, or masses Cardiac:RRR; no murmurs, rubs, or gallops sternal surgical site healing well.  Left lower extremity with 1+ edema. Respiratory:  clear to auscultation bilaterally, normal work of breathing GI: soft, nontender, nondistended, + BS MS: no deformity or atrophy  Skin: warm and dry, no rash Neuro:  Alert and Oriented x 3, Strength and sensation are intact Psych: euthymic mood, full affect  Wt Readings from Last 3 Encounters:  07/12/18 175 lb 3.2 oz (79.5 kg)  07/04/18 174 lb 9.7 oz (79.2 kg)  05/15/18 180 lb (81.6 kg)      Studies/Labs Reviewed:   EKG:  EKG is ordered today.  The ekg ordered today demonstrates sinus bradycardia at rate of 53 bpm.  Recent Labs: 06/26/2018: ALT 16 07/02/2018: Hemoglobin 9.1; Magnesium 2.1; Platelets 325 07/04/2018: BUN 27; Creatinine, Ser 2.22; Potassium 3.5; Sodium 137   Lipid Panel    Component Value Date/Time   CHOL 113 06/20/2018 0519   TRIG 177 (H) 06/20/2018 0519   HDL 23 (L) 06/20/2018 0519   CHOLHDL 4.9 06/20/2018 0519   VLDL 35 06/20/2018 0519   LDLCALC 55 06/20/2018 0519   LDLDIRECT 134.9 12/26/2012 1201    Additional studies/ records that were reviewed today include:    Echocardiogram June 20, 2018  - Left ventricle: The cavity size was normal. There was mild concentric hypertrophy. Systolic function was normal. The estimated ejection fraction was in the range of 60% to 65%. Wall motion was normal; there were no regional wall motion abnormalities. Doppler parameters are consistent with abnormal left ventricular relaxation (grade 1 diastolic dysfunction). Doppler parameters are consistent with high ventricular filling pressure. - Aortic valve: Transvalvular velocity was within the normal range. There was no stenosis. There  was no regurgitation. Valve area (VTI): 2.17 cm^2. Valve area (Vmean): 2.45 cm^2. - Mitral valve: Mildly calcified annulus. Transvalvular velocity was within the normal range. There was no evidence for stenosis. There was no regurgitation. - Left atrium: The atrium was mildly dilated. - Right ventricle: The cavity size was normal. Wall thickness was normal. Systolic function was normal. - Tricuspid valve: There was trivial regurgitation. - Pulmonary arteries: Systolic pressure was within the normal range. PA peak pressure: 24 mm Hg (S).  LEFT HEART CATH AND CORONARY ANGIOGRAPHY 06/21/18  Conclusion     Prox RCA lesion is 100% stenosed. Left to right collaterals.  2nd Mrg lesion is 80% stenosed.  Ost 1st Diag to 1st Diag lesion is 90% stenosed.  Prox LAD lesion is 80% stenosed.  LV end diastolic pressure is normal.  There is no aortic valve stenosis.   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  Severe disease in a large diagonal.  Eccentric lesion in an OM, best seen in the AP caudal view.  Chronically occluded dominant RCA.        ASSESSMENT & PLAN:    1. CAD status post CABG x4 -No anginal pain.  He has intermittent musculoskeletal pain.  Continue aspirin, statin and beta-blocker.  2.  Paroxysmal atrial fibrillation -maintaining sinus rhythn. On metoprolol and amiodarone.  On Coumadin  for anticoagulation.  Denies any bleeding.  He was anemic in the hospital. Check CBC.   Prior history of bradycardia.  His heart rate in the 50s and complains of fatigue.  Will reduce metoprolol tartrate to 25 mg twice daily.  Likely will get monitor at follow-up to determine AF burden and duration of anticoagulation.  3 HLD - 06/20/2018: Cholesterol 113; HDL 23; LDL Cholesterol 55; Triglycerides 177; VLDL 35  -Continue statin.  4.  Chronic kidney disease stage IV -Check bmet today.  He was taking Lasix every other day prior to admission.  Currently on  daily dose.  Likely reduced to previous dose pending lab.  5. LLE edema - Likely due to surgery     Medication Adjustments/Labs and Tests Ordered: Current medicines are reviewed at length with the patient today.  Concerns regarding medicines are outlined above.  Medication changes, Labs and Tests ordered today are listed in the Patient Instructions below. Patient Instructions  Medication Instructions:  Your physician recommends that you continue on your current medications as directed. Please refer to the Current Medication list given to you today.  If you need a refill on your cardiac medications before your next appointment, please call your pharmacy.   Lab work: None ordered  If you have labs (blood work) drawn today and your tests are completely normal, you will receive your results only by: Marland Kitchen MyChart Message (if you have MyChart) OR . A paper copy in the mail If you have any lab test that is abnormal or we need to change your treatment, we will call you to review the results.  Testing/Procedures: None ordered  Follow-Up: Your physician recommends that you schedule a follow-up appointment in:    Any Other Special Instructions Will Be Listed Below (If Applicable).       Jarrett Soho, [place]  07/12/2018 10:15 AM    [address], [place], [place]  [address] Phone: [phone]; Fax: [phone]

## 2018-07-12 ENCOUNTER — Ambulatory Visit: Payer: Medicare HMO | Admitting: Physical Medicine & Rehabilitation

## 2018-07-12 ENCOUNTER — Telehealth: Payer: Self-pay | Admitting: *Deleted

## 2018-07-12 ENCOUNTER — Encounter: Payer: Self-pay | Admitting: Physical Medicine & Rehabilitation

## 2018-07-12 ENCOUNTER — Encounter: Payer: Medicare HMO | Attending: Physical Medicine & Rehabilitation

## 2018-07-12 ENCOUNTER — Ambulatory Visit: Payer: Medicare HMO | Admitting: Physician Assistant

## 2018-07-12 ENCOUNTER — Encounter: Payer: Self-pay | Admitting: Physician Assistant

## 2018-07-12 VITALS — BP 120/68 | HR 53 | Ht 67.0 in | Wt 175.2 lb

## 2018-07-12 VITALS — BP 141/59 | HR 53 | Ht 67.5 in | Wt 174.0 lb

## 2018-07-12 DIAGNOSIS — I25700 Atherosclerosis of coronary artery bypass graft(s), unspecified, with unstable angina pectoris: Secondary | ICD-10-CM | POA: Diagnosis not present

## 2018-07-12 DIAGNOSIS — I4891 Unspecified atrial fibrillation: Secondary | ICD-10-CM | POA: Diagnosis not present

## 2018-07-12 DIAGNOSIS — M1712 Unilateral primary osteoarthritis, left knee: Secondary | ICD-10-CM | POA: Diagnosis not present

## 2018-07-12 DIAGNOSIS — M25551 Pain in right hip: Secondary | ICD-10-CM | POA: Diagnosis not present

## 2018-07-12 DIAGNOSIS — N184 Chronic kidney disease, stage 4 (severe): Secondary | ICD-10-CM | POA: Diagnosis not present

## 2018-07-12 DIAGNOSIS — Z951 Presence of aortocoronary bypass graft: Secondary | ICD-10-CM

## 2018-07-12 DIAGNOSIS — I480 Paroxysmal atrial fibrillation: Secondary | ICD-10-CM | POA: Diagnosis not present

## 2018-07-12 DIAGNOSIS — I10 Essential (primary) hypertension: Secondary | ICD-10-CM | POA: Diagnosis not present

## 2018-07-12 DIAGNOSIS — M47816 Spondylosis without myelopathy or radiculopathy, lumbar region: Secondary | ICD-10-CM | POA: Diagnosis not present

## 2018-07-12 DIAGNOSIS — G894 Chronic pain syndrome: Secondary | ICD-10-CM | POA: Diagnosis not present

## 2018-07-12 DIAGNOSIS — I739 Peripheral vascular disease, unspecified: Secondary | ICD-10-CM | POA: Insufficient documentation

## 2018-07-12 DIAGNOSIS — Z79899 Other long term (current) drug therapy: Secondary | ICD-10-CM

## 2018-07-12 DIAGNOSIS — M48061 Spinal stenosis, lumbar region without neurogenic claudication: Secondary | ICD-10-CM

## 2018-07-12 LAB — BASIC METABOLIC PANEL
BUN/Creatinine Ratio: 11 (ref 10–24)
BUN: 35 mg/dL — ABNORMAL HIGH (ref 8–27)
CHLORIDE: 97 mmol/L (ref 96–106)
CO2: 22 mmol/L (ref 20–29)
Calcium: 9.2 mg/dL (ref 8.6–10.2)
Creatinine, Ser: 3.05 mg/dL — ABNORMAL HIGH (ref 0.76–1.27)
GFR calc Af Amer: 21 mL/min/{1.73_m2} — ABNORMAL LOW (ref >59)
GFR calc non Af Amer: 19 mL/min/{1.73_m2} — ABNORMAL LOW (ref >59)
Glucose: 113 mg/dL — ABNORMAL HIGH (ref 65–99)
Potassium: 5.3 mmol/L — ABNORMAL HIGH (ref 3.5–5.2)
SODIUM: 135 mmol/L (ref 134–144)

## 2018-07-12 LAB — CBC
Hematocrit: 30 % — ABNORMAL LOW (ref 37.5–51.0)
Hemoglobin: 10.1 g/dL — ABNORMAL LOW (ref 13.0–17.7)
MCH: 30.7 pg (ref 26.6–33.0)
MCHC: 33.7 g/dL (ref 31.5–35.7)
MCV: 91 fL (ref 79–97)
Platelets: 586 10*3/uL — ABNORMAL HIGH (ref 150–450)
RBC: 3.29 x10E6/uL — ABNORMAL LOW (ref 4.14–5.80)
RDW: 13.1 % (ref 11.6–15.4)
WBC: 10.7 10*3/uL (ref 3.4–10.8)

## 2018-07-12 MED ORDER — OXYCODONE-ACETAMINOPHEN 7.5-325 MG PO TABS: 1.0000 | ORAL_TABLET | Freq: Three times a day (TID) | ORAL | 0 refills | Status: DC | PRN

## 2018-07-12 MED ORDER — METOPROLOL TARTRATE 25 MG PO TABS: 25.0000 mg | ORAL_TABLET | Freq: Two times a day (BID) | ORAL | 3 refills | Status: DC

## 2018-07-12 NOTE — Telephone Encounter (Signed)
See lab result note.

## 2018-07-12 NOTE — Progress Notes (Signed)
Subjective:    Patient ID: Kevin [name], male    DOB: 08-Apr-1941, 78 y.o.   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 oxycodone 7.5 mg twice daily or sometimes 3 times daily for his chronic low back pain.  He is average 70/month.  Interval hx CABG x 4 06/19/2018, discharged from the hospital on 07/04/2018.  Part of the prolonged hospitalization was waiting clopidogrel washout. Had no postoperative complications.  His wife is staying with him but will be going back to work at least on a part-time basis next week.  He is receiving home health services. He did receive 30 oxycodone 7.5 mg on 07/04/2018.  On discharge from the hospital Pain Inventory Average Pain 7 Pain Right Now 4 My pain is dull, tingling and aching  In the last 24 hours, has pain interfered with the following? General activity 7 Relation with others 7 Enjoyment of life 7 What TIME of day is your pain at its worst? na Sleep (in general) Fair  Pain is worse with: unsure and some activites Pain improves with: medication Relief from Meds: 5  Mobility walk with assistance use a walker  Function retired  Neuro/Psych tremor trouble walking  Prior Studies Any changes since last visit?  no  Physicians involved in your care Any changes since last visit?  no   Family History  Problem Relation Age of Onset  . Parkinsonism Father   . Dementia Father   . Cancer Father        Throat  . Colon cancer Mother        died in 65s  . Cancer Mother        Colon   Social History   Socioeconomic History  . Marital status: Married    Spouse name: Not on file  . Number of children: Not on file  . Years of education: Not on file  . Highest education level: Not on file  Occupational History  . Occupation: retired    Comment: maintenance; electrician  Social Needs  . Financial resource strain: Not on  file  . Food insecurity:    Worry: Not on file    Inability: Not on file  . Transportation needs:    Medical: Not on file    Non-medical: Not on file  Tobacco Use  . Smoking status: Former Smoker    Packs/day: 2.00    Years: 28.00    Pack years: 56.00    Types: Cigarettes    Last attempt to quit: 05/31/1987    Years since quitting: 31.1  . Smokeless tobacco: Never Used  . Tobacco comment: quit smoking "in my 20's"  Substance and Sexual Activity  . Alcohol use: Yes    Alcohol/week: 0.0 standard drinks    Comment: rare, mikes hard lemonade  . Drug use: No    Types: Marijuana    Comment: hx of-4-5 yrs ago  . Sexual activity: Not Currently  Lifestyle  . Physical activity:    Days per week: Not on file    Minutes per session: Not on file  . Stress: Not on file  Relationships  . Social connections:    Talks on phone: Not on file    Gets together: Not on file    Attends religious service: Not on file    Active member of club or organization: Not on  file    Attends meetings of clubs or organizations: Not on file    Relationship status: Not on file  Other Topics Concern  . Not on file  Social History Narrative   Married 33 years in 2015. Lived together for 12 years. 2 boys from first wife. 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       Hobbies: yardwork, [place] barn, woodwork, Orthoptist   Past Surgical History:  Procedure Laterality Date  . ABDOMINAL AORTIC ANEURYSM REPAIR  2010  . CAROTID ENDARTERECTOMY Left 09/20/2004  . CATARACT EXTRACTION, BILATERAL     2016-2017  . COLONOSCOPY    . CORONARY ARTERY BYPASS GRAFT N/A 06/27/2018   Procedure: CORONARY ARTERY BYPASS GRAFTING (CABG), ON PUMP, TIMES FOUR, USING LEFT INTERNAL MAMMARY ARTERY AND ENDOSCOPICALLY HARVESTED LEFT GREATER SAPHENOUS VEIN;  Surgeon: Melrose Nakayama, MD;  Location: [facility];  Service:  Open Heart Surgery;  Laterality: N/A;  . ENDARTERECTOMY Right 10/12/2015   Procedure: RIGHT CAROTID ENDARTERECTOMY WITH PATCH ANGIOPLASTY;  Surgeon: Elam Dutch, MD;  Location: [facility];  Service: Vascular;  Laterality: Right;  . INGUINAL HERNIA REPAIR Left 02/05/2013   Procedure: HERNIA REPAIR INGUINAL ADULT;  Surgeon: Joyice Faster. Cornett, MD;  Location: [facility];  Service: General;  Laterality: Left;  . INGUINAL HERNIA REPAIR Left   . INSERTION OF MESH Left 02/05/2013   Procedure: INSERTION OF MESH;  Surgeon: Joyice Faster. Cornett, MD;  Location: [facility];  Service: General;  Laterality: Left;  . LEFT HEART CATH AND CORONARY ANGIOGRAPHY N/A 06/21/2018   Procedure: LEFT HEART CATH AND CORONARY ANGIOGRAPHY;  Surgeon: Jettie Booze, MD;  Location: [facility];  Service: Cardiovascular;  Laterality: N/A;  . SHOULDER ARTHROSCOPY WITH ROTATOR CUFF REPAIR AND SUBACROMIAL DECOMPRESSION Left 05/01/2014   Procedure: LEFT ARTHROSCOPY SHOULDER SUBACROMIAL DECOMPRESSION,DISTAL CLAVICAL RESECTION AND ROTATOR CUFF REPAIR;  Surgeon: Marin Shutter, MD;  Location: [facility];  Service: Orthopedics;  Laterality: Left;  . TEE WITHOUT CARDIOVERSION N/A 06/27/2018   Procedure: TRANSESOPHAGEAL ECHOCARDIOGRAM (TEE);  Surgeon: Melrose Nakayama, MD;  Location: [facility];  Service: Open Heart Surgery;  Laterality: N/A;  . TESTICLAR CYST EXCISION Left   . TONSILLECTOMY    . VASECTOMY     Past Medical History:  Diagnosis Date  . Arthritis    DDD- Lower Back  . Bradycardia   . CAP (community acquired pneumonia) 03/17/2015   "THAT'S WHAT THEY ARE THINKING; THEY ARE NOT SURE"  . Carotid artery occlusion   . Chest pain 06/19/2018  . Chronic lower back pain    DDD  . CKD (chronic kidney disease)    Dr. Corliss Parish  . Diverticulosis   . Enlarged prostate    takes Flomax daily  . History of blood transfusion    "probably when they did aortic aneurysm"  . History of colon  polyps    benign  . Hyperlipidemia   . Hypertension    takes Amlodipine and Zebeta daily  . Insomnia    takes Melatonin nightly  . Joint pain   . 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     . Muscle spasm    takes Zanaflex daily as needed  . Peripheral edema    takes Furosemide daily  . Pneumonia    hx of   .  Rheumatic fever 1946  . Rheumatoid arthritis ([place])   . Short-term memory loss    minimal  . Shortness of breath dyspnea    with exertion but lasts very short period of time  . TIA (transient ischemic attack)    x 2;takes Plavix daily  . Urinary frequency    takes Flomax daily  . Urinary urgency    BP (!) 141/59   Pulse (!) 53   Ht 5' 7.5" (1.715 m)   Wt 174 lb (78.9 kg)   SpO2 92%   BMI 26.85 kg/m   Opioid Risk Score:   Fall Risk Score:  `1  Depression screen PHQ 2/9  Depression screen [facility] 2/9 04/16/2018 01/22/2018 10/19/2017 10/05/2017 09/25/2017 07/03/2017 09/08/2016  Decreased Interest 0 0 0 0 0 0 0  Down, Depressed, Hopeless 0 0 0 0 0 0 0  PHQ - 2 Score 0 0 0 0 0 0 0  Some recent data might be hidden     Review of Systems  Constitutional: Negative.   HENT: Negative.   Eyes: Negative.   Respiratory: Negative.   Cardiovascular: Negative.   Gastrointestinal: Negative.   Endocrine: Negative.   Genitourinary: Negative.   Musculoskeletal: Positive for arthralgias, gait problem and myalgias.  Skin: Negative.   Allergic/Immunologic: Negative.   Neurological: Positive for tremors.  Hematological: Negative.   Psychiatric/Behavioral: Negative.   All other systems reviewed and are negative.      Objective:   Physical Exam Vitals signs and nursing note reviewed.  Constitutional:      Appearance: Normal appearance.  Neurological:     Mental Status: He is alert and oriented to person, place, and time.     Motor: No weakness.     Gait: Gait abnormal.     Comments: Ambulates with a walker forward flexed posture.  No evidence of toe  drag or knee instability.  Psychiatric:        Mood and Affect: Mood normal.        Behavior: Behavior normal.   Sternotomy incision appears to be intact.  He has no tenderness along the incision no evidence of erythema. Has tremor with dysmetria left finger-nose-finger and left heel-to-shin. Motor strength is 5/5 in the right deltoid bicep tricep grip 4/5 left deltoid bicep triceps grip 4+ left hip flexor knee extensor ankle dorsiflexor 5/5 right hip flexor knee extensor ankle dorsiflexor.  Low back is reduced range of motion he has mild tenderness at the lumbosacral junction.        Assessment & Plan:  1.  Lumbar spinal stenosis with chronic low back pain he does not have any obvious signs of radiculopathy.  Does not appear that his knee pain is related to claudication. He has not been very active because of his recent coronary artery bypass graft.  He is starting back with some physical therapy. He has received oxycodone post hospitalization and still has approximately 18 tablets left. We will give him a prescription for 60 oxycodone 7.5 mg and this will be reviewed again in 1 month.   Nurse practitioner visit in 1 month. Last UDS 04/16/2018 which was appropriate.

## 2018-07-12 NOTE — Patient Instructions (Addendum)
Medication Instructions:  Your physician has recommended you make the following change in your medication:  1.  DECREASE the Metoprolol to 25 mg taking 1 tablet twice a day  .  If you need a refill on your cardiac medications before your next appointment, please call your pharmacy.   Lab work: TODAY:  BMET & CBC  If you have labs (blood work) drawn today and your tests are completely normal, you will receive your results only by: Marland Kitchen MyChart Message (if you have MyChart) OR . A paper copy in the mail If you have any lab test that is abnormal or we need to change your treatment, we will call you to review the results.  Testing/Procedures: None ordered  Follow-Up: Your physician recommends that you schedule a follow-up appointment in: 3 MONTHS WITH DR. ROSS   Any Other Special Instructions Will Be Listed Below (If Applicable).

## 2018-07-13 ENCOUNTER — Telehealth (HOSPITAL_COMMUNITY): Payer: Self-pay

## 2018-07-13 ENCOUNTER — Other Ambulatory Visit: Payer: Medicare HMO | Admitting: *Deleted

## 2018-07-13 DIAGNOSIS — N183 Chronic kidney disease, stage 3 (moderate): Secondary | ICD-10-CM | POA: Diagnosis not present

## 2018-07-13 DIAGNOSIS — I739 Peripheral vascular disease, unspecified: Secondary | ICD-10-CM | POA: Diagnosis not present

## 2018-07-13 DIAGNOSIS — Z48812 Encounter for surgical aftercare following surgery on the circulatory system: Secondary | ICD-10-CM | POA: Diagnosis not present

## 2018-07-13 DIAGNOSIS — I4891 Unspecified atrial fibrillation: Secondary | ICD-10-CM | POA: Diagnosis not present

## 2018-07-13 DIAGNOSIS — I2510 Atherosclerotic heart disease of native coronary artery without angina pectoris: Secondary | ICD-10-CM | POA: Diagnosis not present

## 2018-07-13 DIAGNOSIS — M1990 Unspecified osteoarthritis, unspecified site: Secondary | ICD-10-CM | POA: Diagnosis not present

## 2018-07-13 DIAGNOSIS — I130 Hypertensive heart and chronic kidney disease with heart failure and stage 1 through stage 4 chronic kidney disease, or unspecified chronic kidney disease: Secondary | ICD-10-CM | POA: Diagnosis not present

## 2018-07-13 DIAGNOSIS — Z79899 Other long term (current) drug therapy: Secondary | ICD-10-CM

## 2018-07-13 DIAGNOSIS — G894 Chronic pain syndrome: Secondary | ICD-10-CM | POA: Diagnosis not present

## 2018-07-13 DIAGNOSIS — I5030 Unspecified diastolic (congestive) heart failure: Secondary | ICD-10-CM | POA: Diagnosis not present

## 2018-07-13 LAB — BASIC METABOLIC PANEL
BUN/Creatinine Ratio: 14 (ref 10–24)
BUN: 38 mg/dL — ABNORMAL HIGH (ref 8–27)
CO2: 26 mmol/L (ref 20–29)
Calcium: 9.1 mg/dL (ref 8.6–10.2)
Chloride: 101 mmol/L (ref 96–106)
Creatinine, Ser: 2.65 mg/dL — ABNORMAL HIGH (ref 0.76–1.27)
GFR calc Af Amer: 25 mL/min/{1.73_m2} — ABNORMAL LOW (ref >59)
GFR calc non Af Amer: 22 mL/min/{1.73_m2} — ABNORMAL LOW (ref >59)
GLUCOSE: 116 mg/dL — AB (ref 65–99)
Potassium: 4.6 mmol/L (ref 3.5–5.2)
Sodium: 138 mmol/L (ref 134–144)

## 2018-07-13 NOTE — Telephone Encounter (Signed)
Pt insurance is active and benefits verified through [facility]. Co-pay $10.00, DED $0.00/$0.00 met, out of pocket $3,400.00/$3,400.00 met, co-insurance 0%. No pre-authorization required. Passport, 07/13/2018 @ 11:51AM, REF# [id number]  Will contact patient to see if he is interested in the Cardiac Rehab Program. If interested, patient will need to complete follow up appt. Once completed, patient will be contacted for scheduling upon review by the RN Navigator.

## 2018-07-14 DIAGNOSIS — I4891 Unspecified atrial fibrillation: Secondary | ICD-10-CM | POA: Diagnosis not present

## 2018-07-14 DIAGNOSIS — N183 Chronic kidney disease, stage 3 (moderate): Secondary | ICD-10-CM | POA: Diagnosis not present

## 2018-07-14 DIAGNOSIS — I5030 Unspecified diastolic (congestive) heart failure: Secondary | ICD-10-CM | POA: Diagnosis not present

## 2018-07-14 DIAGNOSIS — I2510 Atherosclerotic heart disease of native coronary artery without angina pectoris: Secondary | ICD-10-CM | POA: Diagnosis not present

## 2018-07-14 DIAGNOSIS — Z48812 Encounter for surgical aftercare following surgery on the circulatory system: Secondary | ICD-10-CM | POA: Diagnosis not present

## 2018-07-14 DIAGNOSIS — M1990 Unspecified osteoarthritis, unspecified site: Secondary | ICD-10-CM | POA: Diagnosis not present

## 2018-07-14 DIAGNOSIS — I130 Hypertensive heart and chronic kidney disease with heart failure and stage 1 through stage 4 chronic kidney disease, or unspecified chronic kidney disease: Secondary | ICD-10-CM | POA: Diagnosis not present

## 2018-07-14 DIAGNOSIS — G894 Chronic pain syndrome: Secondary | ICD-10-CM | POA: Diagnosis not present

## 2018-07-14 DIAGNOSIS — I739 Peripheral vascular disease, unspecified: Secondary | ICD-10-CM | POA: Diagnosis not present

## 2018-07-16 ENCOUNTER — Telehealth: Payer: Self-pay | Admitting: Family Medicine

## 2018-07-16 ENCOUNTER — Ambulatory Visit (INDEPENDENT_AMBULATORY_CARE_PROVIDER_SITE_OTHER): Payer: Medicare HMO | Admitting: Internal Medicine

## 2018-07-16 DIAGNOSIS — I5030 Unspecified diastolic (congestive) heart failure: Secondary | ICD-10-CM | POA: Diagnosis not present

## 2018-07-16 DIAGNOSIS — Z951 Presence of aortocoronary bypass graft: Secondary | ICD-10-CM

## 2018-07-16 DIAGNOSIS — N183 Chronic kidney disease, stage 3 (moderate): Secondary | ICD-10-CM | POA: Diagnosis not present

## 2018-07-16 DIAGNOSIS — I739 Peripheral vascular disease, unspecified: Secondary | ICD-10-CM | POA: Diagnosis not present

## 2018-07-16 DIAGNOSIS — I2510 Atherosclerotic heart disease of native coronary artery without angina pectoris: Secondary | ICD-10-CM | POA: Diagnosis not present

## 2018-07-16 DIAGNOSIS — Z5181 Encounter for therapeutic drug level monitoring: Secondary | ICD-10-CM

## 2018-07-16 DIAGNOSIS — Z48812 Encounter for surgical aftercare following surgery on the circulatory system: Secondary | ICD-10-CM | POA: Diagnosis not present

## 2018-07-16 DIAGNOSIS — I4891 Unspecified atrial fibrillation: Secondary | ICD-10-CM

## 2018-07-16 DIAGNOSIS — I130 Hypertensive heart and chronic kidney disease with heart failure and stage 1 through stage 4 chronic kidney disease, or unspecified chronic kidney disease: Secondary | ICD-10-CM | POA: Diagnosis not present

## 2018-07-16 DIAGNOSIS — M1990 Unspecified osteoarthritis, unspecified site: Secondary | ICD-10-CM | POA: Diagnosis not present

## 2018-07-16 DIAGNOSIS — G894 Chronic pain syndrome: Secondary | ICD-10-CM | POA: Diagnosis not present

## 2018-07-16 LAB — POCT INR: INR: 2.3 (ref 2.0–3.0)

## 2018-07-16 NOTE — Telephone Encounter (Signed)
Okay for UA Please advise

## 2018-07-16 NOTE — Telephone Encounter (Signed)
See note

## 2018-07-16 NOTE — Telephone Encounter (Signed)
Kevin [name] [facility]ed [facility] order to [facility].

## 2018-07-16 NOTE — Telephone Encounter (Signed)
Copied from [place] [id number]. Topic: General - Other >> Jul 16, 2018 12:45 PM Adelene Idler wrote: Eustaquio Maize from [facility] is calling in stating the patient has some dark cloudy Urine and wants to know if a UA can be done. No Blood in Urine just darker than usual  CB# [phone]

## 2018-07-16 NOTE — Telephone Encounter (Signed)
Would want to do UA based off urinary symptoms- not just color of urine. Does he have any symptoms?

## 2018-07-16 NOTE — Patient Instructions (Signed)
Description   Spoke w/ Beth, RN w/ [facility] [phone]) Advised her to have pt continue taking 1/2 tablet daily.  Recheck INR in 2 weeks by home health. [facility] [phone] Main [phone]

## 2018-07-17 DIAGNOSIS — I5030 Unspecified diastolic (congestive) heart failure: Secondary | ICD-10-CM | POA: Diagnosis not present

## 2018-07-17 DIAGNOSIS — I130 Hypertensive heart and chronic kidney disease with heart failure and stage 1 through stage 4 chronic kidney disease, or unspecified chronic kidney disease: Secondary | ICD-10-CM | POA: Diagnosis not present

## 2018-07-17 DIAGNOSIS — I739 Peripheral vascular disease, unspecified: Secondary | ICD-10-CM | POA: Diagnosis not present

## 2018-07-17 DIAGNOSIS — G894 Chronic pain syndrome: Secondary | ICD-10-CM | POA: Diagnosis not present

## 2018-07-17 DIAGNOSIS — Z48812 Encounter for surgical aftercare following surgery on the circulatory system: Secondary | ICD-10-CM | POA: Diagnosis not present

## 2018-07-17 DIAGNOSIS — I4891 Unspecified atrial fibrillation: Secondary | ICD-10-CM | POA: Diagnosis not present

## 2018-07-17 DIAGNOSIS — N183 Chronic kidney disease, stage 3 (moderate): Secondary | ICD-10-CM | POA: Diagnosis not present

## 2018-07-17 DIAGNOSIS — I2510 Atherosclerotic heart disease of native coronary artery without angina pectoris: Secondary | ICD-10-CM | POA: Diagnosis not present

## 2018-07-17 DIAGNOSIS — M1990 Unspecified osteoarthritis, unspecified site: Secondary | ICD-10-CM | POA: Diagnosis not present

## 2018-07-17 NOTE — Telephone Encounter (Signed)
Left message to return phone call.

## 2018-07-19 DIAGNOSIS — N183 Chronic kidney disease, stage 3 (moderate): Secondary | ICD-10-CM | POA: Diagnosis not present

## 2018-07-19 DIAGNOSIS — I5030 Unspecified diastolic (congestive) heart failure: Secondary | ICD-10-CM | POA: Diagnosis not present

## 2018-07-19 DIAGNOSIS — Z48812 Encounter for surgical aftercare following surgery on the circulatory system: Secondary | ICD-10-CM | POA: Diagnosis not present

## 2018-07-19 DIAGNOSIS — I739 Peripheral vascular disease, unspecified: Secondary | ICD-10-CM | POA: Diagnosis not present

## 2018-07-19 DIAGNOSIS — I130 Hypertensive heart and chronic kidney disease with heart failure and stage 1 through stage 4 chronic kidney disease, or unspecified chronic kidney disease: Secondary | ICD-10-CM | POA: Diagnosis not present

## 2018-07-19 DIAGNOSIS — G894 Chronic pain syndrome: Secondary | ICD-10-CM | POA: Diagnosis not present

## 2018-07-19 DIAGNOSIS — M1990 Unspecified osteoarthritis, unspecified site: Secondary | ICD-10-CM | POA: Diagnosis not present

## 2018-07-19 DIAGNOSIS — I4891 Unspecified atrial fibrillation: Secondary | ICD-10-CM | POA: Diagnosis not present

## 2018-07-19 DIAGNOSIS — I2510 Atherosclerotic heart disease of native coronary artery without angina pectoris: Secondary | ICD-10-CM | POA: Diagnosis not present

## 2018-07-24 DIAGNOSIS — G894 Chronic pain syndrome: Secondary | ICD-10-CM | POA: Diagnosis not present

## 2018-07-24 DIAGNOSIS — M1990 Unspecified osteoarthritis, unspecified site: Secondary | ICD-10-CM | POA: Diagnosis not present

## 2018-07-24 DIAGNOSIS — I5030 Unspecified diastolic (congestive) heart failure: Secondary | ICD-10-CM | POA: Diagnosis not present

## 2018-07-24 DIAGNOSIS — I4891 Unspecified atrial fibrillation: Secondary | ICD-10-CM | POA: Diagnosis not present

## 2018-07-24 DIAGNOSIS — Z48812 Encounter for surgical aftercare following surgery on the circulatory system: Secondary | ICD-10-CM | POA: Diagnosis not present

## 2018-07-24 DIAGNOSIS — I130 Hypertensive heart and chronic kidney disease with heart failure and stage 1 through stage 4 chronic kidney disease, or unspecified chronic kidney disease: Secondary | ICD-10-CM | POA: Diagnosis not present

## 2018-07-24 DIAGNOSIS — I739 Peripheral vascular disease, unspecified: Secondary | ICD-10-CM | POA: Diagnosis not present

## 2018-07-24 DIAGNOSIS — I2510 Atherosclerotic heart disease of native coronary artery without angina pectoris: Secondary | ICD-10-CM | POA: Diagnosis not present

## 2018-07-24 DIAGNOSIS — N183 Chronic kidney disease, stage 3 (moderate): Secondary | ICD-10-CM | POA: Diagnosis not present

## 2018-07-27 NOTE — Telephone Encounter (Signed)
Called and spoke with pt in regards to CR, pt stated he is currently recv'ing HHTP. He thinks he may have 2 weeks left, but will check and give [place] a call back.  If pt does not follow up next week, will follow up with the pt in 2 weeks. Placed pt paperwork in HHPT/SNF bin.

## 2018-07-30 ENCOUNTER — Other Ambulatory Visit: Payer: Self-pay | Admitting: Thoracic Surgery (Cardiothoracic Vascular Surgery)

## 2018-07-30 DIAGNOSIS — Z951 Presence of aortocoronary bypass graft: Secondary | ICD-10-CM

## 2018-07-31 ENCOUNTER — Ambulatory Visit (INDEPENDENT_AMBULATORY_CARE_PROVIDER_SITE_OTHER): Payer: Self-pay | Admitting: Thoracic Surgery (Cardiothoracic Vascular Surgery)

## 2018-07-31 ENCOUNTER — Ambulatory Visit
Admission: RE | Admit: 2018-07-31 | Discharge: 2018-07-31 | Disposition: A | Discharge status: Home / Self Care | Payer: Medicare HMO | Source: Ambulatory Visit | Attending: Thoracic Surgery (Cardiothoracic Vascular Surgery) | Admitting: Thoracic Surgery (Cardiothoracic Vascular Surgery)

## 2018-07-31 ENCOUNTER — Other Ambulatory Visit: Payer: Self-pay | Admitting: *Deleted

## 2018-07-31 ENCOUNTER — Other Ambulatory Visit: Payer: Self-pay | Admitting: Thoracic Surgery (Cardiothoracic Vascular Surgery)

## 2018-07-31 VITALS — BP 116/67 | HR 65 | Resp 20 | Ht 67.0 in | Wt 171.0 lb

## 2018-07-31 DIAGNOSIS — Z951 Presence of aortocoronary bypass graft: Secondary | ICD-10-CM

## 2018-07-31 DIAGNOSIS — I2510 Atherosclerotic heart disease of native coronary artery without angina pectoris: Secondary | ICD-10-CM

## 2018-07-31 DIAGNOSIS — J90 Pleural effusion, not elsewhere classified: Secondary | ICD-10-CM | POA: Diagnosis not present

## 2018-07-31 MED ORDER — PREDNISONE 10 MG (21) PO TBPK: ORAL_TABLET | ORAL | 0 refills | Status: DC

## 2018-07-31 NOTE — Progress Notes (Unsigned)
hest  

## 2018-07-31 NOTE — Progress Notes (Signed)
[address]       [place] [address]             [phone]     HPI: Kevin [name] returns for a scheduled follow-up visit   Kevin [name] is a 78 year old gentleman with a past medical history significant for hypertension, hyperlipidemia, peripheral arterial disease, carotid endarterectomy, TIA, ruptured abdominal aortic aneurysm, stage IV chronic kidney disease, and rheumatoid arthritis.  He presented with substernal epigastric pain.  He ruled out for MI, but at catheterization he had severe three-vessel coronary disease.  He underwent coronary artery bypass grafting x4 on 06/27/2018.  He had some atrial fibrillation postoperatively.  He was started on amiodarone.  He converted back to sinus rhythm.  He was sent home on amiodarone and Coumadin.  While in the hospital he had some prolongation of his QTc.  He was seen by cardiology who felt that was not a significant issue.  He complains of pain in the left axillary and anterior chest wall region.  He says that tends to be worse when he leans on his left arm or when he uses his left arm.  Is not the same as his anginal pain.  He has not had any recurrent angina.  Past Medical History:  Diagnosis Date  . Arthritis    DDD- Lower Back  . Bradycardia   . CAP (community acquired pneumonia) 03/17/2015   "THAT'S WHAT THEY ARE THINKING; THEY ARE NOT SURE"  . Carotid artery occlusion   . Chest pain 06/19/2018  . Chronic lower back pain    DDD  . CKD (chronic kidney disease)    Dr. Corliss Parish  . Diverticulosis   . Enlarged prostate    takes Flomax daily  . History of blood transfusion    "probably when they did aortic aneurysm"  . History of colon polyps    benign  . Hyperlipidemia   . Hypertension    takes Amlodipine and Zebeta daily  . Insomnia    takes Melatonin nightly  . Joint pain   . 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     . Muscle spasm    takes Zanaflex daily as needed  .  Peripheral edema    takes Furosemide daily  . Pneumonia    hx of   . Rheumatic fever 1946  . Rheumatoid arthritis ([place])   . Short-term memory loss    minimal  . Shortness of breath dyspnea    with exertion but lasts very short period of time  . TIA (transient ischemic attack)    x 2;takes Plavix daily  . Urinary frequency    takes Flomax daily  . Urinary urgency     Current Outpatient Medications  Medication Sig Dispense Refill  . albuterol (VENTOLIN HFA) 108 (90 Base) MCG/ACT inhaler Inhale 2 puffs into the lungs every 6 (six) hours as needed for wheezing or shortness of breath.    Marland Kitchen amiodarone (PACERONE) 200 MG tablet Take 1 tablet (200 mg total) by mouth 2 (two) times daily. For 10 days, then decrease to 200 mg daily 60 tablet 1  . aspirin EC 81 MG EC tablet Take 1 tablet (81 mg total) by mouth daily.    Marland Kitchen atorvastatin (LIPITOR) 20 MG tablet TAKE 1 TABLET EVERY DAY 90 tablet 1  . BIOTIN PO Take 1 tablet by mouth daily.    . Cholecalciferol (VITAMIN D-3) 1000 UNITS CAPS Take 1,000 Units by mouth  daily.     . diazepam (VALIUM) 5 MG tablet TAKE 1/2 TO 1 TABLET EVERY 8 (EIGHT) HOURS AS NEEDED FOR MUSCLE SPASMS OR SEDATION. 90 tablet 0  . fenofibrate 160 MG tablet TAKE 1 TABLET EVERY DAY 90 tablet 3  . furosemide (LASIX) 20 MG tablet TAKE 1 TABLET EVERY OTHER DAY 45 tablet 1  . gabapentin (NEURONTIN) 300 MG capsule TAKE 1 CAPSULE TWICE DAILY 180 capsule 1  . Melatonin 10 MG TABS Take 10 mg by mouth at bedtime.     . metoprolol tartrate (LOPRESSOR) 25 MG tablet Take 1 tablet (25 mg total) by mouth 2 (two) times daily. 180 tablet 3  . oxyCODONE-acetaminophen (PERCOCET) 7.5-325 MG tablet Take 1 tablet by mouth every 8 (eight) hours as needed for severe pain. 60 tablet 0  . tamsulosin (FLOMAX) 0.4 MG CAPS capsule TAKE 1 CAPSULE EVERY DAY 90 capsule 3  . tiZANidine (ZANAFLEX) 4 MG tablet TAKE 1 TABLET EVERY 6 HOURS AS NEEDED FOR MUSCLE SPASM(S) 90 tablet 1  . triamcinolone cream (KENALOG)  0.1 % APPLY 1 APPLICATION 2 TIMES DAILY TOPICALLY FOR 7-10 DAYS 80 g 0  . vitamin B-12 (CYANOCOBALAMIN) 1000 MCG tablet Take 1,000 mcg by mouth daily.    Marland Kitchen warfarin (COUMADIN) 5 MG tablet Take 1 tablet (5 mg total) by mouth daily at 6 PM. 30 tablet 3  . predniSONE (STERAPRED UNI-PAK 21 TAB) 10 MG (21) TBPK tablet Take 5 tablets PO day 1, 4 on day 2, 3 on day 3, 2 on day 4 and 1 on day 5 15 tablet 0   No current facility-administered medications for this visit.     Physical Exam BP 116/67   Pulse 65   Resp 20   Ht 5\' 7"  (1.702 m)   Wt 171 lb (77.6 kg)   SpO2 96% Comment: RA  BMI 26.40 kg/m  78 year old man in no acute distress Alert and oriented x3 with no focal deficits Lungs diminished at left base, otherwise clear Cardiac regular rate and rhythm normal S1 and S2 Sternum stable, incision well-healed Tenderness to palpation left anterior chest Leg incisions well-healed, no peripheral edema  Diagnostic Tests: CHEST - 2 VIEW  COMPARISON:  07/02/2018 07/01/2018  FINDINGS: Sternotomy wires overlie obscured cardiac silhouette. There is a large LEFT effusion which obscures the LEFT heart. No pulmonary edema. No pneumothorax.  IMPRESSION: Large LEFT effusion.  Similar to comparison exams.   Electronically Signed   By: Suzy Bouchard M.D.   On: 07/31/2018 10:43 I personally reviewed the chest x-ray images and concur with the findings noted above  Impression: Kevin [name] is a 78 year old gentleman gentleman with severe atherosclerotic cardiovascular disease including ruptured abdominal aortic aneurysm, peripheral arterial disease, and carotid endarterectomy.  He also has a history of hypertension, hyperlipidemia, stage IV chronic kidney disease, and rheumatoid arthritis.  He presented with unstable chest pain.  He was found to have severe three-vessel coronary disease.  He underwent coronary artery bypass grafting x4 on 06/27/2018.  He had issues with atrial fibrillation postoperatively  and ultimately went home on amiodarone and Coumadin.  He has not seen cardiology since discharge.  From a pain standpoint he is doing well.  He does have some left anterior chest wall pain and discomfort when his shirt rubs on his chest.  That is typical in the postoperative period.  He does have a moderate left pleural effusion.  I think he would benefit from a thoracentesis.  I recommended that we do that here in  the office today.  We did discuss the risks of bleeding and pneumothorax.  He may begin driving.  Appropriate precautions were discussed.  He should not lift anything over 10 pounds for at least another 2 weeks.  Procedure: Left thoracentesis attempted.  On first attempt no fluid with passage of rib.  Second attempt, 1 interspace higher, serosanguineous fluid encountered but unable to drain with catheter.  Given that he is on Coumadin I do not think additional attempts are warranted.  We will plan to treat him with a prednisone taper and change his Lasix to once daily.  I will see him back in 2 weeks with a chest x-ray.  Plan: Prednisone taper Change Lasix from every other day to daily PA and lateral chest x-ray today to rule out pneumothorax Return in 2 weeks with PA and lateral chest x-ray to follow-up effusion  Melrose Nakayama, MD [facility] [phone]

## 2018-08-01 DIAGNOSIS — I2510 Atherosclerotic heart disease of native coronary artery without angina pectoris: Secondary | ICD-10-CM | POA: Diagnosis not present

## 2018-08-01 DIAGNOSIS — I4891 Unspecified atrial fibrillation: Secondary | ICD-10-CM | POA: Diagnosis not present

## 2018-08-01 DIAGNOSIS — G894 Chronic pain syndrome: Secondary | ICD-10-CM | POA: Diagnosis not present

## 2018-08-01 DIAGNOSIS — I5030 Unspecified diastolic (congestive) heart failure: Secondary | ICD-10-CM | POA: Diagnosis not present

## 2018-08-01 DIAGNOSIS — Z48812 Encounter for surgical aftercare following surgery on the circulatory system: Secondary | ICD-10-CM | POA: Diagnosis not present

## 2018-08-01 DIAGNOSIS — M1990 Unspecified osteoarthritis, unspecified site: Secondary | ICD-10-CM | POA: Diagnosis not present

## 2018-08-01 DIAGNOSIS — I739 Peripheral vascular disease, unspecified: Secondary | ICD-10-CM | POA: Diagnosis not present

## 2018-08-01 DIAGNOSIS — N183 Chronic kidney disease, stage 3 (moderate): Secondary | ICD-10-CM | POA: Diagnosis not present

## 2018-08-01 DIAGNOSIS — I130 Hypertensive heart and chronic kidney disease with heart failure and stage 1 through stage 4 chronic kidney disease, or unspecified chronic kidney disease: Secondary | ICD-10-CM | POA: Diagnosis not present

## 2018-08-02 ENCOUNTER — Other Ambulatory Visit: Payer: Self-pay | Admitting: Family Medicine

## 2018-08-02 MED ORDER — ATORVASTATIN CALCIUM 20 MG PO TABS: 20.0000 mg | ORAL_TABLET | Freq: Every day | ORAL | 1 refills | Status: DC

## 2018-08-02 MED ORDER — GABAPENTIN 300 MG PO CAPS: 300.0000 mg | ORAL_CAPSULE | Freq: Two times a day (BID) | ORAL | 1 refills | Status: DC

## 2018-08-02 NOTE — Telephone Encounter (Signed)
Reviewed medications.  Noted that Amlodipine and Clopidogrel were d/c'd at discharge on 07/04/18.  Call placed to pt.  Explained that the above medications were discontinued.  Advised will process the refill requests for the other medications he requested today.  Verb. Understanding.

## 2018-08-02 NOTE — Telephone Encounter (Signed)
Copied from [place] [id number]. Topic: Quick Communication - Rx Refill/Question >> Aug 02, 2018  2:04 PM [place], [place] D wrote: Medication: diazepam (VALIUM) 5 MG tablet / amLODipine (NORVASC) 10 MG tablet / furosemide (LASIX) 20 MG tablet / clopidogrel (PLAVIX) 75 MG tablet / atorvastatin (LIPITOR) 20 MG tablet / gabapentin (NEURONTIN) 300 MG capsule. Pt stated all of his medications are 90 day supply except for diazepam. Please advise  Has the patient contacted their pharmacy? Yes.   (Agent: If no, request that the patient contact the pharmacy for the refill.) (Agent: If yes, when and what did the pharmacy advise?)  Preferred Pharmacy (with phone number or street name): [facility], [place] [phone] (Phone) [phone] (Fax)  Agent: Please be advised that RX refills may take up to 3 business days. We ask that you follow-up with your pharmacy.

## 2018-08-02 NOTE — Telephone Encounter (Signed)
Requested medication (s) are due for refill today:  Yes   Requested medication (s) are on the active medication list:  yes  Future visit scheduled:  yes  Last Refill: Diazepam; 04/17/18; # 90; no refills                   Furosemide; 03/27/18; #45; RF x 1  Last CR 2.65 on 07/13/18 Called pt. To schedule f/u appt; Hosp. F/u sched. 08/09/18 with Dr. [name]  Requested Prescriptions  Pending Prescriptions Disp Refills   diazepam (VALIUM) 5 MG tablet 90 tablet 0    Sig: TAKE 1/2 TO 1 TABLET EVERY 8 (EIGHT) HOURS AS NEEDED FOR MUSCLE SPASMS OR SEDATION.     Not Delegated - Psychiatry:  Anxiolytics/Hypnotics Failed - 08/02/2018  2:33 PM      Failed - This refill cannot be delegated      Failed - Urine Drug Screen completed in last 360 days.      Passed - Valid encounter within last 6 months    Recent Outpatient Visits          2 months 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   [facility] [place], Legrand Como D, DO   4 months ago Chronic pain of both knees   [facility] [place], Legrand Como D, DO   6 months ago Left knee pain, unspecified chronicity   [facility] [place], Legrand Como D, DO   7 months ago Chronic pain syndrome   [facility] [place] Regulus, Brayton Mars, MD   10 months ago Viral sinusitis   [place], Brayton Mars, MD      Future Appointments            In 1 week Marin Olp, MD [place], [place]   In 2 months  [place], [facility]          furosemide (LASIX) 20 MG tablet 45 tablet 1    Sig: Take 1 tablet (20 mg total) by mouth every other day.     Cardiovascular:  Diuretics - Loop Failed - 08/02/2018  2:33 PM      Failed - Cr in normal range and within 360 days    Creatinine, Ser  Date Value Ref Range Status  07/13/2018 2.65 (H) 0.76 - 1.27 mg/dL Final         Passed - K in normal range and within 360 days   Potassium  Date Value Ref Range Status  07/13/2018 4.6 3.5 - 5.2 mmol/L Final         Passed - Ca in normal range and within 360 days    Calcium  Date Value Ref Range Status  07/13/2018 9.1 8.6 - 10.2 mg/dL Final         Passed - Na in normal range and within 360 days    Sodium  Date Value Ref Range Status  07/13/2018 138 134 - 144 mmol/L Final         Passed - Last BP in normal range    BP Readings from Last 1 Encounters:  07/31/18 116/67         Passed - Valid encounter within last 6 months    Recent Outpatient Visits          2 months 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   [facility] [place], Legrand Como D, DO   4 months ago Chronic pain of both knees   [facility] [place], [place] D, DO   6 months ago Left knee pain, unspecified chronicity   [facility] [place], Legrand Como D, DO   7 months ago Chronic pain syndrome   [place] [facility], Brayton Mars, MD   10 months ago Viral sinusitis   [place] Hunter, Brayton Mars, MD      Future Appointments            In 1 week Marin Olp, MD [place], [place]   In 2 months  [place], [place]         Signed Prescriptions Disp Refills   atorvastatin (LIPITOR) 20 MG tablet 90 tablet 1    Sig: Take 1 tablet (20 mg total) by mouth daily.     Cardiovascular:  Antilipid - Statins Failed - 08/02/2018  2:33 PM      Failed - HDL in normal range and within 360 days    HDL  Date Value Ref Range Status  06/20/2018 23 (L) >40 mg/dL Final         Failed - Triglycerides in normal range and within 360 days    Triglycerides  Date Value Ref Range Status  06/20/2018 177 (H) <150 mg/dL Final         Passed - Total Cholesterol in normal range and within 360 days    Cholesterol  Date Value Ref Range Status  06/20/2018 113 0 - 200 mg/dL Final         Passed - LDL in normal  range and within 360 days    LDL Cholesterol  Date Value Ref Range Status  06/20/2018 55 0 - 99 mg/dL Final    Comment:           Total Cholesterol/HDL:CHD Risk Coronary Heart Disease Risk Table                     Men   Women  1/2 Average Risk   3.4   3.3  Average Risk       5.0   4.4  2 X Average Risk   9.6   7.1  3 X Average Risk  23.4   11.0        Use the calculated Patient Ratio above and the CHD Risk Table to determine the patient's CHD Risk.        ATP III CLASSIFICATION (LDL):  <100     mg/dL   Optimal  100-129  mg/dL   Near or Above                    Optimal  130-159  mg/dL   Borderline  160-189  mg/dL   High  >190     mg/dL   Very High Performed at [facility] [address] [place], [place] [address]          Passed - Patient is not pregnant      Passed - Valid encounter within last 12 months    Recent Outpatient Visits          2 months 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   [facility] [place], Legrand Como D, DO   4 months ago Chronic pain of both knees   [facility] [place], Legrand Como D, DO   6 months ago Left knee pain, unspecified chronicity   [facility] [place], Legrand Como D, DO   7  months ago Chronic pain syndrome   [place] [facility], Brayton Mars, MD   10 months ago Viral sinusitis   [place] [facility], Brayton Mars, MD      Future Appointments            In 1 week [name], Brayton Mars, MD [place], [place]   In 2 months  [place], [facility]          gabapentin (NEURONTIN) 300 MG capsule 180 capsule 1    Sig: Take 1 capsule (300 mg total) by mouth 2 (two) times daily.     Neurology: Anticonvulsants - gabapentin Passed - 08/02/2018  2:33 PM      Passed - Valid encounter within last 12 months    Recent Outpatient Visits          2 months 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   [place], [place] D, DO   4 months ago Chronic pain of both knees   [facility] [place], Legrand Como D, DO   6 months ago Left knee pain, unspecified chronicity   [facility] [place], Legrand Como D, DO   7 months ago Chronic pain syndrome   [facility] [place] Regulus, Brayton Mars, MD   10 months ago Viral sinusitis   [facility] [place] Regulus, Brayton Mars, MD      Future Appointments            In 1 week [name], Brayton Mars, MD [facility] [place], [place]   In 2 months  [place], [facility]          Requested Prescriptions  Pending Prescriptions Disp Refills   diazepam (VALIUM) 5 MG tablet 90 tablet 0    Sig: TAKE 1/2 TO 1 TABLET EVERY 8 (EIGHT) HOURS AS NEEDED FOR MUSCLE SPASMS OR SEDATION.     Not Delegated - Psychiatry:  Anxiolytics/Hypnotics Failed - 08/02/2018  2:33 PM      Failed - This refill cannot be delegated      Failed - Urine Drug Screen completed in last 360 days.      Passed - Valid encounter within last 6 months    Recent Outpatient Visits          2 months 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   [facility] [place], Legrand Como D, DO   4 months ago Chronic pain of both knees   [facility] [place], Legrand Como D, DO   6 months ago Left knee pain, unspecified chronicity   [facility] [place], Legrand Como D, DO   7 months ago Chronic pain syndrome   [facility] [place] Regulus, Brayton Mars, MD   10 months ago Viral sinusitis   [place] [facility], Brayton Mars, MD      Future Appointments            In 1 week Marin Olp, MD [place], [place]   In 2 months  [place], [facility]          furosemide (LASIX) 20 MG tablet 45 tablet 1    Sig: Take 1 tablet (20 mg total) by mouth every other day.      Cardiovascular:  Diuretics - Loop Failed - 08/02/2018  2:33 PM      Failed - Cr in normal range and within 360 days    Creatinine, Ser  Date Value Ref Range Status  07/13/2018 2.65 (H) 0.76 - 1.27 mg/dL Final         Passed - K in normal range and within 360 days    Potassium  Date Value Ref Range Status  07/13/2018 4.6 3.5 - 5.2 mmol/L Final         Passed - Ca in normal range and within 360 days    Calcium  Date Value Ref Range Status  07/13/2018 9.1 8.6 - 10.2 mg/dL Final         Passed - Na in normal range and within 360 days    Sodium  Date Value Ref Range Status  07/13/2018 138 134 - 144 mmol/L Final         Passed - Last BP in normal range    BP Readings from Last 1 Encounters:  07/31/18 116/67         Passed - Valid encounter within last 6 months    Recent Outpatient Visits          2 months 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   [facility] [place], [place] D, DO   4 months ago Chronic pain of both knees   [facility] [place], [place] D, DO   6 months ago Left knee pain, unspecified chronicity   [facility] [place], [place] D, DO   7 months ago Chronic pain syndrome   [facility] [place] Regulus, Brayton Mars, MD   10 months ago Viral sinusitis   [place] [facility], Brayton Mars, MD      Future Appointments            In 1 week Marin Olp, MD [place], [place]   In 2 months  [place], [facility]         Signed Prescriptions Disp Refills   atorvastatin (LIPITOR) 20 MG tablet 90 tablet 1    Sig: Take 1 tablet (20 mg total) by mouth daily.     Cardiovascular:  Antilipid - Statins Failed - 08/02/2018  2:33 PM      Failed - HDL in normal range and within 360 days    HDL  Date Value Ref Range Status  06/20/2018 23 (L) >40 mg/dL Final         Failed - Triglycerides in normal range and within 360 days     Triglycerides  Date Value Ref Range Status  06/20/2018 177 (H) <150 mg/dL Final         Passed - Total Cholesterol in normal range and within 360 days    Cholesterol  Date Value Ref Range Status  06/20/2018 113 0 - 200 mg/dL Final         Passed - LDL in normal range and within 360 days    LDL Cholesterol  Date Value Ref Range Status  06/20/2018 55 0 - 99 mg/dL Final    Comment:           Total Cholesterol/HDL:CHD Risk Coronary Heart Disease Risk Table                     Men   Women  1/2 Average Risk   3.4   3.3  Average Risk       5.0   4.4  2 X Average Risk   9.6   7.1  3 X Average Risk  23.4   11.0  Use the calculated Patient Ratio above and the CHD Risk Table to determine the patient's CHD Risk.        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   [facility] [place], Legrand Como D, DO   4 months ago Chronic pain of both knees   [facility] [place], Legrand Como D, DO   6 months ago Left knee pain, unspecified chronicity   [facility] [place], Legrand Como D, DO   7 months ago Chronic pain syndrome   [place] [facility], Brayton Mars, MD   10 months ago Viral sinusitis   [place] [facility], Brayton Mars, MD      Future Appointments            In 1 week Marin Olp, MD [place], [place]   In 2 months  [place], [facility]          gabapentin (NEURONTIN) 300 MG capsule 180 capsule 1    Sig: Take 1 capsule  (300 mg total) by mouth 2 (two) times daily.     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

## 2018-08-03 ENCOUNTER — Encounter: Payer: Self-pay | Admitting: Family Medicine

## 2018-08-03 ENCOUNTER — Ambulatory Visit (INDEPENDENT_AMBULATORY_CARE_PROVIDER_SITE_OTHER): Payer: Medicare HMO | Admitting: Family Medicine

## 2018-08-03 ENCOUNTER — Ambulatory Visit: Payer: Self-pay

## 2018-08-03 ENCOUNTER — Ambulatory Visit (INDEPENDENT_AMBULATORY_CARE_PROVIDER_SITE_OTHER): Payer: Medicare HMO

## 2018-08-03 VITALS — BP 146/80 | HR 65 | Temp 97.6°F | Wt 172.0 lb

## 2018-08-03 DIAGNOSIS — S60419A Abrasion of unspecified finger, initial encounter: Secondary | ICD-10-CM | POA: Diagnosis not present

## 2018-08-03 DIAGNOSIS — S5001XA Contusion of right elbow, initial encounter: Secondary | ICD-10-CM

## 2018-08-03 DIAGNOSIS — S7001XA Contusion of right hip, initial encounter: Secondary | ICD-10-CM | POA: Diagnosis not present

## 2018-08-03 DIAGNOSIS — I4891 Unspecified atrial fibrillation: Secondary | ICD-10-CM | POA: Diagnosis not present

## 2018-08-03 DIAGNOSIS — Z951 Presence of aortocoronary bypass graft: Secondary | ICD-10-CM

## 2018-08-03 DIAGNOSIS — Z5181 Encounter for therapeutic drug level monitoring: Secondary | ICD-10-CM

## 2018-08-03 LAB — POCT INR: INR: 2 (ref 2.0–3.0)

## 2018-08-03 MED ORDER — FUROSEMIDE 20 MG PO TABS: 20.0000 mg | ORAL_TABLET | ORAL | 1 refills | Status: DC

## 2018-08-03 MED ORDER — DIAZEPAM 5 MG PO TABS: ORAL_TABLET | ORAL | 0 refills | Status: DC

## 2018-08-03 MED ORDER — WARFARIN SODIUM 2.5 MG PO TABS: ORAL_TABLET | ORAL | 1 refills | Status: DC

## 2018-08-03 NOTE — Telephone Encounter (Signed)
Patient called in with his home health nurse on the other line. She says he called the company this morning due to a fall and he had recent open heart surgery. She says she would like him seen in the office today for evaluation and possible chest x-ray to make sure nothing is broken. The patient says this morning about 25 minutes ago, he slipped on the end of a blanket and fell on his right side, more on the elbow, onto a yeti cup. He says his skin wasn't cut and no bruising to the right rib area. He says he has pain at a 6, but more in his back, not so much on the side. He says he just wants to make sure with the fall that he didn't hurt chest incision. He denies SOB, chest pain at this time. According to protocol, see PCP within 24 hours, no appointments available with PCP or any provider in the practice, advised by [facility] [place] to offer the patient an appointment at another [facility] practice. I advised the patient and he asked for [place] with [employer]. I advised that Tommi Rumps has restrictions on patients a certain age and will need a 30 minute, so he has nothing available at the time frame for the patient. He says he will see another provider at [facility]. Appointment scheduled for today at 1315 with Dr. Sarajane Jews, care advice given, patient verbalized understanding.  Reason for Disposition . [1] High-risk adult (e.g., age > 31, osteoporosis, chronic steroid use) AND [2] still hurts  Answer Assessment - Initial Assessment Questions 1. MECHANISM: "How did the injury happen?"     Fell today on yeti cup, slipped on the blanklet 2. ONSET: "When did the injury happen?" (Minutes or hours ago)     25 minutes ago 3. LOCATION: "Where on the chest is the injury located?"     Fell on right side, landed on elbow, cup was under me 4. APPEARANCE: "What does the injury look like?"     Nothing it's just tender 5. BLEEDING: "Is there any bleeding now? If so, ask: How long has it been bleeding?"     No 6.  SEVERITY: "Any difficulty with breathing?"     No 7. SIZE: For cuts, bruises, or swelling, ask: "How large is it?" (e.g., inches or centimeters)     N/A 8. PAIN: "Is there pain?" If so, ask: "How bad is the pain?"   (e.g., Scale 1-10; or mild, moderate, severe)     6 9. TETANUS: For any breaks in the skin, ask: "When was the last tetanus booster?"     N/A 10. PREGNANCY: "Is there any chance you are pregnant?" "When was your last menstrual period?"       N/A  Protocols used: CHEST INJURY-A-AH

## 2018-08-03 NOTE — Telephone Encounter (Signed)
Pt requesting refill of Valium,  Last filled 04/18/18 #90.  Pt has appointment 08/09/18.

## 2018-08-03 NOTE — Progress Notes (Signed)
   Subjective:    Patient ID: Kevin [name], male    DOB: 01-25-1941, 78 y.o.   MRN: [id number]  HPI Here with his daughter to check on injuries after a fall at home this morning. While walking in his kitche he stepped on a Yeti bottle on the floor and fell. He landed on his right hip, right ribs, and right elbow. He also scraped his left 5th finger. He did not strike his head and he had no LOC. As we speak he is a little sore on the right side but he can walk and bear weight without pain. His chief concern is about his heart and his sternum. One month ago he had a 4 vessel CABG and the usual sternotomy. He is concerned that he may have knocked some of the wires loose. He says he has some slight soreness across the chest today. No SOB. He denies any headache or blurred vision or nausea or dizziness.    Review of Systems  Constitutional: Negative.   Eyes: Negative.   Respiratory: Negative.   Cardiovascular: Negative.   Musculoskeletal: Positive for arthralgias.  Neurological: Negative.        Objective:   Physical Exam Constitutional:      General: He is not in acute distress.    Comments: Walks with a cane   HENT:     Head: Normocephalic and atraumatic.  Eyes:     Extraocular Movements: Extraocular movements intact.     Conjunctiva/sclera: Conjunctivae normal.     Pupils: Pupils are equal, round, and reactive to light.  Cardiovascular:     Rate and Rhythm: Normal rate and regular rhythm.     Pulses: Normal pulses.     Heart sounds: Normal heart sounds.  Pulmonary:     Effort: Pulmonary effort is normal. No respiratory distress.     Breath sounds: Normal breath sounds. No stridor. No wheezing, rhonchi or rales.     Comments: The sternotomy scar is intact ,it is not tender. No crepitus  Musculoskeletal:     Comments: His right elbow and hip are intact, no tenderness, full ROM  Skin:    Comments: There is a small skin abrasion on the left 5th finger   Neurological:     Mental  Status: He is alert.           Assessment & Plan:  He seems to be okay after a fall today. He has minor bruising to the right elbow and hip. No head trauma apparently. His surgical site is intact and I reassured him that everything inside his chest was fine. We dressed the small abrasion on the finger with Neosporin and a Bandaid.  Alysia Penna, MD

## 2018-08-03 NOTE — Patient Instructions (Addendum)
Description   Continue on same dosage 1 tablet daily. Recheck INR in 3 weeks. Coumadin Clinic [phone] Main [phone]

## 2018-08-03 NOTE — Telephone Encounter (Signed)
See note

## 2018-08-09 ENCOUNTER — Ambulatory Visit (INDEPENDENT_AMBULATORY_CARE_PROVIDER_SITE_OTHER): Payer: Medicare HMO | Admitting: Family Medicine

## 2018-08-09 ENCOUNTER — Encounter: Payer: Self-pay | Admitting: Family Medicine

## 2018-08-09 ENCOUNTER — Other Ambulatory Visit: Payer: Self-pay

## 2018-08-09 VITALS — BP 138/72 | HR 61 | Temp 98.4°F | Ht 67.0 in | Wt 175.2 lb

## 2018-08-09 DIAGNOSIS — I10 Essential (primary) hypertension: Secondary | ICD-10-CM | POA: Diagnosis not present

## 2018-08-09 DIAGNOSIS — N183 Chronic kidney disease, stage 3 unspecified: Secondary | ICD-10-CM

## 2018-08-09 DIAGNOSIS — E782 Mixed hyperlipidemia: Secondary | ICD-10-CM

## 2018-08-09 DIAGNOSIS — I4891 Unspecified atrial fibrillation: Secondary | ICD-10-CM | POA: Diagnosis not present

## 2018-08-09 DIAGNOSIS — I2510 Atherosclerotic heart disease of native coronary artery without angina pectoris: Secondary | ICD-10-CM

## 2018-08-09 LAB — CBC WITH DIFFERENTIAL/PLATELET
BASOS PCT: 0.6 % (ref 0.0–3.0)
Basophils Absolute: 0.1 10*3/uL (ref 0.0–0.1)
Eosinophils Absolute: 0.7 10*3/uL (ref 0.0–0.7)
Eosinophils Relative: 6.1 % — ABNORMAL HIGH (ref 0.0–5.0)
HCT: 34.7 % — ABNORMAL LOW (ref 39.0–52.0)
Hemoglobin: 11.4 g/dL — ABNORMAL LOW (ref 13.0–17.0)
Lymphocytes Relative: 13.8 % (ref 12.0–46.0)
Lymphs Abs: 1.7 10*3/uL (ref 0.7–4.0)
MCHC: 32.9 g/dL (ref 30.0–36.0)
MCV: 91.1 fl (ref 78.0–100.0)
Monocytes Absolute: 0.9 10*3/uL (ref 0.1–1.0)
Monocytes Relative: 7.2 % (ref 3.0–12.0)
Neutro Abs: 8.7 10*3/uL — ABNORMAL HIGH (ref 1.4–7.7)
Neutrophils Relative %: 72.3 % (ref 43.0–77.0)
Platelets: 284 10*3/uL (ref 150.0–400.0)
RBC: 3.81 Mil/uL — ABNORMAL LOW (ref 4.22–5.81)
RDW: 15 % (ref 11.5–15.5)
WBC: 12.1 10*3/uL — AB (ref 4.0–10.5)

## 2018-08-09 LAB — POC URINALSYSI DIPSTICK (AUTOMATED)
Bilirubin, UA: NEGATIVE
Blood, UA: NEGATIVE
Glucose, UA: NEGATIVE
Ketones, UA: NEGATIVE
Leukocytes, UA: NEGATIVE
Nitrite, UA: NEGATIVE
Protein, UA: NEGATIVE
SPEC GRAV UA: 1.02 (ref 1.010–1.025)
Urobilinogen, UA: 0.2 E.U./dL
pH, UA: 6 (ref 5.0–8.0)

## 2018-08-09 LAB — COMPREHENSIVE METABOLIC PANEL
ALBUMIN: 3.9 g/dL (ref 3.5–5.2)
ALT: 20 U/L (ref 0–53)
AST: 27 U/L (ref 0–37)
Alkaline Phosphatase: 33 U/L — ABNORMAL LOW (ref 39–117)
BUN: 38 mg/dL — ABNORMAL HIGH (ref 6–23)
CO2: 27 mEq/L (ref 19–32)
Calcium: 9 mg/dL (ref 8.4–10.5)
Chloride: 102 mEq/L (ref 96–112)
Creatinine, Ser: 2.5 mg/dL — ABNORMAL HIGH (ref 0.40–1.50)
GFR: 25 mL/min — ABNORMAL LOW (ref >60.00)
Glucose, Bld: 99 mg/dL (ref 70–99)
Potassium: 4.3 mEq/L (ref 3.5–5.1)
Sodium: 137 mEq/L (ref 135–145)
Total Bilirubin: 0.5 mg/dL (ref 0.2–1.2)
Total Protein: 6.5 g/dL (ref 6.0–8.3)

## 2018-08-09 MED ORDER — DIAZEPAM 5 MG PO TABS: ORAL_TABLET | ORAL | 0 refills | Status: DC

## 2018-08-09 NOTE — Patient Instructions (Addendum)
Unable to give AVS as epic was done at time of visit.  Team to reach out about flu test.

## 2018-08-09 NOTE — Progress Notes (Signed)
Phone [phone]   Subjective:  Kevin [name] is a 78 y.o. year old very pleasant male patient who presents for/with See problem oriented charting ROS-pain over his surgical scar on his chest, left lower quadrant pain.  Slightly off balance per baseline.  No worsening shortness of breath.  Past Medical History-  Patient Active Problem List   Diagnosis Date Noted  . Afib ([place]) 07/06/2018    Priority: High  . 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 PAD (peripheral artery disease) ([place]) 01/08/2016    Priority: High  . Diastolic CHF ([place]) 84/69/6295    Priority: High  . CKD (chronic kidney disease), stage III ([place]) 01/27/2014    Priority: High  . AAA (abdominal aortic aneurysm, ruptured) ([place]) 01/17/2013    Priority: High  . Carotid artery stenosis s/p L carotid endarterectomy 01/12/2012    Priority: High  . History of CVA (cerebrovascular accident) 11/17/2011    Priority: High  . Chronic pain syndrome 12/23/2009    Priority: High  . Constipation 09/19/2017    Priority: Medium  . Personal history of colonic polyps 05/15/2013    Priority: Medium  . Essential tremor 10/05/2009    Priority: Medium  . UNSPECIFIED ANEMIA 12/05/2008    Priority: Medium  . BPH (benign prostatic hyperplasia) 06/04/2007    Priority: Medium  . Fasting hyperglycemia 02/05/2007    Priority: Medium  . Hyperlipidemia 01/03/2007    Priority: Medium  . Essential hypertension 01/03/2007    Priority: Medium  . CAP (community acquired pneumonia) 03/17/2015    Priority: Low  . Family history of malignant neoplasm of gastrointestinal tract 05/15/2013    Priority: Low  . Inguinal hernia 05/01/2012    Priority: Low  . BURSITIS, HIP 12/21/2009    Priority: Low  . ACTINIC KERATOSIS, EAR, LEFT 03/09/2009    Priority: Low  . 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 Lumbar spondylosis 03/17/2017    Medications- reviewed and  updated Current Outpatient Medications  Medication Sig Dispense Refill  . albuterol (VENTOLIN HFA) 108 (90 Base) MCG/ACT inhaler Inhale 2 puffs into the lungs every 6 (six) hours as needed for wheezing or shortness of breath.    Marland Kitchen amiodarone (PACERONE) 200 MG tablet Take 1 tablet (200 mg total) by mouth 2 (two) times daily. For 10 days, then decrease to 200 mg daily (Patient taking differently: Take 200 mg by mouth daily. ) 60 tablet 1  . aspirin EC 81 MG EC tablet Take 1 tablet (81 mg total) by mouth daily.    Marland Kitchen atorvastatin (LIPITOR) 20 MG tablet Take 1 tablet (20 mg total) by mouth daily. 90 tablet 1  . BIOTIN PO Take 1 tablet by mouth daily.    . Cholecalciferol (VITAMIN D-3) 1000 UNITS CAPS Take 1,000 Units by mouth daily.     . diazepam (VALIUM) 5 MG tablet TAKE 1/2 TO 1 TABLET q8 hours PRN pain sleep/muscle spasm-only if wife is around+can watch you closely when taking due to sedation risk 90 tablet 0  . fenofibrate 160 MG tablet TAKE 1 TABLET EVERY DAY 90 tablet 3  . furosemide (LASIX) 20 MG tablet Take 1 tablet (20 mg total) by mouth every other day. 45 tablet 1  . gabapentin (NEURONTIN) 300 MG capsule Take 1 capsule (300 mg total) by mouth 2 (two) times daily. 180 capsule 1  . Melatonin 10 MG TABS Take  10 mg by mouth at bedtime.     . metoprolol tartrate (LOPRESSOR) 25 MG tablet Take 1 tablet (25 mg total) by mouth 2 (two) times daily. 180 tablet 3  . oxyCODONE-acetaminophen (PERCOCET) 7.5-325 MG tablet Take 1 tablet by mouth every 8 (eight) hours as needed for severe pain. 60 tablet 0  . tamsulosin (FLOMAX) 0.4 MG CAPS capsule TAKE 1 CAPSULE EVERY DAY 90 capsule 3  . tiZANidine (ZANAFLEX) 4 MG tablet TAKE 1 TABLET EVERY 6 HOURS AS NEEDED FOR MUSCLE SPASM(S) 90 tablet 1  . triamcinolone cream (KENALOG) 0.1 % APPLY 1 APPLICATION 2 TIMES DAILY TOPICALLY FOR 7-10 DAYS 80 g 0  . vitamin B-12 (CYANOCOBALAMIN) 1000 MCG tablet Take 1,000 mcg by mouth daily.    Marland Kitchen warfarin (COUMADIN) 2.5 MG  tablet Take as directed by Coumadin Clinic 100 tablet 1   No current facility-administered medications for this visit.      Objective:  BP 138/72   Pulse 61   Temp 98.4 F (36.9 C) (Oral)   Ht 5\' 7"  (1.702 m)   Wt 175 lb 3.2 oz (79.5 kg)   SpO2 98%   BMI 27.44 kg/m  Gen: NAD, resting comfortably CV: RRR no murmurs rubs or gallops Lungs: CTAB no crackles, wheeze, rhonchi Abdomen: soft/nontender-except with deep palpation in left lower quadrant/nondistended/normal bowel sounds. No rebound or guarding.  Ext: no edema Skin: warm, dry Neuro: grossly normal, moves all extremities    Assessment and Plan    Hospital follow-up for CAD now status post CABG S: Patient presents for hospital follow-up after being hospitalized for CABG June 19, 2018 to July 04, 2018.  I am not exactly sure why his follow-up got pushed back this far.  He has had several follow-ups in the interval-has seen cardiology, PM+R, cardiothoracic surgery.  He saw Dr. Sarajane Jews approximately a week ago after a fall onto his right side where he tripped on the rug.  He has several complaints today 1.  Mild chest pain over his surgical scar for several days-seems to be worse after the fall.  He also feels like it is worse after straining for bowel movement.  He denies any exertional chest pain 2.  Left lower quadrant pain.  He fell on his right side so does not think this is related.  He states he has had to strain a fair amount and has been using MiraLAX and prune juice as well as stool softener twice a day.  He is only using the MiraLAX sparingly perhaps every other day or third day.  He does take oxycodone still has issues with constipation at baseline but likely worse after being more sedentary after surgery. 3.  Slightly more back pain and hip pain after the fall but no significant increase. 4.  He feels slightly off balance but this feels largely to be his baseline.  He is compliant with his aspirin and atorvastatin in  regards to his hyperlipidemia and CAD.  He also takes fenofibrate for cholesterol/triglycerides.  Blood pressure is well controlled today on metoprolol 25 mg twice a day. The  Atrial fibrillation is rate controlled on amiodarone and metoprolol and patient is also on Coumadin through the cardiology clinic A/P: 1.  Suspect some jarring of the surgical site from recent fall and straining- continue to monitor.  Thankfully no exertional chest pain. 2.  Discussed likely constipation related.  Benign abdominal exam today.  Urinalysis reassuring and minimal urinary symptoms.  Recommended scheduling MiraLAX daily until regular bowel movement.  Discussed follow-up if fails to improve 3.  Suspect short-term flareup of pain in the back from fall-seems to be improving 4.  Seem to be about his baseline today-continue to monitor.  #CAD/hyperlipidemia-continue aspirin, atorvastatin, fenofibrate  #Hypertension/CKD stage III- IV- controlled on repeat today on metoprolol CKD Stage III appears possibly to have worsened to stage IV in hospital- will continue to monitor- repeat at follow up.  #Atrial fibrillation- continue amiodarone, metoprolol for rate control.  For anticoagulation-continue Coumadin  Future Appointments  Date Time Provider [place]  08/10/2018  9:20 AM Bayard Hugger, NP [facility]  08/13/2018  4:15 PM Melrose Nakayama, MD [facility]  08/24/2018  3:30 PM [facility]  10/11/2018  2:00 PM [facility] HE[facility] CO[facility]  10/19/2018  2:00 PM Fay Records, MD [facility]   3-44-month follow-up would be reasonable given close cardiology follow-up as well . Lab/Order associations: Hypertension, essential - Plan: Comprehensive metabolic panel, CBC with Differential/Platelet, POCT Urinalysis Dipstick (Automated)  CKD (chronic kidney disease), stage III (HCC)  Mixed hyperlipidemia  Coronary artery disease involving  native coronary artery of native heart without angina pectoris  Atrial fibrillation, unspecified type ([place])  Meds ordered this encounter  Medications  . diazepam (VALIUM) 5 MG tablet    Sig: TAKE 1/2 TO 1 TABLET q8 hours PRN pain sleep/muscle spasm-only if wife is around+can watch you closely when taking due to sedation risk    Dispense:  90 tablet    Refill:  0    Time Stamp The duration of face-to-face time during this visit was greater than 40 minutes (8- 8:40 PM). Greater than 50% of this time was spent in counseling, explanation of diagnosis, planning of further management, and/or coordination of care including discussing patient's current illness and potential treatments such as for left lower side pain and constipation treatment, discussing coronavirus concerns, reviewing patient concerns from prior hospitalization and follow-up concerns such as his fall, discussing need for blood work, discussing risks of diazepam-requests refill and has done well with this recently primarily helping with sleep-discussing potential increased fall risk.    Return precautions advised.  Garret Reddish, MD

## 2018-08-09 NOTE — Assessment & Plan Note (Signed)
Status post CABG January 2020.  On aspirin, statin, fenofibrate, metoprolol

## 2018-08-09 NOTE — Assessment & Plan Note (Signed)
Develop this around the time of bypass-now anticoagulated with Coumadin.  On amiodarone and metoprolol for rate control

## 2018-08-10 ENCOUNTER — Encounter: Payer: Self-pay | Admitting: Registered Nurse

## 2018-08-10 ENCOUNTER — Other Ambulatory Visit: Payer: Self-pay | Admitting: Thoracic Surgery (Cardiothoracic Vascular Surgery)

## 2018-08-10 ENCOUNTER — Telehealth: Payer: Self-pay | Admitting: Family Medicine

## 2018-08-10 ENCOUNTER — Encounter: Payer: Medicare HMO | Attending: Physical Medicine & Rehabilitation | Admitting: Registered Nurse

## 2018-08-10 VITALS — BP 130/73 | HR 61 | Ht 67.5 in | Wt 175.0 lb

## 2018-08-10 DIAGNOSIS — M25562 Pain in left knee: Secondary | ICD-10-CM

## 2018-08-10 DIAGNOSIS — I739 Peripheral vascular disease, unspecified: Secondary | ICD-10-CM | POA: Diagnosis not present

## 2018-08-10 DIAGNOSIS — G8929 Other chronic pain: Secondary | ICD-10-CM | POA: Diagnosis not present

## 2018-08-10 DIAGNOSIS — M25551 Pain in right hip: Secondary | ICD-10-CM | POA: Diagnosis not present

## 2018-08-10 DIAGNOSIS — I2510 Atherosclerotic heart disease of native coronary artery without angina pectoris: Secondary | ICD-10-CM

## 2018-08-10 DIAGNOSIS — M25561 Pain in right knee: Secondary | ICD-10-CM | POA: Diagnosis not present

## 2018-08-10 DIAGNOSIS — Z79899 Other long term (current) drug therapy: Secondary | ICD-10-CM

## 2018-08-10 DIAGNOSIS — G894 Chronic pain syndrome: Secondary | ICD-10-CM | POA: Diagnosis not present

## 2018-08-10 DIAGNOSIS — M1712 Unilateral primary osteoarthritis, left knee: Secondary | ICD-10-CM | POA: Diagnosis not present

## 2018-08-10 DIAGNOSIS — M47816 Spondylosis without myelopathy or radiculopathy, lumbar region: Secondary | ICD-10-CM | POA: Diagnosis not present

## 2018-08-10 DIAGNOSIS — M48061 Spinal stenosis, lumbar region without neurogenic claudication: Secondary | ICD-10-CM | POA: Diagnosis not present

## 2018-08-10 DIAGNOSIS — Z5181 Encounter for therapeutic drug level monitoring: Secondary | ICD-10-CM | POA: Diagnosis not present

## 2018-08-10 MED ORDER — OXYCODONE-ACETAMINOPHEN 7.5-325 MG PO TABS: 1.0000 | ORAL_TABLET | Freq: Three times a day (TID) | ORAL | 0 refills | Status: DC | PRN

## 2018-08-10 NOTE — Telephone Encounter (Signed)
Spoke to wife and she wants to know when he should come back for labs. I notified wife that I would reach out to Dr. [name] for advise but if the pt becomes ill to go to urgent care or ED in the meantime. Wife stated pt can wait until Monday for advise but will take pt to seek treatment if sx persist or worsen.    Please advise

## 2018-08-10 NOTE — Telephone Encounter (Signed)
Pt's wife returned call and lab message given to her with verbal understanding. She wants to know when does he need to have his blood tested again regarding his wt count.  Advised her that also that if he has any appearances of being ill to notify the office: including fever, nausea, vomiting, no energy, no appetite. She voiced understanding He declined the flu vaccine.

## 2018-08-10 NOTE — Progress Notes (Signed)
Subjective:    Patient ID: Kevin [name], male    DOB: [birth year], 78 y.o.   MRN: [id number]  HPI: Kevin [name] is a 78 y.o. male who returns for follow up appointment for chronic pain and medication refill. He states his pain is located in his lower back. He rates his pain 6. His  current exercise regime is walking.  Also reports on 08/03/2018 he [place] and slipped on the blanket , landed on his right side. He picked himself up, he didn't seek medical attention.. Educated on falls prevention, he verbalizes understanding.   Kevin [name] reports he usually takes his Oxycodone twice a day and occasionally three times a day, after the fall he was using his oxycodone three times a day for a few days. We will increase his tablets to #70, he verbalizes understanding.   Kevin [name] Morphine equivalent is 33.75 MME. He is also prescribed Diazepam  by Dr. [name].We have discussed the black box warning of using opioids and benzodiazepines. I highlighted the dangers of using these drugs together and discussed the adverse events including respiratory suppression, overdose, cognitive impairment and importance of compliance with current regimen. We will continue to monitor and adjust as indicated.   Last Oral Swab Performed on 04/16/2018, it was consistent.   Pain Inventory Average Pain 6 Pain Right Now 6 My pain is dull and aching  In the last 24 hours, has pain interfered with the following? General activity 8 Relation with others 6 Enjoyment of life 10 What TIME of day is your pain at its worst? all Sleep (in general) Good  Pain is worse with: walking, standing and some activites Pain improves with: medication Relief from Meds: 4  Mobility walk with assistance use a cane ability to climb steps?  no do you drive?  yes  Function retired  Neuro/Psych tremor trouble walking  Prior Studies Any changes since last visit?  no  Physicians involved in your care Any changes since  last visit?  no   Family History  Problem Relation Age of Onset  . Parkinsonism Father   . Dementia Father   . Cancer Father        Throat  . Colon cancer Mother        died in 52s  . Cancer Mother        Colon   Social History   Socioeconomic History  . Marital status: Married    Spouse name: Not on file  . Number of children: Not on file  . Years of education: Not on file  . Highest education level: Not on file  Occupational History  . Occupation: retired    Comment: maintenance; electrician  Social Needs  . Financial resource strain: Not on file  . Food insecurity:    Worry: Not on file    Inability: Not on file  . Transportation needs:    Medical: Not on file    Non-medical: Not on file  Tobacco Use  . Smoking status: Former Smoker    Packs/day: 2.00    Years: 28.00    Pack years: 56.00    Types: Cigarettes    Last attempt to quit: 05/31/1987    Years since quitting: 31.2  . Smokeless tobacco: Never Used  . Tobacco comment: quit smoking "in my 30's"  Substance and Sexual Activity  . Alcohol use: Yes    Alcohol/week: 0.0 standard drinks    Comment: rare, mikes hard lemonade  .  Drug use: No    Types: Marijuana    Comment: hx of-4-5 yrs ago  . Sexual activity: Not Currently  Lifestyle  . Physical activity:    Days per week: Not on file    Minutes per session: Not on file  . Stress: Not on file  Relationships  . Social connections:    Talks on phone: Not on file    Gets together: Not on file    Attends religious service: Not on file    Active member of club or organization: Not on file    Attends meetings of clubs or organizations: Not on file    Relationship status: Not on file  Other Topics Concern  . Not on file  Social History Narrative   Married 33 years in 2015. Lived together for 12 years. 2 boys from first wife. 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       Hobbies: yardwork, [place] barn, woodwork, Orthoptist   Past Surgical History:  Procedure Laterality Date  . ABDOMINAL AORTIC ANEURYSM REPAIR  2010  . CAROTID ENDARTERECTOMY Left 09/20/2004  . CATARACT EXTRACTION, BILATERAL     2016-2017  . COLONOSCOPY    . CORONARY ARTERY BYPASS GRAFT N/A 06/27/2018   Procedure: CORONARY ARTERY BYPASS GRAFTING (CABG), ON PUMP, TIMES FOUR, USING LEFT INTERNAL MAMMARY ARTERY AND ENDOSCOPICALLY HARVESTED LEFT GREATER SAPHENOUS VEIN;  Surgeon: Melrose Nakayama, MD;  Location: [facility];  Service: Open Heart Surgery;  Laterality: N/A;  . ENDARTERECTOMY Right 10/12/2015   Procedure: RIGHT CAROTID ENDARTERECTOMY WITH PATCH ANGIOPLASTY;  Surgeon: Elam Dutch, MD;  Location: [facility];  Service: Vascular;  Laterality: Right;  . INGUINAL HERNIA REPAIR Left 02/05/2013   Procedure: HERNIA REPAIR INGUINAL ADULT;  Surgeon: Joyice Faster. Cornett, MD;  Location: [facility];  Service: General;  Laterality: Left;  . INGUINAL HERNIA REPAIR Left   . INSERTION OF MESH Left 02/05/2013   Procedure: INSERTION OF MESH;  Surgeon: Joyice Faster. Cornett, MD;  Location: [facility];  Service: General;  Laterality: Left;  . LEFT HEART CATH AND CORONARY ANGIOGRAPHY N/A 06/21/2018   Procedure: LEFT HEART CATH AND CORONARY ANGIOGRAPHY;  Surgeon: Jettie Booze, MD;  Location: [facility];  Service: Cardiovascular;  Laterality: N/A;  . SHOULDER ARTHROSCOPY WITH ROTATOR CUFF REPAIR AND SUBACROMIAL DECOMPRESSION Left 05/01/2014   Procedure: LEFT ARTHROSCOPY SHOULDER SUBACROMIAL DECOMPRESSION,DISTAL CLAVICAL RESECTION AND ROTATOR CUFF REPAIR;  Surgeon: Marin Shutter, MD;  Location: [facility];  Service: Orthopedics;  Laterality: Left;  . TEE WITHOUT CARDIOVERSION N/A 06/27/2018   Procedure: TRANSESOPHAGEAL ECHOCARDIOGRAM (TEE);  Surgeon: Melrose Nakayama, MD;  Location: [facility];  Service: Open Heart Surgery;  Laterality: N/A;  .  TESTICLAR CYST EXCISION Left   . TONSILLECTOMY    . VASECTOMY     Past Medical History:  Diagnosis Date  . Arthritis    DDD- Lower Back  . Bradycardia   . CAP (community acquired pneumonia) 03/17/2015   "THAT'S WHAT THEY ARE THINKING; THEY ARE NOT SURE"  . Carotid artery occlusion   . Chest pain 06/19/2018  . Chronic lower back pain    DDD  . CKD (chronic kidney disease)    Dr. Corliss Parish  . Diverticulosis   . Enlarged prostate    takes Flomax daily  . History of  blood transfusion    "probably when they did aortic aneurysm"  . History of colon polyps    benign  . Hyperlipidemia   . Hypertension    takes Amlodipine and Zebeta daily  . Insomnia    takes Melatonin nightly  . Joint pain   . 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     . Muscle spasm    takes Zanaflex daily as needed  . Peripheral edema    takes Furosemide daily  . Pneumonia    hx of   . Rheumatic fever 1946  . Rheumatoid arthritis ([place])   . Short-term memory loss    minimal  . Shortness of breath dyspnea    with exertion but lasts very short period of time  . TIA (transient ischemic attack)    x 2;takes Plavix daily  . Urinary frequency    takes Flomax daily  . Urinary urgency    BP 130/73   Pulse 61   Ht 5' 7.5" (1.715 m)   Wt 175 lb (79.4 kg)   SpO2 95%   BMI 27.00 kg/m   Opioid Risk Score:   Fall Risk Score:  `1  Depression screen PHQ 2/9  Depression screen [facility] 2/9 08/09/2018 04/16/2018 01/22/2018 10/19/2017 10/05/2017 09/25/2017 07/03/2017  Decreased Interest 0 0 0 0 0 0 0  Down, Depressed, Hopeless 0 0 0 0 0 0 0  PHQ - 2 Score 0 0 0 0 0 0 0  Some recent data might be hidden     Review of Systems  Constitutional: Negative.   HENT: Negative.   Eyes: Negative.   Respiratory: Negative.   Cardiovascular: Negative.   Gastrointestinal: Negative.   Endocrine: Negative.   Genitourinary: Negative.   Musculoskeletal: Positive for arthralgias, gait problem and  myalgias.  Skin: Negative.   Allergic/Immunologic: Negative.   Neurological: Positive for tremors.  Hematological: Negative.   Psychiatric/Behavioral: Negative.   All other systems reviewed and are negative.      Objective:   Physical Exam Vitals signs and nursing note reviewed.  Constitutional:      Appearance: Normal appearance.  Neck:     Musculoskeletal: Normal range of motion and neck supple.  Cardiovascular:     Rate and Rhythm: Normal rate and regular rhythm.     Pulses: Normal pulses.     Heart sounds: Normal heart sounds.  Pulmonary:     Effort: Pulmonary effort is normal.     Breath sounds: Normal breath sounds.  Musculoskeletal:     Comments: Normal Muscle Bulk and Muscle Testing Reveals:  Upper Extremities:Full ROM and Muscle Strength 5/5  Lumbar Paraspinal Tendernress: L-4-L-5   Lower Extremities: Full ROM and Muscle Strength 5/5 Arises from chair slowly using cane for support Antalgic Gait   Skin:    General: Skin is warm and dry.  Neurological:     Mental Status: He is alert and oriented to person, place, and time.  Psychiatric:        Mood and Affect: Mood normal.        Behavior: Behavior normal.           Assessment & Plan:  1. Lumbar Spondylosis/ Lumbar Stenosis:08/10/2018 Continue current medication regime. Refilled:Oxycodone 7.5/325 mg one tablet twice daily as needed, may take an extra tablet when pain is moderate 10 days out of the month.for #70.08/10/2018 We will continue the opioid monitoring program, this consists of regular clinic visits, examinations, urine drug screen, pill counts as well as  use of [place] Controlled Substance Reporting System. 2. ChronicBilateralKnee Pain:S/P Cortisone Injection by Dr Paulla Fore on 03/14/2018, note was reviewed.Orthopedist Following Dr. Paulla Fore.08/10/2018 3. Right Hip Pain: Orthopedist Following. 08/10/2018   18minutes of face to face patient care time was spent during this visit. All  questions were encouraged and answered.  F/U in 1 month

## 2018-08-10 NOTE — Telephone Encounter (Signed)
I honestly want to keep him out of the office right now- I would recheck perhaps 3 months honestly (as long as things are calmer) or he should let us know if new or worsening symptoms- common in times of stress for this # to be slightly high and he has had a lot going on.

## 2018-08-10 NOTE — Telephone Encounter (Signed)
See note

## 2018-08-13 ENCOUNTER — Ambulatory Visit
Admission: RE | Admit: 2018-08-13 | Discharge: 2018-08-13 | Disposition: A | Discharge status: Home / Self Care | Payer: Medicare HMO | Source: Ambulatory Visit | Attending: Thoracic Surgery (Cardiothoracic Vascular Surgery) | Admitting: Thoracic Surgery (Cardiothoracic Vascular Surgery)

## 2018-08-13 ENCOUNTER — Encounter: Payer: Self-pay | Admitting: Thoracic Surgery (Cardiothoracic Vascular Surgery)

## 2018-08-13 ENCOUNTER — Ambulatory Visit (INDEPENDENT_AMBULATORY_CARE_PROVIDER_SITE_OTHER): Payer: Self-pay | Admitting: Thoracic Surgery (Cardiothoracic Vascular Surgery)

## 2018-08-13 ENCOUNTER — Other Ambulatory Visit: Payer: Self-pay

## 2018-08-13 VITALS — BP 148/80 | HR 72 | Resp 16 | Ht 67.0 in | Wt 174.6 lb

## 2018-08-13 DIAGNOSIS — J90 Pleural effusion, not elsewhere classified: Secondary | ICD-10-CM

## 2018-08-13 DIAGNOSIS — J986 Disorders of diaphragm: Secondary | ICD-10-CM | POA: Diagnosis not present

## 2018-08-13 DIAGNOSIS — J9811 Atelectasis: Secondary | ICD-10-CM | POA: Diagnosis not present

## 2018-08-13 DIAGNOSIS — I2510 Atherosclerotic heart disease of native coronary artery without angina pectoris: Secondary | ICD-10-CM

## 2018-08-13 DIAGNOSIS — Z951 Presence of aortocoronary bypass graft: Secondary | ICD-10-CM

## 2018-08-13 MED ORDER — FUROSEMIDE 20 MG PO TABS: 20.0000 mg | ORAL_TABLET | ORAL | 1 refills | Status: DC

## 2018-08-13 NOTE — Progress Notes (Signed)
[address]       [place] [address]             [phone]       HPI: Kevin [name] returns for scheduled follow-up visit  Kevin [name] is a 78 year old man with a history of hypertension, hyperlipidemia, PAD, carotid endarterectomy, TIA, ruptured AAA, stage IV chronic kidney disease, rheumatoid arthritis, and coronary disease.  He presented in January with unstable angina.  He ruled out for MI.  He was found to have three-vessel coronary disease.  He underwent coronary bypass grafting x4 on 06/27/2018.  He did have atrial fibrillation postoperatively which was treated with amiodarone.  He converted back to sinus prior to discharge.  He was discharged on Coumadin.  I saw him in the office 2 weeks ago.  He was still having some incisional pain but otherwise felt well.  His chest x-ray showed a moderate left pleural effusion and possible elevation of left hemidiaphragm.  I attempted thoracentesis but did not get a significant amount of fluid.  I gave him a steroid taper and increase his Lasix from every other day to daily.  He now returns for follow-up.  He says he is feeling well.  He does have some incisional pain, but is not taking any narcotics.  He has not had any anginal pain.  He has not had any shortness of breath.  He denies swelling in his legs.  Past Medical History:  Diagnosis Date  . Arthritis    DDD- Lower Back  . Bradycardia   . CAP (community acquired pneumonia) 03/17/2015   "THAT'S WHAT THEY ARE THINKING; THEY ARE NOT SURE"  . Carotid artery occlusion   . Chest pain 06/19/2018  . Chronic lower back pain    DDD  . CKD (chronic kidney disease)    Dr. Corliss Parish  . Diverticulosis   . Enlarged prostate    takes Flomax daily  . History of blood transfusion    "probably when they did aortic aneurysm"  . History of colon polyps    benign  . Hyperlipidemia   . Hypertension    takes Amlodipine and Zebeta daily  . Insomnia    takes Melatonin  nightly  . Joint pain   . 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     . Muscle spasm    takes Zanaflex daily as needed  . Peripheral edema    takes Furosemide daily  . Pneumonia    hx of   . Rheumatic fever 1946  . Rheumatoid arthritis ([place])   . Short-term memory loss    minimal  . Shortness of breath dyspnea    with exertion but lasts very short period of time  . TIA (transient ischemic attack)    x 2;takes Plavix daily  . Urinary frequency    takes Flomax daily  . Urinary urgency     Current Outpatient Medications  Medication Sig Dispense Refill  . albuterol (VENTOLIN HFA) 108 (90 Base) MCG/ACT inhaler Inhale 2 puffs into the lungs every 6 (six) hours as needed for wheezing or shortness of breath.    Marland Kitchen amiodarone (PACERONE) 200 MG tablet Take 1 tablet (200 mg total) by mouth 2 (two) times daily. For 10 days, then decrease to 200 mg daily (Patient taking differently: Take 200 mg by mouth daily. ) 60 tablet 1  . aspirin EC 81 MG EC tablet Take 1 tablet (81 mg total) by mouth daily.    Marland Kitchen  atorvastatin (LIPITOR) 20 MG tablet Take 1 tablet (20 mg total) by mouth daily. 90 tablet 1  . BIOTIN PO Take 1 tablet by mouth daily.    . Cholecalciferol (VITAMIN D-3) 1000 UNITS CAPS Take 1,000 Units by mouth daily.     . diazepam (VALIUM) 5 MG tablet TAKE 1/2 TO 1 TABLET q8 hours PRN pain sleep/muscle spasm-only if wife is around+can watch you closely when taking due to sedation risk 90 tablet 0  . fenofibrate 160 MG tablet TAKE 1 TABLET EVERY DAY 90 tablet 3  . furosemide (LASIX) 20 MG tablet Take 1 tablet (20 mg total) by mouth every other day. 45 tablet 1  . gabapentin (NEURONTIN) 300 MG capsule Take 1 capsule (300 mg total) by mouth 2 (two) times daily. 180 capsule 1  . Melatonin 10 MG TABS Take 10 mg by mouth at bedtime.     . metoprolol tartrate (LOPRESSOR) 25 MG tablet Take 1 tablet (25 mg total) by mouth 2 (two) times daily. 180 tablet 3  . oxyCODONE-acetaminophen  (PERCOCET) 7.5-325 MG tablet Take 1 tablet by mouth every 8 (eight) hours as needed for severe pain. 70 tablet 0  . tamsulosin (FLOMAX) 0.4 MG CAPS capsule TAKE 1 CAPSULE EVERY DAY 90 capsule 3  . tiZANidine (ZANAFLEX) 4 MG tablet TAKE 1 TABLET EVERY 6 HOURS AS NEEDED FOR MUSCLE SPASM(S) 90 tablet 1  . triamcinolone cream (KENALOG) 0.1 % APPLY 1 APPLICATION 2 TIMES DAILY TOPICALLY FOR 7-10 DAYS 80 g 0  . vitamin B-12 (CYANOCOBALAMIN) 1000 MCG tablet Take 1,000 mcg by mouth daily.    Marland Kitchen warfarin (COUMADIN) 2.5 MG tablet Take as directed by [facility] 100 tablet 1   No current facility-administered medications for this visit.     Physical Exam BP (!) 148/80 (BP Location: Right Arm, Patient Position: Sitting, Cuff Size: Large)   Pulse 72   Resp 16   Ht 5\' 7"  (1.702 m)   Wt 174 lb 9.6 oz (79.2 kg)   SpO2 96% Comment: RA  BMI 27.62 kg/m  78 year old man in no acute distress Alert and oriented x3 with no focal deficits Lungs diminished at left base, otherwise clear Cardiac regular rate and rhythm normal S1 and S2 Sternum stable, incision well-healed No peripheral edema  Diagnostic Tests: CHEST - 2 VIEW  COMPARISON:  07/31/2018  FINDINGS: Sequelae of CABG are again identified. The cardiomediastinal silhouette is unchanged. There is persistent elevation of the left hemidiaphragm with left basilar lung opacity compatible with atelectasis. The previously described left pleural effusion has largely resolved with at most trace pleural fluid remaining. The right lung is clear aside from small calcified granulomas in the apex. No pneumothorax is identified. No acute osseous abnormality is seen.  IMPRESSION: Near complete resolution of left pleural effusion with persistent left hemidiaphragm elevation and left basilar atelectasis.   Electronically Signed   By: Kevin [name] M.D.   On: 08/13/2018 15:46 I personally reviewed the chest x-ray images and concur with the findings  noted above  Impression: Kevin [name] is a 78 year old gentleman with a history of hypertension, hyperlipidemia, PAD, carotid endarterectomy, TIA, ruptured AAA, stage IV chronic kidney disease, rheumatoid arthritis, and coronary disease.  He underwent coronary bypass grafting x3 on June 27, 2018.    Postoperative atrial fibrillation-currently has a regular rhythm at home in sinus rhythm.  He remains on Coumadin.  Will defer to cardiology as to when to discontinue that.  Elevated left hemidiaphragm/pleural effusion-this pleural effusion has pretty much  resolved.  He does have a mildly elevated left hemidiaphragm.  I will plan to repeat a chest x-ray in about 6 months to see if there is any change in that.  He is not particularly symptomatic from that so unless he becomes symptomatic there is really nothing else to do with that.  He is 6 weeks out from surgery now so there is no restrictions on his activities, but he was cautioned to build into new activities gradually.  I instructed him to contact cardiac rehab and begin that program.  Plan: Cardiac rehab Return in 6 months with PA lateral chest x-ray  Melrose Nakayama, MD [facility] [phone]

## 2018-08-13 NOTE — Telephone Encounter (Signed)
Patient wife notified of update  and verbalized understanding. 

## 2018-08-13 NOTE — Patient Instructions (Signed)
Change lasix back to every other day  There are no restrictions on your activity. You should build into new activities gradually  You may begin cardiac rehab  Return in 6 months with a repeat CXR

## 2018-08-15 ENCOUNTER — Telehealth (HOSPITAL_COMMUNITY): Payer: Self-pay

## 2018-08-15 NOTE — Telephone Encounter (Signed)
Returned pt wife call and she stated that pt is very interested in the program and I would when we are back scheduling for CR. Tedra [place]. Support Rep II

## 2018-08-22 ENCOUNTER — Telehealth: Payer: Self-pay

## 2018-08-22 NOTE — Telephone Encounter (Signed)

## 2018-08-24 ENCOUNTER — Ambulatory Visit (INDEPENDENT_AMBULATORY_CARE_PROVIDER_SITE_OTHER): Payer: Medicare HMO | Admitting: Pharmacist

## 2018-08-24 DIAGNOSIS — Z5181 Encounter for therapeutic drug level monitoring: Secondary | ICD-10-CM | POA: Diagnosis not present

## 2018-08-24 DIAGNOSIS — I4891 Unspecified atrial fibrillation: Secondary | ICD-10-CM | POA: Diagnosis not present

## 2018-08-24 LAB — POCT INR: INR: 1.7 — AB (ref 2.0–3.0)

## 2018-09-10 ENCOUNTER — Encounter: Payer: Medicare HMO | Attending: Physical Medicine & Rehabilitation | Admitting: Registered Nurse

## 2018-09-10 ENCOUNTER — Encounter: Payer: Self-pay | Admitting: Registered Nurse

## 2018-09-10 ENCOUNTER — Other Ambulatory Visit: Payer: Self-pay

## 2018-09-10 VITALS — BP 123/79 | HR 85 | Ht 68.0 in | Wt 180.0 lb

## 2018-09-10 DIAGNOSIS — M47816 Spondylosis without myelopathy or radiculopathy, lumbar region: Secondary | ICD-10-CM | POA: Insufficient documentation

## 2018-09-10 DIAGNOSIS — M1712 Unilateral primary osteoarthritis, left knee: Secondary | ICD-10-CM | POA: Insufficient documentation

## 2018-09-10 DIAGNOSIS — M25562 Pain in left knee: Secondary | ICD-10-CM

## 2018-09-10 DIAGNOSIS — G894 Chronic pain syndrome: Secondary | ICD-10-CM | POA: Diagnosis not present

## 2018-09-10 DIAGNOSIS — Z79899 Other long term (current) drug therapy: Secondary | ICD-10-CM | POA: Diagnosis not present

## 2018-09-10 DIAGNOSIS — M48061 Spinal stenosis, lumbar region without neurogenic claudication: Secondary | ICD-10-CM

## 2018-09-10 DIAGNOSIS — G8929 Other chronic pain: Secondary | ICD-10-CM | POA: Diagnosis not present

## 2018-09-10 DIAGNOSIS — M25561 Pain in right knee: Secondary | ICD-10-CM | POA: Diagnosis not present

## 2018-09-10 DIAGNOSIS — I739 Peripheral vascular disease, unspecified: Secondary | ICD-10-CM | POA: Insufficient documentation

## 2018-09-10 DIAGNOSIS — M25551 Pain in right hip: Secondary | ICD-10-CM | POA: Insufficient documentation

## 2018-09-10 DIAGNOSIS — Z5181 Encounter for therapeutic drug level monitoring: Secondary | ICD-10-CM | POA: Diagnosis not present

## 2018-09-10 MED ORDER — OXYCODONE-ACETAMINOPHEN 7.5-325 MG PO TABS: 1.0000 | ORAL_TABLET | Freq: Three times a day (TID) | ORAL | 0 refills | Status: DC | PRN

## 2018-09-10 NOTE — Progress Notes (Signed)
Subjective:    Patient ID: Kevin [name], male    DOB: 10/21/1940, 78 y.o.   MRN: [id number]  HPI: Kevin [name] is a 78 y.o. male his appointment was changed, due to national recommendations of social distancing due to [place] 19, an audio/video telehealth visit is felt to be most appropriate for this patient at this time.  See Chart message from today for the patient's consent to telehealth from [place].     He states his pain is located in his lower back, right hip and bilateral knees. He rates his pain 6. His  current exercise regime is walking   Mr. Dacquisto  Morphine equivalent is 32.81 MME. He is also prescribed Diazepam by Dr. [name]. We have discussed the black box warning of using opioids and benzodiazepines. I highlighted the dangers of using these drugs together and discussed the adverse events including respiratory suppression, overdose, cognitive impairment and importance of compliance with current regimen. We will continue to monitor and adjust as indicated.   Last Oral Swab was Performed on 04/16/2018, it was consistent.   Geryl Rankins CMA asked the Health and History Questions. This provider and Geryl Rankins verified we were speaking with the correct person using two identifiers.  Pain Inventory Average Pain 4 Pain Right Now 6 My pain is constant and aching  In the last 24 hours, has pain interfered with the following? General activity 8 Relation with others 0 Enjoyment of life 5 What TIME of day is your pain at its worst? varies Sleep (in general) Good  Pain is worse with: walking and some activites Pain improves with: rest and medication Relief from Meds: 10  Mobility walk without assistance walk with assistance use a cane how many minutes can you walk? 10 ability to climb steps?  yes do you drive?  yes  Function retired  Neuro/Psych bladder control problems bowel control problems weakness tremor dizziness   Prior Studies Any changes since last visit?  no  Physicians involved in your care Any changes since last visit?  no   Family History  Problem Relation Age of Onset  . Parkinsonism Father   . Dementia Father   . Cancer Father        Throat  . Colon cancer Mother        died in 9s  . Cancer Mother        Colon   Social History   Socioeconomic History  . Marital status: Married    Spouse name: Not on file  . Number of children: Not on file  . Years of education: Not on file  . Highest education level: Not on file  Occupational History  . Occupation: retired    Comment: maintenance; electrician  Social Needs  . Financial resource strain: Not on file  . Food insecurity:    Worry: Not on file    Inability: Not on file  . Transportation needs:    Medical: Not on file    Non-medical: Not on file  Tobacco Use  . Smoking status: Former Smoker    Packs/day: 2.00    Years: 28.00    Pack years: 56.00    Types: Cigarettes    Last attempt to quit: 05/31/1987    Years since quitting: 31.3  . Smokeless tobacco: Never Used  . Tobacco comment: quit smoking "in my 65's"  Substance and Sexual Activity  . Alcohol use: Yes    Alcohol/week: 0.0 standard drinks  Comment: rare, mikes hard lemonade  . Drug use: No    Types: Marijuana    Comment: hx of-4-5 yrs ago  . Sexual activity: Not Currently  Lifestyle  . Physical activity:    Days per week: Not on file    Minutes per session: Not on file  . Stress: Not on file  Relationships  . Social connections:    Talks on phone: Not on file    Gets together: Not on file    Attends religious service: Not on file    Active member of club or organization: Not on file    Attends meetings of clubs or organizations: Not on file    Relationship status: Not on file  Other Topics Concern  . Not on file  Social History Narrative   Married 33 years in 2015. Lived together for 12 years. 2 boys from first wife. 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       Hobbies: yardwork, [place] barn, woodwork, Orthoptist   Past Surgical History:  Procedure Laterality Date  . ABDOMINAL AORTIC ANEURYSM REPAIR  2010  . CAROTID ENDARTERECTOMY Left 09/20/2004  . CATARACT EXTRACTION, BILATERAL     2016-2017  . COLONOSCOPY    . CORONARY ARTERY BYPASS GRAFT N/A 06/27/2018   Procedure: CORONARY ARTERY BYPASS GRAFTING (CABG), ON PUMP, TIMES FOUR, USING LEFT INTERNAL MAMMARY ARTERY AND ENDOSCOPICALLY HARVESTED LEFT GREATER SAPHENOUS VEIN;  Surgeon: Melrose Nakayama, MD;  Location: [facility];  Service: Open Heart Surgery;  Laterality: N/A;  . ENDARTERECTOMY Right 10/12/2015   Procedure: RIGHT CAROTID ENDARTERECTOMY WITH PATCH ANGIOPLASTY;  Surgeon: Elam Dutch, MD;  Location: [facility];  Service: Vascular;  Laterality: Right;  . INGUINAL HERNIA REPAIR Left 02/05/2013   Procedure: HERNIA REPAIR INGUINAL ADULT;  Surgeon: Joyice Faster. Cornett, MD;  Location: [facility];  Service: General;  Laterality: Left;  . INGUINAL HERNIA REPAIR Left   . INSERTION OF MESH Left 02/05/2013   Procedure: INSERTION OF MESH;  Surgeon: Joyice Faster. Cornett, MD;  Location: [facility];  Service: General;  Laterality: Left;  . LEFT HEART CATH AND CORONARY ANGIOGRAPHY N/A 06/21/2018   Procedure: LEFT HEART CATH AND CORONARY ANGIOGRAPHY;  Surgeon: Jettie Booze, MD;  Location: [facility];  Service: Cardiovascular;  Laterality: N/A;  . SHOULDER ARTHROSCOPY WITH ROTATOR CUFF REPAIR AND SUBACROMIAL DECOMPRESSION Left 05/01/2014   Procedure: LEFT ARTHROSCOPY SHOULDER SUBACROMIAL DECOMPRESSION,DISTAL CLAVICAL RESECTION AND ROTATOR CUFF REPAIR;  Surgeon: Marin Shutter, MD;  Location: [facility];  Service: Orthopedics;  Laterality: Left;  . TEE WITHOUT CARDIOVERSION N/A 06/27/2018   Procedure: TRANSESOPHAGEAL ECHOCARDIOGRAM (TEE);   Surgeon: Melrose Nakayama, MD;  Location: [facility];  Service: Open Heart Surgery;  Laterality: N/A;  . TESTICLAR CYST EXCISION Left   . TONSILLECTOMY    . VASECTOMY     Past Medical History:  Diagnosis Date  . Arthritis    DDD- Lower Back  . Bradycardia   . CAP (community acquired pneumonia) 03/17/2015   "THAT'S WHAT THEY ARE THINKING; THEY ARE NOT SURE"  . Carotid artery occlusion   . Chest pain 06/19/2018  . Chronic lower back pain    DDD  . CKD (chronic kidney disease)    Dr. Corliss Parish  . Diverticulosis   . Enlarged prostate  takes Flomax daily  . History of blood transfusion    "probably when they did aortic aneurysm"  . History of colon polyps    benign  . Hyperlipidemia   . Hypertension    takes Amlodipine and Zebeta daily  . Insomnia    takes Melatonin nightly  . Joint pain   . 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     . Muscle spasm    takes Zanaflex daily as needed  . Peripheral edema    takes Furosemide daily  . Pneumonia    hx of   . Rheumatic fever 1946  . Rheumatoid arthritis ([place])   . Short-term memory loss    minimal  . Shortness of breath dyspnea    with exertion but lasts very short period of time  . TIA (transient ischemic attack)    x 2;takes Plavix daily  . Urinary frequency    takes Flomax daily  . Urinary urgency    BP 123/79   Pulse 85   Ht 5\' 8"  (1.727 m)   Wt 180 lb (81.6 kg)   BMI 27.37 kg/m   Opioid Risk Score:   Fall Risk Score:  `1  Depression screen PHQ 2/9  Depression screen [facility] 2/9 08/09/2018 04/16/2018 01/22/2018 10/19/2017 10/05/2017 09/25/2017 07/03/2017  Decreased Interest 0 0 0 0 0 0 0  Down, Depressed, Hopeless 0 0 0 0 0 0 0  PHQ - 2 Score 0 0 0 0 0 0 0  Some recent data might be hidden    Review of Systems  Constitutional: Negative.   HENT: Negative.   Respiratory: Negative.   Cardiovascular: Negative.   Gastrointestinal: Positive for constipation.  Endocrine: Negative.    Genitourinary: Positive for difficulty urinating.  Musculoskeletal: Positive for arthralgias, back pain and gait problem.  Skin: Negative.   Neurological: Positive for tremors and weakness.  Psychiatric/Behavioral: Negative.        Objective:   Physical Exam        Assessment & Plan:  1. Lumbar Spondylosis/ Lumbar Stenosis:09/10/2018 Continue current medication regime. Refilled:Oxycodone 7.5/325 mg one tablet twice daily as needed, may take an extra tablet when pain is moderate 10 days out of the month.for #70.09/10/2018 We will continue the opioid monitoring program, this consists of regular clinic visits, examinations, urine drug screen, pill counts as well as use of [place] Controlled Substance Reporting System. 2. ChronicBilateralKnee Pain:S/P Cortisone Injection by Dr Paulla Fore on 03/14/2018, note was reviewed.Orthopedist Following Dr. Paulla Fore.09/10/2018 3. Right Hip Pain: Orthopedist Following.09/10/2018  F/U in 1 month Telephone Call  Location of patient: In his Home Location of provider: Office Established patient Time spent on call: 15 minutes

## 2018-09-13 ENCOUNTER — Telehealth: Payer: Self-pay

## 2018-09-13 NOTE — Telephone Encounter (Signed)

## 2018-09-14 ENCOUNTER — Other Ambulatory Visit: Payer: Self-pay

## 2018-09-14 ENCOUNTER — Ambulatory Visit (INDEPENDENT_AMBULATORY_CARE_PROVIDER_SITE_OTHER): Payer: Medicare HMO

## 2018-09-14 DIAGNOSIS — I4891 Unspecified atrial fibrillation: Secondary | ICD-10-CM

## 2018-09-14 DIAGNOSIS — Z5181 Encounter for therapeutic drug level monitoring: Secondary | ICD-10-CM

## 2018-09-14 LAB — POCT INR: INR: 1.5 — AB (ref 2.0–3.0)

## 2018-09-19 ENCOUNTER — Ambulatory Visit (INDEPENDENT_AMBULATORY_CARE_PROVIDER_SITE_OTHER): Payer: Medicare HMO | Admitting: Family Medicine

## 2018-09-19 ENCOUNTER — Telehealth (HOSPITAL_COMMUNITY): Payer: Self-pay | Admitting: Cardiac Rehabilitation

## 2018-09-19 ENCOUNTER — Encounter: Payer: Self-pay | Admitting: Family Medicine

## 2018-09-19 VITALS — BP 118/68 | HR 68 | Ht 68.0 in | Wt 180.0 lb

## 2018-09-19 DIAGNOSIS — K5903 Drug induced constipation: Secondary | ICD-10-CM | POA: Diagnosis not present

## 2018-09-19 DIAGNOSIS — I10 Essential (primary) hypertension: Secondary | ICD-10-CM

## 2018-09-19 DIAGNOSIS — I5032 Chronic diastolic (congestive) heart failure: Secondary | ICD-10-CM | POA: Diagnosis not present

## 2018-09-19 NOTE — Telephone Encounter (Signed)
Phone call to patient to discuss outpatient cardiac rehab.  Pt is interested in participating once program reopens from [place] 19 precautions.  Pt is eager to learn lifestyle modifications to reduce risk factors. Pt exercise at home includes light walking using cane with his wife. Pt c/o DOE.   Pt mailed phase I educational packet for reinforcement. Pt is following Covid prevention measures. Pt offered emotional support and reassurance. Pt expressed appreciation for the call.  Pt verbalized understanding.  Andi Hence, RN, BSN Cardiac Pulmonary Rehab

## 2018-09-19 NOTE — Progress Notes (Signed)
Phone [phone]   Subjective:  Virtual visit via Video note. Chief complaint: Chief Complaint  Patient presents with  . Acute Visit  . Left side pain    Pain has been off and on. Sx are worse when he is unable to have BM. He has tried Miralax with minimal relief.    This visit type was conducted due to national recommendations for restrictions regarding the COVID-19 Pandemic (e.g. social distancing).  This format is felt to be most appropriate for this patient at this time balancing risks to patient and risks to population by having him in for in person visit.  No physical exam was performed (except for noted visual exam or audio findings with Telehealth visits).    Our team/I connected with Kevin [name] on 09/19/18 at 10:00 AM EDT by a video enabled telemedicine application ([email or web address]) and verified that I am speaking with the correct person using two identifiers.  Location patient: Home-[place] Location provider: [facility], office Persons participating in the virtual visit:  patient  Our team/I discussed the limitations of evaluation and management by telemedicine and the availability of in person appointments. In light of current covid-19 pandemic, patient also understands that we are trying to protect them by minimizing in office contact if at all possible.  The patient expressed consent for telemedicine visit and agreed to proceed. Patient understands insurance will be billed.   ROS- no fever, chills. Unintentional weight loss. Pain after bypass- just along his scar at times. Still having some shortness of breath.    Past Medical History-  Patient Active Problem List   Diagnosis Date Noted  . Afib ([place]) 07/06/2018    Priority: High  . 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 PAD (peripheral artery disease) ([place]) 01/08/2016    Priority: High  . Diastolic CHF ([place]) 26/20/3559    Priority: High  . CKD (chronic kidney disease), stage III ([place]) 01/27/2014    Priority: High  . AAA  (abdominal aortic aneurysm, ruptured) ([place]) 01/17/2013    Priority: High  . Carotid artery stenosis s/p L carotid endarterectomy 01/12/2012    Priority: High  . History of CVA (cerebrovascular accident) 11/17/2011    Priority: High  . Chronic pain syndrome 12/23/2009    Priority: High  . Constipation 09/19/2017    Priority: Medium  . Personal history of colonic polyps 05/15/2013    Priority: Medium  . Essential tremor 10/05/2009    Priority: Medium  . UNSPECIFIED ANEMIA 12/05/2008    Priority: Medium  . BPH (benign prostatic hyperplasia) 06/04/2007    Priority: Medium  . Fasting hyperglycemia 02/05/2007    Priority: Medium  . Hyperlipidemia 01/03/2007    Priority: Medium  . Essential hypertension 01/03/2007    Priority: Medium  . CAP (community acquired pneumonia) 03/17/2015    Priority: Low  . Family history of malignant neoplasm of gastrointestinal tract 05/15/2013    Priority: Low  . Inguinal hernia 05/01/2012    Priority: Low  . BURSITIS, HIP 12/21/2009    Priority: Low  . ACTINIC KERATOSIS, EAR, LEFT 03/09/2009    Priority: Low  . 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 Lumbar spondylosis 03/17/2017    Medications- reviewed and updated Current Outpatient Medications  Medication Sig Dispense Refill  . albuterol (VENTOLIN HFA) 108 (90 Base) MCG/ACT inhaler Inhale 2 puffs into the lungs every 6 (six) hours as needed  for wheezing or shortness of breath.    Marland Kitchen amiodarone (PACERONE) 200 MG tablet Take 1 tablet (200 mg total) by mouth 2 (two) times daily. For 10 days, then decrease to 200 mg daily (Patient taking differently: Take 200 mg by mouth daily. ) 60 tablet 1  . aspirin EC 81 MG EC tablet Take 1 tablet (81 mg total) by mouth daily.    Marland Kitchen atorvastatin (LIPITOR) 20 MG tablet Take 1 tablet (20 mg total) by mouth daily. 90 tablet 1  . BIOTIN PO Take 1 tablet by mouth daily.    .  Cholecalciferol (VITAMIN D-3) 1000 UNITS CAPS Take 1,000 Units by mouth daily.     . diazepam (VALIUM) 5 MG tablet TAKE 1/2 TO 1 TABLET q8 hours PRN pain sleep/muscle spasm-only if wife is around+can watch you closely when taking due to sedation risk 90 tablet 0  . fenofibrate 160 MG tablet TAKE 1 TABLET EVERY DAY 90 tablet 3  . furosemide (LASIX) 20 MG tablet Take 1 tablet (20 mg total) by mouth every other day. 45 tablet 1  . gabapentin (NEURONTIN) 300 MG capsule Take 1 capsule (300 mg total) by mouth 2 (two) times daily. 180 capsule 1  . Melatonin 10 MG TABS Take 10 mg by mouth at bedtime.     . metoprolol tartrate (LOPRESSOR) 25 MG tablet Take 1 tablet (25 mg total) by mouth 2 (two) times daily. 180 tablet 3  . oxyCODONE-acetaminophen (PERCOCET) 7.5-325 MG tablet Take 1 tablet by mouth every 8 (eight) hours as needed for severe pain. 70 tablet 0  . tamsulosin (FLOMAX) 0.4 MG CAPS capsule TAKE 1 CAPSULE EVERY DAY 90 capsule 3  . tiZANidine (ZANAFLEX) 4 MG tablet TAKE 1 TABLET EVERY 6 HOURS AS NEEDED FOR MUSCLE SPASM(S) 90 tablet 1  . triamcinolone cream (KENALOG) 0.1 % APPLY 1 APPLICATION 2 TIMES DAILY TOPICALLY FOR 7-10 DAYS 80 g 0  . vitamin B-12 (CYANOCOBALAMIN) 1000 MCG tablet Take 1,000 mcg by mouth daily.    Marland Kitchen warfarin (COUMADIN) 2.5 MG tablet Take as directed by [facility] Clinic 100 tablet 1   No current facility-administered medications for this visit.      Objective:  BP 118/68   Pulse 68   Ht 5\' 8"  (1.727 m)   Wt 180 lb (81.6 kg)   BMI 27.37 kg/m  Gen: NAD, resting comfortably Lungs: nonlabored, normal respiratory rate  Skin: appears dry, no obvious rash Patient points to lower abdomen particularly on left as source of pain     Assessment and Plan     Diastolic CHF ([place]) Patient is compliant with Lasix 20 mg every other day.  He minimizes fluid intake-we did discuss with MiraLAX use that he would need to increase fluid intake-at least 1 full glass of water with each  dose of MiraLAX.  He should let us know if has worsening shortness of breath or increased swelling or weight gain.  He states his weight has largely been stable recently.  No recent increased shortness of breath or edema reported.  Essential hypertension remains controlled on metoprolol 25 mg twice a day as well as Lasix 20 mg every other day.  Stable-continue current medication  Constipation S:having a bowel movement most days but had been having them every 2-3 days- had worsening left lower abdominal pain when didn't have BM- felt relief when he did. If he remembers to take the miralax seems to be more consistent. Has taken miralax eeryday but for 1 day in last  2 weeks. Has had less pain with more regular BMs- Bowel movements improve his left side pain as noted. Takes colace/Stool softener twice a day  Oj, prune juice and laxative in the hospital seemed to give good clean out- he asks about a full cleanout.  A/P: I strongly suspect patient's constipation is related to his long-term opioid use.  We discussed possibly using Amitiza but with potential side effect of shortness of breath and recent CABG-we thought that was less than ideal.  In the end we opted to use MiraLAX twice daily for up to 3 days when he is feeling more backed up or having more lower abdominal pain- as well as increase hydration slightly-needing to be cautious with diastolic CHF but also realizing increase MiraLAX use will require more fluids.  Outside of those 3 days-we will continue MiraLAX once a day.   #Hypertension-remains controlled on metoprolol 25 mg twice a day as well as Lasix 20 mg every other day.Stable-continue current medication.  On amlodipine in the past-remain off due to diastolic CHF and edema issues  #Follow-up I discussed with patient potential CT scan of his abdomen if no improvement in abdominal pain in the next month (he wanted to defer this for now particularly with covid 19 since he has been having pain for  at least a month.  Last colonoscopy was in 2015 and they recommended possible 5-year follow-up depending on health-given multiple comorbidities as well as COVID-19 pandemic-would like to avoid colonoscopy if at all possible at present.  Future Appointments  Date Time Provider [place]  09/28/2018  9:30 AM [facility]  10/09/2018  9:20 AM Bayard Hugger, NP [facility]  10/19/2018  2:00 PM Fay Records, MD [facility]   Lab/Order associations: Chronic diastolic congestive heart failure ([place])  Essential hypertension  Drug-induced constipation   Return precautions advised.  Garret Reddish, MD

## 2018-09-19 NOTE — Patient Instructions (Addendum)
Health Maintenance Due  Topic Date Due  . COLONOSCOPY-see discussion and notes 06/20/2018   Video visit

## 2018-09-19 NOTE — Assessment & Plan Note (Signed)
Patient is compliant with Lasix 20 mg every other day.  He minimizes fluid intake-we did discuss with MiraLAX use that he would need to increase fluid intake-at least 1 full glass of water with each dose of MiraLAX.  He should let us know if has worsening shortness of breath or increased swelling or weight gain.  He states his weight has largely been stable recently.  No recent increased shortness of breath or edema reported.

## 2018-09-19 NOTE — Assessment & Plan Note (Signed)
remains controlled on metoprolol 25 mg twice a day as well as Lasix 20 mg every other day.  Stable-continue current medication

## 2018-09-19 NOTE — Assessment & Plan Note (Signed)
S:having a bowel movement most days but had been having them every 2-3 days- had worsening left lower abdominal pain when didn't have BM- felt relief when he did. If he remembers to take the miralax seems to be more consistent. Has taken miralax eeryday but for 1 day in last 2 weeks. Has had less pain with more regular BMs- Bowel movements improve his left side pain as noted. Takes colace/Stool softener twice a day  Oj, prune juice and laxative in the hospital seemed to give good clean out- he asks about a full cleanout.  A/P: I strongly suspect patient's constipation is related to his long-term opioid use.  We discussed possibly using Amitiza but with potential side effect of shortness of breath and recent CABG-we thought that was less than ideal.  In the end we opted to use MiraLAX twice daily for up to 3 days when he is feeling more backed up or having more lower abdominal pain- as well as increase hydration slightly-needing to be cautious with diastolic CHF but also realizing increase MiraLAX use will require more fluids.  Outside of those 3 days-we will continue MiraLAX once a day.

## 2018-09-27 ENCOUNTER — Telehealth: Payer: Self-pay

## 2018-09-27 NOTE — Telephone Encounter (Signed)

## 2018-09-28 ENCOUNTER — Ambulatory Visit (INDEPENDENT_AMBULATORY_CARE_PROVIDER_SITE_OTHER): Payer: Medicare HMO | Admitting: *Deleted

## 2018-09-28 ENCOUNTER — Other Ambulatory Visit: Payer: Self-pay

## 2018-09-28 DIAGNOSIS — Z5181 Encounter for therapeutic drug level monitoring: Secondary | ICD-10-CM | POA: Diagnosis not present

## 2018-09-28 DIAGNOSIS — I4891 Unspecified atrial fibrillation: Secondary | ICD-10-CM | POA: Diagnosis not present

## 2018-09-28 LAB — POCT INR: INR: 1.9 — AB (ref 2.0–3.0)

## 2018-09-28 NOTE — Patient Instructions (Addendum)
Description   Spoke with pt and instructed pt to take 2 tablets today then start taking 1 tablet daily except for 1.5 tablets every Mondays and Fridays. Recheck INR in 2 weeks. [facility] [phone] Main [phone]

## 2018-10-01 ENCOUNTER — Encounter: Payer: Self-pay | Admitting: Family Medicine

## 2018-10-01 NOTE — Patient Instructions (Addendum)
Health Maintenance Due  Topic Date Due  . COLONOSCOPY Pt was told that he didn't need another one.  See discussion for visit today 06/20/2018    Depression screen [facility] 2/9 08/09/2018 04/16/2018 01/22/2018  Decreased Interest 0 0 0  Down, Depressed, Hopeless 0 0 0  PHQ - 2 Score 0 0 0  Some recent data might be hidden   Video visit

## 2018-10-01 NOTE — Progress Notes (Signed)
Phone [phone]   Subjective:  Virtual visit via Video note. Chief complaint: Chief Complaint  Patient presents with  . Flank Pain    Lt side.  x20months    This visit type was conducted due to national recommendations for restrictions regarding the COVID-19 Pandemic (e.g. social distancing).  This format is felt to be most appropriate for this patient at this time balancing risks to patient and risks to population by having him in for in person visit.  No physical exam was performed (except for noted visual exam or audio findings with Telehealth visits).    Our team/I connected with Larena Sox on 10/02/18 at  8:20 AM EDT by a video enabled telemedicine application ([email or web address]) and verified that I am speaking with the correct person using two identifiers.  Location patient: [place] Location provider: [facility], office Persons participating in the virtual visit:  patient  Our team/I discussed the limitations of evaluation and management by telemedicine and the availability of in person appointments. In light of current covid-19 pandemic, patient also understands that we are trying to protect them by minimizing in office contact if at all possible.  The patient expressed consent for telemedicine visit and agreed to proceed. Patient understands insurance will be billed.   ROS-no fever/chills/cough/new body aches/sore throat/reported anosmia  Past Medical History-  Patient Active Problem List   Diagnosis Date Noted  . Afib ([place]) 07/06/2018    Priority: High  . 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 PAD (peripheral artery disease) ([place]) 01/08/2016    Priority: High  . Diastolic CHF ([place]) 74/12/1446    Priority: High  . CKD (chronic kidney disease), stage IV ([place]) 01/27/2014    Priority: High  . AAA (abdominal aortic aneurysm, ruptured) ([place]) 01/17/2013    Priority: High  . Carotid artery stenosis s/p L carotid endarterectomy 01/12/2012    Priority: High  . History of CVA  (cerebrovascular accident) 11/17/2011    Priority: High  . Chronic pain syndrome 12/23/2009    Priority: High  . Constipation 09/19/2017    Priority: Medium  . Personal history of colonic polyps 05/15/2013    Priority: Medium  . Essential tremor 10/05/2009    Priority: Medium  . UNSPECIFIED ANEMIA 12/05/2008    Priority: Medium  . BPH (benign prostatic hyperplasia) 06/04/2007    Priority: Medium  . Fasting hyperglycemia 02/05/2007    Priority: Medium  . Hyperlipidemia 01/03/2007    Priority: Medium  . Essential hypertension 01/03/2007    Priority: Medium  . CAP (community acquired pneumonia) 03/17/2015    Priority: Low  . Family history of malignant neoplasm of gastrointestinal tract 05/15/2013    Priority: Low  . Inguinal hernia 05/01/2012    Priority: Low  . BURSITIS, HIP 12/21/2009    Priority: Low  . ACTINIC KERATOSIS, EAR, LEFT 03/09/2009    Priority: Low  . 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 Lumbar spondylosis 03/17/2017    Medications- reviewed and updated Current Outpatient Medications  Medication Sig Dispense Refill  . albuterol (VENTOLIN HFA) 108 (90 Base) MCG/ACT inhaler Inhale 2 puffs into the lungs every 6 (six) hours as needed for wheezing or shortness of breath.    Marland Kitchen amiodarone (PACERONE) 200 MG tablet Take 1 tablet (200 mg total) by mouth 2 (two) times daily. For 10 days, then decrease to 200 mg daily (Patient taking differently: Take 200  mg by mouth daily. ) 60 tablet 1  . aspirin EC 81 MG EC tablet Take 1 tablet (81 mg total) by mouth daily.    Marland Kitchen atorvastatin (LIPITOR) 20 MG tablet Take 1 tablet (20 mg total) by mouth daily. 90 tablet 1  . BIOTIN PO Take 1 tablet by mouth daily.    . Cholecalciferol (VITAMIN D-3) 1000 UNITS CAPS Take 1,000 Units by mouth daily.     . diazepam (VALIUM) 5 MG tablet TAKE 1/2 TO 1 TABLET q8 hours PRN pain sleep/muscle spasm-only if wife is  around+can watch you closely when taking due to sedation risk 90 tablet 0  . fenofibrate 160 MG tablet TAKE 1 TABLET EVERY DAY 90 tablet 3  . furosemide (LASIX) 20 MG tablet Take 1 tablet (20 mg total) by mouth every other day. 45 tablet 1  . gabapentin (NEURONTIN) 300 MG capsule Take 1 capsule (300 mg total) by mouth 2 (two) times daily. 180 capsule 1  . Melatonin 10 MG TABS Take 10 mg by mouth at bedtime.     . metoprolol tartrate (LOPRESSOR) 25 MG tablet Take 1 tablet (25 mg total) by mouth 2 (two) times daily. 180 tablet 3  . oxyCODONE-acetaminophen (PERCOCET) 7.5-325 MG tablet Take 1 tablet by mouth every 8 (eight) hours as needed for severe pain. 70 tablet 0  . tamsulosin (FLOMAX) 0.4 MG CAPS capsule TAKE 1 CAPSULE EVERY DAY 90 capsule 3  . tiZANidine (ZANAFLEX) 4 MG tablet TAKE 1 TABLET EVERY 6 HOURS AS NEEDED FOR MUSCLE SPASM(S) 90 tablet 1  . triamcinolone cream (KENALOG) 0.1 % APPLY 1 APPLICATION 2 TIMES DAILY TOPICALLY FOR 7-10 DAYS 80 g 0  . vitamin B-12 (CYANOCOBALAMIN) 1000 MCG tablet Take 1,000 mcg by mouth daily.    Marland Kitchen warfarin (COUMADIN) 2.5 MG tablet Take as directed by [facility] 100 tablet 1   No current facility-administered medications for this visit.      Objective:  BP 130/67 (BP Location: Left Wrist, Patient Position: Sitting, Cuff Size: Normal)   Pulse (!) 53   Ht 5\' 8"  (1.727 m)   Wt 175 lb (79.4 kg)   BMI 26.61 kg/m  Gen: NAD, resting comfortably Lungs: nonlabored, normal respiratory rate  Skin: appears dry, no obvious rash Points to left lower quadrant as source of pain     Assessment and Plan   # Left lower quadrant pain S:Patient now with 3 months of symptoms. He tried to do miralax more regularly and helped the pain some but it simply persists.  lowest pain gets to is 2-3- gets up to more significant levels perhaps 8-9 then improves with BM back down to 2-3 again A/P: 78 year old male with now 3 months of left lower quadrant pain.  Has chronic  kidney disease stage IV based on last GFR-unable to get IV contrast exam.  We will get some updated labs as per below.  We will get a noncontrast CT of abdomen pelvis. -Patient is technically due for colonoscopy but they stated this was dependent on her current medical status-he has multiple medical problems and if CT is unrevealing-would want to likely have him have a visit with GI-may be a virtual start with consideration of colonoscopy.  Please note screen him for any covid-19 symptoms and none noted before labs today-he does not have a mask but will get one when he comes in. Future Appointments  Date Time Provider [facility]  10/09/2018  9:20 AM Bayard Hugger, NP [facility]  10/12/2018  9:30 AM [facility]  10/19/2018  2:00 PM Fay Records, MD [facility]   Lab/Order associations: Left lower quadrant abdominal pain - Plan: CBC with Differential/Platelet, Comprehensive metabolic panel, CT Abdomen Pelvis Wo Contrast, POCT Urinalysis Dipstick (Automated)  CKD (chronic kidney disease), stage IV ([facility])  Return precautions advised.  Garret Reddish, MD

## 2018-10-02 ENCOUNTER — Ambulatory Visit (INDEPENDENT_AMBULATORY_CARE_PROVIDER_SITE_OTHER): Payer: Medicare HMO | Admitting: Family Medicine

## 2018-10-02 ENCOUNTER — Encounter: Payer: Self-pay | Admitting: Family Medicine

## 2018-10-02 VITALS — BP 130/67 | HR 53 | Ht 68.0 in | Wt 175.0 lb

## 2018-10-02 DIAGNOSIS — N184 Chronic kidney disease, stage 4 (severe): Secondary | ICD-10-CM | POA: Diagnosis not present

## 2018-10-02 DIAGNOSIS — R1032 Left lower quadrant pain: Secondary | ICD-10-CM

## 2018-10-02 LAB — COMPREHENSIVE METABOLIC PANEL
ALT: 21 U/L (ref 0–53)
AST: 26 U/L (ref 0–37)
Albumin: 4 g/dL (ref 3.5–5.2)
Alkaline Phosphatase: 33 U/L — ABNORMAL LOW (ref 39–117)
BUN: 29 mg/dL — ABNORMAL HIGH (ref 6–23)
CO2: 27 mEq/L (ref 19–32)
Calcium: 8.9 mg/dL (ref 8.4–10.5)
Chloride: 101 mEq/L (ref 96–112)
Creatinine, Ser: 2.65 mg/dL — ABNORMAL HIGH (ref 0.40–1.50)
GFR: 23.36 mL/min — ABNORMAL LOW (ref >60.00)
Glucose, Bld: 109 mg/dL — ABNORMAL HIGH (ref 70–99)
Potassium: 4.5 mEq/L (ref 3.5–5.1)
Sodium: 136 mEq/L (ref 135–145)
Total Bilirubin: 0.5 mg/dL (ref 0.2–1.2)
Total Protein: 6.8 g/dL (ref 6.0–8.3)

## 2018-10-02 LAB — POC URINALSYSI DIPSTICK (AUTOMATED)
Bilirubin, UA: NEGATIVE
Blood, UA: NEGATIVE
Glucose, UA: NEGATIVE
Ketones, UA: NEGATIVE
Leukocytes, UA: NEGATIVE
Nitrite, UA: NEGATIVE
Protein, UA: NEGATIVE
Spec Grav, UA: >=1.03 — AB (ref 1.010–1.025)
Urobilinogen, UA: 0.2 E.U./dL
pH, UA: 5.5 (ref 5.0–8.0)

## 2018-10-02 LAB — CBC WITH DIFFERENTIAL/PLATELET
Basophils Absolute: 0.1 10*3/uL (ref 0.0–0.1)
Basophils Relative: 1.7 % (ref 0.0–3.0)
Eosinophils Absolute: 0.5 10*3/uL (ref 0.0–0.7)
Eosinophils Relative: 6.8 % — ABNORMAL HIGH (ref 0.0–5.0)
HCT: 35.9 % — ABNORMAL LOW (ref 39.0–52.0)
Hemoglobin: 12 g/dL — ABNORMAL LOW (ref 13.0–17.0)
Lymphocytes Relative: 35.1 % (ref 12.0–46.0)
Lymphs Abs: 2.4 10*3/uL (ref 0.7–4.0)
MCHC: 33.4 g/dL (ref 30.0–36.0)
MCV: 89.8 fl (ref 78.0–100.0)
Monocytes Absolute: 0.6 10*3/uL (ref 0.1–1.0)
Monocytes Relative: 8.7 % (ref 3.0–12.0)
Neutro Abs: 3.3 10*3/uL (ref 1.4–7.7)
Neutrophils Relative %: 47.7 % (ref 43.0–77.0)
Platelets: 244 10*3/uL (ref 150.0–400.0)
RBC: 4 Mil/uL — ABNORMAL LOW (ref 4.22–5.81)
RDW: 16.1 % — ABNORMAL HIGH (ref 11.5–15.5)
WBC: 6.9 10*3/uL (ref 4.0–10.5)

## 2018-10-02 NOTE — Assessment & Plan Note (Signed)
Last GFR was under 30-we will list to CKD 4 for now

## 2018-10-02 NOTE — Addendum Note (Signed)
Addended by: Tomi Likens on: 10/02/2018 09:29 AM   Modules accepted: Orders

## 2018-10-03 ENCOUNTER — Telehealth: Payer: Self-pay | Admitting: Internal Medicine

## 2018-10-03 DIAGNOSIS — I10 Essential (primary) hypertension: Secondary | ICD-10-CM

## 2018-10-03 NOTE — Telephone Encounter (Signed)
Pt. Wife has smrtphone. [phone]     Virtual Visit Pre-Appointment Phone Call  "(Name), I am calling you today to discuss your upcoming appointment. We are currently trying to limit exposure to the virus that causes COVID-19 by seeing patients at home rather than in the office."  1. "What is the BEST phone number to call the day of the visit?" - include this in appointment notes  2. Do you have or have access to (through a family member/friend) a smartphone with video capability that we can use for your visit?" a. If yes - list this number in appt notes as cell (if different from BEST phone #) and list the appointment type as a VIDEO visit in appointment notes b. If no - list the appointment type as a PHONE visit in appointment notes  3. Confirm consent - "In the setting of the current Covid19 crisis, you are scheduled for a (phone or video) visit with your provider on (date) at (time).  Just as we do with many in-office visits, in order for you to participate in this visit, we must obtain consent.  If you'd like, I can send this to your mychart (if signed up) or email for you to review.  Otherwise, I can obtain your verbal consent now.  All virtual visits are billed to your insurance company just like a normal visit would be.  By agreeing to a virtual visit, we'd like you to understand that the technology does not allow for your provider to perform an examination, and thus may limit your provider's ability to fully assess your condition. If your provider identifies any concerns that need to be evaluated in person, we will make arrangements to do so.  Finally, though the technology is pretty good, we cannot assure that it will always work on either your or our end, and in the setting of a video visit, we may have to convert it to a phone-only visit.  In either situation, we cannot ensure that we have a secure connection.  Are you willing to proceed?" YES YES  4. Advise patient to be prepared -  "Two hours prior to your appointment, go ahead and check your blood pressure, pulse, oxygen saturation, and your weight (if you have the equipment to check those) and write them all down. When your visit starts, your provider will ask you for this information. If you have an Apple Watch or Kardia device, please plan to have heart rate information ready on the day of your appointment. Please have a pen and paper handy nearby the day of the visit as well."  5. Give patient instructions for MyChart download to smartphone OR Doximity/Doxy.me as below if video visit (depending on what platform provider is using)  6. Inform patient they will receive a phone call 15 minutes prior to their appointment time (may be from unknown caller ID) so they should be prepared to answer    TELEPHONE CALL NOTE  Kevin [name] has been deemed a candidate for a follow-up tele-health visit to limit community exposure during the Covid-19 pandemic. I spoke with the patient via phone to ensure availability of phone/video source, confirm preferred email & phone number, and discuss instructions and expectations.  I reminded Kevin [name] to be prepared with any vital sign and/or heart rhythm information that could potentially be obtained via home monitoring, at the time of his visit. I reminded Kevin [name] to expect a phone call prior to his visit.  Minus Liberty  10/03/2018 3:34 PM   INSTRUCTIONS FOR DOWNLOADING THE MYCHART APP TO SMARTPHONE  - The patient must first make sure to have activated MyChart and know their login information - If Apple, go to [employer] and type in MyChart in the search bar and download the app. If Android, ask patient to go to [employer] and type in [place] in the search bar and download the app. The app is free but as with any other app downloads, their phone may require them to verify saved payment information or Apple/Android password.  - The patient will need to then log into the app with  their MyChart username and password, and select  as their healthcare provider to link the account. When it is time for your visit, go to the MyChart app, find appointments, and click Begin Video Visit. Be sure to Select Allow for your device to access the Microphone and Camera for your visit. You will then be connected, and your provider will be with you shortly.  **If they have any issues connecting, or need assistance please contact MyChart service desk [phone])**  **If using a computer, in order to ensure the best quality for their visit they will need to use either of the following Internet Browsers: [employer], or Google Chrome**  IF USING DOXIMITY or DOXY.ME - The patient will receive a link just prior to their visit by text.     FULL LENGTH CONSENT FOR TELE-HEALTH VISIT   I hereby voluntarily request, consent and authorize [facility] and its employed or contracted physicians, physician assistants, nurse practitioners or other licensed health care professionals (the Practitioner), to provide me with telemedicine health care services (the Services") as deemed necessary by the treating Practitioner. I acknowledge and consent to receive the Services by the Practitioner via telemedicine. I understand that the telemedicine visit will involve communicating with the Practitioner through live audiovisual communication technology and the disclosure of certain medical information by electronic transmission. I acknowledge that I have been given the opportunity to request an in-person assessment or other available alternative prior to the telemedicine visit and am voluntarily participating in the telemedicine visit.  I understand that I have the right to withhold or withdraw my consent to the use of telemedicine in the course of my care at any time, without affecting my right to future care or treatment, and that the Practitioner or I may terminate the telemedicine  visit at any time. I understand that I have the right to inspect all information obtained and/or recorded in the course of the telemedicine visit and may receive copies of available information for a reasonable fee.  I understand that some of the potential risks of receiving the Services via telemedicine include:   Delay or interruption in medical evaluation due to technological equipment failure or disruption;  Information transmitted may not be sufficient (e.g. poor resolution of images) to allow for appropriate medical decision making by the Practitioner; and/or   In rare instances, security protocols could fail, causing a breach of personal health information.  Furthermore, I acknowledge that it is my responsibility to provide information about my medical history, conditions and care that is complete and accurate to the best of my ability. I acknowledge that Practitioner's advice, recommendations, and/or decision may be based on factors not within their control, such as incomplete or inaccurate data provided by me or distortions of diagnostic images or specimens that may result from electronic transmissions. I understand that the practice of medicine  is not an Chief Strategy Officer and that Practitioner makes no warranties or guarantees regarding treatment outcomes. I acknowledge that I will receive a copy of this consent concurrently upon execution via email to the email address I last provided but may also request a printed copy by calling the office of [facility].    I understand that my insurance will be billed for this visit.   I have read or had this consent read to me.  I understand the contents of this consent, which adequately explains the benefits and risks of the Services being provided via telemedicine.   I have been provided ample opportunity to ask questions regarding this consent and the Services and have had my questions answered to my satisfaction.  I give my informed consent for  the services to be provided through the use of telemedicine in my medical care  By participating in this telemedicine visit I agree to the above.

## 2018-10-05 ENCOUNTER — Telehealth: Payer: Self-pay | Admitting: *Deleted

## 2018-10-05 NOTE — Telephone Encounter (Signed)
Covid-19 travel screening questions  Have you traveled in the last 14 days? If yes where? No Do you now or have you had a fever in the last 14 days? No Do you have any respiratory symptoms of shortness of breath or cough now or in the last 14 days? No Do you have any family members or close contacts with diagnosed or suspected Covid-19? No      

## 2018-10-08 ENCOUNTER — Ambulatory Visit (INDEPENDENT_AMBULATORY_CARE_PROVIDER_SITE_OTHER)
Admission: RE | Admit: 2018-10-08 | Discharge: 2018-10-08 | Disposition: A | Discharge status: Home / Self Care | Payer: Medicare HMO | Source: Ambulatory Visit | Attending: Family Medicine | Admitting: Family Medicine

## 2018-10-08 ENCOUNTER — Other Ambulatory Visit: Payer: Self-pay

## 2018-10-08 DIAGNOSIS — R1032 Left lower quadrant pain: Secondary | ICD-10-CM

## 2018-10-09 ENCOUNTER — Encounter: Payer: Self-pay | Admitting: Registered Nurse

## 2018-10-09 ENCOUNTER — Encounter: Payer: Medicare HMO | Attending: Physical Medicine & Rehabilitation | Admitting: Registered Nurse

## 2018-10-09 VITALS — BP 132/74 | HR 51 | Ht 67.0 in | Wt 172.0 lb

## 2018-10-09 DIAGNOSIS — M25562 Pain in left knee: Secondary | ICD-10-CM

## 2018-10-09 DIAGNOSIS — M47816 Spondylosis without myelopathy or radiculopathy, lumbar region: Secondary | ICD-10-CM

## 2018-10-09 DIAGNOSIS — G894 Chronic pain syndrome: Secondary | ICD-10-CM | POA: Diagnosis not present

## 2018-10-09 DIAGNOSIS — G8929 Other chronic pain: Secondary | ICD-10-CM

## 2018-10-09 DIAGNOSIS — I739 Peripheral vascular disease, unspecified: Secondary | ICD-10-CM | POA: Insufficient documentation

## 2018-10-09 DIAGNOSIS — M25561 Pain in right knee: Secondary | ICD-10-CM | POA: Diagnosis not present

## 2018-10-09 DIAGNOSIS — Z5181 Encounter for therapeutic drug level monitoring: Secondary | ICD-10-CM

## 2018-10-09 DIAGNOSIS — M25552 Pain in left hip: Secondary | ICD-10-CM | POA: Diagnosis not present

## 2018-10-09 DIAGNOSIS — M1712 Unilateral primary osteoarthritis, left knee: Secondary | ICD-10-CM | POA: Insufficient documentation

## 2018-10-09 DIAGNOSIS — Z79899 Other long term (current) drug therapy: Secondary | ICD-10-CM

## 2018-10-09 DIAGNOSIS — M25551 Pain in right hip: Secondary | ICD-10-CM | POA: Insufficient documentation

## 2018-10-09 MED ORDER — OXYCODONE-ACETAMINOPHEN 7.5-325 MG PO TABS: 1.0000 | ORAL_TABLET | Freq: Three times a day (TID) | ORAL | 0 refills | Status: DC | PRN

## 2018-10-09 NOTE — Progress Notes (Signed)
Subjective:    Patient ID: Kevin [name], male    DOB: February 24, 1941, 78 y.o.   MRN: [id number]  HPI: Kevin [name] is a 78 y.o. male his appointment was changed, due to national recommendations of social distancing due to [place] 19, an audio/video telehealth visit is felt to be most appropriate for this patient at this time.  See Chart message from today for the patient's consent to telehealth from [place].     He states his pain is located in his left side ( also reports his PCP ordered Cat Scan) and lower back pain. He rates his pain 4. His current exercise regime is walking and performing light household chores.   [name] Morphine equivalent is 32.81 MME.  Last Oral Swab was Performed on 04/16/2018, it was consistent.   [name] CMA asked the Health and History Questions. This provider and [name] verified we were  speaking with the correct person using two identifiers.   Pain Inventory Average Pain 4 Pain Right Now 4 My pain is constant, sharp and aching  In the last 24 hours, has pain interfered with the following? General activity 4 Relation with others 4 Enjoyment of life 4 What TIME of day is your pain at its worst? daytime Sleep (in general) Fair  Pain is worse with: walking and some activites Pain improves with: rest and medication Relief from Meds: 6  Mobility use a cane ability to climb steps?  yes do you drive?  yes  Function retired  Neuro/Psych weakness numbness tremor tingling trouble walking spasms  Prior Studies CT/MRI  Physicians involved in your care Any changes since last visit?  no   Family History  Problem Relation Age of Onset  . Parkinsonism Father   . Dementia Father   . Cancer Father        Throat  . Colon cancer Mother        died in 60s  . Cancer Mother        Colon   Social History   Socioeconomic History  . Marital status: Married    Spouse name: Not on file  . Number of  children: Not on file  . Years of education: Not on file  . Highest education level: Not on file  Occupational History  . Occupation: retired    Comment: maintenance; electrician  Social Needs  . Financial resource strain: Not on file  . Food insecurity:    Worry: Not on file    Inability: Not on file  . Transportation needs:    Medical: Not on file    Non-medical: Not on file  Tobacco Use  . Smoking status: Former Smoker    Packs/day: 2.00    Years: 28.00    Pack years: 56.00    Types: Cigarettes    Last attempt to quit: 05/31/1987    Years since quitting: 31.3  . Smokeless tobacco: Never Used  . Tobacco comment: quit smoking "in my 11's"  Substance and Sexual Activity  . Alcohol use: Yes    Alcohol/week: 0.0 standard drinks    Comment: rare, mikes hard lemonade  . Drug use: No    Types: Marijuana    Comment: hx of-4-5 yrs ago  . Sexual activity: Not Currently  Lifestyle  . Physical activity:    Days per week: Not on file    Minutes per session: Not on file  . Stress: Not on file  Relationships  .  Social connections:    Talks on phone: Not on file    Gets together: Not on file    Attends religious service: Not on file    Active member of club or organization: Not on file    Attends meetings of clubs or organizations: Not on file    Relationship status: Not on file  Other Topics Concern  . Not on file  Social History Narrative   Married 33 years in 2015. Lived together for 12 years. 2 boys from first wife. 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       Hobbies: yardwork, [place] barn, woodwork, Orthoptist   Past Surgical History:  Procedure Laterality Date  . ABDOMINAL AORTIC ANEURYSM REPAIR  2010  . CAROTID ENDARTERECTOMY Left 09/20/2004  . CATARACT EXTRACTION, BILATERAL     2016-2017  . COLONOSCOPY    . CORONARY ARTERY BYPASS GRAFT N/A  06/27/2018   Procedure: CORONARY ARTERY BYPASS GRAFTING (CABG), ON PUMP, TIMES FOUR, USING LEFT INTERNAL MAMMARY ARTERY AND ENDOSCOPICALLY HARVESTED LEFT GREATER SAPHENOUS VEIN;  Surgeon: Melrose Nakayama, MD;  Location: [facility];  Service: Open Heart Surgery;  Laterality: N/A;  . ENDARTERECTOMY Right 10/12/2015   Procedure: RIGHT CAROTID ENDARTERECTOMY WITH PATCH ANGIOPLASTY;  Surgeon: Elam Dutch, MD;  Location: [facility];  Service: Vascular;  Laterality: Right;  . INGUINAL HERNIA REPAIR Left 02/05/2013   Procedure: HERNIA REPAIR INGUINAL ADULT;  Surgeon: Joyice Faster. Cornett, MD;  Location: [facility];  Service: General;  Laterality: Left;  . INGUINAL HERNIA REPAIR Left   . INSERTION OF MESH Left 02/05/2013   Procedure: INSERTION OF MESH;  Surgeon: Joyice Faster. Cornett, MD;  Location: [facility];  Service: General;  Laterality: Left;  . LEFT HEART CATH AND CORONARY ANGIOGRAPHY N/A 06/21/2018   Procedure: LEFT HEART CATH AND CORONARY ANGIOGRAPHY;  Surgeon: Jettie Booze, MD;  Location: [facility];  Service: Cardiovascular;  Laterality: N/A;  . SHOULDER ARTHROSCOPY WITH ROTATOR CUFF REPAIR AND SUBACROMIAL DECOMPRESSION Left 05/01/2014   Procedure: LEFT ARTHROSCOPY SHOULDER SUBACROMIAL DECOMPRESSION,DISTAL CLAVICAL RESECTION AND ROTATOR CUFF REPAIR;  Surgeon: Marin Shutter, MD;  Location: [facility];  Service: Orthopedics;  Laterality: Left;  . TEE WITHOUT CARDIOVERSION N/A 06/27/2018   Procedure: TRANSESOPHAGEAL ECHOCARDIOGRAM (TEE);  Surgeon: Melrose Nakayama, MD;  Location: [facility];  Service: Open Heart Surgery;  Laterality: N/A;  . TESTICLAR CYST EXCISION Left   . TONSILLECTOMY    . VASECTOMY     Past Medical History:  Diagnosis Date  . Arthritis    DDD- Lower Back  . Bradycardia   . CAP (community acquired pneumonia) 03/17/2015   "THAT'S WHAT THEY ARE THINKING; THEY ARE NOT SURE"  . Carotid artery occlusion   . Chest pain 06/19/2018  . Chronic lower  back pain    DDD  . CKD (chronic kidney disease)    Dr. Corliss Parish  . CKD (chronic kidney disease), stage III ([place]) 01/27/2014   GFR just above 30--> just below 30 11/10/14 refer to nephrology Creatinine 2 baseline but went up to 2.5 peak Likely RAS- lisinopril not great choice   . Diverticulosis   . Enlarged prostate    takes Flomax daily  . History of blood transfusion    "probably when they did aortic aneurysm"  . History of colon polyps  benign  . Hyperlipidemia   . Hypertension    takes Amlodipine and Zebeta daily  . Insomnia    takes Melatonin nightly  . Joint pain   . 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     . Muscle spasm    takes Zanaflex daily as needed  . Peripheral edema    takes Furosemide daily  . Pneumonia    hx of   . Rheumatic fever 1946  . Rheumatoid arthritis ([place])   . Short-term memory loss    minimal  . Shortness of breath dyspnea    with exertion but lasts very short period of time  . TIA (transient ischemic attack)    x 2;takes Plavix daily  . Urinary frequency    takes Flomax daily  . Urinary urgency    There were no vitals taken for this visit.  Opioid Risk Score:   Fall Risk Score:  `1  Depression screen PHQ 2/9  Depression screen [facility] 2/9 08/09/2018 04/16/2018 01/22/2018 10/19/2017 10/05/2017 09/25/2017 07/03/2017  Decreased Interest 0 0 0 0 0 0 0  Down, Depressed, Hopeless 0 0 0 0 0 0 0  PHQ - 2 Score 0 0 0 0 0 0 0  Some recent data might be hidden     Review of Systems  Constitutional: Negative.   HENT: Negative.   Eyes: Negative.   Respiratory: Negative.   Cardiovascular: Negative.   Gastrointestinal: Positive for diarrhea.  Endocrine: Negative.   Genitourinary: Negative.   Musculoskeletal: Positive for arthralgias, back pain and myalgias.  Skin: Negative.   Allergic/Immunologic: Negative.   Neurological: Positive for tremors, weakness and numbness.  Hematological: Negative.    Psychiatric/Behavioral: Negative.   All other systems reviewed and are negative.      Objective:   Physical Exam Vitals signs and nursing note reviewed.  Musculoskeletal:     Comments: No Physical Exam Performed: Virtual Visit  Neurological:     Mental Status: He is oriented to person, place, and time.           Assessment & Plan:  1. Lumbar Spondylosis/ Lumbar Stenosis:10/09/2018 Continue current medication regime. Refilled:Oxycodone 7.5/325 mg one tablet twice daily as needed, may take an extra tablet when pain is moderate 10 days out of the month.for #70.10/09/2018 We will continue the opioid monitoring program, this consists of regular clinic visits, examinations, urine drug screen, pill counts as well as use of [place] Controlled Substance Reporting System. 2. ChronicBilateralKnee Pain:S/P Cortisone Injection by Dr Paulla Fore on 03/14/2018, note was reviewed.Orthopedist Following Dr. Paulla Fore.10/09/2018 3. Right Hip Pain: Orthopedist Following.10/09/2018  Telephone Call  Location of patient: In His Home Location of provider: Office Established patient Time spent on call: 10 Minutes

## 2018-10-11 ENCOUNTER — Ambulatory Visit: Payer: Medicare HMO

## 2018-10-11 ENCOUNTER — Telehealth: Payer: Self-pay

## 2018-10-11 NOTE — Telephone Encounter (Signed)

## 2018-10-12 ENCOUNTER — Other Ambulatory Visit: Payer: Self-pay

## 2018-10-12 ENCOUNTER — Ambulatory Visit (INDEPENDENT_AMBULATORY_CARE_PROVIDER_SITE_OTHER): Payer: Medicare HMO | Admitting: *Deleted

## 2018-10-12 DIAGNOSIS — I4891 Unspecified atrial fibrillation: Secondary | ICD-10-CM

## 2018-10-12 DIAGNOSIS — Z5181 Encounter for therapeutic drug level monitoring: Secondary | ICD-10-CM | POA: Diagnosis not present

## 2018-10-12 LAB — POCT INR: INR: 1.9 — AB (ref 2.0–3.0)

## 2018-10-12 NOTE — Patient Instructions (Signed)
Description   Spoke with pt and instructed pt to take 2 tablets today then start taking 1 tablet daily except for 1.5 tablets every Mondays, Wednesdays, and Fridays. Recheck INR in 2 weeks. [facility] [phone] Main [phone]

## 2018-10-14 ENCOUNTER — Other Ambulatory Visit: Payer: Self-pay | Admitting: Physician Assistant

## 2018-10-15 ENCOUNTER — Encounter: Payer: Self-pay | Admitting: Family Medicine

## 2018-10-15 ENCOUNTER — Other Ambulatory Visit: Payer: Self-pay

## 2018-10-15 ENCOUNTER — Ambulatory Visit (INDEPENDENT_AMBULATORY_CARE_PROVIDER_SITE_OTHER): Payer: Medicare HMO | Admitting: Family Medicine

## 2018-10-15 ENCOUNTER — Ambulatory Visit (INDEPENDENT_AMBULATORY_CARE_PROVIDER_SITE_OTHER): Payer: Medicare HMO

## 2018-10-15 VITALS — BP 162/88 | HR 52 | Temp 98.2°F | Ht 67.0 in | Wt 175.2 lb

## 2018-10-15 DIAGNOSIS — I10 Essential (primary) hypertension: Secondary | ICD-10-CM

## 2018-10-15 DIAGNOSIS — R109 Unspecified abdominal pain: Secondary | ICD-10-CM | POA: Diagnosis not present

## 2018-10-15 DIAGNOSIS — M5134 Other intervertebral disc degeneration, thoracic region: Secondary | ICD-10-CM | POA: Diagnosis not present

## 2018-10-15 NOTE — Assessment & Plan Note (Signed)
#  hypertension S: controlled typically on Metoprolol 25 mg twice daily, Lasix 20 mg every other day Home #s have been well controlled- he states occasionally will see higher #s- he checks every few days BP Readings from Last 3 Encounters:  10/15/18 (!) 162/88  10/09/18 132/74  10/01/18 130/67  A/P: poor control today- wonder if pain is contributing (low back and abdomen are bothering him right now). He is going to keep an eye on this at home and let us know if remains elevated- hes asymptomatic other than abdominal pain- which dont think is caused by BP (though BP pain may cause bp issues)

## 2018-10-15 NOTE — Progress Notes (Signed)
Phone [phone]   Subjective:  Kevin [name] is a 78 y.o. year old very pleasant male patient who presents for/with See problem oriented charting ROS- No fever, chills, cough, shortness of breath, body aches, sore throat, or loss of taste or smell   Past Medical History-  Patient Active Problem List   Diagnosis Date Noted  . Afib ([place]) 07/06/2018    Priority: High  . 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 PAD (peripheral artery disease) ([place]) 01/08/2016    Priority: High  . Diastolic CHF ([place]) 44/31/5400    Priority: High  . CKD (chronic kidney disease), stage IV ([place]) 01/27/2014    Priority: High  . AAA (abdominal aortic aneurysm, ruptured) ([place]) 01/17/2013    Priority: High  . Carotid artery stenosis s/p L carotid endarterectomy 01/12/2012    Priority: High  . History of CVA (cerebrovascular accident) 11/17/2011    Priority: High  . Chronic pain syndrome 12/23/2009    Priority: High  . Constipation 09/19/2017    Priority: Medium  . Personal history of colonic polyps 05/15/2013    Priority: Medium  . Essential tremor 10/05/2009    Priority: Medium  . UNSPECIFIED ANEMIA 12/05/2008    Priority: Medium  . BPH (benign prostatic hyperplasia) 06/04/2007    Priority: Medium  . Fasting hyperglycemia 02/05/2007    Priority: Medium  . Hyperlipidemia 01/03/2007    Priority: Medium  . Essential hypertension 01/03/2007    Priority: Medium  . CAP (community acquired pneumonia) 03/17/2015    Priority: Low  . Family history of malignant neoplasm of gastrointestinal tract 05/15/2013    Priority: Low  . Inguinal hernia 05/01/2012    Priority: Low  . BURSITIS, HIP 12/21/2009    Priority: Low  . ACTINIC KERATOSIS, EAR, LEFT 03/09/2009    Priority: Low  . 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 Lumbar spondylosis 03/17/2017    Medications- reviewed and updated Current Outpatient Medications   Medication Sig Dispense Refill  . albuterol (VENTOLIN HFA) 108 (90 Base) MCG/ACT inhaler Inhale 2 puffs into the lungs every 6 (six) hours as needed for wheezing or shortness of breath.    Marland Kitchen amiodarone (PACERONE) 200 MG tablet Take 1 tablet (200 mg total) by mouth 2 (two) times daily. For 10 days, then decrease to 200 mg daily (Patient taking differently: Take 200 mg by mouth daily. ) 60 tablet 1  . aspirin EC 81 MG EC tablet Take 1 tablet (81 mg total) by mouth daily.    Marland Kitchen atorvastatin (LIPITOR) 20 MG tablet Take 1 tablet (20 mg total) by mouth daily. 90 tablet 1  . BIOTIN PO Take 1 tablet by mouth daily.    . Cholecalciferol (VITAMIN D-3) 1000 UNITS CAPS Take 1,000 Units by mouth daily.     . diazepam (VALIUM) 5 MG tablet TAKE 1/2 TO 1 TABLET q8 hours PRN pain sleep/muscle spasm-only if wife is around+can watch you closely when taking due to sedation risk 90 tablet 0  . fenofibrate 160 MG tablet TAKE 1 TABLET EVERY DAY 90 tablet 3  . furosemide (LASIX) 20 MG tablet Take 1 tablet (20 mg total) by mouth every other day. 45 tablet 1  . gabapentin (NEURONTIN) 300 MG capsule Take 1 capsule (300 mg total) by mouth 2 (two) times daily. 180 capsule 1  . Melatonin 10 MG TABS Take 10 mg by mouth at  bedtime.     Marland Kitchen oxyCODONE-acetaminophen (PERCOCET) 7.5-325 MG tablet Take 1 tablet by mouth every 8 (eight) hours as needed for severe pain. 70 tablet 0  . tamsulosin (FLOMAX) 0.4 MG CAPS capsule TAKE 1 CAPSULE EVERY DAY 90 capsule 3  . tiZANidine (ZANAFLEX) 4 MG tablet TAKE 1 TABLET EVERY 6 HOURS AS NEEDED FOR MUSCLE SPASM(S) 90 tablet 1  . triamcinolone cream (KENALOG) 0.1 % APPLY 1 APPLICATION 2 TIMES DAILY TOPICALLY FOR 7-10 DAYS 80 g 0  . vitamin B-12 (CYANOCOBALAMIN) 1000 MCG tablet Take 1,000 mcg by mouth daily.    Marland Kitchen warfarin (COUMADIN) 2.5 MG tablet Take as directed by [facility] Clinic 100 tablet 1  . metoprolol tartrate (LOPRESSOR) 25 MG tablet Take 1 tablet (25 mg total) by mouth 2 (two) times daily.  180 tablet 3   No current facility-administered medications for this visit.      Objective:  BP (!) 162/88   Pulse (!) 52   Temp 98.2 F (36.8 C) (Oral)   Ht 5\' 7"  (1.702 m)   Wt 175 lb 3.2 oz (79.5 kg)   SpO2 96%   BMI 27.44 kg/m j Gen: NAD, resting comfortably CV: RRR no murmurs rubs or gallops Lungs: CTAB no crackles, wheeze, rhonchi Abdomen: soft/left sided abdominal pain almost in a band from top of left abdomen to bottom- improves when he flexes his abdomainal muscles/nondistended/normal bowel sounds. No rebound or guarding.  Surgical scars from aneurysm repair noted as well as CABG Ext: no edema Skin: warm, dry    Assessment and Plan   #Left sided abdominal pain S: Patient with CT abd/pelvis last week without obvious intraabdominal pathology as cause of pain.   Previously pain was improving with BMs- now it persists even with BMs. Sometimes better and sometimes worse but most of the time has at least a moderate level pain in left side. He wonderes if it radiates from his back potentially- though did not have prior workup for thoracic spine- more lumbar issues.   At times pain does go into his groin and other times down into his leg.  A/P:Unclear cause of left sided abdominal pain- recent CT was unrevealing of abdomen and pelvis. Thoracic films show arthritis- potentially related to his back? But there were no severe issues like compression fracture.   I am concerned that this pain has been going on for over 3 months and seems to be worsening-no longer improving with bowel movement-therefore have recommended GI referral-he is due for colonoscopy but there was some question on whether he should even have a repeat due to overall health-we will refer and get their opinion.  For constipation- he has used up to 2.5 capfuls miralax at a time and moving much more regularly- advised him to move back to 1 capful or perhaps 1 every other day- continue to remain well hydrated.    Essential hypertension #hypertension S: controlled typically on Metoprolol 25 mg twice daily, Lasix 20 mg every other day Home #s have been well controlled- he states occasionally will see higher #s- he checks every few days BP Readings from Last 3 Encounters:  10/15/18 (!) 162/88  10/09/18 132/74  10/01/18 130/67  A/P: poor control today- wonder if pain is contributing (low back and abdomen are bothering him right now). He is going to keep an eye on this at home and let us know if remains elevated- hes asymptomatic other than abdominal pain- which dont think is caused by BP (though BP pain may cause  bp issues)     Future Appointments  Date Time Provider [place]  10/19/2018  2:00 PM Fay Records, MD [facility]  10/26/2018  9:30 AM [facility]  11/12/2018 10:00 AM Bayard Hugger, NP [facility]   Lab/Order associations: Left lateral abdominal pain - Plan: DG Thoracic Spine W/Swimmers, Ambulatory referral to Gastroenterology  Essential hypertension  Return precautions advised.  Garret Reddish, MD

## 2018-10-15 NOTE — Patient Instructions (Signed)
Health Maintenance Due  Topic Date Due  . COLONOSCOPY - if x-ray is stable- will refer to GI for their opinion on pain and potential colonoscopy  06/20/2018   Please stop by x-ray before you go If you do not have mychart- we will call you about results within 5 business days of [place] receiving them.  If you have mychart- we will send your results within 3 business days of [place] receiving them.  If abnormal or we want to clarify a result, we will call or mychart you to make sure you receive the message.  If you have questions or concerns or don't hear within 5-7 days, please send [place] a message or call us.

## 2018-10-17 ENCOUNTER — Telehealth (INDEPENDENT_AMBULATORY_CARE_PROVIDER_SITE_OTHER): Payer: Medicare HMO | Admitting: Gastroenterology

## 2018-10-17 ENCOUNTER — Encounter: Payer: Self-pay | Admitting: Gastroenterology

## 2018-10-17 ENCOUNTER — Other Ambulatory Visit: Payer: Self-pay

## 2018-10-17 VITALS — Ht 67.0 in | Wt 175.0 lb

## 2018-10-17 DIAGNOSIS — I713 Abdominal aortic aneurysm, ruptured, unspecified: Secondary | ICD-10-CM

## 2018-10-17 DIAGNOSIS — Z86010 Personal history of colonic polyps: Secondary | ICD-10-CM

## 2018-10-17 DIAGNOSIS — R109 Unspecified abdominal pain: Secondary | ICD-10-CM

## 2018-10-17 DIAGNOSIS — K5730 Diverticulosis of large intestine without perforation or abscess without bleeding: Secondary | ICD-10-CM

## 2018-10-17 DIAGNOSIS — K5903 Drug induced constipation: Secondary | ICD-10-CM

## 2018-10-17 DIAGNOSIS — I2510 Atherosclerotic heart disease of native coronary artery without angina pectoris: Secondary | ICD-10-CM

## 2018-10-17 MED ORDER — FUROSEMIDE 20 MG PO TABS: 20.0000 mg | ORAL_TABLET | Freq: Every day | ORAL | 1 refills | Status: DC

## 2018-10-17 NOTE — Progress Notes (Signed)
Chief Complaint: Left sided pain, constipation   Referring Provider:     Marin Olp, MD    HPI:    Due to current restrictions/limitations of in-office visits due to the COVID-19 pandemic, this scheduled clinical appointment was converted to a telehealth virtual consultation using Doximity.  -Time of medical discussion: 30 minutes -The patient did consent to this virtual visit and is aware of possible charges through their insurance for this visit.  -Names of all parties present: Kevin [name] (patient), Kevin [name] (wife), Kevin Heck, DO, [facility] (physician) -Patient location: Home -Physician location: Office  Kevin [name] is a 78 y.o. male with a history of CKD 4, CAD  S/p CABG 05/2018, PAD, CHF, AAA (s/p repair 2010), CVA in 2013, RA, chronic pain referred to the Gastroenterology Clinic for evaluation of left-sided/left flank pain and constipation. Not really abdominal pain. Pain has been present for a few months. Started as an ache, and now frank pain. Present throughout the day as 4/10, with spikes of 10/10. Can radiate to to left leg and left arm.  Evaluated with a CT, which demonstrated left-sided stool burden.  Started Miralax, which has improved constipation, but no change in pain. Has 1 BM/day with Miralax 1/day, and no straining to have BM. Pain not resolved with BM.  No hematochezia or melena.  Evaluation to date unrevealing to include essentially normal CT abdomen/pelvis (diverticulosis, cholelithiasis), thoracic spine x-ray (degenerative changes, no acute changes), CMP (aside from BUN/creatinine 29/2.65 c/w CKD hx), CBC.  INR 1.9.  Four-vessel CABG completed in 05/2018.  Follows with Dr. Harrington [name] (Cardiology).   History of lumbar spondylosis/lumbar stenosis, prescribed oxycodone, takes BID, with occasional 3rd dose. Has been taking for at least 3-4 years. Has taken stool softeners for years, and no issues with OIC.   Endoscopic history: - Colonoscopy in 2000  and 2007 demonstrated small adenomatous polyps. -Colonoscopy (05/2013, Kevin [name]): Diverticulosis  Past medical history, past surgical history, social history, family history, medications, and allergies reviewed in the chart and with patient.    Past Medical History:  Diagnosis Date  . Arthritis    DDD- Lower Back  . Bradycardia   . CAP (community acquired pneumonia) 03/17/2015   "THAT'S WHAT THEY ARE THINKING; THEY ARE NOT SURE"  . Carotid artery occlusion   . Chest pain 06/19/2018  . Chronic lower back pain    DDD  . CKD (chronic kidney disease)    Kevin [name]  . CKD (chronic kidney disease), stage III ([place]) 01/27/2014   GFR just above 30--> just below 30 11/10/14 refer to nephrology Creatinine 2 baseline but went up to 2.5 peak Likely RAS- lisinopril not great choice   . Diverticulosis   . Enlarged prostate    takes Flomax daily  . History of blood transfusion    "probably when they did aortic aneurysm"  . History of colon polyps    benign  . Hyperlipidemia   . Hypertension    takes Amlodipine and Zebeta daily  . Insomnia    takes Melatonin nightly  . Joint pain   . 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     . Muscle spasm    takes Zanaflex daily as needed  . Peripheral edema    takes Furosemide daily  . Pneumonia    hx of   . Rheumatic fever 1946  . Rheumatoid arthritis ([place])   . Short-term  memory loss    minimal  . Shortness of breath dyspnea    with exertion but lasts very short period of time  . TIA (transient ischemic attack)    x 2;takes Plavix daily  . Urinary frequency    takes Flomax daily  . Urinary urgency      Past Surgical History:  Procedure Laterality Date  . ABDOMINAL AORTIC ANEURYSM REPAIR  2010  . CAROTID ENDARTERECTOMY Left 09/20/2004  . CATARACT EXTRACTION, BILATERAL     2016-2017  . COLONOSCOPY    . CORONARY ARTERY BYPASS GRAFT N/A 06/27/2018   Procedure: CORONARY ARTERY BYPASS GRAFTING (CABG), ON  PUMP, TIMES FOUR, USING LEFT INTERNAL MAMMARY ARTERY AND ENDOSCOPICALLY HARVESTED LEFT GREATER SAPHENOUS VEIN;  Surgeon: Kevin Nakayama, MD;  Location: [place];  Service: Open Heart Surgery;  Laterality: N/A;  . ENDARTERECTOMY Right 10/12/2015   Procedure: RIGHT CAROTID ENDARTERECTOMY WITH PATCH ANGIOPLASTY;  Surgeon: Kevin Dutch, MD;  Location: [place];  Service: Vascular;  Laterality: Right;  . INGUINAL HERNIA REPAIR Left 02/05/2013   Procedure: HERNIA REPAIR INGUINAL ADULT;  Surgeon: Kevin Faster. Cornett, MD;  Location: [place];  Service: General;  Laterality: Left;  . INGUINAL HERNIA REPAIR Left   . INSERTION OF MESH Left 02/05/2013   Procedure: INSERTION OF MESH;  Surgeon: Kevin Faster. Cornett, MD;  Location: [place];  Service: General;  Laterality: Left;  . LEFT HEART CATH AND CORONARY ANGIOGRAPHY N/A 06/21/2018   Procedure: LEFT HEART CATH AND CORONARY ANGIOGRAPHY;  Surgeon: Kevin Booze, MD;  Location: [place] [facility];  Service: Cardiovascular;  Laterality: N/A;  . SHOULDER ARTHROSCOPY WITH ROTATOR CUFF REPAIR AND SUBACROMIAL DECOMPRESSION Left 05/01/2014   Procedure: LEFT ARTHROSCOPY SHOULDER SUBACROMIAL DECOMPRESSION,DISTAL CLAVICAL RESECTION AND ROTATOR CUFF REPAIR;  Surgeon: Kevin Shutter, MD;  Location: [place];  Service: Orthopedics;  Laterality: Left;  . TEE WITHOUT CARDIOVERSION N/A 06/27/2018   Procedure: TRANSESOPHAGEAL ECHOCARDIOGRAM (TEE);  Surgeon: Kevin Nakayama, MD;  Location: [place];  Service: Open Heart Surgery;  Laterality: N/A;  . TESTICLAR CYST EXCISION Left   . TONSILLECTOMY    . VASECTOMY     Family History  Problem Relation Age of Onset  . Parkinsonism Father   . Dementia Father   . Cancer Father        Throat  . Colon cancer Mother        died in 42s  . Cancer Mother        Colon   Social History   Tobacco Use  . Smoking status: Former Smoker    Packs/day: 2.00    Years: 28.00    Pack years: 56.00     Types: Cigarettes    Last attempt to quit: 05/31/1987    Years since quitting: 31.4  . Smokeless tobacco: Never Used  . Tobacco comment: quit smoking "in my 35's"  Substance Use Topics  . Alcohol use: Yes    Alcohol/week: 0.0 standard drinks    Comment: rare, mikes hard lemonade  . Drug use: No    Types: Marijuana    Comment: hx of-4-5 yrs ago   Current Outpatient Medications  Medication Sig Dispense Refill  . amiodarone (PACERONE) 200 MG tablet Take 1 tablet (200 mg total) by mouth 2 (two) times daily. For 10 days, then decrease to 200 mg daily (Patient taking differently: Take 200 mg by mouth daily. ) 60 tablet 1  . aspirin EC 81 MG EC tablet Take 1 tablet (  81 mg total) by mouth daily.    Marland Kitchen atorvastatin (LIPITOR) 20 MG tablet Take 1 tablet (20 mg total) by mouth daily. 90 tablet 1  . BIOTIN PO Take 1 tablet by mouth daily.    . Cholecalciferol (VITAMIN D-3) 1000 UNITS CAPS Take 1,000 Units by mouth daily.     . diazepam (VALIUM) 5 MG tablet TAKE 1/2 TO 1 TABLET q8 hours PRN pain sleep/muscle spasm-only if wife is around+can watch you closely when taking due to sedation risk 90 tablet 0  . fenofibrate 160 MG tablet TAKE 1 TABLET EVERY DAY 90 tablet 3  . furosemide (LASIX) 20 MG tablet Take 1 tablet (20 mg total) by mouth every other day. 45 tablet 1  . gabapentin (NEURONTIN) 300 MG capsule Take 1 capsule (300 mg total) by mouth 2 (two) times daily. 180 capsule 1  . Melatonin 10 MG TABS Take 10 mg by mouth at bedtime.     . metoprolol tartrate (LOPRESSOR) 25 MG tablet Take 1 tablet (25 mg total) by mouth 2 (two) times daily. 180 tablet 3  . oxyCODONE-acetaminophen (PERCOCET) 7.5-325 MG tablet Take 1 tablet by mouth every 8 (eight) hours as needed for severe pain. 70 tablet 0  . tamsulosin (FLOMAX) 0.4 MG CAPS capsule TAKE 1 CAPSULE EVERY DAY 90 capsule 3  . tiZANidine (ZANAFLEX) 4 MG tablet TAKE 1 TABLET EVERY 6 HOURS AS NEEDED FOR MUSCLE SPASM(S) 90 tablet 1  . triamcinolone cream  (KENALOG) 0.1 % APPLY 1 APPLICATION 2 TIMES DAILY TOPICALLY FOR 7-10 DAYS 80 g 0  . vitamin B-12 (CYANOCOBALAMIN) 1000 MCG tablet Take 1,000 mcg by mouth daily.    Marland Kitchen warfarin (COUMADIN) 2.5 MG tablet Take as directed by Coumadin Clinic 100 tablet 1  . albuterol (VENTOLIN HFA) 108 (90 Base) MCG/ACT inhaler Inhale 2 puffs into the lungs every 6 (six) hours as needed for wheezing or shortness of breath.     No current facility-administered medications for this visit.    No Known Allergies   Review of Systems: All systems reviewed and negative except where noted in HPI.     Physical Exam:    Physical exam not completed due to the nature of this telehealth communication.  Patient was otherwise alert and oriented and well communicative.   ASSESSMENT AND PLAN;   1) Left flank/back pain: Left-sided pain seems more likely to be MSK etiology than GI.  CT does demonstrate some left-sided stool burden, but otherwise no inflammatory changes or other intra-abdominal pathology noted.  No change in pain despite having regular stools with MiraLAX.  Low threshold that this is GI in etiology.  Additionally, given his CABG earlier this year, would not proceed with elective colonoscopy/endoscopic evaluation at this juncture.  To follow-up with his PCM for ongoing work-up, and can also discuss with his Pain Management physician.  2) Constipation: Mild constipation, likely related to opioids.  Seems to be well controlled with MiraLAX daily. - Resume MiraLAX as prescribed -Resume adequate daily fluid intake - No plan to start methylnaltrexone at this juncture  3) Diverticulosis -No clinical or radiographic evidence of diverticulitis  4) History of tubular adenoma: - Small tubular adenomas noted on colonoscopy in 2000 and 2007, and normal colonoscopy in 2015.  Given comorbidities, overall health, CABG earlier this year, no plan for repeat elective colonoscopy for polyp surveillance/CRC screening at this  time.  Discussed this with the patient, and he agrees.  RTC prn   Lavena Bullion, DO, FACG  10/17/2018,  10:18 AM   Kevin Olp, MD

## 2018-10-17 NOTE — Patient Instructions (Signed)
To help prevent the possible spread of infection to our patients, communities, and staff; we will be implementing the following measures:  As of now we are not allowing any visitors/family members to accompany you to any upcoming appointments with [facility]. If you have any concerns about this please contact our office to discuss prior to the appointment.   Please call our office at [phone] to schedule a follow up visit as needed.   It was a pleasure to see you today!  Vito Cirigliano, D.O.

## 2018-10-17 NOTE — Telephone Encounter (Signed)
Called pt and advised. Rx sent to [facility] for Lasix 20 mg to take daily #90/1. Future lab order placed for BMP, lab visit scheduled for 10/31/18.

## 2018-10-17 NOTE — Telephone Encounter (Signed)
Copied from [place] [id number]. Topic: General - Call Back - No Documentation >> Oct 17, 2018 11:06 AM Erick Blinks wrote: Reason for CRM: Pt called back per the request of PCP in the case that his BP was still high as of latest appt monday, pt states that it has still been high. Tried connecting with nurse but nurse was not available. Please advise. Best Contact: [phone]

## 2018-10-17 NOTE — Telephone Encounter (Signed)
Called and spoke with pt. BP readings have been as follows: 10/12/18 - 163/10 10/15/18 - 140/56, 121/75 10/16/18 - 158/82, 153/80, 177/89, 188/82, 162/81 10/17/18 - 163/95, 158/90, 122/62, 176/70, 144/59, 178/76 (after working around the house)  HR 50s-60s  Denies visual changes. Had mild HA the other day which resolved quickly.   Forwarding to Dr. [name].

## 2018-10-17 NOTE — Telephone Encounter (Signed)
Have him increase lasix to 20mg  daily (send in new rx) #90 with 1 refill. Please set up repeat bmp under hypertension in 2 weeks. Please have him inform his kidney doctor of the change. Also have him check BP 3 days before he calls in and get [place] another set of blood pressures for a few days.   HR too low to increase metoprolol CKD IV so hctz or ACE or ARB not ideal Considered hydralazine briefly, clonidine- would reconsider these after we go up on lasix Considered amlodipine but hesitant with diastolic chf listed and edema issues

## 2018-10-18 ENCOUNTER — Encounter: Payer: Self-pay | Admitting: Gastroenterology

## 2018-10-19 ENCOUNTER — Telehealth (INDEPENDENT_AMBULATORY_CARE_PROVIDER_SITE_OTHER): Payer: Medicare HMO | Admitting: Internal Medicine

## 2018-10-19 ENCOUNTER — Other Ambulatory Visit: Payer: Self-pay

## 2018-10-19 ENCOUNTER — Encounter: Payer: Self-pay | Admitting: Internal Medicine

## 2018-10-19 VITALS — BP 113/52 | HR 50 | Ht 67.5 in | Wt 175.0 lb

## 2018-10-19 DIAGNOSIS — Z951 Presence of aortocoronary bypass graft: Secondary | ICD-10-CM | POA: Diagnosis not present

## 2018-10-19 DIAGNOSIS — Z8679 Personal history of other diseases of the circulatory system: Secondary | ICD-10-CM | POA: Diagnosis not present

## 2018-10-19 DIAGNOSIS — I2510 Atherosclerotic heart disease of native coronary artery without angina pectoris: Secondary | ICD-10-CM

## 2018-10-19 DIAGNOSIS — E782 Mixed hyperlipidemia: Secondary | ICD-10-CM

## 2018-10-19 DIAGNOSIS — I10 Essential (primary) hypertension: Secondary | ICD-10-CM

## 2018-10-19 DIAGNOSIS — I480 Paroxysmal atrial fibrillation: Secondary | ICD-10-CM

## 2018-10-19 MED ORDER — AMIODARONE HCL 200 MG PO TABS: 100.0000 mg | ORAL_TABLET | Freq: Every day | ORAL | 3 refills | Status: DC

## 2018-10-19 NOTE — Patient Instructions (Addendum)
Medication Instructions:  Your physician has recommended you make the following change in your medication:  1.) decrease amiodarone to 100 mg daily  If you need a refill on your cardiac medications before your next appointment, please call your pharmacy.   Lab work: On 5/29 - cbc, tsh, cmet--come in after your [facility] Cou[facility]in appointment and have this blood work done  If you have labs (blood work) drawn today and your tests are completely normal, you will receive your results only by: Marland Kitchen MyChart Message (if you have MyChart) OR . A paper copy in the mail If you have any lab test that is abnormal or we need to change your treatment, we will call you to review the results.  Testing/Procedures: none  Follow-Up:  Office visit with Dr. Harrington Challenger in September:  We have scheduled you on 02/07/19 at 9:40 am.  Any Other Special Instructions Will Be Listed Below (If Applicable).

## 2018-10-19 NOTE — Progress Notes (Signed)
Virtual Visit via Video Note   This visit type was conducted due to national recommendations for restrictions regarding the COVID-19 Pandemic (e.g. social distancing) in an effort to limit this patient's exposure and mitigate transmission in our community.  Due to his co-morbid illnesses, this patient is at least at moderate risk for complications without adequate follow up.  This format is felt to be most appropriate for this patient at this time.  All issues noted in this document were discussed and addressed.  A limited physical exam was performed with this format.  Please refer to the patient's chart for his consent to telehealth for [facility].   Date:  10/19/2018   ID:  Kevin [name], DOB 02-Oct-1940, MRN [id number]  Patient Location: Home Provider Location: Home  PCP:  Marin Olp, MD  Cardiologist:  Dorris Carnes, MD   Evaluation Performed:  Follow-Up Visit  Chief Complaint:  F/U of CAD  History of Present Illness:    Kevin [name] is a 78 y.o. male with a history of  CAD( s/p CABG in 2020) HTN, HL, bradycardial, PVOD  (s/p AAA repair; s/p bilateral CEA), TIA x 2 on plavix, RA, DCK     The pt was admitted in Jan 2020 with CP   Cath showed:  80% pox LAD; 90% D1; 80% OM2; 100% RCA with L to r collaterals    The pt underwent CABG x 4 (LIMA to LAD; SVG to D1, SVG to OM2; SVG to PDA)   Post op had PAF.   Placed on amiodarone     The patient was seen by B Bhagat in clinic in Feb 2020  Since seen he has been doing OK   He denies CP   Breathing is OK  Denies palpitations    The patient does nothave symptoms concerning for COVID-19 infection (fever, chills, cough, or new shortness of breath).    Past Medical History:  Diagnosis Date  . Arthritis    DDD- Lower Back  . Bradycardia   . CAP (community acquired pneumonia) 03/17/2015   "THAT'S WHAT THEY ARE THINKING; THEY ARE NOT SURE"  . Carotid artery occlusion   . Chest pain 06/19/2018  . Chronic lower back pain    DDD  .  CKD (chronic kidney disease)    Dr. Corliss Parish  . CKD (chronic kidney disease), stage III ([place]) 01/27/2014   GFR just above 30--> just below 30 11/10/14 refer to nephrology Creatinine 2 baseline but went up to 2.5 peak Likely RAS- lisinopril not great choice   . Diverticulosis   . Enlarged prostate    takes Flomax daily  . History of blood transfusion    "probably when they did aortic aneurysm"  . History of colon polyps    benign  . Hyperlipidemia   . Hypertension    takes Amlodipine and Zebeta daily  . Insomnia    takes Melatonin nightly  . Joint pain   . 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     . Muscle spasm    takes Zanaflex daily as needed  . Peripheral edema    takes Furosemide daily  . Pneumonia    hx of   . Rheumatic fever 1946  . Rheumatoid arthritis ([place])   . Short-term memory loss    minimal  . Shortness of breath dyspnea    with exertion but lasts very short period of time  . TIA (transient ischemic attack)    x  2;takes Plavix daily  . Urinary frequency    takes Flomax daily  . Urinary urgency    Past Surgical History:  Procedure Laterality Date  . ABDOMINAL AORTIC ANEURYSM REPAIR  2010  . CAROTID ENDARTERECTOMY Left 09/20/2004  . CATARACT EXTRACTION, BILATERAL     2016-2017  . COLONOSCOPY    . CORONARY ARTERY BYPASS GRAFT N/A 06/27/2018   Procedure: CORONARY ARTERY BYPASS GRAFTING (CABG), ON PUMP, TIMES FOUR, USING LEFT INTERNAL MAMMARY ARTERY AND ENDOSCOPICALLY HARVESTED LEFT GREATER SAPHENOUS VEIN;  Surgeon: Melrose Nakayama, MD;  Location: [place];  Service: Open Heart Surgery;  Laterality: N/A;  . ENDARTERECTOMY Right 10/12/2015   Procedure: RIGHT CAROTID ENDARTERECTOMY WITH PATCH ANGIOPLASTY;  Surgeon: Elam Dutch, MD;  Location: [place];  Service: Vascular;  Laterality: Right;  . INGUINAL HERNIA REPAIR Left 02/05/2013   Procedure: HERNIA REPAIR INGUINAL ADULT;  Surgeon: Joyice Faster. Cornett, MD;  Location: [place];  Service: General;  Laterality: Left;  . INGUINAL HERNIA REPAIR Left   . INSERTION OF MESH Left 02/05/2013   Procedure: INSERTION OF MESH;  Surgeon: Joyice Faster. Cornett, MD;  Location: [place];  Service: General;  Laterality: Left;  . LEFT HEART CATH AND CORONARY ANGIOGRAPHY N/A 06/21/2018   Procedure: LEFT HEART CATH AND CORONARY ANGIOGRAPHY;  Surgeon: Jettie Booze, MD;  Location: [place] [facility];  Service: Cardiovascular;  Laterality: N/A;  . SHOULDER ARTHROSCOPY WITH ROTATOR CUFF REPAIR AND SUBACROMIAL DECOMPRESSION Left 05/01/2014   Procedure: LEFT ARTHROSCOPY SHOULDER SUBACROMIAL DECOMPRESSION,DISTAL CLAVICAL RESECTION AND ROTATOR CUFF REPAIR;  Surgeon: Marin Shutter, MD;  Location: [place];  Service: Orthopedics;  Laterality: Left;  . TEE WITHOUT CARDIOVERSION N/A 06/27/2018   Procedure: TRANSESOPHAGEAL ECHOCARDIOGRAM (TEE);  Surgeon: Melrose Nakayama, MD;  Location: [place];  Service: Open Heart Surgery;  Laterality: N/A;  . TESTICLAR CYST EXCISION Left   . TONSILLECTOMY    . VASECTOMY       Current Meds  Medication Sig  . amiodarone (PACERONE) 200 MG tablet Take 1 tablet (200 mg total) by mouth 2 (two) times daily. For 10 days, then decrease to 200 mg daily (Patient taking differently: Take 200 mg by mouth daily. )  . aspirin EC 81 MG EC tablet Take 1 tablet (81 mg total) by mouth daily.  Marland Kitchen atorvastatin (LIPITOR) 20 MG tablet Take 1 tablet (20 mg total) by mouth daily.  Marland Kitchen BIOTIN PO Take 1 tablet by mouth daily.  . Cholecalciferol (VITAMIN D-3) 1000 UNITS CAPS Take 1,000 Units by mouth daily.   . diazepam (VALIUM) 5 MG tablet TAKE 1/2 TO 1 TABLET q8 hours PRN pain sleep/muscle spasm-only if wife is around+can watch you closely when taking due to sedation risk  . fenofibrate 160 MG tablet TAKE 1 TABLET EVERY DAY  . furosemide (LASIX) 20 MG tablet Take 1 tablet (20 mg total) by mouth daily.  Marland Kitchen gabapentin (NEURONTIN) 300 MG capsule Take 1 capsule  (300 mg total) by mouth 2 (two) times daily.  . Melatonin 10 MG TABS Take 10 mg by mouth at bedtime.   . metoprolol tartrate (LOPRESSOR) 25 MG tablet Take 1 tablet (25 mg total) by mouth 2 (two) times daily. (Patient taking differently: Take 12.5 mg by mouth 2 (two) times daily. )  . oxyCODONE-acetaminophen (PERCOCET) 7.5-325 MG tablet Take 1 tablet by mouth every 8 (eight) hours as needed for severe pain.  . tamsulosin (FLOMAX) 0.4 MG CAPS capsule TAKE 1 CAPSULE EVERY DAY  .  tiZANidine (ZANAFLEX) 4 MG tablet TAKE 1 TABLET EVERY 6 HOURS AS NEEDED FOR MUSCLE SPASM(S)  . vitamin B-12 (CYANOCOBALAMIN) 1000 MCG tablet Take 1,000 mcg by mouth daily.  Marland Kitchen warfarin (COUMADIN) 2.5 MG tablet Take as directed by Coumadin Clinic     Allergies:   Patient has no known allergies.   Social History   Tobacco Use  . Smoking status: Former Smoker    Packs/day: 2.00    Years: 28.00    Pack years: 56.00    Types: Cigarettes    Last attempt to quit: 05/31/1987    Years since quitting: 31.4  . Smokeless tobacco: Never Used  . Tobacco comment: quit smoking "in my 16's"  Substance Use Topics  . Alcohol use: Yes    Alcohol/week: 0.0 standard drinks    Comment: rare, mikes hard lemonade  . Drug use: No    Types: Marijuana    Comment: hx of-4-5 yrs ago     Family Hx: The patient's family history includes Cancer in his father and mother; Colon cancer in his mother; Dementia in his father; Parkinsonism in his father.  ROS:   Please see the history of present illness.     All other systems reviewed and are negative.   Prior CV studies:   The following studies were reviewed today:    Labs/Other Tests and Data Reviewed:    EKG:  No ECG reviewed.  Recent Labs: 07/02/2018: Magnesium 2.1 10/02/2018: ALT 21; BUN 29; Creatinine, Ser 2.65; Hemoglobin 12.0; Platelets 244.0; Potassium 4.5; Sodium 136   Recent Lipid Panel Lab Results  Component Value Date/Time   CHOL 113 06/20/2018 05:19 AM   TRIG 177 (H)  06/20/2018 05:19 AM   HDL 23 (L) 06/20/2018 05:19 AM   CHOLHDL 4.9 06/20/2018 05:19 AM   LDLCALC 55 06/20/2018 05:19 AM   LDLDIRECT 134.9 12/26/2012 12:01 PM    Wt Readings from Last 3 Encounters:  10/19/18 175 lb (79.4 kg)  10/17/18 175 lb (79.4 kg)  10/15/18 175 lb 3.2 oz (79.5 kg)     Objective:    Vital Signs:  BP (!) 113/52   Pulse (!) 50   Ht 5' 7.5" (1.715 m)   Wt 175 lb (79.4 kg)   BMI 27.00 kg/m    VITAL SIGNS:  reviewed  ASSESSMENT & PLAN:    1. CAD   Pt now about 5 mnths post CABG   Doing good     2  Hx atrial fibrillation   All in post op setting   On 200 amio still  Reviewed with EP   Would stop amiodarone   Continue coumadin for now  Reassess in a couple months   May d/c later this summer     3  HL   Will need lipids Check when he comes in for INR  4  CKD  Check BMET for Cr    COVID-19 Education: The signs and symptoms of COVID-19 were discussed with the patient and how to seek care for testing (follow up with PCP or arrange E-visit).  The importance of social distancing was discussed today.  Time:   Today, I have spent 25 minutes with the patient with telehealth technology discussing the above problems.     Medication Adjustments/Labs and Tests Ordered: Current medicines are reviewed at length with the patient today.  Concerns regarding medicines are outlined above.   Tests Ordered: No orders of the defined types were placed in this encounter.   Medication Changes: No  orders of the defined types were placed in this encounter.   Disposition:  Follow up F/U early September   Signed, Gunhild Bautch, MD  10/19/2018 2:20 PM    [place]

## 2018-10-23 ENCOUNTER — Other Ambulatory Visit: Payer: Self-pay | Admitting: Internal Medicine

## 2018-10-23 ENCOUNTER — Other Ambulatory Visit: Payer: Self-pay | Admitting: Physical Therapy

## 2018-10-23 ENCOUNTER — Telehealth: Payer: Self-pay | Admitting: Internal Medicine

## 2018-10-23 ENCOUNTER — Telehealth: Payer: Self-pay | Admitting: Family Medicine

## 2018-10-23 DIAGNOSIS — R109 Unspecified abdominal pain: Secondary | ICD-10-CM

## 2018-10-23 MED ORDER — AMIODARONE HCL 200 MG PO TABS: 100.0000 mg | ORAL_TABLET | Freq: Every day | ORAL | 3 refills | Status: DC

## 2018-10-23 NOTE — Telephone Encounter (Signed)
Kevin [name] - great idea- you may place referral

## 2018-10-23 NOTE — Telephone Encounter (Signed)
Copied from [place] [phone] [phone]. Topic: General - Other >> Oct 23, 2018 10:49 AM Oneta Rack wrote: Relation to pt: self  Call back number: [phone] (Preferred)   Reason for call:  Patient left side below ribs above hip,  pain has not improved seeking clinical advice. Patient had a telemedicine visit 10/15/2018 with PCP

## 2018-10-23 NOTE — Telephone Encounter (Signed)
Referral placed for pt to see Dr. Tamala Julian and pt called and advised.  Pt amenable to this option and scheduled with Dr. Tamala Julian for 10/30/18 at 10:15 am.

## 2018-10-23 NOTE — Telephone Encounter (Signed)
And let patient know

## 2018-10-23 NOTE — Telephone Encounter (Signed)
Reviewed with EP    Would recomm stopping amiodarone   Set for phone visit later this summer (July) Keep on coumadin for now

## 2018-10-24 NOTE — Addendum Note (Signed)
Addended by: Rodman Key on: 10/24/2018 12:02 PM   Modules accepted: Orders

## 2018-10-24 NOTE — Telephone Encounter (Signed)
Called patient. Informed to stop amiodarone Adv that we just refilled to his mail order pharmacy.  He will not take and disregard when it comes in. Earlier virtual follow up scheduled for July for phone visit.  Consent already in chart.

## 2018-10-25 ENCOUNTER — Other Ambulatory Visit: Payer: Self-pay

## 2018-10-25 ENCOUNTER — Telehealth: Payer: Self-pay

## 2018-10-25 ENCOUNTER — Telehealth: Payer: Self-pay | Admitting: Family Medicine

## 2018-10-25 MED ORDER — FUROSEMIDE 20 MG PO TABS: 20.0000 mg | ORAL_TABLET | Freq: Every day | ORAL | 1 refills | Status: DC

## 2018-10-25 NOTE — Telephone Encounter (Signed)
1. COVID-19 Pre-Screening Questions:  . In the past 7 to 10 days have you had a cough,  shortness of breath, headache, congestion, fever (100 or greater) body aches, chills, sore throat, or sudden loss of taste or sense of smell?no . Have you been around anyone with known Covid 19.no . Have you been around anyone who is awaiting Covid 19 test results in the past 7 to 10 days?no . Have you been around anyone who has been exposed to Covid 19, or has mentioned symptoms of Covid 19 within the past 7 to 10 days?no  2. Pt advised of visitor restrictions (no visitors allowed except if needed to conduct the visit). Also advised to arrive at appointment time and wear a mask.  Yes

## 2018-10-25 NOTE — Telephone Encounter (Signed)
MEDICATION: Furosemide 20 mg  PHARMACY:  [facility]  IS THIS A 90 DAY SUPPLY : yes  IS PATIENT OUT OF MEDICATION: no  IF NOT; HOW MUCH IS LEFT: 90 days/ last filled on 10/17/18 and sent to [facility]: @5 //18/20   NEXT APPOINTMENT DATE:  OTHER COMMENTS: needs new rx sent to [facility] for mail order   **Let patient know to contact pharmacy at the end of the day to make sure medication is ready. **  ** Please notify patient to allow 48-72 hours to process**  **Encourage patient to contact the pharmacy for refills or they can request refills through [facility]**

## 2018-10-25 NOTE — Telephone Encounter (Signed)
Rx sent to pharmacy   

## 2018-10-26 ENCOUNTER — Ambulatory Visit (INDEPENDENT_AMBULATORY_CARE_PROVIDER_SITE_OTHER): Payer: Medicare HMO | Admitting: *Deleted

## 2018-10-26 ENCOUNTER — Other Ambulatory Visit: Payer: Medicare HMO | Admitting: *Deleted

## 2018-10-26 ENCOUNTER — Other Ambulatory Visit: Payer: Self-pay

## 2018-10-26 DIAGNOSIS — I4891 Unspecified atrial fibrillation: Secondary | ICD-10-CM

## 2018-10-26 DIAGNOSIS — I10 Essential (primary) hypertension: Secondary | ICD-10-CM | POA: Diagnosis not present

## 2018-10-26 DIAGNOSIS — I2510 Atherosclerotic heart disease of native coronary artery without angina pectoris: Secondary | ICD-10-CM

## 2018-10-26 DIAGNOSIS — I480 Paroxysmal atrial fibrillation: Secondary | ICD-10-CM | POA: Diagnosis not present

## 2018-10-26 DIAGNOSIS — Z5181 Encounter for therapeutic drug level monitoring: Secondary | ICD-10-CM

## 2018-10-26 DIAGNOSIS — E782 Mixed hyperlipidemia: Secondary | ICD-10-CM | POA: Diagnosis not present

## 2018-10-26 LAB — COMPREHENSIVE METABOLIC PANEL
ALT: 16 IU/L (ref 0–44)
AST: 26 IU/L (ref 0–40)
Albumin/Globulin Ratio: 1.7 (ref 1.2–2.2)
Albumin: 4.1 g/dL (ref 3.7–4.7)
Alkaline Phosphatase: 35 IU/L — ABNORMAL LOW (ref 39–117)
BUN/Creatinine Ratio: 11 (ref 10–24)
BUN: 30 mg/dL — ABNORMAL HIGH (ref 8–27)
Bilirubin Total: 0.5 mg/dL (ref 0.0–1.2)
CO2: 22 mmol/L (ref 20–29)
Calcium: 8.9 mg/dL (ref 8.6–10.2)
Chloride: 99 mmol/L (ref 96–106)
Creatinine, Ser: 2.72 mg/dL — ABNORMAL HIGH (ref 0.76–1.27)
GFR calc Af Amer: 25 mL/min/{1.73_m2} — ABNORMAL LOW (ref >59)
GFR calc non Af Amer: 21 mL/min/{1.73_m2} — ABNORMAL LOW (ref >59)
Globulin, Total: 2.4 g/dL (ref 1.5–4.5)
Glucose: 123 mg/dL — ABNORMAL HIGH (ref 65–99)
Potassium: 4 mmol/L (ref 3.5–5.2)
Sodium: 137 mmol/L (ref 134–144)
Total Protein: 6.5 g/dL (ref 6.0–8.5)

## 2018-10-26 LAB — CBC
Hematocrit: 33.4 % — ABNORMAL LOW (ref 37.5–51.0)
Hemoglobin: 11.2 g/dL — ABNORMAL LOW (ref 13.0–17.7)
MCH: 29 pg (ref 26.6–33.0)
MCHC: 33.5 g/dL (ref 31.5–35.7)
MCV: 87 fL (ref 79–97)
Platelets: 274 10*3/uL (ref 150–450)
RBC: 3.86 x10E6/uL — ABNORMAL LOW (ref 4.14–5.80)
RDW: 15 % (ref 11.6–15.4)
WBC: 8.7 10*3/uL (ref 3.4–10.8)

## 2018-10-26 LAB — TSH: TSH: 0.794 u[IU]/mL (ref 0.450–4.500)

## 2018-10-26 LAB — POCT INR: INR: 2.4 (ref 2.0–3.0)

## 2018-10-26 NOTE — Patient Instructions (Signed)
Description   Spoke wife and instructed pt to continue taking 1 tablet daily except for 1.5 tablets every Mondays, Wednesdays, and Fridays. Recheck INR in 3 weeks. [facility] [phone] Main [phone]

## 2018-10-30 ENCOUNTER — Ambulatory Visit (INDEPENDENT_AMBULATORY_CARE_PROVIDER_SITE_OTHER)
Admission: RE | Admit: 2018-10-30 | Discharge: 2018-10-30 | Disposition: A | Discharge status: Home / Self Care | Payer: Medicare HMO | Source: Ambulatory Visit | Attending: Family Medicine | Admitting: Family Medicine

## 2018-10-30 ENCOUNTER — Other Ambulatory Visit (INDEPENDENT_AMBULATORY_CARE_PROVIDER_SITE_OTHER): Payer: Medicare HMO

## 2018-10-30 ENCOUNTER — Other Ambulatory Visit: Payer: Self-pay

## 2018-10-30 ENCOUNTER — Encounter: Payer: Self-pay | Admitting: Family Medicine

## 2018-10-30 ENCOUNTER — Telehealth: Payer: Self-pay | Admitting: Family Medicine

## 2018-10-30 ENCOUNTER — Ambulatory Visit (INDEPENDENT_AMBULATORY_CARE_PROVIDER_SITE_OTHER): Payer: Medicare HMO | Admitting: Family Medicine

## 2018-10-30 VITALS — BP 110/76 | HR 86 | Ht 67.5 in

## 2018-10-30 DIAGNOSIS — K5903 Drug induced constipation: Secondary | ICD-10-CM

## 2018-10-30 DIAGNOSIS — M545 Low back pain, unspecified: Secondary | ICD-10-CM

## 2018-10-30 DIAGNOSIS — M47816 Spondylosis without myelopathy or radiculopathy, lumbar region: Secondary | ICD-10-CM | POA: Diagnosis not present

## 2018-10-30 DIAGNOSIS — M2550 Pain in unspecified joint: Secondary | ICD-10-CM

## 2018-10-30 DIAGNOSIS — J9811 Atelectasis: Secondary | ICD-10-CM | POA: Diagnosis not present

## 2018-10-30 LAB — FERRITIN: Ferritin: 250.2 ng/mL (ref 22.0–322.0)

## 2018-10-30 LAB — IBC PANEL
Iron: 70 ug/dL (ref 42–165)
Saturation Ratios: 17.5 % — ABNORMAL LOW (ref 20.0–50.0)
Transferrin: 285 mg/dL (ref 212.0–360.0)

## 2018-10-30 LAB — VITAMIN D 25 HYDROXY (VIT D DEFICIENCY, FRACTURES): VITD: 81.58 ng/mL (ref 30.00–100.00)

## 2018-10-30 LAB — URIC ACID: Uric Acid, Serum: 8.7 mg/dL — ABNORMAL HIGH (ref 4.0–7.8)

## 2018-10-30 NOTE — Patient Instructions (Signed)
Ventalin 1 inhale daily Linzess 1 pill every other day See me in 4-6 weeks

## 2018-10-30 NOTE — Assessment & Plan Note (Signed)
Patient does have spondylosis.  I do think that this can contribute to some of his pain.  Discussed with him in great length.  Patient is on gabapentin 300 mg twice daily already for a nerve modulator and is taking pain medications.  Due to patient's other comorbidities he is unable to do any type of anti-inflammatory.  I believe though that patient has some residual inflammation and potentially scarring of the left lower lobe of the lung that is likely contributing to more of the left flank pain.  Short course of albuterol given and warned of potential side effects.  Blood pressure is well controlled at the moment.  I do believe that some constipation could be playing a role as well.  Patient is on chronic narcotics.  Patient given some samples of linzess to try but only to take every other day 150 mg.  I would not like to do this long-term but I would like to see if this would help some of his bowel regimen.  Will be significantly difficult secondary to patient's other comorbidities involved follow-up again in 3 to 4 weeks

## 2018-10-30 NOTE — Telephone Encounter (Signed)
Copied from [place] [id number]. Topic: General - Inquiry >> Oct 30, 2018 11:09 AM Virl Axe D wrote: Reason for CRM: Pt stated he is supposed to use Ventalin but he wasn't sure if Dr. Tamala Julian would be sending in a rx or if he needed to pick it up OTC. Would like a call back. Please advise. Cb#[phone] Okay to leave vm on home phone as well

## 2018-10-30 NOTE — Progress Notes (Signed)
[name] [address], [place] [address] Phone: [phone] Subjective:   [name], am serving as a scribe for Dr. Hulan Saas.   I'm seeing this patient by the request  of:    CC: side and back pain   [id number]  Kevin [name] is a 78 y.o. male coming in with complaint of left hip pain that started anteriorly but is radiating into his back. Did fall off a stool 3 days ago. Pain is 5/10. Has been seen for left flank pain. Pain was constant and dull but could become sharp. Was told he may be constipated but did not have relief once he used the bathroom. Uses oxycodone for pain.  Patient states that if he is unable to takes his oxycodone he is unable to function.  Recently though has fallen because he did feel unsteady on his feet.  Patient has had significant amount of imaging done.  Most recently did have some thoracic spine x-rays done.  These were independently visualized by me.  X-ray showed multilevel degenerative changes as well as at the upper lumbar spine.  Patient did have atelectasis in the left lower lobe.  Patient did have a heart bypass done earlier this year.  Patient has had a persistent left pleural effusion with left hemidiaphragm elevation and basilar atelectasis.  The atelectasis continue to be there even on most recent imaging including lumbar spine x-rays today.  Past Medical History:  Diagnosis Date  . Arthritis    DDD- Lower Back  . Bradycardia   . CAP (community acquired pneumonia) 03/17/2015   "THAT'S WHAT THEY ARE THINKING; THEY ARE NOT SURE"  . Carotid artery occlusion   . Chest pain 06/19/2018  . Chronic lower back pain    DDD  . CKD (chronic kidney disease)    Dr. Corliss Parish  . 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  Diverticulosis   . Enlarged prostate    takes Flomax daily  . History of blood transfusion    "probably when they did aortic aneurysm"  . History of colon polyps    benign  . Hyperlipidemia   . Hypertension    takes Amlodipine and Zebeta daily  . Insomnia    takes Melatonin nightly  . Joint pain   . 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     . Muscle spasm    takes Zanaflex daily as needed  . Peripheral edema    takes Furosemide daily  . Pneumonia    hx of   . Rheumatic fever 1946  . Rheumatoid arthritis ([place])   . Short-term memory loss    minimal  . Shortness of breath dyspnea    with exertion but lasts very short period of time  . TIA (transient ischemic attack)    x 2;takes Plavix daily  . Urinary frequency    takes Flomax daily  . Urinary urgency    Past Surgical History:  Procedure Laterality Date  . ABDOMINAL AORTIC ANEURYSM REPAIR  2010  . CAROTID ENDARTERECTOMY Left 09/20/2004  . CATARACT EXTRACTION, BILATERAL     2016-2017  . COLONOSCOPY    . CORONARY ARTERY BYPASS GRAFT N/A 06/27/2018   Procedure: CORONARY ARTERY BYPASS GRAFTING (CABG), ON PUMP, TIMES FOUR, USING LEFT  INTERNAL MAMMARY ARTERY AND ENDOSCOPICALLY HARVESTED LEFT GREATER SAPHENOUS VEIN;  Surgeon: Melrose Nakayama, MD;  Location: [facility];  Service: Open Heart Surgery;  Laterality: N/A;  . ENDARTERECTOMY Right 10/12/2015   Procedure: RIGHT CAROTID ENDARTERECTOMY WITH PATCH ANGIOPLASTY;  Surgeon: Elam Dutch, MD;  Location: [facility];  Service: Vascular;  Laterality: Right;  . INGUINAL HERNIA REPAIR Left 02/05/2013   Procedure: HERNIA REPAIR INGUINAL ADULT;  Surgeon: Joyice Faster. Cornett, MD;  Location: [facility];  Service: General;  Laterality: Left;  . INGUINAL HERNIA REPAIR Left   . INSERTION OF MESH Left 02/05/2013   Procedure: INSERTION OF MESH;  Surgeon: Joyice Faster. Cornett, MD;  Location: [facility];  Service: General;  Laterality: Left;  . LEFT  HEART CATH AND CORONARY ANGIOGRAPHY N/A 06/21/2018   Procedure: LEFT HEART CATH AND CORONARY ANGIOGRAPHY;  Surgeon: Jettie Booze, MD;  Location: [facility];  Service: Cardiovascular;  Laterality: N/A;  . SHOULDER ARTHROSCOPY WITH ROTATOR CUFF REPAIR AND SUBACROMIAL DECOMPRESSION Left 05/01/2014   Procedure: LEFT ARTHROSCOPY SHOULDER SUBACROMIAL DECOMPRESSION,DISTAL CLAVICAL RESECTION AND ROTATOR CUFF REPAIR;  Surgeon: Marin Shutter, MD;  Location: [facility];  Service: Orthopedics;  Laterality: Left;  . TEE WITHOUT CARDIOVERSION N/A 06/27/2018   Procedure: TRANSESOPHAGEAL ECHOCARDIOGRAM (TEE);  Surgeon: Melrose Nakayama, MD;  Location: [facility];  Service: Open Heart Surgery;  Laterality: N/A;  . TESTICLAR CYST EXCISION Left   . TONSILLECTOMY    . VASECTOMY     Social History   Socioeconomic History  . Marital status: Married    Spouse name: Not on file  . Number of children: Not on file  . Years of education: Not on file  . Highest education level: Not on file  Occupational History  . Occupation: retired    Comment: maintenance; electrician  Social Needs  . Financial resource strain: Not on file  . Food insecurity:    Worry: Not on file    Inability: Not on file  . Transportation needs:    Medical: Not on file    Non-medical: Not on file  Tobacco Use  . Smoking status: Former Smoker    Packs/day: 2.00    Years: 28.00    Pack years: 56.00    Types: Cigarettes    Last attempt to quit: 05/31/1987    Years since quitting: 31.4  . Smokeless tobacco: Never Used  . Tobacco comment: quit smoking "in my 27's"  Substance and Sexual Activity  . Alcohol use: Yes    Alcohol/week: 0.0 standard drinks    Comment: rare, mikes hard lemonade  . Drug use: No    Types: Marijuana    Comment: hx of-4-5 yrs ago  . Sexual activity: Not Currently  Lifestyle  . Physical activity:    Days per week: Not on file    Minutes per session: Not on file  . Stress: Not on file  Relationships   . Social connections:    Talks on phone: Not on file    Gets together: Not on file    Attends religious service: Not on file    Active member of club or organization: Not on file    Attends meetings of clubs or organizations: Not on file    Relationship status: Not on file  Other Topics Concern  . Not on file  Social History Narrative   Married 33 years in 2015. Lived together for 12 years. 2 boys from first wife. 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       Hobbies: yardwork, [place] barn, woodwork, collect knive   No Known Allergies Family History  Problem Relation Age of Onset  . Parkinsonism Father   . Dementia Father   . Cancer Father        Throat  . Colon cancer Mother        died in 39s  . Cancer Mother        Colon     Current Outpatient Medications (Cardiovascular):  .  atorvastatin (LIPITOR) 20 MG tablet, Take 1 tablet (20 mg total) by mouth daily. .  fenofibrate 160 MG tablet, TAKE 1 TABLET EVERY DAY .  furosemide (LASIX) 20 MG tablet, Take 1 tablet (20 mg total) by mouth daily. .  metoprolol tartrate (LOPRESSOR) 25 MG tablet, Take 1 tablet (25 mg total) by mouth 2 (two) times daily. (Patient taking differently: Take 12.5 mg by mouth 2 (two) times daily. )   Current Outpatient Medications (Analgesics):  .  aspirin EC 81 MG EC tablet, Take 1 tablet (81 mg total) by mouth daily. Marland Kitchen  oxyCODONE-acetaminophen (PERCOCET) 7.5-325 MG tablet, Take 1 tablet by mouth every 8 (eight) hours as needed for severe pain.  Current Outpatient Medications (Hematological):  .  vitamin B-12 (CYANOCOBALAMIN) 1000 MCG tablet, Take 1,000 mcg by mouth daily. Marland Kitchen  warfarin (COUMADIN) 2.5 MG tablet, Take as directed by Coumadin Clinic  Current Outpatient Medications (Other):  .  BIOTIN PO, Take 1 tablet by mouth daily. .  Cholecalciferol (VITAMIN D-3) 1000 UNITS CAPS,  Take 1,000 Units by mouth daily.  .  diazepam (VALIUM) 5 MG tablet, TAKE 1/2 TO 1 TABLET q8 hours PRN pain sleep/muscle spasm-only if wife is around+can watch you closely when taking due to sedation risk .  gabapentin (NEURONTIN) 300 MG capsule, Take 1 capsule (300 mg total) by mouth 2 (two) times daily. .  Melatonin 10 MG TABS, Take 10 mg by mouth at bedtime.  .  tamsulosin (FLOMAX) 0.4 MG CAPS capsule, TAKE 1 CAPSULE EVERY DAY .  tiZANidine (ZANAFLEX) 4 MG tablet, TAKE 1 TABLET EVERY 6 HOURS AS NEEDED FOR MUSCLE SPASM(S)    Past medical history, social, surgical and family history all reviewed in electronic medical record.  No pertanent information unless stated regarding to the chief complaint.   Review of Systems:  No headache, visual changes, nausea, vomiting, diarrhea, constipation, dizziness, abdominal pain, skin rash, fevers, chills, night sweats, weight loss, swollen lymph nodes,  chest pain, shortness of breath, mood changes.  Positive muscle aches and body aches  Objective  Blood pressure 110/76, pulse 86, height 5' 7.5" (1.715 m), SpO2 (!) 89 %.     General: No apparent distress alert and oriented x3 mood and affect normal, dressed appropriately.  HEENT: Pupils equal, extraocular movements intact  Respiratory: Patient's speak in full sentences and does not appear short of breath  Cardiovascular: Trace lower extremity edema, non tender, no erythema  Skin: Warm dry intact with no signs of infection or rash on extremities or on axial skeleton.  Multiple bruises on patient's arm from falling Abdomen: Soft nontender  Neuro: Cranial nerves II through XII are intact, neurovascularly intact in all extremities with 2+ DTRs and 2+ pulses.  Lymph: No lymphadenopathy of posterior or anterior cervical chain or axillae bilaterally.  Gait antalgic walking with the aid  of a cane MSK:  tender with limited range of motion and does have fairly significant axial muscle weakness.  Tremor noted of  the left upper extremity Patient on exam does have some mild nonspecific tenderness to palpation noted at the costal margin on the left side.  Tightness of the musculature of the lumbar spine on the left paraspinal musculature.  Mild spinous process tenderness.  Weakness with 4 out of 5 strength in the lower extremities bilaterally    Impression and Recommendations:     This case required medical decision making of moderate complexity. The above documentation has been reviewed and is accurate and complete Lyndal Pulley, DO       Note: This dictation was prepared with Dragon dictation along with smaller phrase technology. Any transcriptional errors that result from this process are unintentional.

## 2018-10-31 ENCOUNTER — Other Ambulatory Visit (INDEPENDENT_AMBULATORY_CARE_PROVIDER_SITE_OTHER): Payer: Medicare HMO

## 2018-10-31 DIAGNOSIS — I10 Essential (primary) hypertension: Secondary | ICD-10-CM | POA: Diagnosis not present

## 2018-10-31 LAB — BASIC METABOLIC PANEL
BUN: 33 mg/dL — ABNORMAL HIGH (ref 6–23)
CO2: 27 mEq/L (ref 19–32)
Calcium: 8.4 mg/dL (ref 8.4–10.5)
Chloride: 103 mEq/L (ref 96–112)
Creatinine, Ser: 2.79 mg/dL — ABNORMAL HIGH (ref 0.40–1.50)
GFR: 22.01 mL/min — ABNORMAL LOW (ref >60.00)
Glucose, Bld: 98 mg/dL (ref 70–99)
Potassium: 4.3 mEq/L (ref 3.5–5.1)
Sodium: 137 mEq/L (ref 135–145)

## 2018-10-31 MED ORDER — ALBUTEROL SULFATE HFA 108 (90 BASE) MCG/ACT IN AERS: 2.0000 | INHALATION_SPRAY | Freq: Two times a day (BID) | RESPIRATORY_TRACT | 6 refills | Status: DC | PRN

## 2018-10-31 NOTE — Addendum Note (Signed)
Addended by: Francis Dowse T on: 10/31/2018 08:11 AM   Modules accepted: Orders

## 2018-10-31 NOTE — Telephone Encounter (Signed)
Sorry sent in prescription

## 2018-10-31 NOTE — Addendum Note (Signed)
Addended by: Francis Dowse T on: 10/31/2018 08:10 AM   Modules accepted: Orders

## 2018-11-01 NOTE — Telephone Encounter (Signed)
Spoke to pt, he did pick up the rx from the pharmacy.

## 2018-11-08 ENCOUNTER — Telehealth (HOSPITAL_COMMUNITY): Payer: Self-pay

## 2018-11-08 NOTE — Telephone Encounter (Signed)
Phone pt regarding [facility]. Pt would like to wait until department opens back up for in person visits. Pt is hopeful issues with back will be better by then. Will follow up with pt once department opens back up.   Carma Lair MS, ACSM CEP 12:43 PM 11/08/2018

## 2018-11-12 ENCOUNTER — Other Ambulatory Visit: Payer: Self-pay

## 2018-11-12 ENCOUNTER — Telehealth: Payer: Self-pay

## 2018-11-12 ENCOUNTER — Encounter: Payer: Medicare HMO | Attending: Physical Medicine & Rehabilitation | Admitting: Registered Nurse

## 2018-11-12 ENCOUNTER — Encounter: Payer: Self-pay | Admitting: Registered Nurse

## 2018-11-12 ENCOUNTER — Other Ambulatory Visit: Payer: Self-pay | Admitting: Registered Nurse

## 2018-11-12 VITALS — BP 153/69 | HR 64 | Temp 98.1°F | Resp 12 | Ht 67.5 in | Wt 180.0 lb

## 2018-11-12 DIAGNOSIS — Z5181 Encounter for therapeutic drug level monitoring: Secondary | ICD-10-CM

## 2018-11-12 DIAGNOSIS — M25551 Pain in right hip: Secondary | ICD-10-CM | POA: Diagnosis not present

## 2018-11-12 DIAGNOSIS — I739 Peripheral vascular disease, unspecified: Secondary | ICD-10-CM | POA: Insufficient documentation

## 2018-11-12 DIAGNOSIS — M1712 Unilateral primary osteoarthritis, left knee: Secondary | ICD-10-CM | POA: Insufficient documentation

## 2018-11-12 DIAGNOSIS — Z79899 Other long term (current) drug therapy: Secondary | ICD-10-CM | POA: Diagnosis not present

## 2018-11-12 DIAGNOSIS — M47816 Spondylosis without myelopathy or radiculopathy, lumbar region: Secondary | ICD-10-CM | POA: Diagnosis not present

## 2018-11-12 DIAGNOSIS — G894 Chronic pain syndrome: Secondary | ICD-10-CM | POA: Diagnosis not present

## 2018-11-12 MED ORDER — OXYCODONE-ACETAMINOPHEN 7.5-325 MG PO TABS: 1.0000 | ORAL_TABLET | Freq: Three times a day (TID) | ORAL | 0 refills | Status: DC | PRN

## 2018-11-12 NOTE — Telephone Encounter (Signed)

## 2018-11-12 NOTE — Progress Notes (Signed)
Subjective:    Patient ID: Kevin [name], male    DOB: May 26, 1941, 78 y.o.   MRN: [id number]  HPI: Kevin [name] is a 78 y.o. male who returns for follow up appointment for chronic pain and medication refill. He states his pain is located in his lower back and left knee.He rates his pain 6. His current exercise regime is walking and performing stretching exercises.  Kevin [name] states he was sitting on a stool 3- 4 weeks ago, it collapsed and landed on his buttocks, his wife helped him up.   Kevin [name] equivalent is 32.81  MME.  Last Oral Swab was Performed on 04/16/2018, it was consistent. Oral Swab was Performed today.    Pain Inventory Average Pain 6 Pain Right Now 6 My pain is sharp  In the last 24 hours, has pain interfered with the following? General activity  Relation with others  Enjoyment of life 6 What TIME of day is your pain at its worst? all the time Sleep (in general) Good  Pain is worse with: sitting Pain improves with: sitting/standing Relief from Meds: 5  Mobility use a cane  Function retired  Neuro/Psych tremor  Prior Studies x-rays  Physicians involved in your care Orthopedist Dr.?   Family History  Problem Relation Age of Onset  . Parkinsonism Father   . Dementia Father   . Cancer Father        Throat  . Colon cancer Mother        died in 53s  . Cancer Mother        Colon   Social History   Socioeconomic History  . Marital status: Married    Spouse name: Not on file  . Number of children: Not on file  . Years of education: Not on file  . Highest education level: Not on file  Occupational History  . Occupation: retired    Comment: maintenance; electrician  Social Needs  . Financial resource strain: Not on file  . Food insecurity    Worry: Not on file    Inability: Not on file  . Transportation needs    Medical: Not on file    Non-medical: Not on file  Tobacco Use  . Smoking status: Former Smoker    Packs/day: 2.00   Years: 28.00    Pack years: 56.00    Types: Cigarettes    Quit date: 05/31/1987    Years since quitting: 31.4  . Smokeless tobacco: Never Used  . Tobacco comment: quit smoking "in my 63's"  Substance and Sexual Activity  . Alcohol use: Yes    Alcohol/week: 0.0 standard drinks    Comment: rare, mikes hard lemonade  . Drug use: No    Types: Marijuana    Comment: hx of-4-5 yrs ago  . Sexual activity: Not Currently  Lifestyle  . Physical activity    Days per week: Not on file    Minutes per session: Not on file  . Stress: Not on file  Relationships  . Social Herbalist on phone: Not on file    Gets together: Not on file    Attends religious service: Not on file    Active member of club or organization: Not on file    Attends meetings of clubs or organizations: Not on file    Relationship status: Not on file  Other Topics Concern  . Not on file  Social History Narrative   Married 33 years in 2015.  Lived together for 12 years. 2 boys from first wife. 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       Hobbies: yardwork, [place] barn, woodwork, Orthoptist   Past Surgical History:  Procedure Laterality Date  . ABDOMINAL AORTIC ANEURYSM REPAIR  2010  . CAROTID ENDARTERECTOMY Left 09/20/2004  . CATARACT EXTRACTION, BILATERAL     2016-2017  . COLONOSCOPY    . CORONARY ARTERY BYPASS GRAFT N/A 06/27/2018   Procedure: CORONARY ARTERY BYPASS GRAFTING (CABG), ON PUMP, TIMES FOUR, USING LEFT INTERNAL MAMMARY ARTERY AND ENDOSCOPICALLY HARVESTED LEFT GREATER SAPHENOUS VEIN;  Surgeon: Melrose Nakayama, MD;  Location: [facility];  Service: Open Heart Surgery;  Laterality: N/A;  . ENDARTERECTOMY Right 10/12/2015   Procedure: RIGHT CAROTID ENDARTERECTOMY WITH PATCH ANGIOPLASTY;  Surgeon: Elam Dutch, MD;  Location: [facility];  Service: Vascular;  Laterality: Right;  .  INGUINAL HERNIA REPAIR Left 02/05/2013   Procedure: HERNIA REPAIR INGUINAL ADULT;  Surgeon: Joyice Faster. Cornett, MD;  Location: [facility];  Service: General;  Laterality: Left;  . INGUINAL HERNIA REPAIR Left   . INSERTION OF MESH Left 02/05/2013   Procedure: INSERTION OF MESH;  Surgeon: Joyice Faster. Cornett, MD;  Location: [facility];  Service: General;  Laterality: Left;  . LEFT HEART CATH AND CORONARY ANGIOGRAPHY N/A 06/21/2018   Procedure: LEFT HEART CATH AND CORONARY ANGIOGRAPHY;  Surgeon: Jettie Booze, MD;  Location: [facility];  Service: Cardiovascular;  Laterality: N/A;  . SHOULDER ARTHROSCOPY WITH ROTATOR CUFF REPAIR AND SUBACROMIAL DECOMPRESSION Left 05/01/2014   Procedure: LEFT ARTHROSCOPY SHOULDER SUBACROMIAL DECOMPRESSION,DISTAL CLAVICAL RESECTION AND ROTATOR CUFF REPAIR;  Surgeon: Marin Shutter, MD;  Location: [facility];  Service: Orthopedics;  Laterality: Left;  . TEE WITHOUT CARDIOVERSION N/A 06/27/2018   Procedure: TRANSESOPHAGEAL ECHOCARDIOGRAM (TEE);  Surgeon: Melrose Nakayama, MD;  Location: [facility];  Service: Open Heart Surgery;  Laterality: N/A;  . TESTICLAR CYST EXCISION Left   . TONSILLECTOMY    . VASECTOMY     Past Medical History:  Diagnosis Date  . Arthritis    DDD- Lower Back  . Bradycardia   . CAP (community acquired pneumonia) 03/17/2015   "THAT'S WHAT THEY ARE THINKING; THEY ARE NOT SURE"  . Carotid artery occlusion   . Chest pain 06/19/2018  . Chronic lower back pain    DDD  . CKD (chronic kidney disease)    Dr. Corliss Parish  . 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 Diverticulosis   . Enlarged prostate    takes Flomax daily  . History of blood transfusion    "probably when they did aortic aneurysm"  . History of colon polyps    benign  .  Hyperlipidemia   . Hypertension    takes Amlodipine and Zebeta daily  . Insomnia    takes Melatonin nightly  . Joint pain   . 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     . Muscle spasm    takes Zanaflex daily as needed  . Peripheral edema    takes Furosemide daily  . Pneumonia    hx of   .  Rheumatic fever 1946  . Rheumatoid arthritis ([place] [facility])   . Short-term memory loss    minimal  . Shortness of breath dyspnea    with exertion but lasts very short period of time  . TIA (transient ischemic attack)    x 2;takes Plavix daily  . Urinary frequency    takes Flomax daily  . Urinary urgency    There were no vitals taken for this visit.  Opioid Risk Score:   Fall Risk Score:  `1  Depression screen PHQ 2/9  Depression screen [facility] 2/9 08/09/2018 04/16/2018 01/22/2018 10/19/2017 10/05/2017 09/25/2017 07/03/2017  Decreased Interest 0 0 0 0 0 0 0  Down, Depressed, Hopeless 0 0 0 0 0 0 0  PHQ - 2 Score 0 0 0 0 0 0 0  Some recent data might be hidden      Review of Systems     Objective:   Physical Exam Vitals signs and nursing note reviewed.  Constitutional:      Appearance: Normal appearance.  Neck:     Musculoskeletal: Normal range of motion and neck supple.  Cardiovascular:     Rate and Rhythm: Normal rate and regular rhythm.     Pulses: Normal pulses.     Heart sounds: Normal heart sounds.  Pulmonary:     Effort: Pulmonary effort is normal.     Breath sounds: Normal breath sounds.  Musculoskeletal:     Comments: Normal Muscle Bulk and Muscle Testing Reveals:  Upper Extremities: Right: Full ROM and Muscle Strength 5/5 Left: Decreased ROM 90 Degrees and Muscle Strength 5/5 Lumbar Paraspinal Tenderness: L-4-L-5 Lower Extremities: Full ROM and Muscle Strength 5/5 Arises from chair with ease Narrow Based Gait   Skin:    General: Skin is warm and dry.  Neurological:     Mental Status: He is alert and oriented to person, place, and time.  Psychiatric:         Mood and Affect: Mood normal.        Behavior: Behavior normal.           Assessment & Plan:  1. Lumbar Spondylosis/ Lumbar Stenosis:11/12/2018 Continue current medication regime. Refilled:Oxycodone 7.5/325 mg one tablet every 8 hours as needed #70.11/12/2018 We will continue the opioid monitoring program, this consists of regular clinic visits, examinations, urine drug screen, pill counts as well as use of [place] Controlled Substance Reporting System. 2. ChronicBilateralKnee Pain:S/P Cortisone Injection by Dr Paulla Fore on 03/14/2018, note was reviewed.Orthopedist Following Dr. Paulla Fore.11/12/2018 3. Right Hip Pain: Orthopedist Following.No complaints today. 11/12/2018  15 minutes of face to face patient care time was spent during this visit. All questions were encouraged and answered.  F/U in 1 month

## 2018-11-16 ENCOUNTER — Other Ambulatory Visit: Payer: Self-pay

## 2018-11-16 ENCOUNTER — Ambulatory Visit (INDEPENDENT_AMBULATORY_CARE_PROVIDER_SITE_OTHER): Payer: Medicare HMO

## 2018-11-16 DIAGNOSIS — Z5181 Encounter for therapeutic drug level monitoring: Secondary | ICD-10-CM

## 2018-11-16 DIAGNOSIS — I4891 Unspecified atrial fibrillation: Secondary | ICD-10-CM

## 2018-11-16 LAB — POCT INR: INR: 3.2 — AB (ref 2.0–3.0)

## 2018-11-16 NOTE — Patient Instructions (Signed)
Description   Skip tomorrow's dosage of Coumadin, then start taking 1 tablet daily except for 1.5 tablets Mondays and Fridays. Recheck INR in 3 weeks. Coumadin Clinic [phone] Main [phone]

## 2018-11-19 ENCOUNTER — Ambulatory Visit (INDEPENDENT_AMBULATORY_CARE_PROVIDER_SITE_OTHER): Payer: Medicare HMO | Admitting: Family Medicine

## 2018-11-19 ENCOUNTER — Encounter: Payer: Self-pay | Admitting: Family Medicine

## 2018-11-19 VITALS — BP 129/58

## 2018-11-19 DIAGNOSIS — R05 Cough: Secondary | ICD-10-CM | POA: Diagnosis not present

## 2018-11-19 DIAGNOSIS — R059 Cough, unspecified: Secondary | ICD-10-CM

## 2018-11-19 DIAGNOSIS — R509 Fever, unspecified: Secondary | ICD-10-CM

## 2018-11-19 MED ORDER — AZITHROMYCIN 250 MG PO TABS: ORAL_TABLET | ORAL | 0 refills | Status: DC

## 2018-11-19 NOTE — Progress Notes (Signed)
    Chief Complaint:  Kevin [name] is a 78 y.o. male who presents today for a virtual office visit with a chief complaint of cough.   Assessment/Plan:  Cough / Fever  Constellation of symptoms concerning for COVID-19 infection.  Patient does not want to be tested at this time.  Given the symptoms of been persistent for the past couple weeks, it is possible that he could have atypical pneumonia as he has had this in this past and states that the current symptoms are similar to prior episodes of this.  We will send a prescription for azithromycin to treat for possible atypical pneumonia.  Discussed reasons to return to care and seek emergent care.  If symptoms not improve the next few days, he is agreeable to get tested for COVID-19.    Subjective:  HPI:  Cough  Started 2-3 weeks ago. Worsened over the last few days but is a bit better today.  He has tried using his albuterol inhaler which is helped a little bit.  Cough is productive of thick sputum.  Associated symptome include chills, body aches, and fatigue.  No known sick contacts.  No other treatments tried.  No other obvious alleviating or aggravating factors.  ROS: Per HPI  PMH: He reports that he quit smoking about 31 years ago. His smoking use included cigarettes. He has a 56.00 pack-year smoking history. He has never used smokeless tobacco. He reports current alcohol use. He reports that he does not use drugs.      Objective/Observations  Physical Exam: NAD, speaking in full sentences  Virtual Visit via Video   I connected with Kevin [name] on 11/19/18 at  3:40 PM EDT by a video enabled telemedicine application and verified that I am speaking with the correct person using two identifiers. I discussed the limitations of evaluation and management by telemedicine and the availability of in person appointments. The patient expressed understanding and agreed to proceed.   Patient location: Home Provider location: [place] participating in the virtual visit: Myself and Patient     Algis Greenhouse. Jerline Pain, MD 11/19/2018 11:45 AM

## 2018-11-26 ENCOUNTER — Telehealth (HOSPITAL_COMMUNITY): Payer: Self-pay

## 2018-11-26 NOTE — Telephone Encounter (Signed)
Pt insurance is active and benefits verified through [facility]. Co-pay $0.00, DED $0.00/$0.00 met, out of pocket $3,400.00/$2,147.84 met, co-insurance 0%. No pre-authorization required. Passport, 11/26/2018 @ 9:29AM, EAD#[id number]

## 2018-11-29 ENCOUNTER — Telehealth: Payer: Self-pay | Admitting: General Practice

## 2018-11-29 ENCOUNTER — Ambulatory Visit (INDEPENDENT_AMBULATORY_CARE_PROVIDER_SITE_OTHER): Payer: Medicare HMO | Admitting: Family Medicine

## 2018-11-29 ENCOUNTER — Other Ambulatory Visit: Payer: Medicare HMO

## 2018-11-29 ENCOUNTER — Other Ambulatory Visit: Payer: Self-pay

## 2018-11-29 ENCOUNTER — Encounter: Payer: Self-pay | Admitting: Family Medicine

## 2018-11-29 DIAGNOSIS — Z7189 Other specified counseling: Secondary | ICD-10-CM

## 2018-11-29 DIAGNOSIS — R6889 Other general symptoms and signs: Secondary | ICD-10-CM | POA: Diagnosis not present

## 2018-11-29 DIAGNOSIS — R059 Cough, unspecified: Secondary | ICD-10-CM

## 2018-11-29 DIAGNOSIS — B349 Viral infection, unspecified: Secondary | ICD-10-CM

## 2018-11-29 DIAGNOSIS — Z20822 Contact with and (suspected) exposure to covid-19: Secondary | ICD-10-CM

## 2018-11-29 DIAGNOSIS — Z20828 Contact with and (suspected) exposure to other viral communicable diseases: Secondary | ICD-10-CM | POA: Diagnosis not present

## 2018-11-29 DIAGNOSIS — R05 Cough: Secondary | ICD-10-CM

## 2018-11-29 NOTE — Telephone Encounter (Signed)
Pt has been scheduled for covid testing.  Pt was referred by: Billie Ruddy, MD Scheduled pt with provider directly.

## 2018-11-29 NOTE — Addendum Note (Signed)
Addended by: Denman George on: 11/29/2018 11:19 AM   Modules accepted: Orders

## 2018-11-29 NOTE — Progress Notes (Signed)
Virtual Visit via Telephone Note  I connected with Kevin [name] on 11/29/18 at 10:30 AM EDT by telephone and verified that I am speaking with the correct person using two identifiers.   I discussed the limitations, risks, security and privacy concerns of performing an evaluation and management service by telephone and the availability of in person appointments. I also discussed with the patient that there may be a patient responsible charge related to this service. The patient expressed understanding and agreed to proceed.  Location patient: home Location provider: work or home office Participants present for the call: patient, provider Patient did not have a visit in the prior 7 days to address this/these issue(s).   History of Present Illness: Pt is a 78 yo male with pmh sig for afib on coumadin, HTN, PAD, diastolic CHF, CAD s/p CABG, AAA s/p repair, RA, OA, CKD 4, former smoker typically seen by Dr. [name].    Pt seen 11/19/18 for cough and fever.  Given concern for atypical pneumonia pt started on Azithromycin.  Pt completed abx course.  States felt good on Saturday, but Sunday night started feeling "generally bad" again.  Pt states he is still coughing, but it sounds better.  Also feels stuffy.  Had a subjective fever last night.  Pt states he has not been out of the house, but his wife wears a mask when she goes out.  Pt denies sore throat, ear pain or pressure, facial pain or pressure, HA.  Has chronic rhinorrhea.  Has not tried anything for his symptoms.  Pt does not want to be seen in person, nor have COVID-19 testing.  At one point in the conversation, pt referrs to this provider as "darling".  Advised not to.   Observations/Objective: Patient sounds cheerful and well on the phone. I do not appreciate any SOB. Speech and thought processing are grossly intact. Patient reported vitals:  BP 137/72  Assessment and Plan: Cough  -discussed possible causes including viral illness,  pneumonia, bronchitis, PE-though on coumadin -given continued symptoms discussed CXR and COVID testing.  Pt initially declines.  Pt subsequently agrees to COVID testing, but not CXR. -continue supportive care -given precautions  Viral illness -discussed abx are not effective for viral infections. -continue supportive care. -will set up COVID testing. -given precautions.  Pt strongly advised to seek care at ED or UC for worsening symptoms  Educated About Covid-19 Virus Infection    Follow Up Instructions: F/u with pcp in the next few days.  99441 5-10 [id number] 11-20 9443 21-30 I did not refer this patient for an OV in the next 24 hours for this/these issue(s).  I discussed the assessment and treatment plan with the patient. The patient was provided an opportunity to ask questions and all were answered. The patient agreed with the plan and demonstrated an understanding of the instructions.   The patient was advised to call back or seek an in-person evaluation if the symptoms worsen or if the condition fails to improve as anticipated.  I provided 12 minutes of non-face-to-face time during this encounter.   Billie Ruddy, MD

## 2018-11-29 NOTE — Telephone Encounter (Signed)
-----   Message from Billie Ruddy, MD sent at 11/29/2018 10:45 AM EDT ----- Pt seen via e-visit x 2.  Initially treated for suspected pneumonia, but continues to have symptoms. COVID testing advised.

## 2018-12-03 ENCOUNTER — Telehealth: Payer: Self-pay

## 2018-12-03 LAB — DRUG TOX MONITOR 1 W/CONF, ORAL FLD
Alprazolam: NEGATIVE ng/mL (ref <0.50)
Amphetamines: NEGATIVE ng/mL (ref <10)
Barbiturates: NEGATIVE ng/mL (ref <10)
Benzodiazepines: POSITIVE ng/mL — AB (ref <0.50)
Buprenorphine: NEGATIVE ng/mL (ref <0.10)
Chlordiazepoxide: NEGATIVE ng/mL (ref <0.50)
Clonazepam: NEGATIVE ng/mL (ref <0.50)
Cocaine: NEGATIVE ng/mL (ref <5.0)
Codeine: NEGATIVE ng/mL (ref <2.5)
Diazepam: 2.03 ng/mL — ABNORMAL HIGH (ref <0.50)
Dihydrocodeine: NEGATIVE ng/mL (ref <2.5)
Fentanyl: NEGATIVE ng/mL (ref <0.10)
Flunitrazepam: NEGATIVE ng/mL (ref <0.50)
Flurazepam: NEGATIVE ng/mL (ref <0.50)
Heroin Metabolite: NEGATIVE ng/mL (ref <1.0)
Hydrocodone: NEGATIVE ng/mL (ref <2.5)
Hydromorphone: NEGATIVE ng/mL (ref <2.5)
Lorazepam: NEGATIVE ng/mL (ref <0.50)
MARIJUANA: NEGATIVE ng/mL (ref <2.5)
MDMA: NEGATIVE ng/mL (ref <10)
Meprobamate: NEGATIVE ng/mL (ref <2.5)
Methadone: NEGATIVE ng/mL (ref <5.0)
Midazolam: NEGATIVE ng/mL (ref <0.50)
Morphine: NEGATIVE ng/mL (ref <2.5)
Nicotine Metabolite: NEGATIVE ng/mL (ref <5.0)
Nordiazepam: 3.69 ng/mL — ABNORMAL HIGH (ref <0.50)
Norhydrocodone: NEGATIVE ng/mL (ref <2.5)
Noroxycodone: 21.2 ng/mL — ABNORMAL HIGH (ref <2.5)
Opiates: POSITIVE ng/mL — AB (ref <2.5)
Oxazepam: NEGATIVE ng/mL (ref <0.50)
Oxycodone: 242.5 ng/mL — ABNORMAL HIGH (ref <2.5)
Oxymorphone: NEGATIVE ng/mL (ref <2.5)
Phencyclidine: NEGATIVE ng/mL (ref <10)
Tapentadol: NEGATIVE ng/mL (ref <5.0)
Temazepam: NEGATIVE ng/mL (ref <0.50)
Tramadol: NEGATIVE ng/mL (ref <5.0)
Triazolam: NEGATIVE ng/mL (ref <0.50)
Zolpidem: NEGATIVE ng/mL (ref <5.0)

## 2018-12-03 LAB — DRUG TOX ALC METAB W/CON, ORAL FLD: Alcohol Metabolite: NEGATIVE ng/mL (ref <25)

## 2018-12-03 LAB — HOUSE ACCOUNT TRACKING

## 2018-12-03 NOTE — Telephone Encounter (Signed)
Awaiting covid test results

## 2018-12-04 ENCOUNTER — Telehealth: Payer: Self-pay | Admitting: *Deleted

## 2018-12-04 ENCOUNTER — Ambulatory Visit: Payer: Medicare HMO | Admitting: Family Medicine

## 2018-12-04 NOTE — Telephone Encounter (Signed)
Oral swab drug screen was consistent for prescribed medications.  ?

## 2018-12-05 LAB — NOVEL CORONAVIRUS, NAA: SARS-CoV-2, NAA: NOT DETECTED

## 2018-12-07 ENCOUNTER — Other Ambulatory Visit: Payer: Self-pay

## 2018-12-07 ENCOUNTER — Ambulatory Visit (INDEPENDENT_AMBULATORY_CARE_PROVIDER_SITE_OTHER): Payer: Medicare HMO | Admitting: *Deleted

## 2018-12-07 ENCOUNTER — Other Ambulatory Visit: Payer: Self-pay | Admitting: Family Medicine

## 2018-12-07 ENCOUNTER — Other Ambulatory Visit: Payer: Self-pay | Admitting: Internal Medicine

## 2018-12-07 DIAGNOSIS — Z5181 Encounter for therapeutic drug level monitoring: Secondary | ICD-10-CM | POA: Diagnosis not present

## 2018-12-07 DIAGNOSIS — I4891 Unspecified atrial fibrillation: Secondary | ICD-10-CM | POA: Diagnosis not present

## 2018-12-07 LAB — POCT INR: INR: 2.9 (ref 2.0–3.0)

## 2018-12-07 NOTE — Patient Instructions (Signed)
Description   Today take 1/2 tablet then start taking 1 tablet daily except for 1.5 tablets on Mondays. Recheck INR in 3 weeks. [facility] [phone] Main [phone]

## 2018-12-09 NOTE — Progress Notes (Signed)
[name] [address], [place] [address] Phone: [phone] Subjective:    I'm seeing this patient by the request  of:    CC: left back pain follow up   FOY:DXAJOINOMV   10/30/2018: Patient does have spondylosis.  I do think that this can contribute to some of his pain.  Discussed with him in great length.  Patient is on gabapentin 300 mg twice daily already for a nerve modulator and is taking pain medications.  Due to patient's other comorbidities he is unable to do any type of anti-inflammatory.  I believe though that patient has some residual inflammation and potentially scarring of the left lower lobe of the lung that is likely contributing to more of the left flank pain.  Short course of albuterol given and warned of potential side effects.  Blood pressure is well controlled at the moment.  I do believe that some constipation could be playing a role as well.  Patient is on chronic narcotics.  Patient given some samples of linzess to try but only to take every other day 150 mg.  I would not like to do this long-term but I would like to see if this would help some of his bowel regimen.  Will be significantly difficult secondary to patient's other comorbidities involved follow-up again in 3 to 4 weeks  Update 12/10/2018 Kevin [name] is a 78 y.o. male coming in with complaint of back pain. Patient states that his pain comes and goes since last visit. Is using inhaler and Linzess. Patient also notes that his has been having intermittent side pain that causes him to not be able to walk. Today pain has gone away.  Patient states that it is very intermittent.  Still does not feel like does not wake him up at night.  Intermittent radiation.    Past Medical History:  Diagnosis Date  . Arthritis    DDD- Lower Back  . Bradycardia   . CAP (community acquired pneumonia) 03/17/2015   "THAT'S WHAT THEY ARE THINKING; THEY ARE NOT SURE"  . Carotid artery occlusion    . Chest pain 06/19/2018  . Chronic lower back pain    DDD  . CKD (chronic kidney disease)    Dr. Corliss Parish  . CKD (chronic kidney disease), stage III ([place]) 01/27/2014   GFR just above 30--> just below 30 11/10/14 refer to nephrology Creatinine 2 baseline but went up to 2.5 peak Likely RAS- lisinopril not great choice   . Diverticulosis   . Enlarged prostate    takes Flomax daily  . History of blood transfusion    "probably when they did aortic aneurysm"  . History of colon polyps    benign  . Hyperlipidemia   . Hypertension    takes Amlodipine and Zebeta daily  . Insomnia    takes Melatonin nightly  . Joint pain   . 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     . Muscle spasm    takes Zanaflex daily as needed  . Peripheral edema    takes Furosemide daily  . Pneumonia    hx of   . Rheumatic fever 1946  . Rheumatoid arthritis ([place])   . Short-term memory loss    minimal  . Shortness of breath dyspnea    with exertion but lasts very short period of time  . TIA (transient ischemic attack)    x 2;takes Plavix daily  . Urinary frequency  takes Flomax daily  . Urinary urgency    Past Surgical History:  Procedure Laterality Date  . ABDOMINAL AORTIC ANEURYSM REPAIR  2010  . CAROTID ENDARTERECTOMY Left 09/20/2004  . CATARACT EXTRACTION, BILATERAL     2016-2017  . COLONOSCOPY    . CORONARY ARTERY BYPASS GRAFT N/A 06/27/2018   Procedure: CORONARY ARTERY BYPASS GRAFTING (CABG), ON PUMP, TIMES FOUR, USING LEFT INTERNAL MAMMARY ARTERY AND ENDOSCOPICALLY HARVESTED LEFT GREATER SAPHENOUS VEIN;  Surgeon: Melrose Nakayama, MD;  Location: [facility];  Service: Open Heart Surgery;  Laterality: N/A;  . ENDARTERECTOMY Right 10/12/2015   Procedure: RIGHT CAROTID ENDARTERECTOMY WITH PATCH ANGIOPLASTY;  Surgeon: Elam Dutch, MD;  Location: [facility];  Service: Vascular;  Laterality: Right;  . INGUINAL HERNIA REPAIR Left 02/05/2013   Procedure: HERNIA REPAIR INGUINAL  ADULT;  Surgeon: Joyice Faster. Cornett, MD;  Location: [facility];  Service: General;  Laterality: Left;  . INGUINAL HERNIA REPAIR Left   . INSERTION OF MESH Left 02/05/2013   Procedure: INSERTION OF MESH;  Surgeon: Joyice Faster. Cornett, MD;  Location: [facility];  Service: General;  Laterality: Left;  . LEFT HEART CATH AND CORONARY ANGIOGRAPHY N/A 06/21/2018   Procedure: LEFT HEART CATH AND CORONARY ANGIOGRAPHY;  Surgeon: Jettie Booze, MD;  Location: [facility];  Service: Cardiovascular;  Laterality: N/A;  . SHOULDER ARTHROSCOPY WITH ROTATOR CUFF REPAIR AND SUBACROMIAL DECOMPRESSION Left 05/01/2014   Procedure: LEFT ARTHROSCOPY SHOULDER SUBACROMIAL DECOMPRESSION,DISTAL CLAVICAL RESECTION AND ROTATOR CUFF REPAIR;  Surgeon: Marin Shutter, MD;  Location: [facility];  Service: Orthopedics;  Laterality: Left;  . TEE WITHOUT CARDIOVERSION N/A 06/27/2018   Procedure: TRANSESOPHAGEAL ECHOCARDIOGRAM (TEE);  Surgeon: Melrose Nakayama, MD;  Location: [facility];  Service: Open Heart Surgery;  Laterality: N/A;  . TESTICLAR CYST EXCISION Left   . TONSILLECTOMY    . VASECTOMY     Social History   Socioeconomic History  . Marital status: Married    Spouse name: Not on file  . Number of children: Not on file  . Years of education: Not on file  . Highest education level: Not on file  Occupational History  . Occupation: retired    Comment: maintenance; electrician  Social Needs  . Financial resource strain: Not on file  . Food insecurity    Worry: Not on file    Inability: Not on file  . Transportation needs    Medical: Not on file    Non-medical: Not on file  Tobacco Use  . Smoking status: Former Smoker    Packs/day: 2.00    Years: 28.00    Pack years: 56.00    Types: Cigarettes    Quit date: 05/31/1987    Years since quitting: 31.5  . Smokeless tobacco: Never Used  . Tobacco comment: quit smoking "in my 63's"  Substance and Sexual Activity  . Alcohol use: Yes     Alcohol/week: 0.0 standard drinks    Comment: rare, mikes hard lemonade  . Drug use: No    Types: Marijuana    Comment: hx of-4-5 yrs ago  . Sexual activity: Not Currently  Lifestyle  . Physical activity    Days per week: Not on file    Minutes per session: Not on file  . Stress: Not on file  Relationships  . Social Herbalist on phone: Not on file    Gets together: Not on file    Attends religious service: Not on file  Active member of club or organization: Not on file    Attends meetings of clubs or organizations: Not on file    Relationship status: Not on file  Other Topics Concern  . Not on file  Social History Narrative   Married 33 years in 2015. Lived together for 12 years. 2 boys from first wife. 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       Hobbies: yardwork, [place] barn, woodwork, collect knive   No Known Allergies Family History  Problem Relation Age of Onset  . Parkinsonism Father   . Dementia Father   . Cancer Father        Throat  . Colon cancer Mother        died in 69s  . Cancer Mother        Colon     Current Outpatient Medications (Cardiovascular):  .  atorvastatin (LIPITOR) 20 MG tablet, TAKE 1 TABLET EVERY DAY .  fenofibrate 160 MG tablet, TAKE 1 TABLET EVERY DAY .  furosemide (LASIX) 20 MG tablet, Take 1 tablet (20 mg total) by mouth daily. .  metoprolol tartrate (LOPRESSOR) 25 MG tablet, Take 1 tablet (25 mg total) by mouth 2 (two) times daily. (Patient taking differently: Take 12.5 mg by mouth 2 (two) times daily. )  Current Outpatient Medications (Respiratory):  .  albuterol (VENTOLIN HFA) 108 (90 Base) MCG/ACT inhaler, Inhale 2 puffs into the lungs every 12 (twelve) hours as needed for wheezing or shortness of breath.  Current Outpatient Medications (Analgesics):  .  aspirin EC 81 MG EC tablet, Take 1 tablet  (81 mg total) by mouth daily. Marland Kitchen  oxyCODONE-acetaminophen (PERCOCET) 7.5-325 MG tablet, Take 1 tablet by mouth every 8 (eight) hours as needed for severe pain.  Current Outpatient Medications (Hematological):  .  vitamin B-12 (CYANOCOBALAMIN) 1000 MCG tablet, Take 1,000 mcg by mouth daily. Marland Kitchen  warfarin (COUMADIN) 2.5 MG tablet, TAKE AS DIRECTED BY COUMADIN CLINIC  Current Outpatient Medications (Other):  .  azithromycin (ZITHROMAX) 250 MG tablet, Take 2 tabs day 1, then 1 tab daily .  BIOTIN PO, Take 1 tablet by mouth daily. .  Cholecalciferol (VITAMIN D-3) 1000 UNITS CAPS, Take 1,000 Units by mouth daily.  .  diazepam (VALIUM) 5 MG tablet, TAKE 1/2 TO 1 TABLET q8 hours PRN pain sleep/muscle spasm-only if wife is around+can watch you closely when taking due to sedation risk .  gabapentin (NEURONTIN) 300 MG capsule, Take 1 capsule (300 mg total) by mouth 2 (two) times daily. Marland Kitchen  linaclotide (LINZESS) 290 MCG CAPS capsule, Take 290 mcg by mouth every other day. .  Melatonin 10 MG TABS, Take 10 mg by mouth at bedtime.  .  tamsulosin (FLOMAX) 0.4 MG CAPS capsule, TAKE 1 CAPSULE EVERY DAY .  tiZANidine (ZANAFLEX) 4 MG tablet, TAKE 1 TABLET EVERY 6 HOURS AS NEEDED FOR MUSCLE SPASM(S)    Past medical history, social, surgical and family history all reviewed in electronic medical record.  No pertanent information unless stated regarding to the chief complaint.   Review of Systems:  No headache, visual changes, nausea, vomiting, diarrhea, constipation, dizziness, abdominal pain, skin rash, fevers, chills, night sweats, weight loss, swollen lymph nodes, body aches, joint swelling, chest pain,  mood changes.  Positive muscle aches mild shortness of breath  Objective  Blood pressure 132/84, height 5' 7.5" (  1.715 m), weight 180 lb (81.6 kg).    General: No apparent distress alert and oriented x3 mood and affect normal, dressed appropriately.  HEENT: Pupils equal, extraocular movements intact   Respiratory: Mild shortness of breath at baseline Cardiovascular: No lower extremity edema, non tender, no erythema  Skin: Warm dry intact with no signs of infection or rash on extremities or on axial skeleton.  Abdomen: Soft nontender  Neuro: Cranial nerves II through XII are intact, neurovascularly intact in all extremities with 2+ DTRs and 2+ pulses.  Lymph: No lymphadenopathy of posterior or anterior cervical chain or axillae bilaterally.  Gait antalgic walking with the aid of a cane MSK: Arthritic changes of multiple joints. Back exam does have some loss of lordosis with mild degenerative scoliosis.  Discussed with patient that if straight leg test.  Less tenderness than previous exam.  Minimal discomfort.  Lower extremity strength 4/5 but symmetric.   Impression and Recommendations:     This case required medical decision making of moderate complexity. The above documentation has been reviewed and is accurate and complete Lyndal Pulley, DO       Note: This dictation was prepared with Dragon dictation along with smaller phrase technology. Any transcriptional errors that result from this process are unintentional.

## 2018-12-10 ENCOUNTER — Ambulatory Visit (INDEPENDENT_AMBULATORY_CARE_PROVIDER_SITE_OTHER): Payer: Medicare HMO | Admitting: Family Medicine

## 2018-12-10 ENCOUNTER — Other Ambulatory Visit: Payer: Self-pay

## 2018-12-10 ENCOUNTER — Encounter: Payer: Self-pay | Admitting: Family Medicine

## 2018-12-10 DIAGNOSIS — M47816 Spondylosis without myelopathy or radiculopathy, lumbar region: Secondary | ICD-10-CM | POA: Diagnosis not present

## 2018-12-10 NOTE — Assessment & Plan Note (Signed)
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  We discussed the possibility of an MRI which patient declined at the moment.  Patient does not want any injections again so I do not think it would change management.  Discussed which activities he does not want to avoid.  Patient is to increase her.  .  We did discuss that some of this could be more the lungs.  I discussed the possibility of a CT of the chest which patient declined.  We will continue conservative therapy and patient will see me again in 6 weeks.  Spent  25 minutes with patient face-to-face and had greater than 50% of counseling including as described above in assessment and plan.

## 2018-12-10 NOTE — Patient Instructions (Signed)
Let's continue with the plan Exercises 3x a week If shortness of breath increases would like to get CT chest See me back in 6 weeks

## 2018-12-11 ENCOUNTER — Telehealth (HOSPITAL_COMMUNITY): Payer: Self-pay

## 2018-12-12 ENCOUNTER — Encounter: Payer: Self-pay | Admitting: Registered Nurse

## 2018-12-12 ENCOUNTER — Encounter: Payer: Medicare HMO | Attending: Physical Medicine & Rehabilitation | Admitting: Registered Nurse

## 2018-12-12 ENCOUNTER — Other Ambulatory Visit: Payer: Self-pay

## 2018-12-12 VITALS — BP 155/90 | HR 60 | Temp 97.7°F | Resp 14 | Ht 67.5 in | Wt 181.0 lb

## 2018-12-12 DIAGNOSIS — G894 Chronic pain syndrome: Secondary | ICD-10-CM | POA: Diagnosis not present

## 2018-12-12 DIAGNOSIS — M25551 Pain in right hip: Secondary | ICD-10-CM | POA: Diagnosis not present

## 2018-12-12 DIAGNOSIS — Z79899 Other long term (current) drug therapy: Secondary | ICD-10-CM

## 2018-12-12 DIAGNOSIS — M47816 Spondylosis without myelopathy or radiculopathy, lumbar region: Secondary | ICD-10-CM | POA: Insufficient documentation

## 2018-12-12 DIAGNOSIS — M1712 Unilateral primary osteoarthritis, left knee: Secondary | ICD-10-CM | POA: Diagnosis not present

## 2018-12-12 DIAGNOSIS — Z5181 Encounter for therapeutic drug level monitoring: Secondary | ICD-10-CM

## 2018-12-12 DIAGNOSIS — I739 Peripheral vascular disease, unspecified: Secondary | ICD-10-CM | POA: Insufficient documentation

## 2018-12-12 MED ORDER — OXYCODONE-ACETAMINOPHEN 7.5-325 MG PO TABS: 1.0000 | ORAL_TABLET | Freq: Three times a day (TID) | ORAL | 0 refills | Status: DC | PRN

## 2018-12-12 NOTE — Progress Notes (Signed)
Subjective:    Patient ID: Kevin [name], male    DOB: 03-19-1941, 78 y.o.   MRN: [id number]  HPI: Kevin [name] is a 78 y.o. male who returns for follow up appointment for chronic pain and medication refill. He states his pain is located in his lower-back. He  Rates his pain 4. His current exercise regime is walking and light household chores.    Mr. Lipkin Morphine equivalent is 32.81 MME.  Last Oral Swab was Performed on 11/12/2018, it was consistent.   Pain Inventory Average Pain 6 Pain Right Now 4 My pain is constant, dull and aching  In the last 24 hours, has pain interfered with the following? General activity 8 Relation with others 8 Enjoyment of life 9 What TIME of day is your pain at its worst? daytime, evening Sleep (in general) Fair  Pain is worse with: walking and bending Pain improves with: medication Relief from Meds: 6  Mobility use a cane ability to climb steps?  no do you drive?  yes  Function retired  Neuro/Psych weakness tremor trouble walking spasms  Prior Studies n/a  Physicians involved in your care n/a   Family History  Problem Relation Age of Onset  . Parkinsonism Father   . Dementia Father   . Cancer Father        Throat  . Colon cancer Mother        died in 84s  . Cancer Mother        Colon   Social History   Socioeconomic History  . Marital status: Married    Spouse name: Not on file  . Number of children: Not on file  . Years of education: Not on file  . Highest education level: Not on file  Occupational History  . Occupation: retired    Comment: maintenance; electrician  Social Needs  . Financial resource strain: Not on file  . Food insecurity    Worry: Not on file    Inability: Not on file  . Transportation needs    Medical: Not on file    Non-medical: Not on file  Tobacco Use  . Smoking status: Former Smoker    Packs/day: 2.00    Years: 28.00    Pack years: 56.00    Types: Cigarettes    Quit date: 05/31/1987     Years since quitting: 31.5  . Smokeless tobacco: Never Used  . Tobacco comment: quit smoking "in my 81's"  Substance and Sexual Activity  . Alcohol use: Yes    Alcohol/week: 0.0 standard drinks    Comment: rare, mikes hard lemonade  . Drug use: No    Types: Marijuana    Comment: hx of-4-5 yrs ago  . Sexual activity: Not Currently  Lifestyle  . Physical activity    Days per week: Not on file    Minutes per session: Not on file  . Stress: Not on file  Relationships  . Social Herbalist on phone: Not on file    Gets together: Not on file    Attends religious service: Not on file    Active member of club or organization: Not on file    Attends meetings of clubs or organizations: Not on file    Relationship status: Not on file  Other Topics Concern  . Not on file  Social History Narrative   Married 33 years in 2015. Lived together for 12 years. 2 boys from first wife. 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       Hobbies: yardwork, [place] barn, woodwork, Orthoptist   Past Surgical History:  Procedure Laterality Date  . ABDOMINAL AORTIC ANEURYSM REPAIR  2010  . CAROTID ENDARTERECTOMY Left 09/20/2004  . CATARACT EXTRACTION, BILATERAL     2016-2017  . COLONOSCOPY    . CORONARY ARTERY BYPASS GRAFT N/A 06/27/2018   Procedure: CORONARY ARTERY BYPASS GRAFTING (CABG), ON PUMP, TIMES FOUR, USING LEFT INTERNAL MAMMARY ARTERY AND ENDOSCOPICALLY HARVESTED LEFT GREATER SAPHENOUS VEIN;  Surgeon: Melrose Nakayama, MD;  Location: [place];  Service: Open Heart Surgery;  Laterality: N/A;  . ENDARTERECTOMY Right 10/12/2015   Procedure: RIGHT CAROTID ENDARTERECTOMY WITH PATCH ANGIOPLASTY;  Surgeon: Elam Dutch, MD;  Location: [place];  Service: Vascular;  Laterality: Right;  . INGUINAL HERNIA REPAIR Left 02/05/2013   Procedure: HERNIA REPAIR INGUINAL ADULT;   Surgeon: Joyice Faster. Cornett, MD;  Location: [place];  Service: General;  Laterality: Left;  . INGUINAL HERNIA REPAIR Left   . INSERTION OF MESH Left 02/05/2013   Procedure: INSERTION OF MESH;  Surgeon: Joyice Faster. Cornett, MD;  Location: [place];  Service: General;  Laterality: Left;  . LEFT HEART CATH AND CORONARY ANGIOGRAPHY N/A 06/21/2018   Procedure: LEFT HEART CATH AND CORONARY ANGIOGRAPHY;  Surgeon: Jettie Booze, MD;  Location: [place] [facility];  Service: Cardiovascular;  Laterality: N/A;  . SHOULDER ARTHROSCOPY WITH ROTATOR CUFF REPAIR AND SUBACROMIAL DECOMPRESSION Left 05/01/2014   Procedure: LEFT ARTHROSCOPY SHOULDER SUBACROMIAL DECOMPRESSION,DISTAL CLAVICAL RESECTION AND ROTATOR CUFF REPAIR;  Surgeon: Marin Shutter, MD;  Location: [place];  Service: Orthopedics;  Laterality: Left;  . TEE WITHOUT CARDIOVERSION N/A 06/27/2018   Procedure: TRANSESOPHAGEAL ECHOCARDIOGRAM (TEE);  Surgeon: Melrose Nakayama, MD;  Location: [place];  Service: Open Heart Surgery;  Laterality: N/A;  . TESTICLAR CYST EXCISION Left   . TONSILLECTOMY    . VASECTOMY     Past Medical History:  Diagnosis Date  . Arthritis    DDD- Lower Back  . Bradycardia   . CAP (community acquired pneumonia) 03/17/2015   "THAT'S WHAT THEY ARE THINKING; THEY ARE NOT SURE"  . Carotid artery occlusion   . Chest pain 06/19/2018  . Chronic lower back pain    DDD  . CKD (chronic kidney disease)    Dr. Corliss Parish  . 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 Diverticulosis   . Enlarged prostate    takes Flomax daily  . History of blood transfusion    "probably when they did aortic aneurysm"  . History of colon polyps    benign  . Hyperlipidemia   . Hypertension    takes Amlodipine and Zebeta daily  . Insomnia     takes Melatonin nightly  . Joint pain   . 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     . Muscle spasm    takes Zanaflex daily as needed  . Peripheral edema    takes Furosemide daily  . Pneumonia    hx of   . Rheumatic fever 1946  . Rheumatoid arthritis ([place])   . Short-term memory loss  minimal  . Shortness of breath dyspnea    with exertion but lasts very short period of time  . TIA (transient ischemic attack)    x 2;takes Plavix daily  . Urinary frequency    takes Flomax daily  . Urinary urgency    There were no vitals taken for this visit.  Opioid Risk Score:   Fall Risk Score:  `1  Depression screen PHQ 2/9  Depression screen [facility] 2/9 08/09/2018 04/16/2018 01/22/2018 10/19/2017 10/05/2017 09/25/2017 07/03/2017  Decreased Interest 0 0 0 0 0 0 0  Down, Depressed, Hopeless 0 0 0 0 0 0 0  PHQ - 2 Score 0 0 0 0 0 0 0  Some recent data might be hidden     Review of Systems  All other systems reviewed and are negative.      Objective:   Physical Exam Vitals signs and nursing note reviewed.  Constitutional:      Appearance: Normal appearance.  Neck:     Musculoskeletal: Normal range of motion and neck supple.  Cardiovascular:     Rate and Rhythm: Normal rate and regular rhythm.     Pulses: Normal pulses.     Heart sounds: Normal heart sounds.  Pulmonary:     Effort: Pulmonary effort is normal.     Breath sounds: Normal breath sounds.  Musculoskeletal:     Comments: Normal Muscle Bulk and Muscle Testing Reveals:  Upper Extremities: Full ROM and Muscle Strength 5/5  Lumbar Paraspinal Tenderness: L-4-L-5 Lower Extremities: Full ROM and Muscle Strength 5/5 Arises from chair slowly using cane for support Narrow Based Gait   Skin:    General: Skin is warm and dry.  Neurological:     Mental Status: He is alert and oriented to person, place, and time.  Psychiatric:        Mood and Affect: Mood normal.        Behavior: Behavior normal.            Assessment & Plan:  1. Lumbar Spondylosis/ Lumbar Stenosis:12/12/2018 Continue current medication regime. Refilled:Oxycodone 7.5/325 mg one tablet every 8 hours as needed #70.12/12/2018 We will continue the opioid monitoring program, this consists of regular clinic visits, examinations, urine drug screen, pill counts as well as use of [place] Controlled Substance Reporting System. 2. ChronicBilateralKnee Pain:S/P Cortisone Injection by Dr Paulla Fore on 03/14/2018, note was reviewed.Orthopedist Following Dr. Paulla Fore.12/12/2018 3. Right Hip Pain: Orthopedist Following.No complaints today. 12/12/2018  15 minutes of face to face patient care time was spent during this visit. All questions were encouraged and answered.  F/U in 1 month

## 2018-12-12 NOTE — Progress Notes (Signed)
Virtual Visit via Telephone Note   This visit type was conducted due to national recommendations for restrictions regarding the COVID-19 Pandemic (e.g. social distancing) in an effort to limit this patient's exposure and mitigate transmission in our community.  Due to his co-morbid illnesses, this patient is at least at moderate risk for complications without adequate follow up.  This format is felt to be most appropriate for this patient at this time.  The patient did not have access to video technology/had technical difficulties with video requiring transitioning to audio format only (telephone).  All issues noted in this document were discussed and addressed.  No physical exam could be performed with this format.  Please refer to the patient's chart for his  consent to telehealth for [facility].   Date:  12/13/2018   ID:  Kevin [name], DOB 03/24/1941, MRN [id number]  Patient Location: Home Provider Location: Home  PCP:  Marin Olp, MD  Cardiologist:  Dorris Carnes, MD  Electrophysiologist:  None   Evaluation Performed:  Follow-Up Visit  Chief Complaint:  Follow up of CAD and post op afib    History of Present Illness:    Kevin [name] is a 78 y.o. male with Kevin [name] is a 78 y.o. male with a history of  CAD( s/p CABG in 2020) HTN, HL, bradycardial, PVOD  (s/p AAA repair; s/p bilateral CEA), TIA x 2 on plavix, RA, DCK     The pt was admitted in Jan 2020 with CP   Cath showed:  80% pox LAD; 90% D1; 80% OM2; 100% RCA with L to r collaterals    The pt underwent CABG x 4 (LIMA to LAD; SVG to D1, SVG to OM2; SVG to PDA)   Post op had PAF.   Placed on amiodarone     The patient was seen by B Bhagat in clinic in Feb 2020  I saw him in May   He was doing good   I recomm stopping amiodarone   SInce May he has continued to do well   Breathing is good   No CP  NO dizziness except with quick standing   He denies palpitations      The patient does not  have symptoms concerning for  COVID-19 infection (fever, chills, cough, or new shortness of breath).    Past Medical History:  Diagnosis Date   Arthritis    DDD- Lower Back   Bradycardia    CAP (community acquired pneumonia) 03/17/2015   "THAT'S WHAT THEY ARE THINKING; THEY ARE NOT SURE"   Carotid artery occlusion    Chest pain 06/19/2018   Chronic lower back pain    DDD   CKD (chronic kidney disease)    Dr. 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      Muscle spasm  takes Zanaflex daily as needed   Peripheral edema    takes Furosemide daily   Pneumonia    hx of    Rheumatic fever 1946   Rheumatoid arthritis ([place])    Short-term memory loss    minimal   Shortness of breath dyspnea    with exertion but lasts very short period of time   TIA (transient ischemic attack)    x 2;takes Plavix daily   Urinary frequency    takes Flomax daily   Urinary urgency    Past Surgical History:  Procedure Laterality Date   ABDOMINAL AORTIC ANEURYSM REPAIR  2010   CAROTID ENDARTERECTOMY Left 09/20/2004   CATARACT EXTRACTION, BILATERAL     2016-2017   COLONOSCOPY     CORONARY ARTERY BYPASS GRAFT N/A 06/27/2018   Procedure: CORONARY ARTERY BYPASS GRAFTING (CABG), ON PUMP, TIMES FOUR, USING LEFT INTERNAL MAMMARY ARTERY AND ENDOSCOPICALLY HARVESTED LEFT GREATER SAPHENOUS VEIN;  Surgeon: Melrose Nakayama, MD;  Location: [place];  Service: Open  Heart Surgery;  Laterality: N/A;   ENDARTERECTOMY Right 10/12/2015   Procedure: RIGHT CAROTID ENDARTERECTOMY WITH PATCH ANGIOPLASTY;  Surgeon: Elam Dutch, MD;  Location: [place];  Service: Vascular;  Laterality: Right;   INGUINAL HERNIA REPAIR Left 02/05/2013   Procedure: HERNIA REPAIR INGUINAL ADULT;  Surgeon: Joyice Faster. Cornett, MD;  Location: [place];  Service: General;  Laterality: Left;   INGUINAL HERNIA REPAIR Left    INSERTION OF MESH Left 02/05/2013   Procedure: INSERTION OF MESH;  Surgeon: Joyice Faster. Cornett, MD;  Location: [place];  Service: General;  Laterality: Left;   LEFT HEART CATH AND CORONARY ANGIOGRAPHY N/A 06/21/2018   Procedure: LEFT HEART CATH AND CORONARY ANGIOGRAPHY;  Surgeon: Jettie Booze, MD;  Location: [place] [facility];  Service: Cardiovascular;  Laterality: N/A;   SHOULDER ARTHROSCOPY WITH ROTATOR CUFF REPAIR AND SUBACROMIAL DECOMPRESSION Left 05/01/2014   Procedure: LEFT ARTHROSCOPY SHOULDER SUBACROMIAL DECOMPRESSION,DISTAL CLAVICAL RESECTION AND ROTATOR CUFF REPAIR;  Surgeon: Marin Shutter, MD;  Location: [place];  Service: Orthopedics;  Laterality: Left;   TEE WITHOUT CARDIOVERSION N/A 06/27/2018   Procedure: TRANSESOPHAGEAL ECHOCARDIOGRAM (TEE);  Surgeon: Melrose Nakayama, MD;  Location: [place];  Service: Open Heart Surgery;  Laterality: N/A;   TESTICLAR CYST EXCISION Left    TONSILLECTOMY     VASECTOMY       Current Meds  Medication Sig   albuterol (VENTOLIN HFA) 108 (90 Base) MCG/ACT inhaler Inhale 2 puffs into the lungs every 12 (twelve) hours as needed for wheezing or shortness of breath.   aspirin EC 81 MG EC tablet Take 1 tablet (81 mg total) by mouth daily.   atorvastatin (LIPITOR) 20 MG tablet TAKE 1 TABLET EVERY DAY   BIOTIN PO Take 1 tablet by mouth daily.   Cholecalciferol (VITAMIN D-3) 1000 UNITS CAPS Take 1,000 Units by mouth daily.    diazepam (VALIUM) 5 MG tablet TAKE 1/2 TO 1 TABLET q8  hours PRN pain sleep/muscle spasm-only if wife is around+can watch you closely when taking due to sedation risk   fenofibrate 160 MG tablet TAKE 1 TABLET EVERY DAY   furosemide (LASIX) 20 MG tablet Take 1 tablet (20 mg total) by mouth daily.   gabapentin (NEURONTIN) 300 MG capsule Take 1 capsule (300 mg total) by mouth 2 (two) times daily.   linaclotide (LINZESS) 290 MCG CAPS capsule Take 290 mcg by mouth every other day.   Melatonin 10 MG TABS Take 10  mg by mouth at bedtime.    oxyCODONE-acetaminophen (PERCOCET) 7.5-325 MG tablet Take 1 tablet by mouth every 8 (eight) hours as needed for severe pain.   tamsulosin (FLOMAX) 0.4 MG CAPS capsule TAKE 1 CAPSULE EVERY DAY   tiZANidine (ZANAFLEX) 4 MG tablet TAKE 1 TABLET EVERY 6 HOURS AS NEEDED FOR MUSCLE SPASM(S)   vitamin B-12 (CYANOCOBALAMIN) 1000 MCG tablet Take 1,000 mcg by mouth daily.   warfarin (COUMADIN) 2.5 MG tablet TAKE AS DIRECTED BY COUMADIN CLINIC     Allergies:   Patient has no known allergies.   Social History   Tobacco Use   Smoking status: Former Smoker    Packs/day: 2.00    Years: 28.00    Pack years: 56.00    Types: Cigarettes    Quit date: 05/31/1987    Years since quitting: 31.5   Smokeless tobacco: Never Used   Tobacco comment: quit smoking "in my 33's"  Substance Use Topics   Alcohol use: Yes    Alcohol/week: 0.0 standard drinks    Comment: rare, mikes hard lemonade   Drug use: No    Types: Marijuana    Comment: hx of-4-5 yrs ago     Family Hx: The patient's family history includes Cancer in his father and mother; Colon cancer in his mother; Dementia in his father; Parkinsonism in his father.  ROS:   Please see the history of present illness.    All other systems reviewed and are negative.   Prior CV studies:   The following studies were reviewed today:   Labs/Other Tests and Data Reviewed:    EKG:  No ECG reviewed.  Recent Labs: 07/02/2018: Magnesium 2.1 10/26/2018: ALT 16;  Hemoglobin 11.2; Platelets 274; TSH 0.794 10/31/2018: BUN 33; Creatinine, Ser 2.79; Potassium 4.3; Sodium 137   Recent Lipid Panel Lab Results  Component Value Date/Time   CHOL 113 06/20/2018 05:19 AM   TRIG 177 (H) 06/20/2018 05:19 AM   HDL 23 (L) 06/20/2018 05:19 AM   CHOLHDL 4.9 06/20/2018 05:19 AM   LDLCALC 55 06/20/2018 05:19 AM   LDLDIRECT 134.9 12/26/2012 12:01 PM    Wt Readings from Last 3 Encounters:  12/13/18 179 lb (81.2 kg)  12/12/18 181 lb (82.1 kg)  12/10/18 180 lb (81.6 kg)     Objective:    Vital Signs:  BP 113/75    Pulse 66    Wt 179 lb (81.2 kg)    BMI 27.62 kg/m    VITAL SIGNS:  reviewed  ASSESSMENT & PLAN:    1. Atrial fibrillation   Post op   CLinically does  Not appear to be in atrial fibrillation  I would recomm stopping coumadin  COntinue ASA  FOllow  2  CAD   S/P CABG in Jan 2020   Doing well   Has f/u appt with Leonarda Salon on Sept  3  HL   Excellent LDL control on current meds  4  Hx HTN   BP is good     COVID-19 Education: The signs and symptoms of COVID-19 were discussed with the patient and how to seek care for testing (follow up with PCP or arrange E-visit).  The importance of social distancing was discussed today.  Time:   Today, I have spent 20 minutes with the patient with telehealth technology discussing the above problems.     Medication Adjustments/Labs and Tests Ordered: Current medicines are reviewed at length with the patient today.  Concerns regarding medicines are outlined above.  Tests Ordered: No orders of the defined types were placed in this encounter.   Medication Changes: No orders of the defined types were placed in this encounter.   Follow Up:  F?U at the end of Jan 2021 Signed, Dorris Carnes, MD  12/13/2018 9:31 AM    [place]

## 2018-12-13 ENCOUNTER — Other Ambulatory Visit: Payer: Self-pay | Admitting: Physical Therapy

## 2018-12-13 ENCOUNTER — Telehealth (INDEPENDENT_AMBULATORY_CARE_PROVIDER_SITE_OTHER): Payer: Medicare HMO | Admitting: Internal Medicine

## 2018-12-13 VITALS — BP 113/75 | HR 66 | Wt 179.0 lb

## 2018-12-13 DIAGNOSIS — I2510 Atherosclerotic heart disease of native coronary artery without angina pectoris: Secondary | ICD-10-CM | POA: Diagnosis not present

## 2018-12-13 DIAGNOSIS — I10 Essential (primary) hypertension: Secondary | ICD-10-CM

## 2018-12-13 DIAGNOSIS — E782 Mixed hyperlipidemia: Secondary | ICD-10-CM

## 2018-12-13 DIAGNOSIS — I713 Abdominal aortic aneurysm, ruptured, unspecified: Secondary | ICD-10-CM

## 2018-12-13 NOTE — Addendum Note (Signed)
Addended by: Rodman Key on: 12/13/2018 12:47 PM   Modules accepted: Orders

## 2018-12-13 NOTE — Patient Instructions (Signed)
Medication Instructions:  Your physician has recommended you make the following change in your medication:  1.) stop coumadin  If you need a refill on your cardiac medications before your next appointment, please call your pharmacy.   Lab work: none If you have labs (blood work) drawn today and your tests are completely normal, you will receive your results only by: Marland Kitchen MyChart Message (if you have MyChart) OR . A paper copy in the mail If you have any lab test that is abnormal or we need to change your treatment, we will call you to review the results.  Testing/Procedures: none  Follow-Up: At [facility], you and your health needs are our priority.  As part of our continuing mission to provide you with exceptional heart care, we have created designated Provider Care Teams.  These Care Teams include your primary Cardiologist (physician) and Advanced Practice Providers (APPs -  Physician Assistants and Nurse Practitioners) who all work together to provide you with the care you need, when you need it. You will need a follow up appointment in:  6 months.  Please call our office 2 months in advance to schedule this appointment.  You may see Dorris Carnes, MD or one of the following Advanced Practice Providers on your designated Care Team: Richardson Dopp, PA-C [place], [place] . Daune Perch, NP  Any Other Special Instructions Will Be Listed Below (If Applicable).

## 2018-12-14 ENCOUNTER — Other Ambulatory Visit: Payer: Self-pay | Admitting: Physical Therapy

## 2018-12-14 MED ORDER — LINACLOTIDE 145 MCG PO CAPS: 145.0000 ug | ORAL_CAPSULE | Freq: Every day | ORAL | 0 refills | Status: DC

## 2018-12-25 ENCOUNTER — Telehealth: Payer: Self-pay | Admitting: *Deleted

## 2018-12-25 NOTE — Telephone Encounter (Signed)
1. COVID-19 Pre-Screening Questions:  . In the past 7 to 10 days have you had a cough,  shortness of breath, headache, congestion, fever (100 or greater) body aches, chills, sore throat, or sudden loss of taste or sense of smell? No . Have you been around anyone with known Covid 19? No . Have you been around anyone who is awaiting Covid 19 test results in the past 7 to 10 days? No . Have you been around anyone who has been exposed to Covid 19, or has mentioned symptoms of Covid 19 within the past 7 to 10 days? No  Spoke with wife and she answered the questions.   2. Pt advised of visitor restrictions (no visitors allowed except if needed to conduct the visit). Also advised to arrive at appointment time and wear a mask.

## 2018-12-27 ENCOUNTER — Encounter: Payer: Self-pay | Admitting: Pharmacist

## 2018-12-27 NOTE — Telephone Encounter (Signed)
This encounter was created in error - please disregard.

## 2018-12-27 NOTE — Telephone Encounter (Signed)
Test route Currently responsible is listed as me Default at bottom is Sign at exit [facility]

## 2018-12-27 NOTE — Telephone Encounter (Signed)
[place] Sign on saving note

## 2019-01-02 ENCOUNTER — Other Ambulatory Visit: Payer: Self-pay

## 2019-01-02 NOTE — Telephone Encounter (Signed)
Last OV 10/15/18 Last refill 08/09/18 #90/0 Next OV not scheduled  Forwarding to Dr. [name]

## 2019-01-03 MED ORDER — DIAZEPAM 5 MG PO TABS: ORAL_TABLET | ORAL | 0 refills | Status: DC

## 2019-01-07 ENCOUNTER — Observation Stay (HOSPITAL_COMMUNITY)
Admission: EM | Admit: 2019-01-07 | Discharge: 2019-01-09 | Disposition: A | Discharge status: Home / Self Care | Payer: Medicare HMO | Attending: Internal Medicine | Admitting: Internal Medicine

## 2019-01-07 ENCOUNTER — Ambulatory Visit: Payer: Self-pay

## 2019-01-07 ENCOUNTER — Other Ambulatory Visit: Payer: Self-pay | Admitting: Thoracic Surgery (Cardiothoracic Vascular Surgery)

## 2019-01-07 ENCOUNTER — Other Ambulatory Visit: Payer: Self-pay | Admitting: Family Medicine

## 2019-01-07 ENCOUNTER — Encounter (HOSPITAL_COMMUNITY): Payer: Self-pay

## 2019-01-07 ENCOUNTER — Telehealth: Payer: Self-pay | Admitting: Family Medicine

## 2019-01-07 ENCOUNTER — Emergency Department (HOSPITAL_COMMUNITY): Payer: Medicare HMO

## 2019-01-07 ENCOUNTER — Other Ambulatory Visit: Payer: Self-pay

## 2019-01-07 DIAGNOSIS — R000 Tachycardia, unspecified: Secondary | ICD-10-CM | POA: Diagnosis not present

## 2019-01-07 DIAGNOSIS — Z951 Presence of aortocoronary bypass graft: Secondary | ICD-10-CM | POA: Insufficient documentation

## 2019-01-07 DIAGNOSIS — M069 Rheumatoid arthritis, unspecified: Secondary | ICD-10-CM | POA: Diagnosis not present

## 2019-01-07 DIAGNOSIS — Z7982 Long term (current) use of aspirin: Secondary | ICD-10-CM | POA: Diagnosis not present

## 2019-01-07 DIAGNOSIS — Z66 Do not resuscitate: Secondary | ICD-10-CM | POA: Insufficient documentation

## 2019-01-07 DIAGNOSIS — Z20828 Contact with and (suspected) exposure to other viral communicable diseases: Secondary | ICD-10-CM | POA: Insufficient documentation

## 2019-01-07 DIAGNOSIS — G4700 Insomnia, unspecified: Secondary | ICD-10-CM | POA: Diagnosis not present

## 2019-01-07 DIAGNOSIS — Z87891 Personal history of nicotine dependence: Secondary | ICD-10-CM | POA: Diagnosis not present

## 2019-01-07 DIAGNOSIS — J181 Lobar pneumonia, unspecified organism: Secondary | ICD-10-CM | POA: Diagnosis not present

## 2019-01-07 DIAGNOSIS — R079 Chest pain, unspecified: Secondary | ICD-10-CM

## 2019-01-07 DIAGNOSIS — N401 Enlarged prostate with lower urinary tract symptoms: Secondary | ICD-10-CM | POA: Insufficient documentation

## 2019-01-07 DIAGNOSIS — M545 Low back pain: Secondary | ICD-10-CM | POA: Insufficient documentation

## 2019-01-07 DIAGNOSIS — R0902 Hypoxemia: Secondary | ICD-10-CM | POA: Diagnosis not present

## 2019-01-07 DIAGNOSIS — R0789 Other chest pain: Secondary | ICD-10-CM | POA: Diagnosis not present

## 2019-01-07 DIAGNOSIS — G894 Chronic pain syndrome: Secondary | ICD-10-CM | POA: Diagnosis not present

## 2019-01-07 DIAGNOSIS — Z79899 Other long term (current) drug therapy: Secondary | ICD-10-CM | POA: Diagnosis not present

## 2019-01-07 DIAGNOSIS — J189 Pneumonia, unspecified organism: Secondary | ICD-10-CM | POA: Diagnosis not present

## 2019-01-07 DIAGNOSIS — I5032 Chronic diastolic (congestive) heart failure: Secondary | ICD-10-CM | POA: Diagnosis not present

## 2019-01-07 DIAGNOSIS — I130 Hypertensive heart and chronic kidney disease with heart failure and stage 1 through stage 4 chronic kidney disease, or unspecified chronic kidney disease: Secondary | ICD-10-CM | POA: Diagnosis not present

## 2019-01-07 DIAGNOSIS — N184 Chronic kidney disease, stage 4 (severe): Secondary | ICD-10-CM | POA: Insufficient documentation

## 2019-01-07 DIAGNOSIS — I2510 Atherosclerotic heart disease of native coronary artery without angina pectoris: Secondary | ICD-10-CM | POA: Insufficient documentation

## 2019-01-07 DIAGNOSIS — I959 Hypotension, unspecified: Secondary | ICD-10-CM | POA: Diagnosis not present

## 2019-01-07 DIAGNOSIS — Z8673 Personal history of transient ischemic attack (TIA), and cerebral infarction without residual deficits: Secondary | ICD-10-CM | POA: Insufficient documentation

## 2019-01-07 DIAGNOSIS — I10 Essential (primary) hypertension: Secondary | ICD-10-CM | POA: Diagnosis not present

## 2019-01-07 DIAGNOSIS — I739 Peripheral vascular disease, unspecified: Secondary | ICD-10-CM | POA: Diagnosis not present

## 2019-01-07 DIAGNOSIS — E785 Hyperlipidemia, unspecified: Secondary | ICD-10-CM | POA: Diagnosis not present

## 2019-01-07 DIAGNOSIS — R350 Frequency of micturition: Secondary | ICD-10-CM | POA: Diagnosis not present

## 2019-01-07 DIAGNOSIS — R6510 Systemic inflammatory response syndrome (SIRS) of non-infectious origin without acute organ dysfunction: Secondary | ICD-10-CM

## 2019-01-07 LAB — URINALYSIS, ROUTINE W REFLEX MICROSCOPIC
Bilirubin Urine: NEGATIVE
Glucose, UA: NEGATIVE mg/dL
Hgb urine dipstick: NEGATIVE
Ketones, ur: NEGATIVE mg/dL
Leukocytes,Ua: NEGATIVE
Nitrite: NEGATIVE
Protein, ur: NEGATIVE mg/dL
Specific Gravity, Urine: 1.011 (ref 1.005–1.030)
pH: 5 (ref 5.0–8.0)

## 2019-01-07 LAB — CBC
HCT: 35.8 % — ABNORMAL LOW (ref 39.0–52.0)
Hemoglobin: 11.6 g/dL — ABNORMAL LOW (ref 13.0–17.0)
MCH: 30.4 pg (ref 26.0–34.0)
MCHC: 32.4 g/dL (ref 30.0–36.0)
MCV: 94 fL (ref 80.0–100.0)
Platelets: 308 10*3/uL (ref 150–400)
RBC: 3.81 MIL/uL — ABNORMAL LOW (ref 4.22–5.81)
RDW: 13.9 % (ref 11.5–15.5)
WBC: 13.4 10*3/uL — ABNORMAL HIGH (ref 4.0–10.5)
nRBC: 0 % (ref 0.0–0.2)

## 2019-01-07 LAB — PROTIME-INR
INR: 1.2 (ref 0.8–1.2)
Prothrombin Time: 14.9 seconds (ref 11.4–15.2)

## 2019-01-07 LAB — BASIC METABOLIC PANEL
Anion gap: 12 (ref 5–15)
BUN: 32 mg/dL — ABNORMAL HIGH (ref 8–23)
CO2: 22 mmol/L (ref 22–32)
Calcium: 8.7 mg/dL — ABNORMAL LOW (ref 8.9–10.3)
Chloride: 101 mmol/L (ref 98–111)
Creatinine, Ser: 2.64 mg/dL — ABNORMAL HIGH (ref 0.61–1.24)
GFR calc Af Amer: 25 mL/min — ABNORMAL LOW (ref >60)
GFR calc non Af Amer: 22 mL/min — ABNORMAL LOW (ref >60)
Glucose, Bld: 138 mg/dL — ABNORMAL HIGH (ref 70–99)
Potassium: 3.8 mmol/L (ref 3.5–5.1)
Sodium: 135 mmol/L (ref 135–145)

## 2019-01-07 LAB — PROCALCITONIN: Procalcitonin: <0.1 ng/mL

## 2019-01-07 LAB — TROPONIN I (HIGH SENSITIVITY)
Troponin I (High Sensitivity): 5 ng/L (ref <18)
Troponin I (High Sensitivity): 6 ng/L (ref <18)

## 2019-01-07 LAB — LACTIC ACID, PLASMA: Lactic Acid, Venous: 1.6 mmol/L (ref 0.5–1.9)

## 2019-01-07 LAB — SARS CORONAVIRUS 2 BY RT PCR (HOSPITAL ORDER, PERFORMED IN ~~LOC~~ HOSPITAL LAB): SARS Coronavirus 2: NEGATIVE

## 2019-01-07 MED ORDER — SODIUM CHLORIDE 0.9 % IV SOLN
500.0000 mg | INTRAVENOUS | Status: DC
  Administered 2019-01-07 – 2019-01-08 (×2): 500 mg via INTRAVENOUS
  Filled 2019-01-07 (×2): qty 500

## 2019-01-07 MED ORDER — MELATONIN 3 MG PO TABS
6.0000 mg | ORAL_TABLET | ORAL | Status: AC
  Administered 2019-01-08: 6 mg via ORAL
  Filled 2019-01-07: qty 2

## 2019-01-07 MED ORDER — TIZANIDINE HCL 4 MG PO TABS
4.0000 mg | ORAL_TABLET | Freq: Four times a day (QID) | ORAL | Status: DC | PRN
  Administered 2019-01-08 (×2): 4 mg via ORAL
  Filled 2019-01-07 (×2): qty 1

## 2019-01-07 MED ORDER — HEPARIN SODIUM (PORCINE) 5000 UNIT/ML IJ SOLN
5000.0000 [IU] | Freq: Three times a day (TID) | INTRAMUSCULAR | Status: DC
  Administered 2019-01-07 – 2019-01-09 (×4): 5000 [IU] via SUBCUTANEOUS
  Filled 2019-01-07 (×4): qty 1

## 2019-01-07 MED ORDER — OXYCODONE-ACETAMINOPHEN 7.5-325 MG PO TABS
1.0000 | ORAL_TABLET | Freq: Three times a day (TID) | ORAL | Status: DC | PRN
  Administered 2019-01-08 – 2019-01-09 (×3): 1 via ORAL
  Filled 2019-01-07 (×3): qty 1

## 2019-01-07 MED ORDER — ACETAMINOPHEN 325 MG PO TABS
650.0000 mg | ORAL_TABLET | Freq: Once | ORAL | Status: AC
  Administered 2019-01-07: 650 mg via ORAL
  Filled 2019-01-07: qty 2

## 2019-01-07 MED ORDER — SODIUM CHLORIDE 0.9 % IV SOLN
2.0000 g | INTRAVENOUS | Status: DC
  Administered 2019-01-07 – 2019-01-08 (×2): 2 g via INTRAVENOUS
  Filled 2019-01-07 (×2): qty 20
  Filled 2019-01-07: qty 2

## 2019-01-07 MED ORDER — SODIUM CHLORIDE 0.9% FLUSH
3.0000 mL | Freq: Once | INTRAVENOUS | Status: AC
  Administered 2019-01-07: 3 mL via INTRAVENOUS

## 2019-01-07 NOTE — Telephone Encounter (Signed)
Thanks-we will look for hospital notes

## 2019-01-07 NOTE — H&P (Signed)
History and Physical    DELRICO MINEHART SWF:[id number] DOB: 06/09/1940 DOA: 01/07/2019  PCP: Marin Olp, MD Patient coming from: Home  Chief Complaint: Not feeling well  HPI: Kevin [name] is a 78 y.o. 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  Patient states for the past few days he has been feeling warm.  He has been experiencing shortness of breath and coughing up clear/yellowish sputum.  He is also having intermittent, nonexertional left-sided chest pain for the past few days.  He describes it as aching, 2-4 out of 10 in intensity.  Chest pain is not associated with dyspnea or diaphoresis.  Not complaining of chest pain at present.  No other complaints.  ED Course: Temperature 100.9 F.  Pulse 105 and respiratory rate 24 on arrival.  SPO2 96 to 100% on room air.  White count 13.4.  Lactic acid normal.  COVID-19 rapid test negative.  Blood culture x2 pending.  UA and urine culture pending.  Initial high-sensitivity troponin negative, repeat pending.  Chest x-ray showing left lung base opacity suspicious for pneumonia versus atelectasis. Patient received Tylenol, ceftriaxone, and azithromycin.  Review of Systems:  All systems reviewed and apart from history of presenting illness, are negative.  Past Medical History:  Diagnosis Date  . Arthritis    DDD- Lower Back  . Bradycardia   . CAP (community acquired pneumonia) 03/17/2015   "THAT'S WHAT THEY ARE THINKING; THEY ARE NOT SURE"  . Carotid artery occlusion   . Chest pain 06/19/2018  . Chronic lower back pain    DDD  . CKD (chronic kidney disease)    Dr. Corliss Parish  . CKD (chronic kidney disease), stage III ([place]) 01/27/2014   GFR just  above 30--> just below 30 11/10/14 refer to nephrology Creatinine 2 baseline but went up to 2.5 peak Likely RAS- lisinopril not great choice   . Diverticulosis   . Enlarged prostate    takes Flomax daily  . History of blood transfusion    "probably when they did aortic aneurysm"  . History of colon polyps    benign  . Hyperlipidemia   . Hypertension    takes Amlodipine and Zebeta daily  . Insomnia    takes Melatonin nightly  . Joint pain   . 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     . Muscle spasm    takes Zanaflex daily as needed  . Peripheral edema    takes Furosemide daily  . Pneumonia    hx of   . Rheumatic fever 1946  . Rheumatoid arthritis ([place])   . Short-term memory loss    minimal  . Shortness of breath dyspnea    with exertion but lasts very short period of time  . TIA (transient ischemic attack)    x 2;takes Plavix daily  . Urinary frequency    takes Flomax daily  . Urinary urgency     Past Surgical History:  Procedure Laterality Date  . ABDOMINAL AORTIC ANEURYSM REPAIR  2010  . CAROTID ENDARTERECTOMY Left 09/20/2004  . CATARACT EXTRACTION, BILATERAL     2016-2017  . COLONOSCOPY    . CORONARY ARTERY BYPASS GRAFT N/A 06/27/2018   Procedure: CORONARY ARTERY BYPASS GRAFTING (  CABG), ON PUMP, TIMES FOUR, USING LEFT INTERNAL MAMMARY ARTERY AND ENDOSCOPICALLY HARVESTED LEFT GREATER SAPHENOUS VEIN;  Surgeon: Melrose Nakayama, MD;  Location: [place];  Service: Open Heart Surgery;  Laterality: N/A;  . ENDARTERECTOMY Right 10/12/2015   Procedure: RIGHT CAROTID ENDARTERECTOMY WITH PATCH ANGIOPLASTY;  Surgeon: Elam Dutch, MD;  Location: [place];  Service: Vascular;  Laterality: Right;  . INGUINAL HERNIA REPAIR Left 02/05/2013   Procedure: HERNIA REPAIR INGUINAL ADULT;  Surgeon: Joyice Faster. Cornett, MD;  Location: [place];  Service: General;  Laterality: Left;  . INGUINAL HERNIA REPAIR Left   . INSERTION OF MESH Left 02/05/2013    Procedure: INSERTION OF MESH;  Surgeon: Joyice Faster. Cornett, MD;  Location: [place];  Service: General;  Laterality: Left;  . LEFT HEART CATH AND CORONARY ANGIOGRAPHY N/A 06/21/2018   Procedure: LEFT HEART CATH AND CORONARY ANGIOGRAPHY;  Surgeon: Jettie Booze, MD;  Location: [facility];  Service: Cardiovascular;  Laterality: N/A;  . SHOULDER ARTHROSCOPY WITH ROTATOR CUFF REPAIR AND SUBACROMIAL DECOMPRESSION Left 05/01/2014   Procedure: LEFT ARTHROSCOPY SHOULDER SUBACROMIAL DECOMPRESSION,DISTAL CLAVICAL RESECTION AND ROTATOR CUFF REPAIR;  Surgeon: Marin Shutter, MD;  Location: [place];  Service: Orthopedics;  Laterality: Left;  . TEE WITHOUT CARDIOVERSION N/A 06/27/2018   Procedure: TRANSESOPHAGEAL ECHOCARDIOGRAM (TEE);  Surgeon: Melrose Nakayama, MD;  Location: [place];  Service: Open Heart Surgery;  Laterality: N/A;  . TESTICLAR CYST EXCISION Left   . TONSILLECTOMY    . VASECTOMY       reports that he quit smoking about 31 years ago. His smoking use included cigarettes. He has a 56.00 pack-year smoking history. He has never used smokeless tobacco. He reports current alcohol use. He reports that he does not use drugs.  No Known Allergies  Family History  Problem Relation Age of Onset  . Parkinsonism Father   . Dementia Father   . Cancer Father        Throat  . Colon cancer Mother        died in 2s  . Cancer Mother        Colon    Prior to Admission medications   Medication Sig Start Date End Date Taking? Authorizing Provider  albuterol (VENTOLIN HFA) 108 (90 Base) MCG/ACT inhaler Inhale 2 puffs into the lungs every 12 (twelve) hours as needed for wheezing or shortness of breath. 10/31/18  Yes Lyndal Pulley, DO  aspirin EC 81 MG EC tablet Take 1 tablet (81 mg total) by mouth daily. 07/05/18  Yes Barrett, Erin R, PA-C  atorvastatin (LIPITOR) 20 MG tablet TAKE 1 TABLET EVERY DAY Patient taking differently: Take 20 mg by mouth daily.  12/10/18  Yes Marin Olp, MD  BIOTIN PO Take 1 tablet by mouth daily.   Yes [provider]  Cholecalciferol (VITAMIN D-3) 1000 UNITS CAPS Take 1,000 Units by mouth daily.    Yes [provider]  diazepam (VALIUM) 5 MG tablet TAKE 1/2 TO 1 TABLET q8 hours PRN pain sleep/muscle spasm-only if wife is around+can watch you closely when taking due to sedation risk Patient taking differently: Take 2.5-5 mg by mouth See admin instructions. Take 2.5-5mg  every 8 hours as needed for pain sleep/muscle spasm-only if wife is around+can watch pt closely when taking due to sedation risk 01/03/19  Yes Marin Olp, MD  fenofibrate 160 MG tablet TAKE 1 TABLET EVERY DAY Patient taking differently: Take 160 mg by mouth daily.  01/07/19  Yes Marin Olp, MD  furosemide (LASIX) 20 MG tablet Take 1 tablet (20 mg total) by mouth daily. 10/25/18  Yes Vivi Barrack, MD  gabapentin (NEURONTIN) 300 MG capsule Take 1 capsule (300 mg total) by mouth 2 (two) times daily. 08/02/18  Yes Marin Olp, MD  linaclotide [facility]) 145 MCG CAPS capsule Take 1 capsule (145 mcg total) by mouth daily before breakfast. 12/14/18  Yes Lyndal Pulley, DO  Melatonin 10 MG TABS Take 10 mg by mouth at bedtime.    Yes [provider]  metoprolol tartrate (LOPRESSOR) 25 MG tablet Take 25 mg by mouth 2 (two) times daily.    Yes [provider]  oxyCODONE-acetaminophen (PERCOCET) 7.5-325 MG tablet Take 1 tablet by mouth every 8 (eight) hours as needed for severe pain. 12/12/18 12/12/19 Yes Bayard Hugger, NP  tamsulosin (FLOMAX) 0.4 MG CAPS capsule TAKE 1 CAPSULE EVERY DAY Patient taking differently: Take 0.4 mg by mouth daily.  01/07/19  Yes Marin Olp, MD  tiZANidine (ZANAFLEX) 4 MG tablet TAKE 1 TABLET EVERY 6 HOURS AS NEEDED FOR MUSCLE SPASM(S) Patient taking differently: Take 4 mg by mouth at bedtime.  01/07/19  Yes Marin Olp, MD  vitamin B-12 (CYANOCOBALAMIN) 1000 MCG tablet Take 1,000 mcg by mouth  daily.   Yes [provider]  amiodarone (PACERONE) 200 MG tablet Take 100 mg by mouth daily. 12/26/18   [provider]    Physical Exam: Vitals:   01/07/19 2030 01/07/19 2045 01/07/19 2100 01/07/19 2133  BP: 134/75 119/71 113/64 (!) 111/59  Pulse: 84 90 93 97  Resp: 20 (!) 23 (!) 26 16  Temp:    99.1 F (37.3 C)  TempSrc:    Oral  SpO2: 90% 95% 94% 95%  Weight:    80 kg  Height:    5\' 7"  (1.702 m)    Physical Exam  Constitutional: He is oriented to person, place, and time. He appears well-developed and well-nourished. No distress.  HENT:  Head: Normocephalic.  Mouth/Throat: Oropharynx is clear and moist.  Eyes: Right eye exhibits no discharge. Left eye exhibits no discharge.  Neck: Neck supple.  Cardiovascular: Normal rate, regular rhythm and intact distal pulses.  Pulmonary/Chest: Effort normal. No respiratory distress. He has no wheezes. He has no rales.  Speaking clearly in full sentences Reduced breath sounds at the left lung base  Abdominal: Soft. Bowel sounds are normal. He exhibits no distension. There is no abdominal tenderness. There is no guarding.  Musculoskeletal:        General: No edema.  Neurological: He is alert and oriented to person, place, and time.  Skin: Skin is warm and dry. He is not diaphoretic.     Labs on Admission: I have personally reviewed following labs and imaging studies  CBC: Recent Labs  Lab 01/07/19 1820  WBC 13.4*  HGB 11.6*  HCT 35.8*  MCV 94.0  PLT 163   Basic Metabolic Panel: Recent Labs  Lab 01/07/19 1820  NA 135  K 3.8  CL 101  CO2 22  GLUCOSE 138*  BUN 32*  CREATININE 2.64*  CALCIUM 8.7*   GFR: Estimated Creatinine Clearance: 22.6 mL/min (A) (by C-G formula based on SCr of 2.64 mg/dL (H)). Liver Function Tests: No results for input(s): AST, ALT, ALKPHOS, BILITOT, PROT, ALBUMIN in the last 168 hours. No results for input(s): LIPASE, AMYLASE in the last 168 hours. No results for input(s):  AMMONIA in the last 168  hours. Coagulation Profile: Recent Labs  Lab 01/07/19 1820  INR 1.2   Cardiac Enzymes: No results for input(s): CKTOTAL, CKMB, CKMBINDEX, TROPONINI in the last 168 hours. BNP (last 3 results) No results for input(s): PROBNP in the last 8760 hours. HbA1C: No results for input(s): HGBA1C in the last 72 hours. CBG: No results for input(s): GLUCAP in the last 168 hours. Lipid Profile: No results for input(s): CHOL, HDL, LDLCALC, TRIG, CHOLHDL, LDLDIRECT in the last 72 hours. Thyroid Function Tests: No results for input(s): TSH, T4TOTAL, FREET4, T3FREE, THYROIDAB in the last 72 hours. Anemia Panel: No results for input(s): VITAMINB12, FOLATE, FERRITIN, TIBC, IRON, RETICCTPCT in the last 72 hours. Urine analysis:    Component Value Date/Time   COLORURINE YELLOW 01/07/2019 2000   APPEARANCEUR CLEAR 01/07/2019 2000   LABSPEC 1.011 01/07/2019 2000   PHURINE 5.0 01/07/2019 2000   GLUCOSEU NEGATIVE 01/07/2019 2000   GLUCOSEU NEGATIVE 09/24/2010 0758   HGBUR NEGATIVE 01/07/2019 2000   HGBUR negative 11/20/2008 0833   BILIRUBINUR NEGATIVE 01/07/2019 2000   BILIRUBINUR N 10/02/2018 [place] 01/07/2019 2000   PROTEINUR NEGATIVE 01/07/2019 2000   UROBILINOGEN 0.2 10/02/2018 1156   UROBILINOGEN 0.2 03/17/2015 1907   NITRITE NEGATIVE 01/07/2019 2000   LEUKOCYTESUR NEGATIVE 01/07/2019 2000    Radiological Exams on Admission: Dg Chest Port 1 View  Result Date: 01/07/2019 CLINICAL DATA:  Chest pain. EXAM: PORTABLE CHEST 1 VIEW COMPARISON:  08/13/2018 and older exams. FINDINGS: There is opacity at the left lung base the wetting hemidiaphragm. This is likely atelectasis. Left hemidiaphragm is elevated. Small stable granuloma in the right upper lobe. Remainder of the lungs is clear. No convincing pleural effusion.  No pneumothorax. Changes from CABG surgery are noted. Cardiac silhouette normal in size. No mediastinal or hilar masses. Skeletal structures  are grossly intact. IMPRESSION: 1. Left lung base opacity, is likely due to atelectasis. Consider pneumonia if there are consistent clinical findings. 2. No other evidence of acute cardiopulmonary disease. Electronically Signed   By: Lajean Manes M.D.   On: 01/07/2019 19:07    EKG: Independently reviewed.  Sinus rhythm, RBBB.  No significant change compared to prior tracing from February 4/5 2020.  Assessment/Plan Principal Problem:   CAP (community acquired pneumonia) Active Problems:   Essential hypertension   CKD (chronic kidney disease), stage IV (HCC)   SIRS (systemic inflammatory response syndrome) (HCC)   Chest pain  CAP, SIRS Febrile. Slightly tachycardic and tachypneic on arrival.  Not hypoxic.  Labs suggestive of mild leukocytosis.  Lactic acid normal. COVID-19 rapid test negative. Chest x-ray showing left lung base opacity suspicious for pneumonia versus atelectasis. 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  EKG without acute ischemic changes.  Currently chest pain-free. -Cardiac monitoring -Continue to monitor  Hypertension Systolic currently in 703J.  Hold antihypertensives at this time.  Chronic back pain -Continue home oxycodone-acetaminophen, tizanidine  CKD stage IV -Stable.  Creatinine 2.6, at baseline.  Unable to safely order home medications at this time as pharmacy medication reconciliation is pending.  DVT prophylaxis: Subcutaneous heparin Code Status: Discussed with both patient and his wife at bedside.  Patient wishes to be DNR. Family Communication: Wife updated Disposition Plan: Anticipate discharge after clinical improvement. Consults called: None Admission status: It is my clinical opinion that  referral for OBSERVATION is reasonable and necessary in this patient based on the above information provided. The aforementioned taken together are felt to place the patient at high risk for further clinical deterioration. However it is anticipated that the patient may be medically stable for discharge from the hospital within 24 to 48 hours.  The medical decision making on this patient was of high complexity and the patient is at high risk for clinical deterioration, therefore this is a level 3 visit.  Shela Leff MD [facility] Pager [phone]  If 7PM-7AM, please contact night-coverage [email or web address] Password [facility]  01/08/2019, 12:42 AM

## 2019-01-07 NOTE — ED Notes (Signed)
Wife at door of room. Informed that due to airborne precautions she cannot be in room. Informed that covid test has been sent and she may be able to visit in about 1 hour when results are back.  Number for room given to wife.

## 2019-01-07 NOTE — Progress Notes (Signed)
Received from ER to room 3E26 via stretcher. Assisted to bed and positioned for comfort. Oriented to room, bed and unit.

## 2019-01-07 NOTE — Telephone Encounter (Signed)
Pt wife called stating that she has just gotten home from work to find her husband shivering and experiencing chest pain. She states that he is afebrile. Ms Trupiano was told to take her husband to the ER for evaluation now.  No triage was completed. Hx bypass surgery.

## 2019-01-07 NOTE — ED Provider Notes (Signed)
[name] Provider Note   CSN: [id number] Arrival date & time: 01/07/19  1754     History   Chief Complaint Chief Complaint  Patient presents with  . Chest Pain    HPI Kevin [name] is a 78 y.o. male.     78 year old male presents with several days of body aches and now with constant left-sided chest discomfort.  Slight increase in cough.  No sore throat.  Some runny nose.  Denies any dyspnea on exertion.  No nausea vomiting.  Pain does not radiate.  EMS was called and given nitroglycerin as well as aspirin and pain is resolved.  When he arrived here, he was noted to be febrile to 100.9.  Denies any urinary symptoms.  No abdominal discomfort.     Past Medical History:  Diagnosis Date  . Arthritis    DDD- Lower Back  . Bradycardia   . CAP (community acquired pneumonia) 03/17/2015   "THAT'S WHAT THEY ARE THINKING; THEY ARE NOT SURE"  . Carotid artery occlusion   . Chest pain 06/19/2018  . Chronic lower back pain    DDD  . CKD (chronic kidney disease)    Dr. Corliss Parish  . CKD (chronic kidney disease), stage III ([place]) 01/27/2014   GFR just above 30--> just below 30 11/10/14 refer to nephrology Creatinine 2 baseline but went up to 2.5 peak Likely RAS- lisinopril not great choice   . Diverticulosis   . Enlarged prostate    takes Flomax daily  . History of blood transfusion    "probably when they did aortic aneurysm"  . History of colon polyps    benign  . Hyperlipidemia   . Hypertension    takes Amlodipine and Zebeta daily  . Insomnia    takes Melatonin nightly  . Joint pain   . 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     . Muscle spasm    takes Zanaflex daily as needed  . Peripheral edema    takes Furosemide daily  . Pneumonia    hx of   . Rheumatic fever 1946  . Rheumatoid arthritis ([place])   . Short-term memory loss    minimal  . Shortness of breath dyspnea    with exertion but lasts very  short period of time  . TIA (transient ischemic attack)    x 2;takes Plavix daily  . Urinary frequency    takes Flomax daily  . Urinary urgency     Patient Active Problem List   Diagnosis Date Noted  . Atelectasis of left lung 10/30/2018  . Afib ([place]) 07/06/2018  . 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 Constipation 09/19/2017  . Lumbar spondylosis 03/17/2017  . PAD (peripheral artery disease) ([place]) 01/08/2016  . Diastolic CHF ([place]) 52/84/1324  . CAP (community acquired pneumonia) 03/17/2015  . CKD (chronic kidney disease), stage IV ([place]) 01/27/2014  . Family history of malignant neoplasm of gastrointestinal tract 05/15/2013  . Personal history of colonic polyps 05/15/2013  . AAA (abdominal aortic aneurysm, ruptured) ([place]) 01/17/2013  . Inguinal hernia 05/01/2012  . Carotid artery stenosis s/p L carotid endarterectomy 01/12/2012  . History of CVA (cerebrovascular accident) 11/17/2011  . Chronic pain syndrome 12/23/2009  . BURSITIS, HIP 12/21/2009  . Essential tremor 10/05/2009  . ACTINIC KERATOSIS, EAR, LEFT 03/09/2009  . UNSPECIFIED ANEMIA 12/05/2008  . 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 BPH (benign prostatic hyperplasia) 06/04/2007  . Fasting  hyperglycemia 02/05/2007  . Hyperlipidemia 01/03/2007  . Essential hypertension 01/03/2007    Past Surgical History:  Procedure Laterality Date  . ABDOMINAL AORTIC ANEURYSM REPAIR  2010  . CAROTID ENDARTERECTOMY Left 09/20/2004  . CATARACT EXTRACTION, BILATERAL     2016-2017  . COLONOSCOPY    . CORONARY ARTERY BYPASS GRAFT N/A 06/27/2018   Procedure: CORONARY ARTERY BYPASS GRAFTING (CABG), ON PUMP, TIMES FOUR, USING LEFT INTERNAL MAMMARY ARTERY AND ENDOSCOPICALLY HARVESTED LEFT GREATER SAPHENOUS VEIN;  Surgeon: Melrose Nakayama, MD;  Location: [facility];  Service: Open Heart Surgery;  Laterality: N/A;  . ENDARTERECTOMY Right 10/12/2015   Procedure: RIGHT CAROTID ENDARTERECTOMY WITH PATCH  ANGIOPLASTY;  Surgeon: Elam Dutch, MD;  Location: [facility];  Service: Vascular;  Laterality: Right;  . INGUINAL HERNIA REPAIR Left 02/05/2013   Procedure: HERNIA REPAIR INGUINAL ADULT;  Surgeon: Joyice Faster. Cornett, MD;  Location: [facility];  Service: General;  Laterality: Left;  . INGUINAL HERNIA REPAIR Left   . INSERTION OF MESH Left 02/05/2013   Procedure: INSERTION OF MESH;  Surgeon: Joyice Faster. Cornett, MD;  Location: [facility];  Service: General;  Laterality: Left;  . LEFT HEART CATH AND CORONARY ANGIOGRAPHY N/A 06/21/2018   Procedure: LEFT HEART CATH AND CORONARY ANGIOGRAPHY;  Surgeon: Jettie Booze, MD;  Location: [facility];  Service: Cardiovascular;  Laterality: N/A;  . SHOULDER ARTHROSCOPY WITH ROTATOR CUFF REPAIR AND SUBACROMIAL DECOMPRESSION Left 05/01/2014   Procedure: LEFT ARTHROSCOPY SHOULDER SUBACROMIAL DECOMPRESSION,DISTAL CLAVICAL RESECTION AND ROTATOR CUFF REPAIR;  Surgeon: Marin Shutter, MD;  Location: [facility];  Service: Orthopedics;  Laterality: Left;  . TEE WITHOUT CARDIOVERSION N/A 06/27/2018   Procedure: TRANSESOPHAGEAL ECHOCARDIOGRAM (TEE);  Surgeon: Melrose Nakayama, MD;  Location: [facility];  Service: Open Heart Surgery;  Laterality: N/A;  . TESTICLAR CYST EXCISION Left   . TONSILLECTOMY    . VASECTOMY          Home Medications    Prior to Admission medications   Medication Sig Start Date End Date Taking? Authorizing Provider  albuterol (VENTOLIN HFA) 108 (90 Base) MCG/ACT inhaler Inhale 2 puffs into the lungs every 12 (twelve) hours as needed for wheezing or shortness of breath. 10/31/18   Lyndal Pulley, DO  aspirin EC 81 MG EC tablet Take 1 tablet (81 mg total) by mouth daily. 07/05/18   Barrett, Erin R, PA-C  atorvastatin (LIPITOR) 20 MG tablet TAKE 1 TABLET EVERY DAY 12/10/18   Marin Olp, MD  BIOTIN PO Take 1 tablet by mouth daily.    [provider]  Cholecalciferol (VITAMIN D-3) 1000 UNITS CAPS Take  1,000 Units by mouth daily.     [provider]  diazepam (VALIUM) 5 MG tablet TAKE 1/2 TO 1 TABLET q8 hours PRN pain sleep/muscle spasm-only if wife is around+can watch you closely when taking due to sedation risk 01/03/19   Marin Olp, MD  fenofibrate 160 MG tablet TAKE 1 TABLET EVERY DAY 01/07/19   Marin Olp, MD  furosemide (LASIX) 20 MG tablet Take 1 tablet (20 mg total) by mouth daily. 10/25/18   Vivi Barrack, MD  gabapentin (NEURONTIN) 300 MG capsule Take 1 capsule (300 mg total) by mouth 2 (two) times daily. 08/02/18   Marin Olp, MD  linaclotide [facility]) 145 MCG CAPS capsule Take 1 capsule (145 mcg total) by mouth daily before breakfast. 12/14/18   Lyndal Pulley, DO  Melatonin 10 MG TABS Take 10  mg by mouth at bedtime.     [provider]  metoprolol tartrate (LOPRESSOR) 25 MG tablet Take 12.5 mg by mouth 2 (two) times daily.    [provider]  oxyCODONE-acetaminophen (PERCOCET) 7.5-325 MG tablet Take 1 tablet by mouth every 8 (eight) hours as needed for severe pain. 12/12/18 12/12/19  Bayard Hugger, NP  propranolol (INDERAL) 40 MG tablet TAKE 1 TABLET TWICE DAILY 01/07/19   Marin Olp, MD  tamsulosin [facility]) 0.4 MG CAPS capsule TAKE 1 CAPSULE EVERY DAY 01/07/19   Marin Olp, MD  tiZANidine (ZANAFLEX) 4 MG tablet TAKE 1 TABLET EVERY 6 HOURS AS NEEDED FOR MUSCLE SPASM(S) 01/07/19   Marin Olp, MD  vitamin B-12 (CYANOCOBALAMIN) 1000 MCG tablet Take 1,000 mcg by mouth daily.    [provider]    Family History Family History  Problem Relation Age of Onset  . Parkinsonism Father   . Dementia Father   . Cancer Father        Throat  . Colon cancer Mother        died in 20s  . Cancer Mother        Colon    Social History Social History   Tobacco Use  . Smoking status: Former Smoker    Packs/day: 2.00    Years: 28.00    Pack years: 56.00    Types: Cigarettes    Quit date: 05/31/1987    Years since  quitting: 31.6  . Smokeless tobacco: Never Used  . Tobacco comment: quit smoking "in my 45's"  Substance Use Topics  . Alcohol use: Yes    Alcohol/week: 0.0 standard drinks    Comment: rare, mikes hard lemonade  . Drug use: No    Types: Marijuana    Comment: hx of-4-5 yrs ago     Allergies   Patient has no known allergies.   Review of Systems Review of Systems  All other systems reviewed and are negative.    Physical Exam Updated Vital Signs BP (!) 162/74 (BP Location: Right Arm)   Pulse (!) 105   Temp (!) 100.9 F (38.3 C) (Oral)   Resp (!) 24   Ht 1.702 m (5\' 7" )   Wt 81.6 kg   SpO2 (!) 89%   BMI 28.19 kg/m   Physical Exam Vitals signs and nursing note reviewed.  Constitutional:      General: He is not in acute distress.    Appearance: Normal appearance. He is well-developed. He is not toxic-appearing.  HENT:     Head: Normocephalic and atraumatic.  Eyes:     General: Lids are normal.     Conjunctiva/sclera: Conjunctivae normal.     Pupils: Pupils are equal, round, and reactive to light.  Neck:     Musculoskeletal: Normal range of motion and neck supple.     Thyroid: No thyroid mass.     Trachea: No tracheal deviation.  Cardiovascular:     Rate and Rhythm: Normal rate and regular rhythm.     Heart sounds: Normal heart sounds. No murmur. No gallop.   Pulmonary:     Effort: Pulmonary effort is normal. No respiratory distress.     Breath sounds: Normal breath sounds. No stridor. No decreased breath sounds, wheezing, rhonchi or rales.  Abdominal:     General: Bowel sounds are normal. There is no distension.     Palpations: Abdomen is soft.     Tenderness: There is no abdominal tenderness. There is no  rebound.  Musculoskeletal: Normal range of motion.        General: No tenderness.  Skin:    General: Skin is warm and dry.     Findings: No abrasion or rash.  Neurological:     Mental Status: He is alert and oriented to person, place, and time.     GCS:  GCS eye subscore is 4. GCS verbal subscore is 5. GCS motor subscore is 6.     Cranial Nerves: No cranial nerve deficit.     Sensory: No sensory deficit.  Psychiatric:        Speech: Speech normal.        Behavior: Behavior normal.      ED Treatments / Results  Labs (all labs ordered are listed, but only abnormal results are displayed) Labs Reviewed  SARS CORONAVIRUS 2 (HOSPITAL ORDER, [facility])  CULTURE, BLOOD (ROUTINE X 2)  CULTURE, BLOOD (ROUTINE X 2)  URINE CULTURE  BASIC METABOLIC PANEL  CBC  PROTIME-INR  LACTIC ACID, PLASMA  URINALYSIS, ROUTINE W REFLEX MICROSCOPIC  TROPONIN I (HIGH SENSITIVITY)    EKG EKG Interpretation  Date/Time:  Monday January 07 2019 18:17:18 EDT Ventricular Rate:  98 PR Interval:    QRS Duration: 145 QT Interval:  361 QTC Calculation: 461 R Axis:   -53 Text Interpretation:  Sinus rhythm Right bundle branch block Inferior infarct, old Abnormal lateral Q waves No significant change since last tracing Confirmed by Lacretia Leigh ([id number]) on 01/07/2019 6:36:14 PM   Radiology No results found.  Procedures Procedures (including critical care time)  Medications Ordered in ED Medications  sodium chloride flush (NS) 0.9 % injection 3 mL (has no administration in time range)     Initial Impression / Assessment and Plan / ED Course  I have reviewed the triage vital signs and the nursing notes.  Pertinent labs & imaging results that were available during my care of the patient were reviewed by me and considered in my medical decision making (see chart for details).        Patient with evidence of pneumonia on chest x-ray.  Blood cultures and lactate ordered.  COVID test is negative.  Started on IV antibiotics and will be admitted to the hospital  CRITICAL CARE Performed by: Leota Jacobsen Total critical care time: 50 minutes Critical care time was exclusive of separately billable procedures and treating other  patients. Critical care was necessary to treat or prevent imminent or life-threatening deterioration. Critical care was time spent personally by me on the following activities: development of treatment plan with patient and/or surrogate as well as nursing, discussions with consultants, evaluation of patient's response to treatment, examination of patient, obtaining history from patient or surrogate, ordering and performing treatments and interventions, ordering and review of laboratory studies, ordering and review of radiographic studies, pulse oximetry and re-evaluation of patient's condition.   Kevin [name] was evaluated in Emergency Department on 01/07/2019 for the symptoms described in the history of present illness. He was evaluated in the context of the global COVID-19 pandemic, which necessitated consideration that the patient might be at risk for infection with the SARS-CoV-2 virus that causes COVID-19. Institutional protocols and algorithms that pertain to the evaluation of patients at risk for COVID-19 are in a state of rapid change based on information released by regulatory bodies including the CDC and federal and state organizations. These policies and algorithms were followed during the patient's care in the ED. Final Clinical Impressions(s) /  ED Diagnoses   Final diagnoses:  Chest pain    ED Discharge Orders    None       Lacretia Leigh, MD 01/07/19 1956

## 2019-01-07 NOTE — Telephone Encounter (Signed)
FYI

## 2019-01-07 NOTE — Telephone Encounter (Signed)
Message routed to PCP.

## 2019-01-07 NOTE — ED Notes (Signed)
ED TO INPATIENT HANDOFF REPORT  ED Nurse Name and Phone #: Sherrine Maples [phone]  S Name/Age/Gender Kevin [name] 78 y.o. male Room/Bed: 043C/043C  Code Status   Code Status: DNR  Home/SNF/Other Home Patient oriented to: self, place, time and situation Is this baseline? Yes   Triage Complete: Triage complete  Chief Complaint Chest Pain  Triage Note Pt arrives EMS from home with c/o non radiating chest pain since 4 pm. Pt also c/o groin pain. Given aspirin 324 ands nitro x1 with complete relief of chest pain.,20 g left fore arm.   Allergies No Known Allergies  Level of Care/Admitting Diagnosis ED Disposition    ED Disposition Condition [facility] Area: [place] [[id number]]  Level of Care: Telemetry Cardiac [103]  I expect the patient will be discharged within 24 hours: Yes  LOW acuity---Tx typically complete <24 hrs---ACUTE conditions typically can be evaluated <24 hours---LABS likely to return to acceptable levels <24 hours---IS near functional baseline---EXPECTED to return to current living arrangement---NOT newly hypoxic: Meets criteria for 5C-Observation unit  Covid Evaluation: Confirmed COVID Negative  Diagnosis: CAP (community acquired pneumonia) [659935]  Admitting Physician: Shela Leff [[id number]]  Attending Physician: Shela Leff [[id number]]  PT Class (Do Not Modify): Observation [104]  PT Acc Code (Do Not Modify): Observation [10022]       B Medical/Surgery History Past Medical History:  Diagnosis Date  . Arthritis    DDD- Lower Back  . Bradycardia   . CAP (community acquired pneumonia) 03/17/2015   "THAT'S WHAT THEY ARE THINKING; THEY ARE NOT SURE"  . Carotid artery occlusion   . Chest pain 06/19/2018  . Chronic lower back pain    DDD  . CKD (chronic kidney disease)    Dr. Corliss Parish  . 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 Diverticulosis   . Enlarged prostate    takes Flomax daily  . History of blood transfusion    "probably when they did aortic aneurysm"  . History of colon polyps    benign  . Hyperlipidemia   . Hypertension    takes Amlodipine and Zebeta daily  . Insomnia    takes Melatonin nightly  . Joint pain   . 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     . Muscle spasm    takes Zanaflex daily as needed  . Peripheral edema    takes Furosemide daily  . Pneumonia    hx of   . Rheumatic fever 1946  . Rheumatoid arthritis ([place])   . Short-term memory loss    minimal  . Shortness of breath dyspnea    with exertion but lasts very short period of time  . TIA (transient ischemic attack)    x 2;takes Plavix daily  . Urinary frequency    takes Flomax daily  . Urinary urgency    Past Surgical History:  Procedure Laterality Date  . ABDOMINAL AORTIC ANEURYSM REPAIR  2010  . CAROTID ENDARTERECTOMY Left 09/20/2004  . CATARACT EXTRACTION, BILATERAL     2016-2017  . COLONOSCOPY    . 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 ENDARTERECTOMY Right 10/12/2015   Procedure: RIGHT CAROTID ENDARTERECTOMY WITH PATCH ANGIOPLASTY;  Surgeon: Elam Dutch, MD;  Location: [facility];  Service: Vascular;  Laterality: Right;  . INGUINAL HERNIA REPAIR Left 02/05/2013   Procedure: HERNIA REPAIR INGUINAL ADULT;  Surgeon: Joyice Faster. Cornett, MD;  Location: [facility];  Service: General;  Laterality: Left;  . INGUINAL HERNIA REPAIR Left   . INSERTION OF MESH Left 02/05/2013   Procedure: INSERTION OF MESH;  Surgeon: Joyice Faster. Cornett, MD;  Location: [facility];  Service: General;  Laterality: Left;  . LEFT HEART CATH AND CORONARY ANGIOGRAPHY N/A 06/21/2018   Procedure: LEFT HEART CATH AND CORONARY ANGIOGRAPHY;  Surgeon: Jettie Booze, MD;  Location: [facility];  Service: Cardiovascular;  Laterality: N/A;  . SHOULDER ARTHROSCOPY WITH ROTATOR CUFF REPAIR AND SUBACROMIAL DECOMPRESSION Left 05/01/2014   Procedure: LEFT ARTHROSCOPY SHOULDER SUBACROMIAL DECOMPRESSION,DISTAL CLAVICAL RESECTION AND ROTATOR CUFF REPAIR;  Surgeon: Marin Shutter, MD;  Location: [facility];  Service: Orthopedics;  Laterality: Left;  . TEE WITHOUT CARDIOVERSION N/A 06/27/2018   Procedure: TRANSESOPHAGEAL ECHOCARDIOGRAM (TEE);  Surgeon: Melrose Nakayama, MD;  Location: [facility];  Service: Open Heart Surgery;  Laterality: N/A;  . TESTICLAR CYST EXCISION Left   . TONSILLECTOMY    . VASECTOMY       A IV Location/Drains/Wounds Patient Lines/Drains/Airways Status   Active Line/Drains/Airways    Name:   Placement date:   Placement time:   Site:   Days:   Peripheral IV 01/07/19 Anterior;Left;Proximal Forearm   01/07/19    1757    Forearm   less than 1   Peripheral IV 01/07/19 Right Antecubital   01/07/19    2010    Antecubital   less than 1   External Urinary Catheter   06/29/18    2054    -   192   Incision (Closed) 05/01/14 Shoulder Left   05/01/14    1217     1712   Incision (Closed) 10/12/15 Neck Right   10/12/15    1001     1183   Incision (Closed) 06/27/18 Leg Left   06/27/18    1111     194   Incision (Closed) 06/27/18 Chest Other (Comment)   06/27/18    1111     194   Incision (Closed) 06/27/18 Leg Right   06/27/18    1217     194          Intake/Output Last 24 hours No intake or output data in the 24 hours ending 01/07/19 2101  Labs/Imaging Results for orders placed or performed during the hospital encounter of 01/07/19 (from the past 48 hour(s))  Basic metabolic panel     Status: Abnormal   Collection  Time: 01/07/19  6:20 PM  Result Value Ref Range   Sodium 135 135 - 145 mmol/L   Potassium 3.8 3.5 - 5.1 mmol/L   Chloride 101 98 - 111 mmol/L   CO2 22 22 - 32 mmol/L   Glucose, Bld 138 (H) 70 - 99 mg/dL   BUN 32 (H) 8 - 23 mg/dL   Creatinine, Ser 2.64 (H) 0.61 - 1.24 mg/dL   Calcium 8.7 (L) 8.9 - 10.3 mg/dL   GFR calc non Af Amer 22 (L) >60 mL/min   GFR calc Af Amer 25 (L) >60 mL/min   Anion gap 12 5 -  15    Comment: Performed at [facility], [place] [address], [place], [place] [address]  CBC     Status: Abnormal   Collection Time: 01/07/19  6:20 PM  Result Value Ref Range   WBC 13.4 (H) 4.0 - 10.5 K/uL   RBC 3.81 (L) 4.22 - 5.81 MIL/uL   Hemoglobin 11.6 (L) 13.0 - 17.0 g/dL   HCT 35.8 (L) 39.0 - 52.0 %   MCV 94.0 80.0 - 100.0 fL   MCH 30.4 26.0 - 34.0 pg   MCHC 32.4 30.0 - 36.0 g/dL   RDW 13.9 11.5 - 15.5 %   Platelets 308 150 - 400 K/uL   nRBC 0.0 0.0 - 0.2 %    Comment: Performed at [facility], [place] [address], [place], [place] [address]  Troponin I (High Sensitivity)     Status: None   Collection Time: 01/07/19  6:20 PM  Result Value Ref Range   Troponin I (High Sensitivity) 5 <18 ng/L    Comment: (NOTE) Elevated high sensitivity troponin I (hsTnI) values and significant  changes across serial measurements may suggest ACS but many other  chronic and acute conditions are known to elevate hsTnI results.  Refer to the "Links" section for chest pain algorithms and additional  guidance. Performed at [facility], [place] [address], [place], [place] [address]   Protime-INR (order if Patient is taking Coumadin / Warfarin)     Status: None   Collection Time: 01/07/19  6:20 PM  Result Value Ref Range   Prothrombin Time 14.9 11.4 - 15.2 seconds   INR 1.2 0.8 - 1.2    Comment: (NOTE) INR goal varies based on device and disease states. Performed at [facility], [place] [address], [place], [place] [address]   Lactic acid, plasma     Status: None    Collection Time: 01/07/19  6:24 PM  Result Value Ref Range   Lactic Acid, Venous 1.6 0.5 - 1.9 mmol/L    Comment: Performed at [facility] [address] [place],  [address]  SARS Coronavirus 2 [facility] order, Performed in [facility] hospital lab) Nasopharyngeal Nasopharyngeal Swab     Status: None   Collection Time: 01/07/19  6:25 PM   Specimen: Nasopharyngeal Swab  Result Value Ref Range   SARS Coronavirus 2 NEGATIVE NEGATIVE    Comment: (NOTE) If result is NEGATIVE SARS-CoV-2 target nucleic acids are NOT DETECTED. The SARS-CoV-2 RNA is generally detectable in upper and lower  respiratory specimens during the acute phase of infection. The lowest  concentration of SARS-CoV-2 viral copies this assay can detect is 250  copies / mL. A negative result does not preclude SARS-CoV-2 infection  and should not be used as the sole basis for treatment or other  patient management decisions.  A negative result may occur with  improper specimen collection / handling, submission of specimen other  than nasopharyngeal swab, presence of viral mutation(s) within the  areas targeted by this assay, and inadequate number of viral copies  (<250 copies / mL). A negative result must be combined with clinical  observations, patient history, and epidemiological information. If result is POSITIVE SARS-CoV-2 target nucleic acids are DETECTED. The SARS-CoV-2 RNA is generally detectable in upper and lower  respiratory specimens dur ing the acute phase of infection.  Positive  results are indicative of active infection with SARS-CoV-2.  Clinical  correlation with patient history and other diagnostic information is  necessary  to determine patient infection status.  Positive results do  not rule out bacterial infection or co-infection with other viruses. If result is PRESUMPTIVE POSTIVE SARS-CoV-2 nucleic acids MAY BE PRESENT.   A presumptive positive result was obtained on the submitted  specimen  and confirmed on repeat testing.  While 2019 novel coronavirus  (SARS-CoV-2) nucleic acids may be present in the submitted sample  additional confirmatory testing may be necessary for epidemiological  and / or clinical management purposes  to differentiate between  SARS-CoV-2 and other Sarbecovirus currently known to infect humans.  If clinically indicated additional testing with an alternate test  methodology [phone]) is advised. The SARS-CoV-2 RNA is generally  detectable in upper and lower respiratory sp ecimens during the acute  phase of infection. The expected result is Negative. Fact Sheet for Patients:  StrictlyIdeas.no Fact Sheet for Healthcare Providers: BankingDealers.co.za This test is not yet approved or cleared by the [place] FDA and has been authorized for detection and/or diagnosis of SARS-CoV-2 by FDA under an Emergency Use Authorization (EUA).  This EUA will remain in effect (meaning this test can be used) for the duration of the COVID-19 declaration under Section 564(b)(1) of the Act, 21 U.S.C. section 360bbb-3(b)(1), unless the authorization is terminated or revoked sooner. Performed at [facility], [place] [address] [place], [place] [address]   Urinalysis, Routine w reflex microscopic     Status: None   Collection Time: 01/07/19  8:00 PM  Result Value Ref Range   Color, Urine YELLOW YELLOW   APPearance CLEAR CLEAR   Specific Gravity, Urine 1.011 1.005 - 1.030   pH 5.0 5.0 - 8.0   Glucose, UA NEGATIVE NEGATIVE mg/dL   Hgb urine dipstick NEGATIVE NEGATIVE   Bilirubin Urine NEGATIVE NEGATIVE   Ketones, ur NEGATIVE NEGATIVE mg/dL   Protein, ur NEGATIVE NEGATIVE mg/dL   Nitrite NEGATIVE NEGATIVE   Leukocytes,Ua NEGATIVE NEGATIVE    Comment: Performed at [facility] [address], [place], [place] [address]  Troponin I (High Sensitivity)     Status: None   Collection Time: 01/07/19   8:12 PM  Result Value Ref Range   Troponin I (High Sensitivity) 6 <18 ng/L    Comment: (NOTE) Elevated high sensitivity troponin I (hsTnI) values and significant  changes across serial measurements may suggest ACS but many other  chronic and acute conditions are known to elevate hsTnI results.  Refer to the "Links" section for chest pain algorithms and additional  guidance. Performed at [facility], [place] [address] [place], [place] [address]    Dg Chest Port 1 View  Result Date: 01/07/2019 CLINICAL DATA:  Chest pain. EXAM: PORTABLE CHEST 1 VIEW COMPARISON:  08/13/2018 and older exams. FINDINGS: There is opacity at the left lung base the wetting hemidiaphragm. This is likely atelectasis. Left hemidiaphragm is elevated. Small stable granuloma in the right upper lobe. Remainder of the lungs is clear. No convincing pleural effusion.  No pneumothorax. Changes from CABG surgery are noted. Cardiac silhouette normal in size. No mediastinal or hilar masses. Skeletal structures are grossly intact. IMPRESSION: 1. Left lung base opacity, is likely due to atelectasis. Consider pneumonia if there are consistent clinical findings. 2. No other evidence of acute cardiopulmonary disease. Electronically Signed   By: Lajean Manes M.D.   On: 01/07/2019 19:07    Pending Labs Unresulted Labs (From admission, onward)    Start     Ordered   01/07/19 1837  Urine Culture  ONCE - STAT,   STAT     01/07/19 1836   01/07/19 1824  Culture, blood (Routine X 2) w Reflex to ID Panel  BLOOD CULTURE X 2,   R     01/07/19 1823          Vitals/Pain Today's Vitals   01/07/19 1830 01/07/19 1936 01/07/19 2030 01/07/19 2031  BP: 140/67  134/75   Pulse: 95  84   Resp: (!) 23  20   Temp:      TempSrc:      SpO2: 96%  90%   Weight:      Height:      PainSc:  0-No pain  0-No pain    Isolation Precautions No active isolations  Medications Medications  cefTRIAXone (ROCEPHIN) 2 g in sodium chloride 0.9 % 100  mL IVPB (2 g Intravenous New Bag/Given 01/07/19 2013)  azithromycin (ZITHROMAX) 500 mg in sodium chloride 0.9 % 250 mL IVPB (500 mg Intravenous New Bag/Given 01/07/19 2014)  sodium chloride flush (NS) 0.9 % injection 3 mL (3 mLs Intravenous Given 01/07/19 2014)  acetaminophen (TYLENOL) tablet 650 mg (650 mg Oral Given 01/07/19 1916)    Mobility walks Low fall risk   Focused Assessments Cardiac Assessment Handoff:  Cardiac Rhythm: Normal sinus rhythm Lab Results  Component Value Date   TROPONINI <0.03 06/19/2018   Lab Results  Component Value Date   DDIMER (H) 04/07/2009    3.21        AT THE INHOUSE ESTABLISHED CUTOFF VALUE OF 0.48 ug/mL FEU, THIS ASSAY HAS BEEN DOCUMENTED IN THE LITERATURE TO HAVE A SENSITIVITY AND NEGATIVE PREDICTIVE VALUE OF AT LEAST 98 TO 99%.  THE TEST RESULT SHOULD BE CORRELATED WITH AN ASSESSMENT OF THE CLINICAL PROBABILITY OF DVT / VTE.   Does the Patient currently have chest pain? No     R Recommendations: See Admitting Provider Note  Report given to:   Additional Notes:

## 2019-01-07 NOTE — ED Triage Notes (Signed)
Pt arrives EMS from home with c/o non radiating chest pain since 4 pm. Pt also c/o groin pain. Given aspirin 324 ands nitro x1 with complete relief of chest pain.,20 g left fore arm.

## 2019-01-07 NOTE — Telephone Encounter (Signed)
Spoke to pt and advised that after speaking to [facility] they informed me that Rx was sent in to early. They stated it is currently being filled now. Pt notified of update. No further action needed.

## 2019-01-07 NOTE — Telephone Encounter (Signed)
diazepam (VALIUM) 5 MG tablet  Pt stated [employer] hasn't received his prescription.    Please fax to [phone]

## 2019-01-08 ENCOUNTER — Encounter: Payer: Medicare HMO | Admitting: Registered Nurse

## 2019-01-08 ENCOUNTER — Ambulatory Visit: Payer: Medicare HMO | Admitting: Thoracic Surgery (Cardiothoracic Vascular Surgery)

## 2019-01-08 DIAGNOSIS — J181 Lobar pneumonia, unspecified organism: Secondary | ICD-10-CM | POA: Diagnosis not present

## 2019-01-08 DIAGNOSIS — I10 Essential (primary) hypertension: Secondary | ICD-10-CM | POA: Diagnosis not present

## 2019-01-08 DIAGNOSIS — R6510 Systemic inflammatory response syndrome (SIRS) of non-infectious origin without acute organ dysfunction: Secondary | ICD-10-CM | POA: Diagnosis not present

## 2019-01-08 DIAGNOSIS — R079 Chest pain, unspecified: Secondary | ICD-10-CM

## 2019-01-08 DIAGNOSIS — N184 Chronic kidney disease, stage 4 (severe): Secondary | ICD-10-CM

## 2019-01-08 DIAGNOSIS — R072 Precordial pain: Secondary | ICD-10-CM

## 2019-01-08 LAB — BASIC METABOLIC PANEL
Anion gap: 11 (ref 5–15)
BUN: 30 mg/dL — ABNORMAL HIGH (ref 8–23)
CO2: 24 mmol/L (ref 22–32)
Calcium: 8.4 mg/dL — ABNORMAL LOW (ref 8.9–10.3)
Chloride: 102 mmol/L (ref 98–111)
Creatinine, Ser: 2.44 mg/dL — ABNORMAL HIGH (ref 0.61–1.24)
GFR calc Af Amer: 28 mL/min — ABNORMAL LOW (ref >60)
GFR calc non Af Amer: 24 mL/min — ABNORMAL LOW (ref >60)
Glucose, Bld: 121 mg/dL — ABNORMAL HIGH (ref 70–99)
Potassium: 3.9 mmol/L (ref 3.5–5.1)
Sodium: 137 mmol/L (ref 135–145)

## 2019-01-08 LAB — CBC
HCT: 29.5 % — ABNORMAL LOW (ref 39.0–52.0)
Hemoglobin: 10 g/dL — ABNORMAL LOW (ref 13.0–17.0)
MCH: 31.1 pg (ref 26.0–34.0)
MCHC: 33.9 g/dL (ref 30.0–36.0)
MCV: 91.6 fL (ref 80.0–100.0)
Platelets: 238 10*3/uL (ref 150–400)
RBC: 3.22 MIL/uL — ABNORMAL LOW (ref 4.22–5.81)
RDW: 13.6 % (ref 11.5–15.5)
WBC: 9.7 10*3/uL (ref 4.0–10.5)
nRBC: 0 % (ref 0.0–0.2)

## 2019-01-08 LAB — URINE CULTURE

## 2019-01-08 LAB — HIV ANTIBODY (ROUTINE TESTING W REFLEX): HIV Screen 4th Generation wRfx: NONREACTIVE

## 2019-01-08 MED ORDER — SODIUM CHLORIDE 0.9 % IV SOLN
INTRAVENOUS | Status: DC | PRN
  Administered 2019-01-08: 250 mL via INTRAVENOUS

## 2019-01-08 MED ORDER — METOPROLOL TARTRATE 25 MG PO TABS
25.0000 mg | ORAL_TABLET | Freq: Two times a day (BID) | ORAL | Status: DC
  Administered 2019-01-09: 25 mg via ORAL
  Filled 2019-01-08 (×2): qty 1

## 2019-01-08 MED ORDER — LINACLOTIDE 145 MCG PO CAPS
145.0000 ug | ORAL_CAPSULE | Freq: Every day | ORAL | Status: DC
  Administered 2019-01-09: 145 ug via ORAL
  Filled 2019-01-08: qty 1

## 2019-01-08 MED ORDER — VITAMIN B-12 1000 MCG PO TABS
1000.0000 ug | ORAL_TABLET | Freq: Every day | ORAL | Status: DC
  Administered 2019-01-08 – 2019-01-09 (×2): 1000 ug via ORAL
  Filled 2019-01-08 (×2): qty 1

## 2019-01-08 MED ORDER — OXYCODONE-ACETAMINOPHEN 7.5-325 MG PO TABS: 1.0000 | ORAL_TABLET | Freq: Three times a day (TID) | ORAL | Status: DC | PRN

## 2019-01-08 MED ORDER — FUROSEMIDE 20 MG PO TABS
20.0000 mg | ORAL_TABLET | Freq: Every day | ORAL | Status: DC
  Administered 2019-01-09: 20 mg via ORAL
  Filled 2019-01-08: qty 1

## 2019-01-08 MED ORDER — ALBUTEROL SULFATE HFA 108 (90 BASE) MCG/ACT IN AERS: 2.0000 | INHALATION_SPRAY | Freq: Two times a day (BID) | RESPIRATORY_TRACT | Status: DC | PRN

## 2019-01-08 MED ORDER — TIZANIDINE HCL 4 MG PO TABS
4.0000 mg | ORAL_TABLET | Freq: Every day | ORAL | Status: DC
  Administered 2019-01-08: 4 mg via ORAL
  Filled 2019-01-08: qty 1

## 2019-01-08 MED ORDER — MELATONIN 3 MG PO TABS
9.0000 mg | ORAL_TABLET | Freq: Every day | ORAL | Status: DC
  Administered 2019-01-08: 23:00:00 9 mg via ORAL
  Filled 2019-01-08 (×2): qty 3

## 2019-01-08 MED ORDER — TAMSULOSIN HCL 0.4 MG PO CAPS
0.4000 mg | ORAL_CAPSULE | Freq: Every day | ORAL | Status: DC
  Administered 2019-01-08 – 2019-01-09 (×2): 0.4 mg via ORAL
  Filled 2019-01-08 (×2): qty 1

## 2019-01-08 MED ORDER — ACETAMINOPHEN 325 MG PO TABS: 650.0000 mg | ORAL_TABLET | Freq: Four times a day (QID) | ORAL | Status: DC | PRN

## 2019-01-08 MED ORDER — DIAZEPAM 5 MG PO TABS: 2.5000 mg | ORAL_TABLET | Freq: Three times a day (TID) | ORAL | Status: DC | PRN

## 2019-01-08 MED ORDER — FENOFIBRATE 160 MG PO TABS
160.0000 mg | ORAL_TABLET | Freq: Every day | ORAL | Status: DC
  Administered 2019-01-08 – 2019-01-09 (×2): 160 mg via ORAL
  Filled 2019-01-08 (×2): qty 1

## 2019-01-08 MED ORDER — ASPIRIN EC 81 MG PO TBEC
81.0000 mg | DELAYED_RELEASE_TABLET | Freq: Every day | ORAL | Status: DC
  Administered 2019-01-08 – 2019-01-09 (×2): 81 mg via ORAL
  Filled 2019-01-08 (×2): qty 1

## 2019-01-08 MED ORDER — ALBUTEROL SULFATE (2.5 MG/3ML) 0.083% IN NEBU: 2.5000 mg | INHALATION_SOLUTION | Freq: Two times a day (BID) | RESPIRATORY_TRACT | Status: DC | PRN

## 2019-01-08 MED ORDER — GABAPENTIN 300 MG PO CAPS
300.0000 mg | ORAL_CAPSULE | Freq: Two times a day (BID) | ORAL | Status: DC
  Administered 2019-01-08 – 2019-01-09 (×2): 300 mg via ORAL
  Filled 2019-01-08 (×2): qty 1

## 2019-01-08 MED ORDER — ATORVASTATIN CALCIUM 10 MG PO TABS
20.0000 mg | ORAL_TABLET | Freq: Every day | ORAL | Status: DC
  Administered 2019-01-08 – 2019-01-09 (×2): 20 mg via ORAL
  Filled 2019-01-08 (×2): qty 2

## 2019-01-08 NOTE — Consult Note (Signed)
Cardiology Consultation:   Patient ID: Kevin [name] MRN: [id number]; DOB: 10/17/1940  Admit date: 01/07/2019 Date of Consult: 01/08/2019  Primary Care Provider: Marin Olp, MD Primary Cardiologist: Dorris Carnes, MD  Primary Electrophysiologist:  None    Patient Profile:   Kevin [name] is a 78 y.o. 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 Benny Lennert.  History of Present Illness:   Patient followed with vascular surgery, Dr. Oneida Alar for h/o of f/u for AAA s/p repair in 2010 and left CEA in 2006. In May 2017 patient underwent right CEA by Dr. Oneida Alar. Post procedure patient saw Dr. Baird Kay for blood pressure control and dizziness with the last office visit being October 2017.   Patient was admitted In January 2020 for chest pain. Echo showed EF 60-65%, G1DD,  left atrium mildly dilated, PA peak pressure 24 mm HG. LHC showed prox lesion 100% stenosed, left to right collaterals, 2nd margin lesion 80% stenosed, Prox LAD lesion 80% stenosed. Ost 1st diag to 1st diag lesion is 90% stenosed, LV end diastolic pressure normal. Due to the severe multivessel CAD patient had a cardiac surgery consult and subsequent CABG 06/27/18. Hospital course was complicated by intermittent atrial fibrillation. Patient was discharged with ASA, statin, and BB as well as amiodarone and coumadin for PAF. After discharge patient followed with Dr. Harrington Challenger. 5/22 visit amiodarone was stopped and coumadin was continued. At the following visit 7/16 coumadin was stopped. Patient was otherwise stable. Follow up with Dr. Roxan Hockey for CAD is scheduled for September.   Mr. Cauthon presented to the ER 8/10 for general complaint of  not feeling well. He said for the last couple days he was feeling chest discomfort, shortness of breath, and felt warm. Shortness of breath was progressive, worse with exertion, associated cough as well. He also admitted to intermittent nonexertional left sided chest pain for the last couple days. Pain is described as an ache 7/10 intensity. Denies associated dyspnea or diaphoresis. Pain also seems to be worse with eating. Patient called his PCP yesterday for the symptoms and they recommended he take 3 Aspirins and call EMS. When EMS arrived they administered NTG x 1. By the time patient arrived at the ER he was chest pain free. Patient feels this pain is not new and has been having intermittent chest ache since his surgery. Patient is not sure if this feels like similar pain he was having back in January before the CABG.   In the ED temp 100.9, pulse 105, RR 24, O2 96%, WBC 13.4, Lactic acid normal. COVID negative. Hgb 10. Blood cultures pending. UA negative for infection. HS trop 5>6. CXR showed left lung base opacity suspicious for PNA vs atelectasis. Patient was put on abx. EKG NSR, 98 bpm, old RBBB, no ST/T wave changes. Patient was admitted for further work-up.  On evaluation today patient says his chest discomfort is a 1/10.   Heart Pathway Score:     Past Medical History:  Diagnosis Date  . Arthritis    DDD- Lower Back  . Bradycardia   . CAP (community acquired pneumonia) 03/17/2015   "THAT'S WHAT THEY ARE THINKING; THEY ARE NOT SURE"  . Carotid artery occlusion   .  Chest pain 06/19/2018  . Chronic lower back pain    DDD  . CKD (chronic kidney disease)    Dr. Corliss Parish  . CKD (chronic kidney disease), stage III ([place]) 01/27/2014   GFR just above 30--> just below 30 11/10/14 refer to nephrology Creatinine 2 baseline but went up to 2.5 peak Likely RAS- lisinopril not great choice   . Diverticulosis   . Enlarged prostate    takes Flomax daily  . History of blood transfusion     "probably when they did aortic aneurysm"  . History of colon polyps    benign  . Hyperlipidemia   . Hypertension    takes Amlodipine and Zebeta daily  . Insomnia    takes Melatonin nightly  . Joint pain   . 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     . Muscle spasm    takes Zanaflex daily as needed  . Peripheral edema    takes Furosemide daily  . Pneumonia    hx of   . Rheumatic fever 1946  . Rheumatoid arthritis ([place])   . Short-term memory loss    minimal  . Shortness of breath dyspnea    with exertion but lasts very short period of time  . TIA (transient ischemic attack)    x 2;takes Plavix daily  . Urinary frequency    takes Flomax daily  . Urinary urgency     Past Surgical History:  Procedure Laterality Date  . ABDOMINAL AORTIC ANEURYSM REPAIR  2010  . CAROTID ENDARTERECTOMY Left 09/20/2004  . CATARACT EXTRACTION, BILATERAL     2016-2017  . COLONOSCOPY    . CORONARY ARTERY BYPASS GRAFT N/A 06/27/2018   Procedure: CORONARY ARTERY BYPASS GRAFTING (CABG), ON PUMP, TIMES FOUR, USING LEFT INTERNAL MAMMARY ARTERY AND ENDOSCOPICALLY HARVESTED LEFT GREATER SAPHENOUS VEIN;  Surgeon: Melrose Nakayama, MD;  Location: [facility];  Service: Open Heart Surgery;  Laterality: N/A;  . ENDARTERECTOMY Right 10/12/2015   Procedure: RIGHT CAROTID ENDARTERECTOMY WITH PATCH ANGIOPLASTY;  Surgeon: Elam Dutch, MD;  Location: [facility];  Service: Vascular;  Laterality: Right;  . INGUINAL HERNIA REPAIR Left 02/05/2013   Procedure: HERNIA REPAIR INGUINAL ADULT;  Surgeon: Joyice Faster. Cornett, MD;  Location: [facility];  Service: General;  Laterality: Left;  . INGUINAL HERNIA REPAIR Left   . INSERTION OF MESH Left 02/05/2013   Procedure: INSERTION OF MESH;  Surgeon: Joyice Faster. Cornett, MD;  Location: [facility];  Service: General;  Laterality: Left;  . LEFT HEART CATH AND CORONARY ANGIOGRAPHY N/A 06/21/2018   Procedure: LEFT HEART CATH AND CORONARY  ANGIOGRAPHY;  Surgeon: Jettie Booze, MD;  Location: [facility];  Service: Cardiovascular;  Laterality: N/A;  . SHOULDER ARTHROSCOPY WITH ROTATOR CUFF REPAIR AND SUBACROMIAL DECOMPRESSION Left 05/01/2014   Procedure: LEFT ARTHROSCOPY SHOULDER SUBACROMIAL DECOMPRESSION,DISTAL CLAVICAL RESECTION AND ROTATOR CUFF REPAIR;  Surgeon: Marin Shutter, MD;  Location: [facility];  Service: Orthopedics;  Laterality: Left;  . TEE WITHOUT CARDIOVERSION N/A 06/27/2018   Procedure: TRANSESOPHAGEAL ECHOCARDIOGRAM (TEE);  Surgeon: Melrose Nakayama, MD;  Location: [facility];  Service: Open Heart Surgery;  Laterality: N/A;  . TESTICLAR CYST EXCISION Left   . TONSILLECTOMY    . VASECTOMY       Home Medications:  Prior to Admission medications   Medication Sig Start Date End Date Taking? Authorizing Provider  albuterol (VENTOLIN HFA) 108 (90 Base) MCG/ACT inhaler Inhale 2 puffs into the lungs every  12 (twelve) hours as needed for wheezing or shortness of breath. 10/31/18  Yes Lyndal Pulley, DO  aspirin EC 81 MG EC tablet Take 1 tablet (81 mg total) by mouth daily. 07/05/18  Yes Barrett, Erin R, PA-C  atorvastatin (LIPITOR) 20 MG tablet TAKE 1 TABLET EVERY DAY 12/10/18  Yes Marin Olp, MD  BIOTIN PO Take 1 tablet by mouth daily.   Yes [provider]  Cholecalciferol (VITAMIN D-3) 1000 UNITS CAPS Take 1,000 Units by mouth daily.    Yes [provider]  diazepam (VALIUM) 5 MG tablet TAKE 1/2 TO 1 TABLET q8 hours PRN pain sleep/muscle spasm-only if wife is around+can watch you closely when taking due to sedation risk Patient taking differently: Take 2.5-5 mg by mouth See admin instructions. Take 2.5-5mg  every 8 hours as needed for pain sleep/muscle spasm-only if wife is around+can watch pt closely when taking due to sedation risk 01/03/19  Yes Marin Olp, MD  fenofibrate 160 MG tablet TAKE 1 TABLET EVERY DAY Patient taking differently: Take 160 mg by mouth daily.  01/07/19  Yes  Marin Olp, MD  furosemide (LASIX) 20 MG tablet Take 1 tablet (20 mg total) by mouth daily. 10/25/18  Yes Vivi Barrack, MD  gabapentin (NEURONTIN) 300 MG capsule Take 1 capsule (300 mg total) by mouth 2 (two) times daily. 08/02/18  Yes Marin Olp, MD  linaclotide [facility]) 145 MCG CAPS capsule Take 1 capsule (145 mcg total) by mouth daily before breakfast. 12/14/18  Yes Lyndal Pulley, DO  Melatonin 10 MG TABS Take 10 mg by mouth at bedtime.    Yes [provider]  metoprolol tartrate (LOPRESSOR) 25 MG tablet Take 25 mg by mouth 2 (two) times daily.    Yes [provider]  oxyCODONE-acetaminophen (PERCOCET) 7.5-325 MG tablet Take 1 tablet by mouth every 8 (eight) hours as needed for severe pain. 12/12/18 12/12/19 Yes Bayard Hugger, NP  tamsulosin (FLOMAX) 0.4 MG CAPS capsule TAKE 1 CAPSULE EVERY DAY Patient taking differently: Take 0.4 mg by mouth daily.  01/07/19  Yes Marin Olp, MD  tiZANidine (ZANAFLEX) 4 MG tablet TAKE 1 TABLET EVERY 6 HOURS AS NEEDED FOR MUSCLE SPASM(S) Patient taking differently: Take 4 mg by mouth at bedtime.  01/07/19  Yes Marin Olp, MD  vitamin B-12 (CYANOCOBALAMIN) 1000 MCG tablet Take 1,000 mcg by mouth daily.   Yes [provider]    Inpatient Medications: Scheduled Meds: . heparin  5,000 Units Subcutaneous Q8H   Continuous Infusions: . azithromycin Stopped (01/07/19 2114)  . cefTRIAXone (ROCEPHIN)  IV Stopped (01/07/19 2043)   PRN Meds: acetaminophen, oxyCODONE-acetaminophen, tiZANidine  Allergies:   No Known Allergies  Social History:   Social History   Socioeconomic History  . Marital status: Married    Spouse name: Not on file  . Number of children: Not on file  . Years of education: Not on file  . Highest education level: Not on file  Occupational History  . Occupation: retired    Comment: maintenance; electrician  Social Needs  . Financial resource strain: Not on file  . Food insecurity     Worry: Not on file    Inability: Not on file  . Transportation needs    Medical: Not on file    Non-medical: Not on file  Tobacco Use  . Smoking status: Former Smoker    Packs/day: 2.00    Years: 28.00    Pack years: 56.00  Types: Cigarettes    Quit date: 05/31/1987    Years since quitting: 31.6  . Smokeless tobacco: Never Used  . Tobacco comment: quit smoking "in my 14's"  Substance and Sexual Activity  . Alcohol use: Yes    Alcohol/week: 0.0 standard drinks    Comment: rare, mikes hard lemonade  . Drug use: No    Types: Marijuana    Comment: hx of-4-5 yrs ago  . Sexual activity: Not Currently  Lifestyle  . Physical activity    Days per week: Not on file    Minutes per session: Not on file  . Stress: Not on file  Relationships  . Social Herbalist on phone: Not on file    Gets together: Not on file    Attends religious service: Not on file    Active member of club or organization: Not on file    Attends meetings of clubs or organizations: Not on file    Relationship status: Not on file  . Intimate partner violence    Fear of current or ex partner: Not on file    Emotionally abused: Not on file    Physically abused: Not on file    Forced sexual activity: Not on file  Other Topics Concern  . Not on file  Social History Narrative   Married 33 years in 2015. Lived together for 12 years. 2 boys from first wife. 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       Hobbies: yardwork, [place] barn, woodwork, Orthoptist    Family History:    Family History  Problem Relation Age of Onset  . Parkinsonism Father   . Dementia Father   . Cancer Father        Throat  . Colon cancer Mother        died in 40s  . Cancer Mother        Colon     ROS:  Please see the history of present illness.  All other ROS reviewed and negative.     Physical  Exam/Data:   Vitals:   01/07/19 2100 01/07/19 2133 01/08/19 0533 01/08/19 1222  BP: 113/64 (!) 111/59 (!) 135/57 (!) 153/66  Pulse: 93 97 60 67  Resp: (!) 26 16 15 18   Temp:  99.1 F (37.3 C) 98.3 F (36.8 C) 98.3 F (36.8 C)  TempSrc:  Oral Oral Oral  SpO2: 94% 95%  96%  Weight:  80 kg 80.2 kg   Height:  5\' 7"  (1.702 m)      Intake/Output Summary (Last 24 hours) at 01/08/2019 1455 Last data filed at 01/08/2019 1200 Gross per 24 hour  Intake 590 ml  Output 700 ml  Net -110 ml   Last 3 Weights 01/08/2019 01/07/2019 01/07/2019  Weight (lbs) 176 lb 12.8 oz 176 lb 6.4 oz 180 lb  Weight (kg) 80.196 kg 80.015 kg 81.647 kg     Body mass index is 27.69 kg/m.  General:  Well nourished, well developed, in no acute distress HEENT: normal Lymph: no adenopathy Neck: no JVD Endocrine:  No thryomegaly Vascular: No carotid bruits; FA pulses 2+ bilaterally without bruits  Cardiac:  normal S1, S2; RRR; systolic murmur  Lungs:  clear to auscultation bilaterally, no wheezing, rhonchi or rales  Abd: soft, nontender, no hepatomegaly  Ext: no edema Musculoskeletal:  No deformities, BUE and BLE strength normal and equal Skin: warm and dry  Neuro:  CNs 2-12 intact, no focal abnormalities noted Psych:  Normal affect   EKG:  The EKG was personally reviewed and demonstrates:  NSR, 98 bpm, old RBBB, poor r wave progression, LAD Telemetry:  Telemetry was personally reviewed and demonstrates:  NSR, rates 60-70s, possibly 1 event of first degree AV block (PRI 0.23)  Relevant CV Studies: LHC 06/21/18  Prox RCA lesion is 100% stenosed. Left to right collaterals.  2nd Mrg lesion is 80% stenosed.  Ost 1st Diag to 1st Diag lesion is 90% stenosed.  Prox LAD lesion is 80% stenosed.  LV end diastolic pressure is normal.  There is no aortic valve stenosis.   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  Severe disease in a large diagonal.  Eccentric lesion in an OM, best seen  in the AP caudal view.  Chronically occluded dominant RCA.   Echo 122/20 Study Conclusions - Left ventricle: The cavity size was normal. There was mild   concentric hypertrophy. Systolic function was normal. The   estimated ejection fraction was in the range of 60% to 65%. Wall   motion was normal; there were no regional wall motion   abnormalities. Doppler parameters are consistent with abnormal   left ventricular relaxation (grade 1 diastolic dysfunction).   Doppler parameters are consistent with high ventricular filling   pressure. - Aortic valve: Transvalvular velocity was within the normal range.   There was no stenosis. There was no regurgitation. Valve area   (VTI): 2.17 cm^2. Valve area (Vmean): 2.45 cm^2. - Mitral valve: Mildly calcified annulus. Transvalvular velocity   was within the normal range. There was no evidence for stenosis.   There was no regurgitation. - Left atrium: The atrium was mildly dilated. - Right ventricle: The cavity size was normal. Wall thickness was   normal. Systolic function was normal. - Tricuspid valve: There was trivial regurgitation. - Pulmonary arteries: Systolic pressure was within the normal   range. PA peak pressure: 24 mm Hg (S).  Laboratory Data:  High Sensitivity Troponin:   Recent Labs  Lab 01/07/19 1820 01/07/19 2012  TROPONINIHS 5 6     Cardiac EnzymesNo results for input(s): TROPONINI in the last 168 hours. No results for input(s): TROPIPOC in the last 168 hours.  Chemistry Recent Labs  Lab 01/07/19 1820 01/08/19 0357  NA 135 137  K 3.8 3.9  CL 101 102  CO2 22 24  GLUCOSE 138* 121*  BUN 32* 30*  CREATININE 2.64* 2.44*  CALCIUM 8.7* 8.4*  GFRNONAA 22* 24*  GFRAA 25* 28*  ANIONGAP 12 11    No results for input(s): PROT, ALBUMIN, AST, ALT, ALKPHOS, BILITOT in the last 168 hours. Hematology Recent Labs  Lab 01/07/19 1820 01/08/19 0357  WBC 13.4* 9.7  RBC 3.81* 3.22*  HGB 11.6* 10.0*  HCT 35.8* 29.5*  MCV 94.0  91.6  MCH 30.4 31.1  MCHC 32.4 33.9  RDW 13.9 13.6  PLT 308 238   BNPNo results for input(s): BNP, PROBNP in the last 168 hours.  DDimer No results for input(s): DDIMER in the last 168 hours.   Radiology/Studies:  Dg Chest Port 1 View  Result Date: 01/07/2019 CLINICAL DATA:  Chest pain. EXAM: PORTABLE CHEST 1 VIEW COMPARISON:  08/13/2018 and older exams. FINDINGS: There is opacity at the left lung base the wetting hemidiaphragm. This is likely atelectasis. Left hemidiaphragm is elevated. Small stable granuloma in  the right upper lobe. Remainder of the lungs is clear. No convincing pleural effusion.  No pneumothorax. Changes from CABG surgery are noted. Cardiac silhouette normal in size. No mediastinal or hilar masses. Skeletal structures are grossly intact. IMPRESSION: 1. Left lung base opacity, is likely due to atelectasis. Consider pneumonia if there are consistent clinical findings. 2. No other evidence of acute cardiopulmonary disease. Electronically Signed   By: Lajean Manes M.D.   On: 01/07/2019 19:07    Assessment and Plan:   Atypical Chest pain/ CAD s/p CABG January 2020 Patient presented to the ER for intermittent chest discomfort and progressive SOB for the last couple days. Chest discomfort is an ache on the left side, not worse with exertion. Believes ASA and NTG did help pain yesterday. Chest discomfort also worse with eating.  - In the ER CXR suspicious for PNA. WBC elevated and febrile. - HS trop 5 > 6 - EKG without ischemic changes - Continue cardiac monitoring - Continue Home meds: ASA, Atorvastatin 20 mg daily, Metoprolol 25 mg BID - Chest discomfort likely 2/2 to PNA. Low suspicion of ACS. Patient notes that since his surgery in January he has intermittent days of aching chest discomfort. He does not feel his pain is new or different just worsened. Pain yesterday was 7/10. Today pain is improved 1/10.   CAP - Febrile on admission. Elevated WBC. Not hypoxic - CXR  notable for PNA  - Abx per IM - Suspect SOB and chest discomfort will improve with treatment of PNA  Chronic diastolic HF - Home meds Lasix 20 mg daily metoprolol 25 mg BID - Echo 05/2018 EF 60-65% G1DD, left atrium mildly dilated, trivial TR, PA peak 24 mmHG - Does not appear to be in acute exacerbation  HTN - Systolics were in the 654Y and antihypertensives were held - BP slightly elevated over the last 24 hours - Consider restarting antihypertensives if remains elevated  CKD Stage IV - Stable at baseline - Creatinine 2.6 > 2.44  HLD - Atorvastatin 20mg  daily and fenofibrate 160 daily - LDL 55 06/20/2018    For questions or updates, please contact [facility] Please consult [email or web address] for contact info under     Signed, Aela Bohan Ninfa Meeker, PA-C  01/08/2019 2:55 PM

## 2019-01-08 NOTE — Progress Notes (Signed)
Pt asking about regular diet as he says he does not eat very much and will not eat heart healthy diet, also asking about his meds and will page MD, no attending assigned, paged admitting to clarify so can page

## 2019-01-08 NOTE — Progress Notes (Signed)
PROGRESS NOTE  Kevin [name] FAO:[id number] DOB: 1941/02/25 DOA: 01/07/2019 PCP: Kevin Olp, MD  Brief History    Kevin [name] is a 78 y.o. 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  Patient states for the past few days he has been feeling warm.  He has been experiencing shortness of breath and coughing up clear/yellowish sputum.  He is also having intermittent, nonexertional left-sided chest pain for the past few days.  He describes it as aching, 2-4 out of 10 in intensity.  Chest pain is not associated with dyspnea or diaphoresis.  Not complaining of chest pain at present.  No other complaints.  ED Course: Temperature 100.9 F.  Pulse 105 and respiratory rate 24 on arrival.  SPO2 96 to 100% on room air.  White count 13.4.  Lactic acid normal.  COVID-19 rapid test negative.  Blood culture x2 pending.  UA and urine culture pending.  Initial high-sensitivity troponin negative, repeat pending.  Chest x-ray showing left lung base opacity suspicious for pneumonia versus atelectasis. Patient received Tylenol, ceftriaxone, and azithromycin in the ED.  Consultants  . Cardiology  Procedures  . None  Antibiotics   Anti-infectives (From admission, onward)   Start     Dose/Rate Route Frequency Ordered Stop   01/07/19 1930  cefTRIAXone (ROCEPHIN) 2 g in sodium chloride 0.9 % 100 mL IVPB     2 g 200 mL/hr over 30 Minutes Intravenous Every 24 hours 01/07/19 1918     01/07/19 1930  azithromycin (ZITHROMAX) 500 mg in sodium chloride 0.9 % 250 mL IVPB     500 mg 250 mL/hr over 60 Minutes Intravenous Every 24 hours 01/07/19 1918      .  Subjective  The patient is resting comfortably. No new complaints.   Objective   Vitals:  Vitals:   01/08/19 1222 01/08/19 1720  BP: (!) 153/66 (!) 160/75  Pulse: 67 76  Resp: 18 18  Temp: 98.3 F (36.8 C) 98.3 F (36.8 C)  SpO2: 96% 95%    Exam:  Constitutional:  . The patient is awake, alert, and oriented x 3. No acute distress. Respiratory:  . No increased work of breathing. . No wheezes, rales, or rhonchi . No tactile fremitus. Cardiovascular:  . Regular rate and rhythm. . No murmurs, ectopy, or gallups. . No lateral PMI, No thrills. Abdomen:  . Abdomen is soft, non-tender, non-distended. . No hernias, masses, or organomegaly. . Normoactive bowel sounds.  Musculoskeletal:  . No cyanosis, clubbing, or edema Skin:  . No rashes, lesions, ulcers . palpation of skin: no induration or nodules Neurologic:  . CN 2-12 intact . Sensation all 4 extremities intact Psychiatric:  . Mental status o Mood, affect appropriate o Orientation to person, place, time  . judgment and insight appear intact  I have personally reviewed the following:   Today's Data  Vitals, CBC, BMP, procalcitonin negative.  Micro Data  . Blood culture, Urine culture  Scheduled Meds: . heparin  5,000 Units Subcutaneous Q8H   Continuous Infusions: . azithromycin Stopped (01/07/19 2114)  . cefTRIAXone (ROCEPHIN)  IV Stopped (01/07/19 2043)    Principal Problem:   CAP (community acquired pneumonia) Active Problems:   Essential hypertension   CKD (chronic kidney  disease), stage IV ([place])   SIRS (systemic inflammatory response syndrome) ([facility])   Chest pain   LOS: 0 days   A & P   CAP, SIRS Febrile. Slightly tachycardic and tachypneic on arrival.  Not hypoxic.  Labs suggestive of mild leukocytosis.  Lactic acid normal. COVID-19 rapid test negative. Chest x-ray showing left lung base opacity suspicious for pneumonia versus atelectasis. The patient will be continued on ceftriaxone and azithromycin. He will be continued on  supplemental oxygen as needed. Will  discontinue continuous Oxygen saturations  Atypical chest pain, history of severe multivessel CAD status post CABG in January 2020 High-sensitivity troponin checked twice negative.  EKG without acute ischemic changes.  Currently chest pain-free. Monitor on telemetry.  Hypertension: Systolic currently in 248G.  Hold antihypertensives at this time.  Chronic back pain: Continue home oxycodone-acetaminophen, tizanidine.  CKD stage IV: Stable. Creatinine 2.6, at baseline.  I have seen and examined this patient myself. I have spent 35 minutes in his evaluation and care.  DVT prophylaxis: Subcutaneous heparin Code Status: Discussed with both patient and his wife at bedside.  Patient wishes to be DNR. Family Communication: Wife updated Disposition Plan: Anticipate discharge after clinical improvement.  Kevin Auxier, DO [facility] Direct contact: see [email or web address]  7PM-7AM contact night coverage as above 01/08/2019, 5:30 PM  LOS: 0 days

## 2019-01-09 DIAGNOSIS — R6510 Systemic inflammatory response syndrome (SIRS) of non-infectious origin without acute organ dysfunction: Secondary | ICD-10-CM | POA: Diagnosis not present

## 2019-01-09 DIAGNOSIS — Z66 Do not resuscitate: Secondary | ICD-10-CM | POA: Diagnosis not present

## 2019-01-09 DIAGNOSIS — I10 Essential (primary) hypertension: Secondary | ICD-10-CM | POA: Diagnosis not present

## 2019-01-09 DIAGNOSIS — G894 Chronic pain syndrome: Secondary | ICD-10-CM | POA: Diagnosis not present

## 2019-01-09 DIAGNOSIS — R072 Precordial pain: Secondary | ICD-10-CM | POA: Diagnosis not present

## 2019-01-09 DIAGNOSIS — N184 Chronic kidney disease, stage 4 (severe): Secondary | ICD-10-CM | POA: Diagnosis not present

## 2019-01-09 DIAGNOSIS — J189 Pneumonia, unspecified organism: Secondary | ICD-10-CM | POA: Diagnosis not present

## 2019-01-09 DIAGNOSIS — Z20828 Contact with and (suspected) exposure to other viral communicable diseases: Secondary | ICD-10-CM | POA: Diagnosis not present

## 2019-01-09 DIAGNOSIS — I5032 Chronic diastolic (congestive) heart failure: Secondary | ICD-10-CM | POA: Diagnosis not present

## 2019-01-09 DIAGNOSIS — I130 Hypertensive heart and chronic kidney disease with heart failure and stage 1 through stage 4 chronic kidney disease, or unspecified chronic kidney disease: Secondary | ICD-10-CM | POA: Diagnosis not present

## 2019-01-09 DIAGNOSIS — I2510 Atherosclerotic heart disease of native coronary artery without angina pectoris: Secondary | ICD-10-CM | POA: Diagnosis not present

## 2019-01-09 DIAGNOSIS — M545 Low back pain: Secondary | ICD-10-CM | POA: Diagnosis not present

## 2019-01-09 DIAGNOSIS — J181 Lobar pneumonia, unspecified organism: Secondary | ICD-10-CM | POA: Diagnosis not present

## 2019-01-09 LAB — CBC WITH DIFFERENTIAL/PLATELET
Abs Immature Granulocytes: 0.05 10*3/uL (ref 0.00–0.07)
Basophils Absolute: 0.1 10*3/uL (ref 0.0–0.1)
Basophils Relative: 1 %
Eosinophils Absolute: 0.4 10*3/uL (ref 0.0–0.5)
Eosinophils Relative: 5 %
HCT: 31.8 % — ABNORMAL LOW (ref 39.0–52.0)
Hemoglobin: 10.6 g/dL — ABNORMAL LOW (ref 13.0–17.0)
Immature Granulocytes: 1 %
Lymphocytes Relative: 37 %
Lymphs Abs: 2.8 10*3/uL (ref 0.7–4.0)
MCH: 30.6 pg (ref 26.0–34.0)
MCHC: 33.3 g/dL (ref 30.0–36.0)
MCV: 91.9 fL (ref 80.0–100.0)
Monocytes Absolute: 0.7 10*3/uL (ref 0.1–1.0)
Monocytes Relative: 9 %
Neutro Abs: 3.7 10*3/uL (ref 1.7–7.7)
Neutrophils Relative %: 47 %
Platelets: 264 10*3/uL (ref 150–400)
RBC: 3.46 MIL/uL — ABNORMAL LOW (ref 4.22–5.81)
RDW: 13.5 % (ref 11.5–15.5)
WBC: 7.6 10*3/uL (ref 4.0–10.5)
nRBC: 0 % (ref 0.0–0.2)

## 2019-01-09 LAB — BASIC METABOLIC PANEL
Anion gap: 11 (ref 5–15)
BUN: 26 mg/dL — ABNORMAL HIGH (ref 8–23)
CO2: 24 mmol/L (ref 22–32)
Calcium: 9 mg/dL (ref 8.9–10.3)
Chloride: 101 mmol/L (ref 98–111)
Creatinine, Ser: 2.01 mg/dL — ABNORMAL HIGH (ref 0.61–1.24)
GFR calc Af Amer: 35 mL/min — ABNORMAL LOW (ref >60)
GFR calc non Af Amer: 30 mL/min — ABNORMAL LOW (ref >60)
Glucose, Bld: 113 mg/dL — ABNORMAL HIGH (ref 70–99)
Potassium: 3.9 mmol/L (ref 3.5–5.1)
Sodium: 136 mmol/L (ref 135–145)

## 2019-01-09 MED ORDER — AMOXICILLIN-POT CLAVULANATE 875-125 MG PO TABS: 1.0000 | ORAL_TABLET | Freq: Two times a day (BID) | ORAL | 0 refills | Status: AC

## 2019-01-09 MED ORDER — AZITHROMYCIN 500 MG PO TABS: 500.0000 mg | ORAL_TABLET | Freq: Every day | ORAL | 0 refills | Status: AC

## 2019-01-09 NOTE — Progress Notes (Signed)
CCMD called to state irregular rhythm, appears afib, ekd done and confirmed afib, pt asx denies cp sob or dizziness, bp good, paged Dr Benny Lennert, , will give po lopressor now and monitior

## 2019-01-09 NOTE — Progress Notes (Signed)
Pt has gone in and out of sr /afib, pt currently afib rate 90's, pt asx, Dr Benny Lennert making rounds and informed

## 2019-01-09 NOTE — Discharge Summary (Signed)
Physician Discharge Summary  Kevin [name] ZOX:[id number] DOB: 1941-03-05 DOA: 01/07/2019  PCP: Marin Olp, MD  Admit date: 01/07/2019 Discharge date: 01/09/2019  Recommendations for Outpatient Follow-up:  1. Follow up with PCP in 7-10 days.  Discharge Diagnoses: Principal diagnosis is #1 1. Community acquired pneumonia 2. Atypical chest pain. Likely pleuritic. 3. Hypertension 4. Chronic back pain 5. CKD IV.  Discharge Condition: Fair Disposition: Home  Diet recommendation: Heart healthy  Filed Weights   01/07/19 2133 01/08/19 0533 01/09/19 0516  Weight: 80 kg 80.2 kg 79.5 kg    History of present illness:   Kevin [name] is a 78 y.o. 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  Patient states for the past few days he has been feeling warm.  He has been experiencing shortness of breath and coughing up clear/yellowish sputum.  He is also having intermittent, nonexertional left-sided chest pain for the past few days.  He describes it as aching, 2-4 out of 10 in intensity.  Chest pain is not associated with dyspnea or diaphoresis.  Not complaining of chest pain at present.  No other complaints.  ED Course: Temperature 100.9 F.  Pulse 105 and respiratory rate 24 on arrival.  SPO2 96 to 100% on room air.  White count 13.4.  Lactic acid normal.  COVID-19 rapid test negative.  Blood culture x2 pending.  UA and urine culture pending.  Initial high-sensitivity troponin negative, repeat pending.  Chest x-ray showing left lung base opacity suspicious for pneumonia versus atelectasis. Patient received Tylenol, ceftriaxone, and azithromycin.  Hospital Course:  The patient was admitted to a telemetry  bed. He was ruled out for MI by EKG and enzyme criteria. He has received IV ceftriaxone and azithromycin. He is saturating in the nineties on room air today at rest and with ambulation. He will be discharged to home on oral antibiotics.  Today's assessment: S: The patient is resting comfortably. No new complaints. O: Vitals:  Vitals:   01/09/19 0516 01/09/19 0744  BP: 139/64 135/89  Pulse: (!) 55 (!) 127  Resp: 18 16  Temp: 98.3 F (36.8 C) 97.9 F (36.6 C)  SpO2: 96% 98%    Constitutional:  . The patient is awake, alert, and oriented x 3.  Respiratory:  . There is no increased work of breathing. . No wheezes, rales, or rhonchi. . No tactile fremitus. Cardiovascular:  . Regular rate and rhythm. . No murmurs, ectopy, or gallups.  . No lateral PMI. No thrills. Abdomen:  . Abdomen is soft, non-tender, non-distended. . No hernias, masses, or organomegaly. . Normoactive bowel sounds.  Musculoskeletal:  . No cyanosis, clubbing, or edema Skin:  . No rashes, lesions, ulcers . palpation of skin: no induration or nodules Neurologic:  . CN 2-12 intact . Sensation all 4 extremities intact Psychiatric:  . judgement and insight appear normal . Mental status o Mood, affect appropriate o Orientation to person, place, time   Discharge Instructions  Discharge Instructions    Activity as tolerated - No restrictions   Complete by: As directed    Call MD for:  difficulty breathing, headache or visual disturbances   Complete by: As directed    Call MD for:  persistant dizziness or light-headedness  Complete by: As directed    Diet - low sodium heart healthy   Complete by: As directed    Discharge instructions   Complete by: As directed    Follow up with PCP in 7-10 days.   Increase activity slowly   Complete by: As directed      Allergies as of 01/09/2019   No Known Allergies     Medication List    TAKE these medications   albuterol 108 (90 Base) MCG/ACT inhaler  Commonly known as: VENTOLIN HFA Inhale 2 puffs into the lungs every 12 (twelve) hours as needed for wheezing or shortness of breath.   amoxicillin-clavulanate 875-125 MG tablet Commonly known as: Augmentin Take 1 tablet by mouth 2 (two) times daily for 7 days.   aspirin 81 MG EC tablet Take 1 tablet (81 mg total) by mouth daily.   atorvastatin 20 MG tablet Commonly known as: LIPITOR TAKE 1 TABLET EVERY DAY   azithromycin 500 MG tablet Commonly known as: Zithromax Take 1 tablet (500 mg total) by mouth daily for 3 days. Take 1 tablet daily for 3 days.   BIOTIN PO Take 1 tablet by mouth daily.   diazepam 5 MG tablet Commonly known as: VALIUM TAKE 1/2 TO 1 TABLET q8 hours PRN pain sleep/muscle spasm-only if wife is around+can watch you closely when taking due to sedation risk What changed:   how much to take  how to take this  when to take this  additional instructions   fenofibrate 160 MG tablet TAKE 1 TABLET EVERY DAY   furosemide 20 MG tablet Commonly known as: LASIX Take 1 tablet (20 mg total) by mouth daily.   gabapentin 300 MG capsule Commonly known as: NEURONTIN Take 1 capsule (300 mg total) by mouth 2 (two) times daily.   linaclotide 145 MCG Caps capsule Commonly known as: Linzess Take 1 capsule (145 mcg total) by mouth daily before breakfast.   Melatonin 10 MG Tabs Take 10 mg by mouth at bedtime.   metoprolol tartrate 25 MG tablet Commonly known as: LOPRESSOR Take 25 mg by mouth 2 (two) times daily.   oxyCODONE-acetaminophen 7.5-325 MG tablet Commonly known as: Percocet Take 1 tablet by mouth every 8 (eight) hours as needed for severe pain.   tamsulosin 0.4 MG Caps capsule Commonly known as: FLOMAX TAKE 1 CAPSULE EVERY DAY   tiZANidine 4 MG tablet Commonly known as: ZANAFLEX TAKE 1 TABLET EVERY 6 HOURS AS NEEDED FOR MUSCLE SPASM(S) What changed: See the new instructions.   vitamin B-12 1000 MCG tablet Commonly known as: CYANOCOBALAMIN Take  1,000 mcg by mouth daily.   Vitamin D-3 25 MCG (1000 UT) Caps Take 1,000 Units by mouth daily.      No Known Allergies  The results of significant diagnostics from this hospitalization (including imaging, microbiology, ancillary and laboratory) are listed below for reference.    Significant Diagnostic Studies: Dg Chest Port 1 View  Result Date: 01/07/2019 CLINICAL DATA:  Chest pain. EXAM: PORTABLE CHEST 1 VIEW COMPARISON:  08/13/2018 and older exams. FINDINGS: There is opacity at the left lung base the wetting hemidiaphragm. This is likely atelectasis. Left hemidiaphragm is elevated. Small stable granuloma in the right upper lobe. Remainder of the lungs is clear. No convincing pleural effusion.  No pneumothorax. Changes from CABG surgery are noted. Cardiac silhouette normal in size. No mediastinal or hilar masses. Skeletal structures are grossly intact. IMPRESSION: 1. Left lung base opacity, is likely due to atelectasis. Consider pneumonia if there are  consistent clinical findings. 2. No other evidence of acute cardiopulmonary disease. Electronically Signed   By: Lajean Manes M.D.   On: 01/07/2019 19:07    Microbiology: Recent Results (from the past 240 hour(s))  SARS Coronavirus 2 [facility] order, Performed in [facility] hospital lab) Nasopharyngeal Nasopharyngeal Swab     Status: None   Collection Time: 01/07/19  6:25 PM   Specimen: Nasopharyngeal Swab  Result Value Ref Range Status   SARS Coronavirus 2 NEGATIVE NEGATIVE Final    Comment: (NOTE) If result is NEGATIVE SARS-CoV-2 target nucleic acids are NOT DETECTED. The SARS-CoV-2 RNA is generally detectable in upper and lower  respiratory specimens during the acute phase of infection. The lowest  concentration of SARS-CoV-2 viral copies this assay can detect is 250  copies / mL. A negative result does not preclude SARS-CoV-2 infection  and should not be used as the sole basis for treatment or other  patient management decisions.   A negative result may occur with  improper specimen collection / handling, submission of specimen other  than nasopharyngeal swab, presence of viral mutation(s) within the  areas targeted by this assay, and inadequate number of viral copies  (<250 copies / mL). A negative result must be combined with clinical  observations, patient history, and epidemiological information. If result is POSITIVE SARS-CoV-2 target nucleic acids are DETECTED. The SARS-CoV-2 RNA is generally detectable in upper and lower  respiratory specimens dur ing the acute phase of infection.  Positive  results are indicative of active infection with SARS-CoV-2.  Clinical  correlation with patient history and other diagnostic information is  necessary to determine patient infection status.  Positive results do  not rule out bacterial infection or co-infection with other viruses. If result is PRESUMPTIVE POSTIVE SARS-CoV-2 nucleic acids MAY BE PRESENT.   A presumptive positive result was obtained on the submitted specimen  and confirmed on repeat testing.  While 2019 novel coronavirus  (SARS-CoV-2) nucleic acids may be present in the submitted sample  additional confirmatory testing may be necessary for epidemiological  and / or clinical management purposes  to differentiate between  SARS-CoV-2 and other Sarbecovirus currently known to infect humans.  If clinically indicated additional testing with an alternate test  methodology [phone]) is advised. The SARS-CoV-2 RNA is generally  detectable in upper and lower respiratory sp ecimens during the acute  phase of infection. The expected result is Negative. Fact Sheet for Patients:  StrictlyIdeas.no Fact Sheet for Healthcare Providers: BankingDealers.co.za This test is not yet approved or cleared by the [place] FDA and has been authorized for detection and/or diagnosis of SARS-CoV-2 by FDA under an Emergency Use  Authorization (EUA).  This EUA will remain in effect (meaning this test can be used) for the duration of the COVID-19 declaration under Section 564(b)(1) of the Act, 21 U.S.C. section 360bbb-3(b)(1), unless the authorization is terminated or revoked sooner. Performed at [facility], [place] [address] [place], [place] [address]   Culture, blood (Routine X 2) w Reflex to ID Panel     Status: None (Preliminary result)   Collection Time: 01/07/19  7:51 PM   Specimen: BLOOD RIGHT FOREARM  Result Value Ref Range Status   Specimen Description BLOOD RIGHT FOREARM  Final   Special Requests   Final    BOTTLES DRAWN AEROBIC AND ANAEROBIC Blood Culture results may not be optimal due to an inadequate volume of blood received in culture bottles   Culture   Final  NO GROWTH 2 DAYS Performed at [facility], [place] [address], [place], [place] [address]    Report Status PENDING  Incomplete  Culture, blood (Routine X 2) w Reflex to ID Panel     Status: None (Preliminary result)   Collection Time: 01/07/19  7:52 PM   Specimen: BLOOD  Result Value Ref Range Status   Specimen Description BLOOD RIGHT ANTECUBITAL  Final   Special Requests   Final    BOTTLES DRAWN AEROBIC AND ANAEROBIC Blood Culture results may not be optimal due to an inadequate volume of blood received in culture bottles   Culture   Final    NO GROWTH 2 DAYS Performed at [facility], [place] [address], [place], [place] [address]    Report Status PENDING  Incomplete  Urine Culture     Status: Abnormal   Collection Time: 01/07/19  7:53 PM   Specimen: Urine, Random  Result Value Ref Range Status   Specimen Description URINE, RANDOM  Final   Special Requests   Final    NONE Performed at [facility], [place] [address] [place], [place] [address]    Culture MULTIPLE SPECIES PRESENT, SUGGEST RECOLLECTION (A)  Final   Report Status 01/08/2019 FINAL  Final     Labs: Basic Metabolic Panel: Recent Labs  Lab 01/07/19  1820 01/08/19 0357 01/09/19 0446  NA 135 137 136  K 3.8 3.9 3.9  CL 101 102 101  CO2 22 24 24   GLUCOSE 138* 121* 113*  BUN 32* 30* 26*  CREATININE 2.64* 2.44* 2.01*  CALCIUM 8.7* 8.4* 9.0   Liver Function Tests: No results for input(s): AST, ALT, ALKPHOS, BILITOT, PROT, ALBUMIN in the last 168 hours. No results for input(s): LIPASE, AMYLASE in the last 168 hours. No results for input(s): AMMONIA in the last 168 hours. CBC: Recent Labs  Lab 01/07/19 1820 01/08/19 0357 01/09/19 0446  WBC 13.4* 9.7 7.6  NEUTROABS  --   --  3.7  HGB 11.6* 10.0* 10.6*  HCT 35.8* 29.5* 31.8*  MCV 94.0 91.6 91.9  PLT 308 238 264   Cardiac Enzymes: No results for input(s): CKTOTAL, CKMB, CKMBINDEX, TROPONINI in the last 168 hours. BNP: BNP (last 3 results) No results for input(s): BNP in the last 8760 hours.  ProBNP (last 3 results) No results for input(s): PROBNP in the last 8760 hours.  CBG: No results for input(s): GLUCAP in the last 168 hours.  Principal Problem:   CAP (community acquired pneumonia) Active Problems:   Essential hypertension   CKD (chronic kidney disease), stage IV (HCC)   SIRS (systemic inflammatory response syndrome) (HCC)   Chest pain   Time coordinating discharge: 38 minutes.  Signed:        Nazaria Riesen, DO [facility]  01/09/2019, 2:53 PM

## 2019-01-09 NOTE — Progress Notes (Signed)
Resting HR SR  64 ra sat 98%  pt ambulated to desk SR 74 ra sat 98%, denies cp or sob, only limited due to arthritic pain in knees

## 2019-01-10 ENCOUNTER — Telehealth: Payer: Self-pay

## 2019-01-10 NOTE — Telephone Encounter (Signed)
LM for patient to return call for TCM. 

## 2019-01-12 LAB — CULTURE, BLOOD (ROUTINE X 2)
Culture: NO GROWTH
Culture: NO GROWTH

## 2019-01-12 NOTE — Progress Notes (Signed)
[name] [place], [place] [address] Phone: [phone] Subjective:   I Kevin [name] am serving as a Education administrator for Dr. Hulan Saas.   CC:   Bilateral knee pain  [id number]  Kevin [name] is a 78 y.o. male coming in with complaint of back pain. States he has been doing well. Bilateral knee pain. History of arthritis. Right worse than left today. Seen by Dr. Paulla Fore.   Onset- Chronic Location- medial knee pain  Character- dull aching pain.  Aggravating factors- walking, pivot  Severity- 7/10     Past Medical History:  Diagnosis Date  . Arthritis    DDD- Lower Back  . Bradycardia   . CAP (community acquired pneumonia) 03/17/2015   "THAT'S WHAT THEY ARE THINKING; THEY ARE NOT SURE"  . Carotid artery occlusion   . Chest pain 06/19/2018  . Chronic lower back pain    DDD  . CKD (chronic kidney disease)    Dr. Corliss Parish  . CKD (chronic kidney disease), stage III ([place]) 01/27/2014   GFR just above 30--> just below 30 11/10/14 refer to nephrology Creatinine 2 baseline but went up to 2.5 peak Likely RAS- lisinopril not great choice   . Diverticulosis   . Enlarged prostate    takes Flomax daily  . History of blood transfusion    "probably when they did aortic aneurysm"  . History of colon polyps    benign  . Hyperlipidemia   . Hypertension    takes Amlodipine and Zebeta daily  . Insomnia    takes Melatonin nightly  . Joint pain   . 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     . Muscle spasm    takes Zanaflex daily as needed  . Peripheral edema    takes Furosemide daily  . Pneumonia    hx of   . Rheumatic fever 1946  . Rheumatoid arthritis ([place])   . Short-term memory loss    minimal  . Shortness of breath dyspnea    with exertion but lasts very short period of time  . TIA (transient ischemic attack)    x 2;takes Plavix daily  . Urinary frequency    takes Flomax daily  . Urinary urgency     Past Surgical History:  Procedure Laterality Date  . ABDOMINAL AORTIC ANEURYSM REPAIR  2010  . CAROTID ENDARTERECTOMY Left 09/20/2004  . CATARACT EXTRACTION, BILATERAL     2016-2017  . COLONOSCOPY    . CORONARY ARTERY BYPASS GRAFT N/A 06/27/2018   Procedure: CORONARY ARTERY BYPASS GRAFTING (CABG), ON PUMP, TIMES FOUR, USING LEFT INTERNAL MAMMARY ARTERY AND ENDOSCOPICALLY HARVESTED LEFT GREATER SAPHENOUS VEIN;  Surgeon: Melrose Nakayama, MD;  Location: [facility];  Service: Open Heart Surgery;  Laterality: N/A;  . ENDARTERECTOMY Right 10/12/2015   Procedure: RIGHT CAROTID ENDARTERECTOMY WITH PATCH ANGIOPLASTY;  Surgeon: Elam Dutch, MD;  Location: [facility];  Service: Vascular;  Laterality: Right;  . INGUINAL HERNIA REPAIR Left 02/05/2013   Procedure: HERNIA REPAIR INGUINAL ADULT;  Surgeon: Joyice Faster. Cornett, MD;  Location: [facility];  Service: General;  Laterality: Left;  . INGUINAL HERNIA REPAIR Left   . INSERTION OF MESH Left 02/05/2013   Procedure: INSERTION OF MESH;  Surgeon: Joyice Faster. Cornett, MD;  Location: [facility];  Service: General;  Laterality: Left;  . LEFT HEART CATH AND CORONARY ANGIOGRAPHY N/A 06/21/2018   Procedure: LEFT HEART CATH AND CORONARY ANGIOGRAPHY;  Surgeon: Jettie Booze, MD;  Location: [facility];  Service: Cardiovascular;  Laterality: N/A;  . SHOULDER ARTHROSCOPY WITH ROTATOR CUFF REPAIR AND SUBACROMIAL DECOMPRESSION Left 05/01/2014   Procedure: LEFT ARTHROSCOPY SHOULDER SUBACROMIAL DECOMPRESSION,DISTAL CLAVICAL RESECTION AND ROTATOR CUFF REPAIR;  Surgeon: Marin Shutter, MD;  Location: [facility];  Service: Orthopedics;  Laterality: Left;  . TEE WITHOUT CARDIOVERSION N/A 06/27/2018   Procedure: TRANSESOPHAGEAL ECHOCARDIOGRAM (TEE);  Surgeon: Melrose Nakayama, MD;  Location: [facility];  Service: Open Heart Surgery;  Laterality: N/A;  . TESTICLAR CYST EXCISION Left   . TONSILLECTOMY    . VASECTOMY     Social History    Socioeconomic History  . Marital status: Married    Spouse name: Not on file  . Number of children: Not on file  . Years of education: Not on file  . Highest education level: Not on file  Occupational History  . Occupation: retired    Comment: maintenance; electrician  Social Needs  . Financial resource strain: Not on file  . Food insecurity    Worry: Not on file    Inability: Not on file  . Transportation needs    Medical: Not on file    Non-medical: Not on file  Tobacco Use  . Smoking status: Former Smoker    Packs/day: 2.00    Years: 28.00    Pack years: 56.00    Types: Cigarettes    Quit date: 05/31/1987    Years since quitting: 31.6  . Smokeless tobacco: Never Used  . Tobacco comment: quit smoking "in my 42's"  Substance and Sexual Activity  . Alcohol use: Yes    Alcohol/week: 0.0 standard drinks    Comment: rare, mikes hard lemonade  . Drug use: No    Types: Marijuana    Comment: hx of-4-5 yrs ago  . Sexual activity: Not Currently  Lifestyle  . Physical activity    Days per week: Not on file    Minutes per session: Not on file  . Stress: Not on file  Relationships  . Social Herbalist on phone: Not on file    Gets together: Not on file    Attends religious service: Not on file    Active member of club or organization: Not on file    Attends meetings of clubs or organizations: Not on file    Relationship status: Not on file  Other Topics Concern  . Not on file  Social History Narrative   Married 33 years in 2015. Lived together for 12 years. 2 boys from first wife. 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       Hobbies: yardwork, [place] barn, woodwork, collect knive   No Known Allergies Family History  Problem Relation Age of Onset  . Parkinsonism Father   . Dementia Father   . Cancer Father        Throat  . Colon cancer  Mother        died in 61s  . Cancer Mother        Colon     Current Outpatient Medications (Cardiovascular):  .  atorvastatin (LIPITOR) 20 MG tablet, TAKE 1 TABLET EVERY DAY .  fenofibrate 160 MG tablet, TAKE 1 TABLET EVERY DAY (Patient taking differently: Take 160 mg by mouth daily. ) .  furosemide (LASIX)  20 MG tablet, Take 1 tablet (20 mg total) by mouth daily. .  metoprolol tartrate (LOPRESSOR) 25 MG tablet, Take 25 mg by mouth 2 (two) times daily.   Current Outpatient Medications (Respiratory):  .  albuterol (VENTOLIN HFA) 108 (90 Base) MCG/ACT inhaler, Inhale 2 puffs into the lungs every 12 (twelve) hours as needed for wheezing or shortness of breath.  Current Outpatient Medications (Analgesics):  .  aspirin EC 81 MG EC tablet, Take 1 tablet (81 mg total) by mouth daily. Marland Kitchen  oxyCODONE-acetaminophen (PERCOCET) 7.5-325 MG tablet, Take 1 tablet by mouth every 8 (eight) hours as needed for severe pain.  Current Outpatient Medications (Hematological):  .  vitamin B-12 (CYANOCOBALAMIN) 1000 MCG tablet, Take 1,000 mcg by mouth daily.  Current Outpatient Medications (Other):  .  amoxicillin-clavulanate (AUGMENTIN) 875-125 MG tablet, Take 1 tablet by mouth 2 (two) times daily for 7 days. Marland Kitchen  BIOTIN PO, Take 1 tablet by mouth daily. .  Cholecalciferol (VITAMIN D-3) 1000 UNITS CAPS, Take 1,000 Units by mouth daily.  .  diazepam (VALIUM) 5 MG tablet, TAKE 1/2 TO 1 TABLET q8 hours PRN pain sleep/muscle spasm-only if wife is around+can watch you closely when taking due to sedation risk (Patient taking differently: Take 2.5-5 mg by mouth See admin instructions. Take 2.5-5mg  every 8 hours as needed for pain sleep/muscle spasm-only if wife is around+can watch pt closely when taking due to sedation risk) .  gabapentin (NEURONTIN) 300 MG capsule, Take 1 capsule (300 mg total) by mouth 2 (two) times daily. Marland Kitchen  linaclotide (LINZESS) 145 MCG CAPS capsule, Take 1 capsule (145 mcg total) by mouth daily  before breakfast. .  Melatonin 10 MG TABS, Take 10 mg by mouth at bedtime.  .  tamsulosin (FLOMAX) 0.4 MG CAPS capsule, TAKE 1 CAPSULE EVERY DAY (Patient taking differently: Take 0.4 mg by mouth daily. ) .  tiZANidine (ZANAFLEX) 4 MG tablet, TAKE 1 TABLET EVERY 6 HOURS AS NEEDED FOR MUSCLE SPASM(S) (Patient taking differently: Take 4 mg by mouth at bedtime. )    Past medical history, social, surgical and family history all reviewed in electronic medical record.  No pertanent information unless stated regarding to the chief complaint.   Review of Systems:  No headache, visual changes, nausea, vomiting, diarrhea, constipation, dizziness, abdominal pain, skin rash, fevers, chills, night sweats, weight loss, swollen lymph nodes, body aches, joint swelling,  chest pain, shortness of breath, mood changes.  Positive muscle pain   Objective  Blood pressure 120/80, pulse 72, height 5\' 7"  (1.702 m), weight 175 lb (79.4 kg), SpO2 91 %.    General: No apparent distress alert and oriented x3 mood and affect normal, dressed appropriately.  HEENT: Pupils equal, extraocular movements intact  Respiratory: Patient's speak in full sentences and does not appear short of breath  Cardiovascular: No lower extremity edema, non tender, no erythema  Skin: Warm dry intact with no signs of infection or rash on extremities or on axial skeleton.  Abdomen: Soft nontender  Neuro: Cranial nerves II through XII are intact, neurovascularly intact in all extremities with 2+ DTRs and 2+ pulses.  Lymph: No lymphadenopathy of posterior or anterior cervical chain or axillae bilaterally.  Gait antalgic  MSK:  tender with limited range of motion and good stability and symmetric strength and tone of shoulders, elbows, wrist, hip and ankles bilaterally.  Arthritic changes of multiple joints Knee: Bilateral valgus deformity noted.  Abnormal thigh to calf ratio.  Tender to palpation over medial and PF joint  line.  ROM full in  flexion and extension and lower leg rotation. instability with valgus force.  painful patellar compression. Patellar glide with moderate crepitus. Patellar and quadriceps tendons unremarkable.  After informed written and verbal consent, patient was seated on exam table. Right knee was prepped with alcohol swab and utilizing anterolateral approach, patient's right knee space was injected with 4:1  marcaine 0.5%: Kenalog 40mg /dL. Patient tolerated the procedure well without immediate complications.  After informed written and verbal consent, patient was seated on exam table. Left knee was prepped with alcohol swab and utilizing anterolateral approach, patient's left knee space was injected with 4:1  marcaine 0.5%: Kenalog 40mg /dL. Patient tolerated the procedure well without immediate complications.   Impression and Recommendations:     This case required medical decision making of moderate complexity. The above documentation has been reviewed and is accurate and complete Lyndal Pulley, DO       Note: This dictation was prepared with Dragon dictation along with smaller phrase technology. Any transcriptional errors that result from this process are unintentional.

## 2019-01-14 ENCOUNTER — Encounter: Payer: Self-pay | Admitting: Family Medicine

## 2019-01-14 ENCOUNTER — Ambulatory Visit (INDEPENDENT_AMBULATORY_CARE_PROVIDER_SITE_OTHER): Payer: Medicare HMO | Admitting: Family Medicine

## 2019-01-14 ENCOUNTER — Other Ambulatory Visit: Payer: Self-pay

## 2019-01-14 DIAGNOSIS — M170 Bilateral primary osteoarthritis of knee: Secondary | ICD-10-CM | POA: Diagnosis not present

## 2019-01-14 NOTE — Assessment & Plan Note (Signed)
Bilateral injections given today.  Tolerated the procedure well.  Discussed icing regimen and home exercises, patient could be a candidate for Visco supplementation.  Discussed continuing the crutch for support.

## 2019-01-14 NOTE — Patient Instructions (Signed)
Good to see you  You are doing great  Injected both knees.  Stay active See me agai nin 6 weeks and will do gel injections if needed

## 2019-01-21 ENCOUNTER — Encounter: Payer: Self-pay | Admitting: Registered Nurse

## 2019-01-21 ENCOUNTER — Encounter: Payer: Medicare HMO | Attending: Physical Medicine & Rehabilitation | Admitting: Registered Nurse

## 2019-01-21 ENCOUNTER — Other Ambulatory Visit: Payer: Self-pay

## 2019-01-21 VITALS — BP 168/80 | HR 60 | Temp 97.9°F | Ht 67.0 in | Wt 182.2 lb

## 2019-01-21 DIAGNOSIS — Z5181 Encounter for therapeutic drug level monitoring: Secondary | ICD-10-CM

## 2019-01-21 DIAGNOSIS — G894 Chronic pain syndrome: Secondary | ICD-10-CM | POA: Insufficient documentation

## 2019-01-21 DIAGNOSIS — M25551 Pain in right hip: Secondary | ICD-10-CM | POA: Insufficient documentation

## 2019-01-21 DIAGNOSIS — M47816 Spondylosis without myelopathy or radiculopathy, lumbar region: Secondary | ICD-10-CM | POA: Diagnosis not present

## 2019-01-21 DIAGNOSIS — M1712 Unilateral primary osteoarthritis, left knee: Secondary | ICD-10-CM | POA: Diagnosis not present

## 2019-01-21 DIAGNOSIS — Z79899 Other long term (current) drug therapy: Secondary | ICD-10-CM | POA: Diagnosis not present

## 2019-01-21 DIAGNOSIS — I739 Peripheral vascular disease, unspecified: Secondary | ICD-10-CM | POA: Insufficient documentation

## 2019-01-21 MED ORDER — OXYCODONE-ACETAMINOPHEN 7.5-325 MG PO TABS: 1.0000 | ORAL_TABLET | Freq: Three times a day (TID) | ORAL | 0 refills | Status: DC | PRN

## 2019-01-21 NOTE — Progress Notes (Signed)
Subjective:    Patient ID: Kevin [name], male    DOB: 08/03/1940, 78 y.o.   MRN: [id number]  HPI: Kevin [name] is a 78 y.o. male who returns for follow up appointment for chronic pain and medication refill. He states his pain is located in his lower back and left knee. He rates his pain 2. His current exercise regime is walking.  Mr. Grigg was admitted to [facility] on 01/07/2019 and discharged on 01/09/2019 for CAP, note reviewed.   Mr. Gurry forgot his medication, narcotic contract was reviewed, he verbalizes understanding.   Mr. Corp Morphine equivalent is 32.81 MME.  He is also prescribed Diazepam  by [facility]ician. We have discussed the black box warning of using opioids and benzodiazepines. I highlighted the dangers of using these drugs together and discussed the adverse events including respiratory suppression, overdose, cognitive impairment and importance of compliance with current regimen. We will continue to monitor and adjust as indicated.   Last Oral Swab was Performed on 11/12/2018, it was consistent.   Pain Inventory Average Pain 4 Pain Right Now 2 My pain is aching  In the last 24 hours, has pain interfered with the following? General activity 6 Relation with others 3 Enjoyment of life 3 What TIME of day is your pain at its worst? daytime Sleep (in general) Fair  Pain is worse with: walking, bending and standing Pain improves with: rest and medication Relief from Meds: 5  Mobility use a cane how many minutes can you walk? 10 ability to climb steps?  yes do you drive?  yes  Function retired  Neuro/Psych No problems in this area  Prior Studies Any changes since last visit?  yes  Physicians involved in your care Any changes since last visit?  no   Family History  Problem Relation Age of Onset   Parkinsonism Father    Dementia Father    Cancer Father        Throat   Colon cancer Mother        died in 71s   Cancer Mother    Colon   Social History   Socioeconomic History   Marital status: Married    Spouse name: Not on file   Number of children: Not on file   Years of education: Not on file   Highest education level: Not on file  Occupational History   Occupation: retired    Comment: maintenance; Psychologist, sport and exercise strain: Not on file   Food insecurity    Worry: Not on file    Inability: Not on file   Transportation needs    Medical: Not on file    Non-medical: Not on file  Tobacco Use   Smoking status: Former Smoker    Packs/day: 2.00    Years: 28.00    Pack years: 56.00    Types: Cigarettes    Quit date: 05/31/1987    Years since quitting: 31.6   Smokeless tobacco: Never Used   Tobacco comment: quit smoking "in my 54's"  Substance and Sexual Activity   Alcohol use: Yes    Alcohol/week: 0.0 standard drinks    Comment: rare, mikes hard lemonade   Drug use: No    Types: Marijuana    Comment: hx of-4-5 yrs ago   Sexual activity: Not Currently  Lifestyle   Physical activity    Days per week: Not on file    Minutes per session: Not on file  Stress: Not on file  Relationships   Social connections    Talks on phone: Not on file    Gets together: Not on file    Attends religious service: Not on file    Active member of club or organization: Not on file    Attends meetings of clubs or organizations: Not on file    Relationship status: Not on file  Other Topics Concern   Not on file  Social History Narrative   Married 33 years in 2015. Lived together for 12 years. 2 boys from first wife. 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 Cornett, MD;  Location: [facility];  Service: General;  Laterality: Left;   INGUINAL HERNIA REPAIR Left    INSERTION OF MESH Left 02/05/2013   Procedure: INSERTION OF MESH;  Surgeon: Joyice Faster. Cornett, MD;  Location: [facility];  Service: General;  Laterality: Left;   LEFT HEART CATH AND CORONARY ANGIOGRAPHY N/A 06/21/2018   Procedure: LEFT HEART CATH AND CORONARY ANGIOGRAPHY;  Surgeon: Jettie Booze, MD;  Location: [facility];  Service: Cardiovascular;  Laterality: N/A;   SHOULDER ARTHROSCOPY WITH ROTATOR CUFF REPAIR AND SUBACROMIAL DECOMPRESSION Left 05/01/2014   Procedure: LEFT ARTHROSCOPY SHOULDER SUBACROMIAL DECOMPRESSION,DISTAL CLAVICAL RESECTION AND ROTATOR CUFF REPAIR;  Surgeon: Marin Shutter, MD;  Location: [facility];  Service: Orthopedics;  Laterality: Left;   TEE WITHOUT CARDIOVERSION N/A 06/27/2018   Procedure: TRANSESOPHAGEAL ECHOCARDIOGRAM (TEE);  Surgeon: Melrose Nakayama, MD;  Location: [facility];  Service: Open Heart Surgery;  Laterality: N/A;   TESTICLAR CYST EXCISION Left    TONSILLECTOMY     VASECTOMY     Past Medical History:  Diagnosis Date   Arthritis    DDD- Lower Back   Bradycardia    CAP (community acquired pneumonia) 03/17/2015   "THAT'S WHAT THEY ARE  THINKING; THEY ARE NOT SURE"   Carotid artery occlusion    Chest pain 06/19/2018   Chronic lower back pain    DDD   CKD (chronic kidney disease)    Dr. 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      Muscle spasm    takes Zanaflex daily as needed   Peripheral edema    takes Furosemide daily   Pneumonia    hx of    Rheumatic fever 1946   Rheumatoid arthritis ([place])    Short-term memory loss    minimal   Shortness of breath dyspnea    with exertion but lasts very short period of time   TIA (transient ischemic attack)    x 2;takes Plavix daily   Urinary frequency    takes Flomax daily   Urinary urgency    BP (!) 184/82    Pulse 61    Temp 97.9 F (36.6 C)    Ht 5\' 7"  (1.702 m)    Wt 182 lb 3.2 oz (82.6 kg)    SpO2 95%    BMI 28.54 kg/m   Opioid Risk Score:   Fall Risk Score:  `1  Depression screen PHQ 2/9  Depression screen [facility] 2/9 08/09/2018 04/16/2018 01/22/2018 10/19/2017 10/05/2017 09/25/2017 07/03/2017  Decreased Interest 0 0 0 0 0 0 0  Down, Depressed, Hopeless 0 0 0 0 0 0 0  PHQ - 2 Score 0 0 0 0 0 0 0  Some recent data might be hidden   Review of Systems  Constitutional: Negative.   HENT: Negative.   Eyes: Negative.   Respiratory: Negative.   Cardiovascular: Negative.   Gastrointestinal: Negative.   Endocrine: Negative.   Genitourinary: Negative.   Musculoskeletal: Negative.   Skin: Negative.     Allergic/Immunologic: Negative.   Neurological: Negative.   Hematological: Negative.   Psychiatric/Behavioral: Negative.   All other systems reviewed and are negative.      Objective:   Physical Exam Vitals signs and nursing note reviewed.  Constitutional:      Appearance: Normal appearance.  Neck:     Musculoskeletal: Normal range of motion and neck supple.  Cardiovascular:     Rate and Rhythm: Normal rate and regular rhythm.     Pulses: Normal pulses.     Heart sounds: Normal heart sounds.  Pulmonary:     Effort: Pulmonary effort is normal.     Breath sounds: Normal breath sounds.  Musculoskeletal:     Comments: Normal Muscle Bulk and Muscle Testing Reveals:  Upper Extremities: Full ROM and Muscle Strength 5/5 , Lumbar Paraspinal Tenderness: L-4-L-5   Lower Extremities: Full ROM and Muscle Strength 5/5 Arises from chair with ease using cane for support Narrow Based  Gait   Skin:    General: Skin is warm and dry.  Neurological:     Mental Status: He is alert and oriented to person, place, and time.  Psychiatric:        Mood and Affect: Mood normal.        Behavior: Behavior normal.           Assessment & Plan:  1. Lumbar Spondylosis/ Lumbar Stenosis:12/12/2018 Continue current medication regime. Refilled:Oxycodone 7.5/325 mg one tabletevery 8 hoursas needed #70.12/12/2018 We will continue the opioid monitoring program, this consists of regular clinic visits, examinations, urine drug screen, pill counts as well as use of [place] Controlled Substance Reporting System. 2. ChronicBilateralKnee Pain:S/P Bilateral  Knee  Injection by Dr Paulla Fore on 01/14/2019, note was reviewed.Orthopedist Following Dr. Paulla Fore.01/21/2019 3. Right Hip Pain: Orthopedist Following.No complaints today.01/21/2019  81minutes of face to face patient care time was spent during this visit. All questions were encouraged and answered.  F/U in 1 month

## 2019-01-25 ENCOUNTER — Telehealth: Payer: Self-pay | Admitting: Physical Therapy

## 2019-01-25 MED ORDER — BENZONATATE 100 MG PO CAPS: 100.0000 mg | ORAL_CAPSULE | Freq: Two times a day (BID) | ORAL | 0 refills | Status: DC | PRN

## 2019-01-25 NOTE — Addendum Note (Signed)
Addended by: Marin Olp on: 01/25/2019 08:14 PM   Modules accepted: Orders

## 2019-01-25 NOTE — Telephone Encounter (Signed)
Copied from [place] [id number]. Topic: General - Other >> Jan 25, 2019  2:36 PM Antonieta Iba C wrote: Reason for CRM: pt says that he has been feeling very well since getting discharged. Pt says that he has noticed that he is getting a cough /rattle in his chest. Pt would like to know if provider would send something in to the pharmacy?   Pharmacy: [facility] [address], [place] - [place] [address] [phone] (Phone) [phone] (Fax)   CB:[phone]

## 2019-01-25 NOTE — Telephone Encounter (Signed)
Pt in the hospital 01/07/19 for pneumonia. Looks like they were unable to reach him to do TCM call. Do you want to try to work him in sometime next week or OK to send rx for cough?

## 2019-01-28 NOTE — Telephone Encounter (Signed)
Called and spoke with patient. He picked up rx and is feeling a lot better. Declines to schedule visit at this time but will call to schedule if sx flare up again.

## 2019-02-05 NOTE — Telephone Encounter (Signed)
`  VIDEO- Use phone number:  [phone]  Virtual Visit Pre-Appointment Phone Call   1. Confirm consent - "In the setting of the current Covid19 crisis, you are scheduled for a VIDEO visit with your provider on  at (time).  Just as we do with many in-office visits, in order for you to participate in this visit, we must obtain consent.  If you'd like, I can send this to your mychart (if signed up) or email for you to review.  Otherwise, I can obtain your verbal consent now.  All virtual visits are billed to your insurance company just like a normal visit would be.  By agreeing to a virtual visit, we'd like you to understand that the technology does not allow for your provider to perform an examination, and thus may limit your provider's ability to fully assess your condition. If your provider identifies any concerns that need to be evaluated in person, we will make arrangements to do so.  Finally, though the technology is pretty good, we cannot assure that it will always work on either your or our end, and in the setting of a video visit, we may have to convert it to a phone-only visit.  In either situation, we cannot ensure that we have a secure connection.  Are you willing to proceed?" STAFF: Did the patient verbally acknowledge consent to telehealth visit? Document YES/NO here: YES  2. Advise patient to be prepared - "Two hours prior to your appointment, go ahead and check your blood pressure, pulse, oxygen saturation, and your weight (if you have the equipment to check those) and write them all down. When your visit starts, your provider will ask you for this information. If you have an Apple Watch or Kardia device, please plan to have heart rate information ready on the day of your appointment. Please have a pen and paper handy nearby the day of the visit as well."  3. Give patient instructions for MyChart download to smartphone OR [email or web address] as below if video visit (depending on what platform  provider is using)  4. Inform patient they will receive a phone call 15 minutes prior to their appointment time (may be from unknown caller ID) so they should be prepared to answer    TELEPHONE CALL NOTE  Kevin [name] has been deemed a candidate for a follow-up tele-health visit to limit community exposure during the Covid-19 pandemic. I spoke with the patient via phone to ensure availability of phone/video source, confirm preferred email & phone number, and discuss instructions and expectations.  I reminded Kevin [name] to be prepared with any vital sign and/or heart rhythm information that could potentially be obtained via home monitoring, at the time of his visit. I reminded Kevin [name] to expect a phone call prior to his visit.  Wilma Flavin, RN 02/05/2019 11:59 AM    FULL LENGTH CONSENT FOR TELE-HEALTH VISIT   I hereby voluntarily request, consent and authorize [facility] and its employed or contracted physicians, physician assistants, nurse practitioners or other licensed health care professionals (the Practitioner), to provide me with telemedicine health care services (the "Services") as deemed necessary by the treating Practitioner. I acknowledge and consent to receive the Services by the Practitioner via telemedicine. I understand that the telemedicine visit will involve communicating with the Practitioner through live audiovisual communication technology and the disclosure of certain medical information by electronic transmission. I acknowledge that I have been given the opportunity to request an in-person assessment or  other available alternative prior to the telemedicine visit and am voluntarily participating in the telemedicine visit.  I understand that I have the right to withhold or withdraw my consent to the use of telemedicine in the course of my care at any time, without affecting my right to future care or treatment, and that the Practitioner or I may terminate the  telemedicine visit at any time. I understand that I have the right to inspect all information obtained and/or recorded in the course of the telemedicine visit and may receive copies of available information for a reasonable fee.  I understand that some of the potential risks of receiving the Services via telemedicine include:  Marland Kitchen Delay or interruption in medical evaluation due to technological equipment failure or disruption; . Information transmitted may not be sufficient (e.g. poor resolution of images) to allow for appropriate medical decision making by the Practitioner; and/or  . In rare instances, security protocols could fail, causing a breach of personal health information.  Furthermore, I acknowledge that it is my responsibility to provide information about my medical history, conditions and care that is complete and accurate to the best of my ability. I acknowledge that Practitioner's advice, recommendations, and/or decision may be based on factors not within their control, such as incomplete or inaccurate data provided by me or distortions of diagnostic images or specimens that may result from electronic transmissions. I understand that the practice of medicine is not an exact science and that Practitioner makes no warranties or guarantees regarding treatment outcomes. I acknowledge that I will receive a copy of this consent concurrently upon execution via email to the email address I last provided but may also request a printed copy by calling the office of [facility].    I understand that my insurance will be billed for this visit.   I have read or had this consent read to me. . I understand the contents of this consent, which adequately explains the benefits and risks of the Services being provided via telemedicine.  . I have been provided ample opportunity to ask questions regarding this consent and the Services and have had my questions answered to my satisfaction. . I give my informed  consent for the services to be provided through the use of telemedicine in my medical care  By participating in this telemedicine visit I agree to the above.

## 2019-02-07 ENCOUNTER — Telehealth (INDEPENDENT_AMBULATORY_CARE_PROVIDER_SITE_OTHER): Payer: Medicare HMO | Admitting: Internal Medicine

## 2019-02-07 ENCOUNTER — Other Ambulatory Visit: Payer: Self-pay

## 2019-02-07 VITALS — BP 139/86 | HR 68 | Ht 67.0 in | Wt 176.0 lb

## 2019-02-07 DIAGNOSIS — N184 Chronic kidney disease, stage 4 (severe): Secondary | ICD-10-CM

## 2019-02-07 DIAGNOSIS — I2510 Atherosclerotic heart disease of native coronary artery without angina pectoris: Secondary | ICD-10-CM | POA: Diagnosis not present

## 2019-02-07 DIAGNOSIS — I4891 Unspecified atrial fibrillation: Secondary | ICD-10-CM

## 2019-02-07 DIAGNOSIS — E782 Mixed hyperlipidemia: Secondary | ICD-10-CM

## 2019-02-07 NOTE — Progress Notes (Signed)
Virtual Visit via Telephone Note   This visit type was conducted due to national recommendations for restrictions regarding the COVID-19 Pandemic (e.g. social distancing) in an effort to limit this patient's exposure and mitigate transmission in our community.  Due to his co-morbid illnesses, this patient is at least at moderate risk for complications without adequate follow up.  This format is felt to be most appropriate for this patient at this time.  The patient did not have access to video technology/had technical difficulties with video requiring transitioning to audio format only (telephone).  All issues noted in this document were discussed and addressed.  No physical exam could be performed with this format.  Please refer to the patient's chart for his  consent to telehealth for [facility].   Date:  02/07/2019   ID:  Kevin [name], DOB February 15, 1941, MRN [id number]  Patient Location: Home Provider Location: Office  PCP:  Marin Olp, MD  Cardiologist:  Dorris Carnes, MD  Electrophysiologist:  None   Evaluation Performed:  Follow-Up Visit  Chief Complaint:  F/U of CAD   History of Present Illness:    Kevin [name] is a 78 y.o. male with  a history of CAD( s/p CABG in 2020) HTN, HL, bradycardial, PVOD (s/p AAA repair; s/p bilateral CEA), TIA x 2 on plavix, RA, DCK   The pt was admitted in Jan 2020 with CP Cath showed: 80% pox LAD; 90% D1; 80% OM2; 100% RCA with L to r collaterals The pt underwent CABG x 4 (LIMA to LAD; SVG to D1, SVG to OM2; SVG to PDA) Post op had PAF. Placed on amiodarone He is now off of this as well as off of anticoagulation  I last had a televisit with the pt in July 2020   SInce then he was admitted in Aug with a community acquired pneumonia  Treated with ABX     The pt says since he left hosp he feels a little off balance   Usually early in AM    NOt dizzy later    Energy is down some days    Most of timeThe patient is fatigued   Denies CP   Breathing is OK    Does not sleep well at night due to pain in jts, back     Pt {does not  have symptoms concerning for COVID-19 infection (fever, chills, cough, or new shortness of breath).    Past Medical History:  Diagnosis Date  . Arthritis    DDD- Lower Back  . Bradycardia   . CAP (community acquired pneumonia) 03/17/2015   "THAT'S WHAT THEY ARE THINKING; THEY ARE NOT SURE"  . Carotid artery occlusion   . Chest pain 06/19/2018  . Chronic lower back pain    DDD  . CKD (chronic kidney disease)    Dr. Corliss Parish  . CKD (chronic kidney disease), stage III ([place]) 01/27/2014   GFR just above 30--> just below 30 11/10/14 refer to nephrology Creatinine 2 baseline but went up to 2.5 peak Likely RAS- lisinopril not great choice   . Diverticulosis   . Enlarged prostate    takes Flomax daily  . History of blood transfusion    "probably when they did aortic aneurysm"  . History of colon polyps    benign  . Hyperlipidemia   . Hypertension    takes Amlodipine and Zebeta daily  . Insomnia    takes Melatonin nightly  . Joint pain   . 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     . Muscle spasm    takes Zanaflex daily as needed  . Peripheral edema    takes Furosemide daily  . Pneumonia    hx of   . Rheumatic fever 1946  . Rheumatoid arthritis ([place])   . Short-term memory loss    minimal  . Shortness of breath dyspnea    with exertion but lasts very short period of time  . TIA (transient ischemic attack)    x 2;takes Plavix daily  . Urinary frequency    takes Flomax daily  . Urinary urgency    Past Surgical History:  Procedure Laterality Date  . ABDOMINAL AORTIC ANEURYSM REPAIR  2010  . CAROTID ENDARTERECTOMY Left 09/20/2004  . CATARACT EXTRACTION, BILATERAL     2016-2017  . COLONOSCOPY    . CORONARY ARTERY BYPASS GRAFT N/A 06/27/2018   Procedure: CORONARY ARTERY BYPASS GRAFTING (CABG), ON PUMP, TIMES FOUR, USING LEFT INTERNAL MAMMARY  ARTERY AND ENDOSCOPICALLY HARVESTED LEFT GREATER SAPHENOUS VEIN;  Surgeon: Melrose Nakayama, MD;  Location: [place];  Service: Open Heart Surgery;  Laterality: N/A;  . ENDARTERECTOMY Right 10/12/2015   Procedure: RIGHT CAROTID ENDARTERECTOMY WITH PATCH ANGIOPLASTY;  Surgeon: Elam Dutch, MD;  Location: [place];  Service: Vascular;  Laterality: Right;  . INGUINAL HERNIA REPAIR Left 02/05/2013   Procedure: HERNIA REPAIR INGUINAL ADULT;  Surgeon: Joyice Faster. Cornett, MD;  Location: [place];  Service: General;  Laterality: Left;  . INGUINAL HERNIA REPAIR Left   . INSERTION OF MESH Left 02/05/2013   Procedure: INSERTION OF MESH;  Surgeon: Joyice Faster. Cornett, MD;  Location: [place];  Service: General;  Laterality: Left;  . LEFT HEART CATH AND CORONARY ANGIOGRAPHY N/A 06/21/2018   Procedure: LEFT HEART CATH AND CORONARY ANGIOGRAPHY;  Surgeon: Jettie Booze, MD;  Location: [place] CV [facility];  Service: Cardiovascular;  Laterality: N/A;  . SHOULDER ARTHROSCOPY WITH ROTATOR CUFF REPAIR AND SUBACROMIAL DECOMPRESSION Left 05/01/2014   Procedure: LEFT ARTHROSCOPY SHOULDER SUBACROMIAL DECOMPRESSION,DISTAL CLAVICAL RESECTION AND ROTATOR CUFF REPAIR;  Surgeon: Marin Shutter, MD;  Location: [place];  Service: Orthopedics;  Laterality: Left;  . TEE WITHOUT CARDIOVERSION N/A 06/27/2018   Procedure: TRANSESOPHAGEAL ECHOCARDIOGRAM (TEE);  Surgeon: Melrose Nakayama, MD;  Location: [place];  Service: Open Heart Surgery;  Laterality: N/A;  . TESTICLAR CYST EXCISION Left   . TONSILLECTOMY    . VASECTOMY       No outpatient medications have been marked as taking for the 02/07/19 encounter (Telemedicine) with Fay Records, MD.     Allergies:   Patient has no known allergies.   Social History   Tobacco Use  . Smoking status: Former Smoker    Packs/day: 2.00    Years: 28.00    Pack years: 56.00    Types: Cigarettes    Quit date: 05/31/1987    Years since quitting: 31.7  .  Smokeless tobacco: Never Used  . Tobacco comment: quit smoking "in my 50's"  Substance Use Topics  . Alcohol use: Yes    Alcohol/week: 0.0 standard drinks    Comment: rare, mikes hard lemonade  . Drug use: No    Types: Marijuana    Comment: hx of-4-5 yrs ago     Family Hx: The patient's family history includes Cancer in his father and mother; Colon cancer in his mother; Dementia in his father; Parkinsonism in his father.  ROS:   Please  see the history of present illness.    All other systems reviewed and are negative.     Labs/Other Tests and Data Reviewed:    EKG:  No ECG reviewed.  Recent Labs: 07/02/2018: Magnesium 2.1 10/26/2018: ALT 16; TSH 0.794 01/09/2019: BUN 26; Creatinine, Ser 2.01; Hemoglobin 10.6; Platelets 264; Potassium 3.9; Sodium 136   Recent Lipid Panel Lab Results  Component Value Date/Time   CHOL 113 06/20/2018 05:19 AM   TRIG 177 (H) 06/20/2018 05:19 AM   HDL 23 (L) 06/20/2018 05:19 AM   CHOLHDL 4.9 06/20/2018 05:19 AM   LDLCALC 55 06/20/2018 05:19 AM   LDLDIRECT 134.9 12/26/2012 12:01 PM    Wt Readings from Last 3 Encounters:  02/07/19 176 lb (79.8 kg)  01/21/19 182 lb 3.2 oz (82.6 kg)  01/14/19 175 lb (79.4 kg)     Objective:    Vital Signs:  BP 139/86   Pulse 68   Ht 5\' 7"  (1.702 m)   Wt 176 lb (79.8 kg)   BMI 27.57 kg/m    VITAL SIGNS:  reviewed  ASSESSMENT & PLAN:    1. CAD   Pt is s/p CABG as noted above earlier this year   No symptoms to sugg angina   I am not convinced fatigue is cardiac in origin   May be from poor qual sleep due to DJD/pain  2  HL   Will set up for repeat lipids    3  HTN  BP is fair   DO not want to push lower with unsteadingess 4  CKD  REpeat BMET  5  ANemia   WIll repeat  CBC and fe, ferr, TIBC  F?U at the end of January in person.    COVID-19 Education: The signs and symptoms of COVID-19 were discussed with the patient and how to seek care for testing (follow up with PCP or arrange E-visit).  The  importance of social distancing was discussed today.  Time:   Today, I have spent 15 minutes with the patient with telehealth technology discussing the above problems.     Medication Adjustments/Labs and Tests Ordered: Current medicines are reviewed at length with the patient today.  Concerns regarding medicines are outlined above.   Tests Ordered: No orders of the defined types were placed in this encounter.   Medication Changes: No orders of the defined types were placed in this encounter.   Follow Up:  In Person end of Jan 2021  Signed, Dorris Carnes, MD  02/07/2019 9:53 AM    [place]

## 2019-02-07 NOTE — Patient Instructions (Signed)
Medication Instructions:  No changes If you need a refill on your cardiac medications before your next appointment, please call your pharmacy.   Lab work: Lipids, bmet, cbc, iron ferritin , TIBC If you have labs (blood work) drawn today and your tests are completely normal, you will receive your results only by: Marland Kitchen MyChart Message (if you have MyChart) OR . A paper copy in the mail If you have any lab test that is abnormal or we need to change your treatment, we will call you to review the results.  Testing/Procedures: none  Follow-Up: At [facility], you and your health needs are our priority.  As part of our continuing mission to provide you with exceptional heart care, we have created designated Provider Care Teams.  These Care Teams include your primary Cardiologist (physician) and Advanced Practice Providers (APPs -  Physician Assistants and Nurse Practitioners) who all work together to provide you with the care you need, when you need it. You will need a follow up appointment in:  4-5 months.  Please call our office 2 months in advance to schedule this appointment.  You may see Dorris Carnes, MD or one of the following Advanced Practice Providers on your designated Care Team: Richardson Dopp, PA-C [place], [place] . Daune Perch, NP  Any Other Special Instructions Will Be Listed Below (If Applicable).

## 2019-02-13 ENCOUNTER — Other Ambulatory Visit: Payer: Self-pay | Admitting: Family Medicine

## 2019-02-14 ENCOUNTER — Encounter: Payer: Medicare HMO | Attending: Physical Medicine & Rehabilitation | Admitting: Registered Nurse

## 2019-02-14 ENCOUNTER — Encounter: Payer: Medicare HMO | Admitting: Registered Nurse

## 2019-02-14 ENCOUNTER — Other Ambulatory Visit: Payer: Self-pay

## 2019-02-14 ENCOUNTER — Encounter: Payer: Self-pay | Admitting: Registered Nurse

## 2019-02-14 VITALS — BP 172/79 | HR 60 | Temp 98.3°F | Ht 67.0 in | Wt 182.0 lb

## 2019-02-14 DIAGNOSIS — M25561 Pain in right knee: Secondary | ICD-10-CM

## 2019-02-14 DIAGNOSIS — I10 Essential (primary) hypertension: Secondary | ICD-10-CM | POA: Diagnosis not present

## 2019-02-14 DIAGNOSIS — M25551 Pain in right hip: Secondary | ICD-10-CM | POA: Insufficient documentation

## 2019-02-14 DIAGNOSIS — Z79899 Other long term (current) drug therapy: Secondary | ICD-10-CM | POA: Diagnosis not present

## 2019-02-14 DIAGNOSIS — G8929 Other chronic pain: Secondary | ICD-10-CM

## 2019-02-14 DIAGNOSIS — G894 Chronic pain syndrome: Secondary | ICD-10-CM

## 2019-02-14 DIAGNOSIS — I739 Peripheral vascular disease, unspecified: Secondary | ICD-10-CM | POA: Insufficient documentation

## 2019-02-14 DIAGNOSIS — Z5181 Encounter for therapeutic drug level monitoring: Secondary | ICD-10-CM | POA: Diagnosis not present

## 2019-02-14 DIAGNOSIS — M7918 Myalgia, other site: Secondary | ICD-10-CM | POA: Diagnosis not present

## 2019-02-14 DIAGNOSIS — M47816 Spondylosis without myelopathy or radiculopathy, lumbar region: Secondary | ICD-10-CM

## 2019-02-14 DIAGNOSIS — M1712 Unilateral primary osteoarthritis, left knee: Secondary | ICD-10-CM

## 2019-02-14 MED ORDER — OXYCODONE-ACETAMINOPHEN 7.5-325 MG PO TABS: 1.0000 | ORAL_TABLET | Freq: Three times a day (TID) | ORAL | 0 refills | Status: DC | PRN

## 2019-02-14 NOTE — Progress Notes (Signed)
Subjective:    Patient ID: Kevin [name], male    DOB: [birth year], 78 y.o.   MRN: [id number]  HPI: Kevin [name] is a 78 y.o. male who returns for follow up appointment for chronic pain and medication refill. He states his pain is located in his lower back, left buttock pain and bilateral knee pain.Marland Kitchen He rates his pain 8. His current exercise regime is walking and performing stretching exercises.  Mr. Cupps reports two days ago he was getting ready to sit in his chair and began leaning forward to play with his cat, when he lost his balanced and hit his back against the chair and landed on his buttock. His wife helped him up, he didn't seek medical attention. Lower back with resolving ecchymosis noted. Educated on falls prevention he verbalizes understanding.   Mr. Westrup Morphine equivalent is 32.81  MME. He  is also prescribed Diazepam by Dr. [name]  .We have discussed the black box warning of using opioids and benzodiazepines. I highlighted the dangers of using these drugs together and discussed the adverse events including respiratory suppression, overdose, cognitive impairment and importance of compliance with current regimen. We will continue to monitor and adjust as indicated.   Last Oral Swab was Performed on 11/12/2018, it was consistent.    Pain Inventory Average Pain 8 Pain Right Now 8 My pain is aching  In the last 24 hours, has pain interfered with the following? General activity 7 Relation with others 5 Enjoyment of life 8 What TIME of day is your pain at its worst? morning and daytime Sleep (in general) Fair  Pain is worse with: walking and bending Pain improves with: medication Relief from Meds: 5  Mobility use a walker ability to climb steps?  no do you drive?  yes  Function retired  Neuro/Psych weakness tremor trouble walking  Prior Studies Any changes since last visit?  no  Physicians involved in your care Any changes since last visit?  yes Primary care .    Family History  Problem Relation Age of Onset  . Parkinsonism Father   . Dementia Father   . Cancer Father        Throat  . Colon cancer Mother        died in 18s  . Cancer Mother        Colon   Social History   Socioeconomic History  . Marital status: Married    Spouse name: Not on file  . Number of children: Not on file  . Years of education: Not on file  . Highest education level: Not on file  Occupational History  . Occupation: retired    Comment: maintenance; electrician  Social Needs  . Financial resource strain: Not on file  . Food insecurity    Worry: Not on file    Inability: Not on file  . Transportation needs    Medical: Not on file    Non-medical: Not on file  Tobacco Use  . Smoking status: Former Smoker    Packs/day: 2.00    Years: 28.00    Pack years: 56.00    Types: Cigarettes    Quit date: 05/31/1987    Years since quitting: 31.7  . Smokeless tobacco: Never Used  . Tobacco comment: quit smoking "in my 49's"  Substance and Sexual Activity  . Alcohol use: Yes    Alcohol/week: 0.0 standard drinks    Comment: rare, mikes hard lemonade  . Drug use: No  Types: Marijuana    Comment: hx of-4-5 yrs ago  . Sexual activity: Not Currently  Lifestyle  . Physical activity    Days per week: Not on file    Minutes per session: Not on file  . Stress: Not on file  Relationships  . Social Herbalist on phone: Not on file    Gets together: Not on file    Attends religious service: Not on file    Active member of club or organization: Not on file    Attends meetings of clubs or organizations: Not on file    Relationship status: Not on file  Other Topics Concern  . Not on file  Social History Narrative   Married 33 years in 2015. Lived together for 12 years. 2 boys from first wife. 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        Hobbies: yardwork, [place] barn, woodwork, Orthoptist   Past Surgical History:  Procedure Laterality Date  . ABDOMINAL AORTIC ANEURYSM REPAIR  2010  . CAROTID ENDARTERECTOMY Left 09/20/2004  . CATARACT EXTRACTION, BILATERAL     2016-2017  . COLONOSCOPY    . CORONARY ARTERY BYPASS GRAFT N/A 06/27/2018   Procedure: CORONARY ARTERY BYPASS GRAFTING (CABG), ON PUMP, TIMES FOUR, USING LEFT INTERNAL MAMMARY ARTERY AND ENDOSCOPICALLY HARVESTED LEFT GREATER SAPHENOUS VEIN;  Surgeon: Melrose Nakayama, MD;  Location: [place];  Service: Open Heart Surgery;  Laterality: N/A;  . ENDARTERECTOMY Right 10/12/2015   Procedure: RIGHT CAROTID ENDARTERECTOMY WITH PATCH ANGIOPLASTY;  Surgeon: Elam Dutch, MD;  Location: [place];  Service: Vascular;  Laterality: Right;  . INGUINAL HERNIA REPAIR Left 02/05/2013   Procedure: HERNIA REPAIR INGUINAL ADULT;  Surgeon: Joyice Faster. Cornett, MD;  Location: [place];  Service: General;  Laterality: Left;  . INGUINAL HERNIA REPAIR Left   . INSERTION OF MESH Left 02/05/2013   Procedure: INSERTION OF MESH;  Surgeon: Joyice Faster. Cornett, MD;  Location: [place];  Service: General;  Laterality: Left;  . LEFT HEART CATH AND CORONARY ANGIOGRAPHY N/A 06/21/2018   Procedure: LEFT HEART CATH AND CORONARY ANGIOGRAPHY;  Surgeon: Jettie Booze, MD;  Location: [place] [facility];  Service: Cardiovascular;  Laterality: N/A;  . SHOULDER ARTHROSCOPY WITH ROTATOR CUFF REPAIR AND SUBACROMIAL DECOMPRESSION Left 05/01/2014   Procedure: LEFT ARTHROSCOPY SHOULDER SUBACROMIAL DECOMPRESSION,DISTAL CLAVICAL RESECTION AND ROTATOR CUFF REPAIR;  Surgeon: Marin Shutter, MD;  Location: [place];  Service: Orthopedics;  Laterality: Left;  . TEE WITHOUT CARDIOVERSION N/A 06/27/2018   Procedure: TRANSESOPHAGEAL ECHOCARDIOGRAM (TEE);  Surgeon: Melrose Nakayama, MD;  Location: [place];  Service: Open Heart Surgery;  Laterality: N/A;  . TESTICLAR CYST EXCISION Left   .  TONSILLECTOMY    . VASECTOMY     Past Medical History:  Diagnosis Date  . Arthritis    DDD- Lower Back  . Bradycardia   . CAP (community acquired pneumonia) 03/17/2015   "THAT'S WHAT THEY ARE THINKING; THEY ARE NOT SURE"  . Carotid artery occlusion   . Chest pain 06/19/2018  . Chronic lower back pain    DDD  . CKD (chronic kidney disease)    Dr. Corliss Parish  . 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 Diverticulosis   . Enlarged prostate    takes Flomax daily  . History of blood transfusion    "probably when they did aortic aneurysm"  . History of colon polyps    benign  . Hyperlipidemia   . Hypertension    takes Amlodipine and Zebeta daily  . Insomnia    takes Melatonin nightly  . Joint pain   . 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     . Muscle spasm    takes Zanaflex daily as needed  . Peripheral edema    takes Furosemide daily  . Pneumonia    hx of   . Rheumatic fever 1946  . Rheumatoid arthritis ([place])   . Short-term memory loss    minimal  . Shortness of breath dyspnea    with exertion but lasts very short period of time  . TIA (transient ischemic attack)    x 2;takes Plavix daily  . Urinary frequency    takes Flomax daily  . Urinary urgency    Pulse 67   Temp 98.3 F (36.8 C)   Ht 5\' 7"  (1.702 m)   Wt 182 lb (82.6 kg)   SpO2 95%   BMI 28.51 kg/m   Opioid Risk Score:   Fall Risk Score:  `1  Depression screen PHQ 2/9  Depression screen [facility] 2/9 08/09/2018 04/16/2018 01/22/2018 10/19/2017 10/05/2017 09/25/2017 07/03/2017  Decreased Interest 0 0 0 0 0 0 0  Down, Depressed, Hopeless 0 0 0 0 0 0 0  PHQ - 2 Score 0 0 0 0 0 0 0  Some recent data might be hidden    Review of Systems  Constitutional: Negative.   HENT: Negative.   Eyes: Negative.    Respiratory: Negative.   Cardiovascular: Negative.   Gastrointestinal: Negative.   Endocrine: Negative.   Genitourinary: Negative.   Musculoskeletal: Positive for gait problem.  Skin: Negative.   Allergic/Immunologic: Negative.   Neurological: Positive for tremors and weakness.  Hematological: Negative.   Psychiatric/Behavioral: Negative.   All other systems reviewed and are negative.      Objective:   Physical Exam Vitals signs and nursing note reviewed.  Constitutional:      Appearance: Normal appearance.  Neck:     Musculoskeletal: Normal range of motion and neck supple.  Cardiovascular:     Rate and Rhythm: Normal rate and regular rhythm.     Pulses: Normal pulses.     Heart sounds: Normal heart sounds.  Pulmonary:     Effort: Pulmonary effort is normal.     Breath sounds: Normal breath sounds.  Musculoskeletal:     Comments: Normal Muscle Bulk and Muscle Testing Reveals:  Upper Extremities: Full ROM and Muscle Strength 5/5 , Lumbar Paraspinal Tenderness: L-3-L-5 Lower Extremities: Full ROM and Muscle Strength 5/5 Arises from Table slowly Narrow Based Gait   Skin:    General: Skin is warm and dry.  Neurological:     Mental Status: He is alert and oriented to person, place, and time.  Psychiatric:        Mood and Affect: Mood normal.        Behavior: Behavior normal.           Assessment & Plan:  1. Lumbar Spondylosis/ Lumbar Stenosis:02/14/2019 Continue current medication regime. Refilled:Oxycodone 7.5/325 mg one tabletevery 8 hoursas needed #70.02/14/2019 We will continue the opioid monitoring program, this  consists of regular clinic visits, examinations, urine drug screen, pill counts as well as use of [place] Controlled Substance Reporting System. 2. ChronicBilateralKnee Pain:S/P Bilateral Knee  Injection by Dr Paulla Fore on 01/14/2019, Orthopedist Following Dr. Paulla Fore.02/14/2019 3. Right Hip Pain: Orthopedist Following.No complaints  today.02/14/2019 4. Uncontrolled Hypertension. Mr. Kretschmer states he's compliant with his medication. Instructed to keep blood pressure log and follow up with his PCP he verbalizes understanding. He refuses ED evaluation.   59minutes of face to face patient care time was spent during this visit. All questions were encouraged and answered.  F/U in 1 month

## 2019-02-26 ENCOUNTER — Other Ambulatory Visit: Payer: Self-pay

## 2019-02-26 ENCOUNTER — Encounter: Payer: Self-pay | Admitting: Family Medicine

## 2019-02-26 ENCOUNTER — Ambulatory Visit: Payer: Self-pay

## 2019-02-26 ENCOUNTER — Other Ambulatory Visit: Payer: Self-pay | Admitting: Family Medicine

## 2019-02-26 ENCOUNTER — Ambulatory Visit (INDEPENDENT_AMBULATORY_CARE_PROVIDER_SITE_OTHER)
Admission: RE | Admit: 2019-02-26 | Discharge: 2019-02-26 | Disposition: A | Discharge status: Home / Self Care | Payer: Medicare HMO | Source: Ambulatory Visit | Attending: Family Medicine | Admitting: Family Medicine

## 2019-02-26 ENCOUNTER — Ambulatory Visit (INDEPENDENT_AMBULATORY_CARE_PROVIDER_SITE_OTHER): Payer: Medicare HMO | Admitting: Family Medicine

## 2019-02-26 VITALS — BP 148/88 | HR 64 | Ht 67.0 in | Wt 182.0 lb

## 2019-02-26 DIAGNOSIS — M7062 Trochanteric bursitis, left hip: Secondary | ICD-10-CM | POA: Diagnosis not present

## 2019-02-26 DIAGNOSIS — M25552 Pain in left hip: Secondary | ICD-10-CM

## 2019-02-26 DIAGNOSIS — M170 Bilateral primary osteoarthritis of knee: Secondary | ICD-10-CM | POA: Diagnosis not present

## 2019-02-26 DIAGNOSIS — M1612 Unilateral primary osteoarthritis, left hip: Secondary | ICD-10-CM | POA: Diagnosis not present

## 2019-02-26 MED ORDER — GABAPENTIN 400 MG PO CAPS: 400.0000 mg | ORAL_CAPSULE | Freq: Three times a day (TID) | ORAL | 3 refills | Status: DC

## 2019-02-26 NOTE — Patient Instructions (Addendum)
Good to see you Xray downstairs Gabapentin 400 mg twice a day See me again in 2 months

## 2019-02-26 NOTE — Assessment & Plan Note (Signed)
Patient given injection today, tolerated procedure well, discussed which activities to do which wants to avoid.  Patient is to increase activity slowly.  Follow-up again in 4 to 8 weeks past medical history is significant though for lumbar spinal stenosis but is seen another provider for this.  Epidurals could be beneficial.

## 2019-02-26 NOTE — Progress Notes (Signed)
[name] [address], [place] [address] Phone: [phone] Subjective:   I Kevin [name] am serving as a Education administrator for Dr. Hulan Saas.  I'm seeing this patient by the request  of:    CC: Bilateral knee pain, left hip pain  TIR:[id number]   01/14/2019 Bilateral injections given today.  Tolerated the procedure well.  Discussed icing regimen and home exercises, patient could be a candidate for Visco supplementation.  Discussed continuing the crutch for support.  02/26/2019 BEVIN MAYALL is a 78 y.o. male coming in with complaint of bilateral knee pain. State his knees felt good for about 3 weeks. Left hip pain 12/10 pain. Bilateral hip pain left is worse.  Lateral aspect of the hip.  Wakes him up at night, denies any radiation of the pain.  Known arthritic changes of the knees.  Patient states having 3 weeks of improvement with the steroid injections.  Worsening pain again.  Affecting daily activities, walking with the aid of a cane     Past Medical History:  Diagnosis Date   Arthritis    DDD- Lower Back   Bradycardia    CAP (community acquired pneumonia) 03/17/2015   "THAT'S WHAT THEY ARE THINKING; THEY ARE NOT SURE"   Carotid artery occlusion    Chest pain 06/19/2018   Chronic lower back pain    DDD   CKD (chronic kidney disease)    Dr. Corliss Parish   CKD (chronic kidney disease), stage III ([place]) 01/27/2014   GFR just above 30--> just below 30 11/10/14 refer to nephrology Creatinine 2 baseline but went up to 2.5 peak Likely RAS- lisinopril not great choice    Diverticulosis    Enlarged prostate    takes Flomax daily   History of blood transfusion    "probably when they did aortic aneurysm"   History of colon polyps    benign   Hyperlipidemia    Hypertension    takes Amlodipine and Zebeta daily   Insomnia    takes Melatonin nightly   Joint pain    MORTON'S NEUROMA, RIGHT 11/02/2009   Resolved after injection.       Muscle spasm    takes Zanaflex daily as needed   Peripheral edema    takes Furosemide daily   Pneumonia    hx of    Rheumatic fever 1946   Rheumatoid arthritis ([place])    Short-term memory loss    minimal   Shortness of breath dyspnea    with exertion but lasts very short period of time   TIA (transient ischemic attack)    x 2;takes Plavix daily   Urinary frequency    takes Flomax daily   Urinary urgency    Past Surgical History:  Procedure Laterality Date   ABDOMINAL AORTIC ANEURYSM REPAIR  2010   CAROTID ENDARTERECTOMY Left 09/20/2004   CATARACT EXTRACTION, BILATERAL     2016-2017   COLONOSCOPY     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 Cornett, MD;  Location: [facility];  Service: General;  Laterality: Left;   INGUINAL HERNIA REPAIR Left    INSERTION OF MESH Left 02/05/2013   Procedure: INSERTION OF MESH;  Surgeon: Joyice Faster. Cornett, MD;  Location: [facility];  Service: General;  Laterality: Left;   LEFT HEART CATH AND CORONARY ANGIOGRAPHY N/A 06/21/2018   Procedure: LEFT HEART CATH AND CORONARY ANGIOGRAPHY;  Surgeon: Jettie Booze, MD;  Location: [facility];  Service: Cardiovascular;  Laterality: N/A;   SHOULDER ARTHROSCOPY WITH ROTATOR CUFF REPAIR AND SUBACROMIAL DECOMPRESSION Left 05/01/2014   Procedure: LEFT ARTHROSCOPY SHOULDER SUBACROMIAL  DECOMPRESSION,DISTAL CLAVICAL RESECTION AND ROTATOR CUFF REPAIR;  Surgeon: Marin Shutter, MD;  Location: [facility];  Service: Orthopedics;  Laterality: Left;   TEE WITHOUT CARDIOVERSION N/A 06/27/2018   Procedure: TRANSESOPHAGEAL ECHOCARDIOGRAM (TEE);  Surgeon: Melrose Nakayama, MD;  Location: [facility];  Service: Open Heart Surgery;  Laterality: N/A;   TESTICLAR CYST EXCISION Left    TONSILLECTOMY     VASECTOMY     Social History   Socioeconomic History   Marital status: Married    Spouse name: Not on file   Number of children: Not on file   Years of education: Not on file   Highest education level: Not on file  Occupational History   Occupation: retired    Comment: maintenance; Psychologist, sport and exercise strain: Not on file   Food insecurity    Worry: Not on file    Inability: Not on file   Transportation needs    Medical: Not on file    Non-medical: Not on file  Tobacco Use   Smoking status: Former Smoker    Packs/day: 2.00    Years: 28.00    Pack years: 56.00    Types: Cigarettes    Quit date: 05/31/1987    Years since quitting: 31.7   Smokeless tobacco: Never Used   Tobacco comment: quit smoking "in my 52's"  Substance and Sexual Activity   Alcohol use: Yes    Alcohol/week: 0.0 standard drinks    Comment: rare, mikes hard lemonade   Drug use: No    Types: Marijuana    Comment: hx of-4-5 yrs ago   Sexual activity: Not Currently  Lifestyle   Physical activity    Days per week: Not on file    Minutes per session: Not on file   Stress: Not on file  Relationships   Social connections    Talks on phone: Not on file    Gets together: Not on file    Attends religious service: Not on file    Active member of club or organization: Not on file    Attends meetings of clubs or organizations: Not on file    Relationship status: Not on file  Other Topics Concern   Not on file  Social History Narrative   Married 33 years in 2015.  Lived together for 12 years. 2 boys from first wife. 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       Hobbies: yardwork, [place] barn, woodwork, collect knive   No Known Allergies Family History  Problem Relation Age of Onset   Parkinsonism Father    Dementia Father    Cancer Father  Throat   Colon cancer Mother        died in 39s   Cancer Mother        Colon     Current Outpatient Medications (Cardiovascular):    atorvastatin (LIPITOR) 20 MG tablet, TAKE 1 TABLET EVERY DAY   fenofibrate 160 MG tablet, TAKE 1 TABLET EVERY DAY (Patient taking differently: Take 160 mg by mouth daily. )   furosemide (LASIX) 20 MG tablet, Take 1 tablet (20 mg total) by mouth daily.   metoprolol tartrate (LOPRESSOR) 25 MG tablet, Take 25 mg by mouth 2 (two) times daily.   Current Outpatient Medications (Respiratory):    albuterol (VENTOLIN HFA) 108 (90 Base) MCG/ACT inhaler, Inhale 2 puffs into the lungs every 12 (twelve) hours as needed for wheezing or shortness of breath.   benzonatate (TESSALON) 100 MG capsule, Take 1 capsule (100 mg total) by mouth 2 (two) times daily as needed for cough.  Current Outpatient Medications (Analgesics):    aspirin EC 81 MG EC tablet, Take 1 tablet (81 mg total) by mouth daily.   oxyCODONE-acetaminophen (PERCOCET) 7.5-325 MG tablet, Take 1 tablet by mouth every 8 (eight) hours as needed for severe pain. Do Not Fill Before 02/17/2019  Current Outpatient Medications (Hematological):    vitamin B-12 (CYANOCOBALAMIN) 1000 MCG tablet, Take 1,000 mcg by mouth daily.  Current Outpatient Medications (Other):    BIOTIN PO, Take 1 tablet by mouth daily.   Cholecalciferol (VITAMIN D-3) 1000 UNITS CAPS, Take 1,000 Units by mouth daily.    diazepam (VALIUM) 5 MG tablet, TAKE 1/2 TO 1 TABLET q8 hours PRN pain sleep/muscle spasm-only if wife is  around+can watch you closely when taking due to sedation risk (Patient taking differently: Take 2.5-5 mg by mouth See admin instructions. Take 2.5-5mg  every 8 hours as needed for pain sleep/muscle spasm-only if wife is around+can watch pt closely when taking due to sedation risk)   gabapentin (NEURONTIN) 300 MG capsule, Take 1 capsule (300 mg total) by mouth 2 (two) times daily.   linaclotide (LINZESS) 145 MCG CAPS capsule, Take 1 capsule (145 mcg total) by mouth daily before breakfast.   Melatonin 10 MG TABS, Take 10 mg by mouth at bedtime.    tamsulosin (FLOMAX) 0.4 MG CAPS capsule, TAKE 1 CAPSULE EVERY DAY (Patient taking differently: Take 0.4 mg by mouth daily. )   tiZANidine (ZANAFLEX) 4 MG tablet, TAKE 1 TABLET EVERY 6 HOURS AS NEEDED FOR MUSCLE SPASM(S) (Patient taking differently: Take 4 mg by mouth at bedtime. )   gabapentin (NEURONTIN) 400 MG capsule, Take 1 capsule (400 mg total) by mouth 3 (three) times daily. 400 mg twice a day    Past medical history, social, surgical and family history all reviewed in electronic medical record.  No pertanent information unless stated regarding to the chief complaint.   Review of Systems:  No headache, visual changes, nausea, vomiting, diarrhea, constipation, dizziness, abdominal pain, skin rash, fevers, chills, night sweats, weight loss, swollen lymph nodes, body aches, joint swelling, chest pain, shortness of breath, mood changes.  Positive muscle aches  Objective  Blood pressure (!) 148/88, pulse 64, height 5\' 7"  (1.702 m), weight 182 lb (82.6 kg), SpO2 94 %.    General: No apparent distress alert and oriented x3 mood and affect normal, dressed appropriately.  HEENT: Pupils equal, extraocular movements intact  Respiratory: Patient's speak in full sentences and does not appear short of breath  Cardiovascular: No lower extremity edema, non tender, no erythema  Skin: Warm  dry intact with no signs of infection or rash on extremities or on  axial skeleton.  Abdomen: Soft nontender  Neuro: Cranial nerves II through XII are intact, neurovascularly intact in all extremities with 2+ DTRs and 2+ pulses.  Lymph: No lymphadenopathy of posterior or anterior cervical chain or axillae bilaterally.  Gait severely antalgic MSK:  tender with limited range of motion and good stability and symmetric strength and tone of shoulders, elbows, wrist, and ankles bilaterally.  Knee: Bilateral valgus deformity noted.  Abnormal thigh to calf ratio.  Tender to palpation over medial and PF joint line.  ROM full in flexion and extension and lower leg rotation. instability with valgus force.  painful patellar compression. Patellar glide with moderate crepitus. Patellar and quadriceps tendons unremarkable. Hamstring and quadriceps strength is normal.  Left hip exam shows the patient does have decreased range of motion in all planes.  Severe tenderness to palpation over the left greater trochanteric area.  Mild over the gluteal area.  Mild pain over the sacroiliac joint.  Patient does have significant tightness of the paraspinal musculature lumbar spine.  After informed written and verbal consent, patient was seated on exam table. Right knee was prepped with alcohol swab and utilizing anterolateral approach, patient's right knee space was injected with 22 mg/mL of Monovisc (sodium hyaluronate) in a prefilled syringe was injected easily into the knee through a 22-gauge needle..Patient tolerated the procedure well without immediate complications.  After informed written and verbal consent, patient was seated on exam table. Left knee was prepped with alcohol swab and utilizing anterolateral approach, patient's left knee space was injected with 22 mg/mL of Monovisc (sodium hyaluronate) in a prefilled syringe was injected easily into the knee through a 22-gauge needle..Patient tolerated the procedure well without immediate complications.   Procedure: Real-time  Ultrasound Guided Injection of left  greater trochanteric bursitis secondary to patient's body habitus Device: GE Logiq Q7  Ultrasound guided injection is preferred based studies that show increased duration, increased effect, greater accuracy, decreased procedural pain, increased response rate, and decreased cost with ultrasound guided versus blind injection.  Verbal informed consent obtained.  Time-out conducted.  Noted no overlying erythema, induration, or other signs of local infection.  Skin prepped in a sterile fashion.  Local anesthesia: Topical Ethyl chloride.  With sterile technique and under real time ultrasound guidance:  Greater trochanteric area was visualized and patient's bursa was noted. A 22-gauge 3 inch needle was inserted and 4 cc of 0.5% Marcaine and 1 cc of Kenalog 40 mg/dL was injected. Pictures taken Completed without difficulty  Pain immediately resolved suggesting accurate placement of the medication.  Advised to call if fevers/chills, erythema, induration, drainage, or persistent bleeding.  Images permanently stored and available for review in the ultrasound unit.  Impression: Technically successful ultrasound guided injection.    Impression and Recommendations:     This case required medical decision making of moderate complexity. The above documentation has been reviewed and is accurate and complete Lyndal Pulley, DO       Note: This dictation was prepared with Dragon dictation along with smaller phrase technology. Any transcriptional errors that result from this process are unintentional.

## 2019-02-26 NOTE — Assessment & Plan Note (Signed)
Bilateral viscosupplementation given today.  Tolerated the procedure well.  Discussed icing regimen and home exercises, discussed which activities of doing which wants to avoid.  Patient will increase activity as tolerated.  Follow-up again in 4 to 8 weeks

## 2019-03-18 ENCOUNTER — Encounter: Payer: Self-pay | Admitting: Registered Nurse

## 2019-03-18 ENCOUNTER — Encounter: Payer: Medicare HMO | Attending: Physical Medicine & Rehabilitation | Admitting: Registered Nurse

## 2019-03-18 ENCOUNTER — Other Ambulatory Visit: Payer: Self-pay

## 2019-03-18 VITALS — BP 143/76 | HR 56 | Temp 96.8°F | Ht 67.5 in | Wt 187.0 lb

## 2019-03-18 DIAGNOSIS — Z5181 Encounter for therapeutic drug level monitoring: Secondary | ICD-10-CM | POA: Diagnosis not present

## 2019-03-18 DIAGNOSIS — I739 Peripheral vascular disease, unspecified: Secondary | ICD-10-CM | POA: Diagnosis not present

## 2019-03-18 DIAGNOSIS — M47816 Spondylosis without myelopathy or radiculopathy, lumbar region: Secondary | ICD-10-CM | POA: Diagnosis not present

## 2019-03-18 DIAGNOSIS — M1712 Unilateral primary osteoarthritis, left knee: Secondary | ICD-10-CM | POA: Diagnosis not present

## 2019-03-18 DIAGNOSIS — G894 Chronic pain syndrome: Secondary | ICD-10-CM | POA: Diagnosis not present

## 2019-03-18 DIAGNOSIS — Z79891 Long term (current) use of opiate analgesic: Secondary | ICD-10-CM | POA: Diagnosis not present

## 2019-03-18 DIAGNOSIS — M25551 Pain in right hip: Secondary | ICD-10-CM | POA: Diagnosis not present

## 2019-03-18 MED ORDER — OXYCODONE-ACETAMINOPHEN 7.5-325 MG PO TABS: 1.0000 | ORAL_TABLET | Freq: Three times a day (TID) | ORAL | 0 refills | Status: DC | PRN

## 2019-03-18 NOTE — Progress Notes (Signed)
Subjective:    Patient ID: Kevin [name], male    DOB: 05-19-1941, 78 y.o.   MRN: [id number]  HPI: Kevin [name] is a 78 y.o. male who returns for follow up appointment for chronic pain and medication refill. He states his pain is located in his lower back and left knee. She rates her pain 3. Her . current exercise regime is walking and performing stretching exercises.  [name] Morphine equivalent is 32.81   MME.  Oral Swab was Performed today.    Pain Inventory Average Pain 8 Pain Right Now 3 My pain is aching  In the last 24 hours, has pain interfered with the following? General activity 7 Relation with others 4 Enjoyment of life 8 What TIME of day is your pain at its worst? varies with activity, afternoon Sleep (in general) Fair  Pain is worse with: walking and bending Pain improves with: rest, heat/ice and medication Relief from Meds: 5  Mobility walk with assistance use a cane how many minutes can you walk? 5 Do you have any goals in this area?  yes  Function retired I need assistance with the following:  household duties and shopping  Neuro/Psych weakness tingling trouble walking  Prior Studies Any changes since last visit?  no  Physicians involved in your care Any changes since last visit?  no   Family History  Problem Relation Age of Onset  . Parkinsonism Father   . Dementia Father   . Cancer Father        Throat  . Colon cancer Mother        died in 77s  . Cancer Mother        Colon   Social History   Socioeconomic History  . Marital status: Married    Spouse name: Not on file  . Number of children: Not on file  . Years of education: Not on file  . Highest education level: Not on file  Occupational History  . Occupation: retired    Comment: maintenance; electrician  Social Needs  . Financial resource strain: Not on file  . Food insecurity    Worry: Not on file    Inability: Not on file  . Transportation needs    Medical: Not on file     Non-medical: Not on file  Tobacco Use  . Smoking status: Former Smoker    Packs/day: 2.00    Years: 28.00    Pack years: 56.00    Types: Cigarettes    Quit date: 05/31/1987    Years since quitting: 31.8  . Smokeless tobacco: Never Used  . Tobacco comment: quit smoking "in my 67's"  Substance and Sexual Activity  . Alcohol use: Yes    Alcohol/week: 0.0 standard drinks    Comment: rare, mikes hard lemonade  . Drug use: No    Types: Marijuana    Comment: hx of-4-5 yrs ago  . Sexual activity: Not Currently  Lifestyle  . Physical activity    Days per week: Not on file    Minutes per session: Not on file  . Stress: Not on file  Relationships  . Social Herbalist on phone: Not on file    Gets together: Not on file    Attends religious service: Not on file    Active member of club or organization: Not on file    Attends meetings of clubs or organizations: Not on file    Relationship status: Not on file  Other Topics Concern  . Not on file  Social History Narrative   Married 33 years in 2015. Lived together for 12 years. 2 boys from first wife. 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       Hobbies: yardwork, [place] barn, woodwork, Orthoptist   Past Surgical History:  Procedure Laterality Date  . ABDOMINAL AORTIC ANEURYSM REPAIR  2010  . CAROTID ENDARTERECTOMY Left 09/20/2004  . CATARACT EXTRACTION, BILATERAL     2016-2017  . COLONOSCOPY    . 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 ENDARTERECTOMY Right 10/12/2015   Procedure: RIGHT CAROTID ENDARTERECTOMY WITH PATCH ANGIOPLASTY;   Surgeon: Elam Dutch, MD;  Location: [facility];  Service: Vascular;  Laterality: Right;  . INGUINAL HERNIA REPAIR Left 02/05/2013   Procedure: HERNIA REPAIR INGUINAL ADULT;  Surgeon: Joyice Faster. Cornett, MD;  Location: [facility];  Service: General;  Laterality: Left;  . INGUINAL HERNIA REPAIR Left   . INSERTION OF MESH Left 02/05/2013   Procedure: INSERTION OF MESH;  Surgeon: Joyice Faster. Cornett, MD;  Location: [facility];  Service: General;  Laterality: Left;  . LEFT HEART CATH AND CORONARY ANGIOGRAPHY N/A 06/21/2018   Procedure: LEFT HEART CATH AND CORONARY ANGIOGRAPHY;  Surgeon: Jettie Booze, MD;  Location: [facility];  Service: Cardiovascular;  Laterality: N/A;  . SHOULDER ARTHROSCOPY WITH ROTATOR CUFF REPAIR AND SUBACROMIAL DECOMPRESSION Left 05/01/2014   Procedure: LEFT ARTHROSCOPY SHOULDER SUBACROMIAL DECOMPRESSION,DISTAL CLAVICAL RESECTION AND ROTATOR CUFF REPAIR;  Surgeon: Marin Shutter, MD;  Location: [facility];  Service: Orthopedics;  Laterality: Left;  . TEE WITHOUT CARDIOVERSION N/A 06/27/2018   Procedure: TRANSESOPHAGEAL ECHOCARDIOGRAM (TEE);  Surgeon: Melrose Nakayama, MD;  Location: [facility];  Service: Open Heart Surgery;  Laterality: N/A;  . TESTICLAR CYST EXCISION Left   . TONSILLECTOMY    . VASECTOMY     Past Medical History:  Diagnosis Date  . Arthritis    DDD- Lower Back  . Bradycardia   . CAP (community acquired pneumonia) 03/17/2015   "THAT'S WHAT THEY ARE THINKING; THEY ARE NOT SURE"  . Carotid artery occlusion   . Chest pain 06/19/2018  . Chronic lower back pain    DDD  . CKD (chronic kidney disease)    Dr. Corliss Parish  . CKD (chronic kidney disease), stage III ([place]) 01/27/2014   GFR just above 30--> just below 30 11/10/14 refer to nephrology Creatinine 2 baseline but went up to 2.5 peak Likely RAS- lisinopril not great choice   . Diverticulosis   . Enlarged prostate    takes Flomax daily  . History of blood  transfusion    "probably when they did aortic aneurysm"  . History of colon polyps    benign  . Hyperlipidemia   . Hypertension    takes Amlodipine and Zebeta daily  . Insomnia    takes Melatonin nightly  . Joint pain   . 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     . Muscle spasm    takes Zanaflex daily as needed  .  Peripheral edema    takes Furosemide daily  . Pneumonia    hx of   . Rheumatic fever 1946  . Rheumatoid arthritis ([place])   . Short-term memory loss    minimal  . Shortness of breath dyspnea    with exertion but lasts very short period of time  . TIA (transient ischemic attack)    x 2;takes Plavix daily  . Urinary frequency    takes Flomax daily  . Urinary urgency    There were no vitals taken for this visit.  Opioid Risk Score:   Fall Risk Score:  `1  Depression screen PHQ 2/9  Depression screen [facility] 2/9 08/09/2018 04/16/2018 01/22/2018 10/19/2017 10/05/2017 09/25/2017 07/03/2017  Decreased Interest 0 0 0 0 0 0 0  Down, Depressed, Hopeless 0 0 0 0 0 0 0  PHQ - 2 Score 0 0 0 0 0 0 0  Some recent data might be hidden    Review of Systems  Constitutional: Negative.   HENT: Negative.   Eyes: Negative.   Respiratory: Negative.   Cardiovascular: Negative.   Gastrointestinal: Negative.   Genitourinary: Positive for difficulty urinating.  Musculoskeletal: Positive for arthralgias and back pain.  Neurological: Positive for tremors and weakness.       Tingling  All other systems reviewed and are negative.      Objective:   Physical Exam Vitals signs and nursing note reviewed.  Constitutional:      Appearance: Normal appearance.  Neck:     Musculoskeletal: Normal range of motion and neck supple.  Cardiovascular:     Rate and Rhythm: Normal rate and regular rhythm.     Pulses: Normal pulses.     Heart sounds: Normal heart sounds.  Pulmonary:     Effort: Pulmonary effort is normal.     Breath sounds: Normal breath sounds.   Musculoskeletal:     Comments: Normal Muscle Bulk and Muscle Testing Reveals:  Upper Extremities: Full ROM and Muscle Strength 5/5 , Lumbar Paraspinal Tenderness: L-3-L-5 Lower Extremities: Full ROM and Muscle Strength 5/5 Left Lower Extremity Flexion Produces Pain inbto her Left Patella Arises from chair slowly using cane for support Narrow Based Gait   Skin:    General: Skin is warm and dry.  Neurological:     Mental Status: He is alert and oriented to person, place, and time.  Psychiatric:        Mood and Affect: Mood normal.        Behavior: Behavior normal.           Assessment & Plan:  1. Lumbar Spondylosis/ Lumbar Stenosis:10/119/2020 Continue current medication regime. Refilled:Oxycodone 7.5/325 mg one tabletevery 8 hoursas needed #70.03/18/2019 We will continue the opioid monitoring program, this consists of regular clinic visits, examinations, urine drug screen, pill counts as well as use of [place] Controlled Substance Reporting System. 2. ChronicBilateralKnee Pain:S/PBilateral KneeInjection by Dr Paulla Fore on 02/26/2019, Orthopedist Following Dr. Paulla Fore.03/18/2019 3. Right Hip Pain: Orthopedist Following.No complaints today.03/18/2019 4. Bradycardia: PCP Following he reports.   58minutes of face to face patient care time was spent during this visit. All questions were encouraged and answered.  F/U in 1 month

## 2019-03-22 DIAGNOSIS — N184 Chronic kidney disease, stage 4 (severe): Secondary | ICD-10-CM | POA: Diagnosis not present

## 2019-03-22 DIAGNOSIS — I129 Hypertensive chronic kidney disease with stage 1 through stage 4 chronic kidney disease, or unspecified chronic kidney disease: Secondary | ICD-10-CM | POA: Diagnosis not present

## 2019-03-22 DIAGNOSIS — R5383 Other fatigue: Secondary | ICD-10-CM | POA: Diagnosis not present

## 2019-03-22 DIAGNOSIS — Z6831 Body mass index (BMI) 31.0-31.9, adult: Secondary | ICD-10-CM | POA: Diagnosis not present

## 2019-03-22 DIAGNOSIS — E785 Hyperlipidemia, unspecified: Secondary | ICD-10-CM | POA: Diagnosis not present

## 2019-03-22 DIAGNOSIS — N183 Chronic kidney disease, stage 3 unspecified: Secondary | ICD-10-CM | POA: Diagnosis not present

## 2019-03-23 LAB — DRUG TOX MONITOR 1 W/CONF, ORAL FLD
Alprazolam: NEGATIVE ng/mL (ref <0.50)
Amphetamines: NEGATIVE ng/mL (ref <10)
Barbiturates: NEGATIVE ng/mL (ref <10)
Benzodiazepines: POSITIVE ng/mL — AB (ref <0.50)
Buprenorphine: NEGATIVE ng/mL (ref <0.10)
Chlordiazepoxide: NEGATIVE ng/mL (ref <0.50)
Clonazepam: NEGATIVE ng/mL (ref <0.50)
Cocaine: NEGATIVE ng/mL (ref <5.0)
Codeine: NEGATIVE ng/mL (ref <2.5)
Diazepam: 1.03 ng/mL — ABNORMAL HIGH (ref <0.50)
Dihydrocodeine: NEGATIVE ng/mL (ref <2.5)
Fentanyl: NEGATIVE ng/mL (ref <0.10)
Flunitrazepam: NEGATIVE ng/mL (ref <0.50)
Flurazepam: NEGATIVE ng/mL (ref <0.50)
Heroin Metabolite: NEGATIVE ng/mL (ref <1.0)
Hydrocodone: NEGATIVE ng/mL (ref <2.5)
Hydromorphone: NEGATIVE ng/mL (ref <2.5)
Lorazepam: NEGATIVE ng/mL (ref <0.50)
MARIJUANA: NEGATIVE ng/mL (ref <2.5)
MDMA: NEGATIVE ng/mL (ref <10)
Meprobamate: NEGATIVE ng/mL (ref <2.5)
Methadone: NEGATIVE ng/mL (ref <5.0)
Midazolam: NEGATIVE ng/mL (ref <0.50)
Morphine: NEGATIVE ng/mL (ref <2.5)
Nicotine Metabolite: NEGATIVE ng/mL (ref <5.0)
Nordiazepam: 1.96 ng/mL — ABNORMAL HIGH (ref <0.50)
Norhydrocodone: NEGATIVE ng/mL (ref <2.5)
Noroxycodone: 2.8 ng/mL — ABNORMAL HIGH (ref <2.5)
Opiates: POSITIVE ng/mL — AB (ref <2.5)
Oxazepam: NEGATIVE ng/mL (ref <0.50)
Oxycodone: 61.7 ng/mL — ABNORMAL HIGH (ref <2.5)
Oxymorphone: NEGATIVE ng/mL (ref <2.5)
Phencyclidine: NEGATIVE ng/mL (ref <10)
Tapentadol: NEGATIVE ng/mL (ref <5.0)
Temazepam: NEGATIVE ng/mL (ref <0.50)
Tramadol: NEGATIVE ng/mL (ref <5.0)
Triazolam: NEGATIVE ng/mL (ref <0.50)
Zolpidem: NEGATIVE ng/mL (ref <5.0)

## 2019-03-23 LAB — DRUG TOX ALC METAB W/CON, ORAL FLD: Alcohol Metabolite: NEGATIVE ng/mL (ref <25)

## 2019-03-27 ENCOUNTER — Telehealth: Payer: Self-pay | Admitting: *Deleted

## 2019-03-27 NOTE — Telephone Encounter (Signed)
Oral swab drug screen was consistent for prescribed medications.  ?

## 2019-04-05 ENCOUNTER — Other Ambulatory Visit: Payer: Self-pay | Admitting: Family Medicine

## 2019-04-16 ENCOUNTER — Ambulatory Visit (INDEPENDENT_AMBULATORY_CARE_PROVIDER_SITE_OTHER): Payer: Medicare HMO

## 2019-04-16 ENCOUNTER — Encounter: Payer: Self-pay | Admitting: Family Medicine

## 2019-04-16 ENCOUNTER — Other Ambulatory Visit: Payer: Self-pay

## 2019-04-16 ENCOUNTER — Ambulatory Visit (INDEPENDENT_AMBULATORY_CARE_PROVIDER_SITE_OTHER): Payer: Medicare HMO | Admitting: Family Medicine

## 2019-04-16 VITALS — BP 136/78 | HR 64 | Temp 97.4°F | Ht 68.0 in | Wt 188.0 lb

## 2019-04-16 DIAGNOSIS — R5383 Other fatigue: Secondary | ICD-10-CM | POA: Diagnosis not present

## 2019-04-16 DIAGNOSIS — J189 Pneumonia, unspecified organism: Secondary | ICD-10-CM

## 2019-04-16 DIAGNOSIS — G8190 Hemiplegia, unspecified affecting unspecified side: Secondary | ICD-10-CM | POA: Insufficient documentation

## 2019-04-16 DIAGNOSIS — Z0000 Encounter for general adult medical examination without abnormal findings: Secondary | ICD-10-CM

## 2019-04-16 DIAGNOSIS — R252 Cramp and spasm: Secondary | ICD-10-CM

## 2019-04-16 DIAGNOSIS — I69852 Hemiplegia and hemiparesis following other cerebrovascular disease affecting left dominant side: Secondary | ICD-10-CM | POA: Diagnosis not present

## 2019-04-16 DIAGNOSIS — R05 Cough: Secondary | ICD-10-CM | POA: Diagnosis not present

## 2019-04-16 DIAGNOSIS — D649 Anemia, unspecified: Secondary | ICD-10-CM

## 2019-04-16 LAB — COMPREHENSIVE METABOLIC PANEL
ALT: 19 U/L (ref 0–53)
AST: 21 U/L (ref 0–37)
Albumin: 4.1 g/dL (ref 3.5–5.2)
Alkaline Phosphatase: 35 U/L — ABNORMAL LOW (ref 39–117)
BUN: 25 mg/dL — ABNORMAL HIGH (ref 6–23)
CO2: 27 mEq/L (ref 19–32)
Calcium: 8.8 mg/dL (ref 8.4–10.5)
Chloride: 104 mEq/L (ref 96–112)
Creatinine, Ser: 2.08 mg/dL — ABNORMAL HIGH (ref 0.40–1.50)
GFR: 30.85 mL/min — ABNORMAL LOW (ref >60.00)
Glucose, Bld: 107 mg/dL — ABNORMAL HIGH (ref 70–99)
Potassium: 4.5 mEq/L (ref 3.5–5.1)
Sodium: 138 mEq/L (ref 135–145)
Total Bilirubin: 0.5 mg/dL (ref 0.2–1.2)
Total Protein: 6.6 g/dL (ref 6.0–8.3)

## 2019-04-16 LAB — CBC WITH DIFFERENTIAL/PLATELET
Basophils Absolute: 0.1 10*3/uL (ref 0.0–0.1)
Basophils Relative: 1.4 % (ref 0.0–3.0)
Eosinophils Absolute: 0.5 10*3/uL (ref 0.0–0.7)
Eosinophils Relative: 6.2 % — ABNORMAL HIGH (ref 0.0–5.0)
HCT: 37.3 % — ABNORMAL LOW (ref 39.0–52.0)
Hemoglobin: 12.3 g/dL — ABNORMAL LOW (ref 13.0–17.0)
Lymphocytes Relative: 34.3 % (ref 12.0–46.0)
Lymphs Abs: 2.7 10*3/uL (ref 0.7–4.0)
MCHC: 33.1 g/dL (ref 30.0–36.0)
MCV: 92.6 fl (ref 78.0–100.0)
Monocytes Absolute: 0.6 10*3/uL (ref 0.1–1.0)
Monocytes Relative: 8 % (ref 3.0–12.0)
Neutro Abs: 4 10*3/uL (ref 1.4–7.7)
Neutrophils Relative %: 50.1 % (ref 43.0–77.0)
Platelets: 262 10*3/uL (ref 150.0–400.0)
RBC: 4.03 Mil/uL — ABNORMAL LOW (ref 4.22–5.81)
RDW: 15.1 % (ref 11.5–15.5)
WBC: 7.9 10*3/uL (ref 4.0–10.5)

## 2019-04-16 LAB — TSH: TSH: 2.42 u[IU]/mL (ref 0.35–4.50)

## 2019-04-16 LAB — MAGNESIUM: Magnesium: 2 mg/dL (ref 1.5–2.5)

## 2019-04-16 LAB — IBC + FERRITIN
Ferritin: 169.1 ng/mL (ref 22.0–322.0)
Iron: 137 ug/dL (ref 42–165)
Saturation Ratios: 30.4 % (ref 20.0–50.0)
Transferrin: 322 mg/dL (ref 212.0–360.0)

## 2019-04-16 MED ORDER — LINACLOTIDE 145 MCG PO CAPS: ORAL_CAPSULE | ORAL | 3 refills | Status: DC

## 2019-04-16 MED ORDER — GABAPENTIN 400 MG PO CAPS: 400.0000 mg | ORAL_CAPSULE | Freq: Two times a day (BID) | ORAL | 3 refills | Status: DC

## 2019-04-16 MED ORDER — METOPROLOL TARTRATE 25 MG PO TABS: 25.0000 mg | ORAL_TABLET | Freq: Two times a day (BID) | ORAL | 3 refills | Status: DC

## 2019-04-16 NOTE — Progress Notes (Signed)
I have reviewed and agree with note, evaluation, plan.   Cognitive testing was not performed-can follow-up cognitive testing to be completed by phone?  If any abnormalities please have patient schedule follow-up visit with me within the next month-would recommend MMSE in person  Garret Reddish, MD

## 2019-04-16 NOTE — Patient Instructions (Addendum)
Health Maintenance Due  Topic Date Due  . COLONOSCOPY -would consider if worsening anemia 06/20/2018  . INFLUENZA VACCINE -declines flu shot 12/29/2018   Please check with your pharmacy to see if they have the shingrix vaccine. If they do- please get this immunization and update [place] by phone call or mychart with dates you receive the vaccine  Please stop by lab before you go as well as x-ray If you do not have mychart- we will call you about results within 5 business days of [place] receiving them.  If you have mychart- we will send your results within 3 business days of [place] receiving them.  If abnormal or we want to clarify a result, we will call or mychart you to make sure you receive the message.  If you have questions or concerns or don't hear within 5-7 days, please send [place] a message or call us.

## 2019-04-16 NOTE — Patient Instructions (Signed)
[name] , Thank you for taking time to come for your Medicare Wellness Visit. I appreciate your ongoing commitment to your health goals. Please review the following plan we discussed and let me know if I can assist you in the future.   Screening recommendations/referrals: Colorectal Screening: up to date; last colonoscopy 06/20/13  Vision and Dental Exams: Recommended annual ophthalmology exams for early detection of glaucoma and other disorders of the eye Recommended annual dental exams for proper oral hygiene  Vaccinations: Influenza vaccine:  recommended this fall either at PCP office or through your local pharmacy  Pneumococcal vaccine: up to date; last 12/22/14 Tdap vaccine: up to date; last 07/06/17  Shingles vaccine: Please call your insurance company to determine your out of pocket expense for the Shingrix vaccine. You may receive this vaccine at your local pharmacy.  Advanced directives: Please bring a copy of your POA (Power of Attorney) and/or Living Will to your next appointment.  Goals: Recommend to drink at least 6-8 8oz glasses of water per day and consume a balanced diet rich in fresh fruits and vegetables.   Next appointment: Please schedule your Annual Wellness Visit with your Nurse Health Advisor in one year.  Preventive Care 80 Years and Older, Male Preventive care refers to lifestyle choices and visits with your health care provider that can promote health and wellness. What does preventive care include?  A yearly physical exam. This is also called an annual well check.  Dental exams once or twice a year.  Routine eye exams. Ask your health care provider how often you should have your eyes checked.  Personal lifestyle choices, including:  Daily care of your teeth and gums.  Regular physical activity.  Eating a healthy diet.  Avoiding tobacco and drug use.  Limiting alcohol use.  Practicing safe sex.  Taking low doses of aspirin every day if recommended  by your health care provider..  Taking vitamin and mineral supplements as recommended by your health care provider. What happens during an annual well check? The services and screenings done by your health care provider during your annual well check will depend on your age, overall health, lifestyle risk factors, and family history of disease. Counseling  Your health care provider may ask you questions about your:  Alcohol use.  Tobacco use.  Drug use.  Emotional well-being.  Home and relationship well-being.  Sexual activity.  Eating habits.  History of falls.  Memory and ability to understand (cognition).  Work and work Statistician. Screening  You may have the following tests or measurements:  Height, weight, and BMI.  Blood pressure.  Lipid and cholesterol levels. These may be checked every 5 years, or more frequently if you are over 51 years old.  Skin check.  Lung cancer screening. You may have this screening every year starting at age 38 if you have a 30-pack-year history of smoking and currently smoke or have quit within the past 15 years.  Fecal occult blood test (FOBT) of the stool. You may have this test every year starting at age 63.  Flexible sigmoidoscopy or colonoscopy. You may have a sigmoidoscopy every 5 years or a colonoscopy every 10 years starting at age 11.  Prostate cancer screening. Recommendations will vary depending on your family history and other risks.  Hepatitis C blood test.  Hepatitis B blood test.  Sexually transmitted disease (STD) testing.  Diabetes screening. This is done by checking your blood sugar (glucose) after you have not eaten for a while (  fasting). You may have this done every 1-3 years.  Abdominal aortic aneurysm (AAA) screening. You may need this if you are a current or former smoker.  Osteoporosis. You may be screened starting at age 64 if you are at high risk. Talk with your health care provider about your test  results, treatment options, and if necessary, the need for more tests. Vaccines  Your health care provider may recommend certain vaccines, such as:  Influenza vaccine. This is recommended every year.  Tetanus, diphtheria, and acellular pertussis (Tdap, Td) vaccine. You may need a Td booster every 10 years.  Zoster vaccine. You may need this after age 90.  Pneumococcal 13-valent conjugate (PCV13) vaccine. One dose is recommended after age 48.  Pneumococcal polysaccharide (PPSV23) vaccine. One dose is recommended after age 91. Talk to your health care provider about which screenings and vaccines you need and how often you need them. This information is not intended to replace advice given to you by your health care provider. Make sure you discuss any questions you have with your health care provider. Document Released: 06/12/2015 Document Revised: 02/03/2016 Document Reviewed: 03/17/2015 Elsevier Interactive Patient Education  2017 [place] Prevention in the Home Falls can cause injuries. They can happen to people of all ages. There are many things you can do to make your home safe and to help prevent falls. What can I do on the outside of my home?  Regularly fix the edges of walkways and driveways and fix any cracks.  Remove anything that might make you trip as you walk through a door, such as a raised step or threshold.  Trim any bushes or trees on the path to your home.  Use bright outdoor lighting.  Clear any walking paths of anything that might make someone trip, such as rocks or tools.  Regularly check to see if handrails are loose or broken. Make sure that both sides of any steps have handrails.  Any raised decks and porches should have guardrails on the edges.  Have any leaves, snow, or ice cleared regularly.  Use sand or salt on walking paths during winter.  Clean up any spills in your garage right away. This includes oil or grease spills. What can I do in  the bathroom?  Use night lights.  Install grab bars by the toilet and in the tub and shower. Do not use towel bars as grab bars.  Use non-skid mats or decals in the tub or shower.  If you need to sit down in the shower, use a plastic, non-slip stool.  Keep the floor dry. Clean up any water that spills on the floor as soon as it happens.  Remove soap buildup in the tub or shower regularly.  Attach bath mats securely with double-sided non-slip rug tape.  Do not have throw rugs and other things on the floor that can make you trip. What can I do in the bedroom?  Use night lights.  Make sure that you have a light by your bed that is easy to reach.  Do not use any sheets or blankets that are too big for your bed. They should not hang down onto the floor.  Have a firm chair that has side arms. You can use this for support while you get dressed.  Do not have throw rugs and other things on the floor that can make you trip. What can I do in the kitchen?  Clean up any spills right away.  Avoid  walking on wet floors.  Keep items that you use a lot in easy-to-reach places.  If you need to reach something above you, use a strong step stool that has a grab bar.  Keep electrical cords out of the way.  Do not use floor polish or wax that makes floors slippery. If you must use wax, use non-skid floor wax.  Do not have throw rugs and other things on the floor that can make you trip. What can I do with my stairs?  Do not leave any items on the stairs.  Make sure that there are handrails on both sides of the stairs and use them. Fix handrails that are broken or loose. Make sure that handrails are as long as the stairways.  Check any carpeting to make sure that it is firmly attached to the stairs. Fix any carpet that is loose or worn.  Avoid having throw rugs at the top or bottom of the stairs. If you do have throw rugs, attach them to the floor with carpet tape.  Make sure that you  have a light switch at the top of the stairs and the bottom of the stairs. If you do not have them, ask someone to add them for you. What else can I do to help prevent falls?  Wear shoes that:  Do not have high heels.  Have rubber bottoms.  Are comfortable and fit you well.  Are closed at the toe. Do not wear sandals.  If you use a stepladder:  Make sure that it is fully opened. Do not climb a closed stepladder.  Make sure that both sides of the stepladder are locked into place.  Ask someone to hold it for you, if possible.  Clearly mark and make sure that you can see:  Any grab bars or handrails.  First and last steps.  Where the edge of each step is.  Use tools that help you move around (mobility aids) if they are needed. These include:  Canes.  Walkers.  Scooters.  Crutches.  Turn on the lights when you go into a dark area. Replace any light bulbs as soon as they burn out.  Set up your furniture so you have a clear path. Avoid moving your furniture around.  If any of your floors are uneven, fix them.  If there are any pets around you, be aware of where they are.  Review your medicines with your doctor. Some medicines can make you feel dizzy. This can increase your chance of falling. Ask your doctor what other things that you can do to help prevent falls. This information is not intended to replace advice given to you by your health care provider. Make sure you discuss any questions you have with your health care provider. Document Released: 03/12/2009 Document Revised: 10/22/2015 Document Reviewed: 06/20/2014 Elsevier Interactive Patient Education  2017 [employer].

## 2019-04-16 NOTE — Progress Notes (Signed)
Subjective:   Kevin [name] is a 78 y.o. male who presents for Medicare Annual/Subsequent preventive examination.  Review of Systems:   Cardiac Risk Factors include: advanced age (>71men, >65 women);male gender;hypertension;sedentary lifestyle    Objective:    Vitals: BP 136/78 (BP Location: Left Arm, Patient Position: Sitting, Cuff Size: Normal)   Pulse 64   Temp (!) 97.4 F (36.3 C) (Temporal)   Ht 5\' 8"  (1.727 m)   Wt 188 lb (85.3 kg)   BMI 28.59 kg/m   Body mass index is 28.59 kg/m.  Advanced Directives 04/16/2019 01/07/2019 01/07/2019 06/19/2018 06/19/2018 02/01/2018 10/05/2017  Does Patient Have a Medical Advance Directive? Yes Yes No Yes Yes Yes Yes  Type of Advance Directive Living will;Healthcare Power of [place];Living will - [place];Living will [place];Living will [place];Living will -  Does patient want to make changes to medical advance directive? No - Patient declined No - Patient declined - No - Patient declined - - -  Copy of [place] in Chart? No - copy requested No - copy requested - No - copy requested No - copy requested Yes -  Would patient like information on creating a medical advance directive? - No - Patient declined No - Patient declined - - - -  Pre-existing out of facility DNR order (yellow form or pink MOST form) - - - - - - -    Tobacco Social History   Tobacco Use  Smoking Status Former Smoker  . Packs/day: 2.00  . Years: 28.00  . Pack years: 56.00  . Types: Cigarettes  . Quit date: 05/31/1987  . Years since quitting: 31.8  Smokeless Tobacco Never Used  Tobacco Comment   quit smoking "in my 73's"     Counseling given: Not Answered Comment: quit smoking "in my 67's"   Clinical Intake:  Pre-visit preparation completed: Yes   Diabetes: No  How often do you need to have someone help you when you read instructions, pamphlets, or  other written materials from your doctor or pharmacy?: 2 - Rarely  Interpreter Needed?: No  Comments: accompanied by spouse Information entered by :: Denman George LPN  Past Medical History:  Diagnosis Date  . Arthritis    DDD- Lower Back  . Bradycardia   . CAP (community acquired pneumonia) 03/17/2015   "THAT'S WHAT THEY ARE THINKING; THEY ARE NOT SURE"  . Carotid artery occlusion   . Chest pain 06/19/2018  . Chronic lower back pain    DDD  . CKD (chronic kidney disease)    Dr. Corliss Parish  . 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 Diverticulosis   . Enlarged prostate    takes Flomax daily  . History of blood transfusion    "probably when they did aortic aneurysm"  . History of colon polyps    benign  . Hyperlipidemia   . Hypertension    takes Amlodipine and Zebeta daily  . Insomnia    takes Melatonin nightly  . Joint pain   . 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     . Muscle spasm    takes Zanaflex daily as needed  . Peripheral edema    takes Furosemide daily  . Pneumonia  hx of   . Rheumatic fever 1946  . Rheumatoid arthritis ([place])   . Short-term memory loss    minimal  . Shortness of breath dyspnea    with exertion but lasts very short period of time  . TIA (transient ischemic attack)    x 2;takes Plavix daily  . Urinary frequency    takes Flomax daily  . Urinary urgency    Past Surgical History:  Procedure Laterality Date  . ABDOMINAL AORTIC ANEURYSM REPAIR  2010  . CAROTID ENDARTERECTOMY Left 09/20/2004  . CATARACT EXTRACTION, BILATERAL     2016-2017  . COLONOSCOPY    . 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 ENDARTERECTOMY Right 10/12/2015   Procedure: RIGHT CAROTID ENDARTERECTOMY WITH PATCH ANGIOPLASTY;  Surgeon: Elam Dutch, MD;  Location: [facility];  Service: Vascular;  Laterality: Right;  . INGUINAL HERNIA REPAIR Left 02/05/2013   Procedure: HERNIA REPAIR INGUINAL ADULT;  Surgeon: Joyice Faster. Cornett, MD;  Location: [facility];  Service: General;  Laterality: Left;  . INGUINAL HERNIA REPAIR Left   . INSERTION OF MESH Left 02/05/2013   Procedure: INSERTION OF MESH;  Surgeon: Joyice Faster. Cornett, MD;  Location: [facility];  Service: General;  Laterality: Left;  . LEFT HEART CATH AND CORONARY ANGIOGRAPHY N/A 06/21/2018   Procedure: LEFT HEART CATH AND CORONARY ANGIOGRAPHY;  Surgeon: Jettie Booze, MD;  Location: [facility];  Service: Cardiovascular;  Laterality: N/A;  . SHOULDER ARTHROSCOPY WITH ROTATOR CUFF REPAIR AND SUBACROMIAL DECOMPRESSION Left 05/01/2014   Procedure: LEFT ARTHROSCOPY SHOULDER SUBACROMIAL DECOMPRESSION,DISTAL CLAVICAL RESECTION AND ROTATOR CUFF REPAIR;  Surgeon: Marin Shutter, MD;  Location: [facility];  Service: Orthopedics;  Laterality: Left;  . TEE WITHOUT CARDIOVERSION N/A 06/27/2018   Procedure: TRANSESOPHAGEAL ECHOCARDIOGRAM (TEE);  Surgeon: Melrose Nakayama, MD;  Location: [facility];  Service: Open Heart Surgery;  Laterality: N/A;  . TESTICLAR CYST EXCISION Left   . TONSILLECTOMY    . VASECTOMY     Family History  Problem Relation Age of Onset  . Parkinsonism Father   . Dementia Father   . Cancer Father        Throat  . Colon cancer Mother        died in 43s  . Cancer Mother        Colon   Social History   Socioeconomic History  . Marital status: Married    Spouse name: Not on file  . Number of children: Not on file  . Years of education: Not on file  . Highest education level: Not on file   Occupational History  . Occupation: retired    Comment: maintenance; electrician  Social Needs  . Financial resource strain: Not on file  . Food insecurity    Worry: Not on file    Inability: Not on file  . Transportation needs    Medical: Not on file    Non-medical: Not on file  Tobacco Use  . Smoking status: Former Smoker    Packs/day: 2.00    Years: 28.00    Pack years: 56.00    Types: Cigarettes    Quit date: 05/31/1987    Years since quitting: 31.8  . Smokeless tobacco: Never Used  .  Tobacco comment: quit smoking "in my 74's"  Substance and Sexual Activity  . Alcohol use: Yes    Alcohol/week: 0.0 standard drinks    Comment: rare, mikes hard lemonade  . Drug use: No    Types: Marijuana    Comment: hx of-4-5 yrs ago  . Sexual activity: Not Currently  Lifestyle  . Physical activity    Days per week: Not on file    Minutes per session: Not on file  . Stress: Not on file  Relationships  . Social Herbalist on phone: Not on file    Gets together: Not on file    Attends religious service: Not on file    Active member of club or organization: Not on file    Attends meetings of clubs or organizations: Not on file    Relationship status: Not on file  Other Topics Concern  . Not on file  Social History Narrative   Married 33 years in 2015. Lived together for 12 years. 2 boys from first wife. 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       Hobbies: yardwork, [place] barn, woodwork, collect knive    Outpatient Encounter Medications as of 04/16/2019  Medication Sig  . albuterol (VENTOLIN HFA) 108 (90 Base) MCG/ACT inhaler Inhale 2 puffs into the lungs every 12 (twelve) hours as needed for wheezing or shortness of breath.  Marland Kitchen aspirin EC 81 MG EC tablet Take 1 tablet (81 mg total) by mouth daily.  Marland Kitchen atorvastatin (LIPITOR) 20 MG tablet TAKE 1 TABLET  EVERY DAY  . BIOTIN PO Take 1 tablet by mouth daily.  . Cholecalciferol (VITAMIN D-3) 1000 UNITS CAPS Take 1,000 Units by mouth daily.   . diazepam (VALIUM) 5 MG tablet TAKE 1/2 TO 1 TABLET q8 hours PRN pain sleep/muscle spasm-only if wife is around+can watch you closely when taking due to sedation risk (Patient taking differently: Take 2.5-5 mg by mouth See admin instructions. Take 2.5-5mg  every 8 hours as needed for pain sleep/muscle spasm-only if wife is around+can watch pt closely when taking due to sedation risk)  . fenofibrate 160 MG tablet TAKE 1 TABLET EVERY DAY (Patient taking differently: Take 160 mg by mouth daily. )  . furosemide (LASIX) 20 MG tablet Take 1 tablet (20 mg total) by mouth daily.  Marland Kitchen gabapentin (NEURONTIN) 400 MG capsule Take 1 capsule (400 mg total) by mouth 3 (three) times daily. 400 mg twice a day  . LINZESS 145 MCG CAPS capsule TAKE 1 CAPSULE BY MOUTH ONCE DAILY BEFORE BREAKFAST  . Melatonin 10 MG TABS Take 10 mg by mouth at bedtime.   . metoprolol tartrate (LOPRESSOR) 25 MG tablet Take 25 mg by mouth 2 (two) times daily.   Marland Kitchen oxyCODONE-acetaminophen (PERCOCET) 7.5-325 MG tablet Take 1 tablet by mouth every 8 (eight) hours as needed for severe pain.  . tamsulosin (FLOMAX) 0.4 MG CAPS capsule TAKE 1 CAPSULE EVERY DAY (Patient taking differently: Take 0.4 mg by mouth daily. )  . tiZANidine (ZANAFLEX) 4 MG tablet TAKE 1 TABLET EVERY 6 HOURS AS NEEDED FOR MUSCLE SPASM(S) (Patient taking differently: Take 4 mg by mouth at bedtime. )  . vitamin B-12 (CYANOCOBALAMIN) 1000 MCG tablet Take 1,000 mcg by mouth daily.  . benzonatate (TESSALON) 100 MG capsule Take 1 capsule (100 mg total) by mouth 2 (two)  times daily as needed for cough. (Patient not taking: Reported on 04/16/2019)   No facility-administered encounter medications on file as of 04/16/2019.     Activities of Daily Living In your present state of health, do you have any difficulty performing the following activities:  04/16/2019 01/07/2019  Hearing? Y N  Vision? N N  Difficulty concentrating or making decisions? Y N  Walking or climbing stairs? Y N  Dressing or bathing? N N  Doing errands, shopping? Y N  Preparing Food and eating ? N -  Using the Toilet? N -  In the past six months, have you accidently leaked urine? N -  Do you have problems with loss of bowel control? N -  Managing your Medications? N -  Managing your Finances? Y -  Comment handled by spouse -  Housekeeping or managing your Housekeeping? Y -  Comment handled by spouse -  Some recent data might be hidden    Patient Care Team: Marin Olp, MD as PCP - General (Family Medicine) Fay Records, MD as PCP - Cardiology (Cardiology) Lyndal Pulley, DO as Consulting Physician (Sports Medicine) Bayard Hugger, NP as Nurse Practitioner (Physical Medicine and Rehabilitation) Macarthur Critchley, [place] as Consulting Physician (Optometry)   Assessment:   This is a routine wellness examination for Mikolaj.  Exercise Activities and Dietary recommendations Current Exercise Habits: The patient does not participate in regular exercise at present, Exercise limited by: orthopedic condition(s)  Goals    . Patient Stated     To maintain health     . to develop      May discuss with MD regarding pool exercise or upper body exercise with wife.        Fall Risk Fall Risk  04/16/2019 04/16/2019 03/18/2019 10/09/2018 09/10/2018  Falls in the past year? 1 0 1 0 1  Comment - - - - last fall March 2020  Number falls in past yr: 1 0 - - 0  Comment - - - - -  Injury with Fall? 0 0 - - 1  Comment - - - - bruised ribs  Risk Factor Category  - - - - -  Risk for fall due to : Impaired balance/gait;Impaired mobility;Medication side effect - - - -  Follow up Education provided;Falls evaluation completed;Falls prevention discussed - - - -  Comment - - - - -   Is the patient's home free of loose throw rugs in walkways, pet beds, electrical cords, etc?    yes      Grab bars in the bathroom? yes      Handrails on the stairs?   yes      Adequate lighting?   yes  Timed Get Up and Go Performed: Patient with delayed transition from sitting to standing; requires standby assist with ambulation and utilizes cane   Depression Screen PHQ 2/9 Scores 04/16/2019 04/16/2019 08/09/2018 04/16/2018  PHQ - 2 Score 0 0 0 0    Cognitive Function- wife with concerns of forgetfulness; patient able to recall medications with no problem, may be more related to hearing difficulties as oppose to cognitive concerns    Alert? Yes         Normal Appearance? Yes  Oriented to person? Yes           Place? Yes  Time? Yes  Recall of three objects? Yes  Can perform simple calculations? Yes  Displays appropriate judgment? Yes  Can read the correct time from a watch  face? Yes    MMSE - Mini Mental State Exam 10/05/2017  Not completed: (No Data)        Immunization History  Administered Date(s) Administered  . Influenza Whole 05/30/1997  . Pneumococcal Conjugate-13 12/22/2014  . Pneumococcal Polysaccharide-23 03/28/2013  . Td 05/30/1994, 02/05/2007, 07/06/2017    Qualifies for Shingles Vaccine? Discussed and patient will check with pharmacy for coverage.  Patient education handout provided   Screening Tests Health Maintenance  Topic Date Due  . COLONOSCOPY  06/20/2018  . INFLUENZA VACCINE  08/28/2019 (Originally 12/29/2018)  . TETANUS/TDAP  07/07/2027  . PNA vac Low Risk Adult  Completed   Cancer Screenings: Lung: Low Dose CT Chest recommended if Age 70-80 years, 30 pack-year currently smoking OR have quit w/in 15years. Patient does not qualify. Colorectal: colonoscopy 06/20/13 with Dr. Deatra Ina; no longer indicated      Plan:  I have personally reviewed and addressed the Medicare Annual Wellness questionnaire and have noted the following in the patient's chart:  A. Medical and social history B. Use of alcohol, tobacco or illicit drugs  C. Current  medications and supplements D. Functional ability and status E.  Nutritional status F.  Physical activity G. Advance directives H. List of other physicians I.  Hospitalizations, surgeries, and ER visits in previous 12 months J.  [place] such as hearing and vision if needed, cognitive and depression L. Referrals, records requested, and appointments- none   In addition, I have reviewed and discussed with patient certain preventive protocols, quality metrics, and best practice recommendations. A written personalized care plan for preventive services as well as general preventive health recommendations were provided to patient.   Signed,  Denman George, LPN  Nurse Health Advisor   Nurse Notes: no additional

## 2019-04-16 NOTE — Progress Notes (Signed)
Phone [phone] In person visit   Subjective:   Kevin [name] is a 78 y.o. year old very pleasant male patient who presents for/with See problem oriented charting Chief Complaint  Patient presents with  . Follow-up    ROS-no fever/chills/nausea/vomiting/cough/congestion reported.  Notes a "rattle" in his chest  Past Medical History-  Patient Active Problem List   Diagnosis Date Noted  . Afib ([place]) 07/06/2018    Priority: High  . 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 PAD (peripheral artery disease) ([place]) 01/08/2016    Priority: High  . Diastolic CHF ([place]) 17/00/1749    Priority: High  . CKD (chronic kidney disease), stage IV ([place]) 01/27/2014    Priority: High  . Carotid artery stenosis s/p L carotid endarterectomy 01/12/2012    Priority: High  . History of CVA (cerebrovascular accident) 11/17/2011    Priority: High  . Chronic pain syndrome 12/23/2009    Priority: High  . Constipation 09/19/2017    Priority: Medium  . Personal history of colonic polyps 05/15/2013    Priority: Medium  . Essential tremor 10/05/2009    Priority: Medium  . UNSPECIFIED ANEMIA 12/05/2008    Priority: Medium  . BPH (benign prostatic hyperplasia) 06/04/2007    Priority: Medium  . Fasting hyperglycemia 02/05/2007    Priority: Medium  . Hyperlipidemia 01/03/2007    Priority: Medium  . Essential hypertension 01/03/2007    Priority: Medium  . Hemiparesis ([place]) 04/16/2019    Priority: Low  . CAP (community acquired pneumonia) 03/17/2015    Priority: Low  . Family history of malignant neoplasm of gastrointestinal tract 05/15/2013    Priority: Low  . Inguinal hernia 05/01/2012    Priority: Low  . BURSITIS, HIP 12/21/2009    Priority: Low  . ACTINIC KERATOSIS, EAR, LEFT 03/09/2009    Priority: Low  . 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 Greater trochanteric bursitis of left hip 02/26/2019  .  Degenerative arthritis of knee, bilateral 01/14/2019  . SIRS (systemic inflammatory response syndrome) ([place]) 01/08/2019  . Chest pain 01/08/2019  . Atelectasis of left lung 10/30/2018  . Lumbar spondylosis 03/17/2017    Medications- reviewed and updated Current Outpatient Medications  Medication Sig Dispense Refill  . albuterol (VENTOLIN HFA) 108 (90 Base) MCG/ACT inhaler Inhale 2 puffs into the lungs every 12 (twelve) hours as needed for wheezing or shortness of breath. 1 each 6  . aspirin EC 81 MG EC tablet Take 1 tablet (81 mg total) by mouth daily.    Marland Kitchen atorvastatin (LIPITOR) 20 MG tablet TAKE 1 TABLET EVERY DAY 90 tablet 1  . benzonatate (TESSALON) 100 MG capsule Take 1 capsule (100 mg total) by mouth 2 (two) times daily as needed for cough. (Patient not taking: Reported on 04/16/2019) 20 capsule 0  . BIOTIN PO Take 1 tablet by mouth daily.    . Cholecalciferol (VITAMIN D-3) 1000 UNITS CAPS Take 1,000 Units by mouth daily.     . diazepam (VALIUM) 5 MG tablet TAKE 1/2 TO 1 TABLET q8 hours PRN pain sleep/muscle spasm-only if wife is around+can watch you closely when taking due to sedation risk (Patient taking differently: Take 2.5-5 mg by mouth See admin instructions. Take 2.5-5mg  every 8 hours as needed for pain sleep/muscle spasm-only if wife is around+can watch pt closely when taking due to sedation risk) 90 tablet 0  . fenofibrate 160 MG  tablet TAKE 1 TABLET EVERY DAY (Patient taking differently: Take 160 mg by mouth daily. ) 90 tablet 3  . furosemide (LASIX) 20 MG tablet Take 1 tablet (20 mg total) by mouth daily. 90 tablet 1  . gabapentin (NEURONTIN) 400 MG capsule Take 1 capsule (400 mg total) by mouth 2 (two) times daily. 400 mg twice a day 180 capsule 3  . linaclotide (LINZESS) 145 MCG CAPS capsule TAKE 1 CAPSULE BY MOUTH ONCE DAILY BEFORE BREAKFAST 90 capsule 3  . Melatonin 10 MG TABS Take 10 mg by mouth at bedtime.     . metoprolol tartrate (LOPRESSOR) 25 MG tablet Take 1 tablet  (25 mg total) by mouth 2 (two) times daily. 180 tablet 3  . oxyCODONE-acetaminophen (PERCOCET) 7.5-325 MG tablet Take 1 tablet by mouth every 8 (eight) hours as needed for severe pain. 70 tablet 0  . tamsulosin (FLOMAX) 0.4 MG CAPS capsule TAKE 1 CAPSULE EVERY DAY (Patient taking differently: Take 0.4 mg by mouth daily. ) 90 capsule 3  . tiZANidine (ZANAFLEX) 4 MG tablet TAKE 1 TABLET EVERY 6 HOURS AS NEEDED FOR MUSCLE SPASM(S) (Patient taking differently: Take 4 mg by mouth at bedtime. ) 90 tablet 1  . vitamin B-12 (CYANOCOBALAMIN) 1000 MCG tablet Take 1,000 mcg by mouth daily.     No current facility-administered medications for this visit.      Objective:  BP 136/78 (BP Location: Left Arm, Patient Position: Sitting, Cuff Size: Normal)   Pulse 64   Temp (!) 97.4 F (36.3 C) (Temporal)   Ht 5\' 8"  (1.727 m)   Wt 188 lb (85.3 kg)   SpO2 95%   BMI 28.59 kg/m  Gen: NAD, resting comfortably CV: RRR no murmurs rubs or gallops Lungs: CTAB no crackles, wheeze, rhonchi Abdomen: soft/nontender/nondistended/normal bowel sounds. Ext: no edema Skin: warm, dry    Assessment and Plan    #Community-acquired pneumonia follow-up  #Fatigue S: Patient was hospitalized in August for pneumonia but never followed up.  He is nervous he may be developing pneumonia again.hearing a crackling sensation in lungs or throat intermittently. Worries him due to pneumonia in august- to patient has been a sign in past of pgrogressive issues.   Feels fatigued and run down in general. Feels like overall pain from back and arthritis really wears on him. Known hemiparesis on left from prior stroke- states stable.   Patient has been having some cramps in his legs.  Feels like diazepam used to be helpful for this but has not been helpful recently and symptoms are worsening A/P: Community-acquired pneumonia follow-up.  X-ray today with possible atelectasis and left lower lung but appears to be improving-I will offer the  option of using doxycycline for 7 days and repeating x-ray in 4 to 6 weeks though I also suspect atelectasis could be the cause-also advised consideration of incentive spirometer.  For leg cramps-we will check electrolytes and magnesium today.  Discussed mustard may be beneficial but also the salt content may not be ideal with his CHF and kidney disease  #Social update-may need letter for spouse as he is high risk to work from home.  I am more than happy to help with this  #Memory concern S: After visit reviewed wellness visit and wife complained at that time of concern of memory loss-does not appear cognitive testing was completed A/P: I will have Loma Sousa our wellness nurse reach out to patient to see if we can complete cognitive testing by phone-if not encouraged follow-up visit within  the next month to complete MMSE  #Anemia-updated ferritin today which fortunately was in normal range.  CBC/hemoglobin appear to be improving.  #Hemiparesis-left-sided weakness after prior stroke noted.  #Low back pain-patient with chronic pain followed by [facility] on oxycodone.  Patient also finds gabapentin helpful.  We discussed today with his kidney disease we have to monitor-since most recent GFR is above 30 continues up to 900 mg/day-currently on 400 mg twice a day.  He requested a refill to his mail order pharmacy.  This was provided. -On the other hand in light of memory concerns may need to reduce any medicine which can be sedating such as gabapentin  Recommended follow up: As needed for above concerns-at the latest would recommend 52-month follow-up Future Appointments  Date Time Provider [place]  04/18/2019  9:00 AM Bayard Hugger, NP [facility] CPR   Lab/Order associations:   ICD-10-CM   1. Community acquired pneumonia of left lower lobe of lung  J18.9 DG Chest [facility]    DG Chest [facility]  2. Fatigue, unspecified type  R53.83 CBC with Differential    Comprehensive metabolic panel    TSH  3.  Anemia, unspecified type  D64.9 CBC with Differential    Ferritin panel. new 06/2018    Fecal occult blood, imunochemical    Fecal occult blood, imunochemical    CANCELED: Stool cards as of 10/27/17  4. Muscle cramps  R25.2 Magnesium  5. Hemiparesis of left dominant side as late effect of other cerebrovascular disease ([place]) Chronic [phone]     Meds ordered this encounter  Medications  . linaclotide (LINZESS) 145 MCG CAPS capsule    Sig: TAKE 1 CAPSULE BY MOUTH ONCE DAILY BEFORE BREAKFAST    Dispense:  90 capsule    Refill:  3  . metoprolol tartrate (LOPRESSOR) 25 MG tablet    Sig: Take 1 tablet (25 mg total) by mouth 2 (two) times daily.    Dispense:  180 tablet    Refill:  3  . gabapentin (NEURONTIN) 400 MG capsule    Sig: Take 1 capsule (400 mg total) by mouth 2 (two) times daily. 400 mg twice a day    Dispense:  180 capsule    Refill:  3   Time Stamp The duration of face-to-face time during this visit was greater than 25 minutes. Greater than 50% of this time was spent in counseling, explanation of diagnosis, planning of further management, and/or coordination of care including discussing risks of gabapentin related to GFR, discussing frustration of ongoing fatigue and pain issues, discussing appropriate work-up for concerns.   Return precautions advised.  Garret Reddish, MD

## 2019-04-17 ENCOUNTER — Other Ambulatory Visit: Payer: Self-pay

## 2019-04-17 MED ORDER — DOXYCYCLINE HYCLATE 100 MG PO TABS: 100.0000 mg | ORAL_TABLET | Freq: Two times a day (BID) | ORAL | 0 refills | Status: DC

## 2019-04-18 ENCOUNTER — Encounter: Payer: Medicare HMO | Attending: Physical Medicine & Rehabilitation | Admitting: Registered Nurse

## 2019-04-18 ENCOUNTER — Encounter: Payer: Self-pay | Admitting: Registered Nurse

## 2019-04-18 ENCOUNTER — Other Ambulatory Visit: Payer: Self-pay

## 2019-04-18 VITALS — BP 138/80 | HR 60 | Temp 97.3°F | Ht 68.0 in | Wt 185.0 lb

## 2019-04-18 DIAGNOSIS — M1712 Unilateral primary osteoarthritis, left knee: Secondary | ICD-10-CM | POA: Diagnosis not present

## 2019-04-18 DIAGNOSIS — M47816 Spondylosis without myelopathy or radiculopathy, lumbar region: Secondary | ICD-10-CM | POA: Diagnosis not present

## 2019-04-18 DIAGNOSIS — Z79891 Long term (current) use of opiate analgesic: Secondary | ICD-10-CM

## 2019-04-18 DIAGNOSIS — W19XXXA Unspecified fall, initial encounter: Secondary | ICD-10-CM | POA: Diagnosis not present

## 2019-04-18 DIAGNOSIS — M25551 Pain in right hip: Secondary | ICD-10-CM

## 2019-04-18 DIAGNOSIS — G8929 Other chronic pain: Secondary | ICD-10-CM

## 2019-04-18 DIAGNOSIS — M170 Bilateral primary osteoarthritis of knee: Secondary | ICD-10-CM

## 2019-04-18 DIAGNOSIS — Z5181 Encounter for therapeutic drug level monitoring: Secondary | ICD-10-CM

## 2019-04-18 DIAGNOSIS — I739 Peripheral vascular disease, unspecified: Secondary | ICD-10-CM | POA: Insufficient documentation

## 2019-04-18 DIAGNOSIS — G894 Chronic pain syndrome: Secondary | ICD-10-CM | POA: Diagnosis not present

## 2019-04-18 DIAGNOSIS — M25552 Pain in left hip: Secondary | ICD-10-CM

## 2019-04-18 DIAGNOSIS — M1711 Unilateral primary osteoarthritis, right knee: Secondary | ICD-10-CM

## 2019-04-18 MED ORDER — OXYCODONE-ACETAMINOPHEN 7.5-325 MG PO TABS: 1.0000 | ORAL_TABLET | Freq: Three times a day (TID) | ORAL | 0 refills | Status: DC | PRN

## 2019-04-18 NOTE — Progress Notes (Signed)
Subjective:    Patient ID: Kevin [name], male    DOB: [birth year], 78 y.o.   MRN: [id number]  HPI: Kevin [name] is a 78 y.o. male who returns for follow up appointment for chronic pain and medication refill. He states his pain is located in his lower back, bilateral hips and bilateral knees. He rates his  Pain 5. His current exercise regime is walking and performing outside chores.   Mr. Dunlap reports last week he was out on his deck, he was walking and stumbled over his feet and landed on his left side. His son helped him up. He didn't have his cane with him, he didn't seek medical attention. He was educated on falls prevention and for him to use his cane at all times, he verbalizes understanding.   Mr. Kenley forgot his medication reviewed the narcotic policy he verbalizes understanding.   [name] Morphine equivalent is 32.81  MME. He is also prescribed Diazepam  by [facility] We have discussed the black box warning of using opioids and benzodiazepines. I highlighted the dangers of using these drugs together and discussed the adverse events including respiratory suppression, overdose, cognitive impairment and importance of compliance with current regimen. We will continue to monitor and adjust as indicated.   Last Oral Swab was Performed on 03/27/2019, it was consistent.   Pain Inventory Average Pain 7 Pain Right Now 5 My pain is aching  In the last 24 hours, has pain interfered with the following? General activity 5 Relation with others 5 Enjoyment of life 5 What TIME of day is your pain at its worst? night Sleep (in general) Poor  Pain is worse with: walking, bending and standing Pain improves with: rest and medication Relief from Meds: 5  Mobility use a cane how many minutes can you walk? 15 ability to climb steps?  yes do you drive?  yes  Function retired I need assistance with the following:  feeding  Neuro/Psych weakness spasms dizziness  Prior Studies  x-rays  Physicians involved in your care Any changes since last visit?  yes Primary care .   Family History  Problem Relation Age of Onset  . Parkinsonism Father   . Dementia Father   . Cancer Father        Throat  . Colon cancer Mother        died in 91s  . Cancer Mother        Colon   Social History   Socioeconomic History  . Marital status: Married    Spouse name: Not on file  . Number of children: Not on file  . Years of education: Not on file  . Highest education level: Not on file  Occupational History  . Occupation: retired    Comment: maintenance; electrician  Social Needs  . Financial resource strain: Not on file  . Food insecurity    Worry: Not on file    Inability: Not on file  . Transportation needs    Medical: Not on file    Non-medical: Not on file  Tobacco Use  . Smoking status: Former Smoker    Packs/day: 2.00    Years: 28.00    Pack years: 56.00    Types: Cigarettes    Quit date: 05/31/1987    Years since quitting: 31.9  . Smokeless tobacco: Never Used  . Tobacco comment: quit smoking "in my 9's"  Substance and Sexual Activity  . Alcohol use: Yes    Alcohol/week:  0.0 standard drinks    Comment: rare, mikes hard lemonade  . Drug use: No    Types: Marijuana    Comment: hx of-4-5 yrs ago  . Sexual activity: Not Currently  Lifestyle  . Physical activity    Days per week: Not on file    Minutes per session: Not on file  . Stress: Not on file  Relationships  . Social Herbalist on phone: Not on file    Gets together: Not on file    Attends religious service: Not on file    Active member of club or organization: Not on file    Attends meetings of clubs or organizations: Not on file    Relationship status: Not on file  Other Topics Concern  . Not on file  Social History Narrative   Married 33 years in 2015. Lived together for 12 years. 2 boys from first wife. 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       Hobbies: yardwork, [place] barn, woodwork, Orthoptist   Past Surgical History:  Procedure Laterality Date  . ABDOMINAL AORTIC ANEURYSM REPAIR  2010  . CAROTID ENDARTERECTOMY Left 09/20/2004  . CATARACT EXTRACTION, BILATERAL     2016-2017  . COLONOSCOPY    . CORONARY ARTERY BYPASS GRAFT N/A 06/27/2018   Procedure: CORONARY ARTERY BYPASS GRAFTING (CABG), ON PUMP, TIMES FOUR, USING LEFT INTERNAL MAMMARY ARTERY AND ENDOSCOPICALLY HARVESTED LEFT GREATER SAPHENOUS VEIN;  Surgeon: Melrose Nakayama, MD;  Location: [facility];  Service: Open Heart Surgery;  Laterality: N/A;  . ENDARTERECTOMY Right 10/12/2015   Procedure: RIGHT CAROTID ENDARTERECTOMY WITH PATCH ANGIOPLASTY;  Surgeon: Elam Dutch, MD;  Location: [facility];  Service: Vascular;  Laterality: Right;  . INGUINAL HERNIA REPAIR Left 02/05/2013   Procedure: HERNIA REPAIR INGUINAL ADULT;  Surgeon: Joyice Faster. Cornett, MD;  Location: [facility];  Service: General;  Laterality: Left;  . INGUINAL HERNIA REPAIR Left   . INSERTION OF MESH Left 02/05/2013   Procedure: INSERTION OF MESH;  Surgeon: Joyice Faster. Cornett, MD;  Location: [facility];  Service: General;  Laterality: Left;  . LEFT HEART CATH AND CORONARY ANGIOGRAPHY N/A 06/21/2018   Procedure: LEFT HEART CATH AND CORONARY ANGIOGRAPHY;  Surgeon: Jettie Booze, MD;  Location: [facility];  Service: Cardiovascular;  Laterality: N/A;  . SHOULDER ARTHROSCOPY WITH ROTATOR CUFF REPAIR AND SUBACROMIAL DECOMPRESSION Left 05/01/2014   Procedure: LEFT ARTHROSCOPY SHOULDER SUBACROMIAL DECOMPRESSION,DISTAL CLAVICAL RESECTION AND ROTATOR CUFF REPAIR;  Surgeon: Marin Shutter, MD;  Location: [facility];  Service: Orthopedics;  Laterality: Left;  . TEE WITHOUT CARDIOVERSION N/A 06/27/2018   Procedure: TRANSESOPHAGEAL ECHOCARDIOGRAM (TEE);  Surgeon: Melrose Nakayama, MD;   Location: [facility];  Service: Open Heart Surgery;  Laterality: N/A;  . TESTICLAR CYST EXCISION Left   . TONSILLECTOMY    . VASECTOMY     Past Medical History:  Diagnosis Date  . Arthritis    DDD- Lower Back  . Bradycardia   . CAP (community acquired pneumonia) 03/17/2015   "THAT'S WHAT THEY ARE THINKING; THEY ARE NOT SURE"  . Carotid artery occlusion   . Chest pain 06/19/2018  . Chronic lower back pain    DDD  . CKD (chronic kidney disease)    Dr. Corliss Parish  . 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 Diverticulosis   . Enlarged prostate    takes Flomax daily  . History of blood transfusion    "probably when they did aortic aneurysm"  . History of colon polyps    benign  . Hyperlipidemia   . Hypertension    takes Amlodipine and Zebeta daily  . Insomnia    takes Melatonin nightly  . Joint pain   . 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     . Muscle spasm    takes Zanaflex daily as needed  . Peripheral edema    takes Furosemide daily  . Pneumonia    hx of   . Rheumatic fever 1946  . Rheumatoid arthritis ([place])   . Short-term memory loss    minimal  . Shortness of breath dyspnea    with exertion but lasts very short period of time  . TIA (transient ischemic attack)    x 2;takes Plavix daily  . Urinary frequency    takes Flomax daily  . Urinary urgency    BP 138/80   Pulse (!) 59   Temp (!) 97.3 F (36.3 C)   Ht 5\' 8"  (1.727 m)   Wt 185 lb (83.9 kg)   SpO2 95%   BMI 28.13 kg/m   Opioid Risk Score:   Fall Risk Score:  `1  Depression screen PHQ 2/9  Depression screen [facility] 2/9 04/16/2019 04/16/2019 08/09/2018 04/16/2018 01/22/2018 10/19/2017 10/05/2017  Decreased Interest 0 0 0 0 0 0 0  Down, Depressed, Hopeless 0 0 0 0 0 0 0  PHQ - 2 Score 0 0 0 0 0 0 0  Some recent  data might be hidden     Review of Systems  Constitutional: Positive for diaphoresis.  Musculoskeletal:       Spasms  Neurological: Positive for dizziness and weakness.       Objective:   Physical Exam Vitals signs and nursing note reviewed.  Constitutional:      Appearance: Normal appearance.  Neck:     Musculoskeletal: Normal range of motion and neck supple.  Cardiovascular:     Rate and Rhythm: Normal rate and regular rhythm.     Pulses: Normal pulses.     Heart sounds: Normal heart sounds.  Pulmonary:     Effort: Pulmonary effort is normal.     Breath sounds: Normal breath sounds.  Musculoskeletal:     Comments: Normal Muscle Bulk and Muscle Testing Reveals:  Upper Extremities: Full ROM and Muscle Strength 5/5  Lumbar Paraspinal Tenderness Lower Extremities: Full ROM and Muscle Strength 5/5 Arises from chair slowly using cane for support Antalgic  Gait   Skin:    General: Skin is warm and dry.  Neurological:     Mental Status: He is alert and oriented to person, place, and time.  Psychiatric:        Mood and Affect: Mood normal.        Behavior: Behavior normal.           Assessment & Plan:  1. Lumbar Spondylosis/ Lumbar Stenosis:04/18/2019 Continue current medication regime. Refilled:Oxycodone 7.5/325 mg one tabletevery 8 hoursas needed #70.03/18/2019 We will continue the opioid monitoring program, this consists of regular clinic visits, examinations, urine drug screen, pill counts as well as use of [place] Controlled  Substance Reporting System. 2. Bilateral Knees OA/ChronicBilateralKnee Pain:S/PBilateral KneeInjection by Dr Paulla Fore on 02/26/2019, Orthopedist Following Dr. Paulla Fore.04/18/2019 3. Bilateral  Hip Pain: Orthopedist Following.No complaints today.04/18/2019 4. Bradycardia: PCP Following he reports. Apical Pulse was checked today.   51minutes of face to face patient care time was spent during this visit. All questions were  encouraged and answered.  F/U in 1 month

## 2019-04-23 ENCOUNTER — Other Ambulatory Visit: Payer: Self-pay | Admitting: Family Medicine

## 2019-04-23 ENCOUNTER — Other Ambulatory Visit: Payer: Self-pay

## 2019-04-23 MED ORDER — DIAZEPAM 5 MG PO TABS: ORAL_TABLET | ORAL | 0 refills | Status: DC

## 2019-04-23 NOTE — Telephone Encounter (Signed)
Medication refill: diazepam (VALIUM) 5 MG tablet [[id number]]    Would like 90 day supply   Pharmacy: [facility], [place] [phone] (Phone) [phone] (Fax)    Pt aware of turn around time.

## 2019-04-23 NOTE — Telephone Encounter (Signed)
Rx fwd to [facility] for approval.

## 2019-04-23 NOTE — Telephone Encounter (Signed)
See below

## 2019-04-23 NOTE — Telephone Encounter (Signed)
Requested medication (s) are due for refill today: yes  Requested medication (s) are on the active medication list: yes  Last refill:  01/03/2019  Future visit scheduled: no  Notes to clinic:  Refill cannot be delegated  Requesting 90 day supply  Requested Prescriptions  Pending Prescriptions Disp Refills   diazepam (VALIUM) 5 MG tablet 90 tablet 0    Sig: TAKE 1/2 TO 1 TABLET q8 hours PRN pain sleep/muscle spasm-only if wife is around+can watch you closely when taking due to sedation risk     Not Delegated - Psychiatry:  Anxiolytics/Hypnotics Failed - 04/23/2019 11:20 AM      Failed - This refill cannot be delegated      Failed - Urine Drug Screen completed in last 360 days.      Passed - Valid encounter within last 6 months    Recent Outpatient Visits          1 week ago Community acquired pneumonia of left lower lobe of lung   [facility] Regulus, Brayton Mars, MD   1 month ago Left hip pain   [place], DO   3 months ago Primary osteoarthritis of both knees   [place], [place], DO   4 months ago Lumbar spondylosis   [place], [place], DO   4 months ago Cough   [place] [facility], Langley Adie, MD

## 2019-04-23 NOTE — Progress Notes (Signed)
Routing to team.

## 2019-04-23 NOTE — Progress Notes (Unsigned)
Refill provided for diazepam-has had extensive counseling through pain management-he should avoid taking diazepam within at least 8 hours of taking his pain medicine

## 2019-04-24 NOTE — Progress Notes (Signed)
Called reviewed information and had patient repeat back to me. Will call if any questions.

## 2019-05-08 ENCOUNTER — Ambulatory Visit: Payer: Medicare HMO | Admitting: Family Medicine

## 2019-05-09 ENCOUNTER — Encounter: Payer: Self-pay | Admitting: Family Medicine

## 2019-05-09 ENCOUNTER — Ambulatory Visit (INDEPENDENT_AMBULATORY_CARE_PROVIDER_SITE_OTHER): Payer: Medicare HMO | Admitting: Family Medicine

## 2019-05-09 VITALS — BP 127/63 | HR 90 | Temp 97.3°F | Wt 184.7 lb

## 2019-05-09 DIAGNOSIS — J029 Acute pharyngitis, unspecified: Secondary | ICD-10-CM

## 2019-05-09 DIAGNOSIS — Z8701 Personal history of pneumonia (recurrent): Secondary | ICD-10-CM | POA: Diagnosis not present

## 2019-05-09 DIAGNOSIS — R05 Cough: Secondary | ICD-10-CM | POA: Diagnosis not present

## 2019-05-09 DIAGNOSIS — R059 Cough, unspecified: Secondary | ICD-10-CM

## 2019-05-09 NOTE — Progress Notes (Signed)
Phone [phone] Virtual visit via phonenote   Subjective:   Chief Complaint  Patient presents with  . Cough   This visit type was conducted due to national recommendations for restrictions regarding the COVID-19 Pandemic (e.g. social distancing).  This format is felt to be most appropriate for this patient at this time balancing risks to patient and risks to population by having him in for in person visit.  All issues noted in this document were discussed and addressed.  No physical exam was performed (except for noted visual exam or audio findings with Telehealth visits).  The patient has consented to conduct a Telehealth visit and understands insurance will be billed.   Our team/I connected with Larena Sox at  1:20 PM EST by phone (patient did not have equipment for webex) and verified that I am speaking with the correct person using two identifiers.  Location patient: [facility] Location provider: [facility], office Persons participating in the virtual visit:  Patient, wife  Time on phone: 21 minutes Counseling provided about covid 19 and follow up indications  Our team/I discussed the limitations of evaluation and management by telemedicine and the availability of in person appointments. In light of current covid-19 pandemic, patient also understands that we are trying to protect them by minimizing in office contact if at all possible.  The patient expressed consent for telemedicine visit and agreed to proceed. Patient understands insurance will be billed.   ROS- No chills, shortness of breath, fatigue, body aches, headache, nausea, vomiting,  or new loss of taste or smell. No known contacts with covid 19 or someone being tested for covid 19.   Past Medical History-  Patient Active Problem List   Diagnosis Date Noted  . Afib ([place]) 07/06/2018    Priority: High  . 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 PAD (peripheral artery disease) ([place]) 01/08/2016    Priority: High  .  Diastolic CHF ([place]) 16/60/6301    Priority: High  . CKD (chronic kidney disease), stage IV ([place]) 01/27/2014    Priority: High  . Carotid artery stenosis s/p L carotid endarterectomy 01/12/2012    Priority: High  . History of CVA (cerebrovascular accident) 11/17/2011    Priority: High  . Chronic pain syndrome 12/23/2009    Priority: High  . Constipation 09/19/2017    Priority: Medium  . Personal history of colonic polyps 05/15/2013    Priority: Medium  . Essential tremor 10/05/2009    Priority: Medium  . UNSPECIFIED ANEMIA 12/05/2008    Priority: Medium  . BPH (benign prostatic hyperplasia) 06/04/2007    Priority: Medium  . Fasting hyperglycemia 02/05/2007    Priority: Medium  . Hyperlipidemia 01/03/2007    Priority: Medium  . Essential hypertension 01/03/2007    Priority: Medium  . Hemiparesis ([place]) 04/16/2019    Priority: Low  . CAP (community acquired pneumonia) 03/17/2015    Priority: Low  . Family history of malignant neoplasm of gastrointestinal tract 05/15/2013    Priority: Low  . Inguinal hernia 05/01/2012    Priority: Low  . BURSITIS, HIP 12/21/2009    Priority: Low  . ACTINIC KERATOSIS, EAR, LEFT 03/09/2009    Priority: Low  . 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 Greater trochanteric bursitis of left hip 02/26/2019  . Degenerative arthritis of knee, bilateral 01/14/2019  . SIRS (systemic inflammatory response syndrome) ([place]) 01/08/2019  .  Chest pain 01/08/2019  . Atelectasis of left lung 10/30/2018  . Lumbar spondylosis 03/17/2017    Medications- reviewed and updated Current Outpatient Medications  Medication Sig Dispense Refill  . albuterol (VENTOLIN HFA) 108 (90 Base) MCG/ACT inhaler Inhale 2 puffs into the lungs every 12 (twelve) hours as needed for wheezing or shortness of breath. 1 each 6  . aspirin EC 81 MG EC tablet Take 1 tablet (81 mg total) by mouth daily.    Marland Kitchen  atorvastatin (LIPITOR) 20 MG tablet TAKE 1 TABLET EVERY DAY 90 tablet 1  . benzonatate (TESSALON) 100 MG capsule Take 1 capsule (100 mg total) by mouth 2 (two) times daily as needed for cough. 20 capsule 0  . BIOTIN PO Take 1 tablet by mouth daily.    . Cholecalciferol (VITAMIN D-3) 1000 UNITS CAPS Take 1,000 Units by mouth daily.     . diazepam (VALIUM) 5 MG tablet TAKE 1/2 TO 1 TABLET q8 hours PRN pain sleep/muscle spasm-only if wife is around+can watch you closely when taking due to sedation risk 90 tablet 0  . doxycycline (VIBRA-TABS) 100 MG tablet Take 1 tablet (100 mg total) by mouth 2 (two) times daily. 14 tablet 0  . fenofibrate 160 MG tablet TAKE 1 TABLET EVERY DAY (Patient taking differently: Take 160 mg by mouth daily. ) 90 tablet 3  . furosemide (LASIX) 20 MG tablet Take 1 tablet (20 mg total) by mouth daily. 90 tablet 1  . gabapentin (NEURONTIN) 400 MG capsule Take 1 capsule (400 mg total) by mouth 2 (two) times daily. 400 mg twice a day 180 capsule 3  . linaclotide (LINZESS) 145 MCG CAPS capsule TAKE 1 CAPSULE BY MOUTH ONCE DAILY BEFORE BREAKFAST 90 capsule 3  . Melatonin 10 MG TABS Take 10 mg by mouth at bedtime.     . metoprolol tartrate (LOPRESSOR) 25 MG tablet Take 1 tablet (25 mg total) by mouth 2 (two) times daily. 180 tablet 3  . oxyCODONE-acetaminophen (PERCOCET) 7.5-325 MG tablet Take 1 tablet by mouth every 8 (eight) hours as needed for severe pain. 70 tablet 0  . tamsulosin (FLOMAX) 0.4 MG CAPS capsule TAKE 1 CAPSULE EVERY DAY (Patient taking differently: Take 0.4 mg by mouth daily. ) 90 capsule 3  . tiZANidine (ZANAFLEX) 4 MG tablet TAKE 1 TABLET EVERY 6 HOURS AS NEEDED FOR MUSCLE SPASM(S) (Patient taking differently: Take 4 mg by mouth at bedtime. ) 90 tablet 1  . vitamin B-12 (CYANOCOBALAMIN) 1000 MCG tablet Take 1,000 mcg by mouth daily.     No current facility-administered medications for this visit.     Objective:  BP 127/63   Pulse 90   Temp (!) 97.3 F (36.3  C)   Wt 184 lb 11.2 oz (83.8 kg)   BMI 28.08 kg/m  self reported vitals  Nonlabored voice, normal speech      Assessment and Plan   # cough/sore throat S: got feet wet outside last Thursday or Friday. By Saturday had slight sore throat as well as cough. Sore throat worsened over next few days and felt hard to swallow- now improved. Notes a crackle/rattle in lungs and cough at times. Today symptoms are finally improving. Coughing up clear sputum. Patient has some sinus congestion as well. Possible elevated temperature on Sunday. Occasional loose stool but not new. Runny nose but has been chronic issues.   Patient was treated for possible pneumonia for sensation of crackling in lugns 04/16/2019 with doxycycline. Symptoms improved/resolved for over a week. This  appears to be a new set of symptoms.   Sore throat is moderate. Does not feel severe like prior strep throat encounters.  A/P: Patient with symptoms concerning for potential covid 19 Therefore: - testing ordered for covid 19 and information provided on drive up testing  - recommended patient watch closely for shortness of breath or confusion or worsening symptoms and if those occur he should contact us immediately  -recommended patient consider purchasing pulse oximeter and if levels 94% or below persistently- seek care at the hospital -recommended self isolation until negative test  at minimum - for quarantine if covid 19 test positive would need to be at least 10 days since first symptom AND at least 24 hours fever free without fever reducing medications AND improvement in respiratory symptoms  -Hopeful results back within 48 hours of test but may take up to a week  - work note  Not needed - we discussed with prior pneumonia if covid 19 test is negative he will come by for chest x-ray next week- will need to schedule visit -wife also plans to be tested at testing site.   -Wife would like for patient to have the flu shot if test  comes back negative and if symptoms improve.  Patient is still concerned he may get sick from the flu shot like he has in the past but agrees to at least consider -Also discussed with patient with multiple mask medical conditions would prefer for him to get COVID-19 vaccination-he agrees when available  Recommended follow up: as needed for new or worsening symptoms Future Appointments  Date Time Provider [place]  05/15/2019  9:20 AM Bayard Hugger, NP [facility]   Lab/Order associations:   ICD-10-CM   1. Sore throat  J02.9 Novel Coronavirus, NAA ([facility])  2. Cough  R05 [facility]  3. History of pneumonia  Z87.01    Return precautions advised.  Garret Reddish, MD

## 2019-05-09 NOTE — Patient Instructions (Signed)
Health Maintenance Due  Topic Date Due  . COLONOSCOPY  06/20/2018   Depression screen [facility] 2/9 04/16/2019 04/16/2019 08/09/2018  Decreased Interest 0 0 0  Down, Depressed, Hopeless 0 0 0  PHQ - 2 Score 0 0 0  Some recent data might be hidden

## 2019-05-10 ENCOUNTER — Other Ambulatory Visit: Payer: Self-pay

## 2019-05-10 DIAGNOSIS — Z20822 Contact with and (suspected) exposure to covid-19: Secondary | ICD-10-CM

## 2019-05-12 LAB — NOVEL CORONAVIRUS, NAA: SARS-CoV-2, NAA: NOT DETECTED

## 2019-05-15 ENCOUNTER — Encounter: Payer: Self-pay | Admitting: Registered Nurse

## 2019-05-15 ENCOUNTER — Other Ambulatory Visit: Payer: Self-pay

## 2019-05-15 ENCOUNTER — Encounter: Payer: Medicare HMO | Attending: Physical Medicine & Rehabilitation | Admitting: Registered Nurse

## 2019-05-15 VITALS — BP 150/78 | HR 60 | Temp 97.8°F | Ht 67.5 in | Wt 188.0 lb

## 2019-05-15 DIAGNOSIS — M1712 Unilateral primary osteoarthritis, left knee: Secondary | ICD-10-CM | POA: Diagnosis not present

## 2019-05-15 DIAGNOSIS — Z5181 Encounter for therapeutic drug level monitoring: Secondary | ICD-10-CM | POA: Diagnosis not present

## 2019-05-15 DIAGNOSIS — M1711 Unilateral primary osteoarthritis, right knee: Secondary | ICD-10-CM | POA: Diagnosis not present

## 2019-05-15 DIAGNOSIS — M47816 Spondylosis without myelopathy or radiculopathy, lumbar region: Secondary | ICD-10-CM | POA: Diagnosis not present

## 2019-05-15 DIAGNOSIS — Z79891 Long term (current) use of opiate analgesic: Secondary | ICD-10-CM | POA: Diagnosis not present

## 2019-05-15 DIAGNOSIS — I739 Peripheral vascular disease, unspecified: Secondary | ICD-10-CM | POA: Diagnosis not present

## 2019-05-15 DIAGNOSIS — M7062 Trochanteric bursitis, left hip: Secondary | ICD-10-CM | POA: Diagnosis not present

## 2019-05-15 DIAGNOSIS — G894 Chronic pain syndrome: Secondary | ICD-10-CM

## 2019-05-15 DIAGNOSIS — M7061 Trochanteric bursitis, right hip: Secondary | ICD-10-CM | POA: Diagnosis not present

## 2019-05-15 DIAGNOSIS — M25551 Pain in right hip: Secondary | ICD-10-CM | POA: Insufficient documentation

## 2019-05-15 MED ORDER — OXYCODONE-ACETAMINOPHEN 7.5-325 MG PO TABS: 1.0000 | ORAL_TABLET | Freq: Three times a day (TID) | ORAL | 0 refills | Status: DC | PRN

## 2019-05-15 NOTE — Progress Notes (Signed)
Subjective:    Patient ID: Kevin [name], male    DOB: [birth year], 78 y.o.   MRN: [id number]  HPI: Kevin [name] is a 78 y.o. male who returns for follow up appointment for chronic pain and medication refill. He states his pain is located in his lower back, bilateral hips and bilateral knees R>L. He rates his pain 4. His current exercise regime is walking.  Mr. Linck Morphine equivalent is 32.81  MME. He is also prescribed Diazepam  by Dr. [name]. We have discussed the black box warning of using opioids and benzodiazepines. I highlighted the dangers of using these drugs together and discussed the adverse events including respiratory suppression, overdose, cognitive impairment and importance of compliance with current regimen. We will continue to monitor and adjust as indicated.   Last Oral Swab was Performed on 03/18/2019, it was consistent.   Pain Inventory Average Pain 7 Pain Right Now 4 My pain is stabbing and aching  In the last 24 hours, has pain interfered with the following? General activity 8 Relation with others 5 Enjoyment of life 10 What TIME of day is your pain at its worst? evening Sleep (in general) Fair  Pain is worse with: walking, bending and standing Pain improves with: rest, heat/ice and medication Relief from Meds: 6  Mobility use a cane do you drive?  yes  Function retired  Neuro/Psych weakness tremor trouble walking  Prior Studies Any changes since last visit?  no  Physicians involved in your care Any changes since last visit?  no   Family History  Problem Relation Age of Onset  . Parkinsonism Father   . Dementia Father   . Cancer Father        Throat  . Colon cancer Mother        died in 42s  . Cancer Mother        Colon   Social History   Socioeconomic History  . Marital status: Married    Spouse name: Not on file  . Number of children: Not on file  . Years of education: Not on file  . Highest education level: Not on file    Occupational History  . Occupation: retired    Comment: maintenance; electrician  Tobacco Use  . Smoking status: Former Smoker    Packs/day: 2.00    Years: 28.00    Pack years: 56.00    Types: Cigarettes    Quit date: 05/31/1987    Years since quitting: 31.9  . Smokeless tobacco: Never Used  . Tobacco comment: quit smoking "in my 81's"  Substance and Sexual Activity  . Alcohol use: Yes    Alcohol/week: 0.0 standard drinks    Comment: rare, mikes hard lemonade  . Drug use: No    Types: Marijuana    Comment: hx of-4-5 yrs ago  . Sexual activity: Not Currently  Other Topics Concern  . Not on file  Social History Narrative   Married 33 years in 2015. Lived together for 12 years. 2 boys from first wife. 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       Hobbies: yardwork, [place] barn, woodwork, Orthoptist   Social Determinants of Health   Financial Resource Strain:   . Difficulty of Paying Living Expenses: Not on file  Food Insecurity:   . Worried About Estate manager/land agent  of Food in the Last Year: Not on file  . Ran Out of Food in the Last Year: Not on file  Transportation Needs:   . Lack of Transportation (Medical): Not on file  . Lack of Transportation (Non-Medical): Not on file  Physical Activity:   . Days of Exercise per Week: Not on file  . Minutes of Exercise per Session: Not on file  Stress:   . Feeling of Stress : Not on file  Social Connections:   . Frequency of Communication with Friends and Family: Not on file  . Frequency of Social Gatherings with Friends and Family: Not on file  . Attends Religious Services: Not on file  . Active Member of Clubs or Organizations: Not on file  . Attends Archivist Meetings: Not on file  . Marital Status: Not on file   Past Surgical History:  Procedure Laterality Date  . ABDOMINAL AORTIC ANEURYSM REPAIR   2010  . CAROTID ENDARTERECTOMY Left 09/20/2004  . CATARACT EXTRACTION, BILATERAL     2016-2017  . COLONOSCOPY    . CORONARY ARTERY BYPASS GRAFT N/A 06/27/2018   Procedure: CORONARY ARTERY BYPASS GRAFTING (CABG), ON PUMP, TIMES FOUR, USING LEFT INTERNAL MAMMARY ARTERY AND ENDOSCOPICALLY HARVESTED LEFT GREATER SAPHENOUS VEIN;  Surgeon: Melrose Nakayama, MD;  Location: [facility];  Service: Open Heart Surgery;  Laterality: N/A;  . ENDARTERECTOMY Right 10/12/2015   Procedure: RIGHT CAROTID ENDARTERECTOMY WITH PATCH ANGIOPLASTY;  Surgeon: Elam Dutch, MD;  Location: [facility];  Service: Vascular;  Laterality: Right;  . INGUINAL HERNIA REPAIR Left 02/05/2013   Procedure: HERNIA REPAIR INGUINAL ADULT;  Surgeon: Joyice Faster. Cornett, MD;  Location: [facility];  Service: General;  Laterality: Left;  . INGUINAL HERNIA REPAIR Left   . INSERTION OF MESH Left 02/05/2013   Procedure: INSERTION OF MESH;  Surgeon: Joyice Faster. Cornett, MD;  Location: [facility];  Service: General;  Laterality: Left;  . LEFT HEART CATH AND CORONARY ANGIOGRAPHY N/A 06/21/2018   Procedure: LEFT HEART CATH AND CORONARY ANGIOGRAPHY;  Surgeon: Jettie Booze, MD;  Location: [facility];  Service: Cardiovascular;  Laterality: N/A;  . SHOULDER ARTHROSCOPY WITH ROTATOR CUFF REPAIR AND SUBACROMIAL DECOMPRESSION Left 05/01/2014   Procedure: LEFT ARTHROSCOPY SHOULDER SUBACROMIAL DECOMPRESSION,DISTAL CLAVICAL RESECTION AND ROTATOR CUFF REPAIR;  Surgeon: Marin Shutter, MD;  Location: [facility];  Service: Orthopedics;  Laterality: Left;  . TEE WITHOUT CARDIOVERSION N/A 06/27/2018   Procedure: TRANSESOPHAGEAL ECHOCARDIOGRAM (TEE);  Surgeon: Melrose Nakayama, MD;  Location: [facility];  Service: Open Heart Surgery;  Laterality: N/A;  . TESTICLAR CYST EXCISION Left   . TONSILLECTOMY    . VASECTOMY     Past Medical History:  Diagnosis Date  . Arthritis    DDD- Lower Back  . Bradycardia   . CAP (community acquired  pneumonia) 03/17/2015   "THAT'S WHAT THEY ARE THINKING; THEY ARE NOT SURE"  . Carotid artery occlusion   . Chest pain 06/19/2018  . Chronic lower back pain    DDD  . CKD (chronic kidney disease)    Dr. Corliss Parish  . 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 Diverticulosis   . Enlarged prostate    takes Flomax daily  . History of blood transfusion    "probably when they did  aortic aneurysm"  . History of colon polyps    benign  . Hyperlipidemia   . Hypertension    takes Amlodipine and Zebeta daily  . Insomnia    takes Melatonin nightly  . Joint pain   . 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     . Muscle spasm    takes Zanaflex daily as needed  . Peripheral edema    takes Furosemide daily  . Pneumonia    hx of   . Rheumatic fever 1946  . Rheumatoid arthritis ([place])   . Short-term memory loss    minimal  . Shortness of breath dyspnea    with exertion but lasts very short period of time  . TIA (transient ischemic attack)    x 2;takes Plavix daily  . Urinary frequency    takes Flomax daily  . Urinary urgency    BP (!) 150/78   Pulse 60   Temp 97.8 F (36.6 C)   Ht 5' 7.5" (1.715 m)   Wt 188 lb (85.3 kg)   SpO2 97%   BMI 29.01 kg/m   Opioid Risk Score:   Fall Risk Score:  `1  Depression screen PHQ 2/9  Depression screen [facility] 2/9 05/09/2019 04/16/2019 04/16/2019 08/09/2018 04/16/2018 01/22/2018 10/19/2017  Decreased Interest 1 0 0 0 0 0 0  Down, Depressed, Hopeless 1 0 0 0 0 0 0  PHQ - 2 Score 2 0 0 0 0 0 0  Altered sleeping 0 - - - - - -  Tired, decreased energy 3 - - - - - -  Change in appetite 1 - - - - - -  Feeling bad or failure about yourself  1 - - - - - -  Trouble concentrating 1 - - - - - -  Moving slowly or fidgety/restless 0 - - - - - -  PHQ-9 Score 8 - - - -  - -  Some recent data might be hidden     Review of Systems  Constitutional: Negative.   HENT: Negative.   Eyes: Negative.   Respiratory: Positive for cough.   Cardiovascular: Negative.   Gastrointestinal: Negative.   Endocrine: Negative.   Genitourinary: Negative.   Musculoskeletal: Positive for arthralgias, back pain and gait problem.  Skin: Negative.   Allergic/Immunologic: Negative.   Neurological: Positive for tremors and weakness.  Hematological: Negative.   Psychiatric/Behavioral: Negative.   All other systems reviewed and are negative.      Objective:   Physical Exam Vitals and nursing note reviewed.  Constitutional:      Appearance: Normal appearance.  Cardiovascular:     Rate and Rhythm: Normal rate and regular rhythm.     Pulses: Normal pulses.     Heart sounds: Normal heart sounds.  Pulmonary:     Effort: Pulmonary effort is normal.     Breath sounds: Normal breath sounds.  Musculoskeletal:     Cervical back: Normal range of motion and neck supple.     Comments: Normal Muscle Bulk and Muscle Testing Reveals:  Upper Extremities: Full ROM and Muscle Strength 5/5  Lumbar Paraspinal Tenderness: L-4-L-5 Bilateral Greater Trochanter Tenderness Lower Extremities: Full ROM and Muscle Strength 5/5 Arises from chair slowly using cane for support Antalgic  Gait   Skin:    General: Skin is warm and dry.  Neurological:     Mental Status: He is alert and oriented to person, place, and time.  Psychiatric:  Mood and Affect: Mood normal.        Behavior: Behavior normal.           Assessment & Plan:  1. Lumbar Spondylosis/ Lumbar Stenosis:05/15/2019 Continue current medication regime. Refilled:Oxycodone 7.5/325 mg one tabletevery 8 hoursas needed #70.05/15/2019 We will continue the opioid monitoring program, this consists of regular clinic visits, examinations, urine drug screen, pill counts as well as use of [place] Controlled Substance  Reporting System. 2. Bilateral Knees OA/ChronicBilateralKnee Pain:S/PBilateral KneeInjection by Dr Paulla Fore on 02/26/2019, Orthopedist Following Dr. Paulla Fore.05/15/2019 3. Bilateral Greater Trochanter Bursitis: Orthopedist Following. Continue to alternate Ice and Heat Therapy. 05/15/2019   31minutes of face to face patient care time was spent during this visit. All questions were encouraged and answered.  F/U in 1 month

## 2019-05-17 ENCOUNTER — Other Ambulatory Visit: Payer: Self-pay | Admitting: Family Medicine

## 2019-05-21 ENCOUNTER — Other Ambulatory Visit: Payer: Self-pay

## 2019-05-21 ENCOUNTER — Ambulatory Visit (INDEPENDENT_AMBULATORY_CARE_PROVIDER_SITE_OTHER): Payer: Medicare HMO | Admitting: Family Medicine

## 2019-05-21 ENCOUNTER — Encounter: Payer: Self-pay | Admitting: Family Medicine

## 2019-05-21 ENCOUNTER — Telehealth: Payer: Self-pay

## 2019-05-21 DIAGNOSIS — G250 Essential tremor: Secondary | ICD-10-CM | POA: Diagnosis not present

## 2019-05-21 DIAGNOSIS — M170 Bilateral primary osteoarthritis of knee: Secondary | ICD-10-CM

## 2019-05-21 DIAGNOSIS — J9811 Atelectasis: Secondary | ICD-10-CM

## 2019-05-21 DIAGNOSIS — M7061 Trochanteric bursitis, right hip: Secondary | ICD-10-CM | POA: Diagnosis not present

## 2019-05-21 DIAGNOSIS — M7062 Trochanteric bursitis, left hip: Secondary | ICD-10-CM | POA: Diagnosis not present

## 2019-05-21 MED ORDER — DIAZEPAM 5 MG PO TABS: ORAL_TABLET | ORAL | 0 refills | Status: DC

## 2019-05-21 NOTE — Assessment & Plan Note (Signed)
Repeat injection given today.  Discussed with patient in great length.  Due to patient's other comorbidities cyst under the skin choices avoid other things we can potentially do at this time.  Patient is going to increase activity as tolerated.  Discussed icing regimen.  Patient will follow up with me again in 10 to 12 weeks.

## 2019-05-21 NOTE — Assessment & Plan Note (Signed)
Discussed with patient about the essential tremor.  Has been on a beta-blocker, discussed the possibility of further evaluation with neurology which patient declined.

## 2019-05-21 NOTE — Telephone Encounter (Signed)
LAST APPOINTMENT DATE: @12 /86/7519  NEXT APPOINTMENT DATE:@no  app   LAST REFILL:04/23/2019  QTY: #90

## 2019-05-21 NOTE — Patient Instructions (Signed)
Good to see you Happy Holidays If your pulse ox does not raise up in the 90s in 2 days please go into ED See me again in 10 weeks

## 2019-05-21 NOTE — Assessment & Plan Note (Signed)
Bilateral injection given today.  Tolerated the procedure well, discussed icing regimen and home exercise, discussed which activities to do which wants to avoid.  Patient is to increase activity slowly over the course the next several days.  Follow-up again in 4 to 8 weeks

## 2019-05-21 NOTE — Assessment & Plan Note (Signed)
History of atelectasis and history of atrial fibrillation.

## 2019-05-21 NOTE — Progress Notes (Signed)
[name] [address] [place], [place] [address] Phone: [phone] Subjective:     CC: Bilateral knee and bilateral hip pain  PIR:[id number]   02/26/2019 Patient given injection today, tolerated procedure well, discussed which activities to do which wants to avoid.  Patient is to increase activity slowly.  Follow-up again in 4 to 8 weeks past medical history is significant though for lumbar spinal stenosis but is seen another provider for this.  Epidurals could be beneficial.  Bilateral viscosupplementation given today.  Tolerated the procedure well.  Discussed icing regimen and home exercises, discussed which activities of doing which wants to avoid.  Patient will increase activity as tolerated.  Follow-up again in 4 to 8 weeks  Update 12.22.2020 Kevin [name] is a 78 y.o. male coming in with complaint of left hip pain and knee pain. Patient states that his pain is worse than last visit. Does feel that the injections did help somewhat.  Patient has pain all over.  Patient has many different comorbidities.  Patient is seeing a chronic pain specialist and is getting narcotics for most of his pain.  Patient is here today because he feels our injection seemed to help more than any other ones.  Also notes that right hip pain has increased. Left is also bothering him. Is unsure if injection did help. Pain increases during the night and this causes him to wake up.     Past Medical History:  Diagnosis Date  . Arthritis    DDD- Lower Back  . Bradycardia   . CAP (community acquired pneumonia) 03/17/2015   "THAT'S WHAT THEY ARE THINKING; THEY ARE NOT SURE"  . Carotid artery occlusion   . Chest pain 06/19/2018  . Chronic lower back pain    DDD  . CKD (chronic kidney disease)    Dr. Corliss Parish  . 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 Diverticulosis   . Enlarged prostate    takes Flomax daily  . History of blood transfusion    "probably when they did aortic aneurysm"  . History of colon polyps    benign  . Hyperlipidemia   . Hypertension    takes Amlodipine and Zebeta daily  . Insomnia    takes Melatonin nightly  . Joint pain   . 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     . Muscle spasm    takes Zanaflex daily as needed  . Peripheral edema    takes Furosemide daily  . Pneumonia    hx of   . Rheumatic fever 1946  . Rheumatoid arthritis ([place])   . Short-term memory loss    minimal  . Shortness of breath dyspnea    with exertion but lasts very short period of time  . TIA (transient ischemic attack)    x 2;takes Plavix daily  . Urinary frequency    takes Flomax daily  . Urinary urgency    Past Surgical History:  Procedure Laterality Date  . ABDOMINAL AORTIC ANEURYSM REPAIR  2010  . CAROTID ENDARTERECTOMY Left 09/20/2004  . CATARACT EXTRACTION, BILATERAL     2016-2017  . COLONOSCOPY    . 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 ENDARTERECTOMY Right 10/12/2015   Procedure: RIGHT CAROTID ENDARTERECTOMY WITH PATCH ANGIOPLASTY;  Surgeon: Elam Dutch, MD;  Location: [facility];  Service: Vascular;  Laterality: Right;  . INGUINAL HERNIA REPAIR Left 02/05/2013   Procedure: HERNIA REPAIR INGUINAL ADULT;  Surgeon: Joyice Faster. Cornett, MD;  Location: [facility];  Service: General;  Laterality: Left;  . INGUINAL HERNIA REPAIR Left   . INSERTION OF MESH Left 02/05/2013   Procedure: INSERTION OF MESH;  Surgeon: Joyice Faster. Cornett, MD;  Location: [facility];   Service: General;  Laterality: Left;  . LEFT HEART CATH AND CORONARY ANGIOGRAPHY N/A 06/21/2018   Procedure: LEFT HEART CATH AND CORONARY ANGIOGRAPHY;  Surgeon: Jettie Booze, MD;  Location: [facility];  Service: Cardiovascular;  Laterality: N/A;  . SHOULDER ARTHROSCOPY WITH ROTATOR CUFF REPAIR AND SUBACROMIAL DECOMPRESSION Left 05/01/2014   Procedure: LEFT ARTHROSCOPY SHOULDER SUBACROMIAL DECOMPRESSION,DISTAL CLAVICAL RESECTION AND ROTATOR CUFF REPAIR;  Surgeon: Marin Shutter, MD;  Location: [facility];  Service: Orthopedics;  Laterality: Left;  . TEE WITHOUT CARDIOVERSION N/A 06/27/2018   Procedure: TRANSESOPHAGEAL ECHOCARDIOGRAM (TEE);  Surgeon: Melrose Nakayama, MD;  Location: [facility];  Service: Open Heart Surgery;  Laterality: N/A;  . TESTICLAR CYST EXCISION Left   . TONSILLECTOMY    . VASECTOMY     Social History   Socioeconomic History  . Marital status: Married    Spouse name: Not on file  . Number of children: Not on file  . Years of education: Not on file  . Highest education level: Not on file  Occupational History  . Occupation: retired    Comment: maintenance; electrician  Tobacco Use  . Smoking status: Former Smoker    Packs/day: 2.00    Years: 28.00    Pack years: 56.00    Types: Cigarettes    Quit date: 05/31/1987    Years since quitting: 31.9  . Smokeless tobacco: Never Used  . Tobacco comment: quit smoking "in my 56's"  Substance and Sexual Activity  . Alcohol use: Yes    Alcohol/week: 0.0 standard drinks    Comment: rare, mikes hard lemonade  . Drug use: No    Types: Marijuana    Comment: hx of-4-5 yrs ago  . Sexual activity: Not Currently  Other Topics Concern  . Not on file  Social History Narrative   Married 33 years in 2015. Lived together for 12 years. 2 boys from first wife. 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        Hobbies: yardwork, [place] barn, woodwork, Orthoptist   Social Determinants of Health   Financial Resource Strain:   . Difficulty of Paying Living Expenses: Not on file  Food Insecurity:   . Worried About Charity fundraiser in the Last Year: Not on file  . Ran Out of Food in the Last Year: Not on file  Transportation Needs:   . Lack of Transportation (Medical): Not on file  . Lack of Transportation (Non-Medical): Not on file  Physical Activity:   . Days of Exercise per Week: Not on file  . Minutes of Exercise per Session: Not on file  Stress:   .  Feeling of Stress : Not on file  Social Connections:   . Frequency of Communication with Friends and Family: Not on file  . Frequency of Social Gatherings with Friends and Family: Not on file  . Attends Religious Services: Not on file  . Active Member of Clubs or Organizations: Not on file  . Attends Archivist Meetings: Not on file  . Marital Status: Not on file   No Known Allergies Family History  Problem Relation Age of Onset  . Parkinsonism Father   . Dementia Father   . Cancer Father        Throat  . Colon cancer Mother        died in 3s  . Cancer Mother        Colon     Current Outpatient Medications (Cardiovascular):  .  atorvastatin (LIPITOR) 20 MG tablet, TAKE 1 TABLET EVERY DAY .  fenofibrate 160 MG tablet, TAKE 1 TABLET EVERY DAY (Patient taking differently: Take 160 mg by mouth daily. ) .  furosemide (LASIX) 20 MG tablet, Take 1 tablet (20 mg total) by mouth daily. .  metoprolol tartrate (LOPRESSOR) 25 MG tablet, Take 1 tablet (25 mg total) by mouth 2 (two) times daily.  Current Outpatient Medications (Respiratory):  .  albuterol (VENTOLIN HFA) 108 (90 Base) MCG/ACT inhaler, Inhale 2 puffs into the lungs every 12 (twelve) hours as needed for wheezing or shortness of breath. .  benzonatate (TESSALON) 100 MG capsule, Take 1 capsule (100 mg total) by mouth 2 (two) times daily as needed for  cough.  Current Outpatient Medications (Analgesics):  .  aspirin EC 81 MG EC tablet, Take 1 tablet (81 mg total) by mouth daily. Marland Kitchen  oxyCODONE-acetaminophen (PERCOCET) 7.5-325 MG tablet, Take 1 tablet by mouth every 8 (eight) hours as needed for severe pain.  Current Outpatient Medications (Hematological):  .  vitamin B-12 (CYANOCOBALAMIN) 1000 MCG tablet, Take 1,000 mcg by mouth daily.  Current Outpatient Medications (Other):  .  BIOTIN PO, Take 1 tablet by mouth daily. .  Cholecalciferol (VITAMIN D-3) 1000 UNITS CAPS, Take 1,000 Units by mouth daily.  .  diazepam (VALIUM) 5 MG tablet, TAKE 1/2 TO 1 TABLET q8 hours PRN pain sleep/muscle spasm-only if wife is around+can watch you closely when taking due to sedation risk .  gabapentin (NEURONTIN) 400 MG capsule, Take 1 capsule (400 mg total) by mouth 2 (two) times daily. 400 mg twice a day .  linaclotide (LINZESS) 145 MCG CAPS capsule, TAKE 1 CAPSULE BY MOUTH ONCE DAILY BEFORE BREAKFAST .  Melatonin 10 MG TABS, Take 10 mg by mouth at bedtime.  .  tamsulosin (FLOMAX) 0.4 MG CAPS capsule, TAKE 1 CAPSULE EVERY DAY (Patient taking differently: Take 0.4 mg by mouth daily. ) .  tiZANidine (ZANAFLEX) 4 MG tablet, TAKE 1 TABLET EVERY 6 HOURS AS NEEDED FOR MUSCLE SPASM(S) (Patient taking differently: Take 4 mg by mouth at bedtime. )    Past medical history, social, surgical and family history all reviewed in electronic medical record.  No pertanent information unless stated regarding to the chief complaint.   Review of Systems:  No headache, visual changes, nausea, vomiting, diarrhea, constipation, dizziness, abdominal pain, skin rash, fevers, chills, night sweats, weight loss, swollen lymph nodes,, chest pain, shortness of breath, mood changes.  Positive muscle aches and body aches  Objective  Blood pressure 138/88, pulse (!) 166, height 5' 7.5" (1.715 m), weight 188 lb (85.3 kg), SpO2 (!) 85 %.    General:  No apparent distress alert and oriented  x3 mood and affect normal, dressed appropriately.  Mild masked facies HEENT: Pupils equal, extraocular movements intact  Respiratory: Patient's speak in full sentences and does not appear short of breath  Cardiovascular: No lower extremity edema, non tender, no erythema  Skin: Warm dry intact with no signs of infection or rash on extremities or on axial skeleton.  Abdomen: Soft nontender  Neuro: Cranial nerves II through XII are intact, neurovascularly intact in all extremities with 2+ DTRs and 2+ pulses.  Lymph: No lymphadenopathy of posterior or anterior cervical chain or axillae bilaterally.  Gait mild shuffling gait MSK: Resting tremor in both upper extremities but also does have more of a pronounced tremor with activity as well.  Knee: Bilateral valgus deformity noted.  Abnormal thigh to calf ratio.  Tender to palpation over medial and PF joint line.  ROM full in flexion and extension and lower leg rotation. instability with valgus force.  painful patellar compression. Patellar glide with moderate crepitus. Patellar and quadriceps tendons unremarkable. Hamstring and quadriceps strength is normal.   Bilateral hip exam does show the patient has tenderness to palpation of the greater trochanteric area.  Mild limited internal and external range of motion.  Negative straight leg test.  Neurovascular intact distally with 4 out of 5 strength.  After informed written and verbal consent, patient was seated on exam table. Right knee was prepped with alcohol swab and utilizing anterolateral approach, patient's right knee space was injected with 4:1  marcaine 0.5%: Kenalog 40mg /dL. Patient tolerated the procedure well without immediate complications.  After informed written and verbal consent, patient was seated on exam table. Left knee was prepped with alcohol swab and utilizing anterolateral approach, patient's left knee space was injected with 4:1  marcaine 0.5%: Kenalog 40mg /dL. Patient  tolerated the procedure well without immediate complications.  After verbal consent patient was prepped with alcohol swabs and with a 21-gauge 2 inch needle injected into the right greater trochanteric area with a total of 2 cc of 0.5% Marcaine and 1 cc of Kenalog 40 mg/mL.  No blood loss.  Postinjection instructions given after Band-Aid placed.  After verbal consent patient was prepped with alcohol swabs and with a 21-gauge 2 inch needle injected into the left greater trochanteric area with a total of 2 cc of 0.5% Marcaine and 1 cc of Kenalog 40 mg/mL.  No blood loss.  Band-Aid placed.  Postinjection instructions given.   Impression and Recommendations:     This case required medical decision making of moderate complexity. The above documentation has been reviewed and is accurate and complete Lyndal Pulley, DO       Note: This dictation was prepared with Dragon dictation along with smaller phrase technology. Any transcriptional errors that result from this process are unintentional.

## 2019-05-22 NOTE — Telephone Encounter (Signed)
Ok to fill 

## 2019-05-23 ENCOUNTER — Other Ambulatory Visit: Payer: Self-pay | Admitting: Family Medicine

## 2019-05-23 MED ORDER — METOPROLOL TARTRATE 25 MG PO TABS: 25.0000 mg | ORAL_TABLET | Freq: Two times a day (BID) | ORAL | 1 refills | Status: DC

## 2019-05-23 NOTE — Telephone Encounter (Signed)
Pt needs a refill on metoprolol tartrate 25 mg #90 w/refills  to [facility] order

## 2019-06-13 ENCOUNTER — Encounter: Payer: Medicare HMO | Attending: Physical Medicine & Rehabilitation | Admitting: Registered Nurse

## 2019-06-13 ENCOUNTER — Encounter: Payer: Self-pay | Admitting: Registered Nurse

## 2019-06-13 ENCOUNTER — Other Ambulatory Visit: Payer: Self-pay

## 2019-06-13 VITALS — BP 126/83 | HR 77 | Ht 67.5 in | Wt 188.0 lb

## 2019-06-13 DIAGNOSIS — M25551 Pain in right hip: Secondary | ICD-10-CM | POA: Insufficient documentation

## 2019-06-13 DIAGNOSIS — Z79891 Long term (current) use of opiate analgesic: Secondary | ICD-10-CM

## 2019-06-13 DIAGNOSIS — M1712 Unilateral primary osteoarthritis, left knee: Secondary | ICD-10-CM | POA: Insufficient documentation

## 2019-06-13 DIAGNOSIS — Z5181 Encounter for therapeutic drug level monitoring: Secondary | ICD-10-CM | POA: Diagnosis not present

## 2019-06-13 DIAGNOSIS — G894 Chronic pain syndrome: Secondary | ICD-10-CM

## 2019-06-13 DIAGNOSIS — M47816 Spondylosis without myelopathy or radiculopathy, lumbar region: Secondary | ICD-10-CM

## 2019-06-13 DIAGNOSIS — M1711 Unilateral primary osteoarthritis, right knee: Secondary | ICD-10-CM

## 2019-06-13 DIAGNOSIS — I739 Peripheral vascular disease, unspecified: Secondary | ICD-10-CM | POA: Insufficient documentation

## 2019-06-13 MED ORDER — OXYCODONE-ACETAMINOPHEN 7.5-325 MG PO TABS: 1.0000 | ORAL_TABLET | Freq: Three times a day (TID) | ORAL | 0 refills | Status: DC | PRN

## 2019-06-13 NOTE — Progress Notes (Signed)
Subjective:    Patient ID: Kevin [name], male    DOB: 1940/08/17, 79 y.o.   MRN: [id number]  HPI: Kevin [name] is a 79 y.o. male whose appointment was changed to a virtual office visit to reduce the risk of exposure to the COVID-19 virus and to help Kevin [name] remain healthy and safe. The virtual visit will also provide continuity of care. Kevin [name] agrees with the tele-health visit and verbalizes understanding.  He states his pain is located in his lower back and left knee. He rates his pain 5. His  current exercise regime is walking and light outside chores such as hauling and chopping wood.  Kevin [name] Morphine equivalent is 32.81 MME. He is also prescribed Diazepam by his PCP. We have discussed the black box warning of using opioids and benzodiazepines. I highlighted the dangers of using these drugs together and discussed the adverse events including respiratory suppression, overdose, cognitive impairment and importance of compliance with current regimen. We will continue to monitor and adjust as indicated..     Last Oral Swab was Performed on 03/18/2019, it was consistent.   Kevin [name] performed the Health and History Questions. This provider and Mancel Parsons verified we were speaking with the correct person using two identifiers.   Pain Inventory Average Pain 4 Pain Right Now 5 My pain is constant and aching  In the last 24 hours, has pain interfered with the following? General activity 8 Relation with others 8 Enjoyment of life 10 What TIME of day is your pain at its worst? always Sleep (in general) Fair  Pain is worse with: walking, bending, sitting, inactivity, standing and some activites Pain improves with: rest and medication Relief from Meds: 8  Mobility walk with assistance use a cane ability to climb steps?  yes do you drive?  yes  Function retired  Neuro/Psych bladder control problems weakness numbness tremor tingling trouble  walking dizziness confusion  Prior Studies Any changes since last visit?  no  Physicians involved in your care Any changes since last visit?  no   Family History  Problem Relation Age of Onset  . Parkinsonism Father   . Dementia Father   . Cancer Father        Throat  . Colon cancer Mother        died in 81s  . Cancer Mother        Colon   Social History   Socioeconomic History  . Marital status: Married    Spouse name: Not on file  . Number of children: Not on file  . Years of education: Not on file  . Highest education level: Not on file  Occupational History  . Occupation: retired    Comment: maintenance; electrician  Tobacco Use  . Smoking status: Former Smoker    Packs/day: 2.00    Years: 28.00    Pack years: 56.00    Types: Cigarettes    Quit date: 05/31/1987    Years since quitting: 32.0  . Smokeless tobacco: Never Used  . Tobacco comment: quit smoking "in my 50's"  Substance and Sexual Activity  . Alcohol use: Yes    Alcohol/week: 0.0 standard drinks    Comment: rare, mikes hard lemonade  . Drug use: No    Types: Marijuana    Comment: hx of-4-5 yrs ago  . Sexual activity: Not Currently  Other Topics Concern  . Not on file  Social History Narrative   Married 33 years in 2015.  Lived together for 12 years. 2 boys from first wife. 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       Hobbies: yardwork, [place] barn, woodwork, Orthoptist   Social Determinants of Health   Financial Resource Strain:   . Difficulty of Paying Living Expenses: Not on file  Food Insecurity:   . Worried About Charity fundraiser in the Last Year: Not on file  . Ran Out of Food in the Last Year: Not on file  Transportation Needs:   . Lack of Transportation (Medical): Not on file  . Lack of Transportation (Non-Medical): Not on file  Physical Activity:   . Days  of Exercise per Week: Not on file  . Minutes of Exercise per Session: Not on file  Stress:   . Feeling of Stress : Not on file  Social Connections:   . Frequency of Communication with Friends and Family: Not on file  . Frequency of Social Gatherings with Friends and Family: Not on file  . Attends Religious Services: Not on file  . Active Member of Clubs or Organizations: Not on file  . Attends Archivist Meetings: Not on file  . Marital Status: Not on file   Past Surgical History:  Procedure Laterality Date  . ABDOMINAL AORTIC ANEURYSM REPAIR  2010  . CAROTID ENDARTERECTOMY Left 09/20/2004  . CATARACT EXTRACTION, BILATERAL     2016-2017  . COLONOSCOPY    . 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 ENDARTERECTOMY Right 10/12/2015   Procedure: RIGHT CAROTID ENDARTERECTOMY WITH PATCH ANGIOPLASTY;  Surgeon: Elam Dutch, MD;  Location: [facility];  Service: Vascular;  Laterality: Right;  . INGUINAL HERNIA REPAIR Left 02/05/2013   Procedure: HERNIA REPAIR INGUINAL ADULT;  Surgeon: Joyice Faster. Cornett, MD;  Location: [facility];  Service: General;  Laterality: Left;  . INGUINAL HERNIA REPAIR Left   . INSERTION OF MESH Left 02/05/2013   Procedure: INSERTION OF MESH;  Surgeon: Joyice Faster. Cornett, MD;  Location: [facility];  Service: General;  Laterality: Left;  . LEFT HEART CATH AND CORONARY ANGIOGRAPHY N/A 06/21/2018   Procedure: LEFT HEART CATH AND CORONARY ANGIOGRAPHY;  Surgeon: Jettie Booze, MD;  Location: [facility];  Service: Cardiovascular;  Laterality: N/A;  . SHOULDER ARTHROSCOPY WITH ROTATOR CUFF REPAIR AND SUBACROMIAL DECOMPRESSION Left 05/01/2014   Procedure: LEFT  ARTHROSCOPY SHOULDER SUBACROMIAL DECOMPRESSION,DISTAL CLAVICAL RESECTION AND ROTATOR CUFF REPAIR;  Surgeon: Marin Shutter, MD;  Location: [facility];  Service: Orthopedics;  Laterality: Left;  . TEE WITHOUT CARDIOVERSION N/A 06/27/2018   Procedure: TRANSESOPHAGEAL ECHOCARDIOGRAM (TEE);  Surgeon: Melrose Nakayama, MD;  Location: [facility];  Service: Open Heart Surgery;  Laterality: N/A;  . TESTICLAR CYST EXCISION Left   . TONSILLECTOMY    . VASECTOMY     Past Medical History:  Diagnosis Date  . Arthritis    DDD- Lower Back  . Bradycardia   . CAP (community acquired pneumonia) 03/17/2015   "THAT'S WHAT THEY ARE THINKING; THEY ARE NOT SURE"  . Carotid artery occlusion   . Chest pain  06/19/2018  . Chronic lower back pain    DDD  . CKD (chronic kidney disease)    Dr. Corliss Parish  . 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 Diverticulosis   . Enlarged prostate    takes Flomax daily  . History of blood transfusion    "probably when they did aortic aneurysm"  . History of colon polyps    benign  . Hyperlipidemia   . Hypertension    takes Amlodipine and Zebeta daily  . Insomnia    takes Melatonin nightly  . Joint pain   . 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     . Muscle spasm    takes Zanaflex daily as needed  . Peripheral edema    takes Furosemide daily  . Pneumonia    hx of   . Rheumatic fever 1946  . Rheumatoid arthritis ([place])   . Short-term memory loss    minimal  . Shortness of breath dyspnea    with exertion but lasts very short period of time  . TIA (transient ischemic attack)    x 2;takes Plavix daily  . Urinary frequency    takes Flomax daily  . Urinary urgency    There were no vitals taken for this visit.  Opioid Risk Score:   Fall Risk Score:  `1  Depression screen  PHQ 2/9  Depression screen [facility] 2/9 05/09/2019 04/16/2019 04/16/2019 08/09/2018 04/16/2018 01/22/2018 10/19/2017  Decreased Interest 1 0 0 0 0 0 0  Down, Depressed, Hopeless 1 0 0 0 0 0 0  PHQ - 2 Score 2 0 0 0 0 0 0  Altered sleeping 0 - - - - - -  Tired, decreased energy 3 - - - - - -  Change in appetite 1 - - - - - -  Feeling bad or failure about yourself  1 - - - - - -  Trouble concentrating 1 - - - - - -  Moving slowly or fidgety/restless 0 - - - - - -  PHQ-9 Score 8 - - - - - -  Some recent data might be hidden    Review of Systems  Constitutional: Negative.   HENT: Negative.   Eyes: Negative.   Respiratory: Negative.   Cardiovascular: Negative.   Gastrointestinal: Negative.   Endocrine: Negative.   Genitourinary: Positive for frequency.  Musculoskeletal: Positive for arthralgias, back pain and gait problem.  Allergic/Immunologic: Negative.   Neurological: Positive for dizziness, tremors, weakness and numbness.       Tingling  Psychiatric/Behavioral: Positive for confusion.  All other systems reviewed and are negative.      Objective:   Physical Exam Vitals and nursing note reviewed.  Musculoskeletal:     Comments: No Physical Exam Performed: Virtual Visit           Assessment & Plan:  1. Lumbar Spondylosis/ Lumbar Stenosis:06/13/2019 Continue current medication regime. Refilled:Oxycodone 7.5/325 mg one tabletevery 8 hoursas needed #70.06/13/2019 We will continue the opioid monitoring program, this consists of regular clinic visits, examinations, urine drug screen, pill counts as well as use of [place] Controlled Substance Reporting System. 2.Bilateral Knees OA/ChronicBilateralKnee Pain:S/PBilateral KneeInjection by Dr Paulla Fore on 02/26/2019, Orthopedist Following Dr. Paulla Fore.06/13/2019. 3.Bilateral Greater Trochanter Bursitis: No complaints today. Orthopedist Following. Continue to alternate Ice and Heat Therapy.  06/13/2019  F/U in 1  month Tele-Health Visit Telephone Call Established Patient Location of Patient in his Home Location of Provider: office

## 2019-06-14 ENCOUNTER — Other Ambulatory Visit: Payer: Self-pay

## 2019-06-17 ENCOUNTER — Other Ambulatory Visit: Payer: Self-pay

## 2019-06-17 MED ORDER — FENOFIBRATE 160 MG PO TABS: 160.0000 mg | ORAL_TABLET | Freq: Every day | ORAL | 3 refills | Status: DC

## 2019-06-17 MED ORDER — TAMSULOSIN HCL 0.4 MG PO CAPS: 0.4000 mg | ORAL_CAPSULE | Freq: Every day | ORAL | 3 refills | Status: DC

## 2019-06-24 ENCOUNTER — Other Ambulatory Visit: Payer: Self-pay | Admitting: Family Medicine

## 2019-06-25 ENCOUNTER — Other Ambulatory Visit: Payer: Self-pay

## 2019-06-25 ENCOUNTER — Ambulatory Visit (INDEPENDENT_AMBULATORY_CARE_PROVIDER_SITE_OTHER): Payer: Medicare HMO

## 2019-06-25 ENCOUNTER — Other Ambulatory Visit: Payer: Medicare HMO

## 2019-06-25 DIAGNOSIS — R059 Cough, unspecified: Secondary | ICD-10-CM

## 2019-06-25 DIAGNOSIS — R05 Cough: Secondary | ICD-10-CM | POA: Diagnosis not present

## 2019-06-25 DIAGNOSIS — D649 Anemia, unspecified: Secondary | ICD-10-CM

## 2019-06-25 DIAGNOSIS — R0602 Shortness of breath: Secondary | ICD-10-CM | POA: Diagnosis not present

## 2019-06-25 MED ORDER — TAMSULOSIN HCL 0.4 MG PO CAPS: 0.4000 mg | ORAL_CAPSULE | Freq: Every day | ORAL | 3 refills | Status: DC

## 2019-06-25 MED ORDER — FENOFIBRATE 160 MG PO TABS: 160.0000 mg | ORAL_TABLET | Freq: Every day | ORAL | 3 refills | Status: DC

## 2019-06-25 NOTE — Progress Notes (Signed)
Chest x-ray again shows an opacity in the left lung base-we cannot confidently rule out pneumonia.  This looks very similar to last x-ray in November.  An alternate to treating for pneumonia again would be to get a CT of the chest without contrast for more information-avoiding contrast due to kidney function.  Team may order CT of chest without contrast under left lower lung opacity if patient agrees to this

## 2019-06-26 ENCOUNTER — Other Ambulatory Visit: Payer: Self-pay

## 2019-06-26 DIAGNOSIS — R918 Other nonspecific abnormal finding of lung field: Secondary | ICD-10-CM

## 2019-06-28 ENCOUNTER — Other Ambulatory Visit: Payer: Medicare HMO

## 2019-07-01 ENCOUNTER — Telehealth: Payer: Self-pay

## 2019-07-01 NOTE — Telephone Encounter (Signed)
Returned call and phone went straight to vm.

## 2019-07-01 NOTE — Telephone Encounter (Signed)
Patient wife calling back

## 2019-07-01 NOTE — Telephone Encounter (Signed)
Patient wife calling to speak with Dr.Hunter or nurse regarding pt. Wife wouldn't provide additional information. [phone]

## 2019-07-02 NOTE — Telephone Encounter (Signed)
I agree- lets have them hold amlodipine and update [place] with blood pressures perhaps mid next week- possible lower blood pressures could cause those type of symptoms.

## 2019-07-02 NOTE — Telephone Encounter (Signed)
Called wife back states that Saturday night after he had taken all meds he had episode where he went pale and turned cold He was mumbling something and telling wife he did not feel right. He was like that until for about 10 minutes. When he opened eyes he was very sleepy and she had to help him in the bed. He then slept for 5 hrs. He was very weak the next day but felt good but was back to normal the next day.   Wife states that she did not call 911 when it happened because he refuses to go to back to hospital. He has not had issues after this. She wanted to know if he needs to be seen of if you have any recommendations

## 2019-07-02 NOTE — Telephone Encounter (Signed)
I would testing for Covid under weakness.  I do not think this is Covid but I think we need to rule this out as I have seen some weird presentations.  Once we get a negative test would be reasonable to see in the office to check him out-if he has recurrent symptoms would seek care in the hospital.  There is a possibility this could have been a mini stroke/TIA.  Did his blood pressure happened to drop when this episode occurred?

## 2019-07-02 NOTE — Telephone Encounter (Signed)
Called patient gave information they will go for covid test. They did not check bp at time of symptoms. He did mention that he had amlodipine that had been taken off his med list in Aug. He states that his bp have been in range or low and he has not been taking. I advised that he not take unless we call him back and tell otherwise.

## 2019-07-03 ENCOUNTER — Ambulatory Visit: Payer: Medicare HMO | Attending: Internal Medicine

## 2019-07-03 DIAGNOSIS — Z20822 Contact with and (suspected) exposure to covid-19: Secondary | ICD-10-CM | POA: Diagnosis not present

## 2019-07-03 NOTE — Telephone Encounter (Signed)
Called and left detailed message with below information on vm.

## 2019-07-04 ENCOUNTER — Telehealth: Payer: Self-pay

## 2019-07-04 LAB — NOVEL CORONAVIRUS, NAA: SARS-CoV-2, NAA: NOT DETECTED

## 2019-07-04 NOTE — Telephone Encounter (Signed)
Patient wife is calling in to provide results of covid-19. Please return patient call

## 2019-07-04 NOTE — Telephone Encounter (Signed)
Patients wife states that he is negative and was told to get an appointment scheduled for a follow up.Marland Kitchen

## 2019-07-04 NOTE — Telephone Encounter (Signed)
Returned pt call and lm on pt vm tcb.

## 2019-07-05 NOTE — Telephone Encounter (Signed)
Ok to schedule pt for in office visit.

## 2019-07-05 NOTE — Telephone Encounter (Signed)
Scheduled patient for Monday @ 1:40

## 2019-07-08 ENCOUNTER — Other Ambulatory Visit: Payer: Self-pay

## 2019-07-08 ENCOUNTER — Encounter: Payer: Self-pay | Admitting: Family Medicine

## 2019-07-08 ENCOUNTER — Ambulatory Visit (INDEPENDENT_AMBULATORY_CARE_PROVIDER_SITE_OTHER): Payer: Medicare HMO | Admitting: Family Medicine

## 2019-07-08 VITALS — BP 158/86 | HR 76 | Temp 98.7°F | Ht 67.5 in | Wt 187.0 lb

## 2019-07-08 DIAGNOSIS — R5383 Other fatigue: Secondary | ICD-10-CM

## 2019-07-08 DIAGNOSIS — D649 Anemia, unspecified: Secondary | ICD-10-CM | POA: Diagnosis not present

## 2019-07-08 DIAGNOSIS — R918 Other nonspecific abnormal finding of lung field: Secondary | ICD-10-CM

## 2019-07-08 DIAGNOSIS — R7301 Impaired fasting glucose: Secondary | ICD-10-CM | POA: Diagnosis not present

## 2019-07-08 DIAGNOSIS — E782 Mixed hyperlipidemia: Secondary | ICD-10-CM | POA: Diagnosis not present

## 2019-07-08 LAB — LIPID PANEL
Cholesterol: 150 mg/dL (ref 0–200)
HDL: 37.5 mg/dL — ABNORMAL LOW (ref >39.00)
LDL Cholesterol: 81 mg/dL (ref 0–99)
NonHDL: 112.34
Total CHOL/HDL Ratio: 4
Triglycerides: 158 mg/dL — ABNORMAL HIGH (ref 0.0–149.0)
VLDL: 31.6 mg/dL (ref 0.0–40.0)

## 2019-07-08 LAB — CBC WITH DIFFERENTIAL/PLATELET
Basophils Absolute: 0.1 10*3/uL (ref 0.0–0.1)
Basophils Relative: 1.2 % (ref 0.0–3.0)
Eosinophils Absolute: 0.3 10*3/uL (ref 0.0–0.7)
Eosinophils Relative: 2.9 % (ref 0.0–5.0)
HCT: 38.8 % — ABNORMAL LOW (ref 39.0–52.0)
Hemoglobin: 13 g/dL (ref 13.0–17.0)
Lymphocytes Relative: 26.8 % (ref 12.0–46.0)
Lymphs Abs: 2.5 10*3/uL (ref 0.7–4.0)
MCHC: 33.5 g/dL (ref 30.0–36.0)
MCV: 92.4 fl (ref 78.0–100.0)
Monocytes Absolute: 0.7 10*3/uL (ref 0.1–1.0)
Monocytes Relative: 6.9 % (ref 3.0–12.0)
Neutro Abs: 5.9 10*3/uL (ref 1.4–7.7)
Neutrophils Relative %: 62.2 % (ref 43.0–77.0)
Platelets: 219 10*3/uL (ref 150.0–400.0)
RBC: 4.2 Mil/uL — ABNORMAL LOW (ref 4.22–5.81)
RDW: 14.6 % (ref 11.5–15.5)
WBC: 9.4 10*3/uL (ref 4.0–10.5)

## 2019-07-08 LAB — HEMOGLOBIN A1C: Hgb A1c MFr Bld: 6.1 % (ref 4.6–6.5)

## 2019-07-08 LAB — COMPREHENSIVE METABOLIC PANEL
ALT: 21 U/L (ref 0–53)
AST: 25 U/L (ref 0–37)
Albumin: 4.2 g/dL (ref 3.5–5.2)
Alkaline Phosphatase: 33 U/L — ABNORMAL LOW (ref 39–117)
BUN: 23 mg/dL (ref 6–23)
CO2: 27 mEq/L (ref 19–32)
Calcium: 9.4 mg/dL (ref 8.4–10.5)
Chloride: 103 mEq/L (ref 96–112)
Creatinine, Ser: 2.04 mg/dL — ABNORMAL HIGH (ref 0.40–1.50)
GFR: 31.53 mL/min — ABNORMAL LOW (ref >60.00)
Glucose, Bld: 100 mg/dL — ABNORMAL HIGH (ref 70–99)
Potassium: 5.3 mEq/L — ABNORMAL HIGH (ref 3.5–5.1)
Sodium: 137 mEq/L (ref 135–145)
Total Bilirubin: 0.7 mg/dL (ref 0.2–1.2)
Total Protein: 6.8 g/dL (ref 6.0–8.3)

## 2019-07-08 NOTE — Patient Instructions (Addendum)
Health Maintenance Due  Topic Date Due  . COLONOSCOPY turned in stool cards today  06/20/2018   Schedule visit with Dr. Harrington Challenger for follow up- I would specifically as her opinion about a fib and coumadin in light of possible TIA/mini stroke recently. If you have recurrent symptoms- seek care immediately in emergency room/call 911.   In regards to blood pressures there is so much variability I hesitate to restart amlodipine. Please send me the #s again in a week- if we are seeing many more over 170 I may start back a lower dose. I dont mind your averages but id like your top # to remain below 155 if possible and bottom # to remain above 100.   This may have been a mini stroke, it could have been blood changes- not clear at the moment. If has another episode seek care but also perhaps get blood sugar, blood pressure, heart rate, oxygen just for more info.   Recommended follow up: Return in about 1 month (around 08/05/2019) for follow up- or sooner if needed.

## 2019-07-08 NOTE — Progress Notes (Signed)
Phone [phone] In person visit   Subjective:   Kevin [name] is a 79 y.o. year old very pleasant male patient who presents for/with See problem oriented charting Chief Complaint  Patient presents with  . Hypertension   This visit occurred during the SARS-CoV-2 public health emergency.  Safety protocols were in place, including screening questions prior to the visit, additional usage of staff PPE, and extensive cleaning of exam room while observing appropriate contact time as indicated for disinfecting solutions.   Past Medical History-  Patient Active Problem List   Diagnosis Date Noted  . Afib ([place]) 07/06/2018    Priority: High  . 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 PAD (peripheral artery disease) ([place]) 01/08/2016    Priority: High  . Diastolic CHF ([place]) 65/46/5035    Priority: High  . CKD (chronic kidney disease), stage IV ([place]) 01/27/2014    Priority: High  . Carotid artery stenosis s/p L carotid endarterectomy 01/12/2012    Priority: High  . History of CVA (cerebrovascular accident) 11/17/2011    Priority: High  . Chronic pain syndrome 12/23/2009    Priority: High  . Constipation 09/19/2017    Priority: Medium  . Personal history of colonic polyps 05/15/2013    Priority: Medium  . Essential tremor 10/05/2009    Priority: Medium  . UNSPECIFIED ANEMIA 12/05/2008    Priority: Medium  . BPH (benign prostatic hyperplasia) 06/04/2007    Priority: Medium  . Fasting hyperglycemia 02/05/2007    Priority: Medium  . Hyperlipidemia 01/03/2007    Priority: Medium  . Essential hypertension 01/03/2007    Priority: Medium  . Hemiparesis ([place]) 04/16/2019    Priority: Low  . CAP (community acquired pneumonia) 03/17/2015    Priority: Low  . Family history of malignant neoplasm of gastrointestinal tract 05/15/2013    Priority: Low  . Inguinal hernia 05/01/2012    Priority: Low  . BURSITIS, HIP 12/21/2009    Priority: Low  . ACTINIC KERATOSIS, EAR, LEFT 03/09/2009     Priority: Low  . 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 Greater trochanteric bursitis of both hips 02/26/2019  . Degenerative arthritis of knee, bilateral 01/14/2019  . SIRS (systemic inflammatory response syndrome) ([place]) 01/08/2019  . Chest pain 01/08/2019  . Atelectasis of left lung 10/30/2018  . Lumbar spondylosis 03/17/2017    Medications- reviewed and updated Current Outpatient Medications  Medication Sig Dispense Refill  . albuterol (VENTOLIN HFA) 108 (90 Base) MCG/ACT inhaler Inhale 2 puffs into the lungs every 12 (twelve) hours as needed for wheezing or shortness of breath. 1 each 6  . aspirin EC 81 MG EC tablet Take 1 tablet (81 mg total) by mouth daily.    Marland Kitchen atorvastatin (LIPITOR) 20 MG tablet TAKE 1 TABLET EVERY DAY 90 tablet 1  . BIOTIN PO Take 1 tablet by mouth daily.    . Cholecalciferol (VITAMIN D-3) 1000 UNITS CAPS Take 1,000 Units by mouth daily.     . diazepam (VALIUM) 5 MG tablet TAKE 1/2 TO 1 TABLET q8 hours PRN pain sleep/muscle spasm-only if wife is around+can watch you closely when taking due to sedation risk 90 tablet 0  . fenofibrate 160 MG tablet Take 1 tablet (160 mg total) by mouth daily. 90 tablet 3  . furosemide (LASIX) 20 MG tablet Take 1 tablet (20 mg total) by mouth daily. 90 tablet 1  .  gabapentin (NEURONTIN) 400 MG capsule Take 1 capsule (400 mg total) by mouth 2 (two) times daily. 400 mg twice a day 180 capsule 3  . linaclotide (LINZESS) 145 MCG CAPS capsule TAKE 1 CAPSULE BY MOUTH ONCE DAILY BEFORE BREAKFAST 90 capsule 3  . Melatonin 10 MG TABS Take 10 mg by mouth at bedtime.     . metoprolol tartrate (LOPRESSOR) 25 MG tablet Take 1 tablet (25 mg total) by mouth 2 (two) times daily. 180 tablet 1  . oxyCODONE-acetaminophen (PERCOCET) 7.5-325 MG tablet Take 1 tablet by mouth every 8 (eight) hours as needed for severe pain. 70 tablet 0  . tamsulosin (FLOMAX) 0.4 MG CAPS  capsule Take 1 capsule (0.4 mg total) by mouth daily. 90 capsule 3  . tiZANidine (ZANAFLEX) 4 MG tablet TAKE 1 TABLET EVERY 6 HOURS AS NEEDED FOR MUSCLE SPASMS 90 tablet 1  . vitamin B-12 (CYANOCOBALAMIN) 1000 MCG tablet Take 1,000 mcg by mouth daily.    Marland Kitchen amLODipine (NORVASC) 10 MG tablet TAKE 1 TABLET EVERY DAY (Patient not taking: Reported on 07/08/2019) 90 tablet 1   No current facility-administered medications for this visit.     Objective:  BP (!) 158/86   Pulse 76   Temp 98.7 F (37.1 C) (Temporal)   Ht 5' 7.5" (1.715 m)   Wt 187 lb (84.8 kg)   SpO2 95%   BMI 28.86 kg/m  Gen: NAD, resting comfortably CV: RRR no murmurs rubs or gallops Lungs: CTAB no crackles, wheeze, rhonchi Ext: no edema Skin: warm, dry Neuro: CN II-XII intact, sensation and reflexes normal throughout, 5/5 muscle strength in bilateral upper and lower extremities. Normal finger to nose. Normal rapid alternating movements. No pronator drift. Normal romberg. Normal gait.      Assessment and Plan   # Hypertension  #Dizzy/cold spell S: Per phone call from 07/02/2019:" Saturday night after he had taken all meds he had episode where he went pale and turned cold He was mumbling something and telling wife he did not feel right. He was like that until for about 10 minutes. When he opened eyes he was very sleepy and she had to help him in the bed. He then slept for 5 hrs. He was very weak the next day but felt good but was back to normal the next day.   Wife states that she did not call 911 when it happened because he refuses to go to back to hospital. He has not had issues after this. She wanted to know if he needs to be seen of if you have any recommendations "  Today, Wife states he went to went to sit down after the meal and wasn't acting like himself- he seemed to be trying to talk/moving mouth but not saying anything. He was extremely pale like all the blood had gone out of his face- he felt very cold to touch. .  He states he knew he was awake but didn't feel like he was in his house. No shaking or rhythmic movement like a seizure. Episode 10 minutes. He came out of it and was confused and off balance but he firmly refused ER.  He states it felt like an out of body experience. He slept for 5 hours and wife essentially watched him for that entire time   They did not have a chance to check his blood pressure or heart rate when he had this episode.  Was after meals so did not suspect a low blood sugar-we did give  him a meter today so they can check blood sugar if this happened again.Tested negative for covid. Tested negative for covid. Tested negative for covid on July 03, 2019. Marland Kitchen   Patient and family were concerned it might be related to blood pressure. Patient has not been taking Amlodipine 10 mg since the episode and has brought list of home readings. He denies any repeat in the symptoms after last episode. We have checked home cuff in past per patient and reports we validated it. Home averages 120-130s to 70s to 80s. Did have some odd outliers in the last week up to 170 x2 but also had another reading down to 96 systolic.  Today blood pressure is elevated on Lasix 20 mg, metoprolol 25 mg twice a day  Patient does have a history of post op atrial fibrillation.  Dr. Harrington Challenger took him off anticoagulant July 2020 it appears-I suspect fall risk played a role.  From that note "Atrial fibrillation   Post op   CLinically does  Not appear to be in atrial fibrillation  I would recomm stopping coumadin  COntinue ASA  FOllow"  Appears patient may have had recurrent episode of atrial fibrillation August 2020.  A/P: 79 year old male with 10-minute episode of disorientation/feeling very cold to touch per wife of unclear etiology. -  I do not strongly suspect seizure but it is possible.  If recurrent issues would consider neurological consult though  -could have been from low blood pressure potentially-reviewed patient's home  blood pressures and even without amlodipine still have blood pressure as low as 96/64 but had significant variability with numbers up to 170s as well-average has been within range less than 140/90 though-for now we will continue without amlodipine 10 mg but may need to restart lower dose-I would like to target systolics at least between 100-155.  We will continue Lasix 20 mg and metoprolol 25 mg twice a day. Continue to hold amlodipine for now-  Leave amlodipine on medication list though -TIA certainly possible.  Previously it was thought that patient's atrial fibrillation was only postop-reviewing hospitalization from August 2020 appears may also have had recurrence at other times.  I recommended close follow-up with cardiology-they may want to reinstitute his Coumadin (options more limited with Cr around 2) consider cardiac monitoring-may have had an arrhythmia during this time as well.  Doubt heart attack -At patient's age and with history of chronic pain requiring oxycodone there is also risk of polypharmacy-if has recurrent episodes would need to heavily consider stopping tizanidine, reducing gabapentin, stopping Valium.  Particularly if restarting Coumadin would need to reduce anything that would increase fall risk -They agree to seek care immediately if new or worsening symptoms or recurrent symptoms -Asked for more vitals if has another episode plus blood sugar though I doubt low blood sugar considering was just after a meal.  No signs of swallowing issues -Close follow-up in 1 month or sooner if recurrent issues  #Lung opacity-lung CT ordered to follow-up on chest x-ray abnormality  #History of mild anemia-he submitted Hemoccult today-update CBC  Recommended follow up: Return in about 1 month (around 08/05/2019) for follow up- or sooner if needed. Future Appointments  Date Time Provider [place]  07/18/2019  9:00 AM Bayard Hugger, NP [facility]  07/30/2019  1:30 PM Lyndal Pulley,  DO [facility] None    Lab/Order associations:   ICD-10-CM   1. Fatigue, unspecified type  R53.83 CANCELED: Fecal occult blood, imunochemical    CANCELED: Fecal occult blood,  imunochemical  2. Opacity of lung on imaging study  R91.8 CT Chest Wo Contrast  3. Anemia, unspecified type  D64.9 Fecal occult blood, imunochemical  4. Mixed hyperlipidemia  E78.2 CBC with Differential/Platelet    Comprehensive metabolic panel    Lipid panel  5. Fasting hyperglycemia  R73.01 Hemoglobin A1c   Time Spent: 60 minutes of total time (2:05 PM- 2:48 PM, 8:28 PM - 8:45 PM) was spent on the date of the encounter performing the following actions: chart review prior to seeing the patient, obtaining history, performing a medically necessary exam, counseling on the treatment plan, placing orders, and documenting in our EHR.   Return precautions advised.  Garret Reddish, MD

## 2019-07-09 ENCOUNTER — Other Ambulatory Visit: Payer: Self-pay

## 2019-07-09 ENCOUNTER — Telehealth: Payer: Self-pay

## 2019-07-09 DIAGNOSIS — K921 Melena: Secondary | ICD-10-CM

## 2019-07-09 DIAGNOSIS — E875 Hyperkalemia: Secondary | ICD-10-CM

## 2019-07-09 LAB — FECAL OCCULT BLOOD, IMMUNOCHEMICAL: Fecal Occult Bld: POSITIVE — AB

## 2019-07-09 MED ORDER — ROSUVASTATIN CALCIUM 20 MG PO TABS: 20.0000 mg | ORAL_TABLET | Freq: Every day | ORAL | 3 refills | Status: DC

## 2019-07-09 NOTE — Telephone Encounter (Signed)
Called and spoke with pt and reviewed lab results and placed GI referral.

## 2019-07-09 NOTE — Telephone Encounter (Signed)
Please tell patient about result. I would like to send him to GI but I want to make sure he doesn't have anymore episodes like he recently had (confusion/cold to touch) and also want him to see cardiology before GI visit

## 2019-07-09 NOTE — Telephone Encounter (Signed)
Lab reported positive I fob for patient.

## 2019-07-10 NOTE — Progress Notes (Signed)
Cardiology Office Note:    Date:  07/11/2019   ID:  Kevin [name], DOB [birth year], MRN [id number]  PCP:  Marin Olp, MD  Cardiologist:  Dorris Carnes, MD  Referring MD: Marin Olp, MD   Chief Complaint  Patient presents with   Atrial Fibrillation    History of Present Illness:    Kevin [name] is a 79 y.o. male with a past medical history significant for CAD (s/p CABG 2020), hypertension, hyperlipidemia, bradycardia, carotid artery disease s/p bilateral CEA, TIA x2 previously on Plavix, RA, CKD stage III.  The patient was admitted to the hospital in 05/2018 with chest pain and found to have multivessel CAD.  He underwent CABG x4 (LIMA to LAD; SVG to D1, SVG to OM2; SVG to PDA) on 06/27/2018. EF was 60-65% by echo. He had postop atrial fibrillation and was placed on amiodarone and anticoagulation which have both been discontinued since then.  The patient was admitted to the hospital in August 2020 with community-acquired pneumonia and treated with antibiotics.  He was last seen by Dr. Harrington Challenger via telemedicine on 02/07/2019 at which time he was having some loss of balance early in the morning.  Energy was down on some days and he felt fatigued most of the time.  He had no symptoms suggesting angina.  Dr. Harrington Challenger was not convinced that his fatigue was cardiac related, possibly from poor quality of sleep.  The patient is here today with his wife because he had an episode 2 weeks ago with feeling of altered consciousness, confusion. His wife noted that he was very cold to the touch. He was mumbling and disoriented. He was weak and could not stand. The episode lasted about 10-15 minutes per his wife. The patient denies any chest discomfort, shortness of breath or any other complaints. He was fully recovered the next morning and has had no further such issues. The symptoms were not like his prior TIAs. He did not have any numbness or unilateral weakness.   He denies orthopnea, PND, edema. He  does not feel palpitations but he has noted some irregular beats on his BP monitor occasionally.   His wife notes that he has mild DOE with walking up hill or up stairs.   His PCP was concerned that he may be having afib again.   HR was 110 one day with high BP per his home monitor.   His BP has been up and down 178/92 to low of 103/60.    Cardiac studies   Echocardiogram 06/20/2018 Study Conclusions   - Left ventricle: The cavity size was normal. There was mild  concentric hypertrophy. Systolic function was normal. The  estimated ejection fraction was in the range of 60% to 65%. Wall  motion was normal; there were no regional wall motion  abnormalities. Doppler parameters are consistent with abnormal  left ventricular relaxation (grade 1 diastolic dysfunction).  Doppler parameters are consistent with high ventricular filling  pressure.  - Aortic valve: Transvalvular velocity was within the normal range.  There was no stenosis. There was no regurgitation. Valve area  (VTI): 2.17 cm^2. Valve area (Vmean): 2.45 cm^2.  - Mitral valve: Mildly calcified annulus. Transvalvular velocity  was within the normal range. There was no evidence for stenosis.  There was no regurgitation.  - Left atrium: The atrium was mildly dilated.  - Right ventricle: The cavity size was normal. Wall thickness was  normal. Systolic function was normal.  - Tricuspid valve: There was trivial  regurgitation.  - Pulmonary arteries: Systolic pressure was within the normal  range. PA peak pressure: 24 mm Hg (S).   Past Medical History:  Diagnosis Date   Arthritis    DDD- Lower Back   Bradycardia    CAP (community acquired pneumonia) 03/17/2015   "THAT'S WHAT THEY ARE THINKING; THEY ARE NOT SURE"   Carotid artery occlusion    Chest pain 06/19/2018   Chronic lower back pain    DDD   CKD (chronic kidney disease)    Dr. 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      Muscle spasm    takes Zanaflex daily as needed   Peripheral edema    takes Furosemide daily   Pneumonia    hx of    Rheumatic fever 1946   Rheumatoid arthritis ([place])    Short-term memory loss    minimal   Shortness of breath dyspnea    with exertion but lasts very short period of time   TIA (transient ischemic attack)    x 2;takes Plavix daily   Urinary frequency    takes Flomax daily   Urinary urgency     Past Surgical History:  Procedure Laterality Date   ABDOMINAL AORTIC ANEURYSM REPAIR  2010   CAROTID ENDARTERECTOMY Left 09/20/2004   CATARACT EXTRACTION, BILATERAL     2016-2017   COLONOSCOPY     CORONARY ARTERY BYPASS GRAFT N/A 06/27/2018   Procedure: CORONARY ARTERY BYPASS GRAFTING (CABG), ON PUMP, TIMES FOUR, USING LEFT INTERNAL MAMMARY ARTERY AND ENDOSCOPICALLY HARVESTED LEFT GREATER SAPHENOUS VEIN;  Surgeon: Melrose Nakayama, MD;  Location: [facility];  Service: Open Heart Surgery;  Laterality: N/A;   ENDARTERECTOMY Right 10/12/2015   Procedure: RIGHT CAROTID ENDARTERECTOMY WITH PATCH ANGIOPLASTY;  Surgeon: Elam Dutch, MD;  Location: [facility];  Service: Vascular;  Laterality: Right;   INGUINAL HERNIA REPAIR Left 02/05/2013   Procedure: HERNIA REPAIR INGUINAL ADULT;  Surgeon: Joyice Faster. Cornett, MD;  Location: [facility];  Service: General;  Laterality: Left;   INGUINAL HERNIA REPAIR Left     INSERTION OF MESH Left 02/05/2013   Procedure: INSERTION OF MESH;  Surgeon: Joyice Faster. Cornett, MD;  Location: [facility];  Service: General;  Laterality: Left;   LEFT HEART CATH AND CORONARY ANGIOGRAPHY N/A 06/21/2018   Procedure: LEFT HEART CATH AND CORONARY ANGIOGRAPHY;  Surgeon: Jettie Booze, MD;  Location: [facility];  Service: Cardiovascular;  Laterality: N/A;   SHOULDER ARTHROSCOPY WITH ROTATOR CUFF REPAIR AND SUBACROMIAL DECOMPRESSION Left 05/01/2014   Procedure: LEFT ARTHROSCOPY SHOULDER SUBACROMIAL DECOMPRESSION,DISTAL CLAVICAL RESECTION AND ROTATOR CUFF REPAIR;  Surgeon: Marin Shutter, MD;  Location: [facility];  Service: Orthopedics;  Laterality: Left;   TEE WITHOUT CARDIOVERSION N/A 06/27/2018   Procedure: TRANSESOPHAGEAL ECHOCARDIOGRAM (TEE);  Surgeon: Melrose Nakayama, MD;  Location: [facility];  Service: Open Heart Surgery;  Laterality: N/A;   TESTICLAR CYST EXCISION Left    TONSILLECTOMY     VASECTOMY      Current Medications: Current Meds  Medication Sig   albuterol (VENTOLIN HFA) 108 (90 Base) MCG/ACT inhaler Inhale 2 puffs into the lungs every 12 (twelve) hours as needed for wheezing or shortness of breath.   aspirin EC 81 MG EC tablet Take 1 tablet (81 mg total) by mouth daily.   atorvastatin (LIPITOR) 20 MG tablet TAKE 1 TABLET EVERY DAY   BIOTIN PO Take 1 tablet by mouth daily.   Cholecalciferol (VITAMIN D-3) 1000 UNITS CAPS Take 1,000 Units by mouth daily.    diazepam (VALIUM) 5 MG tablet TAKE 1/2 TO 1 TABLET q8 hours PRN pain sleep/muscle spasm-only if wife is around+can watch you closely when taking due to sedation risk   fenofibrate 160 MG tablet Take 1 tablet (160 mg total) by mouth daily.   furosemide (LASIX) 20 MG tablet Take 1 tablet (20 mg total) by mouth daily.   gabapentin (NEURONTIN) 400 MG capsule Take 1 capsule (400 mg total) by mouth 2 (two) times daily. 400 mg twice a day   linaclotide (LINZESS) 145 MCG CAPS capsule  TAKE 1 CAPSULE BY MOUTH ONCE DAILY BEFORE BREAKFAST   Melatonin 10 MG TABS Take 10 mg by mouth at bedtime.    metoprolol tartrate (LOPRESSOR) 25 MG tablet Take 1 tablet (25 mg total) by mouth 2 (two) times daily.   oxyCODONE-acetaminophen (PERCOCET) 7.5-325 MG tablet Take 1 tablet by mouth every 8 (eight) hours as needed for severe pain.   rosuvastatin (CRESTOR) 20 MG tablet Take 1 tablet (20 mg total) by mouth daily.   tamsulosin (FLOMAX) 0.4 MG CAPS capsule Take 1 capsule (0.4 mg total) by mouth daily.   tiZANidine (ZANAFLEX) 4 MG tablet TAKE 1 TABLET EVERY 6 HOURS AS NEEDED FOR MUSCLE SPASMS   vitamin B-12 (CYANOCOBALAMIN) 1000 MCG tablet Take 1,000 mcg by mouth daily.     Allergies:   Patient has no known allergies.   Social History   Socioeconomic History   Marital status: Married    Spouse name: Not on file   Number of children: Not on file   Years of education: Not on file   Highest education level: Not on file  Occupational History   Occupation: retired    Comment: maintenance; electrician  Tobacco Use   Smoking status: Former Smoker    Packs/day: 2.00    Years: 28.00    Pack years: 56.00    Types: Cigarettes    Quit date: 05/31/1987    Years since quitting: 32.1   Smokeless tobacco: Never Used   Tobacco comment: quit smoking "in my 49's"  Substance and Sexual Activity   Alcohol use: Yes    Alcohol/week: 0.0 standard drinks    Comment: rare, mikes hard lemonade   Drug use: No    Types: Marijuana    Comment: hx of-4-5 yrs ago   Sexual activity: Not Currently  Other Topics Concern   Not on file  Social History Narrative   Married 33 years in 2015. Lived together for 12 years. 2 boys from first wife. 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       Hobbies: yardwork, [place] barn, woodwork, Orthoptist   Social  Determinants of  Health   Financial Resource Strain:    Difficulty of Paying Living Expenses: Not on file  Food Insecurity:    Worried About [place] in the Last Year: Not on file   Ran Out of Food in the Last Year: Not on file  Transportation Needs:    Lack of Transportation (Medical): Not on file   Lack of Transportation (Non-Medical): Not on file  Physical Activity:    Days of Exercise per Week: Not on file   Minutes of Exercise per Session: Not on file  Stress:    Feeling of Stress : Not on file  Social Connections:    Frequency of Communication with Friends and Family: Not on file   Frequency of Social Gatherings with Friends and Family: Not on file   Attends Religious Services: Not on file   Active Member of Clubs or Organizations: Not on file   Attends Archivist Meetings: Not on file   Marital Status: Not on file     Family History: The patient's family history includes Cancer in his father and mother; Colon cancer in his mother; Dementia in his father; Parkinsonism in his father. ROS:   Please see the history of present illness.     All other systems reviewed and are negative.   EKG:  EKG is ordered today.  The ekg ordered today demonstrates NSR, 63 bpm.   Recent Labs: 04/16/2019: Magnesium 2.0; TSH 2.42 07/08/2019: ALT 21; Hemoglobin 13.0; Platelets 219.0 07/11/2019: BUN 25; Creatinine, Ser 2.29; Potassium 4.8; Sodium 140   Recent Lipid Panel    Component Value Date/Time   CHOL 150 07/08/2019 1442   TRIG 158.0 (H) 07/08/2019 1442   HDL 37.50 (L) 07/08/2019 1442   CHOLHDL 4 07/08/2019 1442   VLDL 31.6 07/08/2019 1442   LDLCALC 81 07/08/2019 1442   LDLDIRECT 134.9 12/26/2012 1201    Physical Exam:    VS:  BP (!) 162/82    Pulse 63    Ht 5' 7.5" (1.715 m)    Wt 183 lb (83 kg)    SpO2 96%    BMI 28.24 kg/m     Wt Readings from Last 6 Encounters:  07/11/19 183 lb (83 kg)  07/08/19 187 lb (84.8 kg)  06/13/19 188 lb (85.3 kg)  05/21/19 188  lb (85.3 kg)  05/15/19 188 lb (85.3 kg)  05/09/19 184 lb 11.2 oz (83.8 kg)     Physical Exam  Constitutional: He is oriented to person, place, and time. No distress.  Frail, elderly male. Pt with unsteady ambulation, using a cane  HENT:  Head: Normocephalic and atraumatic.  Neck: No JVD present.  Cardiovascular: Normal rate, regular rhythm, normal heart sounds and intact distal pulses. Exam reveals no gallop and no friction rub.  No murmur heard. Pulmonary/Chest: Effort normal and breath sounds normal. No respiratory distress. He has no wheezes. He has no rales.  Abdominal: Soft. Bowel sounds are normal.  Musculoskeletal:        General: No edema.     Cervical back: Normal range of motion and neck supple.  Neurological: He is alert and oriented to person, place, and time.  Skin: Skin is warm and dry.  Psychiatric: He has a normal mood and affect. His behavior is normal. Judgment and thought content normal.  Vitals reviewed.   ASSESSMENT:    1. Paroxysmal atrial fibrillation (HCC)   2. Coronary artery disease involving native coronary artery of native heart without angina  pectoris   3. Hyperlipidemia LDL goal <70   4. Essential (primary) hypertension   5. Chronic kidney disease (CKD), stage IV (severe) ([place])   6. Hyperkalemia   7. Physical deconditioning    PLAN:    In order of problems listed above:  History of afib after CABG -Patient had afib with his CABG and was on amiodarone and coumadin which have both been discontinued. He recently had an episode of confusion and has noted occ irregular heart beat on his BP monitor as well as BP going up and down.  -Concern for possible PAF. EKG today shows sinus rhythm.  -Will order a 30 day live event monitor. If Afib is noted we would need to reinitiate anticoagulations with CHA2DS2/VAS Stroke Risk Score of 6 (CAD, HTN, Age (2), TIA(2)). Due to renal function would use coumadin as he was on previously with referral to our coumadin  clinic.  -I considered increasing his BB for rate control however his HR is only 63 so would probably not tolerate. I would have pt seen in the afib clinic if he does have afib noted for further medication management. -We will follow up after monitor results.   CAD -S/p CABG 05/2018. -On aspirin, statin and beta-blocker -No anginal symptoms  Hyperlipidemia, Goal LDL <70 -Lipid panel on 2/8 per PCP showed LDL of 81. Dr. [name] switched his atorvastatin to Crestor 20 mg daily. He's also on fenofibrate per PCP. Triglycerides 158, improved.   Hypertension -On amlodipine 10 mg, metoprolol 25 mg BID and lasix. Pt has not been taking amlodipine.  -Home BP has been up and down. Today BP is elevated. I will resume his amlodipine at a lower dose of 5 mg.   CKD stage III -Labs per PCP on 2/8 showed stable renal function with SCr 2.04, baseline. K+ was a little high 5.3 with hemolysis and there is plan to recheck BMet, which I will do today while he is here.   Anemia -CBC was stable at recent PCP visit.  -Has has an appt with GI in March.   Deconditioning -Pt is not very active. He walks slowly with a cane and is limited by back pain and arthritis in his legs. We discussed doing some leg lifts in the chair and using resistance bands that he has to try to challenge his body and vascular tone. I advised his wife to consider asking his PCP about some home PT to help improve his functional status.    Medication Adjustments/Labs and Tests Ordered: Current medicines are reviewed at length with the patient today.  Concerns regarding medicines are outlined above. Labs and tests ordered and medication changes are outlined in the patient instructions below:  Patient Instructions  Medication Instructions:  Start Amlodipine (Norvasc) 5 mg once a day   *If you need a refill on your cardiac medications before your next appointment, please call your pharmacy*  Lab Work: [facility] - Today   If you have labs  (blood work) drawn today and your tests are completely normal, you will receive your results only by:  [place] (if you have MyChart) OR  A paper copy in the mail If you have any lab test that is abnormal or we need to change your treatment, we will call you to review the results.  Testing/Procedures: Your physician has recommended that you wear an event monitor. Event monitors are medical devices that record the hearts electrical activity. Doctors most often [place] these monitors to diagnose arrhythmias. Arrhythmias are problems  with the speed or rhythm of the heartbeat. The monitor is a small, portable device. You can wear one while you do your normal daily activities. This is usually used to diagnose what is causing palpitations/syncope (passing out).    Follow-Up: Follow up with Dr. Harrington Challenger or someone on care team after you are done wearing your monitor  Other Instructions  Ambulatory Cardiac Monitoring An ambulatory cardiac monitor is a small recording device that is used to detect abnormal heart rhythms (arrhythmias). Most monitors are connected by wires to flat, sticky disks (electrodes) that are then attached to your chest. You may need to wear a monitor if you have had symptoms such as:  Fast heartbeats (palpitations).  Dizziness.  Fainting or light-headedness.  Unexplained weakness.  Shortness of breath. There are several types of monitors. Some common monitors include:  Holter monitor. This records your heart rhythm continuously, usually for 24-48 hours.  Event (episodic) monitor. This monitor has a symptoms button, and when pushed, it will begin recording. You need to activate this monitor to record when you have a heart-related symptom.  Automatic detection monitor. This monitor will begin recording when it detects an abnormal heartbeat. What are the risks? Generally, these devices are safe to use. However, it is possible that the skin under the electrodes will  become irritated. How to prepare for monitoring Your health care provider will prepare your chest for the electrode placement and show you how to use the monitor.  Do not apply lotions to your chest before monitoring.  Follow directions on how to care for the monitor, and how to return the monitor when the testing period is complete. How to use your cardiac monitor  Follow directions about how long to wear the monitor, and if you can take the monitor off in order to shower or bathe. ? Do not let the monitor get wet. ? Do not bathe, swim, or use a hot tub while wearing the monitor.  Keep your skin clean. Do not put body lotion or moisturizer on your chest.  Change the electrodes as told by your health care provider, or any time they stop sticking to your skin. You may need to use medical tape to keep them on.  Try to put the electrodes in slightly different places on your chest to help prevent skin irritation. Follow directions from your health care provider about where to place the electrodes.  Make sure the monitor is safely clipped to your clothing or in a location close to your body as recommended by your health care provider.  If your monitor has a symptoms button, press the button to mark an event as soon as you feel a heart-related symptom, such as: ? Dizziness. ? Weakness. ? Light-headedness. ? Palpitations. ? Thumping or pounding in your chest. ? Shortness of breath. ? Unexplained weakness.  Keep a diary of your activities, such as walking, doing chores, and taking medicine. It is very important to note what you were doing when you pushed the button to record your symptoms. This will help your health care provider determine what might be contributing to your symptoms.  Send the recorded information as recommended by your health care provider. It may take some time for your health care provider to process the results.  Change the batteries as told by your health care  provider.  Keep electronic devices away from your monitor. These include: ? Tablets. ? MP3 players. ? Cell phones.  While wearing your monitor you should avoid: ?  Electric blankets. ? Armed forces operational officer. ? Electric toothbrushes. ? Microwave ovens. ? Magnets. ? Metal detectors. Get help right away if:  You have chest pain.  You have shortness of breath or extreme difficulty breathing.  You develop a very fast heartbeat that does not get better.  You develop dizziness that does not go away.  You faint or constantly feel like you are about to faint. Summary  An ambulatory cardiac monitor is a small recording device that is used to detect abnormal heart rhythms (arrhythmias).  Make sure you understand how to send the information from the monitor to your health care provider.  It is important to press the button on the monitor when you have any heart-related symptoms.  Keep a diary of your activities, such as walking, doing chores, and taking medicine. It is very important to note what you were doing when you pushed the button to record your symptoms. This will help your health care provider learn what might be causing your symptoms. This information is not intended to replace advice given to you by your health care provider. Make sure you discuss any questions you have with your health care provider. Document Revised: 04/28/2017 Document Reviewed: 04/30/2016 Elsevier Patient Education  2020 [place], Daune Perch, NP  07/11/2019 5:00 PM    [place]

## 2019-07-11 ENCOUNTER — Ambulatory Visit: Payer: Medicare HMO | Admitting: Cardiology

## 2019-07-11 ENCOUNTER — Other Ambulatory Visit: Payer: Self-pay

## 2019-07-11 ENCOUNTER — Encounter: Payer: Self-pay | Admitting: Cardiology

## 2019-07-11 ENCOUNTER — Telehealth: Payer: Self-pay | Admitting: Radiology

## 2019-07-11 VITALS — BP 162/82 | HR 63 | Ht 67.5 in | Wt 183.0 lb

## 2019-07-11 DIAGNOSIS — I2510 Atherosclerotic heart disease of native coronary artery without angina pectoris: Secondary | ICD-10-CM

## 2019-07-11 DIAGNOSIS — E875 Hyperkalemia: Secondary | ICD-10-CM

## 2019-07-11 DIAGNOSIS — I480 Paroxysmal atrial fibrillation: Secondary | ICD-10-CM

## 2019-07-11 DIAGNOSIS — E785 Hyperlipidemia, unspecified: Secondary | ICD-10-CM | POA: Diagnosis not present

## 2019-07-11 DIAGNOSIS — R5381 Other malaise: Secondary | ICD-10-CM

## 2019-07-11 DIAGNOSIS — I10 Essential (primary) hypertension: Secondary | ICD-10-CM

## 2019-07-11 DIAGNOSIS — N184 Chronic kidney disease, stage 4 (severe): Secondary | ICD-10-CM | POA: Diagnosis not present

## 2019-07-11 LAB — BASIC METABOLIC PANEL
BUN/Creatinine Ratio: 11 (ref 10–24)
BUN: 25 mg/dL (ref 8–27)
CO2: 24 mmol/L (ref 20–29)
Calcium: 9.3 mg/dL (ref 8.6–10.2)
Chloride: 100 mmol/L (ref 96–106)
Creatinine, Ser: 2.29 mg/dL — ABNORMAL HIGH (ref 0.76–1.27)
GFR calc Af Amer: 30 mL/min/{1.73_m2} — ABNORMAL LOW (ref >59)
GFR calc non Af Amer: 26 mL/min/{1.73_m2} — ABNORMAL LOW (ref >59)
Glucose: 101 mg/dL — ABNORMAL HIGH (ref 65–99)
Potassium: 4.8 mmol/L (ref 3.5–5.2)
Sodium: 140 mmol/L (ref 134–144)

## 2019-07-11 MED ORDER — AMLODIPINE BESYLATE 5 MG PO TABS: 5.0000 mg | ORAL_TABLET | Freq: Every day | ORAL | 3 refills | Status: DC

## 2019-07-11 NOTE — Telephone Encounter (Signed)
Enrolled patient for a 30 day [employer] Event Monitor to be mailed to patients home  

## 2019-07-11 NOTE — Patient Instructions (Signed)
Medication Instructions:  Start Amlodipine (Norvasc) 5 mg once a day   *If you need a refill on your cardiac medications before your next appointment, please call your pharmacy*  Lab Work: [facility] - Today   If you have labs (blood work) drawn today and your tests are completely normal, you will receive your results only by: Marland Kitchen MyChart Message (if you have MyChart) OR . A paper copy in the mail If you have any lab test that is abnormal or we need to change your treatment, we will call you to review the results.  Testing/Procedures: Your physician has recommended that you wear an event monitor. Event monitors are medical devices that record the heart's electrical activity. Doctors most often [place] these monitors to diagnose arrhythmias. Arrhythmias are problems with the speed or rhythm of the heartbeat. The monitor is a small, portable device. You can wear one while you do your normal daily activities. This is usually used to diagnose what is causing palpitations/syncope (passing out).    Follow-Up: Follow up with Dr. Harrington Challenger or someone on care team after you are done wearing your monitor  Other Instructions  Ambulatory Cardiac Monitoring An ambulatory cardiac monitor is a small recording device that is used to detect abnormal heart rhythms (arrhythmias). Most monitors are connected by wires to flat, sticky disks (electrodes) that are then attached to your chest. You may need to wear a monitor if you have had symptoms such as:  Fast heartbeats (palpitations).  Dizziness.  Fainting or light-headedness.  Unexplained weakness.  Shortness of breath. There are several types of monitors. Some common monitors include:  Holter monitor. This records your heart rhythm continuously, usually for 24-48 hours.  Event (episodic) monitor. This monitor has a symptoms button, and when pushed, it will begin recording. You need to activate this monitor to record when you have a heart-related  symptom.  Automatic detection monitor. This monitor will begin recording when it detects an abnormal heartbeat. What are the risks? Generally, these devices are safe to use. However, it is possible that the skin under the electrodes will become irritated. How to prepare for monitoring Your health care provider will prepare your chest for the electrode placement and show you how to use the monitor.  Do not apply lotions to your chest before monitoring.  Follow directions on how to care for the monitor, and how to return the monitor when the testing period is complete. How to use your cardiac monitor  Follow directions about how long to wear the monitor, and if you can take the monitor off in order to shower or bathe. ? Do not let the monitor get wet. ? Do not bathe, swim, or use a hot tub while wearing the monitor.  Keep your skin clean. Do not put body lotion or moisturizer on your chest.  Change the electrodes as told by your health care provider, or any time they stop sticking to your skin. You may need to use medical tape to keep them on.  Try to put the electrodes in slightly different places on your chest to help prevent skin irritation. Follow directions from your health care provider about where to place the electrodes.  Make sure the monitor is safely clipped to your clothing or in a location close to your body as recommended by your health care provider.  If your monitor has a symptoms button, press the button to mark an event as soon as you feel a heart-related symptom, such as: ? Dizziness. ?  Weakness. ? Light-headedness. ? Palpitations. ? Thumping or pounding in your chest. ? Shortness of breath. ? Unexplained weakness.  Keep a diary of your activities, such as walking, doing chores, and taking medicine. It is very important to note what you were doing when you pushed the button to record your symptoms. This will help your health care provider determine what might be  contributing to your symptoms.  Send the recorded information as recommended by your health care provider. It may take some time for your health care provider to process the results.  Change the batteries as told by your health care provider.  Keep electronic devices away from your monitor. These include: ? Tablets. ? MP3 players. ? Cell phones.  While wearing your monitor you should avoid: ? Electric blankets. ? Armed forces operational officer. ? Electric toothbrushes. ? Microwave ovens. ? Magnets. ? Metal detectors. Get help right away if:  You have chest pain.  You have shortness of breath or extreme difficulty breathing.  You develop a very fast heartbeat that does not get better.  You develop dizziness that does not go away.  You faint or constantly feel like you are about to faint. Summary  An ambulatory cardiac monitor is a small recording device that is used to detect abnormal heart rhythms (arrhythmias).  Make sure you understand how to send the information from the monitor to your health care provider.  It is important to press the button on the monitor when you have any heart-related symptoms.  Keep a diary of your activities, such as walking, doing chores, and taking medicine. It is very important to note what you were doing when you pushed the button to record your symptoms. This will help your health care provider learn what might be causing your symptoms. This information is not intended to replace advice given to you by your health care provider. Make sure you discuss any questions you have with your health care provider. Document Revised: 04/28/2017 Document Reviewed: 04/30/2016 Elsevier Patient Education  2020 [employer].

## 2019-07-15 ENCOUNTER — Other Ambulatory Visit: Payer: Self-pay | Admitting: Family Medicine

## 2019-07-16 ENCOUNTER — Other Ambulatory Visit (INDEPENDENT_AMBULATORY_CARE_PROVIDER_SITE_OTHER): Payer: Medicare HMO

## 2019-07-16 ENCOUNTER — Other Ambulatory Visit: Payer: Self-pay

## 2019-07-16 DIAGNOSIS — E875 Hyperkalemia: Secondary | ICD-10-CM | POA: Diagnosis not present

## 2019-07-16 LAB — BASIC METABOLIC PANEL
BUN: 26 mg/dL — ABNORMAL HIGH (ref 6–23)
CO2: 27 mEq/L (ref 19–32)
Calcium: 9.1 mg/dL (ref 8.4–10.5)
Chloride: 102 mEq/L (ref 96–112)
Creatinine, Ser: 2.28 mg/dL — ABNORMAL HIGH (ref 0.40–1.50)
GFR: 27.73 mL/min — ABNORMAL LOW (ref >60.00)
Glucose, Bld: 109 mg/dL — ABNORMAL HIGH (ref 70–99)
Potassium: 4.3 mEq/L (ref 3.5–5.1)
Sodium: 137 mEq/L (ref 135–145)

## 2019-07-17 ENCOUNTER — Telehealth: Payer: Self-pay | Admitting: Registered Nurse

## 2019-07-17 MED ORDER — OXYCODONE-ACETAMINOPHEN 7.5-325 MG PO TABS: 1.0000 | ORAL_TABLET | Freq: Three times a day (TID) | ORAL | 0 refills | Status: DC | PRN
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
## 2019-07-18 ENCOUNTER — Other Ambulatory Visit: Payer: Self-pay

## 2019-07-18 ENCOUNTER — Encounter: Payer: Medicare HMO | Attending: Physical Medicine & Rehabilitation | Admitting: Registered Nurse

## 2019-07-18 ENCOUNTER — Encounter: Payer: Self-pay | Admitting: Registered Nurse

## 2019-07-18 VITALS — BP 136/74 | HR 55 | Ht 67.0 in | Wt 183.0 lb

## 2019-07-18 DIAGNOSIS — M47816 Spondylosis without myelopathy or radiculopathy, lumbar region: Secondary | ICD-10-CM

## 2019-07-18 DIAGNOSIS — Z5181 Encounter for therapeutic drug level monitoring: Secondary | ICD-10-CM | POA: Diagnosis not present

## 2019-07-18 DIAGNOSIS — Z79891 Long term (current) use of opiate analgesic: Secondary | ICD-10-CM

## 2019-07-18 DIAGNOSIS — I739 Peripheral vascular disease, unspecified: Secondary | ICD-10-CM | POA: Insufficient documentation

## 2019-07-18 DIAGNOSIS — G894 Chronic pain syndrome: Secondary | ICD-10-CM | POA: Diagnosis not present

## 2019-07-18 DIAGNOSIS — R001 Bradycardia, unspecified: Secondary | ICD-10-CM | POA: Diagnosis not present

## 2019-07-18 DIAGNOSIS — M1712 Unilateral primary osteoarthritis, left knee: Secondary | ICD-10-CM | POA: Insufficient documentation

## 2019-07-18 DIAGNOSIS — M1711 Unilateral primary osteoarthritis, right knee: Secondary | ICD-10-CM | POA: Diagnosis not present

## 2019-07-18 DIAGNOSIS — M25551 Pain in right hip: Secondary | ICD-10-CM | POA: Insufficient documentation
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
## 2019-07-26 ENCOUNTER — Ambulatory Visit: Payer: Medicare HMO | Attending: Internal Medicine

## 2019-07-26 DIAGNOSIS — Z23 Encounter for immunization: Secondary | ICD-10-CM

## 2019-07-26 NOTE — Progress Notes (Signed)
   Covid-19 Vaccination Clinic  Name:  ELL TISO    MRN: [id number] DOB: Oct 17, 1940  07/26/2019  Mr. Stickels was observed post Covid-19 immunization for 15 minutes without incidence. He was provided with Vaccine Information Sheet and instruction to access the V-Safe system.   Mr. Besson was instructed to call 911 with any severe reactions post vaccine: Marland Kitchen Difficulty breathing  . Swelling of your face and throat  . A fast heartbeat  . A bad rash all over your body  . Dizziness and weakness    Immunizations Administered    Name Date Dose VIS Date Route   Pfizer COVID-19 Vaccine 07/26/2019  2:47 PM 0.3 mL 05/10/2019 Intramuscular   Manufacturer: [employer]   Lot: [id number]   [place]: [id number]

## 2019-07-29 ENCOUNTER — Ambulatory Visit (INDEPENDENT_AMBULATORY_CARE_PROVIDER_SITE_OTHER): Payer: Medicare HMO

## 2019-07-29 DIAGNOSIS — I480 Paroxysmal atrial fibrillation: Secondary | ICD-10-CM

## 2019-07-29 NOTE — Progress Notes (Signed)
[place] [facility] [place] Phone: [phone] Subjective:   Kevin [name], am serving as a scribe for Dr. Hulan Saas. This visit occurred during the SARS-CoV-2 public health emergency.  Safety protocols were in place, including screening questions prior to the visit, additional usage of staff PPE, and extensive cleaning of exam room while observing appropriate contact time as indicated for disinfecting solutions.   I'm seeing this patient by the request  of:  Marin Olp, MD  CC: Multiple complaints  XTG:[id number]   05/21/2019 Bilateral injection given today.  Tolerated the procedure well, discussed icing regimen and home exercise, discussed which activities to do which wants to avoid.  Patient is to increase activity slowly over the course the next several days.  Follow-up again in 4 to 8 weeks  Discussed with patient about the essential tremor.  Has been on a beta-blocker, discussed the possibility of further evaluation with neurology which patient declined.  Repeat injection given today.  Discussed with patient in great length.  Due to patient's other comorbidities cyst under the skin choices avoid other things we can potentially do at this time.  Patient is going to increase activity as tolerated.  Discussed icing regimen.  Patient will follow up with me again in 10 to 12 weeks.  Update 07/30/2019 Kevin [name] is a 79 y.o. male coming in with complaint of bilateral knee and hip pain. Left knee and right hip are works than contralateral sides.   Also having bilateral heel pain over the posterior aspect of calcaneous. Painful while sleeping L>R. Pain is intermittent but recently the pain will not go away.       Past Medical History:  Diagnosis Date  . Arthritis    DDD- Lower Back  . Bradycardia   . CAP (community acquired pneumonia) 03/17/2015   "THAT'S WHAT THEY ARE THINKING; THEY ARE NOT SURE"  . Carotid artery  occlusion   . Chest pain 06/19/2018  . Chronic lower back pain    DDD  . CKD (chronic kidney disease)    Dr. Corliss Parish  . 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 Diverticulosis   . Enlarged prostate    takes Flomax daily  . History of blood transfusion    "probably when they did aortic aneurysm"  . History of colon polyps    benign  . Hyperlipidemia   . Hypertension    takes Amlodipine and Zebeta daily  . Insomnia    takes Melatonin nightly  . Joint pain   . 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     . Muscle spasm    takes Zanaflex daily as needed  . Peripheral edema    takes Furosemide daily  . Pneumonia    hx of   . Rheumatic fever 1946  . Rheumatoid arthritis ([place])   . Short-term memory loss    minimal  . Shortness of breath dyspnea    with exertion but lasts very short period of time  . TIA (transient ischemic attack)    x 2;takes Plavix daily  . Urinary frequency    takes Flomax daily  . Urinary urgency    Past Surgical History:  Procedure Laterality Date  . ABDOMINAL AORTIC ANEURYSM REPAIR  2010  .  CAROTID ENDARTERECTOMY Left 09/20/2004  . CATARACT EXTRACTION, BILATERAL     2016-2017  . COLONOSCOPY    . 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 ENDARTERECTOMY Right 10/12/2015   Procedure: RIGHT CAROTID ENDARTERECTOMY WITH PATCH ANGIOPLASTY;  Surgeon: Elam Dutch, MD;  Location: [facility];  Service: Vascular;  Laterality: Right;  . INGUINAL HERNIA REPAIR Left 02/05/2013   Procedure: HERNIA REPAIR  INGUINAL ADULT;  Surgeon: Joyice Faster. Cornett, MD;  Location: [facility];  Service: General;  Laterality: Left;  . INGUINAL HERNIA REPAIR Left   . INSERTION OF MESH Left 02/05/2013   Procedure: INSERTION OF MESH;  Surgeon: Joyice Faster. Cornett, MD;  Location: [place];  Service: General;  Laterality: Left;  . LEFT HEART CATH AND CORONARY ANGIOGRAPHY N/A 06/21/2018   Procedure: LEFT HEART CATH AND CORONARY ANGIOGRAPHY;  Surgeon: Jettie Booze, MD;  Location: [facility];  Service: Cardiovascular;  Laterality: N/A;  . SHOULDER ARTHROSCOPY WITH ROTATOR CUFF REPAIR AND SUBACROMIAL DECOMPRESSION Left 05/01/2014   Procedure: LEFT ARTHROSCOPY SHOULDER SUBACROMIAL DECOMPRESSION,DISTAL CLAVICAL RESECTION AND ROTATOR CUFF REPAIR;  Surgeon: Marin Shutter, MD;  Location: [facility];  Service: Orthopedics;  Laterality: Left;  . TEE WITHOUT CARDIOVERSION N/A 06/27/2018   Procedure: TRANSESOPHAGEAL ECHOCARDIOGRAM (TEE);  Surgeon: Melrose Nakayama, MD;  Location: [facility];  Service: Open Heart Surgery;  Laterality: N/A;  . TESTICLAR CYST EXCISION Left   . TONSILLECTOMY    . VASECTOMY     Social History   Socioeconomic History  . Marital status: Married    Spouse name: Not on file  . Number of children: Not on file  . Years of education: Not on file  . Highest education level: Not on file  Occupational History  . Occupation: retired    Comment: maintenance; electrician  Tobacco Use  . Smoking status: Former Smoker    Packs/day: 2.00    Years: 28.00    Pack years: 56.00    Types: Cigarettes    Quit date: 05/31/1987    Years since quitting: 32.1  . Smokeless tobacco: Never Used  . Tobacco comment: quit smoking "in my 40's"  Substance and Sexual Activity  . Alcohol use: Yes    Alcohol/week: 0.0 standard drinks    Comment: rare, mikes hard lemonade  . Drug use: [name]    Types: Marijuana    Comment: hx of-4-5 yrs ago  . Sexual activity: Not Currently  Other Topics  Concern  . Not on file  Social History Narrative   Married 33 years in 2015. Lived together for 12 years. 2 boys from first wife. 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       Hobbies: yardwork, [place] barn, woodwork, Orthoptist   Social Determinants of Health   Financial Resource Strain:   . Difficulty of Paying Living Expenses: Not on file  Food Insecurity:   . Worried About Charity fundraiser in the Last Year: Not on file  . Ran Out of Food in the Last Year: Not on file  Transportation Needs:   .  Lack of Transportation (Medical): Not on file  . Lack of Transportation (Non-Medical): Not on file  Physical Activity:   . Days of Exercise per Week: Not on file  . Minutes of Exercise per Session: Not on file  Stress:   . Feeling of Stress : Not on file  Social Connections:   . Frequency of Communication with Friends and Family: Not on file  . Frequency of Social Gatherings with Friends and Family: Not on file  . Attends Religious Services: Not on file  . Active Member of Clubs or Organizations: Not on file  . Attends Archivist Meetings: Not on file  . Marital Status: Not on file   [name] Known Allergies Family History  Problem Relation Age of Onset  . Parkinsonism Father   . Dementia Father   . Cancer Father        Throat  . Colon cancer Mother        died in 81s  . Cancer Mother        Colon     Current Outpatient Medications (Cardiovascular):  .  atorvastatin (LIPITOR) 20 MG tablet, TAKE 1 TABLET EVERY DAY .  fenofibrate 160 MG tablet, Take 1 tablet (160 mg total) by mouth daily. .  furosemide (LASIX) 20 MG tablet, TAKE 1 TABLET EVERY DAY .  metoprolol tartrate (LOPRESSOR) 25 MG tablet, Take 1 tablet (25 mg total) by mouth 2 (two) times daily. .  rosuvastatin (CRESTOR) 20 MG tablet, Take 1 tablet (20 mg total) by mouth daily.  Current  Outpatient Medications (Respiratory):  .  albuterol (VENTOLIN HFA) 108 (90 Base) MCG/ACT inhaler, Inhale 2 puffs into the lungs every 12 (twelve) hours as needed for wheezing or shortness of breath.  Current Outpatient Medications (Analgesics):  .  aspirin EC 81 MG EC tablet, Take 1 tablet (81 mg total) by mouth daily. Marland Kitchen  oxyCODONE-acetaminophen (PERCOCET) 7.5-325 MG tablet, Take 1 tablet by mouth every 8 (eight) hours as needed for severe pain.  Current Outpatient Medications (Hematological):  .  vitamin B-12 (CYANOCOBALAMIN) 1000 MCG tablet, Take 1,000 mcg by mouth daily. .  cilostazol (PLETAL) 50 MG tablet, Take 1 tablet (50 mg total) by mouth 2 (two) times daily.  Current Outpatient Medications (Other):  .  BIOTIN PO, Take 1 tablet by mouth daily. .  Cholecalciferol (VITAMIN D-3) 1000 UNITS CAPS, Take 1,000 Units by mouth daily.  .  diazepam (VALIUM) 5 MG tablet, TAKE 1/2 TO 1 TABLET q8 hours PRN pain sleep/muscle spasm-only if wife is around+can watch you closely when taking due to sedation risk .  gabapentin (NEURONTIN) 400 MG capsule, Take 1 capsule (400 mg total) by mouth 2 (two) times daily. 400 mg twice a day .  linaclotide (LINZESS) 145 MCG CAPS capsule, TAKE 1 CAPSULE BY MOUTH ONCE DAILY BEFORE BREAKFAST .  Melatonin 10 MG TABS, Take 10 mg by mouth at bedtime.  .  tamsulosin (FLOMAX) 0.4 MG CAPS capsule, Take 1 capsule (0.4 mg total) by mouth daily. Marland Kitchen  tiZANidine (ZANAFLEX) 4 MG tablet, TAKE 1 TABLET EVERY 6 HOURS AS NEEDED FOR MUSCLE SPASMS   Reviewed prior external information including notes and imaging from  primary care provider As well as notes that were available from care everywhere and other healthcare systems.  Past medical history, social, surgical and family history all reviewed in electronic medical record.  [name] pertanent information unless stated regarding to the chief complaint.   Review of Systems:  [name] headache, visual changes, nausea, vomiting,  diarrhea,  constipation, dizziness, abdominal pain, skin rash, fevers, chills, night sweats, weight loss, swollen lymph nodes,  chest pain, shortness of breath, mood changes. POSITIVE muscle aches body aches, joint swelling, numbness in the extremities  Objective  Blood pressure 136/86, pulse 61, height 5\' 7"  (1.702 m), weight 184 lb (83.5 kg), SpO2 92 %.   General: [name] apparent distress alert and oriented x3 mood and affect normal, dressed appropriately.  Resting tremor noted HEENT: Pupils equal, extraocular movements intact  Respiratory: Patient's speak in full sentences and does not appear short of breath  Cardiovascular: [name] lower extremity edema, non tender, [name] erythema  Skin: Warm dry intact with [name] signs of infection or rash on extremities or on axial skeleton.  Abdomen: Soft nontender  Neuro: Vascularity of the lower extremity shows 1+ dorsalis pedis and posterior tib: Absent to toes bilaterally Lymph: [name] lymphadenopathy of posterior or anterior cervical chain or axillae bilaterally.  Gait antalgic with mild broad-based gait  Back exam does have loss of lordosis.  Tender to palpation in the paraspinal musculature lumbar spine right greater than left.  MSK:  TTP GT bilaterally and decrease ROM bilaterally. Pain in the SIJ bilaterally.    Knee: bilateral valgus deformity noted. Abnormal thigh to calf ratio.  Tender to palpation over medial and PF joint line.  ROM full in flexion and extension and lower leg rotation. instability with valgus force.  painful patellar compression. Patellar glide with moderate crepitus. Patellar and quadriceps tendons unremarkable. Hamstring and quadriceps strength is normal.  After informed written and verbal consent, patient was seated on exam table. Right knee was prepped with alcohol swab and utilizing anterolateral approach, patient's right knee space was injected with 4:1  marcaine 0.5%: Kenalog 40mg /dL. Patient tolerated the procedure well without immediate  complications.  After informed written and verbal consent, patient was seated on exam table. Left knee was prepped with alcohol swab and utilizing anterolateral approach, patient's left knee space was injected with 4:1  marcaine 0.5%: Kenalog 40mg /dL. Patient tolerated the procedure well without immediate complications.  After verbal consent patient was prepped with alcohol swabs and with a 21-gauge 2 inch needle the right greater trochanteric area with a total of 3 cc of 0.5% Marcaine and 1 cc of Kenalog 40 mg/mL, [name] blood loss, postinjection instructions given  After verbal consent patient was prepped with alcohol swabs and with 21-gauge 2 inch needle injected into the left greater trochanteric area with a total of 3 cc of 0.5% Marcaine and 1 cc of Kenalog 40 mg/mL, [name] blood loss, Band-Aid placed.  Postinjection instructions given       Impression and Recommendations:     This case required medical decision making of moderate complexity. The above documentation has been reviewed and is accurate and complete Lyndal Pulley, DO       Note: This dictation was prepared with Dragon dictation along with smaller phrase technology. Any transcriptional errors that result from this process are unintentional.

## 2019-07-30 ENCOUNTER — Encounter: Payer: Self-pay | Admitting: Family Medicine

## 2019-07-30 ENCOUNTER — Ambulatory Visit (INDEPENDENT_AMBULATORY_CARE_PROVIDER_SITE_OTHER): Payer: Medicare HMO

## 2019-07-30 ENCOUNTER — Other Ambulatory Visit: Payer: Self-pay

## 2019-07-30 ENCOUNTER — Ambulatory Visit: Payer: Medicare HMO | Admitting: Family Medicine

## 2019-07-30 VITALS — BP 136/86 | HR 61 | Ht 67.0 in | Wt 184.0 lb

## 2019-07-30 DIAGNOSIS — M7062 Trochanteric bursitis, left hip: Secondary | ICD-10-CM

## 2019-07-30 DIAGNOSIS — I739 Peripheral vascular disease, unspecified: Secondary | ICD-10-CM | POA: Diagnosis not present

## 2019-07-30 DIAGNOSIS — M170 Bilateral primary osteoarthritis of knee: Secondary | ICD-10-CM | POA: Diagnosis not present

## 2019-07-30 DIAGNOSIS — M545 Low back pain, unspecified: Secondary | ICD-10-CM

## 2019-07-30 DIAGNOSIS — M47816 Spondylosis without myelopathy or radiculopathy, lumbar region: Secondary | ICD-10-CM

## 2019-07-30 DIAGNOSIS — M7061 Trochanteric bursitis, right hip: Secondary | ICD-10-CM | POA: Diagnosis not present

## 2019-07-30 MED ORDER — CILOSTAZOL 50 MG PO TABS: 50.0000 mg | ORAL_TABLET | Freq: Two times a day (BID) | ORAL | 1 refills | Status: DC

## 2019-07-30 NOTE — Assessment & Plan Note (Signed)
Chronic problems exacerbation noted.  Bilateral injections given today.  Follow-up again in 4 to 8 weeks.  Differential includes lumbar radiculopathy x-rays of the back

## 2019-07-30 NOTE — Assessment & Plan Note (Signed)
Believe the patient continues to have some peripheral artery disease.  Patient continues to be tested yearly.  Change to Pletal to see if this is per contributing to some of it.  Warned of potential side effects.  Patient is continue on the amlodipine.  Follow-up with me in 4 to 8 weeks

## 2019-07-30 NOTE — Assessment & Plan Note (Signed)
Chronic problem with exacerbation, due to patient's other comorbidities this changes social determinants of health.  At this point this seems to be the safest option for this individual.  Discussed with patient about continuing to stay active, encouraged the idea of potential formal physical therapy.  Follow-up with me again in 4 to 8 weeks

## 2019-07-30 NOTE — Assessment & Plan Note (Signed)
Concern for possible worsening of the arthritic changes.  She was ordered today to see if there has been any progression.  Last MRI moderate for mild foraminal narrowing at L3-L4 that was independently visualized by me.  Moderate to severe bilateral foraminal narrowing at L4-L5.  Multiple epidurals have been done previously.

## 2019-07-30 NOTE — Patient Instructions (Addendum)
CoQ10 200mg  with statin Try Pletal 2x a day and discontinue amoldopine

## 2019-07-31 ENCOUNTER — Other Ambulatory Visit: Payer: Self-pay | Admitting: Family Medicine

## 2019-08-12 ENCOUNTER — Encounter: Payer: Self-pay | Admitting: Registered Nurse

## 2019-08-12 ENCOUNTER — Encounter: Payer: Medicare HMO | Attending: Physical Medicine & Rehabilitation | Admitting: Registered Nurse

## 2019-08-12 ENCOUNTER — Other Ambulatory Visit: Payer: Self-pay

## 2019-08-12 VITALS — BP 183/91 | HR 70 | Resp 14 | Ht 67.0 in | Wt 184.0 lb

## 2019-08-12 DIAGNOSIS — I739 Peripheral vascular disease, unspecified: Secondary | ICD-10-CM | POA: Insufficient documentation

## 2019-08-12 DIAGNOSIS — M25551 Pain in right hip: Secondary | ICD-10-CM | POA: Insufficient documentation

## 2019-08-12 DIAGNOSIS — R531 Weakness: Secondary | ICD-10-CM

## 2019-08-12 DIAGNOSIS — G252 Other specified forms of tremor: Secondary | ICD-10-CM | POA: Diagnosis not present

## 2019-08-12 DIAGNOSIS — R54 Age-related physical debility: Secondary | ICD-10-CM

## 2019-08-12 DIAGNOSIS — Z5181 Encounter for therapeutic drug level monitoring: Secondary | ICD-10-CM

## 2019-08-12 DIAGNOSIS — M1712 Unilateral primary osteoarthritis, left knee: Secondary | ICD-10-CM | POA: Diagnosis not present

## 2019-08-12 DIAGNOSIS — M47816 Spondylosis without myelopathy or radiculopathy, lumbar region: Secondary | ICD-10-CM | POA: Diagnosis not present

## 2019-08-12 DIAGNOSIS — G894 Chronic pain syndrome: Secondary | ICD-10-CM | POA: Diagnosis not present

## 2019-08-12 DIAGNOSIS — Z79891 Long term (current) use of opiate analgesic: Secondary | ICD-10-CM | POA: Diagnosis not present

## 2019-08-12 MED ORDER — OXYCODONE-ACETAMINOPHEN 7.5-325 MG PO TABS: 1.0000 | ORAL_TABLET | Freq: Three times a day (TID) | ORAL | 0 refills | Status: DC | PRN

## 2019-08-12 NOTE — Progress Notes (Signed)
Subjective:    Patient ID: Kevin [name], male    DOB: [birth year], 79 y.o.   MRN: [id number]  HPI: Kevin [name] is a 79 y.o. male who returns for follow up appointment for chronic pain and medication refill. He states his pain is located in his lower back and left knee. He rates his pain 5. His current exercise regime is walking in his home with his cane.  He arrived today with complaints of weakness and tremors noted, he and his wife reports Dr [name] following. Mr. Soulliere wearing a cardiac monitor.   Mr. Mattie arrived hypertensive, he was called and was instructed to check his blood pressure.  At 1:20 PM Mr. Winterrowd was called, he reports his blood pressure at 11:30 was 136/ 86 and he checked it at noon and his blood pressure was 123/89. He will continue to monitor.    Mr. Pape Morphine equivalent is 32.81 MME.    Last Oral Swab was Performed on 03/18/2019, it was consistent.    Pain Inventory Average Pain 7 Pain Right Now 5 My pain is dull and aching  In the last 24 hours, has pain interfered with the following? General activity 7 Relation with others 8 Enjoyment of life 9 What TIME of day is your pain at its worst? evening Sleep (in general) Fair  Pain is worse with: walking, bending, sitting and standing Pain improves with: rest and medication Relief from Meds: 4  Mobility walk with assistance use a cane do you drive?  no Do you have any goals in this area?  no  Function retired  Neuro/Psych weakness tremor trouble walking dizziness  Prior Studies Any changes since last visit?  no  Physicians involved in your care Any changes since last visit?  no   Family History  Problem Relation Age of Onset  . Parkinsonism Father   . Dementia Father   . Cancer Father        Throat  . Colon cancer Mother        died in 38s  . Cancer Mother        Colon   Social History   Socioeconomic History  . Marital status: Married    Spouse name: Not on file  . Number of  children: Not on file  . Years of education: Not on file  . Highest education level: Not on file  Occupational History  . Occupation: retired    Comment: maintenance; electrician  Tobacco Use  . Smoking status: Former Smoker    Packs/day: 2.00    Years: 28.00    Pack years: 56.00    Types: Cigarettes    Quit date: 05/31/1987    Years since quitting: 32.2  . Smokeless tobacco: Never Used  . Tobacco comment: quit smoking "in my 27's"  Substance and Sexual Activity  . Alcohol use: Yes    Alcohol/week: 0.0 standard drinks    Comment: rare, mikes hard lemonade  . Drug use: No    Types: Marijuana    Comment: hx of-4-5 yrs ago  . Sexual activity: Not Currently  Other Topics Concern  . Not on file  Social History Narrative   Married 33 years in 2015. Lived together for 12 years. 2 boys from first wife. 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  Hobbies: yardwork, [place] barn, woodwork, Orthoptist   Social Determinants of Health   Financial Resource Strain:   . Difficulty of Paying Living Expenses:   Food Insecurity:   . Worried About Charity fundraiser in the Last Year:   . Arboriculturist in the Last Year:   Transportation Needs:   . Film/video editor (Medical):   Marland Kitchen Lack of Transportation (Non-Medical):   Physical Activity:   . Days of Exercise per Week:   . Minutes of Exercise per Session:   Stress:   . Feeling of Stress :   Social Connections:   . Frequency of Communication with Friends and Family:   . Frequency of Social Gatherings with Friends and Family:   . Attends Religious Services:   . Active Member of Clubs or Organizations:   . Attends Archivist Meetings:   Marland Kitchen Marital Status:    Past Surgical History:  Procedure Laterality Date  . ABDOMINAL AORTIC ANEURYSM REPAIR  2010  . CAROTID ENDARTERECTOMY Left 09/20/2004  .  CATARACT EXTRACTION, BILATERAL     2016-2017  . COLONOSCOPY    . 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 ENDARTERECTOMY Right 10/12/2015   Procedure: RIGHT CAROTID ENDARTERECTOMY WITH PATCH ANGIOPLASTY;  Surgeon: Elam Dutch, MD;  Location: [facility];  Service: Vascular;  Laterality: Right;  . INGUINAL HERNIA REPAIR Left 02/05/2013   Procedure: HERNIA REPAIR INGUINAL ADULT;  Surgeon: Joyice Faster. Cornett, MD;  Location: [facility];  Service: General;  Laterality: Left;  . INGUINAL HERNIA REPAIR Left   . INSERTION OF MESH Left 02/05/2013   Procedure: INSERTION OF MESH;  Surgeon: Joyice Faster. Cornett, MD;  Location: [facility];  Service: General;  Laterality: Left;  . LEFT HEART CATH AND CORONARY ANGIOGRAPHY N/A 06/21/2018   Procedure: LEFT HEART CATH AND CORONARY ANGIOGRAPHY;  Surgeon: Jettie Booze, MD;  Location: [facility];  Service: Cardiovascular;  Laterality: N/A;  . SHOULDER ARTHROSCOPY WITH ROTATOR CUFF REPAIR AND SUBACROMIAL DECOMPRESSION Left 05/01/2014   Procedure: LEFT ARTHROSCOPY SHOULDER SUBACROMIAL DECOMPRESSION,DISTAL CLAVICAL RESECTION AND ROTATOR CUFF REPAIR;  Surgeon: Marin Shutter, MD;  Location: [facility];  Service: Orthopedics;  Laterality: Left;  . TEE WITHOUT CARDIOVERSION N/A 06/27/2018   Procedure: TRANSESOPHAGEAL ECHOCARDIOGRAM (TEE);  Surgeon: Melrose Nakayama, MD;  Location: [facility];  Service: Open Heart Surgery;  Laterality: N/A;  . TESTICLAR CYST EXCISION Left   . TONSILLECTOMY    . VASECTOMY     Past Medical History:  Diagnosis Date  . Arthritis    DDD- Lower Back  . Bradycardia   . CAP (community acquired pneumonia) 03/17/2015   "THAT'S WHAT THEY ARE  THINKING; THEY ARE NOT SURE"  . Carotid artery occlusion   . Chest pain 06/19/2018  . Chronic lower back pain    DDD  . CKD (chronic kidney disease)    Dr. Corliss Parish  . 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 Diverticulosis   . Enlarged prostate    takes Flomax daily  .  History of blood transfusion    "probably when they did aortic aneurysm"  . History of colon polyps    benign  . Hyperlipidemia   . Hypertension    takes Amlodipine and Zebeta daily  . Insomnia    takes Melatonin nightly  . Joint pain   . 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     . Muscle spasm    takes Zanaflex daily as needed  . Peripheral edema    takes Furosemide daily  . Pneumonia    hx of   . Rheumatic fever 1946  . Rheumatoid arthritis ([place])   . Short-term memory loss    minimal  . Shortness of breath dyspnea    with exertion but lasts very short period of time  . TIA (transient ischemic attack)    x 2;takes Plavix daily  . Urinary frequency    takes Flomax daily  . Urinary urgency    BP (!) 183/91   Pulse 70   Resp 14   Ht 5\' 7"  (1.702 m)   Wt 184 lb (83.5 kg) Comment: previous  SpO2 97%   BMI 28.82 kg/m   Opioid Risk Score:   Fall Risk Score:  `1  Depression screen PHQ 2/9  Depression screen [facility] 2/9 05/09/2019 04/16/2019 04/16/2019 08/09/2018 04/16/2018 01/22/2018 10/19/2017  Decreased Interest 1 0 0 0 0 0 0  Down, Depressed, Hopeless 1 0 0 0 0 0 0  PHQ - 2 Score 2 0 0 0 0 0 0  Altered sleeping 0 - - - - - -  Tired, decreased energy 3 - - - - - -  Change in appetite 1 - - - - - -  Feeling bad or failure about yourself  1 - - - - - -  Trouble concentrating 1 - - - - - -  Moving slowly or fidgety/restless 0 - - - - - -  PHQ-9 Score 8 - - - - - -  Some recent data might be hidden     Review of Systems  Constitutional: Negative.   HENT: Negative.   Eyes: Negative.   Respiratory: Negative.   Cardiovascular: Negative.   Endocrine: Negative.   Genitourinary: Negative.   Musculoskeletal: Positive for arthralgias, back pain and gait problem.  Skin: Negative.   Allergic/Immunologic: Negative.   Neurological: Positive for dizziness, tremors, weakness and headaches.  Psychiatric/Behavioral: Negative.   All other systems reviewed and are negative.      Objective:   Physical Exam Vitals and nursing note reviewed.  Constitutional:      Appearance: Normal appearance. He is ill-appearing.     Comments: Fatigue/ Weakness  Cardiovascular:     Rate and Rhythm: Normal rate and regular rhythm.     Pulses: Normal pulses.     Heart sounds: Normal heart sounds.  Pulmonary:     Effort: Pulmonary effort is normal.     Breath sounds: Normal breath sounds.  Musculoskeletal:     Cervical back: Normal range of motion and neck supple.     Comments: Normal Muscle Bulk and Muscle Testing Reveals:  Upper Extremities:Decreased ROM 90 Degrees  and Muscle Strength 4/5 Lumbar Paraspinal Tenderness: L-3-L-5 Lower Extremities: Full ROM and Muscle Strength 5/5 Transferred from Examining Table to wheelchair  Skin:    General: Skin is warm and dry.  Neurological:     Mental Status: He is alert and oriented to person, place, and time.  Psychiatric:  Mood and Affect: Mood normal.        Behavior: Behavior normal.           Assessment & Plan:  1. Lumbar Spondylosis/ Lumbar Stenosis:08/12/2019 Continue current medication regime. Refilled:Oxycodone 7.5/325 mg one tabletevery 8 hoursas needed #70.08/12/2019 We will continue the opioid monitoring program, this consists of regular clinic visits, examinations, urine drug screen, pill counts as well as use of [place] Controlled Substance Reporting System. 2.Bilateral Knees OA/ChronicBilateralKnee  Pain:S/PBilateral KneeInjection by Dr Paulla Fore on 02/26/2019, Orthopedist Following Dr. Smith.07/30/2019. 3.BilateralGreater Trochanter Bursitis:No complaints today.Orthopedist Following.Continue to alternate Ice and Heat Therapy.08/12/2019 F/U in 1 month

## 2019-08-15 ENCOUNTER — Encounter: Payer: Self-pay | Admitting: Internal Medicine

## 2019-08-15 ENCOUNTER — Other Ambulatory Visit: Payer: Self-pay

## 2019-08-15 ENCOUNTER — Telehealth: Payer: Self-pay | Admitting: Internal Medicine

## 2019-08-15 ENCOUNTER — Ambulatory Visit: Payer: Medicare HMO | Admitting: Internal Medicine

## 2019-08-15 VITALS — BP 170/86 | HR 87 | Ht 67.0 in | Wt 183.0 lb

## 2019-08-15 DIAGNOSIS — I2510 Atherosclerotic heart disease of native coronary artery without angina pectoris: Secondary | ICD-10-CM | POA: Diagnosis not present

## 2019-08-15 DIAGNOSIS — I5032 Chronic diastolic (congestive) heart failure: Secondary | ICD-10-CM | POA: Diagnosis not present

## 2019-08-15 DIAGNOSIS — E785 Hyperlipidemia, unspecified: Secondary | ICD-10-CM

## 2019-08-15 MED ORDER — HYDRALAZINE HCL 25 MG PO TABS: 25.0000 mg | ORAL_TABLET | Freq: Two times a day (BID) | ORAL | 3 refills | Status: DC

## 2019-08-15 MED ORDER — ROSUVASTATIN CALCIUM 40 MG PO TABS: 40.0000 mg | ORAL_TABLET | Freq: Every day | ORAL | 3 refills | Status: DC

## 2019-08-15 NOTE — Patient Instructions (Signed)
Medication Instructions:  Your physician has recommended you make the following change in your medication:  1.) increase rosuvastatin (Crestor) to 40 mg daily 2.) start hydralazine 25 mg --one tablet twice a day  *If you need a refill on your cardiac medications before your next appointment, please call your pharmacy*   Lab Work: Today: bmet, bnp  If you have labs (blood work) drawn today and your tests are completely normal, you will receive your results only by: Marland Kitchen MyChart Message (if you have MyChart) OR . A paper copy in the mail If you have any lab test that is abnormal or we need to change your treatment, we will call you to review the results.   Testing/Procedures: none   Follow-Up: At [facility], you and your health needs are our priority.  As part of our continuing mission to provide you with exceptional heart care, we have created designated Provider Care Teams.  These Care Teams include your primary Cardiologist (physician) and Advanced Practice Providers (APPs -  Physician Assistants and Nurse Practitioners) who all work together to provide you with the care you need, when you need it.  Your next appointment:   8 week(s)  The format for your next appointment:   In Person  Provider:   You may see one of the following Advanced Practice Providers on your designated Care Team:    Richardson Dopp, PA-C  [place], [place]  Daune Perch, NP    Other Instructions Return monitor after you complete the 30 day period of wearing it.

## 2019-08-15 NOTE — Progress Notes (Addendum)
Cardiology Office Note   Date:  08/15/2019   ID:  RAHEEL KUNKLE, DOB 02/14/1941, MRN [id number]  PCP:  Marin Olp, MD  Cardiologist:   Dorris Carnes, MD   Pt seen for f/u of CAD    History of Present Illness: Kevin [name] is a 79 y.o. male with a history ofCAD (s/p CABG 2020), hypertension, hyperlipidemia, bradycardia, carotid artery disease s/p bilateral CEA, TIA x2 previously on Plavix, RA, CKD stage III.  The patient was admitted to the hospital in 05/2018 with chest pain and found to have multivessel CAD.  He underwent CABG x4 (LIMA to LAD; SVG to D1, SVG to OM2; SVG to PDA) on 06/27/2018. EF was 60-65% by echo. He had postop atrial fibrillation and was placed on amiodarone and anticoagulation which have both been discontinued since then.  I saw the pt in Sept as a televisit   He was seen by Buren Kos in clinic in November  Had some confusion prior, felt weak    HR noted to be irregular   Set up for event monitor    Since seen by Buren Kos he remains weak.   Denies CP           Current Meds  Medication Sig  . albuterol (VENTOLIN HFA) 108 (90 Base) MCG/ACT inhaler Inhale 2 puffs into the lungs every 12 (twelve) hours as needed for wheezing or shortness of breath.  Marland Kitchen aspirin EC 81 MG EC tablet Take 1 tablet (81 mg total) by mouth daily.  Marland Kitchen atorvastatin (LIPITOR) 20 MG tablet TAKE 1 TABLET EVERY DAY  . BIOTIN PO Take 1 tablet by mouth daily.  . Cholecalciferol (VITAMIN D-3) 1000 UNITS CAPS Take 1,000 Units by mouth daily.   . cilostazol (PLETAL) 50 MG tablet Take 1 tablet (50 mg total) by mouth 2 (two) times daily.  . diazepam (VALIUM) 5 MG tablet TAKE 1/2 TO 1 TABLET q8 hours PRN pain sleep/muscle spasm-only if wife is around+can watch you closely when taking due to sedation risk  . fenofibrate 160 MG tablet Take 1 tablet (160 mg total) by mouth daily.  . furosemide (LASIX) 20 MG tablet TAKE 1 TABLET EVERY DAY  . gabapentin (NEURONTIN) 400 MG capsule Take 1 capsule  (400 mg total) by mouth 2 (two) times daily. 400 mg twice a day  . linaclotide (LINZESS) 145 MCG CAPS capsule TAKE 1 CAPSULE BY MOUTH ONCE DAILY BEFORE BREAKFAST  . Melatonin 10 MG TABS Take 10 mg by mouth at bedtime.   . metoprolol tartrate (LOPRESSOR) 25 MG tablet Take 1 tablet (25 mg total) by mouth 2 (two) times daily.  Marland Kitchen oxyCODONE-acetaminophen (PERCOCET) 7.5-325 MG tablet Take 1 tablet by mouth every 8 (eight) hours as needed for severe pain.  . tamsulosin (FLOMAX) 0.4 MG CAPS capsule Take 1 capsule (0.4 mg total) by mouth daily.  Marland Kitchen tiZANidine (ZANAFLEX) 4 MG tablet TAKE 1 TABLET EVERY 6 HOURS AS NEEDED FOR MUSCLE SPASMS  . vitamin B-12 (CYANOCOBALAMIN) 1000 MCG tablet Take 1,000 mcg by mouth daily.  . [DISCONTINUED] rosuvastatin (CRESTOR) 20 MG tablet Take 1 tablet (20 mg total) by mouth daily.     Allergies:   Patient has no known allergies.   Past Medical History:  Diagnosis Date  . Arthritis    DDD- Lower Back  . Bradycardia   . CAP (community acquired pneumonia) 03/17/2015   "THAT'S WHAT THEY ARE THINKING; THEY ARE NOT SURE"  . Carotid artery occlusion   . Chest  pain 06/19/2018  . Chronic lower back pain    DDD  . CKD (chronic kidney disease)    Dr. Corliss Parish  . 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 Diverticulosis   . Enlarged prostate    takes Flomax daily  . History of blood transfusion    "probably when they did aortic aneurysm"  . History of colon polyps    benign  . Hyperlipidemia   . Hypertension    takes Amlodipine and Zebeta daily  . Insomnia    takes Melatonin nightly  . Joint pain   . 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     . Muscle spasm    takes Zanaflex daily as needed  . Peripheral edema    takes Furosemide daily  . Pneumonia    hx of   .  Rheumatic fever 1946  . Rheumatoid arthritis ([place])   . Short-term memory loss    minimal  . Shortness of breath dyspnea    with exertion but lasts very short period of time  . TIA (transient ischemic attack)    x 2;takes Plavix daily  . Urinary frequency    takes Flomax daily  . Urinary urgency     Past Surgical History:  Procedure Laterality Date  . ABDOMINAL AORTIC ANEURYSM REPAIR  2010  . CAROTID ENDARTERECTOMY Left 09/20/2004  . CATARACT EXTRACTION, BILATERAL     2016-2017  . COLONOSCOPY    . CORONARY ARTERY BYPASS GRAFT N/A 06/27/2018   Procedure: CORONARY ARTERY BYPASS GRAFTING (CABG), ON PUMP, TIMES FOUR, USING LEFT INTERNAL MAMMARY ARTERY AND ENDOSCOPICALLY HARVESTED LEFT GREATER SAPHENOUS VEIN;  Surgeon: Melrose Nakayama, MD;  Location: [place];  Service: Open Heart Surgery;  Laterality: N/A;  . ENDARTERECTOMY Right 10/12/2015   Procedure: RIGHT CAROTID ENDARTERECTOMY WITH PATCH ANGIOPLASTY;  Surgeon: Elam Dutch, MD;  Location: [place];  Service: Vascular;  Laterality: Right;  . INGUINAL HERNIA REPAIR Left 02/05/2013   Procedure: HERNIA REPAIR INGUINAL ADULT;  Surgeon: Joyice Faster. Cornett, MD;  Location: [place];  Service: General;  Laterality: Left;  . INGUINAL HERNIA REPAIR Left   . INSERTION OF MESH Left 02/05/2013   Procedure: INSERTION OF MESH;  Surgeon: Joyice Faster. Cornett, MD;  Location: [place];  Service: General;  Laterality: Left;  . LEFT HEART CATH AND CORONARY ANGIOGRAPHY N/A 06/21/2018   Procedure: LEFT HEART CATH AND CORONARY ANGIOGRAPHY;  Surgeon: Jettie Booze, MD;  Location: [place] [facility];  Service: Cardiovascular;  Laterality: N/A;  . SHOULDER ARTHROSCOPY WITH ROTATOR CUFF REPAIR AND SUBACROMIAL DECOMPRESSION Left 05/01/2014   Procedure: LEFT ARTHROSCOPY SHOULDER SUBACROMIAL DECOMPRESSION,DISTAL CLAVICAL RESECTION AND ROTATOR CUFF REPAIR;  Surgeon: Marin Shutter, MD;  Location: [place];  Service: Orthopedics;   Laterality: Left;  . TEE WITHOUT CARDIOVERSION N/A 06/27/2018   Procedure: TRANSESOPHAGEAL ECHOCARDIOGRAM (TEE);  Surgeon: Melrose Nakayama, MD;  Location: [place];  Service: Open Heart Surgery;  Laterality: N/A;  . TESTICLAR CYST EXCISION Left   . TONSILLECTOMY    . VASECTOMY       Social History:  The patient  reports that he quit smoking about 32 years ago. His smoking use included cigarettes. He has a 56.00 pack-year smoking history. He has never used smokeless tobacco. He reports current  alcohol use. He reports that he does not use drugs.   Family History:  The patient's family history includes Cancer in his father and mother; Colon cancer in his mother; Dementia in his father; Parkinsonism in his father.    ROS:  Please see the history of present illness. All other systems are reviewed and  Negative to the above problem except as noted.    PHYSICAL EXAM: VS:  BP (!) 170/86   Pulse 87   Ht 5\' 7"  (1.702 m)   Wt 183 lb (83 kg)   SpO2 91%   BMI 28.66 kg/m   GEN: Well nourished, well developed, in no acute distress  HEENT: normal  Neck: no JVD Cardiac: RRR; no murmurs,  Trivial LE edema  Respiratory:  clear to auscultation bilaterally,  GI: soft, nontender, nondistended,   No hepatomegaly  MS:  Moving all extremities   Skin: warm and dry, no rash  EKG:  EKG is ordered today.  SR 87  RBBB.  LAD   Lipid Panel    Component Value Date/Time   CHOL 150 07/08/2019 1442   TRIG 158.0 (H) 07/08/2019 1442   HDL 37.50 (L) 07/08/2019 1442   CHOLHDL 4 07/08/2019 1442   VLDL 31.6 07/08/2019 1442   LDLCALC 81 07/08/2019 1442   LDLDIRECT 134.9 12/26/2012 1201      Wt Readings from Last 3 Encounters:  08/15/19 183 lb (83 kg)  08/12/19 184 lb (83.5 kg)  07/30/19 184 lb (83.5 kg)      ASSESSMENT AND PLAN:  1  CAD   Pt is s/p CABG in 2020   Unfortunately never got to do cardiac rehab due to COVID pandemic   Remains very weak   Will see if he can get PT Will check BMET and  BNP   2 Afib  Only after CABG   No documented recurrence  Wearing monitor since 3/1   Only SR.    Keep on ASA for now  3  HTN  BP is very high  Will add hydralazine to 25 mg bid    3Anemia  4  Deconditioning Pt seen by Creig Hines   Will see about getting more PT      5   HL  Increase Crestor to 40mg    F/U lipids in 8 wks  F/U in 6 to 8 wks      Current medicines are reviewed at length with the patient today.  The patient does not have concerns regarding medicines.  Signed, Dorris Carnes, MD  08/15/2019 8:47 PM    [address], [place], [place]  [address] Phone: [phone]; Fax: [phone]

## 2019-08-15 NOTE — Telephone Encounter (Signed)
New message:    Patient calling stating that his wife has to come to the apt with him he has short term memory. Please call patient or his wife if any questions.

## 2019-08-15 NOTE — Telephone Encounter (Signed)
Called and spoke to patient and his wife. They said that the patient has problems remembering things and the wife will need to come with the patient to his appt this afternoon. Made them aware that I will forward to Dr. Harrington Challenger' RN to make aware.

## 2019-08-16 ENCOUNTER — Telehealth: Payer: Self-pay | Admitting: Internal Medicine

## 2019-08-16 LAB — BASIC METABOLIC PANEL
BUN/Creatinine Ratio: 10 (ref 10–24)
BUN: 23 mg/dL (ref 8–27)
CO2: 25 mmol/L (ref 20–29)
Calcium: 9 mg/dL (ref 8.6–10.2)
Chloride: 103 mmol/L (ref 96–106)
Creatinine, Ser: 2.38 mg/dL — ABNORMAL HIGH (ref 0.76–1.27)
GFR calc Af Amer: 29 mL/min/{1.73_m2} — ABNORMAL LOW (ref >59)
GFR calc non Af Amer: 25 mL/min/{1.73_m2} — ABNORMAL LOW (ref >59)
Glucose: 102 mg/dL — ABNORMAL HIGH (ref 65–99)
Potassium: 4.3 mmol/L (ref 3.5–5.2)
Sodium: 139 mmol/L (ref 134–144)

## 2019-08-16 LAB — PRO B NATRIURETIC PEPTIDE: NT-Pro BNP: 458 pg/mL (ref 0–486)

## 2019-08-16 NOTE — Telephone Encounter (Signed)
Pt c/o medication issue:  1. Name of Medication: hydrALAZINE (APRESOLINE) 25 MG tablet  2. How are you currently taking this medication (dosage and times per day)? Pt took first pill last night  3. Are you having a reaction (difficulty breathing--STAT)? Pt is not having sx at the time of the call  4. What is your medication issue? Pt felt like the medication made him drunk.  Wife of pt was on the line and states he was sluggish,  unsteady on his feet, and basically felt drunk without actually drinking. Wife states she did not take his BP, but it was probably low.   He had a similar reaction in the past which caused his wife to call EMS. The wife just put him to bed yesterday  The patient does not want to take this medication anymore. He does not want to end up in the hospital

## 2019-08-16 NOTE — Telephone Encounter (Signed)
Took hydralazine 6:30, by 7:30 pm he was extremely weird.  He was swimmy headed and needed assistance to get into a chair.  Like his head was floating.   He was pale.  No balance.  Was not disoriented and did not pass out.  No BP was obtained. Sat in chair for a while.  Assisted to bed.  Was feeling a little bit better.   Felt normal when he woke this am. No hydralazine taken today.  He is afraid to take another one. Today BP has been 152/80, 76     139/80, 80     2 hrs ago 147/92 87  Aware I will forward to Dr. Harrington Challenger for review and will call him back with any new recommendations.

## 2019-08-16 NOTE — Telephone Encounter (Signed)
Spoke with patient's wife. Confirmed medications. He takes 10 mg amlodipine daily in AM. Metoprolol 25 mg twice a day  Last evening in addition to metoprolol he took hydralazine 25 mg His usual evening meds also include: Oxycodone/acetaminophin 7.5 mg Gabapentin 400 mg Diazepam Melatonin 10 mg zanaflex He takes these nightly and does not experience the symptoms he had last evening.  I adv her that Dr. Harrington Challenger is reviewed medications for options of what to try for BP. His last BP today was 119/77 and patient feels his normal self since he got up this am.

## 2019-08-16 NOTE — Telephone Encounter (Signed)
Confirm BP meds with patient  Is he taking amlodipine 5 bid

## 2019-08-16 NOTE — Telephone Encounter (Signed)
He is extremely sensative to hydralazine  I would recomm stop as he has.  In a couple days I would try 1/2 of a 2.5 mg amlodipine    Very gentle/small dose

## 2019-08-19 ENCOUNTER — Ambulatory Visit: Payer: Medicare HMO | Admitting: Gastroenterology

## 2019-08-19 NOTE — Telephone Encounter (Signed)
Hydralazine discontinued.

## 2019-08-19 NOTE — Telephone Encounter (Signed)
Pt feeling not great today   Low grade fever, cough   T max 99.8 BP 114/ after meds   Keep track of BP on curret regimen   Contact primary MD if T elevated and breathing worsens.

## 2019-08-21 ENCOUNTER — Ambulatory Visit: Payer: Medicare HMO

## 2019-08-21 ENCOUNTER — Telehealth: Payer: Self-pay | Admitting: Family Medicine

## 2019-08-21 NOTE — Telephone Encounter (Signed)
Patient requested appt with [facility] tomorrow. Scheduled for 10 AM

## 2019-08-21 NOTE — Telephone Encounter (Signed)
Pt wife called stating pt is supposed to get his 2nd Pfeizer covid vaccine today. Wife states pt has not been feeling very well since he received his first vaccine. Wife states pt is still not feeling well today and is experiencing fatigue and a low grade fever. Wife is wanting nurse to call her to advise on what pt should do regarding the vaccine - should he get the second dose/reschedule/or not get it. Pt appt is at 2:40 this afternoon to get vaccine. Please advise.

## 2019-08-21 NOTE — Telephone Encounter (Signed)
Starting after his first covid injection. He has had off and on symptoms. He has had fever, extreme fatigue, weakness . He was not able to keep her GI app due to symptoms. Wife states he was seen by DR. Ross and started on new meds last week that "almost killed him" she states that there have been a few days during this time that she thought it was his last day here because she was not able to get him out of bed. He does have  confusion and trouble slurring words. She denies any n&V, does have los of taste that has happened over the last few months. He has had decreased appetite. Patient refuses to go to hospital.

## 2019-08-21 NOTE — Telephone Encounter (Signed)
Im out of the office rest of the week- he can see one of other providers - sounds like should start virtual with low grade fever. They may very well advise ER still

## 2019-08-22 ENCOUNTER — Ambulatory Visit (INDEPENDENT_AMBULATORY_CARE_PROVIDER_SITE_OTHER): Payer: Medicare HMO | Admitting: Adult Health

## 2019-08-22 ENCOUNTER — Encounter: Payer: Self-pay | Admitting: Adult Health

## 2019-08-22 VITALS — BP 106/47 | HR 80

## 2019-08-22 DIAGNOSIS — J988 Other specified respiratory disorders: Secondary | ICD-10-CM

## 2019-08-22 MED ORDER — DOXYCYCLINE HYCLATE 100 MG PO CAPS: 100.0000 mg | ORAL_CAPSULE | Freq: Two times a day (BID) | ORAL | 0 refills | Status: DC

## 2019-08-22 NOTE — Progress Notes (Signed)
Patient has a pending GI appt 09-10-2019 at 1:40 pm

## 2019-08-22 NOTE — Progress Notes (Signed)
Virtual Visit via Video Note  I connected with Kevin [name] 08/22/19 at 10:00 AM EDT by a video enabled telemedicine application and verified that I am speaking with the correct person using two identifiers.  Location patient: home Location provider:work or home office Persons participating in the virtual visit: patient, provider, wife  I discussed the limitations of evaluation and management by telemedicine and the availability of in person appointments. The patient expressed understanding and agreed to proceed.   HPI: The patient is being seen today via video conference for an acute issue.  Symptoms started a few days after his first Covid vaccination on March 3.  His symptoms have been intermittent.  His symptoms include that of fever up to 100.8, extreme fatigue, weakness, and chills.  More constant symptoms include a productive cough, wheezing, and a "crackling in my chest".  Additionally he reports sinus congestion but no sinus pain or pressure.  He denies nausea, vomiting, or diarrhea.  He refuses to go to the emergency room   ROS: See pertinent positives and negatives per HPI.  Past Medical History:  Diagnosis Date  . Arthritis    DDD- Lower Back  . Bradycardia   . CAP (community acquired pneumonia) 03/17/2015   "THAT'S WHAT THEY ARE THINKING; THEY ARE NOT SURE"  . Carotid artery occlusion   . Chest pain 06/19/2018  . Chronic lower back pain    DDD  . CKD (chronic kidney disease)    Dr. Corliss Parish  . 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 Diverticulosis   . Enlarged prostate    takes Flomax daily  . History of blood transfusion    "probably when they did aortic aneurysm"  . History of colon polyps    benign  . Hyperlipidemia   . Hypertension    takes Amlodipine and Zebeta daily  . Insomnia    takes Melatonin nightly  . Joint pain   . 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     . Muscle spasm    takes Zanaflex daily as needed  . Peripheral edema    takes Furosemide daily  . Pneumonia    hx of   . Rheumatic fever 1946  . Rheumatoid arthritis ([place])   . Short-term memory loss    minimal  . Shortness of breath dyspnea    with exertion but lasts very short period of time  . TIA (transient ischemic attack)    x 2;takes Plavix daily  . Urinary frequency    takes Flomax daily  . Urinary urgency     Past Surgical History:  Procedure Laterality Date  . ABDOMINAL AORTIC ANEURYSM REPAIR  2010  . CAROTID ENDARTERECTOMY Left 09/20/2004  . CATARACT EXTRACTION, BILATERAL     2016-2017  . COLONOSCOPY    . CORONARY ARTERY BYPASS GRAFT N/A 06/27/2018   Procedure: CORONARY ARTERY BYPASS GRAFTING (CABG), ON PUMP, TIMES FOUR, USING LEFT INTERNAL MAMMARY ARTERY AND ENDOSCOPICALLY HARVESTED LEFT GREATER SAPHENOUS VEIN;  Surgeon: Melrose Nakayama, MD;  Location: [place];  Service: Open Heart Surgery;  Laterality: N/A;  . ENDARTERECTOMY Right 10/12/2015   Procedure: RIGHT CAROTID ENDARTERECTOMY WITH PATCH ANGIOPLASTY;  Surgeon: Elam Dutch, MD;  Location: [place];  Service: Vascular;  Laterality: Right;  . INGUINAL HERNIA REPAIR Left 02/05/2013   Procedure:  HERNIA REPAIR INGUINAL ADULT;  Surgeon: Joyice Faster. Cornett, MD;  Location: [place];  Service: General;  Laterality: Left;  . INGUINAL HERNIA REPAIR Left   . INSERTION OF MESH Left 02/05/2013   Procedure: INSERTION OF MESH;  Surgeon: Joyice Faster. Cornett, MD;  Location: [place];  Service: General;  Laterality: Left;  . LEFT HEART CATH AND CORONARY ANGIOGRAPHY N/A 06/21/2018   Procedure: LEFT HEART CATH AND CORONARY ANGIOGRAPHY;  Surgeon: Jettie Booze, MD;  Location: [place] [facility];  Service: Cardiovascular;  Laterality: N/A;  . SHOULDER ARTHROSCOPY WITH ROTATOR CUFF REPAIR AND  SUBACROMIAL DECOMPRESSION Left 05/01/2014   Procedure: LEFT ARTHROSCOPY SHOULDER SUBACROMIAL DECOMPRESSION,DISTAL CLAVICAL RESECTION AND ROTATOR CUFF REPAIR;  Surgeon: Marin Shutter, MD;  Location: [place] [facility];  Service: Orthopedics;  Laterality: Left;  . TEE WITHOUT CARDIOVERSION N/A 06/27/2018   Procedure: TRANSESOPHAGEAL ECHOCARDIOGRAM (TEE);  Surgeon: Melrose Nakayama, MD;  Location: [place];  Service: Open Heart Surgery;  Laterality: N/A;  . TESTICLAR CYST EXCISION Left   . TONSILLECTOMY    . VASECTOMY      Family History  Problem Relation Age of Onset  . Parkinsonism Father   . Dementia Father   . Cancer Father        Throat  . Colon cancer Mother        died in 46s  . Cancer Mother        Colon       Current Outpatient Medications:  .  albuterol (VENTOLIN HFA) 108 (90 Base) MCG/ACT inhaler, Inhale 2 puffs into the lungs every 12 (twelve) hours as needed for wheezing or shortness of breath., Disp: 1 each, Rfl: 6 .  amLODipine (NORVASC) 10 MG tablet, Take 1 tablet by mouth daily., Disp: , Rfl:  .  aspirin EC 81 MG EC tablet, Take 1 tablet (81 mg total) by mouth daily., Disp: , Rfl:  .  atorvastatin (LIPITOR) 20 MG tablet, TAKE 1 TABLET EVERY DAY, Disp: 90 tablet, Rfl: 1 .  BIOTIN PO, Take 1 tablet by mouth daily., Disp: , Rfl:  .  Cholecalciferol (VITAMIN D-3) 1000 UNITS CAPS, Take 1,000 Units by mouth daily. , Disp: , Rfl:  .  cilostazol (PLETAL) 50 MG tablet, Take 1 tablet (50 mg total) by mouth 2 (two) times daily., Disp: 60 tablet, Rfl: 1 .  diazepam (VALIUM) 5 MG tablet, TAKE 1/2 TO 1 TABLET q8 hours PRN pain sleep/muscle spasm-only if wife is around+can watch you closely when taking due to sedation risk, Disp: 90 tablet, Rfl: 0 .  fenofibrate 160 MG tablet, Take 1 tablet (160 mg total) by mouth daily., Disp: 90 tablet, Rfl: 3 .  furosemide (LASIX) 20 MG tablet, TAKE 1 TABLET EVERY DAY, Disp: 90 tablet, Rfl: 1 .  gabapentin (NEURONTIN) 400 MG capsule, Take 1 capsule (400 mg  total) by mouth 2 (two) times daily. 400 mg twice a day, Disp: 180 capsule, Rfl: 3 .  linaclotide (LINZESS) 145 MCG CAPS capsule, TAKE 1 CAPSULE BY MOUTH ONCE DAILY BEFORE BREAKFAST, Disp: 90 capsule, Rfl: 3 .  Melatonin 10 MG TABS, Take 10 mg by mouth at bedtime. , Disp: , Rfl:  .  metoprolol tartrate (LOPRESSOR) 25 MG tablet, Take 1 tablet (25 mg total) by mouth 2 (two) times daily., Disp: 180 tablet, Rfl: 1 .  oxyCODONE-acetaminophen (PERCOCET) 7.5-325 MG tablet, Take 1 tablet by mouth every 8 (eight) hours as needed for severe pain., Disp: 70 tablet, Rfl: 0 .  rosuvastatin (CRESTOR) 40 MG tablet, Take 1 tablet (40 mg total) by mouth daily., Disp: 90 tablet, Rfl: 3 .  tamsulosin (FLOMAX) 0.4 MG CAPS capsule, Take 1 capsule (0.4 mg total) by mouth daily., Disp: 90 capsule, Rfl: 3 .  tiZANidine (ZANAFLEX) 4 MG tablet, TAKE 1 TABLET EVERY 6 HOURS AS NEEDED FOR MUSCLE SPASMS, Disp: 90 tablet, Rfl: 1 .  vitamin B-12 (CYANOCOBALAMIN) 1000 MCG tablet, Take 1,000 mcg by mouth daily., Disp: , Rfl:   EXAM:  VITALS per patient if applicable:  GENERAL: alert, oriented, appears well and in no acute distress  HEENT: atraumatic, conjunttiva clear, no obvious abnormalities on inspection of external nose and ears  NECK: normal movements of the head and neck  LUNGS: on inspection no signs of respiratory distress, breathing rate appears normal, no obvious gross SOB, gasping or wheezing  CV: no obvious cyanosis  MS: moves all visible extremities without noticeable abnormality  PSYCH/NEURO: pleasant and cooperative, no obvious depression or anxiety, speech and thought processing grossly intact  ASSESSMENT AND PLAN:  Discussed the following assessment and plan:  Respiratory infection - Plan: doxycycline (VIBRAMYCIN) 100 MG capsule -There is concern for pneumonia due to symptoms.  We will treat with doxycycline 100 mg twice daily x10 days.  He was advised to rest and stay hydrated.  Follow-up in the  emergency room if symptoms worsen over the weekend and follow-up next week with his PCP.    I discussed the assessment and treatment plan with the patient. The patient was provided an opportunity to ask questions and all were answered. The patient agreed with the plan and demonstrated an understanding of the instructions.   The patient was advised to call back or seek an in-person evaluation if the symptoms worsen or if the condition fails to improve as anticipated.   Dorothyann Peng, NP

## 2019-08-26 ENCOUNTER — Telehealth: Payer: Self-pay

## 2019-08-26 NOTE — Telephone Encounter (Signed)
Patient was seen by Tommi Rumps last week. Please advise

## 2019-08-26 NOTE — Telephone Encounter (Signed)
Patient wife calling to find out what type of cough syrup should her husband take. Patient is coughing a lot during the night

## 2019-08-26 NOTE — Telephone Encounter (Signed)
Dextromethorphan would be a reasonable choice. Cough drops in the daytime or honey.   Please get him covid tested in case we need a follow up in person visit as well- may need chest x-ray if not improving

## 2019-08-27 NOTE — Telephone Encounter (Signed)
Spoke with patient, gave verbal understanding of PCP recommendations. States that he is having a productive cough, but states the phlegm is clear. Patient states that he recently had a COVID test in the last month that was negative, does not want to be re-tested.

## 2019-09-04 ENCOUNTER — Other Ambulatory Visit: Payer: Self-pay | Admitting: Family Medicine

## 2019-09-10 ENCOUNTER — Ambulatory Visit: Payer: Medicare HMO | Admitting: Gastroenterology

## 2019-09-10 ENCOUNTER — Encounter: Payer: Self-pay | Admitting: Gastroenterology

## 2019-09-10 ENCOUNTER — Other Ambulatory Visit: Payer: Self-pay

## 2019-09-10 VITALS — BP 110/72 | HR 75 | Temp 97.1°F | Ht 67.0 in | Wt 176.0 lb

## 2019-09-10 DIAGNOSIS — R195 Other fecal abnormalities: Secondary | ICD-10-CM

## 2019-09-10 DIAGNOSIS — R110 Nausea: Secondary | ICD-10-CM | POA: Diagnosis not present

## 2019-09-10 DIAGNOSIS — K5900 Constipation, unspecified: Secondary | ICD-10-CM

## 2019-09-10 DIAGNOSIS — R6881 Early satiety: Secondary | ICD-10-CM | POA: Diagnosis not present

## 2019-09-10 DIAGNOSIS — Z86010 Personal history of colonic polyps: Secondary | ICD-10-CM | POA: Diagnosis not present

## 2019-09-10 DIAGNOSIS — Z800 Family history of malignant neoplasm of digestive organs: Secondary | ICD-10-CM

## 2019-09-10 NOTE — Progress Notes (Signed)
P  Chief Complaint:    Heme positive stools, chronic constipation, nausea  GI History: 79 y.o. male with a history of CKD 3-4, CAD  S/p CABG 05/2018, PAD, CHF, AAA (s/p repair 2010), CVA in 2013, RA, chronic pain , lumbar spondylosis/lumbar stenosis (chronic oxycodone  bid with occasional third dose) initially seen in GI clinic in 09/2018 for evaluation of left-sided/left flank discomfort/ache and constipation. No hematochezia or melena.  -CT: Left-sided stool burden, diverticulosis, cholelithiasis, otherwise normal -Started on MiraLAX with improvement constipation but no change in pain. 1 BM/day with MiraLAX without straining to have BM. -Thoracic spine x-ray: Degenerative changes, no acute changes -Normal CBC -Normal CMP (aside from BUN/creatinine 29/2.65 c/w CKD) -INR 1.9  Endoscopic history: - Colonoscopy in 2000 and 2007 demonstrated small adenomatous polyps. -Colonoscopy (05/2013, Dr. Deatra Ina): Diverticulosis  HPI:     Patient is a 79 y.o. male presenting to the Gastroenterology Clinic for follow-up. Last seen by virtual health in 09/2018 for evaluation of left-sided/left flank discomfort along with constipation, likely related to opioid use. Constipation improved with MiraLAX. Given CABG earlier in the year, endoscopic evaluation was deferred.  Presents today for evaluation of heme positive stools.  Does have post prandial n/v. Typically within 30 mins of eating. Seems independent of food types and can also have days/meals without sxs. Sxs described as "not feeling good", nausea without emesis.  Weight stable.  Will occasionally also have postprandial fecal urgency (but not diarrhea).   Does have intermittent loose stools, but in the setting of Linzess due to OIC. Still infrequent episodes of constipation, but pretty well regulated with the Linzess.    -07/08/2019: Normal WBC, H/H 13/30.8 with MCV/RDW 92/14.6 (H/H improved from previous 12.3/37 three months prior 10.6/31.8 six  months prior). CMP normal aside from CKD changes (creatinine 2.04). FOBT positive  Was last by Dr. Harrington Challenger in the Cardiology clinic on 08/15/2019. Noted to have continued weakness/deconditioning after CABG (was unable to do cardiac rehab due to Covid pandemic). Set up with heart monitor due to postoperative A. fib. Recommend follow-up in 6-8 weeks.  Was last seen by his PCM, Dr. [name], 07/08/2019 for evaluation of episode of disorientation and feeling very cold to touch per wife of unclear etiology.  Due to mild anemia, FOBT ordered and was positive.   Review of systems:     No chest pain, no SOB, no fevers, no urinary sx   Past Medical History:  Diagnosis Date  . Arthritis    DDD- Lower Back  . Bradycardia   . CAP (community acquired pneumonia) 03/17/2015   "THAT'S WHAT THEY ARE THINKING; THEY ARE NOT SURE"  . Carotid artery occlusion   . Chest pain 06/19/2018  . Chronic lower back pain    DDD  . CKD (chronic kidney disease)    Dr. Corliss Parish  . 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 Diverticulosis   . Enlarged prostate    takes Flomax daily  . History of blood transfusion    "probably when they did aortic aneurysm"  . History of colon polyps    benign  . Hyperlipidemia   . Hypertension    takes Amlodipine and Zebeta daily  . Insomnia    takes Melatonin nightly  . Joint pain   . [place], RIGHT 11/02/2009  Resolved after injection.     . Muscle spasm    takes Zanaflex daily as needed  . Peripheral edema    takes Furosemide daily  . Pneumonia    hx of   . Rheumatic fever 1946  . Rheumatoid arthritis ([place])   . Short-term memory loss    minimal  . Shortness of breath dyspnea    with exertion but lasts very short period of time  . TIA (transient ischemic attack)    x 2;takes Plavix daily  .  Urinary frequency    takes Flomax daily  . Urinary urgency     Patient's surgical history, family medical history, social history, medications and allergies were all reviewed in Epic    Current Outpatient Medications  Medication Sig Dispense Refill  . albuterol (VENTOLIN HFA) 108 (90 Base) MCG/ACT inhaler Inhale 2 puffs into the lungs every 12 (twelve) hours as needed for wheezing or shortness of breath. 1 each 6  . amLODipine (NORVASC) 10 MG tablet Take 1 tablet by mouth daily.    Marland Kitchen aspirin EC 81 MG EC tablet Take 1 tablet (81 mg total) by mouth daily.    Marland Kitchen atorvastatin (LIPITOR) 20 MG tablet TAKE 1 TABLET EVERY DAY 90 tablet 1  . BIOTIN PO Take 1 tablet by mouth daily.    . Cholecalciferol (VITAMIN D-3) 1000 UNITS CAPS Take 1,000 Units by mouth daily.     . cilostazol (PLETAL) 50 MG tablet Take 1 tablet (50 mg total) by mouth 2 (two) times daily. 60 tablet 1  . diazepam (VALIUM) 5 MG tablet TAKE 1/2 TO 1 TABLET q8 hours PRN pain sleep/muscle spasm-only if wife is around+can watch you closely when taking due to sedation risk 90 tablet 0  . doxycycline (VIBRAMYCIN) 100 MG capsule Take 1 capsule (100 mg total) by mouth 2 (two) times daily. 20 capsule 0  . fenofibrate 160 MG tablet Take 1 tablet (160 mg total) by mouth daily. 90 tablet 3  . furosemide (LASIX) 20 MG tablet TAKE 1 TABLET EVERY DAY 90 tablet 1  . gabapentin (NEURONTIN) 400 MG capsule Take 1 capsule (400 mg total) by mouth 2 (two) times daily. 400 mg twice a day 180 capsule 3  . linaclotide (LINZESS) 145 MCG CAPS capsule TAKE 1 CAPSULE BY MOUTH ONCE DAILY BEFORE BREAKFAST 90 capsule 3  . Melatonin 10 MG TABS Take 10 mg by mouth at bedtime.     . metoprolol tartrate (LOPRESSOR) 25 MG tablet Take 1 tablet (25 mg total) by mouth 2 (two) times daily. 180 tablet 1  . oxyCODONE-acetaminophen (PERCOCET) 7.5-325 MG tablet Take 1 tablet by mouth every 8 (eight) hours as needed for severe pain. 70 tablet 0  . rosuvastatin (CRESTOR) 40 MG  tablet Take 1 tablet (40 mg total) by mouth daily. 90 tablet 3  . tamsulosin (FLOMAX) 0.4 MG CAPS capsule Take 1 capsule (0.4 mg total) by mouth daily. 90 capsule 3  . tiZANidine (ZANAFLEX) 4 MG tablet TAKE 1 TABLET EVERY 6 HOURS AS NEEDED FOR MUSCLE SPASMS 90 tablet 1  . vitamin B-12 (CYANOCOBALAMIN) 1000 MCG tablet Take 1,000 mcg by mouth daily.     No current facility-administered medications for this visit.    Physical Exam:     BP 110/72   Pulse 75   Temp (!) 97.1 F (36.2 C)   Ht 5\' 7"  (1.702 m)   Wt 176 lb (79.8 kg)   BMI 27.57 kg/m   GENERAL:  Pleasant male in NAD.  Walks with a cane to assist PSYCH: : Cooperative, normal affect EENT:  conjunctiva pink, mucous membranes moist, neck supple without masses ABDOMEN:  Nondistended, soft, nontender. No obvious masses, no hepatomegaly,  normal bowel sounds NEURO: Alert and oriented x 3.  Resting tremor   IMPRESSION and PLAN:    1) FOBT positive stool -FOBT positive stool without overt GI blood loss.  Hemoglobin otherwise stable and actually improved from previous.  I had a long discussion today with the patient and his wife regarding colonoscopy for evaluation of FOBT positive stools.  They have significant reservations about proceeding with both the bowel preparation along with the sedation required for colonoscopy given his significant comorbidities.  While FOBT positive stools are typically worked up with colonoscopy, there are certainly other etiologies to trigger a false positive test, which we discussed today.  We additionally discussed that if a colon cancer was to be found, would he want to proceed with surgical intervention, chemotherapy, etc.  - They elected to go home and discuss further together and asked that in the interim I discuss with his PCM, Dr. [name], and Cardiologist, Dr. Harrington Challenger, which I am coordinating today. -To follow-up with me either by phone or in office to discuss -Alternatively, would be reasonable to  send for a Virtual Colonoscopy.  This does not obviate one of his wife's concerns of the bowel prep, but would reduce cardiopulmonary stress  2) Family history of colon cancer (mother, diagnosed in her 7s) 3) History of colon polyps -As above, considering the risks and benefits of colonoscopy -Typically shy away from virtual colonoscopy in patients with history of adenomatous polyps, but this case may present a reasonable exception  4) Early satiety 5) Nausea without emesis -Symptoms could certainly be related to chronic opioid use for pain.  As above, holding off on scheduling for endoscopic evaluation -Recommended low fat, low fiber diet -Recommend 6 small meals per day.  Reasonable to use Ensure, boost, etc. as a supplement -Briefly discussed trial of Reglan, but elected to hold off due to side effect profile and current polypharmacy.  Could consider GES pending clinical response  6) Chronic opioid-induced constipation -Seemingly relatively well controlled with Linzess  I spent 35 minutes of time, including in depth chart review, independent review of results as outlined above, communicating results with the patient directly, face-to-face time with the patient, coordinating care, and ordering studies and medications as appropriate, and documentation.           Lavena Bullion ,DO, FACG 09/10/2019, 2:05 PM

## 2019-09-10 NOTE — Patient Instructions (Signed)
Low-Fiber Eating Plan Fiber is found in fruits, vegetables, whole grains, and beans. Eating a diet low in fiber helps to reduce how often you have bowel movements and how much you produce during a bowel movement. A low-fiber eating plan may help your digestive system heal if:  You have certain conditions, such as Crohn's disease or diverticulitis.  You recently had radiation therapy on your pelvis or bowel.  You recently had intestinal surgery.  You have a new surgical opening in your abdomen (colostomy or ileostomy).  Your intestine is narrowed (stricture). Your health care provider will determine how long you need to stay on this diet. Your health care provider may recommend that you work with a diet and nutrition specialist (dietitian). What are tips for following this plan? General guidelines  Follow recommendations from your dietitian about how much fiber you should have each day.  Most people on this eating plan should try to eat less than 10 grams (g) of fiber each day. Your daily fiber goal is _________________ g.  Take vitamin and mineral supplements as told by your health care provider or dietitian. Chewable or liquid forms are best when on this eating plan. Reading food labels  Check food labels for the amount of dietary fiber.  Choose foods that have less than 2 grams of fiber in one serving. Cooking  Use white flour and other allowed grains for baking and cooking.  Cook meat using methods that keep it tender, such as braising or poaching.  Cook eggs until the yolk is completely solid.  Cook with healthy oils, such as olive oil or canola oil. Meal planning   Eat 5-6 small meals throughout the day instead of 3 large meals.  If you are lactose intolerant: ? Choose low-lactose dairy foods. ? Do not eat dairy foods, if told by your dietitian.  Limit fat and oils to less than 8 teaspoons a day.  Eat small portions of desserts. What foods are allowed? The items  listed below may not be a complete list. Talk with your dietitian about what dietary choices are best for you. Grains All bread and crackers made with white flour. Waffles, pancakes, and French toast. Bagels. Pretzels. Melba toast, zwieback, and matzoh. Cooked and dried cereals that do not contain whole grains, added fiber, seeds, or dried fruit. Cornmeal. [place]. Hot and cold cereals made with refined corn, wheat, rice, or oats. Plain pasta and noodles. White rice. Vegetables Well-cooked or canned vegetables without skin, seeds, or stems. Cooked potatoes without skins. Vegetable juice. Fruits Soft-cooked or canned fruits without skin and seeds. Peeled ripe banana. Applesauce. Fruit juice without pulp. Meats and other protein foods Ground meat. Tender cuts of meat or poultry. Eggs. Fish, seafood, and shellfish. Smooth nut butters. Tofu. Dairy All milk products and drinks. Lactose-free milks, including rice, soy, and almond milks. Yogurt without fruit, nuts, chocolate, or granola mix-ins. Sour cream. Cottage cheese. Cheese. Beverages Decaf coffee. Fruit and vegetable juices or smoothies (in small amounts, with no pulp or skins, and with fruits from allowed list). Sports drinks. Herbal tea. Fats and oils Olive oil, canola oil, sunflower oil, flaxseed oil, and grapeseed oil. Mayonnaise. Cream cheese. Margarine. Butter. Sweets and desserts Plain cakes and cookies. Cream pies and pies made with allowed fruits. Pudding. Custard. Fruit gelatin. Sherbet. Popsicles. Ice cream without nuts. Plain hard candy. Honey. Jelly. Molasses. Syrups, including chocolate syrup. Chocolate. Marshmallows. Gumdrops. Seasoning and other foods Bouillon. Broth. Cream soups made from allowed foods. Strained soup. Casseroles made   with allowed foods. Ketchup. Mild mustard. Mild salad dressings. Plain gravies. Vinegar. Spices in moderation. Salt. Sugar. What foods are not allowed? The items listed below may not be a complete  list. Talk with your dietitian about what dietary choices are best for you. Grains Whole wheat and whole grain breads and crackers. Multigrain breads and crackers. Rye bread. Whole grain or multigrain cereals. Cereals with nuts, raisins, or coconut. Bran. Coarse wheat cereals. Granola. High-fiber cereals. Cornmeal or corn bread. Whole grain pasta. Wild or brown rice. Quinoa. Popcorn. Buckwheat. Wheat germ. Vegetables Potato skins. Raw or undercooked vegetables. All beans and bean sprouts. Cooked greens. Corn. Peas. Cabbage. Beets. Broccoli. Brussels sprouts. Cauliflower. Mushrooms. Onions. Peppers. Parsnips. Okra. Sauerkraut. Fruit Raw or dried fruit. Berries. Fruit juice with pulp. Prune juice. Meats and other protein foods Tough, fibrous meats with gristle. Fatty meat. Poultry with skin. Fried meat, Sales executive, or fish. Deli or lunch meats. Sausage, bacon, and hot dogs. Nuts and chunky nut butter. Dried peas, beans, and lentils. Dairy Yogurt with fruit, nuts, chocolate, or granola mix-ins. Beverages Caffeinated coffee and teas. Fats and oils Avocado. Coconut. Sweets and desserts Desserts, cookies, or candies that contain nuts or coconut. Dried fruit. Jams and preserves with seeds. Marmalade. Any dessert made with fruits or grains that are not allowed. Seasoning and other foods Corn tortilla chips. Soups made with vegetables or grains that are not allowed. Relish. Horseradish. Angie Fava. Olives. Summary  Most people on a low-fiber eating plan should eat less than 10 grams of fiber a day. Follow recommendations from your dietitian about how much fiber you should have each day.  Always check food labels to see the dietary fiber content of packaged foods. In general, a low-fiber food will have fewer than 2 grams of fiber per serving.  In general, try to avoid whole grains, raw fruits and vegetables, dried fruit, tough cuts of meat, nuts, and seeds.  Take a vitamin and mineral supplement as told  by your health care provider or dietitian. This information is not intended to replace advice given to you by your health care provider. Make sure you discuss any questions you have with your health care provider. Document Revised: 09/07/2018 Document Reviewed: 07/19/2016 Elsevier Patient Education  [place] a low fat diet  Eat 6 small meals a day  It was a pleasure to see you today!  Vito Cirigliano, D.O.

## 2019-09-12 ENCOUNTER — Other Ambulatory Visit: Payer: Self-pay

## 2019-09-12 ENCOUNTER — Encounter: Payer: Self-pay | Admitting: Registered Nurse

## 2019-09-12 ENCOUNTER — Encounter: Payer: Medicare HMO | Attending: Physical Medicine & Rehabilitation | Admitting: Registered Nurse

## 2019-09-12 VITALS — BP 127/74 | HR 83 | Temp 97.5°F | Ht 66.0 in | Wt 178.0 lb

## 2019-09-12 DIAGNOSIS — M1712 Unilateral primary osteoarthritis, left knee: Secondary | ICD-10-CM | POA: Diagnosis not present

## 2019-09-12 DIAGNOSIS — Z5181 Encounter for therapeutic drug level monitoring: Secondary | ICD-10-CM | POA: Diagnosis not present

## 2019-09-12 DIAGNOSIS — M47816 Spondylosis without myelopathy or radiculopathy, lumbar region: Secondary | ICD-10-CM | POA: Insufficient documentation

## 2019-09-12 DIAGNOSIS — M25551 Pain in right hip: Secondary | ICD-10-CM | POA: Insufficient documentation

## 2019-09-12 DIAGNOSIS — I739 Peripheral vascular disease, unspecified: Secondary | ICD-10-CM | POA: Insufficient documentation

## 2019-09-12 DIAGNOSIS — G894 Chronic pain syndrome: Secondary | ICD-10-CM | POA: Diagnosis not present

## 2019-09-12 DIAGNOSIS — Z79891 Long term (current) use of opiate analgesic: Secondary | ICD-10-CM | POA: Diagnosis not present

## 2019-09-12 MED ORDER — OXYCODONE-ACETAMINOPHEN 7.5-325 MG PO TABS: 1.0000 | ORAL_TABLET | Freq: Three times a day (TID) | ORAL | 0 refills | Status: DC | PRN

## 2019-09-12 NOTE — Progress Notes (Signed)
Subjective:    Patient ID: Kevin [name], male    DOB: [birth year], 79 y.o.   MRN: [id number]  HPI: Kevin [name] is a 79 y.o. male who returns for follow up appointment for chronic pain and medication refill. He states his pain is located in his lower back pain and left knee pain. He  rates his pain 6. His current exercise regime is walking and performing stretching exercises.  Mr. Boulden Morphine equivalent is 32.81 MME.  Oral Swab was Performed today.    Pain Inventory Average Pain 7 Pain Right Now 6 My pain is aching  In the last 24 hours, has pain interfered with the following? General activity 7 Relation with others 7 Enjoyment of life 7 What TIME of day is your pain at its worst? daytime , evening Sleep (in general) Fair  Pain is worse with: walking, bending, sitting, inactivity, standing and some activites Pain improves with: medication Relief from Meds: 7  Mobility use a cane use a walker ability to climb steps?  no do you drive?  no Do you have any goals in this area?  no  Function retired  Neuro/Psych weakness trouble walking dizziness  Prior Studies Any changes since last visit?  no  Physicians involved in your care Any changes since last visit?  no   Family History  Problem Relation Age of Onset  . Parkinsonism Father   . Dementia Father   . Cancer Father        Throat  . Colon cancer Mother        died in 40s  . Cancer Mother        Colon   Social History   Socioeconomic History  . Marital status: Married    Spouse name: Not on file  . Number of children: Not on file  . Years of education: Not on file  . Highest education level: Not on file  Occupational History  . Occupation: retired    Comment: maintenance; electrician  Tobacco Use  . Smoking status: Former Smoker    Packs/day: 2.00    Years: 28.00    Pack years: 56.00    Types: Cigarettes    Quit date: 05/31/1987    Years since quitting: 32.3  . Smokeless tobacco: Never Used  .  Tobacco comment: quit smoking "in my 53's"  Substance and Sexual Activity  . Alcohol use: Yes    Alcohol/week: 0.0 standard drinks    Comment: rare, mikes hard lemonade  . Drug use: No    Types: Marijuana    Comment: hx of-4-5 yrs ago  . Sexual activity: Not Currently  Other Topics Concern  . Not on file  Social History Narrative   Married 33 years in 2015. Lived together for 12 years. 2 boys from first wife. 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       Hobbies: yardwork, [place] barn, woodwork, Orthoptist   Social Determinants of Health   Financial Resource Strain:   . Difficulty of Paying Living Expenses:   Food Insecurity:   . Worried About Charity fundraiser in the Last Year:   . Arboriculturist in the Last Year:   Transportation Needs:   . Film/video editor (Medical):   Marland Kitchen Lack of Transportation (Non-Medical):   Physical Activity:   .  Days of Exercise per Week:   . Minutes of Exercise per Session:   Stress:   . Feeling of Stress :   Social Connections:   . Frequency of Communication with Friends and Family:   . Frequency of Social Gatherings with Friends and Family:   . Attends Religious Services:   . Active Member of Clubs or Organizations:   . Attends Archivist Meetings:   Marland Kitchen Marital Status:    Past Surgical History:  Procedure Laterality Date  . ABDOMINAL AORTIC ANEURYSM REPAIR  2010  . CAROTID ENDARTERECTOMY Left 09/20/2004  . CATARACT EXTRACTION, BILATERAL     2016-2017  . COLONOSCOPY    . CORONARY ARTERY BYPASS GRAFT N/A 06/27/2018   Procedure: CORONARY ARTERY BYPASS GRAFTING (CABG), ON PUMP, TIMES FOUR, USING LEFT INTERNAL MAMMARY ARTERY AND ENDOSCOPICALLY HARVESTED LEFT GREATER SAPHENOUS VEIN;  Surgeon: Melrose Nakayama, MD;  Location: [place];  Service: Open Heart Surgery;  Laterality: N/A;  . ENDARTERECTOMY Right  10/12/2015   Procedure: RIGHT CAROTID ENDARTERECTOMY WITH PATCH ANGIOPLASTY;  Surgeon: Elam Dutch, MD;  Location: [place] [facility];  Service: Vascular;  Laterality: Right;  . INGUINAL HERNIA REPAIR Left 02/05/2013   Procedure: HERNIA REPAIR INGUINAL ADULT;  Surgeon: Joyice Faster. Cornett, MD;  Location: [place];  Service: General;  Laterality: Left;  . INGUINAL HERNIA REPAIR Left   . INSERTION OF MESH Left 02/05/2013   Procedure: INSERTION OF MESH;  Surgeon: Joyice Faster. Cornett, MD;  Location: [facility];  Service: General;  Laterality: Left;  . LEFT HEART CATH AND CORONARY ANGIOGRAPHY N/A 06/21/2018   Procedure: LEFT HEART CATH AND CORONARY ANGIOGRAPHY;  Surgeon: Jettie Booze, MD;  Location: [facility] [facility];  Service: Cardiovascular;  Laterality: N/A;  . SHOULDER ARTHROSCOPY WITH ROTATOR CUFF REPAIR AND SUBACROMIAL DECOMPRESSION Left 05/01/2014   Procedure: LEFT ARTHROSCOPY SHOULDER SUBACROMIAL DECOMPRESSION,DISTAL CLAVICAL RESECTION AND ROTATOR CUFF REPAIR;  Surgeon: Marin Shutter, MD;  Location: [place];  Service: Orthopedics;  Laterality: Left;  . TEE WITHOUT CARDIOVERSION N/A 06/27/2018   Procedure: TRANSESOPHAGEAL ECHOCARDIOGRAM (TEE);  Surgeon: Melrose Nakayama, MD;  Location: [facility];  Service: Open Heart Surgery;  Laterality: N/A;  . TESTICLAR CYST EXCISION Left   . TONSILLECTOMY    . VASECTOMY     Past Medical History:  Diagnosis Date  . Arthritis    DDD- Lower Back  . Bradycardia   . CAP (community acquired pneumonia) 03/17/2015   "THAT'S WHAT THEY ARE THINKING; THEY ARE NOT SURE"  . Carotid artery occlusion   . Chest pain 06/19/2018  . Chronic lower back pain    DDD  . CKD (chronic kidney disease)    Dr. Corliss Parish  . 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 Diverticulosis     . Enlarged prostate    takes Flomax daily  . History of blood transfusion    "probably when they did aortic aneurysm"  . History of colon polyps    benign  . Hyperlipidemia   . Hypertension    takes Amlodipine and Zebeta daily  . Insomnia    takes Melatonin nightly  . Joint pain   . 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     . Muscle spasm    takes Zanaflex daily as needed  .  Peripheral edema    takes Furosemide daily  . Pneumonia    hx of   . Rheumatic fever 1946  . Rheumatoid arthritis ([place])   . Short-term memory loss    minimal  . Shortness of breath dyspnea    with exertion but lasts very short period of time  . TIA (transient ischemic attack)    x 2;takes Plavix daily  . Urinary frequency    takes Flomax daily  . Urinary urgency    Temp (!) 97.5 F (36.4 C)   Ht 5\' 6"  (1.676 m)   Wt 178 lb (80.7 kg)   BMI 28.73 kg/m   Opioid Risk Score:   Fall Risk Score:  `1  Depression screen PHQ 2/9  Depression screen [facility] 2/9 05/09/2019 04/16/2019 04/16/2019 08/09/2018 04/16/2018 01/22/2018 10/19/2017  Decreased Interest 1 0 0 0 0 0 0  Down, Depressed, Hopeless 1 0 0 0 0 0 0  PHQ - 2 Score 2 0 0 0 0 0 0  Altered sleeping 0 - - - - - -  Tired, decreased energy 3 - - - - - -  Change in appetite 1 - - - - - -  Feeling bad or failure about yourself  1 - - - - - -  Trouble concentrating 1 - - - - - -  Moving slowly or fidgety/restless 0 - - - - - -  PHQ-9 Score 8 - - - - - -  Some recent data might be hidden    Review of Systems  Constitutional: Negative.   HENT: Negative.   Eyes: Negative.   Respiratory: Negative.   Cardiovascular: Negative.   Gastrointestinal: Negative.   Endocrine: Negative.   Genitourinary: Negative.   Musculoskeletal: Positive for arthralgias and back pain.  Allergic/Immunologic: Negative.   Neurological: Positive for dizziness, tremors and weakness.  Hematological: Negative.   Psychiatric/Behavioral: Negative.   All  other systems reviewed and are negative.      Objective:   Physical Exam Vitals and nursing note reviewed.  Constitutional:      Appearance: Normal appearance.  Cardiovascular:     Rate and Rhythm: Normal rate and regular rhythm.     Pulses: Normal pulses.     Heart sounds: Normal heart sounds.  Pulmonary:     Effort: Pulmonary effort is normal.     Breath sounds: Normal breath sounds.  Musculoskeletal:     Cervical back: Normal range of motion and neck supple.     Comments: Normal Muscle Bulk and Muscle Testing Reveals:  Upper Extremities: Full ROM and Muscle Strength 5/5  Lumbar Hypersensitivity Lower Extremities: Full ROM and Muscle Strength 5/5 Left Lower Extremity Flexion Produces Pain into her Left Patella Arises from Table Slowly using his cane for support Antalgic  Gait   Skin:    General: Skin is warm and dry.  Neurological:     Mental Status: He is alert and oriented to person, place, and time.  Psychiatric:        Mood and Affect: Mood normal.        Behavior: Behavior normal.           Assessment & Plan:  1. Lumbar Spondylosis/ Lumbar Stenosis:09/12/2019 Continue current medication regime. Refilled:Oxycodone 7.5/325 mg one tabletevery 8 hoursas needed #70.09/12/2019 We will continue the opioid monitoring program, this consists of regular clinic visits, examinations, urine drug screen, pill counts as well as use of [place] Controlled Substance Reporting System. 2.Bilateral Knees OA/ChronicBilateralKnee Pain:S/PBilateral KneeInjection by Dr  Paulla Fore on 02/26/2019, Orthopedist Following Dr. Smith.09/12/2019. 3.BilateralGreater Trochanter Bursitis:No complaints today.Orthopedist Following.Continue to alternate Ice and Heat Therapy.09/12/2019 F/U in 1 month

## 2019-09-15 LAB — DRUG TOX MONITOR 1 W/CONF, ORAL FLD
Alprazolam: NEGATIVE ng/mL (ref <0.50)
Amphetamines: NEGATIVE ng/mL (ref <10)
Barbiturates: NEGATIVE ng/mL (ref <10)
Benzodiazepines: POSITIVE ng/mL — AB (ref <0.50)
Buprenorphine: NEGATIVE ng/mL (ref <0.10)
Chlordiazepoxide: NEGATIVE ng/mL (ref <0.50)
Clonazepam: NEGATIVE ng/mL (ref <0.50)
Cocaine: NEGATIVE ng/mL (ref <5.0)
Codeine: NEGATIVE ng/mL (ref <2.5)
Cotinine: 15.4 ng/mL — ABNORMAL HIGH (ref <5.0)
Diazepam: 1.95 ng/mL — ABNORMAL HIGH (ref <0.50)
Dihydrocodeine: NEGATIVE ng/mL (ref <2.5)
Fentanyl: NEGATIVE ng/mL (ref <0.10)
Flunitrazepam: NEGATIVE ng/mL (ref <0.50)
Flurazepam: NEGATIVE ng/mL (ref <0.50)
Heroin Metabolite: NEGATIVE ng/mL (ref <1.0)
Hydrocodone: NEGATIVE ng/mL (ref <2.5)
Hydromorphone: NEGATIVE ng/mL (ref <2.5)
Lorazepam: NEGATIVE ng/mL (ref <0.50)
MARIJUANA: NEGATIVE ng/mL (ref <2.5)
MDMA: NEGATIVE ng/mL (ref <10)
Meprobamate: NEGATIVE ng/mL (ref <2.5)
Methadone: NEGATIVE ng/mL (ref <5.0)
Midazolam: NEGATIVE ng/mL (ref <0.50)
Morphine: NEGATIVE ng/mL (ref <2.5)
Nicotine Metabolite: POSITIVE ng/mL — AB (ref <5.0)
Nordiazepam: 3.63 ng/mL — ABNORMAL HIGH (ref <0.50)
Norhydrocodone: NEGATIVE ng/mL (ref <2.5)
Noroxycodone: 5.1 ng/mL — ABNORMAL HIGH (ref <2.5)
Opiates: POSITIVE ng/mL — AB (ref <2.5)
Oxazepam: NEGATIVE ng/mL (ref <0.50)
Oxycodone: 33.9 ng/mL — ABNORMAL HIGH (ref <2.5)
Oxymorphone: NEGATIVE ng/mL (ref <2.5)
Phencyclidine: NEGATIVE ng/mL (ref <10)
Tapentadol: NEGATIVE ng/mL (ref <5.0)
Temazepam: NEGATIVE ng/mL (ref <0.50)
Tramadol: NEGATIVE ng/mL (ref <5.0)
Triazolam: NEGATIVE ng/mL (ref <0.50)
Zolpidem: NEGATIVE ng/mL (ref <5.0)

## 2019-09-15 LAB — DRUG TOX ALC METAB W/CON, ORAL FLD: Alcohol Metabolite: NEGATIVE ng/mL (ref <25)

## 2019-09-16 ENCOUNTER — Telehealth: Payer: Self-pay | Admitting: *Deleted

## 2019-09-16 NOTE — Telephone Encounter (Signed)
Oral swab drug screen was consistent for prescribed medications.  ?

## 2019-09-23 ENCOUNTER — Encounter: Payer: Self-pay | Admitting: *Deleted

## 2019-09-23 NOTE — Telephone Encounter (Signed)
Opened in error

## 2019-09-28 ENCOUNTER — Other Ambulatory Visit: Payer: Self-pay | Admitting: Family Medicine

## 2019-10-04 ENCOUNTER — Other Ambulatory Visit: Payer: Self-pay | Admitting: Family Medicine

## 2019-10-08 ENCOUNTER — Ambulatory Visit: Payer: Medicare HMO | Attending: Internal Medicine

## 2019-10-08 ENCOUNTER — Ambulatory Visit (INDEPENDENT_AMBULATORY_CARE_PROVIDER_SITE_OTHER): Payer: Medicare HMO

## 2019-10-08 ENCOUNTER — Other Ambulatory Visit: Payer: Self-pay

## 2019-10-08 ENCOUNTER — Encounter: Payer: Self-pay | Admitting: Family Medicine

## 2019-10-08 ENCOUNTER — Ambulatory Visit: Payer: Medicare HMO | Admitting: Family Medicine

## 2019-10-08 VITALS — BP 118/74 | HR 80 | Ht 66.0 in | Wt 170.0 lb

## 2019-10-08 DIAGNOSIS — M2550 Pain in unspecified joint: Secondary | ICD-10-CM | POA: Diagnosis not present

## 2019-10-08 DIAGNOSIS — R634 Abnormal weight loss: Secondary | ICD-10-CM | POA: Diagnosis not present

## 2019-10-08 DIAGNOSIS — R079 Chest pain, unspecified: Secondary | ICD-10-CM

## 2019-10-08 DIAGNOSIS — M170 Bilateral primary osteoarthritis of knee: Secondary | ICD-10-CM | POA: Diagnosis not present

## 2019-10-08 DIAGNOSIS — R7989 Other specified abnormal findings of blood chemistry: Secondary | ICD-10-CM | POA: Diagnosis not present

## 2019-10-08 DIAGNOSIS — R109 Unspecified abdominal pain: Secondary | ICD-10-CM | POA: Diagnosis not present

## 2019-10-08 DIAGNOSIS — Z23 Encounter for immunization: Secondary | ICD-10-CM

## 2019-10-08 DIAGNOSIS — Z800 Family history of malignant neoplasm of digestive organs: Secondary | ICD-10-CM | POA: Diagnosis not present

## 2019-10-08 LAB — COMPREHENSIVE METABOLIC PANEL
ALT: 16 U/L (ref 0–53)
AST: 27 U/L (ref 0–37)
Albumin: 3.9 g/dL (ref 3.5–5.2)
Alkaline Phosphatase: 33 U/L — ABNORMAL LOW (ref 39–117)
BUN: 19 mg/dL (ref 6–23)
CO2: 29 mEq/L (ref 19–32)
Calcium: 9.1 mg/dL (ref 8.4–10.5)
Chloride: 99 mEq/L (ref 96–112)
Creatinine, Ser: 2.1 mg/dL — ABNORMAL HIGH (ref 0.40–1.50)
GFR: 30.48 mL/min — ABNORMAL LOW (ref >60.00)
Glucose, Bld: 111 mg/dL — ABNORMAL HIGH (ref 70–99)
Potassium: 3.4 mEq/L — ABNORMAL LOW (ref 3.5–5.1)
Sodium: 137 mEq/L (ref 135–145)
Total Bilirubin: 0.9 mg/dL (ref 0.2–1.2)
Total Protein: 7 g/dL (ref 6.0–8.3)

## 2019-10-08 LAB — C-REACTIVE PROTEIN: CRP: 1.7 mg/dL (ref 0.5–20.0)

## 2019-10-08 LAB — SEDIMENTATION RATE: Sed Rate: 16 mm/hr (ref 0–20)

## 2019-10-08 MED ORDER — MIRTAZAPINE 7.5 MG PO TABS: 7.5000 mg | ORAL_TABLET | Freq: Every day | ORAL | 0 refills | Status: DC

## 2019-10-08 NOTE — Assessment & Plan Note (Signed)
Chronic problem with exacerbation.  Left knee injected today.  Tolerated the procedure well.  With patient now having heme positive stool with a 17 pound weight loss encourage patient to follow-up with gastroenterology to see if this would need further be evaluated with a colonoscopy.  Will also send note to patient's primary care doctor.  Patient has many different comorbidities also noted including coronary artery disease and chronic kidney disease.  Makes it difficult to treat patient appropriately.  We discussed medication management including the gabapentin that we have given him previously as well as now given him Remeron secondary to the weight loss.

## 2019-10-08 NOTE — Patient Instructions (Addendum)
Chest xray today Labs CEA, ESR, CRP, AFP, CMP, Ace, PTH, ICa, LDH Injected knee today I will send Dr. [name] a message Remeron 7.5 mg nightly stop melatonin See me again in 8 weeks

## 2019-10-08 NOTE — Assessment & Plan Note (Signed)
17 pound weight loss.  Decreased appetite.  Started Remeron.  Laboratory work-up ordered today.  Will send note to primary care provider if anything else is necessary.  History of smoking may need CT scan.

## 2019-10-08 NOTE — Progress Notes (Addendum)
[place] Phone: [phone] Subjective:   Kevin [name], am serving as a scribe for Dr. Hulan Saas. This visit occurred during the SARS-CoV-2 public health emergency.  Safety protocols were in place, including screening questions prior to the visit, additional usage of staff PPE, and extensive cleaning of exam room while observing appropriate contact time as indicated for disinfecting solutions.   I'm seeing this patient by the request  of:  Marin Olp, MD  CC: Left pontine  DPO:[id number]   07/30/2019 Chronic problem with exacerbation, due to patient's other comorbidities this changes social determinants of health.  At this point this seems to be the safest option for this individual.  Discussed with patient about continuing to stay active, encouraged the idea of potential formal physical therapy.  Follow-up with me again in 4 to 8 weeks  Concern for possible worsening of the arthritic changes.  She was ordered today to see if there has been any progression.  Last MRI moderate for mild foraminal narrowing at L3-L4 that was independently visualized by me.  Moderate to severe bilateral foraminal narrowing at L4-L5.  Multiple epidurals have been done previously.  Believe the patient continues to have some peripheral artery disease.  Patient continues to be tested yearly.  Change to Pletal to see if this is per contributing to some of it.  Warned of potential side effects.  Patient is continue on the amlodipine.  Follow-up with me in 4 to 8 weeks  Update 10/08/2019 Kevin [name] is a 79 y.o. male coming in with complaint of bilateral knee pain and back pain. Patient states that his back pain is intermittent. Left knee pain has been increasing since last visit. Hip pain has subsided.  Patient has had significant difficulty recently.  Reviewed patient's chart in great detail and took greater than 40 minutes before  appointment this was on the morning of the appointment (5/11).  Has had fecal occult blood test positive.  Did see GI who did discuss the possibility of a colonoscopy but did not think it would change management significantly.  Patient though since we have seen patient has also lost 17 pounds.  Patient is accompanied with wife who states that it is not seeming to be very active at the moment and is not being very stimulated by food to eat.     Past Medical History:  Diagnosis Date  . Arthritis    DDD- Lower Back  . Bradycardia   . CAP (community acquired pneumonia) 03/17/2015   "THAT'S WHAT THEY ARE THINKING; THEY ARE NOT SURE"  . Carotid artery occlusion   . Chest pain 06/19/2018  . Chronic lower back pain    DDD  . CKD (chronic kidney disease)    Dr. Corliss Parish  . 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 Diverticulosis   . Enlarged prostate    takes Flomax daily  . History of blood transfusion    "probably when they did aortic aneurysm"  . History of colon polyps    benign  . Hyperlipidemia   . Hypertension    takes Amlodipine and Zebeta daily  . Insomnia    takes Melatonin nightly  . Joint pain   . 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     . Muscle spasm    takes Zanaflex daily as needed  . Peripheral edema    takes Furosemide daily  . Pneumonia    hx of   . Rheumatic fever 1946  . Rheumatoid arthritis ([place])   . Short-term memory loss    minimal  . Shortness of breath dyspnea    with exertion but lasts very short period of time  . TIA (transient ischemic attack)    x 2;takes Plavix daily  . Urinary frequency    takes Flomax daily  . Urinary urgency    Past Surgical History:  Procedure Laterality Date  . ABDOMINAL AORTIC ANEURYSM REPAIR  2010  . CAROTID ENDARTERECTOMY  Left 09/20/2004  . CATARACT EXTRACTION, BILATERAL     2016-2017  . COLONOSCOPY    . CORONARY ARTERY BYPASS GRAFT N/A 06/27/2018   Procedure: CORONARY ARTERY BYPASS GRAFTING (CABG), ON PUMP, TIMES FOUR, USING LEFT INTERNAL MAMMARY ARTERY AND ENDOSCOPICALLY HARVESTED LEFT GREATER SAPHENOUS VEIN;  Surgeon: Melrose Nakayama, MD;  Location: [place];  Service: Open Heart Surgery;  Laterality: N/A;  . ENDARTERECTOMY Right 10/12/2015   Procedure: RIGHT CAROTID ENDARTERECTOMY WITH PATCH ANGIOPLASTY;  Surgeon: Elam Dutch, MD;  Location: [place];  Service: Vascular;  Laterality: Right;  . INGUINAL HERNIA REPAIR Left 02/05/2013   Procedure: HERNIA REPAIR INGUINAL ADULT;  Surgeon: Joyice Faster. Cornett, MD;  Location: [place];  Service: General;  Laterality: Left;  . INGUINAL HERNIA REPAIR Left   . INSERTION OF MESH Left 02/05/2013   Procedure: INSERTION OF MESH;  Surgeon: Joyice Faster. Cornett, MD;  Location: [facility];  Service: General;  Laterality: Left;  . LEFT HEART CATH AND CORONARY ANGIOGRAPHY N/A 06/21/2018   Procedure: LEFT HEART CATH AND CORONARY ANGIOGRAPHY;  Surgeon: Jettie Booze, MD;  Location: [place] [facility];  Service: Cardiovascular;  Laterality: N/A;  . SHOULDER ARTHROSCOPY WITH ROTATOR CUFF REPAIR AND SUBACROMIAL DECOMPRESSION Left 05/01/2014   Procedure: LEFT ARTHROSCOPY SHOULDER SUBACROMIAL DECOMPRESSION,DISTAL CLAVICAL RESECTION AND ROTATOR CUFF REPAIR;  Surgeon: Marin Shutter, MD;  Location: [place];  Service: Orthopedics;  Laterality: Left;  . TEE WITHOUT CARDIOVERSION N/A 06/27/2018   Procedure: TRANSESOPHAGEAL ECHOCARDIOGRAM (TEE);  Surgeon: Melrose Nakayama, MD;  Location: [place];  Service: Open Heart Surgery;  Laterality: N/A;  . TESTICLAR CYST EXCISION Left   . TONSILLECTOMY    . VASECTOMY     Social History   Socioeconomic History  . Marital status: Married    Spouse name: Not on file  . Number of children: Not on file  . Years of  education: Not on file  . Highest education level: Not on file  Occupational History  . Occupation: retired    Comment: maintenance; electrician  Tobacco Use  . Smoking status: Former Smoker    Packs/day: 2.00    Years: 28.00    Pack years: 56.00    Types: Cigarettes    Quit date: 05/31/1987    Years since quitting: 32.3  . Smokeless tobacco: Never Used  . Tobacco comment: quit smoking "in my 69's"  Substance and Sexual Activity  . Alcohol use: Yes    Alcohol/week: 0.0 standard drinks    Comment: rare, mikes hard lemonade  . Drug use: [name]    Types: Marijuana    Comment: hx of-4-5 yrs ago  . Sexual activity: Not Currently  Other Topics Concern  . Not on file  Social History Narrative   Married  33 years in 2015. Lived together for 12 years. 2 boys from first wife. 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       Hobbies: yardwork, [place] barn, woodwork, Orthoptist   Social Determinants of Health   Financial Resource Strain:   . Difficulty of Paying Living Expenses:   Food Insecurity:   . Worried About Charity fundraiser in the Last Year:   . Arboriculturist in the Last Year:   Transportation Needs:   . Film/video editor (Medical):   Marland Kitchen Lack of Transportation (Non-Medical):   Physical Activity:   . Days of Exercise per Week:   . Minutes of Exercise per Session:   Stress:   . Feeling of Stress :   Social Connections:   . Frequency of Communication with Friends and Family:   . Frequency of Social Gatherings with Friends and Family:   . Attends Religious Services:   . Active Member of Clubs or Organizations:   . Attends Archivist Meetings:   Marland Kitchen Marital Status:    Allergies  Allergen Reactions  . Hydralazine Other (See Comments)    Pt stated he feels drunk   Family History  Problem Relation Age of Onset  . Parkinsonism Father   .  Dementia Father   . Cancer Father        Throat  . Colon cancer Mother        died in 60s  . Cancer Mother        Colon     Current Outpatient Medications (Cardiovascular):  .  amLODipine (NORVASC) 10 MG tablet, TAKE 1 TABLET EVERY DAY .  atorvastatin (LIPITOR) 20 MG tablet, TAKE 1 TABLET EVERY DAY .  fenofibrate 160 MG tablet, Take 1 tablet (160 mg total) by mouth daily. .  furosemide (LASIX) 20 MG tablet, TAKE 1 TABLET EVERY DAY .  metoprolol tartrate (LOPRESSOR) 25 MG tablet, Take 1 tablet (25 mg total) by mouth 2 (two) times daily. .  rosuvastatin (CRESTOR) 40 MG tablet, Take 1 tablet (40 mg total) by mouth daily.  Current Outpatient Medications (Respiratory):  .  albuterol (VENTOLIN HFA) 108 (90 Base) MCG/ACT inhaler, Inhale 2 puffs into the lungs every 12 (twelve) hours as needed for wheezing or shortness of breath.  Current Outpatient Medications (Analgesics):  .  aspirin EC 81 MG EC tablet, Take 1 tablet (81 mg total) by mouth daily. Marland Kitchen  oxyCODONE-acetaminophen (PERCOCET) 7.5-325 MG tablet, Take 1 tablet by mouth every 8 (eight) hours as needed for severe pain.  Current Outpatient Medications (Hematological):  .  cilostazol (PLETAL) 50 MG tablet, Take 1 tablet by mouth twice daily .  vitamin B-12 (CYANOCOBALAMIN) 1000 MCG tablet, Take 1,000 mcg by mouth daily.  Current Outpatient Medications (Other):  .  BIOTIN PO, Take 1 tablet by mouth daily. .  Cholecalciferol (VITAMIN D-3) 1000 UNITS CAPS, Take 1,000 Units by mouth daily.  .  diazepam (VALIUM) 5 MG tablet, TAKE 1/2 TO 1 TABLET q8 hours PRN pain sleep/muscle spasm-only if wife is around+can watch you closely when taking due to sedation risk .  doxycycline (VIBRAMYCIN) 100 MG capsule, Take 1 capsule (100 mg total) by mouth 2 (two) times daily. Marland Kitchen  gabapentin (NEURONTIN) 400 MG capsule, Take 1 capsule (400 mg total) by mouth 2 (two) times daily. 400 mg twice  a day .  linaclotide (LINZESS) 145 MCG CAPS capsule, TAKE 1 CAPSULE  BY MOUTH ONCE DAILY BEFORE BREAKFAST .  Melatonin 10 MG TABS, Take 10 mg by mouth at bedtime.  .  mirtazapine (REMERON) 7.5 MG tablet, Take 1 tablet (7.5 mg total) by mouth at bedtime. .  tamsulosin (FLOMAX) 0.4 MG CAPS capsule, Take 1 capsule (0.4 mg total) by mouth daily. Marland Kitchen  tiZANidine (ZANAFLEX) 4 MG tablet, TAKE 1 TABLET EVERY 6 HOURS AS NEEDED FOR MUSCLE SPASMS   Reviewed prior external information including notes and imaging from  primary care provider As well as notes that were available from care everywhere and other healthcare systems.  Past medical history, social, surgical and family history all reviewed in electronic medical record.  [name] pertanent information unless stated regarding to the chief complaint.   Review of Systems:  [name] headache, visual changes, nausea, vomiting, diarrhea, constipation, dizziness, abdominal pain, skin rash, fevers, chills, night sweats,, swollen lymph nodes, body aches, joint swelling, chest pain, shortness of breath, mood changes. POSITIVE muscle aches, body aches, weight loss  Objective  Blood pressure 118/74, pulse 80, height 5\' 6"  (1.676 m), weight 170 lb (77.1 kg), SpO2 94 %.   General: More of a masked face than usual.  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  Patient has lost significant weight since we have seen patient. HEENT: Pupils equal, extraocular movements intact  Respiratory: Patient's speak in full sentences and does not appear short of breath  Cardiovascular: [name] lower extremity edema, non tender, [name] erythema  Neuro: Very mild cogwheeling noted Gait severely antalgic favoring the left knee using a cane MSK:   Knee: Left valgus deformity noted.  Abnormal thigh to calf ratio.  Tender to palpation over medial and PF joint line.  ROM full in flexion and extension and lower leg rotation. instability with valgus force.  painful patellar compression. Patellar glide with moderate crepitus. Patellar  and quadriceps tendons unremarkable. Hamstring and quadriceps strength is normal. Contralateral knee shows minimal tenderness today but does have instability.  After informed written and verbal consent, patient was seated on exam table. Left knee was prepped with alcohol swab and utilizing anterolateral approach, patient's left knee space was injected with 4:1  marcaine 0.5%: Kenalog 40mg /dL. Patient tolerated the procedure well without immediate complications.   Impression and Recommendations:     This case required medical decision making of moderate complexity. The above documentation has been reviewed and is accurate and complete Lyndal Pulley, DO       Note: This dictation was prepared with Dragon dictation along with smaller phrase technology. Any transcriptional errors that result from this process are unintentional.

## 2019-10-08 NOTE — Progress Notes (Signed)
   Covid-19 Vaccination Clinic  Name:  Kevin [name]    MRN: [id number] DOB: Jan 21, 1941  10/08/2019  [name] was observed post Covid-19 immunization for 15 minutes without incident. He was provided with Vaccine Information Sheet and instruction to access the V-Safe system.   Mr. Pop was instructed to call 911 with any severe reactions post vaccine: Marland Kitchen Difficulty breathing  . Swelling of face and throat  . A fast heartbeat  . A bad rash all over body  . Dizziness and weakness   Immunizations Administered    Name Date Dose VIS Date Route   Pfizer COVID-19 Vaccine 10/08/2019  3:12 PM 0.3 mL 07/24/2018 Intramuscular   Manufacturer: [employer], [employer]   Lot: [id number]   [place]: [id number]

## 2019-10-09 ENCOUNTER — Other Ambulatory Visit: Payer: Self-pay | Admitting: Family Medicine

## 2019-10-09 LAB — PTH, INTACT AND CALCIUM
Calcium: 9.1 mg/dL (ref 8.6–10.3)
PTH: 194 pg/mL — ABNORMAL HIGH (ref 14–64)

## 2019-10-09 LAB — CEA: CEA: 1.7 ng/mL

## 2019-10-09 LAB — LACTATE DEHYDROGENASE: LDH: 235 U/L (ref 120–250)

## 2019-10-09 LAB — CALCIUM, IONIZED: Calcium, Ion: 4.9 mg/dL (ref 4.8–5.6)

## 2019-10-09 LAB — AFP TUMOR MARKER: AFP-Tumor Marker: 1.8 ng/mL (ref <6.1)

## 2019-10-09 LAB — ANGIOTENSIN CONVERTING ENZYME: Angiotensin-Converting Enzyme: 38 U/L (ref 9–67)

## 2019-10-10 NOTE — Telephone Encounter (Signed)
Last OV 07/08/19 Last refill 09/05/19 #90/1 Next OV not scheduled

## 2019-10-11 ENCOUNTER — Ambulatory Visit: Payer: Medicare HMO | Admitting: Physician Assistant

## 2019-10-14 ENCOUNTER — Telehealth: Payer: Self-pay | Admitting: Family Medicine

## 2019-10-14 ENCOUNTER — Encounter: Payer: Medicare HMO | Admitting: Registered Nurse

## 2019-10-14 NOTE — Telephone Encounter (Signed)
Called patient to discuss recommendations.

## 2019-10-14 NOTE — Telephone Encounter (Signed)
Pt wife called  [name] [phone]), thinks pt is having a reaction to the Remeron. Took at night Fri/Sat/Sun and pt unable to walk, fell and she was unable to get him up. She is concerned this is too much with his other medicines and is unsure what to do.

## 2019-10-15 ENCOUNTER — Other Ambulatory Visit: Payer: Self-pay

## 2019-10-15 ENCOUNTER — Encounter: Payer: Self-pay | Admitting: Registered Nurse

## 2019-10-15 ENCOUNTER — Encounter: Payer: Medicare HMO | Attending: Physical Medicine & Rehabilitation | Admitting: Registered Nurse

## 2019-10-15 VITALS — BP 125/69 | HR 74 | Ht 67.0 in | Wt 170.0 lb

## 2019-10-15 DIAGNOSIS — I739 Peripheral vascular disease, unspecified: Secondary | ICD-10-CM | POA: Insufficient documentation

## 2019-10-15 DIAGNOSIS — M1712 Unilateral primary osteoarthritis, left knee: Secondary | ICD-10-CM | POA: Diagnosis not present

## 2019-10-15 DIAGNOSIS — Z5181 Encounter for therapeutic drug level monitoring: Secondary | ICD-10-CM | POA: Diagnosis not present

## 2019-10-15 DIAGNOSIS — W19XXXA Unspecified fall, initial encounter: Secondary | ICD-10-CM | POA: Diagnosis not present

## 2019-10-15 DIAGNOSIS — G894 Chronic pain syndrome: Secondary | ICD-10-CM | POA: Insufficient documentation

## 2019-10-15 DIAGNOSIS — Z79891 Long term (current) use of opiate analgesic: Secondary | ICD-10-CM | POA: Insufficient documentation

## 2019-10-15 DIAGNOSIS — Y92009 Unspecified place in unspecified non-institutional (private) residence as the place of occurrence of the external cause: Secondary | ICD-10-CM | POA: Diagnosis not present

## 2019-10-15 DIAGNOSIS — M47816 Spondylosis without myelopathy or radiculopathy, lumbar region: Secondary | ICD-10-CM | POA: Diagnosis not present

## 2019-10-15 DIAGNOSIS — M25551 Pain in right hip: Secondary | ICD-10-CM | POA: Insufficient documentation

## 2019-10-15 MED ORDER — OXYCODONE-ACETAMINOPHEN 7.5-325 MG PO TABS: 1.0000 | ORAL_TABLET | Freq: Three times a day (TID) | ORAL | 0 refills | Status: DC | PRN

## 2019-10-15 NOTE — Progress Notes (Signed)
Subjective:    Patient ID: Kevin [name], male    DOB: 01-28-1941, 79 y.o.   MRN: [id number]  HPI: Kevin [name] is a 79 y.o. male whose appointment was changed to a tele-heath visit. Mrs. Kryder called office yesterday asking for a tele-health visit due to falls over the weekend. Mrs. Klima reports Kevin [name] was placed on a new medication by his PCP and he developed  Two near miss falls and had a fall and landed on his buttocks. She  ( Mrs. Charise Carwin Kevin [name] hitting his head. Kevin [name] reports buttock pain post fall. They were educated on Falls Prevention and verbalizes understanding. Mrs. Mastel reports the new medication was discontinued.   Mrs. Lograsso states she dispenses Kevin [name] medications.   He states his  pain is located in his lower back and buttocks ( S/P Fall). He rates his pain 5. His current exercise regime is walking in the home with walker.   Kevin [name] Morphine equivalent is 32.81 MME.  He is also prescribed Diazepam by PCP. We have discussed the black box warning of using opioids and benzodiazepines. I highlighted the dangers of using these drugs together and discussed the adverse events including respiratory suppression, overdose, cognitive impairment and importance of compliance with current regimen. We will continue to monitor and adjust as indicated.   Last Oral Swab was performed on 09/12/2019, it was consistent.    Pain Inventory Average Pain 5 Pain Right Now 5 My pain is intermittent, constant, stabbing and aching  In the last 24 hours, has pain interfered with the following? General activity 0 Relation with others 0 Enjoyment of life 0 What TIME of day is your pain at its worst? afternoon Sleep (in general) Good  Pain is worse with: walking, bending, sitting, inactivity, standing and some activites Pain improves with: medication and injections Relief from Meds: 5  Mobility walk with assistance use a walker ability to climb steps?  no do you drive?   no  Function retired  Neuro/Psych weakness tremor trouble walking spasms dizziness confusion  Prior Studies Any changes since last visit?  no  Physicians involved in your care Any changes since last visit?  no   Family History  Problem Relation Age of Onset  . Parkinsonism Father   . Dementia Father   . Cancer Father        Throat  . Colon cancer Mother        died in 62s  . Cancer Mother        Colon   Social History   Socioeconomic History  . Marital status: Married    Spouse name: Not on file  . Number of children: Not on file  . Years of education: Not on file  . Highest education level: Not on file  Occupational History  . Occupation: retired    Comment: maintenance; electrician  Tobacco Use  . Smoking status: Former Smoker    Packs/day: 2.00    Years: 28.00    Pack years: 56.00    Types: Cigarettes    Quit date: 05/31/1987    Years since quitting: 32.3  . Smokeless tobacco: Never Used  . Tobacco comment: quit smoking "in my 49's"  Substance and Sexual Activity  . Alcohol use: Yes    Alcohol/week: 0.0 standard drinks    Comment: rare, mikes hard lemonade  . Drug use: No    Types: Marijuana    Comment: hx of-4-5 yrs ago  . Sexual activity:  Not Currently  Other Topics Concern  . Not on file  Social History Narrative   Married 33 years in 2015. Lived together for 12 years. 2 boys from first wife. 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       Hobbies: yardwork, [place] barn, woodwork, Orthoptist   Social Determinants of Health   Financial Resource Strain:   . Difficulty of Paying Living Expenses:   Food Insecurity:   . Worried About Charity fundraiser in the Last Year:   . Arboriculturist in the Last Year:   Transportation Needs:   . Film/video editor (Medical):   Marland Kitchen Lack of Transportation (Non-Medical):   Physical  Activity:   . Days of Exercise per Week:   . Minutes of Exercise per Session:   Stress:   . Feeling of Stress :   Social Connections:   . Frequency of Communication with Friends and Family:   . Frequency of Social Gatherings with Friends and Family:   . Attends Religious Services:   . Active Member of Clubs or Organizations:   . Attends Archivist Meetings:   Marland Kitchen Marital Status:    Past Surgical History:  Procedure Laterality Date  . ABDOMINAL AORTIC ANEURYSM REPAIR  2010  . CAROTID ENDARTERECTOMY Left 09/20/2004  . CATARACT EXTRACTION, BILATERAL     2016-2017  . COLONOSCOPY    . 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 ENDARTERECTOMY Right 10/12/2015   Procedure: RIGHT CAROTID ENDARTERECTOMY WITH PATCH ANGIOPLASTY;  Surgeon: Elam Dutch, MD;  Location: [facility];  Service: Vascular;  Laterality: Right;  . INGUINAL HERNIA REPAIR Left 02/05/2013   Procedure: HERNIA REPAIR INGUINAL ADULT;  Surgeon: Joyice Faster. Cornett, MD;  Location: [facility];  Service: General;  Laterality: Left;  . INGUINAL HERNIA REPAIR Left   . INSERTION OF MESH Left 02/05/2013   Procedure: INSERTION OF MESH;  Surgeon: Joyice Faster. Cornett, MD;  Location: [facility];  Service: General;  Laterality: Left;  . LEFT HEART CATH AND CORONARY ANGIOGRAPHY N/A 06/21/2018   Procedure: LEFT HEART CATH AND CORONARY ANGIOGRAPHY;  Surgeon: Jettie Booze, MD;  Location: [facility];  Service: Cardiovascular;  Laterality: N/A;  . SHOULDER ARTHROSCOPY WITH ROTATOR CUFF REPAIR AND SUBACROMIAL DECOMPRESSION Left 05/01/2014   Procedure: LEFT ARTHROSCOPY SHOULDER SUBACROMIAL DECOMPRESSION,DISTAL CLAVICAL RESECTION AND ROTATOR  CUFF REPAIR;  Surgeon: Marin Shutter, MD;  Location: [facility];  Service: Orthopedics;  Laterality: Left;  . TEE WITHOUT CARDIOVERSION N/A 06/27/2018   Procedure: TRANSESOPHAGEAL ECHOCARDIOGRAM (TEE);  Surgeon: Melrose Nakayama, MD;  Location: [facility];  Service: Open Heart Surgery;  Laterality: N/A;  . TESTICLAR CYST EXCISION Left   . TONSILLECTOMY    . VASECTOMY     Past Medical History:  Diagnosis Date  . Arthritis    DDD- Lower Back  . Bradycardia   . CAP (community acquired pneumonia) 03/17/2015   "THAT'S WHAT THEY ARE THINKING; THEY ARE NOT SURE"  . Carotid artery occlusion   . Chest pain 06/19/2018  . Chronic lower back  pain    DDD  . CKD (chronic kidney disease)    Dr. Corliss Parish  . 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 Diverticulosis   . Enlarged prostate    takes Flomax daily  . History of blood transfusion    "probably when they did aortic aneurysm"  . History of colon polyps    benign  . Hyperlipidemia   . Hypertension    takes Amlodipine and Zebeta daily  . Insomnia    takes Melatonin nightly  . Joint pain   . 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     . Muscle spasm    takes Zanaflex daily as needed  . Peripheral edema    takes Furosemide daily  . Pneumonia    hx of   . Rheumatic fever 1946  . Rheumatoid arthritis ([place])   . Short-term memory loss    minimal  . Shortness of breath dyspnea    with exertion but lasts very short period of time  . TIA (transient ischemic attack)    x 2;takes Plavix daily  . Urinary frequency    takes Flomax daily  . Urinary urgency    BP 125/69   Pulse 74   Ht 5\' 7"  (1.702 m)   Wt 170 lb (77.1 kg)   SpO2 94%   BMI 26.63 kg/m   Opioid Risk Score:   Fall Risk Score:  `1  Depression screen PHQ 2/9  Depression screen  [facility] 2/9 05/09/2019 04/16/2019 04/16/2019 08/09/2018 04/16/2018 01/22/2018 10/19/2017  Decreased Interest 1 0 0 0 0 0 0  Down, Depressed, Hopeless 1 0 0 0 0 0 0  PHQ - 2 Score 2 0 0 0 0 0 0  Altered sleeping 0 - - - - - -  Tired, decreased energy 3 - - - - - -  Change in appetite 1 - - - - - -  Feeling bad or failure about yourself  1 - - - - - -  Trouble concentrating 1 - - - - - -  Moving slowly or fidgety/restless 0 - - - - - -  PHQ-9 Score 8 - - - - - -  Some recent data might be hidden    Review of Systems  Constitutional: Negative.   HENT: Negative.   Eyes: Negative.   Respiratory: Negative.   Cardiovascular: Negative.   Gastrointestinal: Negative.   Endocrine: Negative.   Genitourinary: Negative.   Musculoskeletal: Positive for arthralgias, back pain and gait problem.  Skin: Negative.   Allergic/Immunologic: Negative.   Neurological: Positive for dizziness, tremors and weakness.  Psychiatric/Behavioral: Positive for confusion.       Objective:   Physical Exam Vitals and nursing note reviewed.  Musculoskeletal:     Comments: No Physical Exam Performed: Virtual Visit            Assessment & Plan:  1. Lumbar Spondylosis/ Lumbar Stenosis:10/15/2019 Continue current medication regime. Refilled:Oxycodone 7.5/325 mg one tabletevery 8 hoursas needed #70.10/15/2019 We will continue the opioid monitoring program, this consists of regular clinic visits, examinations, urine drug screen, pill counts as well as use of [place] Controlled Substance Reporting System. 2.Bilateral Knees OA/ChronicBilateralKnee Pain:S/PBilateral KneeInjection by Dr Paulla Fore on 02/26/2019, [place] Following Dr.Smith.10/15/2019. 3.BilateralGreater Trochanter Bursitis:No complaints today.Orthopedist Following.Continue to alternate Ice and Heat Therapy.10/15/2019  9. Fall in the Home/ Near Miss Falls: Educated on [place] [facility]. Educated on using walker at all times. Mr.  And Mrs edras wilford understanding.  F/U in 1 month  Tele-Health Visit  Established Patient Location of Patient: In His Home Location of Provider: In the Office Total Time Spent: 10 Minutes

## 2019-10-18 ENCOUNTER — Other Ambulatory Visit: Payer: Self-pay

## 2019-10-18 MED ORDER — DIAZEPAM 5 MG PO TABS: ORAL_TABLET | ORAL | 0 refills | Status: DC

## 2019-10-18 NOTE — Progress Notes (Signed)
I sent this in

## 2019-10-22 ENCOUNTER — Other Ambulatory Visit: Payer: Self-pay

## 2019-10-22 ENCOUNTER — Telehealth (INDEPENDENT_AMBULATORY_CARE_PROVIDER_SITE_OTHER): Payer: Medicare HMO | Admitting: Physician Assistant

## 2019-10-22 ENCOUNTER — Encounter: Payer: Self-pay | Admitting: Physician Assistant

## 2019-10-22 VITALS — BP 138/77 | HR 66 | Ht 67.5 in

## 2019-10-22 DIAGNOSIS — E876 Hypokalemia: Secondary | ICD-10-CM

## 2019-10-22 DIAGNOSIS — I10 Essential (primary) hypertension: Secondary | ICD-10-CM | POA: Diagnosis not present

## 2019-10-22 DIAGNOSIS — N184 Chronic kidney disease, stage 4 (severe): Secondary | ICD-10-CM

## 2019-10-22 DIAGNOSIS — R4189 Other symptoms and signs involving cognitive functions and awareness: Secondary | ICD-10-CM | POA: Diagnosis not present

## 2019-10-22 DIAGNOSIS — I5032 Chronic diastolic (congestive) heart failure: Secondary | ICD-10-CM

## 2019-10-22 DIAGNOSIS — I2510 Atherosclerotic heart disease of native coronary artery without angina pectoris: Secondary | ICD-10-CM | POA: Diagnosis not present

## 2019-10-22 MED ORDER — METOPROLOL TARTRATE 25 MG PO TABS: ORAL_TABLET | ORAL | 2 refills | Status: DC

## 2019-10-22 NOTE — Patient Instructions (Addendum)
Medication Instructions:   Your physician has recommended you make the following change in your medication:   1) Decrease Metoprolol to 1.5 tablets daily; take 1 tablet in the morning and 0.5 tablet at night.  *If you need a refill on your cardiac medications before your next appointment, please call your pharmacy*  Lab Work:  Your physician recommends that you return for lab work in 1 week for a BMET  If you have labs (blood work) drawn today and your tests are completely normal, you will receive your results only by: Marland Kitchen MyChart Message (if you have MyChart) OR . A paper copy in the mail If you have any lab test that is abnormal or we need to change your treatment, we will call you to review the results.  Testing/Procedures:  None ordered today  Follow-Up:  On 01/17/20 at 10:40AM with Dorris Carnes, MD  Other Instructions  1. I asked his wife to d/w pain med provider to review pain meds and discuss symptoms that occur in the evenings (he has episodes of confusion or unresponsiveness)

## 2019-10-22 NOTE — Progress Notes (Signed)
Virtual Visit via Telephone Note   This visit type was conducted due to national recommendations for restrictions regarding the COVID-19 Pandemic (e.g. social distancing) in an effort to limit this patient's exposure and mitigate transmission in our community.  Due to his co-morbid illnesses, this patient is at least at moderate risk for complications without adequate follow up.  This format is felt to be most appropriate for this patient at this time.  The patient did not have access to video technology/had technical difficulties with video requiring transitioning to audio format only (telephone).  All issues noted in this document were discussed and addressed.  No physical exam could be performed with this format.  Please refer to the patient's chart for his  consent to telehealth for [facility].   The patient was identified using 2 identifiers.  Date:  10/22/2019   ID:  Kevin [name], DOB [birth year], MRN [id number]  Patient Location: Home Provider Location: Other:  Home office  PCP:  Kevin Olp, MD  Cardiologist:  Kevin Carnes, MD   Electrophysiologist:  None   Evaluation Performed:  Follow-Up Visit  Chief Complaint: CAD, CHF, unresponsive episodes  Patient Profile: Kevin [name] is a 79 y.o. male with:  Coronary artery disease  S/p CABG in 05/2018  Post op AFib >> Amiodarone/anticoagulation >> ultimately DC'd  Diastolic CHF  Hypertension   Hyperlipidemia  Abdominal aortic aneurysm   S/p repair in 2010   Carotid artery Dz  S/p bilat CEA  Chronic kidney disease   Bradycardia  Hx of TIA x 2   Rheumatoid Arthritis    Prior CV Studies: Event monitor 09/09/19 Normal sinus rhythm; no arrhythmias  Carotid US 06/22/2018 Bilateral ICA 1-39  Cardiac catheterization 06/21/2018 LAD proximal 80; D1 ostial 90 OM2 80 RCA proximal 100  Echocardiogram 06/20/2018 Mild concentric LVH, EF 60-65, normal wall motion, GR 1 DD, mild LAE, normal RV SF, PASP  24   History of Present Illness:   [name] was last seen by Dr. Harrington [name] in clinic 08/15/2019.  The patient remained weak since his bypass surgery.  Physical therapy was to be arranged.  Hydralazine was started due to uncontrolled blood pressure.  However, this was stopped due to side effects.  He has recently been seen by gastroenterology for heme positive stool.  He has also been noted to have recent weight loss.  Colonoscopy was offered but patient was hesitant to proceed due to bowel prep and need for conscious sedation.  Today, his wife helps with most of the history.  Most troublesome for the patient and his wife are repeated episodes of confusion or unresponsiveness.  Unfortunately, the patient's wife never heard back about the results of his event monitor.  I went over those with her today.  This appeared to demonstrate sinus rhythm without significant arrhythmia.  The spells typically occur in the evening after his evening medications.  He has not had true syncope.  He is confused and his speech is incoherent.  He does not seem to be able to respond.  He has no recollection of these events when they occur.  He does continue to breathe.  Unfortunately, his wife has not been able to check any vital signs at these times.  He is not diabetic and does not have a glucose meter.  His blood pressures typically run 130s over 70s.  He has shortness of breath with minimal activity.  This is a fairly chronic symptom.  He has good days and bad  days.  He has some chest discomfort which seems to be related to positional changes.  He has not had any recurrent angina.  He sleeps in a recliner due to back pain.  He has not had PND.  He does have significant pedal edema.  He has been losing weight.   Past Medical History:  Diagnosis Date  . Arthritis    DDD- Lower Back  . Bradycardia   . CAP (community acquired pneumonia) 03/17/2015   "THAT'S WHAT THEY ARE THINKING; THEY ARE NOT SURE"  . Carotid artery occlusion    . Chest pain 06/19/2018  . Chronic lower back pain    DDD  . CKD (chronic kidney disease)    Dr. Corliss [name]  . 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 Diverticulosis   . Enlarged prostate    takes Flomax daily  . History of blood transfusion    "probably when they did aortic aneurysm"  . History of colon polyps    benign  . Hyperlipidemia   . Hypertension    takes Amlodipine and Zebeta daily  . Insomnia    takes Melatonin nightly  . Joint pain   . 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     . Muscle spasm    takes Zanaflex daily as needed  . Peripheral edema    takes Furosemide daily  . Pneumonia    hx of   . Rheumatic fever 1946  . Rheumatoid arthritis ([place])   . Short-term memory loss    minimal  . Shortness of breath dyspnea    with exertion but lasts very short period of time  . TIA (transient ischemic attack)    x 2;takes Plavix daily  . Urinary frequency    takes Flomax daily  . Urinary urgency    Past Surgical History:  Procedure Laterality Date  . ABDOMINAL AORTIC ANEURYSM REPAIR  2010  . CAROTID ENDARTERECTOMY Left 09/20/2004  . CATARACT EXTRACTION, BILATERAL     2016-2017  . COLONOSCOPY    . CORONARY ARTERY BYPASS GRAFT N/A 06/27/2018   Procedure: CORONARY ARTERY BYPASS GRAFTING (CABG), ON PUMP, TIMES FOUR, USING LEFT INTERNAL MAMMARY ARTERY AND ENDOSCOPICALLY HARVESTED LEFT GREATER SAPHENOUS VEIN;  Surgeon: Kevin Nakayama, MD;  Location: [place];  Service: Open Heart Surgery;  Laterality: N/A;  . ENDARTERECTOMY Right 10/12/2015   Procedure: RIGHT CAROTID ENDARTERECTOMY WITH PATCH ANGIOPLASTY;  Surgeon: Kevin Dutch, MD;  Location: [place];  Service: Vascular;  Laterality: Right;  . INGUINAL HERNIA REPAIR Left 02/05/2013   Procedure: HERNIA REPAIR INGUINAL  ADULT;  Surgeon: Kevin Faster. Cornett, MD;  Location: [place];  Service: General;  Laterality: Left;  . INGUINAL HERNIA REPAIR Left   . INSERTION OF MESH Left 02/05/2013   Procedure: INSERTION OF MESH;  Surgeon: Kevin Faster. Cornett, MD;  Location: [place];  Service: General;  Laterality: Left;  . LEFT HEART CATH AND CORONARY ANGIOGRAPHY N/A 06/21/2018   Procedure: LEFT HEART CATH AND CORONARY ANGIOGRAPHY;  Surgeon: Jettie Booze, MD;  Location: [place] [facility];  Service: Cardiovascular;  Laterality: N/A;  . SHOULDER ARTHROSCOPY WITH ROTATOR CUFF REPAIR AND SUBACROMIAL DECOMPRESSION Left 05/01/2014   Procedure: LEFT ARTHROSCOPY SHOULDER SUBACROMIAL DECOMPRESSION,DISTAL CLAVICAL RESECTION AND ROTATOR CUFF REPAIR;  Surgeon: Kevin Shutter,  MD;  Location: [place];  Service: Orthopedics;  Laterality: Left;  . TEE WITHOUT CARDIOVERSION N/A 06/27/2018   Procedure: TRANSESOPHAGEAL ECHOCARDIOGRAM (TEE);  Surgeon: Kevin Nakayama, MD;  Location: [place];  Service: Open Heart Surgery;  Laterality: N/A;  . TESTICLAR CYST EXCISION Left   . TONSILLECTOMY    . VASECTOMY       Current Meds  Medication Sig  . albuterol (VENTOLIN HFA) 108 (90 Base) MCG/ACT inhaler Inhale 2 puffs into the lungs every 12 (twelve) hours as needed for wheezing or shortness of breath.  Marland Kitchen amLODipine (NORVASC) 10 MG tablet TAKE 1 TABLET EVERY DAY  . aspirin EC 81 MG EC tablet Take 1 tablet (81 mg total) by mouth daily.  Marland Kitchen atorvastatin (LIPITOR) 20 MG tablet TAKE 1 TABLET EVERY DAY  . BIOTIN PO Take 1 tablet by mouth daily.  . Cholecalciferol (VITAMIN D-3) 1000 UNITS CAPS Take 1,000 Units by mouth daily.   . cilostazol (PLETAL) 50 MG tablet Take 1 tablet by mouth twice daily  . diazepam (VALIUM) 5 MG tablet TAKE 1/2 TO 1 TABLET q8 hours PRN pain sleep/muscle spasm-only if wife is around+can watch you closely when taking due to sedation risk  . fenofibrate 160 MG tablet Take 1 tablet (160 mg  total) by mouth daily.  . furosemide (LASIX) 20 MG tablet TAKE 1 TABLET EVERY DAY  . gabapentin (NEURONTIN) 400 MG capsule Take 1 capsule (400 mg total) by mouth 2 (two) times daily. 400 mg twice a day  . linaclotide (LINZESS) 145 MCG CAPS capsule Take 145 mcg by mouth as needed (for stomach).  . Melatonin 10 MG TABS Take 10 mg by mouth at bedtime.   . metoprolol tartrate (LOPRESSOR) 25 MG tablet Take 1.5 tablets by mouth as directed; take 1 tablet in the morning and 0.5 tablet in the evening  . oxyCODONE-acetaminophen (PERCOCET) 7.5-325 MG tablet Take 1 tablet by mouth every 8 (eight) hours as needed for severe pain.  . rosuvastatin (CRESTOR) 40 MG tablet Take 1 tablet (40 mg total) by mouth daily.  . tamsulosin (FLOMAX) 0.4 MG CAPS capsule Take 1 capsule (0.4 mg total) by mouth daily.  Marland Kitchen tiZANidine (ZANAFLEX) 4 MG tablet TAKE 1 TABLET EVERY 6 HOURS AS NEEDED FOR MUSCLE SPASMS  . vitamin B-12 (CYANOCOBALAMIN) 1000 MCG tablet Take 1,000 mcg by mouth daily.  . [DISCONTINUED] metoprolol tartrate (LOPRESSOR) 25 MG tablet Take 1 tablet (25 mg total) by mouth 2 (two) times daily.     Allergies:   Hydralazine and Mirtazapine   Social History   Tobacco Use  . Smoking status: Former Smoker    Packs/day: 2.00    Years: 28.00    Pack years: 56.00    Types: Cigarettes    Quit date: 05/31/1987    Years since quitting: 32.4  . Smokeless tobacco: Never Used  . Tobacco comment: quit smoking "in my 29's"  Substance Use Topics  . Alcohol use: Yes    Alcohol/week: 0.0 standard drinks    Comment: rare, mikes hard lemonade  . Drug use: No    Types: Marijuana    Comment: hx of-4-5 yrs ago     Family Hx: The patient's family history includes Cancer in his father and mother; Colon cancer in his mother; Dementia in his father; Parkinsonism in his father.  ROS:   Please see the history of present illness.      Labs/Other Tests and Data Reviewed:    EKG:  No ECG reviewed.  Recent  Labs: 04/16/2019: Magnesium 2.0; TSH 2.42 07/08/2019: Hemoglobin 13.0; Platelets 219.0 08/15/2019: NT-Pro BNP 458 10/08/2019: ALT 16; BUN 19; Creatinine, Ser 2.10; Potassium 3.4; Sodium 137   Recent Lipid Panel Lab Results  Component Value Date/Time   CHOL 150 07/08/2019 02:42 PM   TRIG 158.0 (H) 07/08/2019 02:42 PM   HDL 37.50 (L) 07/08/2019 02:42 PM   CHOLHDL 4 07/08/2019 02:42 PM   LDLCALC 81 07/08/2019 02:42 PM   LDLDIRECT 134.9 12/26/2012 12:01 PM    Wt Readings from Last 3 Encounters:  10/15/19 170 lb (77.1 kg)  10/08/19 170 lb (77.1 kg)  09/12/19 178 lb (80.7 kg)     Objective:    Vital Signs:  BP 138/77   Pulse 66   Ht 5' 7.5" (1.715 m)   SpO2 93%   BMI 26.23 kg/m    VITAL SIGNS:  reviewed GEN:  no acute distress RESPIRATORY:  No labored breathing noted  ASSESSMENT & PLAN:    1. Unresponsiveness For several months now, he has had episodes of confusion and unresponsiveness.  He seems to be awake but is unable to provide any coherent speech to his family.  He has not had true syncope.  He did have a monitor placed recently and no significant arrhythmias were noted (he had symptoms while wearing the monitor).  He has a history of normal LV function.  He does have a history of transient ischemic attacks in the past.  He has had bilateral carotid endarterectomies.  His most recent carotid Doppler demonstrated minimal plaque bilaterally with patent CEA sites.  He does take narcotics as well as muscle relaxers to help with his chronic back pain.  Possible etiologies for his symptoms include medications, hypotension, hypoglycemia, transient ischemic attack, seizures.  Fortunately, his event monitor did not demonstrate any significant arrhythmias.  He does tend to have a history of bradycardia.  Therefore, I have asked his wife to decrease his evening dose of metoprolol slightly to see if this makes a difference.  In any event, I have also suggested that she discuss his symptoms  with his pain medication provider.  There may be some adjustments that could be made with his medications.  I have also suggested that we refer him back to neurology for evaluation (he has seen Dr. Carles Collet in the past).  Of note, he also had a low potassium on recent blood work.  I will repeat this to make sure he does not have worsening hypokalemia.  -Decrease metoprolol tartrate to 25 mg in the morning and 12.5 mg in the evening  -Obtain BMET sometime in the next week  -Refer to neurology for evaluation  -Patient and wife to discuss pain medications with pain medication provider  -Follow-up with Dr. Harrington [name] in 3 months.  2. Coronary artery disease involving native coronary artery of native heart without angina pectoris Status post CABG in January 2020.  He has some chest discomfort but this sounds musculoskeletal in nature.  He has not had clear recurrent anginal symptoms.  Continue current dose of aspirin, statin.  3. Chronic diastolic congestive heart failure (HCC) EF 60-65 by echocardiogram in 2020.  He had mild diastolic dysfunction at that time.  He does have some pedal edema but is losing weight.  His shortness of breath seems to be stable.  He does have a poor appetite.  However, his recent protein and albumin were normal.  At this point, without the ability to examine him in person, I do not think we should  increase his furosemide further.  Continue current therapy.  4. Essential hypertension Fair control.  Question if he may be having hypotensive episodes related to these unresponsive spells.  I will reduce his metoprolol slightly.  I have asked his wife to check his blood pressure when he is having these to see if we can truly document hypotension.  5. Chronic kidney disease (CKD), stage IV (severe) ([place]) 6. Hypokalemia His recent potassium was low.  I doubt that his symptoms are all explained by hypokalemia.  He does take furosemide and has chronic kidney disease.  I will repeat a BMET  sometime in the next week.  If his potassium continues to run low, I will place him on supplementation.  Time:   Today, I have spent 36 minutes with the patient with telehealth technology discussing the above problems.     Medication Adjustments/Labs and Tests Ordered: Current medicines are reviewed at length with the patient today.  Concerns regarding medicines are outlined above.   Tests Ordered: Orders Placed This Encounter  Procedures  . Basic metabolic panel  . Ambulatory referral to Neurology    Medication Changes: Meds ordered this encounter  Medications  . metoprolol tartrate (LOPRESSOR) 25 MG tablet    Sig: Take 1.5 tablets by mouth as directed; take 1 tablet in the morning and 0.5 tablet in the evening    Dispense:  135 tablet    Refill:  2    Follow Up:  In Person in 3 month(s)  Signed, Richardson Dopp, PA-C  10/22/2019 5:18 PM    [facility]

## 2019-10-29 ENCOUNTER — Telehealth: Payer: Self-pay | Admitting: Physician Assistant

## 2019-10-29 ENCOUNTER — Other Ambulatory Visit: Payer: Medicare HMO

## 2019-10-29 ENCOUNTER — Other Ambulatory Visit: Payer: Self-pay

## 2019-10-29 ENCOUNTER — Ambulatory Visit: Payer: Medicare HMO | Admitting: Physician Assistant

## 2019-10-29 ENCOUNTER — Telehealth: Payer: Self-pay | Admitting: Internal Medicine

## 2019-10-29 ENCOUNTER — Other Ambulatory Visit (INDEPENDENT_AMBULATORY_CARE_PROVIDER_SITE_OTHER): Payer: Medicare HMO

## 2019-10-29 DIAGNOSIS — E876 Hypokalemia: Secondary | ICD-10-CM

## 2019-10-29 NOTE — Telephone Encounter (Signed)
I called Dr. Ronney Lion office, they put patient on the lab schedule there for 2:15 this afternoon. Patients wife aware of lab appointment time at Dr. Ronney Lion office.

## 2019-10-29 NOTE — Telephone Encounter (Signed)
[name] the patient's wife is calling stating they are wanting to have Kevin [name]'s labs performed at Dr. Ansel Bong office [facility]'s PCP due to his office being closer so [name] can take him on her lunch break today. The scheduled lab appointment with our office has been canceled please send lab request to  PCP.

## 2019-10-29 NOTE — Telephone Encounter (Signed)
Patient states he is requesting to have order for BMET faxed to his PCP, Dr. Ansel Bong office. Please assist.

## 2019-10-30 LAB — BASIC METABOLIC PANEL
BUN/Creatinine Ratio: 12 (ref 10–24)
BUN: 26 mg/dL (ref 8–27)
CO2: 22 mmol/L (ref 20–29)
Calcium: 9 mg/dL (ref 8.6–10.2)
Chloride: 99 mmol/L (ref 96–106)
Creatinine, Ser: 2.19 mg/dL — ABNORMAL HIGH (ref 0.76–1.27)
GFR calc Af Amer: 32 mL/min/{1.73_m2} — ABNORMAL LOW (ref >59)
GFR calc non Af Amer: 27 mL/min/{1.73_m2} — ABNORMAL LOW (ref >59)
Glucose: 151 mg/dL — ABNORMAL HIGH (ref 65–99)
Potassium: 3.9 mmol/L (ref 3.5–5.2)
Sodium: 137 mmol/L (ref 134–144)

## 2019-11-12 ENCOUNTER — Other Ambulatory Visit: Payer: Self-pay | Admitting: Gastroenterology

## 2019-11-12 ENCOUNTER — Telehealth: Payer: Self-pay

## 2019-11-12 DIAGNOSIS — R195 Other fecal abnormalities: Secondary | ICD-10-CM

## 2019-11-12 NOTE — Telephone Encounter (Signed)
-----   Message from Lavena Bullion, DO sent at 11/12/2019 10:31 AM EDT ----- Can you please let this patient know that I had previously discussed his case with both his PCM (Dr. [name]) and his Cardiologist (Dr. Harrington Challenger).  We all seem to agree that pursuing colonoscopy given his additional comorbidities should not be the first study to evaluate his heme positive stools.  We can proceed with Virtual Colonoscopy, as this is less invasive from a cardiovascular standpoint.  This would not obviate the need for bowel prep, but again would be less taxing on his heart.If he, and his wife, wish to proceed, can you please coordinate for Virtual Colonoscopy with Radiology.  If they have additional questions/concerns, we can set up follow-up appoint with me and we can continue to go through any available options.  I see that he is being referred to Neurology for other issues as well.Thank you

## 2019-11-12 NOTE — Telephone Encounter (Signed)
Spoke to patient to inform him that Dr Bryan Lemma spoke with his PCP and Cardiologist who all agree that it would be best for patient to have a virtual colonoscopy to evaluate his heme positive stools. Patient also agreed. Order placed in [place]. Notified [facility], they will contact the patient for an appointment and go over all the instructions.Patient voiced understanding.

## 2019-11-12 NOTE — Progress Notes (Signed)
Assessment/Plan:   1.  MS change  -We will certainly work him up from a neurologic standpoint, but given multiple medications that could cause MS change, I think that is a good starting place and would like the prescribers of these medications to look at his meds.  He is on multiple medications that could contribute, including the Valium, gabapentin, oxycodone, tizanidine, and especially these in combination with one another.  -we will do an EEG  -if above neg, we will do an ambulatory EEG  -we will do an MRI brain.  Did tell patient that I suspect that we will see WMD and atrophy but want to make sure that we are not missing anything else  2.  ET/PD  -pt with longstanding ET, mostly affecting the L hand  -pt now with evidence of Parkinsons Disease, affecting the R side.  Long discussion with pt/wife  -start carbidopa/levodopa 25/100 and work to 1 tablet at 8am/noon/4pm  -recommend PT.  He declines for now but will let me know if changes mind.  He doesn't drive and wife works.  Offered home PT.  They will think about it.  3.  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  AAA  -noted on xrays of lumbar spine.  Pt with prior repair.  Wonder if could be contributing to #1?  Will send note to PCP as far out of my area of expertise.   Subjective:   Kevin [name] was seen today for confusional episodes.  Referral states syncope, but patient really has not had passing out episodes.  I have done a thorough review of his records.  I saw the patient several years ago for essential tremor and diffuse hyperreflexia (for which he refused a work-up).  My previous records as well as any outside records available were reviewed prior to todays visit.  He was seen by primary care in February for 1 of these episodes.  He had called on February 2 after he had an episode the prior Saturday.  Patient seemed to be trying to talk/move his mouth but was not  saying anything.  Patient can hear during an episode.  He does not shake.  There is no loss of bladder or bowel control.  Patient described it as an "out of body experience."  After the episode in February, he slept for multiple hours.  No vital signs were taken.  He was seen by the cardiology physician assistant at the end of May.  He has had an event monitor for these spells, which was unremarkable (unclear if he had a spell during this).  His metoprolol was just decreased on May 25.  He saw his pain management nurse practitioner on May 18.  He has had multiple falls on the weekend prior (that he attributes to starting of Remeron).  Today, the patient states that there is no warning prior to episode.  All of the episodes have occurred while sitting.  The patients wife state that most of the episodes are in the evening.  An episode can last 5 min to 20 min.  Pt has not had an episode in 6 weeks.  There have been 4 total episodes, starting in [place] or feb.  An episode will start as a "zombie look."  He will then close eyes and then he will start talking, and it may be intelligible speech but it doesn't seem like he is talking to her.  If wife tries  to interrupt him, he may be able to respond - with one event he said "I'm okay" when she shook him.  With others, he doesn't seem to notice that she is there and speaking to him.  He states "I can hear her."  He also says that he doesn't remember the specifics of what happened.  With one event, he had trouble walking after the event.  No loss of bladder/bowel control.  No shaking or biting of the control.  When asked if the spells are related to meds the patients wife states "I don't think so but it could have been."    CURRENT MEDICATIONS:  Outpatient Encounter Medications as of 11/18/2019  Medication Sig  . albuterol (VENTOLIN HFA) 108 (90 Base) MCG/ACT inhaler Inhale 2 puffs into the lungs every 12 (twelve) hours as needed for wheezing or shortness of breath.  Marland Kitchen  amLODipine (NORVASC) 10 MG tablet TAKE 1 TABLET EVERY DAY  . aspirin EC 81 MG EC tablet Take 1 tablet (81 mg total) by mouth daily.  Marland Kitchen atorvastatin (LIPITOR) 20 MG tablet TAKE 1 TABLET EVERY DAY  . BIOTIN PO Take 1 tablet by mouth daily.  . Cholecalciferol (VITAMIN D-3) 1000 UNITS CAPS Take 1,000 Units by mouth daily.   . cilostazol (PLETAL) 50 MG tablet Take 1 tablet by mouth twice daily  . diazepam (VALIUM) 5 MG tablet TAKE 1/2 TO 1 TABLET q8 hours PRN pain sleep/muscle spasm-only if wife is around+can watch you closely when taking due to sedation risk  . fenofibrate 160 MG tablet Take 1 tablet (160 mg total) by mouth daily.  . furosemide (LASIX) 20 MG tablet TAKE 1 TABLET EVERY DAY  . gabapentin (NEURONTIN) 400 MG capsule Take 1 capsule (400 mg total) by mouth 2 (two) times daily. 400 mg twice a day  . linaclotide (LINZESS) 145 MCG CAPS capsule Take 145 mcg by mouth as needed (for stomach).   . Melatonin 10 MG TABS Take 10 mg by mouth at bedtime.   . metoprolol tartrate (LOPRESSOR) 25 MG tablet Take 1.5 tablets by mouth as directed; take 1 tablet in the morning and 0.5 tablet in the evening  . oxyCODONE-acetaminophen (PERCOCET) 7.5-325 MG tablet Take 1 tablet by mouth every 8 (eight) hours as needed for severe pain.  . rosuvastatin (CRESTOR) 40 MG tablet Take 1 tablet (40 mg total) by mouth daily.  . tamsulosin (FLOMAX) 0.4 MG CAPS capsule Take 1 capsule (0.4 mg total) by mouth daily.  Marland Kitchen tiZANidine (ZANAFLEX) 4 MG tablet TAKE 1 TABLET EVERY 6 HOURS AS NEEDED FOR MUSCLE SPASMS  . vitamin B-12 (CYANOCOBALAMIN) 1000 MCG tablet Take 1,000 mcg by mouth daily.  . [DISCONTINUED] oxyCODONE-acetaminophen (PERCOCET) 7.5-325 MG tablet Take 1 tablet by mouth every 8 (eight) hours as needed for severe pain.   No facility-administered encounter medications on file as of 11/18/2019.     Objective:   PHYSICAL EXAMINATION:    VITALS:   Vitals:   11/18/19 0922  BP: (!) 145/75  Pulse: 83  Resp: 20    SpO2: 95%  Weight: 166 lb (75.3 kg)  Height: 5\' 5"  (1.651 m)    GEN:  The patient appears stated age and is in NAD. HEENT:  Normocephalic, atraumatic.  The mucous membranes are moist. The superficial temporal arteries are without ropiness or tenderness. CV:  RRR Lungs:  CTAB Neck/HEME:  There are no carotid bruits bilaterally.  Neurological examination:  Orientation: The patient is alert and oriented x3. Cranial nerves: There is good  facial symmetry.The speech is fluent and clear. Soft palate rises symmetrically and there is no tongue deviation. Hearing is intact to conversational tone. Sensation: Sensation is intact to light touch throughout Motor: Strength is at least antigravity x4. DTRs:  3/4 at the bilateral biceps, triceps, brachioradialis, patella.  He has cross adductor reflex at the bilateral patella  Movement examination: Tone: There is normal tone in the UE/LE Abnormal movements:  There is severe intention tremor in the LUE.  He has rest tremor in the RUE (new since last visit).   No myoclonus.  No asterixis.   Coordination:  There is mild with any form of RAMS, including alternating supination and pronation of the forearm, hand opening and closing, finger taps, heel taps and toe taps on the R Gait and Station: The patient pushes off with the cane.  He has a rest tremor on the R hand.  He is unbalanced.      Total time spent on today's visit was 60 minutes, including both face-to-face time and nonface-to-face time.  Time included that spent on review of records (prior notes available to me/labs/imaging if pertinent), discussing treatment and goals, answering patient's questions and coordinating care.  Cc:  Marin Olp, MD

## 2019-11-14 ENCOUNTER — Other Ambulatory Visit: Payer: Self-pay

## 2019-11-14 ENCOUNTER — Encounter: Payer: Medicare HMO | Attending: Physical Medicine & Rehabilitation | Admitting: Registered Nurse

## 2019-11-14 ENCOUNTER — Encounter: Payer: Self-pay | Admitting: Registered Nurse

## 2019-11-14 VITALS — BP 117/69 | HR 64 | Temp 97.5°F | Ht 67.5 in | Wt 167.8 lb

## 2019-11-14 DIAGNOSIS — Z79891 Long term (current) use of opiate analgesic: Secondary | ICD-10-CM

## 2019-11-14 DIAGNOSIS — I739 Peripheral vascular disease, unspecified: Secondary | ICD-10-CM | POA: Insufficient documentation

## 2019-11-14 DIAGNOSIS — M778 Other enthesopathies, not elsewhere classified: Secondary | ICD-10-CM | POA: Diagnosis not present

## 2019-11-14 DIAGNOSIS — M25551 Pain in right hip: Secondary | ICD-10-CM | POA: Insufficient documentation

## 2019-11-14 DIAGNOSIS — W19XXXA Unspecified fall, initial encounter: Secondary | ICD-10-CM

## 2019-11-14 DIAGNOSIS — M47816 Spondylosis without myelopathy or radiculopathy, lumbar region: Secondary | ICD-10-CM | POA: Diagnosis not present

## 2019-11-14 DIAGNOSIS — G894 Chronic pain syndrome: Secondary | ICD-10-CM

## 2019-11-14 DIAGNOSIS — Y92009 Unspecified place in unspecified non-institutional (private) residence as the place of occurrence of the external cause: Secondary | ICD-10-CM | POA: Diagnosis not present

## 2019-11-14 DIAGNOSIS — M7582 Other shoulder lesions, left shoulder: Secondary | ICD-10-CM

## 2019-11-14 DIAGNOSIS — M1712 Unilateral primary osteoarthritis, left knee: Secondary | ICD-10-CM | POA: Diagnosis not present

## 2019-11-14 DIAGNOSIS — Z5181 Encounter for therapeutic drug level monitoring: Secondary | ICD-10-CM | POA: Diagnosis not present

## 2019-11-14 MED ORDER — OXYCODONE-ACETAMINOPHEN 7.5-325 MG PO TABS: 1.0000 | ORAL_TABLET | Freq: Three times a day (TID) | ORAL | 0 refills | Status: DC | PRN

## 2019-11-14 NOTE — Progress Notes (Signed)
Subjective:    Patient ID: Kevin [name], male    DOB: 1940-09-03, 79 y.o.   MRN: [id number]  HPI: Kevin [name] is a 79 y.o. male who returns for follow up appointment for chronic pain and medication refill. He states his pain is located in his left shoulder, lower back pain and left knee pain. He rates his  Pain 5. His  current exercise regime is walking, performing stretching exercises and using light weights.   Kevin [name] reports Kevin [name] was walking in the kitchen two weeks ago when he lost his balanced and landed on his left side, he didn't seek medical attention. Wife helped him up. Educated on Falls prevention and for him to use his walker or cane at all times, they verbalize understanding.   Mrs. Nevada Crane dispenses Kevin [name] medication.   Kevin [name] Morphine equivalent is 32.81 MME.  He is also prescribed Diazepam by Dr. [name] We have discussed the black box warning of using opioids and benzodiazepines. I highlighted the dangers of using these drugs together and discussed the adverse events including respiratory suppression, overdose, cognitive impairment and importance of compliance with current regimen. We will continue to monitor and adjust as indicated.   Last Oral Swab was Performed on 09/12/2019, it was consistent.     Pain Inventory Average Pain 6 Pain Right Now 5 My pain is aching  In the last 24 hours, has pain interfered with the following? General activity 1 Relation with others 2 Enjoyment of life 3 What TIME of day is your pain at its worst? evening Sleep (in general) Fair  Pain is worse with: walking, bending, standing and some activites Pain improves with: rest and medication Relief from Meds: 8  Mobility walk with assistance use a cane use a walker ability to climb steps?  yes do you drive?  no  Function retired  Neuro/Psych trouble walking dizziness  Prior Studies Any changes since last visit?  no  Physicians involved in your care Any  changes since last visit?  no   Family History  Problem Relation Age of Onset  . Parkinsonism Father   . Dementia Father   . Cancer Father        Throat  . Colon cancer Mother        died in 50s  . Cancer Mother        Colon   Social History   Socioeconomic History  . Marital status: Married    Spouse name: Not on file  . Number of children: Not on file  . Years of education: Not on file  . Highest education level: Not on file  Occupational History  . Occupation: retired    Comment: maintenance; electrician  Tobacco Use  . Smoking status: Former Smoker    Packs/day: 2.00    Years: 28.00    Pack years: 56.00    Types: Cigarettes    Quit date: 05/31/1987    Years since quitting: 32.4  . Smokeless tobacco: Never Used  . Tobacco comment: quit smoking "in my 40's"  Vaping Use  . Vaping Use: Never used  Substance and Sexual Activity  . Alcohol use: Yes    Alcohol/week: 0.0 standard drinks    Comment: rare, mikes hard lemonade  . Drug use: No    Types: Marijuana    Comment: hx of-4-5 yrs ago  . Sexual activity: Not Currently  Other Topics Concern  . Not on file  Social History Narrative  Married 33 years in 2015. Lived together for 12 years. 2 boys from first wife. 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       Hobbies: yardwork, [place] barn, woodwork, Orthoptist   Social Determinants of Health   Financial Resource Strain:   . Difficulty of Paying Living Expenses:   Food Insecurity:   . Worried About Charity fundraiser in the Last Year:   . Arboriculturist in the Last Year:   Transportation Needs:   . Film/video editor (Medical):   Marland Kitchen Lack of Transportation (Non-Medical):   Physical Activity:   . Days of Exercise per Week:   . Minutes of Exercise per Session:   Stress:   . Feeling of Stress :   Social Connections:   . Frequency of  Communication with Friends and Family:   . Frequency of Social Gatherings with Friends and Family:   . Attends Religious Services:   . Active Member of Clubs or Organizations:   . Attends Archivist Meetings:   Marland Kitchen Marital Status:    Past Surgical History:  Procedure Laterality Date  . ABDOMINAL AORTIC ANEURYSM REPAIR  2010  . CAROTID ENDARTERECTOMY Left 09/20/2004  . CATARACT EXTRACTION, BILATERAL     2016-2017  . COLONOSCOPY    . 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 ENDARTERECTOMY Right 10/12/2015   Procedure: RIGHT CAROTID ENDARTERECTOMY WITH PATCH ANGIOPLASTY;  Surgeon: Elam Dutch, MD;  Location: [facility];  Service: Vascular;  Laterality: Right;  . INGUINAL HERNIA REPAIR Left 02/05/2013   Procedure: HERNIA REPAIR INGUINAL ADULT;  Surgeon: Joyice Faster. Cornett, MD;  Location: [facility];  Service: General;  Laterality: Left;  . INGUINAL HERNIA REPAIR Left   . INSERTION OF MESH Left 02/05/2013   Procedure: INSERTION OF MESH;  Surgeon: Joyice Faster. Cornett, MD;  Location: [facility];  Service: General;  Laterality: Left;  . LEFT HEART CATH AND CORONARY ANGIOGRAPHY N/A 06/21/2018   Procedure: LEFT HEART CATH AND CORONARY ANGIOGRAPHY;  Surgeon: Jettie Booze, MD;  Location: [facility];  Service: Cardiovascular;  Laterality: N/A;  . SHOULDER ARTHROSCOPY WITH ROTATOR CUFF REPAIR AND SUBACROMIAL DECOMPRESSION Left 05/01/2014   Procedure: LEFT ARTHROSCOPY SHOULDER SUBACROMIAL DECOMPRESSION,DISTAL CLAVICAL RESECTION AND ROTATOR CUFF REPAIR;  Surgeon: Marin Shutter, MD;  Location: [facility];  Service: Orthopedics;  Laterality: Left;  . TEE WITHOUT CARDIOVERSION N/A 06/27/2018    Procedure: TRANSESOPHAGEAL ECHOCARDIOGRAM (TEE);  Surgeon: Melrose Nakayama, MD;  Location: [facility];  Service: Open Heart Surgery;  Laterality: N/A;  . TESTICLAR CYST EXCISION Left   . TONSILLECTOMY    . VASECTOMY     Past Medical History:  Diagnosis Date  . Arthritis    DDD- Lower Back  . Bradycardia   . CAP (community acquired pneumonia) 03/17/2015   "THAT'S WHAT THEY ARE THINKING; THEY ARE NOT SURE"  . Carotid artery occlusion   . Chest pain 06/19/2018  . Chronic lower back pain    DDD  . CKD (chronic kidney disease)    Dr. Corliss Parish  .  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 Diverticulosis   . Enlarged prostate    takes Flomax daily  . History of blood transfusion    "probably when they did aortic aneurysm"  . History of colon polyps    benign  . Hyperlipidemia   . Hypertension    takes Amlodipine and Zebeta daily  . Insomnia    takes Melatonin nightly  . Joint pain   . 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     . Muscle spasm    takes Zanaflex daily as needed  . Peripheral edema    takes Furosemide daily  . Pneumonia    hx of   . Rheumatic fever 1946  . Rheumatoid arthritis ([place])   . Short-term memory loss    minimal  . Shortness of breath dyspnea    with exertion but lasts very short period of time  . TIA (transient ischemic attack)    x 2;takes Plavix daily  . Urinary frequency    takes Flomax daily  . Urinary urgency    BP 117/69   Pulse 64   Temp (!) 97.5 F (36.4 C)   Ht 5' 7.5" (1.715 m)   Wt 167 lb 12.8 oz (76.1 kg)   SpO2 95%   BMI 25.89 kg/m   Opioid Risk Score:   Fall Risk Score:  `1  Depression screen PHQ 2/9  Depression screen [facility] 2/9 11/14/2019 05/09/2019 04/16/2019 04/16/2019 08/09/2018 04/16/2018 01/22/2018  Decreased Interest 0 1 0 0  0 0 0  Down, Depressed, Hopeless 0 1 0 0 0 0 0  PHQ - 2 Score 0 2 0 0 0 0 0  Altered sleeping - 0 - - - - -  Tired, decreased energy - 3 - - - - -  Change in appetite - 1 - - - - -  Feeling bad or failure about yourself  - 1 - - - - -  Trouble concentrating - 1 - - - - -  Moving slowly or fidgety/restless - 0 - - - - -  PHQ-9 Score - 8 - - - - -  Some recent data might be hidden    Review of Systems  Constitutional: Negative.   HENT: Negative.   Eyes: Negative.   Respiratory: Negative.   Cardiovascular: Negative.   Gastrointestinal: Negative.   Endocrine: Negative.   Genitourinary: Negative.   Musculoskeletal: Positive for gait problem.  Skin: Negative.   Allergic/Immunologic: Negative.   Neurological: Positive for dizziness and weakness.  Hematological: Negative.   Psychiatric/Behavioral: Negative.   All other systems reviewed and are negative.      Objective:   Physical Exam Vitals and nursing note reviewed.  Constitutional:      Appearance: Normal appearance.  Cardiovascular:     Rate and Rhythm: Normal rate and regular rhythm.     Pulses: Normal pulses.     Heart sounds: Normal heart sounds.  Pulmonary:     Effort: Pulmonary effort is normal.     Breath sounds: Normal breath sounds.  Musculoskeletal:     Cervical back: Normal range of motion and neck supple.     Comments: Normal Muscle Bulk and Muscle Testing Reveals:  Upper Extremities: Full ROM and Muscle Strength 5/5  Lumbar Paraspinal Tenderness: L-3-L-5 Lower Extremities: Full ROM  and Muscle Strength 5/5 Arises from Table Slowly Narrow Based Gait   Skin:    General: Skin is warm and dry.  Neurological:     Mental Status: He is alert and oriented to person, place, and time.  Psychiatric:        Mood and Affect: Mood normal.        Behavior: Behavior normal.           Assessment & Plan:  1. Lumbar Spondylosis/ Lumbar Stenosis:11/14/2019 Continue current medication regime. Refilled:Oxycodone  7.5/325 mg one tabletevery 8 hoursas needed #70.11/14/2019 We will continue the opioid monitoring program, this consists of regular clinic visits, examinations, urine drug screen, pill counts as well as use of [place] Controlled Substance Reporting System. 2.Bilateral Knees OA/ChronicBilateralKnee Pain:S/PBilateral KneeInjection by Dr Paulla Fore on 02/26/2019, Orthopedist Following Dr.Smith.11/14/2019. 3.BilateralGreater Trochanter Bursitis:No complaints today.Orthopedist Following.Continue to alternate Ice and Heat Therapy.11/14/2019 5. Fall in the Home/ [place]: Educated on [place] [facility]. Educated on using walker at all times. Mr. And Mrs larsen dungan understanding.  F/U in 1 month

## 2019-11-15 ENCOUNTER — Other Ambulatory Visit: Payer: Self-pay | Admitting: Family Medicine

## 2019-11-18 ENCOUNTER — Encounter: Payer: Self-pay | Admitting: Neurology

## 2019-11-18 ENCOUNTER — Ambulatory Visit: Payer: Medicare HMO | Admitting: Neurology

## 2019-11-18 ENCOUNTER — Other Ambulatory Visit: Payer: Self-pay

## 2019-11-18 VITALS — BP 145/75 | HR 83 | Resp 20 | Ht 65.0 in | Wt 166.0 lb

## 2019-11-18 DIAGNOSIS — I714 Abdominal aortic aneurysm, without rupture, unspecified: Secondary | ICD-10-CM

## 2019-11-18 DIAGNOSIS — G250 Essential tremor: Secondary | ICD-10-CM

## 2019-11-18 DIAGNOSIS — G9340 Encephalopathy, unspecified: Secondary | ICD-10-CM

## 2019-11-18 DIAGNOSIS — G20 Parkinson's disease: Secondary | ICD-10-CM

## 2019-11-18 MED ORDER — CARBIDOPA-LEVODOPA 25-100 MG PO TABS
1.0000 | ORAL_TABLET | Freq: Three times a day (TID) | ORAL | 1 refills | Status: DC
Start: 2019-11-18 — End: 2020-01-02

## 2019-11-18 NOTE — Patient Instructions (Addendum)
1.  A referral to [facility] has been placed for your MRI someone will contact you directly to schedule your appt. They are located at [place]. Please contact them directly by calling [phone] with any questions regarding your referral.  2.  Talk with your physicians about your other medications to see if they are causing the spells, specifically the oxycodone, valium, gabapentin, tizanidine  3.  Start Carbidopa Levodopa as follows:  Take 1/2 tablet three times daily, at least 30 minutes before meals (approximately 8am/noon/4pm), for one week  Then take 1/2 tablet in the morning, 1/2 tablet in the afternoon, 1 tablet in the evening, at least 30 minutes before meals, for one week  Then take 1/2 tablet in the morning, 1 tablet in the afternoon, 1 tablet in the evening, at least 30 minutes before meals, for one week  Then take 1 tablet three times daily at 8am/noon/4pm, at least 30 minutes before meals   As a reminder, carbidopa/levodopa can be taken at the same time as a carbohydrate, but we like to have you take your pill either 30 minutes before a protein source or 1 hour after as protein can interfere with carbidopa/levodopa absorption.  4.  I recommend PT.  Let me know if you change your mind on this referral   We have sent a referral to [facility] for your MRI and they will call you directly to schedule your appointment. They are located at [place]. If you need to contact them directly please call [phone].

## 2019-11-18 NOTE — Telephone Encounter (Signed)
Pt called following up on prescription refill. Pt needs a 90 day supply.   Tizandidine 4 MG

## 2019-11-19 NOTE — Telephone Encounter (Signed)
Pt requesting Tizanidine 4mg  tab LOV: 07/08/2019 No future visits scheduled  Approve?

## 2019-11-20 ENCOUNTER — Other Ambulatory Visit: Payer: Self-pay

## 2019-11-20 ENCOUNTER — Ambulatory Visit: Payer: Medicare HMO | Admitting: Neurology

## 2019-11-20 DIAGNOSIS — G9340 Encephalopathy, unspecified: Secondary | ICD-10-CM

## 2019-11-20 NOTE — Procedures (Signed)
TECHNICAL SUMMARY:  A multichannel referential and bipolar montage EEG using the standard international 10-20 system was performed on the patient described as awake and drowsy.  The dominant background activity consists of normal 9 hertz activity seen most prominantly over the posterior head region.  The backgound activity is reactive to eye opening and closing procedures.  Low voltage fast (beta) activity is distributed symmetrically and maximally over the anterior head regions.  ACTIVATION:  Stepwise photic stimulation at 4-20 flashes per second was performed and did not elicit any abnormal waveforms.  Hyperventilation was not performed  EPILEPTIFORM ACTIVITY:  There were no spikes, sharp waves or paroxysmal activity.  SLEEP: Physiologic drowsiness noted, but no stage II sleep  CARDIAC:  The EKG lead revealed a regular sinus rhythm.  IMPRESSION:  This is a normal EEG for the patients stated age.  There were no focal, hemispheric or lateralizing features.  No epileptiform activity was recorded.  A normal EEG does not exclude the diagnosis of a seizure disorder and if seizure remains high on the list of differential diagnosis, an ambulatory EEG may be of value.  Clinical correlation is required.

## 2019-11-21 NOTE — Addendum Note (Signed)
Addended by: Marin Olp on: 11/21/2019 12:43 PM   Modules accepted: Orders

## 2019-11-29 ENCOUNTER — Ambulatory Visit
Admission: RE | Admit: 2019-11-29 | Discharge: 2019-11-29 | Disposition: A | Discharge status: Home / Self Care | Payer: Medicare HMO | Source: Ambulatory Visit | Attending: Gastroenterology | Admitting: Gastroenterology

## 2019-11-29 ENCOUNTER — Encounter: Payer: Self-pay | Admitting: Family Medicine

## 2019-11-29 ENCOUNTER — Other Ambulatory Visit: Payer: Self-pay

## 2019-11-29 ENCOUNTER — Telehealth: Payer: Self-pay | Admitting: Family Medicine

## 2019-11-29 ENCOUNTER — Ambulatory Visit (INDEPENDENT_AMBULATORY_CARE_PROVIDER_SITE_OTHER): Payer: Medicare HMO | Admitting: Family Medicine

## 2019-11-29 VITALS — BP 104/58 | HR 62 | Temp 97.4°F | Ht 65.0 in | Wt 164.5 lb

## 2019-11-29 DIAGNOSIS — I69852 Hemiplegia and hemiparesis following other cerebrovascular disease affecting left dominant side: Secondary | ICD-10-CM | POA: Diagnosis not present

## 2019-11-29 DIAGNOSIS — R195 Other fecal abnormalities: Secondary | ICD-10-CM

## 2019-11-29 DIAGNOSIS — G894 Chronic pain syndrome: Secondary | ICD-10-CM

## 2019-11-29 DIAGNOSIS — I10 Essential (primary) hypertension: Secondary | ICD-10-CM

## 2019-11-29 DIAGNOSIS — I700 Atherosclerosis of aorta: Secondary | ICD-10-CM | POA: Diagnosis not present

## 2019-11-29 DIAGNOSIS — K5730 Diverticulosis of large intestine without perforation or abscess without bleeding: Secondary | ICD-10-CM | POA: Diagnosis not present

## 2019-11-29 DIAGNOSIS — K6389 Other specified diseases of intestine: Secondary | ICD-10-CM | POA: Diagnosis not present

## 2019-11-29 DIAGNOSIS — I714 Abdominal aortic aneurysm, without rupture: Secondary | ICD-10-CM | POA: Diagnosis not present

## 2019-11-29 NOTE — Telephone Encounter (Signed)
Patient is needing to follow up within one month but the next available is not until late September, early October can we take same day for 01/02/2020

## 2019-11-29 NOTE — Progress Notes (Signed)
Phone [phone] In person visit   Subjective:   Kevin [name] is a 79 y.o. year old very pleasant male patient who presents for/with See problem oriented charting Chief Complaint  Patient presents with  . Fatigue   This visit occurred during the SARS-CoV-2 public health emergency.  Safety protocols were in place, including screening questions prior to the visit, additional usage of staff PPE, and extensive cleaning of exam room while observing appropriate contact time as indicated for disinfecting solutions.   Past Medical History-  Patient Active Problem List   Diagnosis Date Noted  . Parkinson's disease ([place]) 11/18/2019    Priority: High  . Afib ([place]) 07/06/2018    Priority: High  . 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 PAD (peripheral artery disease) ([place]) 01/08/2016    Priority: High  . Diastolic CHF ([place]) 51/88/4166    Priority: High  . CKD (chronic kidney disease), stage IV ([place]) 01/27/2014    Priority: High  . Carotid artery stenosis s/p L carotid endarterectomy 01/12/2012    Priority: High  . History of CVA (cerebrovascular accident) 11/17/2011    Priority: High  . Chronic pain syndrome 12/23/2009    Priority: High  . Constipation 09/19/2017    Priority: Medium  . Personal history of colonic polyps 05/15/2013    Priority: Medium  . Essential tremor 10/05/2009    Priority: Medium  . UNSPECIFIED ANEMIA 12/05/2008    Priority: Medium  . BPH (benign prostatic hyperplasia) 06/04/2007    Priority: Medium  . Fasting hyperglycemia 02/05/2007    Priority: Medium  . Hyperlipidemia 01/03/2007    Priority: Medium  . Essential hypertension 01/03/2007    Priority: Medium  . Hemiparesis ([place]) 04/16/2019    Priority: Low  . CAP (community acquired pneumonia) 03/17/2015    Priority: Low  . Family history of malignant neoplasm of gastrointestinal tract 05/15/2013    Priority: Low  . Inguinal hernia 05/01/2012    Priority: Low  . BURSITIS, HIP 12/21/2009     Priority: Low  . ACTINIC KERATOSIS, EAR, LEFT 03/09/2009    Priority: Low  . 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 Loss of weight 10/08/2019  . Greater trochanteric bursitis of both hips 02/26/2019  . Degenerative arthritis of knee, bilateral 01/14/2019  . SIRS (systemic inflammatory response syndrome) ([place]) 01/08/2019  . Chest pain 01/08/2019  . Atelectasis of left lung 10/30/2018  . Lumbar spondylosis 03/17/2017    Medications- reviewed and updated Current Outpatient Medications  Medication Sig Dispense Refill  . albuterol (VENTOLIN HFA) 108 (90 Base) MCG/ACT inhaler Inhale 2 puffs into the lungs every 12 (twelve) hours as needed for wheezing or shortness of breath. 1 each 6  . amLODipine (NORVASC) 10 MG tablet TAKE 1 TABLET EVERY DAY 90 tablet 1  . aspirin EC 81 MG EC tablet Take 1 tablet (81 mg total) by mouth daily.    Marland Kitchen atorvastatin (LIPITOR) 20 MG tablet TAKE 1 TABLET EVERY DAY 90 tablet 1  . BIOTIN PO Take 1 tablet by mouth daily.    . carbidopa-levodopa (SINEMET IR) 25-100 MG tablet Take 1 tablet by mouth 3 (three) times daily. 270 tablet 1  . Cholecalciferol (VITAMIN D-3) 1000 UNITS CAPS Take 1,000 Units by mouth daily.     . cilostazol (PLETAL) 50 MG tablet Take 1 tablet by mouth twice daily 60 tablet 0  . fenofibrate 160  MG tablet Take 1 tablet (160 mg total) by mouth daily. 90 tablet 3  . furosemide (LASIX) 20 MG tablet TAKE 1 TABLET EVERY DAY 90 tablet 1  . gabapentin (NEURONTIN) 400 MG capsule Take 1 capsule (400 mg total) by mouth 2 (two) times daily. 400 mg twice a day 180 capsule 3  . linaclotide (LINZESS) 145 MCG CAPS capsule Take 145 mcg by mouth as needed (for stomach).     . Melatonin 10 MG TABS Take 10 mg by mouth at bedtime.     . metoprolol tartrate (LOPRESSOR) 25 MG tablet Take 1.5 tablets by mouth as directed; take 1 tablet in the morning and 0.5 tablet in the evening 135 tablet 2   . oxyCODONE-acetaminophen (PERCOCET) 7.5-325 MG tablet Take 1 tablet by mouth every 8 (eight) hours as needed for severe pain. 70 tablet 0  . rosuvastatin (CRESTOR) 40 MG tablet Take 1 tablet (40 mg total) by mouth daily. 90 tablet 3  . tamsulosin (FLOMAX) 0.4 MG CAPS capsule Take 1 capsule (0.4 mg total) by mouth daily. 90 capsule 3  . tiZANidine (ZANAFLEX) 4 MG tablet TAKE 1 TABLET EVERY 6 HOURS AS NEEDED FOR MUSCLE SPASMS 90 tablet 1  . vitamin B-12 (CYANOCOBALAMIN) 1000 MCG tablet Take 1,000 mcg by mouth daily.     No current facility-administered medications for this visit.     Objective:  BP (!) 104/58   Pulse 62   Temp (!) 97.4 F (36.3 C)   Ht 5\' 5"  (1.651 m)   Wt 164 lb 8 oz (74.6 kg)   SpO2 95%   BMI 27.37 kg/m  Gen: NAD, resting comfortably CV: RRR no murmurs rubs or gallops Lungs: CTAB no crackles, wheeze, rhonchi Ext: no edema Skin: warm, dry Neuro: tremor better    Assessment and Plan   Other notes: 1.  Completed virtual colonoscopy earlier today-awaiting results  #Fatigue/tremor/polypharmacy concerns-treatment for chronic pain syndrome S: Patient was seen by Dr. Carles Collet of neurology who was concerned about polypharmacy contributing to fatigue, tremulousness, episodes of feeling pale and turning cold with some confusion.  She recommended trying to wean medicines such as gabapentin, diazepam, tizanidine, oxycodone  Dr. Carles Collet also started patient on Sinemet for mild Parkinson features-particular tremor and that seems to be better A/P: Patient does suffer from significant pain, muscle spasms, insomnia but I completely agree with Dr. Carles Collet we need to try to wean medications-patient and wife seem open to this and we will start with adjustments as below.  Patient is concerned that his cramping and sleep may worsen-his primary concerns -For mild Parkinson's-very thankful for the Sinemet and find this helpful.  This is also helping him eat better From AVS "  Lets stop diazepam.  If absolutely have to for first 3 days can do half tablet and then stop.   Update me in 2 weeks-I would consider going down to 300 mg gabapentin twice daily at that point from 400mg  twice daily.   Perhaps check back in together in 1 month or so.   We will keep trying to pull back meds as long as we can and quality of life doesn't suffer too much  Parathyroid hormone was high with Dr. Tamala Julian- please make sure you have follow up with kidney doctor Dr. Moshe Cipro and they can recheck and potentially treat  Recommended follow up: Return in about 1 month (around 12/30/2019) for follow up- or sooner if needed.  "   #Weight loss S: Patient is down 20 pounds in  about 3 months.  He feels like his appetite has been low overall but has started to improve and he is eating better.  He is even doing some chair exercises A/P: Close follow-up in 1 month.  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  #hypertension S: medication: metoprolol 1 in AM and 0.5 in evening and lasix 20mg  dialy. Slightly low today but even with amlodipine still on board at 10 mg seeing some #s into 140s and 150s as well.  BP Readings from Last 3 Encounters:  11/29/19 (!) 104/58  11/18/19 (!) 145/75  11/14/19 117/69  A/P: still a lot of variabilityand I do not think we can stop or reduce the amlodipine with #s getting into 150s at times-may have to reconsider if blood pressure gets any lower though. For now- continue current meds  with reasonable control today.   Recommended follow up: Return in about 1 month (around 12/30/2019) for follow up- or sooner if needed. Future Appointments  Date Time Provider [place]  12/09/2019  1:00 PM Lyndal Pulley, DO [facility] None  12/12/2019  8:00 AM [facility] NL VASC 2 [facility]  12/13/2019 10:20 AM Bayard Hugger, NP [facility] CPR  12/16/2019  7:30 AM [facility] EEG TECH [facility] None  12/18/2019  1:00 PM [facility]-315 MR 2 [facility]-315MRI [facility] [address] [facility]  01/17/2020 10:40 AM Fay Records, MD [facility]  03/26/2020  3:30 PM Tat, Eustace Quail, DO [facility] None   Lab/Order associations:   ICD-10-CM   1. Chronic pain syndrome  G89.4   2. Essential hypertension  I10   3. 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  Garret Reddish, MD

## 2019-11-29 NOTE — Patient Instructions (Addendum)
Health Maintenance Due  Topic Date Due  . COLONOSCOPY - just had virtual colonoscopy 06/20/2018   Lets stop diazepam. If absolutely have to for first 3 days can do half tablet and then stop.   Update me in 2 weeks-I would consider going down to 300 mg gabapentin twice daily at that point from 400mg  twice daily.   Perhaps check back in together in 1 month or so.   We will keep trying to pull back meds as long as we can and quality of life doesn't suffer too much  Parathyroid hormone was high with Dr. Tamala Julian- please make sure you have follow up with kidney doctor Dr. Moshe Cipro and they can recheck and potentially treat  Recommended follow up: Return in about 1 month (around 12/30/2019) for follow up- or sooner if needed.

## 2019-12-03 NOTE — Telephone Encounter (Signed)
Yes, this is fine.

## 2019-12-09 ENCOUNTER — Encounter: Payer: Self-pay | Admitting: Family Medicine

## 2019-12-09 ENCOUNTER — Other Ambulatory Visit: Payer: Self-pay

## 2019-12-09 ENCOUNTER — Ambulatory Visit: Payer: Medicare HMO | Admitting: Family Medicine

## 2019-12-09 DIAGNOSIS — M170 Bilateral primary osteoarthritis of knee: Secondary | ICD-10-CM

## 2019-12-09 NOTE — Progress Notes (Signed)
Kevin [address] [place] Phone: [phone] Subjective:   I Kandace [name] am serving as a Education administrator for Dr. Hulan Saas.  This visit occurred during the SARS-CoV-2 public health emergency.  Safety protocols were in place, including screening questions prior to the visit, additional usage of staff PPE, and extensive cleaning of exam room while observing appropriate contact time as indicated for disinfecting solutions.   I'm seeing this patient by the request  of:  Marin Olp, MD  CC: Chronic bilateral knee pain  ZDG:LOVFIEPPIR   10/08/2019 Chronic problem with exacerbation.  Left knee injected today.  Tolerated the procedure well.  With patient now having heme positive stool with a 17 pound weight loss encourage patient to follow-up with gastroenterology to see if this would need further be evaluated with a colonoscopy.  Will also send note to patient's primary care doctor.  Patient has many different comorbidities also noted including coronary artery disease and chronic kidney disease.  Makes it difficult to treat patient appropriately.  We discussed medication management including the gabapentin that we have given him previously as well as now given him Remeron secondary to the weight loss.  Update 12/09/2019 LAMARKUS NEBEL is a 79 y.o. male coming in with complaint of bilateral knee pain. Patient states the left knee is very painful and unstable. Hips are doing better. Injections helped. Bilateral knee injections. Patient is still dealing with his Parkinson's. Has had some medication changes recently. Continues to have difficulty with losing weight but seems to stabilize a little bit at this time. Patient has been seeing primary care doctor and is continuing to follow-up with his chronic pain doctor as well. Feels that his oxycodone may need to be changed soon.      Past Medical History:  Diagnosis Date   Arthritis    DDD- Lower  Back   Bradycardia    CAP (community acquired pneumonia) 03/17/2015   "THAT'S WHAT THEY ARE THINKING; THEY ARE NOT SURE"   Carotid artery occlusion    Chest pain 06/19/2018   Chronic lower back pain    DDD   CKD (chronic kidney disease)    Dr. 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      Muscle spasm    takes Zanaflex daily as needed   Peripheral edema    takes Furosemide daily   Pneumonia    hx of    Rheumatic fever 1946   Rheumatoid arthritis ([place])    Short-term memory loss    minimal   Shortness of breath dyspnea    with exertion but lasts very short period of time   TIA (transient ischemic attack)    x 2;takes Plavix daily   Urinary frequency    takes Flomax daily   Urinary urgency    Past  Surgical History:  Procedure Laterality Date   ABDOMINAL AORTIC ANEURYSM REPAIR  2010   CAROTID ENDARTERECTOMY Left 09/20/2004   CATARACT EXTRACTION, BILATERAL     2016-2017   COLONOSCOPY     CORONARY ARTERY BYPASS GRAFT N/A 06/27/2018   Procedure: CORONARY ARTERY BYPASS GRAFTING (CABG), ON PUMP, TIMES FOUR, USING LEFT INTERNAL MAMMARY ARTERY AND ENDOSCOPICALLY HARVESTED LEFT GREATER SAPHENOUS VEIN;  Surgeon: Melrose Nakayama, MD;  Location: [facility];  Service: Open Heart Surgery;  Laterality: N/A;   ENDARTERECTOMY Right 10/12/2015   Procedure: RIGHT CAROTID ENDARTERECTOMY WITH PATCH ANGIOPLASTY;  Surgeon:  Elam Dutch, MD;  Location: [facility];  Service: Vascular;  Laterality: Right;   INGUINAL HERNIA REPAIR Left 02/05/2013   Procedure: HERNIA REPAIR INGUINAL ADULT;  Surgeon: Joyice Faster. Cornett, MD;  Location: [facility];  Service: General;  Laterality: Left;   INGUINAL HERNIA REPAIR Left    INSERTION OF MESH Left 02/05/2013   Procedure: INSERTION OF MESH;  Surgeon: Joyice Faster. Cornett, MD;  Location: [facility];  Service: General;  Laterality: Left;   LEFT HEART CATH AND CORONARY ANGIOGRAPHY N/A 06/21/2018   Procedure: LEFT HEART CATH AND CORONARY ANGIOGRAPHY;  Surgeon: Jettie Booze, MD;  Location: [facility];  Service: Cardiovascular;  Laterality: N/A;   SHOULDER ARTHROSCOPY WITH ROTATOR CUFF REPAIR AND SUBACROMIAL DECOMPRESSION Left 05/01/2014   Procedure: LEFT ARTHROSCOPY SHOULDER SUBACROMIAL DECOMPRESSION,DISTAL CLAVICAL RESECTION AND ROTATOR CUFF REPAIR;  Surgeon: Marin Shutter, MD;  Location: [facility];  Service: Orthopedics;  Laterality: Left;   TEE WITHOUT CARDIOVERSION N/A 06/27/2018   Procedure: TRANSESOPHAGEAL ECHOCARDIOGRAM (TEE);  Surgeon: Melrose Nakayama, MD;  Location: [facility];  Service: Open Heart Surgery;  Laterality: N/A;   TESTICLAR CYST EXCISION Left    TONSILLECTOMY     VASECTOMY     Social History   Socioeconomic History   Marital status: Married    Spouse name: Not on file   Number of children: Not on file   Years of education: Not on file   Highest education level: Not on file  Occupational History   Occupation: retired    Comment: maintenance; electrician  Tobacco Use   Smoking status: Former Smoker    Packs/day: 2.00    Years: 28.00    Pack years: 56.00    Types: Cigarettes    Quit date: 05/31/1987    Years since quitting: 32.5   Smokeless tobacco: Never Used   Tobacco comment: quit smoking "in my 71's"  Vaping Use   Vaping Use: Never used  Substance and Sexual Activity   Alcohol use: Not Currently     Alcohol/week: 0.0 standard drinks    Comment: rare, mikes hard lemonade   Drug use: Not Currently    Types: Marijuana    Comment: hx of-4-5 yrs ago   Sexual activity: Not Currently  Other Topics Concern   Not on file  Social History Narrative   Married 33 years in 2015. Lived together for 12 years. 2 boys from first wife. 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       Hobbies: yardwork, [place] barn, woodwork, collect knive   Right handed   One story home   Caffeine yes   Social Determinants of Health   Financial Resource Strain:    Difficulty of Paying Living Expenses:  Food Insecurity:    Worried About Charity fundraiser in the Last Year:    Arboriculturist in the Last Year:   Transportation Needs:    Film/video editor (Medical):    Lack of Transportation (Non-Medical):   Physical Activity:    Days of Exercise per Week:    Minutes of Exercise per Session:   Stress:    Feeling of Stress :   Social Connections:    Frequency of Communication with Friends and Family:    Frequency of Social Gatherings with Friends and Family:    Attends Religious Services:    Active Member of Clubs or Organizations:    Attends Music therapist:    Marital Status:    Allergies  Allergen Reactions   Hydralazine Other (See Comments)    Pt stated he feels drunk   Mirtazapine Other (See Comments)    Very tired, feeling drunk, loopy   Family History  Problem Relation Age of Onset   Parkinsonism Father    Dementia Father    Cancer Father        Throat   Colon cancer Mother        died in 42s   Cancer Mother        Colon     Current Outpatient Medications (Cardiovascular):    amLODipine (NORVASC) 10 MG tablet, TAKE 1 TABLET EVERY DAY   atorvastatin (LIPITOR) 20 MG tablet, TAKE 1 TABLET EVERY DAY   fenofibrate 160 MG  tablet, Take 1 tablet (160 mg total) by mouth daily.   furosemide (LASIX) 20 MG tablet, TAKE 1 TABLET EVERY DAY   metoprolol tartrate (LOPRESSOR) 25 MG tablet, Take 1.5 tablets by mouth as directed; take 1 tablet in the morning and 0.5 tablet in the evening   rosuvastatin (CRESTOR) 40 MG tablet, Take 1 tablet (40 mg total) by mouth daily.  Current Outpatient Medications (Respiratory):    albuterol (VENTOLIN HFA) 108 (90 Base) MCG/ACT inhaler, Inhale 2 puffs into the lungs every 12 (twelve) hours as needed for wheezing or shortness of breath.  Current Outpatient Medications (Analgesics):    aspirin EC 81 MG EC tablet, Take 1 tablet (81 mg total) by mouth daily.   oxyCODONE-acetaminophen (PERCOCET) 7.5-325 MG tablet, Take 1 tablet by mouth every 8 (eight) hours as needed for severe pain.  Current Outpatient Medications (Hematological):    cilostazol (PLETAL) 50 MG tablet, Take 1 tablet by mouth twice daily   vitamin B-12 (CYANOCOBALAMIN) 1000 MCG tablet, Take 1,000 mcg by mouth daily.  Current Outpatient Medications (Other):    BIOTIN PO, Take 1 tablet by mouth daily.   carbidopa-levodopa (SINEMET IR) 25-100 MG tablet, Take 1 tablet by mouth 3 (three) times daily.   Cholecalciferol (VITAMIN D-3) 1000 UNITS CAPS, Take 1,000 Units by mouth daily.    gabapentin (NEURONTIN) 400 MG capsule, Take 1 capsule (400 mg total) by mouth 2 (two) times daily. 400 mg twice a day   linaclotide (LINZESS) 145 MCG CAPS capsule, Take 145 mcg by mouth as needed (for stomach).    Melatonin 10 MG TABS, Take 10 mg by mouth at bedtime.    tamsulosin (FLOMAX) 0.4 MG CAPS capsule, Take 1 capsule (0.4 mg total) by mouth daily.   tiZANidine (ZANAFLEX) 4 MG tablet, TAKE 1 TABLET EVERY 6 HOURS AS NEEDED FOR MUSCLE SPASMS   Reviewed prior external information including notes and imaging from  primary care provider As well as notes that were available from care  everywhere and other healthcare  systems.  Past medical history, social, surgical and family history all reviewed in electronic medical record.  No pertanent information unless stated regarding to the chief complaint.   Review of Systems:  No headache, visual changes, nausea, vomiting, diarrhea, constipation, dizziness, abdominal pain, skin rash, fevers, chills, night sweats, weight loss, swollen lymph nodes, chest pain, shortness of breath, mood changes. POSITIVE muscle aches, joint swelling, body aches  Objective  Blood pressure 100/82, pulse (!) 52, height 5\' 5"  (1.651 m), weight 166 lb (75.3 kg), SpO2 96 %.   General: No apparent distress alert and oriented x3 mood and affect normal, dressed appropriately. Mild masked facies noted. Very mild resting tremor noted in the right upper extremity. Knee: Bilateral valgus deformity noted. Abnormal thigh to calf ratio.  Tender to palpation over medial and PF joint line.  Mild loss of 10 degrees of flexion bilaterally. Patient does have mild cogwheeling noted with range of motion as well. instability with valgus force.  painful patellar compression. Patellar glide with moderate crepitus. Patellar and quadriceps tendons unremarkable. Hamstring and quadriceps strength is normal.   After informed written and verbal consent, patient was seated on exam table. Right knee was prepped with alcohol swab and utilizing anterolateral approach, patient's right knee space was injected with 4:1  marcaine 0.5%: Kenalog 40mg /dL. Patient tolerated the procedure well without immediate complications.  After informed written and verbal consent, patient was seated on exam table. Left knee was prepped with alcohol swab and utilizing anterolateral approach, patient's left knee space was injected with 4:1  marcaine 0.5%: Kenalog 40mg /dL. Patient tolerated the procedure well without immediate complications.    Impression and Recommendations:     The above documentation has been reviewed and is  accurate and complete Lyndal Pulley, DO       Note: This dictation was prepared with Dragon dictation along with smaller phrase technology. Any transcriptional errors that result from this process are unintentional.

## 2019-12-09 NOTE — Patient Instructions (Addendum)
Good to see you Happy Birthday! See me again in 8-10 weeks

## 2019-12-09 NOTE — Assessment & Plan Note (Signed)
Due to patient's chronic comorbidities chronic exacerbation of this chronic problem patient is injections are the safest thing for him at this time. Patient is on multiple different medications and is not a surgical candidate at this time either. Patient is on chronic pain medication by another provider as well. I do believe the underlying Parkinson's is also contributing. Unable to do oral anti-inflammatory secondary to social determinants of health and his chronic kidney disease as well as coronary artery disease. Patient can have these injections every 8 to 10 weeks if necessary. Has not responded well to viscosupplementation in the past but we can always repeat as well. Follow-up again in 8 to 10 weeks.

## 2019-12-12 ENCOUNTER — Ambulatory Visit (HOSPITAL_COMMUNITY)
Admission: RE | Admit: 2019-12-12 | Discharge: 2019-12-12 | Disposition: A | Discharge status: Home / Self Care | Payer: Medicare HMO | Source: Ambulatory Visit | Attending: Cardiovascular Disease | Admitting: Cardiovascular Disease

## 2019-12-12 ENCOUNTER — Other Ambulatory Visit: Payer: Self-pay | Admitting: *Deleted

## 2019-12-12 ENCOUNTER — Other Ambulatory Visit: Payer: Self-pay

## 2019-12-12 DIAGNOSIS — I714 Abdominal aortic aneurysm, without rupture, unspecified: Secondary | ICD-10-CM

## 2019-12-13 ENCOUNTER — Encounter: Payer: Medicare HMO | Attending: Physical Medicine & Rehabilitation | Admitting: Registered Nurse

## 2019-12-13 ENCOUNTER — Other Ambulatory Visit: Payer: Self-pay

## 2019-12-13 ENCOUNTER — Encounter: Payer: Self-pay | Admitting: Registered Nurse

## 2019-12-13 VITALS — BP 95/61 | HR 77 | Temp 97.8°F | Ht 65.0 in | Wt 163.0 lb

## 2019-12-13 DIAGNOSIS — I739 Peripheral vascular disease, unspecified: Secondary | ICD-10-CM | POA: Diagnosis not present

## 2019-12-13 DIAGNOSIS — Z5181 Encounter for therapeutic drug level monitoring: Secondary | ICD-10-CM | POA: Insufficient documentation

## 2019-12-13 DIAGNOSIS — G894 Chronic pain syndrome: Secondary | ICD-10-CM | POA: Diagnosis not present

## 2019-12-13 DIAGNOSIS — M1712 Unilateral primary osteoarthritis, left knee: Secondary | ICD-10-CM | POA: Insufficient documentation

## 2019-12-13 DIAGNOSIS — M47816 Spondylosis without myelopathy or radiculopathy, lumbar region: Secondary | ICD-10-CM

## 2019-12-13 DIAGNOSIS — Z79891 Long term (current) use of opiate analgesic: Secondary | ICD-10-CM | POA: Insufficient documentation

## 2019-12-13 DIAGNOSIS — M25551 Pain in right hip: Secondary | ICD-10-CM | POA: Insufficient documentation

## 2019-12-13 DIAGNOSIS — R54 Age-related physical debility: Secondary | ICD-10-CM

## 2019-12-13 MED ORDER — OXYCODONE-ACETAMINOPHEN 7.5-325 MG PO TABS: 1.0000 | ORAL_TABLET | Freq: Three times a day (TID) | ORAL | 0 refills | Status: DC | PRN

## 2019-12-13 NOTE — Progress Notes (Signed)
Subjective:    Patient ID: Kevin [name], male    DOB: Jun 11, 1940, 79 y.o.   MRN: [id number]  HPI: Kevin [name] is a 79 y.o. male who returns for follow up appointment for chronic pain and medication refill. He states his pain is located in his lower back radiating into his bilateral hips. Also reports increase intensity of lower back pain and only receiving 4 hours of relief with his current medication regimen. Kevin [name] dispenses his medication, we will increase his tablets this month and re-evaluate next month, they verbalize understanding. He rates his pain 8. His current exercise regime is walking and performing stretching exercises.  Mr. Tandy Morphine equivalent is 32.81 MME.  He is also prescribed Diazepam  by Dr. [name]. We have discussed the black box warning of using opioids and benzodiazepines. I highlighted the dangers of using these drugs together and discussed the adverse events including respiratory suppression, overdose, cognitive impairment and importance of compliance with current regimen. We will continue to monitor and adjust as indicated.     Pain Inventory Average Pain 7 Pain Right Now 8 My pain is constant, aching and "spine twisting"  In the last 24 hours, has pain interfered with the following? General activity 10 Relation with others 10 Enjoyment of life 10 What TIME of day is your pain at its worst? All the time. Sleep (in general) Fair  Pain is worse with: walking, bending, sitting and standing Pain improves with: heat/ice and medication Relief from Meds: 4  Mobility use a cane use a walker how many minutes can you walk? 3 mins ability to climb steps?  yes do you drive?  no Do you have any goals in this area?  yes  Function retired I need assistance with the following:  dressing, toileting, meal prep, household duties and shopping Do you have any goals in this area?  yes  Neuro/Psych weakness tremor trouble  walking dizziness confusion depression  Prior Studies Any changes since last visit?  yes CT/MRI 12/13/2019 at [facility] involved in your care Any changes since last visit?  no   Family History  Problem Relation Age of Onset  . Parkinsonism Father   . Dementia Father   . Cancer Father        Throat  . Colon cancer Mother        died in 33s  . Cancer Mother        Colon   Social History   Socioeconomic History  . Marital status: Married    Spouse name: Not on file  . Number of children: Not on file  . Years of education: Not on file  . Highest education level: Not on file  Occupational History  . Occupation: retired    Comment: maintenance; electrician  Tobacco Use  . Smoking status: Former Smoker    Packs/day: 2.00    Years: 28.00    Pack years: 56.00    Types: Cigarettes    Quit date: 05/31/1987    Years since quitting: 32.5  . Smokeless tobacco: Never Used  . Tobacco comment: quit smoking "in my 40's"  Vaping Use  . Vaping Use: Never used  Substance and Sexual Activity  . Alcohol use: Not Currently    Alcohol/week: 0.0 standard drinks    Comment: rare, mikes hard lemonade  . Drug use: Not Currently    Types: Marijuana    Comment: hx of-4-5 yrs ago  . Sexual activity: Not Currently  Other Topics  Concern  . Not on file  Social History Narrative   Married 33 years in 2015. Lived together for 12 years. 2 boys from first wife. 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       Hobbies: yardwork, [place] barn, woodwork, collect knive   Right handed   One story home   Caffeine yes   Social Determinants of Health   Financial Resource Strain:   . Difficulty of Paying Living Expenses:   Food Insecurity:   . Worried About Charity fundraiser in the Last Year:   . Arboriculturist in the Last Year:   Transportation Needs:   . Lexicographer (Medical):   Marland Kitchen Lack of Transportation (Non-Medical):   Physical Activity:   . Days of Exercise per Week:   . Minutes of Exercise per Session:   Stress:   . Feeling of Stress :   Social Connections:   . Frequency of Communication with Friends and Family:   . Frequency of Social Gatherings with Friends and Family:   . Attends Religious Services:   . Active Member of Clubs or Organizations:   . Attends Archivist Meetings:   Marland Kitchen Marital Status:    Past Surgical History:  Procedure Laterality Date  . ABDOMINAL AORTIC ANEURYSM REPAIR  2010  . CAROTID ENDARTERECTOMY Left 09/20/2004  . CATARACT EXTRACTION, BILATERAL     2016-2017  . COLONOSCOPY    . 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 ENDARTERECTOMY Right 10/12/2015   Procedure: RIGHT CAROTID ENDARTERECTOMY WITH PATCH ANGIOPLASTY;  Surgeon: Elam Dutch, MD;  Location: [facility];  Service: Vascular;  Laterality: Right;  . INGUINAL HERNIA REPAIR Left 02/05/2013   Procedure: HERNIA REPAIR INGUINAL ADULT;  Surgeon: Joyice Faster. Cornett, MD;  Location: [facility];  Service: General;  Laterality: Left;  . INGUINAL HERNIA REPAIR Left   . INSERTION OF MESH Left 02/05/2013   Procedure: INSERTION OF MESH;  Surgeon: Joyice Faster. Cornett, MD;  Location: [facility];  Service: General;  Laterality: Left;  . LEFT HEART CATH AND CORONARY ANGIOGRAPHY N/A 06/21/2018   Procedure: LEFT HEART CATH AND CORONARY ANGIOGRAPHY;  Surgeon: Jettie Booze, MD;  Location: [facility];  Service: Cardiovascular;  Laterality: N/A;  . SHOULDER ARTHROSCOPY WITH ROTATOR CUFF REPAIR AND SUBACROMIAL DECOMPRESSION Left 05/01/2014   Procedure: LEFT  ARTHROSCOPY SHOULDER SUBACROMIAL DECOMPRESSION,DISTAL CLAVICAL RESECTION AND ROTATOR CUFF REPAIR;  Surgeon: Marin Shutter, MD;  Location: [facility];  Service: Orthopedics;  Laterality: Left;  . TEE WITHOUT CARDIOVERSION N/A 06/27/2018   Procedure: TRANSESOPHAGEAL ECHOCARDIOGRAM (TEE);  Surgeon: Melrose Nakayama, MD;  Location: [facility];  Service: Open Heart Surgery;  Laterality: N/A;  . TESTICLAR CYST EXCISION Left   . TONSILLECTOMY    . VASECTOMY     Past Medical History:  Diagnosis Date  . Arthritis    DDD- Lower Back  . Bradycardia   . CAP (community acquired pneumonia) 03/17/2015   "THAT'S WHAT THEY ARE THINKING; THEY ARE NOT SURE"  . Carotid artery occlusion   .  Chest pain 06/19/2018  . Chronic lower back pain    DDD  . CKD (chronic kidney disease)    Dr. Corliss Parish  . 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 Diverticulosis   . Enlarged prostate    takes Flomax daily  . History of blood transfusion    "probably when they did aortic aneurysm"  . History of colon polyps    benign  . Hyperlipidemia   . Hypertension    takes Amlodipine and Zebeta daily  . Insomnia    takes Melatonin nightly  . Joint pain   . 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     . Muscle spasm    takes Zanaflex daily as needed  . Peripheral edema    takes Furosemide daily  . Pneumonia    hx of   . Rheumatic fever 1946  . Rheumatoid arthritis ([place])   . Short-term memory loss    minimal  . Shortness of breath dyspnea    with exertion but lasts very short period of time  . TIA (transient ischemic attack)    x 2;takes Plavix daily  . Urinary frequency    takes Flomax daily  . Urinary urgency    BP 95/61   Pulse 77   Temp 97.8 F (36.6 C)   Ht 5\' 5"  (1.651 m)   Wt 163 lb (73.9 kg)   SpO2 97%   BMI  27.12 kg/m   Opioid Risk Score:   Fall Risk Score:  `1  Depression screen PHQ 2/9  Depression screen [facility] 2/9 11/14/2019 05/09/2019 04/16/2019 04/16/2019 08/09/2018 04/16/2018 01/22/2018  Decreased Interest 0 1 0 0 0 0 0  Down, Depressed, Hopeless 0 1 0 0 0 0 0  PHQ - 2 Score 0 2 0 0 0 0 0  Altered sleeping - 0 - - - - -  Tired, decreased energy - 3 - - - - -  Change in appetite - 1 - - - - -  Feeling bad or failure about yourself  - 1 - - - - -  Trouble concentrating - 1 - - - - -  Moving slowly or fidgety/restless - 0 - - - - -  PHQ-9 Score - 8 - - - - -  Some recent data might be hidden   Review of Systems  Constitutional: Negative.   HENT: Negative.   Eyes: Negative.   Respiratory: Negative.   Cardiovascular: Negative.   Gastrointestinal: Negative.   Endocrine: Negative.   Genitourinary: Negative.   Musculoskeletal: Positive for back pain and gait problem.  Skin: Negative.   Allergic/Immunologic: Negative.   Neurological: Positive for dizziness, tremors and weakness.  Hematological: Bruises/bleeds easily.  Psychiatric/Behavioral:       Depression       Objective:   Physical Exam Vitals and nursing note reviewed.  Constitutional:      Appearance: He is ill-appearing.     Comments: Frail  Cardiovascular:     Rate and Rhythm: Normal rate and regular rhythm.     Pulses: Normal pulses.     Heart sounds: Normal heart sounds.  Pulmonary:     Effort: Pulmonary effort is normal.     Breath sounds: Normal breath sounds.  Musculoskeletal:     Cervical back: Normal range of motion and  neck supple.     Comments: Normal Muscle Bulk and Muscle Testing Reveals:  Upper Extremities: Full ROM and Muscle Strength 5/5 Lumbar Paraspinal Tenderness: L-3-L-5 Lower Extremities: Full ROM and Muscle Strength 5/5 Arises from Table slowly using cane for support Narrow Based  Gait   Skin:    General: Skin is warm and dry.  Neurological:     Mental Status: He is alert and oriented to  person, place, and time.  Psychiatric:        Mood and Affect: Mood normal.        Behavior: Behavior normal.           Assessment & Plan:  1. Lumbar Spondylosis/ Lumbar Stenosis:12/13/2019 Continue current medication regime. Refilled:Increased: Oxycodone 7.5/325 mg one tabletevery 8 hoursas needed #80.12/13/2019 We will continue the opioid monitoring program, this consists of regular clinic visits, examinations, urine drug screen, pill counts as well as use of [place] Controlled Substance Reporting System. 2.Bilateral Knees OA/ChronicBilateralKnee Pain:S/PBilateral KneeInjection by Dr Tamala Julian  on 12/09/2019, Orthopedist Following 12/09/2019. 3.BilateralGreater Trochanter Bursitis:No complaints today.Orthopedist Following.Continue to alternate Ice and Heat Therapy.12/09/2019  F/U in 1 month  20 minutes of face to face patient care time was spent during this visit. All questions were encouraged and answered.

## 2019-12-16 ENCOUNTER — Other Ambulatory Visit: Payer: Self-pay

## 2019-12-16 ENCOUNTER — Ambulatory Visit (INDEPENDENT_AMBULATORY_CARE_PROVIDER_SITE_OTHER): Payer: Medicare HMO | Admitting: Neurology

## 2019-12-16 DIAGNOSIS — G9340 Encephalopathy, unspecified: Secondary | ICD-10-CM

## 2019-12-18 ENCOUNTER — Other Ambulatory Visit: Payer: Self-pay

## 2019-12-18 ENCOUNTER — Ambulatory Visit
Admission: RE | Admit: 2019-12-18 | Discharge: 2019-12-18 | Disposition: A | Discharge status: Home / Self Care | Payer: Medicare HMO | Source: Ambulatory Visit | Attending: Neurology | Admitting: Neurology

## 2019-12-18 DIAGNOSIS — D1802 Hemangioma of intracranial structures: Secondary | ICD-10-CM | POA: Diagnosis not present

## 2019-12-18 DIAGNOSIS — I6782 Cerebral ischemia: Secondary | ICD-10-CM | POA: Diagnosis not present

## 2019-12-18 DIAGNOSIS — G319 Degenerative disease of nervous system, unspecified: Secondary | ICD-10-CM | POA: Diagnosis not present

## 2019-12-18 DIAGNOSIS — G9340 Encephalopathy, unspecified: Secondary | ICD-10-CM

## 2019-12-18 DIAGNOSIS — G9389 Other specified disorders of brain: Secondary | ICD-10-CM | POA: Diagnosis not present

## 2019-12-19 ENCOUNTER — Encounter: Payer: Self-pay | Admitting: Family Medicine

## 2019-12-19 ENCOUNTER — Telehealth: Payer: Self-pay

## 2019-12-19 ENCOUNTER — Telehealth (INDEPENDENT_AMBULATORY_CARE_PROVIDER_SITE_OTHER): Payer: Medicare HMO | Admitting: Family Medicine

## 2019-12-19 VITALS — BP 133/73 | HR 79

## 2019-12-19 DIAGNOSIS — R0981 Nasal congestion: Secondary | ICD-10-CM | POA: Diagnosis not present

## 2019-12-19 DIAGNOSIS — H04203 Unspecified epiphora, bilateral lacrimal glands: Secondary | ICD-10-CM

## 2019-12-19 NOTE — Progress Notes (Signed)
Virtual Visit via Video Note  I connected with Kevin [name]  on 12/19/19 at 10:20 AM EDT by a video enabled telemedicine application and verified that I am speaking with the correct person using two identifiers.  Location patient: home, [place] Location provider:work or home office Persons participating in the virtual visit: patient, provider  I discussed the limitations of evaluation and management by telemedicine and the availability of in person appointments. The patient expressed understanding and agreed to proceed.   HPI:  Acute visit for nasal congestion: -the last 2 days -symptoms include: watery and irritated eyes, sinus congestion, sneezing a little - denies fever, cough, SOB, malaise, body aches -has been outside a lot - no known sick contacts -fully vaccinated for covid19   ROS: See pertinent positives and negatives per HPI.  Past Medical History:  Diagnosis Date   Arthritis    DDD- Lower Back   Bradycardia    CAP (community acquired pneumonia) 03/17/2015   "THAT'S WHAT THEY ARE THINKING; THEY ARE NOT SURE"   Carotid artery occlusion    Chest pain 06/19/2018   Chronic lower back pain    DDD   CKD (chronic kidney disease)    Dr. 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      Muscle spasm    takes Zanaflex daily as needed   Peripheral edema    takes Furosemide daily   Pneumonia    hx of    Rheumatic fever 1946    Rheumatoid arthritis ([place])    Short-term memory loss    minimal   Shortness of breath dyspnea    with exertion but lasts very short period of time   TIA (transient ischemic attack)    x 2;takes Plavix daily   Urinary frequency    takes Flomax daily   Urinary urgency     Past Surgical History:  Procedure Laterality Date   ABDOMINAL AORTIC ANEURYSM REPAIR  2010   CAROTID ENDARTERECTOMY Left 09/20/2004   CATARACT EXTRACTION, BILATERAL     2016-2017   COLONOSCOPY     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 Cornett, MD;  Location: [facility];  Service: General;  Laterality: Left;   INGUINAL HERNIA REPAIR Left    INSERTION OF MESH Left 02/05/2013   Procedure: INSERTION OF MESH;  Surgeon: Joyice Faster.  Cornett, MD;  Location: [facility];  Service: General;  Laterality: Left;   LEFT HEART CATH AND CORONARY ANGIOGRAPHY N/A 06/21/2018   Procedure: LEFT HEART CATH AND CORONARY ANGIOGRAPHY;  Surgeon: Jettie Booze, MD;  Location: [facility];  Service: Cardiovascular;  Laterality: N/A;   SHOULDER ARTHROSCOPY WITH ROTATOR CUFF REPAIR AND SUBACROMIAL DECOMPRESSION Left 05/01/2014   Procedure: LEFT ARTHROSCOPY SHOULDER SUBACROMIAL DECOMPRESSION,DISTAL CLAVICAL RESECTION AND ROTATOR CUFF REPAIR;  Surgeon: Marin Shutter, MD;  Location: [facility];  Service: Orthopedics;  Laterality: Left;   TEE  WITHOUT CARDIOVERSION N/A 06/27/2018   Procedure: TRANSESOPHAGEAL ECHOCARDIOGRAM (TEE);  Surgeon: Melrose Nakayama, MD;  Location: [facility];  Service: Open Heart Surgery;  Laterality: N/A;   TESTICLAR CYST EXCISION Left    TONSILLECTOMY     VASECTOMY      Family History  Problem Relation Age of Onset   Parkinsonism Father    Dementia Father    Cancer Father        Throat   Colon cancer Mother        died in 3s   Cancer Mother        Colon    SOCIAL HX: see hpi   Current Outpatient Medications:    albuterol (VENTOLIN HFA) 108 (90 Base) MCG/ACT inhaler, Inhale 2 puffs into the lungs every 12 (twelve) hours as needed for wheezing or shortness of breath., Disp: 1 each, Rfl: 6   amLODipine (NORVASC) 10 MG tablet, TAKE 1 TABLET EVERY DAY, Disp: 90 tablet, Rfl: 1   aspirin EC 81 MG EC tablet, Take 1 tablet (81 mg total) by mouth daily., Disp: , Rfl:    atorvastatin (LIPITOR) 20 MG tablet, TAKE 1 TABLET EVERY DAY, Disp: 90 tablet, Rfl: 1   BIOTIN PO, Take 1 tablet by mouth daily., Disp: , Rfl:    carbidopa-levodopa (SINEMET IR) 25-100 MG tablet, Take 1 tablet by mouth 3 (three) times daily., Disp: 270 tablet, Rfl: 1   Cholecalciferol (VITAMIN D-3) 1000 UNITS CAPS, Take 1,000 Units by mouth daily. , Disp: , Rfl:    cilostazol (PLETAL) 50 MG tablet, Take 1 tablet by mouth twice daily, Disp: 60 tablet, Rfl: 0   fenofibrate 160 MG tablet, Take 1 tablet (160 mg total) by mouth daily., Disp: 90 tablet, Rfl: 3   furosemide (LASIX) 20 MG tablet, TAKE 1 TABLET EVERY DAY, Disp: 90 tablet, Rfl: 1   gabapentin (NEURONTIN) 400 MG capsule, Take 1 capsule (400 mg total) by mouth 2 (two) times daily. 400 mg twice a day, Disp: 180 capsule, Rfl: 3   linaclotide (LINZESS) 145 MCG CAPS capsule, Take 145 mcg by mouth as needed (for stomach). , Disp: , Rfl:    Melatonin 10 MG TABS, Take 10 mg by mouth at bedtime. , Disp: , Rfl:    metoprolol tartrate (LOPRESSOR) 25 MG tablet, Take 1.5  tablets by mouth as directed; take 1 tablet in the morning and 0.5 tablet in the evening, Disp: 135 tablet, Rfl: 2   oxyCODONE-acetaminophen (PERCOCET) 7.5-325 MG tablet, Take 1 tablet by mouth every 8 (eight) hours as needed for severe pain., Disp: 80 tablet, Rfl: 0   rosuvastatin (CRESTOR) 40 MG tablet, Take 1 tablet (40 mg total) by mouth daily., Disp: 90 tablet, Rfl: 3   tamsulosin (FLOMAX) 0.4 MG CAPS capsule, Take 1 capsule (0.4 mg total) by mouth daily., Disp: 90 capsule, Rfl: 3   tiZANidine (ZANAFLEX) 4 MG tablet, TAKE 1 TABLET EVERY 6 HOURS AS  NEEDED FOR MUSCLE SPASMS, Disp: 90 tablet, Rfl: 1   vitamin B-12 (CYANOCOBALAMIN) 1000 MCG tablet, Take 1,000 mcg by mouth daily., Disp: , Rfl:   EXAM:  VITALS per patient if applicable:  GENERAL: alert, oriented, appears well and in no acute distress  HEENT: atraumatic, conjunttiva clear, no obvious abnormalities on inspection of external nose and ears  NECK: normal movements of the head and neck  LUNGS: on inspection no signs of respiratory distress, breathing rate appears normal, no obvious gross SOB, gasping or wheezing  CV: no obvious cyanosis  MS: moves all visible extremities without noticeable abnormality  PSYCH/NEURO: pleasant and cooperative, no obvious depression or anxiety, speech and thought processing grossly intact  ASSESSMENT AND PLAN:  Discussed the following assessment and plan:  Nasal congestion  Watery eyes  -we discussed possible serious and likely etiologies, options for evaluation and workup, limitations of telemedicine visit vs in person visit, treatment, treatment risks and precautions. Pt prefers to treat via telemedicine empirically rather then risking or undertaking an in person visit at this moment. Suspect allergic or irritant related rhinitis and eye irritation most likely vs other. The air quality in our area has been poor the last several days and the patient has been outside. Advise staying  indoors as much as possible if the air quality is poor, nasal saline eye saline, trial allegra 10mg  daily for 1 week.  Patient agrees to seek prompt in person care if worsening, new symptoms arise, or if is not improving with treatment.   I discussed the assessment and treatment plan with the patient. The patient was provided an opportunity to ask questions and all were answered. The patient agreed with the plan and demonstrated an understanding of the instructions.   The patient was advised to call back or seek an in-person evaluation if the symptoms worsen or if the condition fails to improve as anticipated.   Lucretia Kern, DO

## 2019-12-19 NOTE — Telephone Encounter (Signed)
Spoke with pt wife informed her that MRI brain did not show any evidence of tumor, new stroke, or bleed. It showed age-related changes and some hardening of the small blood vessels in the brain seen in patient with blood pressure or cholesterol issues. Verbalized understanding

## 2019-12-19 NOTE — Telephone Encounter (Signed)
-----   Message from Cameron Sprang, MD sent at 12/19/2019 12:01 PM EDT ----- Pls let patient know the MRI brain did not show any evidence of tumor, new stroke, or bleed. It showed age-related changes and some hardening of the small blood vessels in the brain seen in patient with blood pressure or cholesterol issues. Thanks

## 2019-12-21 ENCOUNTER — Other Ambulatory Visit: Payer: Self-pay | Admitting: Family Medicine

## 2019-12-23 ENCOUNTER — Other Ambulatory Visit: Payer: Self-pay

## 2019-12-30 NOTE — Procedures (Signed)
ELECTROENCEPHALOGRAM REPORT  Dates of Recording: 12/16/2019 7:59AM to 12/18/2019 8:08AM  Patient's Name: Kevin [name] MRN: [id number] Date of Birth: 17-May-1941  Referring Provider: Dr. Wells Guiles Tat  Procedure: 48-hour ambulatory video EEG  History: This is an 79 year old man with episodes of altered mental status. EEG for classification.  Medications:  VENTOLIN HFA 108 (90 Base) MCG/ACT inhaler NORVASC 10 MG tablet LIPITOR 20 MG tablet VITAMIN D-3 1000 UNITS CAPS BIOTIN PO PLETAL 50 MG tablet VALIUM 5 MG tablet fenofibrate 160 MG tablet LASIX 20 MG tablet NEURONTIN 400 MG capsule LINZESS 145 MCG CAPS capsule Melatonin 10 MG TABS LOPRESSOR 25 MG tablet PERCOCET 7.5-325 MG tablet CRESTOR 40 MG tablet FLOMAX 0.4 MG CAPS capsule ZANAFLEX 4 MG tablet CYANOCOBALAMIN 1000 MCG tablet   Technical Summary: This is a 48-hour multichannel digital video EEG recording measured by the international 10-20 system with electrodes applied with paste and impedances below 5000 ohms performed as portable with EKG monitoring.  The digital EEG was referentially recorded, reformatted, and digitally filtered in a variety of bipolar and referential montages for optimal display.    DESCRIPTION OF RECORDING: During maximal wakefulness, the background activity consisted of a symmetric 8 Hz posterior dominant rhythm which was reactive to eye opening.  There were no epileptiform discharges or focal slowing seen in wakefulness.  During the recording, the patient progresses through wakefulness, drowsiness, and Stage 2 sleep. During drowsiness and sleep, there is an increase in theta slowing of the background, with shifting asymmetry over the bilateral temporal regions, at times sharply contoured without clear epileptogenic potential.  Again, there were no clear epileptiform discharges seen.   Events: On 7/19 at 2018 hours, he has a dizzy spell. He is walking from the kitchen and holds on to the wall.  Electrographically, there were no EEG or EKG changes seen.  On 7/19 at 2047 hours, he reports pain in the right side/sight loss for a few seconds. He is sitting on the recliner, no clinical changes seen. Electrographically, there were no EEG or EKG changes seen.  On 7/20 at 0811 hours, he has "several headache." He is lying on recliner. Electrographically, there were no EEG or EKG changes seen.  On 7/20 at 1010 hours, he has right eye pain behind the eye. He is sitting on the recliner with no clinical changes seen. Electrographically, there were no EEG or EKG changes seen.  On 7/10 at 1506 hours, he reports pain behind right eye. He is sitting on recliner with no clinical changes seen. Electrographically, there were no EEG or EKG changes seen.  On 7/10 at 1836 hours, he has pain behind right eye. He is sitting on recliner with no clinical changes seen. Electrographically, there were no EEG or EKG changes seen.   There were no electrographic seizures seen.  EKG lead was unremarkable.   IMPRESSION: This 48-hour ambulatory video EEG study is within normal limits.    CLINICAL CORRELATION: A normal EEG does not exclude a clinical diagnosis of epilepsy. Typical events were not captured. Episodes of dizzy spell, right eye pain, headaches, did not show EEG correlate. If further clinical questions remain, inpatient video EEG monitoring may be helpful.   Ellouise Newer, M.D.

## 2020-01-02 ENCOUNTER — Ambulatory Visit (INDEPENDENT_AMBULATORY_CARE_PROVIDER_SITE_OTHER): Payer: Medicare HMO | Admitting: Family Medicine

## 2020-01-02 ENCOUNTER — Encounter: Payer: Self-pay | Admitting: Family Medicine

## 2020-01-02 ENCOUNTER — Other Ambulatory Visit: Payer: Self-pay

## 2020-01-02 VITALS — BP 134/72 | HR 69 | Temp 98.0°F | Ht 65.0 in | Wt 164.0 lb

## 2020-01-02 DIAGNOSIS — I10 Essential (primary) hypertension: Secondary | ICD-10-CM

## 2020-01-02 DIAGNOSIS — I6523 Occlusion and stenosis of bilateral carotid arteries: Secondary | ICD-10-CM | POA: Diagnosis not present

## 2020-01-02 DIAGNOSIS — G894 Chronic pain syndrome: Secondary | ICD-10-CM | POA: Diagnosis not present

## 2020-01-02 DIAGNOSIS — Z8679 Personal history of other diseases of the circulatory system: Secondary | ICD-10-CM

## 2020-01-02 DIAGNOSIS — G20 Parkinson's disease: Secondary | ICD-10-CM | POA: Diagnosis not present

## 2020-01-02 MED ORDER — GABAPENTIN 100 MG PO CAPS
ORAL_CAPSULE | ORAL | 3 refills | Status: DC
Start: 2020-01-02 — End: 2020-03-09

## 2020-01-02 NOTE — Patient Instructions (Addendum)
So glad you did well with the last medication changes from last time.   We are going to work on stopping your Gabapentin. We would like to try at the following schedule. We are calling in a refill for the 100mg  to help aid with this change.  Gabapentin 300mg  twice a day for two weeks.  Then go to 200mg  twice a day for two weeks.  Then 100mg  twice a day for two weeks.  If at any time you start to have increased pain let our office now and do not go down any lower.   We are also going to stop:  Vitamin D Biotin  B-12  Blood pressure: continue blood pressure medications and daily Asprin. Continue to check if you have consistent readings over 140/90 let our office know.   Cholesterol: continue Crestor 40 mg daily.    Make sure that you keep your appointment with the eye doctor to see if he needs further imaging on his eye.   EEG did not show there was any abnormalities. You may want to make a follow up with Neurology to see if there are any other treatment options do to not being able to continue on the Sinemet.   If you have any questions or concerns before your next scheduled appointment please give our office a call.   Call Vascular to set up appointment Phone: [phone]   Recommended follow up: No follow-ups on file.

## 2020-01-02 NOTE — Progress Notes (Signed)
Phone [phone] In person visit   Subjective:   Kevin [name] is a 79 y.o. year old very pleasant male patient who presents for/with See problem oriented charting Chief Complaint  Patient presents with  . Follow-up    on med chagnes     This visit occurred during the SARS-CoV-2 public health emergency.  Safety protocols were in place, including screening questions prior to the visit, additional usage of staff PPE, and extensive cleaning of exam room while observing appropriate contact time as indicated for disinfecting solutions.   Past Medical History-  Patient Active Problem List   Diagnosis Date Noted  . Parkinson's disease ([place]) 11/18/2019    Priority: High  . Afib ([place]) 07/06/2018    Priority: High  . 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 PAD (peripheral artery disease) ([place]) 01/08/2016    Priority: High  . Diastolic CHF ([place]) 67/34/1937    Priority: High  . CKD (chronic kidney disease), stage IV ([place]) 01/27/2014    Priority: High  . Carotid artery stenosis s/p L carotid endarterectomy 01/12/2012    Priority: High  . History of CVA (cerebrovascular accident) 11/17/2011    Priority: High  . Chronic pain syndrome 12/23/2009    Priority: High  . Constipation 09/19/2017    Priority: Medium  . Personal history of colonic polyps 05/15/2013    Priority: Medium  . Essential tremor 10/05/2009    Priority: Medium  . UNSPECIFIED ANEMIA 12/05/2008    Priority: Medium  . BPH (benign prostatic hyperplasia) 06/04/2007    Priority: Medium  . Fasting hyperglycemia 02/05/2007    Priority: Medium  . Hyperlipidemia 01/03/2007    Priority: Medium  . Essential hypertension 01/03/2007    Priority: Medium  . Hemiparesis ([place]) 04/16/2019    Priority: Low  . CAP (community acquired pneumonia) 03/17/2015    Priority: Low  . Family history of malignant neoplasm of gastrointestinal tract 05/15/2013    Priority: Low  . Inguinal hernia 05/01/2012    Priority: Low  . BURSITIS,  HIP 12/21/2009    Priority: Low  . ACTINIC KERATOSIS, EAR, LEFT 03/09/2009    Priority: Low  . 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 Loss of weight 10/08/2019  . Greater trochanteric bursitis of both hips 02/26/2019  . Degenerative arthritis of knee, bilateral 01/14/2019  . SIRS (systemic inflammatory response syndrome) ([place]) 01/08/2019  . Chest pain 01/08/2019  . Atelectasis of left lung 10/30/2018  . Lumbar spondylosis 03/17/2017    Medications- reviewed and updated Current Outpatient Medications  Medication Sig Dispense Refill  . albuterol (VENTOLIN HFA) 108 (90 Base) MCG/ACT inhaler Inhale 2 puffs into the lungs every 12 (twelve) hours as needed for wheezing or shortness of breath. 1 each 6  . amLODipine (NORVASC) 10 MG tablet TAKE 1 TABLET EVERY DAY 90 tablet 1  . aspirin EC 81 MG EC tablet Take 1 tablet (81 mg total) by mouth daily.    Marland Kitchen atorvastatin (LIPITOR) 20 MG tablet TAKE 1 TABLET EVERY DAY 90 tablet 1  . BIOTIN PO Take 1 tablet by mouth daily.    . Cholecalciferol (VITAMIN D-3) 1000 UNITS CAPS Take 1,000 Units by mouth daily.     . cilostazol (PLETAL) 50 MG tablet Take 1 tablet by mouth twice daily 60 tablet 0  . fenofibrate 160 MG tablet Take 1 tablet (160 mg total) by mouth daily. [place]  tablet 3  . furosemide (LASIX) 20 MG tablet TAKE 1 TABLET EVERY DAY 90 tablet 1  . gabapentin (NEURONTIN) 400 MG capsule Take 1 capsule (400 mg total) by mouth 2 (two) times daily. 400 mg twice a day 180 capsule 3  . linaclotide (LINZESS) 145 MCG CAPS capsule Take 145 mcg by mouth as needed (for stomach).     . Melatonin 10 MG TABS Take 10 mg by mouth at bedtime.     . metoprolol tartrate (LOPRESSOR) 25 MG tablet Take 1.5 tablets by mouth as directed; take 1 tablet in the morning and 0.5 tablet in the evening 135 tablet 2  . oxyCODONE-acetaminophen (PERCOCET) 7.5-325 MG tablet Take 1 tablet by mouth every 8  (eight) hours as needed for severe pain. 80 tablet 0  . rosuvastatin (CRESTOR) 40 MG tablet Take 1 tablet (40 mg total) by mouth daily. 90 tablet 3  . tamsulosin (FLOMAX) 0.4 MG CAPS capsule Take 1 capsule (0.4 mg total) by mouth daily. 90 capsule 3  . tiZANidine (ZANAFLEX) 4 MG tablet TAKE 1 TABLET EVERY 6 HOURS AS NEEDED FOR MUSCLE SPASMS 90 tablet 1  . vitamin B-12 (CYANOCOBALAMIN) 1000 MCG tablet Take 1,000 mcg by mouth daily.     No current facility-administered medications for this visit.     Objective:  BP 134/72   Pulse 69   Temp 98 F (36.7 C) (Temporal)   Ht _0  (1.651 m)   Wt 164 lb (74.4 kg)   SpO2 98%   BMI 27.29 kg/m  Gen: NAD, resting comfortably CV: RRR no murmurs rubs or gallops Lungs: CTAB no crackles, wheeze, rhonchi Ext: no edema Neuro: Tremor in hands at times    Assessment and Plan   #Fatigue/tremor/polypharmacy-treatment for chronic pain syndrome S: We saw patient last month after Dr. Carles Collet had seen patient and was concerned his fatigue, tremulousness, episodes of feeling pale and turning cold could be related to polypharmacy.  She had recommended trying to wean medicines like gabapentin, diazepam, tizanidine, oxycodone.  Patient was also suffering from weight loss 20 pounds over 3 months-since last visit this thankfully has stabilized.  At last visit appetite was improving which was encouraging. Got as low as 158 at home but has improved since coming off sinemet. Patient was also started on Sinemet for mild Parkinson's features. Caused abdominal discomfort and had to be stopped.   We saw patient a month ago-we stopped diazepam.  Plan at follow-up was to consider reducing gabapentin from 400 mg twice daily to 300 mg twice daily  Patient and wife have talked and want to stop biotin, vitamin d, b12 Lab Results  Component Value Date   VITAMINB12 1,048 (H) 06/22/2018   Last vitamin D Lab Results  Component Value Date   VD25OH 81.58 10/30/2018  A/P:  Chronic pain and fatigue and unresponsive episodes as well as low appetite issues noted. Thankfully chronic pain has not worsened with stopping diazepam -Our next step to reduce polypharmacy is to reduce gabapentin from 400 mg twice daily to 300 mg twice daily (has at least 2 weeks of medication at home) for 2 weeks then step down to 200 mg twice daily for 2 weeks and then 100 mg twice daily for 2 weeks and then stop -If he does well with this we will also considering stopping tizanidine -He does not think he can tolerate being without the oxycodone -Wife also asks about stopping B12, vitamin D, biotin. Prior vitamin levels reviewed and I think is reasonable  to stop these -Thankful unintentional weight loss is stabilized-we will recheck in 2 months follow-up  #hypertension S: medication: Metoprolol 1 in the morning and half in the evening as well as Lasix 20 mg daily as well as amlodipine 10 mg Home readings #s: still a lot of variability- has seen some high #s at home as well as lower #s BP Readings from Last 3 Encounters:  01/02/20 134/72  12/19/19 133/73  12/13/19 95/61  A/P:  Stable though there is some variability with highs and lows still I think this is a reasonable range. Continue current medications.    #hyperlipidemia S: Medication: Atorvastatin 20 mg daily and rosuvastaitn 46m - unfortunately stil taking atorvastatin 240mLab Results  Component Value Date   CHOL 150 07/08/2019   HDL 37.50 (L) 07/08/2019   LDLCALC 81 07/08/2019   LDLDIRECT 134.9 12/26/2012   TRIG 158.0 (H) 07/08/2019   CHOLHDL 4 07/08/2019   A/P: Discontinue atorvastatin 20 mg.  Continue rosuvastatin 40 mg.  Suspect improved control  #We reviewed multiple reports including MRI brain, EEG, virtual colonoscopy. Thrilled virtual colonoscopy was reassuring -Patient is Still getting pain behind right eye-recommended ophthalmology follow-up and consideration of MRI orbits as per brain MRI. ESR was not elevated in  the past-doubt temporal arteritis. Follows about around eye care he reports -We will update carotid duplex-history of endarterectomy -Patient plans to follow-up with vascular for aneurysm history  #With prior episodes of unresponsiveness I do think it would be ideal for wife to be at home-majority of the time and we wrote the following letter "Kevin Andreonis a primary care patient of mine and husband of Kevin KantPatient's husband has multiple medical conditions including Parkinson's disease, coronary artery disease, history of stroke, as well as periods of unresponsiveness that we are investigating at present. It would be extremely helpful if accommodations would be made for Kevin Hodappo be able to work from home to help care for him as needed.   Thanks for your consideration, StGarret ReddishMD"    Recommended follow up: 2-25-monthllow-up Future Appointments  Date Time Provider [place]/04/2020 10:40 AM ThoBayard HuggerP [facility] CPR  01/17/2020 10:40 AM RosFay RecordsD [facility]  02/04/2020 10:45 AM SmiLyndal PulleyO [facility] None  03/09/2020  3:40 PM HunMarin OlpD [facility] PEC  03/26/2020  3:30 PM Tat, RebEustace QuailO LBN-[facility] None    Lab/Order associations:   ICD-10-CM   1. Chronic pain syndrome  G89.4   2. Essential hypertension  I10   3. History of carotid stenosis  Z86.79 VAS [place]  4. Parkinson's disease ([place]   5. Bilateral carotid artery stenosis  I65.23     Meds ordered this encounter  Medications  . gabapentin (NEURONTIN) 100 MG capsule    Sig: Two tabs twice a day    Dispense:  360 capsule    Refill:  3   Time Spent: 45 minutes of total time (9:20 AM- 10:05 AM) was spent on the date of the encounter performing the following actions: chart review prior to seeing the patient, obtaining history, performing a medically necessary exam, counseling on the treatment plan, placing orders, and documenting in our EHR.    Return precautions advised.  SteGarret ReddishD

## 2020-01-06 DIAGNOSIS — H26493 Other secondary cataract, bilateral: Secondary | ICD-10-CM | POA: Diagnosis not present

## 2020-01-09 ENCOUNTER — Other Ambulatory Visit: Payer: Self-pay | Admitting: Family Medicine

## 2020-01-09 ENCOUNTER — Other Ambulatory Visit: Payer: Self-pay

## 2020-01-09 ENCOUNTER — Encounter: Payer: Medicare HMO | Attending: Physical Medicine & Rehabilitation | Admitting: Registered Nurse

## 2020-01-09 ENCOUNTER — Encounter: Payer: Self-pay | Admitting: Registered Nurse

## 2020-01-09 VITALS — BP 135/66 | HR 73 | Temp 98.5°F | Ht 65.0 in | Wt 163.2 lb

## 2020-01-09 DIAGNOSIS — M7061 Trochanteric bursitis, right hip: Secondary | ICD-10-CM | POA: Diagnosis not present

## 2020-01-09 DIAGNOSIS — M25551 Pain in right hip: Secondary | ICD-10-CM | POA: Insufficient documentation

## 2020-01-09 DIAGNOSIS — M1712 Unilateral primary osteoarthritis, left knee: Secondary | ICD-10-CM | POA: Diagnosis not present

## 2020-01-09 DIAGNOSIS — Z5181 Encounter for therapeutic drug level monitoring: Secondary | ICD-10-CM | POA: Insufficient documentation

## 2020-01-09 DIAGNOSIS — I739 Peripheral vascular disease, unspecified: Secondary | ICD-10-CM | POA: Insufficient documentation

## 2020-01-09 DIAGNOSIS — Z79891 Long term (current) use of opiate analgesic: Secondary | ICD-10-CM | POA: Diagnosis not present

## 2020-01-09 DIAGNOSIS — M7062 Trochanteric bursitis, left hip: Secondary | ICD-10-CM

## 2020-01-09 DIAGNOSIS — M47816 Spondylosis without myelopathy or radiculopathy, lumbar region: Secondary | ICD-10-CM | POA: Insufficient documentation

## 2020-01-09 DIAGNOSIS — G894 Chronic pain syndrome: Secondary | ICD-10-CM | POA: Diagnosis not present

## 2020-01-09 MED ORDER — OXYCODONE-ACETAMINOPHEN 7.5-325 MG PO TABS: 1.0000 | ORAL_TABLET | Freq: Three times a day (TID) | ORAL | 0 refills | Status: DC | PRN

## 2020-01-09 NOTE — Progress Notes (Signed)
Subjective:    Patient ID: Kevin [name], male    DOB: 03/31/1941, 79 y.o.   MRN: [id number]  HPI: Kevin [name] is a 79 y.o. male who returns for follow up appointment for chronic pain and medication refill. He states his pain is located in his bilateral hips. He rates his pain 5. His current exercise regime is walking.  Mr. Sweda Morphine equivalent is 33.33 MME.    Last Oral Swab was Performed on 09/12/2019, it was consistent.   Pain Inventory Average Pain 8 Pain Right Now 5 My pain is intermittent, sharp and aching  In the last 24 hours, has pain interfered with the following? General activity 9 Relation with others 9 Enjoyment of life 9 What TIME of day is your pain at its worst? Morning, daytime, evening & night. Sleep (in general) Fair  Pain is worse with: walking, bending and standing Pain improves with: heat/ice and medication Relief from Meds: good  Mobility use a cane use a walker how many minutes can you walk? maybe 10 ability to climb steps?  yes do you drive?  no  Function retired I need assistance with the following:  Family takes care of patient. Do you have any goals in this area?  yes  Neuro/Psych tremor tingling trouble walking dizziness confusion anxiety  Prior Studies Any changes since last visit?  yes CT/MRI  Physicians involved in your care Any changes since last visit?  no   Family History  Problem Relation Age of Onset  . Parkinsonism Father   . Dementia Father   . Cancer Father        Throat  . Colon cancer Mother        died in 1s  . Cancer Mother        Colon   Social History   Socioeconomic History  . Marital status: Married    Spouse name: Not on file  . Number of children: Not on file  . Years of education: Not on file  . Highest education level: Not on file  Occupational History  . Occupation: retired    Comment: maintenance; electrician  Tobacco Use  . Smoking status: Former Smoker    Packs/day: 2.00     Years: 28.00    Pack years: 56.00    Types: Cigarettes    Quit date: 05/31/1987    Years since quitting: 32.6  . Smokeless tobacco: Never Used  . Tobacco comment: quit smoking "in my 40's"  Vaping Use  . Vaping Use: Never used  Substance and Sexual Activity  . Alcohol use: Not Currently    Alcohol/week: 0.0 standard drinks    Comment: rare, mikes hard lemonade  . Drug use: Not Currently    Types: Marijuana    Comment: hx of-4-5 yrs ago  . Sexual activity: Not Currently  Other Topics Concern  . Not on file  Social History Narrative   Married 33 years in 2015. Lived together for 12 years. 2 boys from first wife. 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       Hobbies: yardwork, [place] barn, woodwork, collect knive   Right handed   One story home   Caffeine yes   Social Determinants of Health   Financial Resource Strain:   . Difficulty of Paying Living Expenses:   Food Insecurity:   .  Worried About Charity fundraiser in the Last Year:   . Arboriculturist in the Last Year:   Transportation Needs:   . Film/video editor (Medical):   Marland Kitchen Lack of Transportation (Non-Medical):   Physical Activity:   . Days of Exercise per Week:   . Minutes of Exercise per Session:   Stress:   . Feeling of Stress :   Social Connections:   . Frequency of Communication with Friends and Family:   . Frequency of Social Gatherings with Friends and Family:   . Attends Religious Services:   . Active Member of Clubs or Organizations:   . Attends Archivist Meetings:   Marland Kitchen Marital Status:    Past Surgical History:  Procedure Laterality Date  . ABDOMINAL AORTIC ANEURYSM REPAIR  2010  . CAROTID ENDARTERECTOMY Left 09/20/2004  . CATARACT EXTRACTION, BILATERAL     2016-2017  . COLONOSCOPY    . CORONARY ARTERY BYPASS GRAFT N/A 06/27/2018   Procedure: CORONARY ARTERY BYPASS  GRAFTING (CABG), ON PUMP, TIMES FOUR, USING LEFT INTERNAL MAMMARY ARTERY AND ENDOSCOPICALLY HARVESTED LEFT GREATER SAPHENOUS VEIN;  Surgeon: Melrose Nakayama, MD;  Location: [facility];  Service: Open Heart Surgery;  Laterality: N/A;  . ENDARTERECTOMY Right 10/12/2015   Procedure: RIGHT CAROTID ENDARTERECTOMY WITH PATCH ANGIOPLASTY;  Surgeon: Elam Dutch, MD;  Location: [facility];  Service: Vascular;  Laterality: Right;  . INGUINAL HERNIA REPAIR Left 02/05/2013   Procedure: HERNIA REPAIR INGUINAL ADULT;  Surgeon: Joyice Faster. Cornett, MD;  Location: [facility];  Service: General;  Laterality: Left;  . INGUINAL HERNIA REPAIR Left   . INSERTION OF MESH Left 02/05/2013   Procedure: INSERTION OF MESH;  Surgeon: Joyice Faster. Cornett, MD;  Location: [facility];  Service: General;  Laterality: Left;  . LEFT HEART CATH AND CORONARY ANGIOGRAPHY N/A 06/21/2018   Procedure: LEFT HEART CATH AND CORONARY ANGIOGRAPHY;  Surgeon: Jettie Booze, MD;  Location: [facility];  Service: Cardiovascular;  Laterality: N/A;  . SHOULDER ARTHROSCOPY WITH ROTATOR CUFF REPAIR AND SUBACROMIAL DECOMPRESSION Left 05/01/2014   Procedure: LEFT ARTHROSCOPY SHOULDER SUBACROMIAL DECOMPRESSION,DISTAL CLAVICAL RESECTION AND ROTATOR CUFF REPAIR;  Surgeon: Marin Shutter, MD;  Location: [facility];  Service: Orthopedics;  Laterality: Left;  . TEE WITHOUT CARDIOVERSION N/A 06/27/2018   Procedure: TRANSESOPHAGEAL ECHOCARDIOGRAM (TEE);  Surgeon: Melrose Nakayama, MD;  Location: [facility];  Service: Open Heart Surgery;  Laterality: N/A;  . TESTICLAR CYST EXCISION Left   . TONSILLECTOMY    . VASECTOMY     Past Medical History:  Diagnosis Date  . Arthritis    DDD- Lower Back  . Bradycardia   . CAP (community acquired pneumonia) 03/17/2015   "THAT'S WHAT THEY ARE THINKING; THEY ARE NOT SURE"  . Carotid artery occlusion   . Chest pain 06/19/2018  . Chronic lower back pain    DDD  . CKD (chronic kidney  disease)    Dr. Corliss Parish  . 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 Diverticulosis   . Enlarged prostate    takes Flomax daily  . History of blood transfusion    "probably when they did aortic aneurysm"  . History of colon polyps    benign  . Hyperlipidemia   . Hypertension  takes Amlodipine and Zebeta daily  . Insomnia    takes Melatonin nightly  . Joint pain   . 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     . Muscle spasm    takes Zanaflex daily as needed  . Peripheral edema    takes Furosemide daily  . Pneumonia    hx of   . Rheumatic fever 1946  . Rheumatoid arthritis ([place])   . Short-term memory loss    minimal  . Shortness of breath dyspnea    with exertion but lasts very short period of time  . TIA (transient ischemic attack)    x 2;takes Plavix daily  . Urinary frequency    takes Flomax daily  . Urinary urgency    BP 135/66   Pulse 73   Temp 98.5 F (36.9 C)   Ht 5\' 5"  (1.651 m)   Wt 163 lb 3.2 oz (74 kg)   SpO2 96%   BMI 27.16 kg/m   Opioid Risk Score:   Fall Risk Score:  `1  Depression screen PHQ 2/9  Depression screen [facility] 2/9 12/13/2019 11/14/2019 05/09/2019 04/16/2019 04/16/2019 08/09/2018 04/16/2018  Decreased Interest 0 0 1 0 0 0 0  Down, Depressed, Hopeless 0 0 1 0 0 0 0  PHQ - 2 Score 0 0 2 0 0 0 0  Altered sleeping - - 0 - - - -  Tired, decreased energy - - 3 - - - -  Change in appetite - - 1 - - - -  Feeling bad or failure about yourself  - - 1 - - - -  Trouble concentrating - - 1 - - - -  Moving slowly or fidgety/restless - - 0 - - - -  PHQ-9 Score - - 8 - - - -  Some recent data might be hidden   Review of Systems  Musculoskeletal: Positive for arthralgias, back pain and gait problem.  All other systems reviewed and are negative.        Objective:   Physical Exam Vitals and nursing note reviewed.  Constitutional:      Appearance: Normal appearance.  Cardiovascular:     Rate and Rhythm: Normal rate and regular rhythm.     Pulses: Normal pulses.     Heart sounds: Normal heart sounds.  Pulmonary:     Effort: Pulmonary effort is normal.     Breath sounds: Normal breath sounds.  Musculoskeletal:     Cervical back: Normal range of motion and neck supple.     Comments: Normal Muscle Bulk and Muscle Testing Reveals:  Upper Extremities: Full ROM and Muscle Strength 5/5  Lumbar Paraspinal Tenderness: L-4-L-5 Lower Extremities: Full ROM and Muscle Strength 5/5 Narrow Based Gait   Skin:    General: Skin is warm and dry.  Neurological:     Mental Status: He is alert and oriented to person, place, and time.  Psychiatric:        Mood and Affect: Mood normal.        Behavior: Behavior normal.           Assessment & Plan:  1. Lumbar Spondylosis/ Lumbar Stenosis:01/09/2020 Continue current medication regime. Refilled:Oxycodone 7.5/325 mg one tabletevery 8 hoursas needed #80.01/09/2020 We will continue the opioid monitoring program, this consists of regular clinic visits, examinations, urine drug screen, pill counts as well as use of [place] Controlled Substance Reporting System. 2.Bilateral Knees OA/ChronicBilateralKnee Pain:No complaints todat. S/PBilateral KneeInjection by Dr Tamala Julian  on 12/09/2019,  Orthopedist Following 01/09/2020. 3.BilateralGreater Trochanter Bursitis:Orthopedist Following.Continue to alternate Ice and Heat Therapy.01/09/2020  F/U in 1 month  20 minutes of face to face patient care time was spent during this visit. All questions were encouraged and answered.

## 2020-01-14 ENCOUNTER — Telehealth: Payer: Self-pay

## 2020-01-14 NOTE — Telephone Encounter (Signed)
Please advise 

## 2020-01-14 NOTE — Telephone Encounter (Signed)
Pt is having head congestion and wants to know if he can take an OTC, like claritin or Zyrtec for relief, with the prescription meds he is on

## 2020-01-14 NOTE — Telephone Encounter (Signed)
Called patient will try Claritin and let our office know if any issues.

## 2020-01-14 NOTE — Telephone Encounter (Signed)
He can do a plain Claritin or Zyrtec-he should not take a decongestant.  He may want to start with just half a dose at nighttime as it can cause grogginess in some patients.  Flonase over-the-counter nasal spray would be lower risk if he can tolerate that/Is not already taking

## 2020-01-17 ENCOUNTER — Ambulatory Visit: Payer: Medicare HMO | Admitting: Internal Medicine

## 2020-01-17 ENCOUNTER — Encounter: Payer: Self-pay | Admitting: Internal Medicine

## 2020-01-17 ENCOUNTER — Other Ambulatory Visit: Payer: Self-pay

## 2020-01-17 VITALS — BP 122/54 | HR 69 | Ht 67.0 in | Wt 165.4 lb

## 2020-01-17 DIAGNOSIS — I2510 Atherosclerotic heart disease of native coronary artery without angina pectoris: Secondary | ICD-10-CM

## 2020-01-17 NOTE — Patient Instructions (Signed)
Medication Instructions:  NO CHANGES *If you need a refill on your cardiac medications before your next appointment, please call your pharmacy*   Lab Work: NONE If you have labs (blood work) drawn today and your tests are completely normal, you will receive your results only by: Marland Kitchen MyChart Message (if you have MyChart) OR . A paper copy in the mail If you have any lab test that is abnormal or we need to change your treatment, we will call you to review the results.   Testing/Procedures: NONE   Follow-Up: At [facility], you and your health needs are our priority.  As part of our continuing mission to provide you with exceptional heart care, we have created designated Provider Care Teams.  These Care Teams include your primary Cardiologist (physician) and Advanced Practice Providers (APPs -  Physician Assistants and Nurse Practitioners) who all work together to provide you with the care you need, when you need it.  Your next appointment:   9 month(s)  The format for your next appointment:   In Person  Provider:   You may see Dorris Carnes, MD or one of the following Advanced Practice Providers on your designated Care Team:    Richardson Dopp, PA-C  Robbie Lis, [place]    Other Instructions

## 2020-01-17 NOTE — Progress Notes (Addendum)
Cardiology Office Note   Date:  01/17/2020   ID:  [name], [name] 1941/04/18, MRN [id number]  PCP:  Marin Olp, MD  Cardiologist:   Dorris Carnes, MD   Pt seen for f/u of CAD    History of Present Illness: Kevin [name] is a 79 y.o. male with a history ofCAD (s/p CABG 2020), hypertension, hyperlipidemia, bradycardia, carotid artery disease s/p bilateral CEA, TIA x2 previously on Plavix, RA, CKD stage III.  The patient was admitted to the hospital in 05/2018 with chest pain and found to have multivessel CAD.  He underwent CABG x4 (LIMA to LAD; SVG to D1, SVG to OM2; SVG to PDA) on 06/27/2018. EF was 60-65% by echo. He had postop atrial fibrillation and was placed on amiodarone and anticoagulation which have both been discontinued since then.  I saw the pt in Sept as a televisit   He was then seen by Buren Kos in clinic in November  At the time he had some confusion, felt weak    HR noted to be irregular    He wa set up for an event monitor    The was done in April 2021  No arrhythmia detected  Since seen by Buren Kos he remains weak.  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 albuterol (VENTOLIN HFA) 108 (90 Base) MCG/ACT inhaler Inhale 2 puffs into the lungs every 12 (twelve) hours as needed for wheezing or shortness of breath.  Marland Kitchen amLODipine (NORVASC) 10 MG tablet TAKE 1 TABLET EVERY DAY  . aspirin EC 81 MG EC tablet Take 1 tablet (81 mg total) by mouth daily.  . cilostazol (PLETAL) 50 MG tablet Take 1 tablet by mouth twice daily  . fenofibrate 160 MG tablet Take 1 tablet (160 mg total) by mouth daily.  . furosemide (LASIX) 20 MG tablet TAKE 1 TABLET EVERY DAY  .  gabapentin (NEURONTIN) 100 MG capsule Two tabs twice a day (Patient taking differently: Take 300 mg by mouth 2 (two) times daily. Two tabs twice a day)  . gabapentin (NEURONTIN) 400 MG capsule Take 1 capsule (400 mg total) by mouth 2 (two) times daily. 400 mg twice a day  . linaclotide (LINZESS) 145 MCG CAPS capsule Take 145 mcg by mouth as needed (for stomach).   . Melatonin 10 MG TABS Take 10 mg by mouth at bedtime.   . metoprolol tartrate (LOPRESSOR) 25 MG tablet TAKE 1 TABLET TWICE DAILY  . oxyCODONE-acetaminophen (PERCOCET) 7.5-325 MG tablet Take 1 tablet by mouth every 8 (eight) hours as needed for severe pain.  . rosuvastatin (CRESTOR) 40 MG tablet Take 1 tablet (40 mg total) by mouth daily.  . tamsulosin (FLOMAX) 0.4 MG CAPS capsule Take 1 capsule (0.4 mg total) by mouth daily.  Marland Kitchen tiZANidine (ZANAFLEX) 4 MG tablet TAKE 1 TABLET EVERY 6 HOURS AS NEEDED FOR MUSCLE SPASMS     Allergies:   Hydralazine, Mirtazapine, and Sinemet [carbidopa-levodopa]   Past Medical History:  Diagnosis Date  . Arthritis  DDD- Lower Back  . Bradycardia   . CAP (community acquired pneumonia) 03/17/2015   "THAT'S WHAT THEY ARE THINKING; THEY ARE NOT SURE"  . Carotid artery occlusion   . Chest pain 06/19/2018  . Chronic lower back pain    DDD  . CKD (chronic kidney disease)    Dr. Corliss Parish  . 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 Diverticulosis   . Enlarged prostate    takes Flomax daily  . History of blood transfusion    "probably when they did aortic aneurysm"  . History of colon polyps    benign  . Hyperlipidemia   . Hypertension    takes Amlodipine and Zebeta daily  . Insomnia    takes Melatonin nightly  . Joint pain   . 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     . Muscle spasm    takes  Zanaflex daily as needed  . Peripheral edema    takes Furosemide daily  . Pneumonia    hx of   . Rheumatic fever 1946  . Rheumatoid arthritis ([place])   . Short-term memory loss    minimal  . Shortness of breath dyspnea    with exertion but lasts very short period of time  . TIA (transient ischemic attack)    x 2;takes Plavix daily  . Urinary frequency    takes Flomax daily  . Urinary urgency     Past Surgical History:  Procedure Laterality Date  . ABDOMINAL AORTIC ANEURYSM REPAIR  2010  . CAROTID ENDARTERECTOMY Left 09/20/2004  . CATARACT EXTRACTION, BILATERAL     2016-2017  . COLONOSCOPY    . CORONARY ARTERY BYPASS GRAFT N/A 06/27/2018   Procedure: CORONARY ARTERY BYPASS GRAFTING (CABG), ON PUMP, TIMES FOUR, USING LEFT INTERNAL MAMMARY ARTERY AND ENDOSCOPICALLY HARVESTED LEFT GREATER SAPHENOUS VEIN;  Surgeon: Melrose Nakayama, MD;  Location: [facility];  Service: Open Heart Surgery;  Laterality: N/A;  . ENDARTERECTOMY Right 10/12/2015   Procedure: RIGHT CAROTID ENDARTERECTOMY WITH PATCH ANGIOPLASTY;  Surgeon: Elam Dutch, MD;  Location: [facility];  Service: Vascular;  Laterality: Right;  . INGUINAL HERNIA REPAIR Left 02/05/2013   Procedure: HERNIA REPAIR INGUINAL ADULT;  Surgeon: Joyice Faster. Cornett, MD;  Location: [facility];  Service: General;  Laterality: Left;  . INGUINAL HERNIA REPAIR Left   . INSERTION OF MESH Left 02/05/2013   Procedure: INSERTION OF MESH;  Surgeon: Joyice Faster. Cornett, MD;  Location: [facility];  Service: General;  Laterality: Left;  . LEFT HEART CATH AND CORONARY ANGIOGRAPHY N/A 06/21/2018   Procedure: LEFT HEART CATH AND CORONARY ANGIOGRAPHY;  Surgeon: Jettie Booze, MD;  Location: [facility];  Service: Cardiovascular;  Laterality: N/A;  . SHOULDER ARTHROSCOPY WITH ROTATOR CUFF REPAIR AND SUBACROMIAL DECOMPRESSION Left 05/01/2014   Procedure: LEFT ARTHROSCOPY SHOULDER SUBACROMIAL DECOMPRESSION,DISTAL CLAVICAL RESECTION  AND ROTATOR CUFF REPAIR;  Surgeon: Marin Shutter, MD;  Location: [facility];  Service: Orthopedics;  Laterality: Left;  . TEE WITHOUT CARDIOVERSION N/A 06/27/2018   Procedure: TRANSESOPHAGEAL ECHOCARDIOGRAM (TEE);  Surgeon: Melrose Nakayama, MD;  Location: [facility];  Service: Open Heart Surgery;  Laterality: N/A;  . TESTICLAR CYST EXCISION Left   . TONSILLECTOMY    . VASECTOMY       Social History:  The patient  reports that he quit smoking about 32 years ago. His smoking use included cigarettes. He has a 56.00 pack-year smoking history. He has never used smokeless tobacco. He reports previous alcohol use. He reports previous drug use. Drug: Marijuana.   Family History:  The patient's family history includes Cancer in his father and mother; Colon cancer in his mother; Dementia in his father; Parkinsonism in his father.    ROS:  Please see the history of present illness. All other systems are reviewed and  Negative to the above problem except as noted.    PHYSICAL EXAM: VS:  BP (!) 122/54   Pulse 69   Ht 5\' 7"  (1.702 m)   Wt 165 lb 6.4 oz (75 kg)   BMI 25.91 kg/m   GEN: Well nourished, well developed, in no acute distress  HEENT: normal  Neck: JVP is not elevated   Cardiac: RRR; no murmurs,  Trivial LE edema  Respiratory:  clear to auscultation bilaterally, No wheezes GI: soft, nontender, nondistended,   No hepatomegaly  MS:  Moving all extremities   Skin: warm and dry, no rash  EKG:  SR 69 bpm    Lipid Panel    Component Value Date/Time   CHOL 150 07/08/2019 1442   TRIG 158.0 (H) 07/08/2019 1442   HDL 37.50 (L) 07/08/2019 1442   CHOLHDL 4 07/08/2019 1442   VLDL 31.6 07/08/2019 1442   LDLCALC 81 07/08/2019 1442   LDLDIRECT 134.9 12/26/2012 1201      Wt Readings from Last 3 Encounters:  01/17/20 165 lb 6.4 oz (75 kg)  01/09/20 163 lb 3.2 oz (74 kg)  01/02/20 164 lb (74.4 kg)      ASSESSMENT AND PLAN:  1  CAD   Pt is s/p CABG in 2020   Has remaned fatigued since,   Denies CP   2 Afib  Only after CABG   No documented recurrence  Keep on ASA  3  HTN  BP is controlled on recent checks  Keep on current regimen  4  PAD   Follow up CV scan and appt with C Fields   5  Anemia  CBC was OK in Feb 2021    6  Deconditioning Follows with Z Smith    7   HL LDL in Feb 2021 was 81, HDL 37  Crestor started    F/U next spring  [place] if symptoms change  He has many other appts       Current medicines are reviewed at length with the patient today.  The patient does not have concerns regarding medicines.  Signed, Dorris Carnes, MD  01/17/2020 11:25 AM    [address], [place], [place]  [address] Phone: [phone]; Fax: [phone]

## 2020-01-22 ENCOUNTER — Other Ambulatory Visit: Payer: Self-pay

## 2020-01-22 ENCOUNTER — Other Ambulatory Visit: Payer: Self-pay | Admitting: Family Medicine

## 2020-01-22 ENCOUNTER — Ambulatory Visit (HOSPITAL_COMMUNITY)
Admission: RE | Admit: 2020-01-22 | Payer: Medicare HMO | Source: Ambulatory Visit | Attending: Family Medicine | Admitting: Family Medicine

## 2020-01-26 ENCOUNTER — Other Ambulatory Visit: Payer: Self-pay | Admitting: Family Medicine

## 2020-01-28 IMAGING — CT CT ABDOMEN AND PELVIS WITHOUT CONTRAST
2 of 4 series · 16 of 46 positions shown, 18 images · non-contrast
Comparison: 08/25/2017

CLINICAL DATA: Left lower quadrant pain

EXAM:
CT ABDOMEN AND PELVIS WITHOUT CONTRAST
TECHNIQUE: Multidetector CT imaging of the abdomen and pelvis was performed
following the standard protocol without IV contrast. Oral enteric
contrast was administered.

[Series 2: abd/ pelvis · axial · 0.79mm/px · z∈[+934,+1374]mm · 13 of 98 slices shown, 15 images]
[im 5/98  soft-tissue]
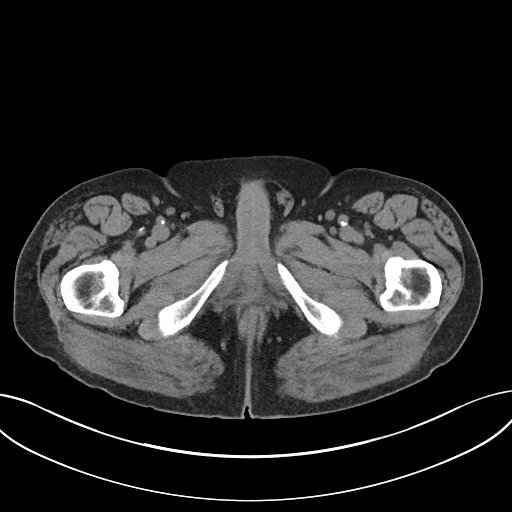
[im 5/98  bone]
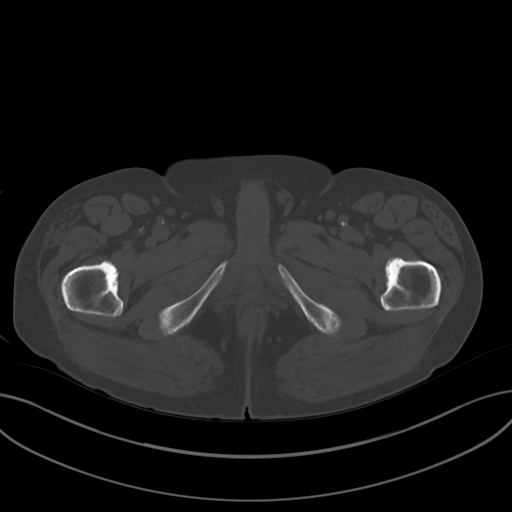
[im 13/98  soft-tissue]
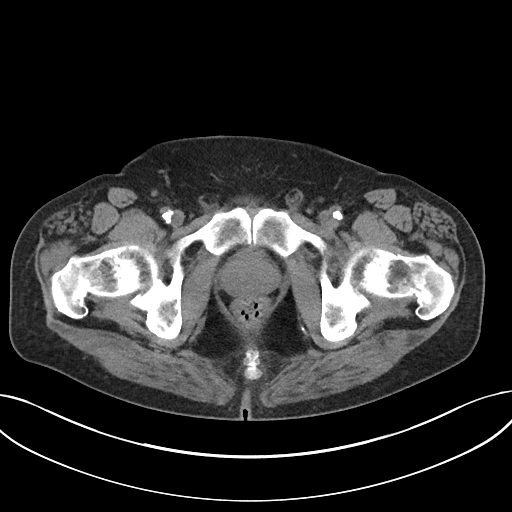
[im 21/98  soft-tissue]
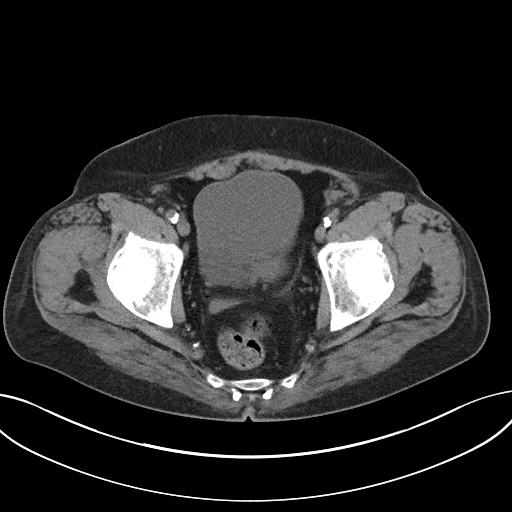
[im 29/98  soft-tissue]
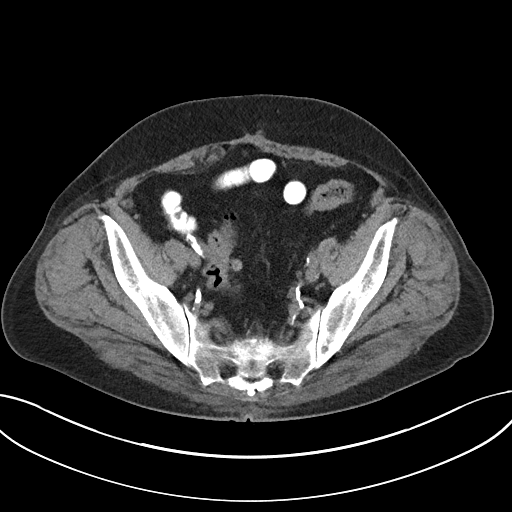
[im 33/98  soft-tissue]
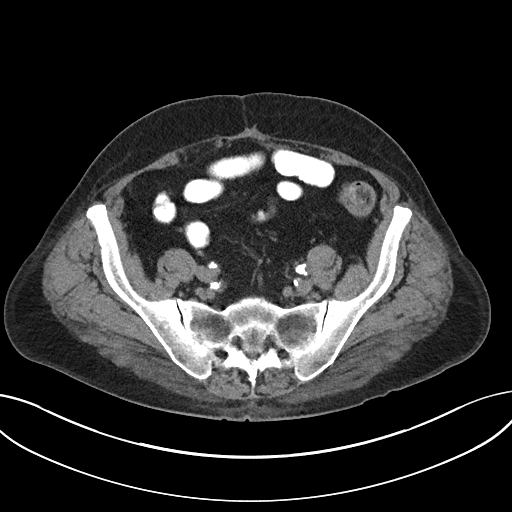
[im 41/98  soft-tissue]
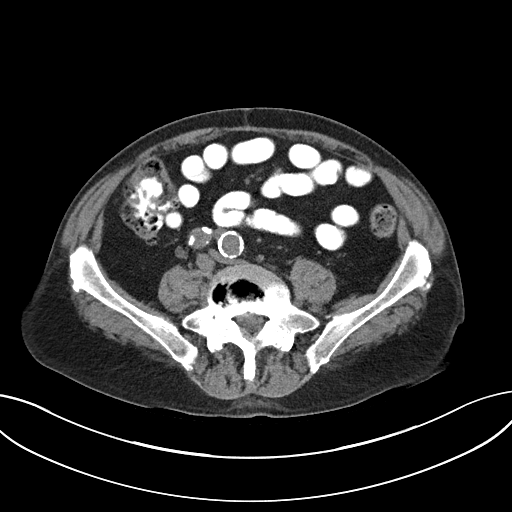
[im 49/98  soft-tissue]
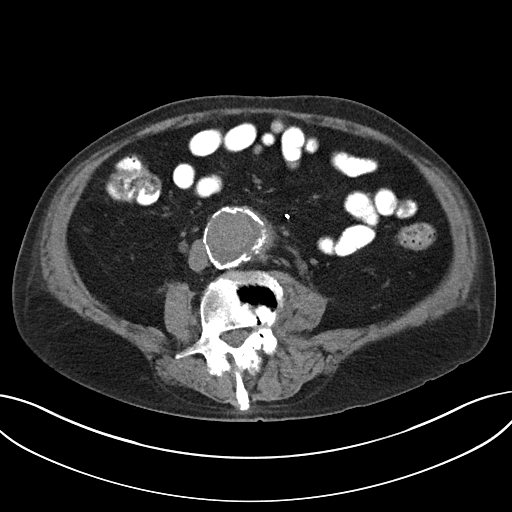
[im 57/98  soft-tissue]
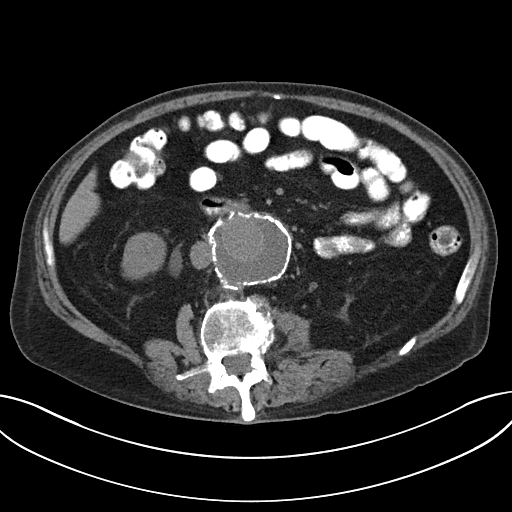
[im 65/98  soft-tissue]
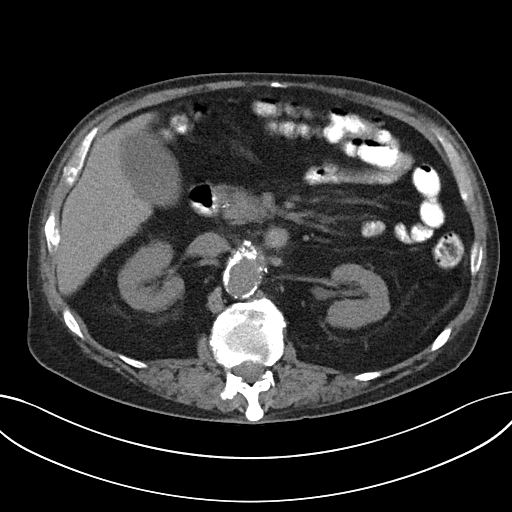
[im 65/98  bone]
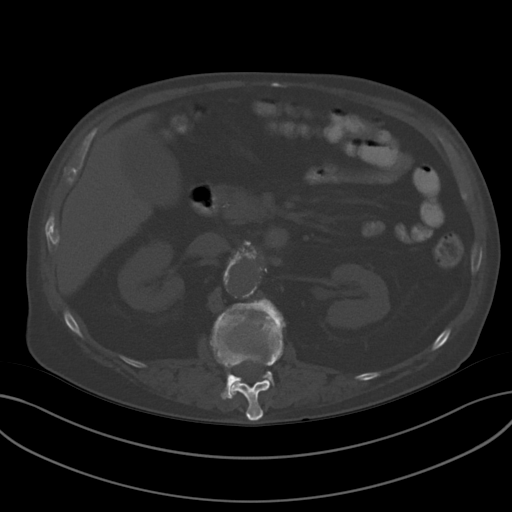
[im 69/98  soft-tissue]
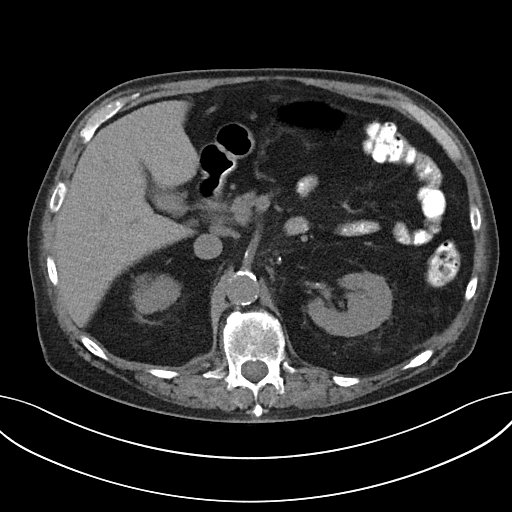
[im 77/98  soft-tissue]
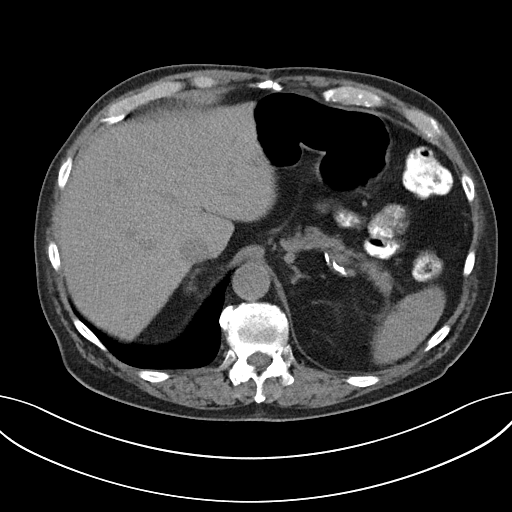
[im 85/98  soft-tissue]
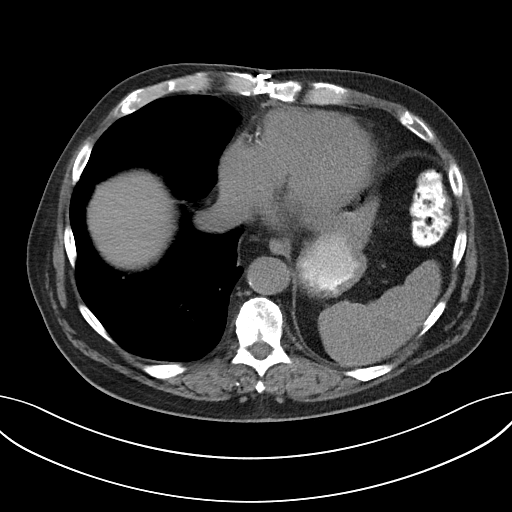
[im 93/98  soft-tissue]
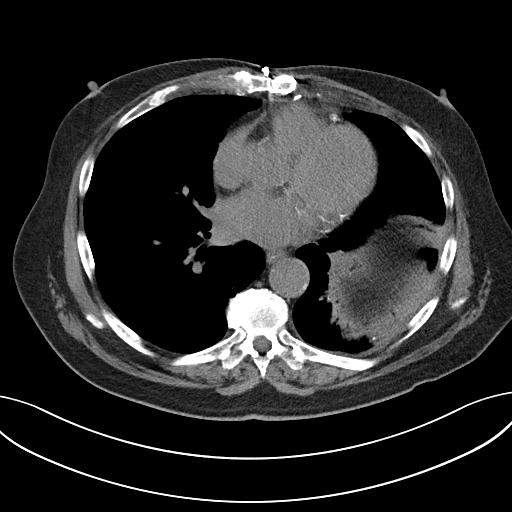

[Series 5: cor st · coronal · 0.82mm/px · 3 of 100 slices shown]
[im 34/100  soft-tissue]
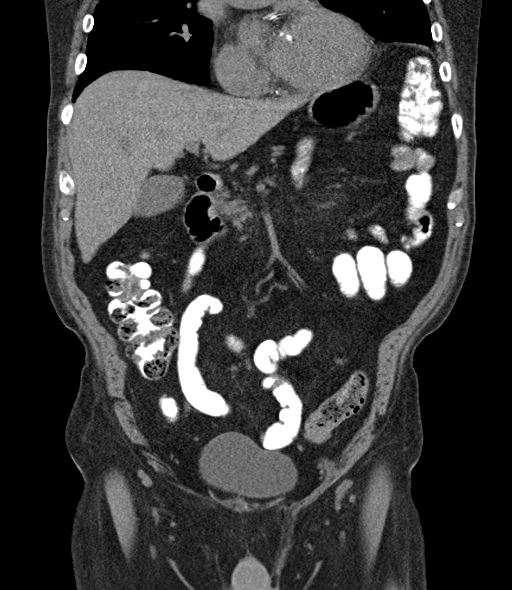
[im 45/100  soft-tissue]
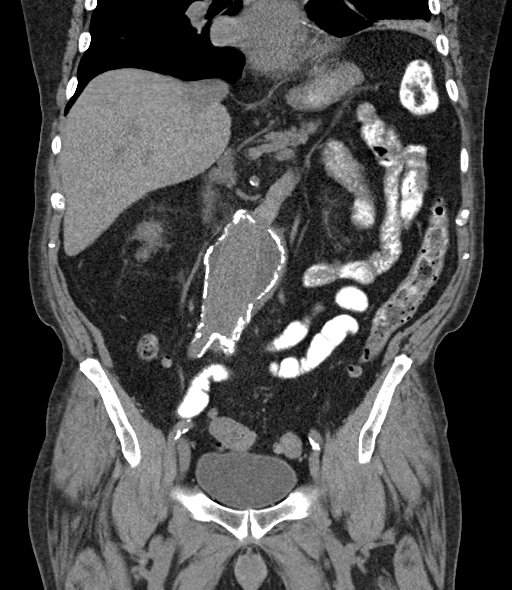
[im 56/100  soft-tissue]
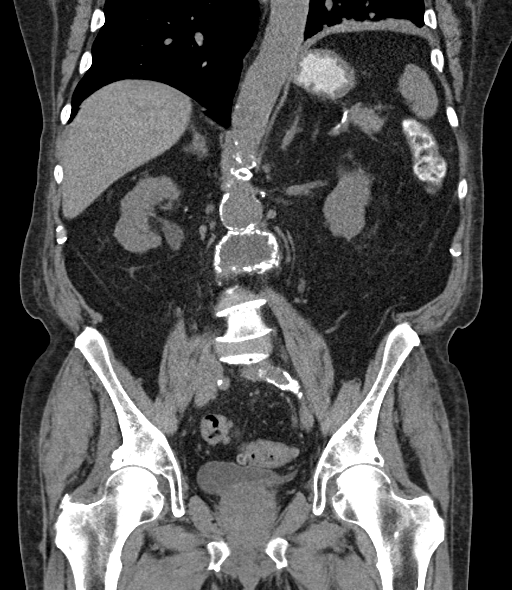

[16 of 46 positions shown; findings below may reference images not displayed]

FINDINGS: Lower chest: Left basilar scarring and/or atelectasis. Coronary
artery calcifications. Status post CABG.

Hepatobiliary: No focal liver abnormality is seen. Tiny gallstones
in the dependent gallbladder. No gallbladder wall thickening, or
biliary dilatation.

Pancreas: Unremarkable. No pancreatic ductal dilatation or
surrounding inflammatory changes.

Spleen: Normal in size without focal abnormality.

Adrenals/Urinary Tract: Adrenal glands are unremarkable. Kidneys are
normal, without renal calculi, focal lesion, or hydronephrosis.
Bladder is unremarkable.

Stomach/Bowel: Stomach is within normal limits. Appendix appears
normal. No evidence of bowel wall thickening, distention, or
inflammatory changes. Sigmoid diverticulosis.

Vascular/Lymphatic: No change in noncontrast post graft repair
appearance of a large aneurysm of the infrarenal abdominal aorta. No
enlarged abdominal or pelvic lymph nodes.

Reproductive: No mass or other abnormality.

Other: No abdominal wall hernia or abnormality. No abdominopelvic
ascites.

Musculoskeletal: No acute or significant osseous findings.
IMPRESSION: 1. No acute noncontrast CT findings of the abdomen or pelvis to
explain left lower quadrant pain. Sigmoid diverticulosis without
evidence of acute diverticulitis.

2. No change in noncontrast post graft repair appearance of a large
aneurysm of the infrarenal abdominal aorta.

3.  Cholelithiasis.

## 2020-01-29 ENCOUNTER — Other Ambulatory Visit: Payer: Self-pay | Admitting: Family Medicine

## 2020-02-03 NOTE — Progress Notes (Signed)
[facility] [address] [place] Phone: [phone] Subjective:   I Kevin [name] am serving as a Education administrator for Dr. Hulan Saas.  This visit occurred during the SARS-CoV-2 public health emergency.  Safety protocols were in place, including screening questions prior to the visit, additional usage of staff PPE, and extensive cleaning of exam room while observing appropriate contact time as indicated for disinfecting solutions.   I'm seeing this patient by the request  of:  Marin Olp, MD  CC:   [id number]   12/09/2019 Due to patient's chronic comorbidities chronic exacerbation of this chronic problem patient is injections are the safest thing for him at this time. Patient is on multiple different medications and is not a surgical candidate at this time either. Patient is on chronic pain medication by another provider as well. I do believe the underlying Parkinson's is also contributing. Unable to do oral anti-inflammatory secondary to social determinants of health and his chronic kidney disease as well as coronary artery disease. Patient can have these injections every 8 to 10 weeks if necessary. Has not responded well to viscosupplementation in the past but we can always repeat as well. Follow-up again in 8 to 10 weeks.  Update 02/04/2020 Kevin [name] is a 79 y.o. male coming in with complaint of bilateral knee pain. Patient states he would like bilateral hip injections today. Patient has some concerns with the left shoulder.  Patient states that the knees are worse than anything else.  Has known arthritic changes.  Patient is on chronic pain medications and goes to a pain medicine doctor.  Continuing to have back pain as well as bilateral hip pain but right greater than left.      Past Medical History:  Diagnosis Date  . Arthritis    DDD- Lower Back  . Bradycardia   . CAP (community acquired pneumonia) 03/17/2015   "THAT'S WHAT  THEY ARE THINKING; THEY ARE NOT SURE"  . Carotid artery occlusion   . Chest pain 06/19/2018  . Chronic lower back pain    DDD  . CKD (chronic kidney disease)    Dr. Corliss Parish  . 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 Diverticulosis   . Enlarged prostate    takes Flomax daily  . History of blood transfusion    "probably when they did aortic aneurysm"  . History of colon polyps    benign  . Hyperlipidemia   . Hypertension    takes Amlodipine and Zebeta daily  . Insomnia    takes Melatonin nightly  . Joint pain   . 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     . Muscle spasm    takes Zanaflex daily as needed  . Peripheral edema    takes Furosemide daily  . Pneumonia    hx of   . Rheumatic fever 1946  . Rheumatoid arthritis ([place])   . Short-term memory loss    minimal  . Shortness of breath dyspnea    with exertion but lasts very short period of time  . TIA (transient ischemic attack)    x 2;takes Plavix daily  . Urinary frequency    takes Flomax daily  . Urinary urgency    Past Surgical History:  Procedure Laterality Date  .  ABDOMINAL AORTIC ANEURYSM REPAIR  2010  . CAROTID ENDARTERECTOMY Left 09/20/2004  . CATARACT EXTRACTION, BILATERAL     2016-2017  . COLONOSCOPY    . CORONARY ARTERY BYPASS GRAFT N/A 06/27/2018   Procedure: CORONARY ARTERY BYPASS GRAFTING (CABG), ON PUMP, TIMES FOUR, USING LEFT INTERNAL MAMMARY ARTERY AND ENDOSCOPICALLY HARVESTED LEFT GREATER SAPHENOUS VEIN;  Surgeon: Melrose Nakayama, MD;  Location: [facility];  Service: Open Heart Surgery;  Laterality: N/A;  . ENDARTERECTOMY Right 10/12/2015   Procedure: RIGHT CAROTID ENDARTERECTOMY WITH PATCH ANGIOPLASTY;  Surgeon: Elam Dutch, MD;  Location: [facility];  Service: Vascular;  Laterality: Right;  . INGUINAL  HERNIA REPAIR Left 02/05/2013   Procedure: HERNIA REPAIR INGUINAL ADULT;  Surgeon: Joyice Faster. Cornett, MD;  Location: [facility];  Service: General;  Laterality: Left;  . INGUINAL HERNIA REPAIR Left   . INSERTION OF MESH Left 02/05/2013   Procedure: INSERTION OF MESH;  Surgeon: Joyice Faster. Cornett, MD;  Location: [facility];  Service: General;  Laterality: Left;  . LEFT HEART CATH AND CORONARY ANGIOGRAPHY N/A 06/21/2018   Procedure: LEFT HEART CATH AND CORONARY ANGIOGRAPHY;  Surgeon: Jettie Booze, MD;  Location: [facility];  Service: Cardiovascular;  Laterality: N/A;  . SHOULDER ARTHROSCOPY WITH ROTATOR CUFF REPAIR AND SUBACROMIAL DECOMPRESSION Left 05/01/2014   Procedure: LEFT ARTHROSCOPY SHOULDER SUBACROMIAL DECOMPRESSION,DISTAL CLAVICAL RESECTION AND ROTATOR CUFF REPAIR;  Surgeon: Marin Shutter, MD;  Location: [facility];  Service: Orthopedics;  Laterality: Left;  . TEE WITHOUT CARDIOVERSION N/A 06/27/2018   Procedure: TRANSESOPHAGEAL ECHOCARDIOGRAM (TEE);  Surgeon: Melrose Nakayama, MD;  Location: [facility];  Service: Open Heart Surgery;  Laterality: N/A;  . TESTICLAR CYST EXCISION Left   . TONSILLECTOMY    . VASECTOMY     Social History   Socioeconomic History  . Marital status: Married    Spouse name: Not on file  . Number of children: Not on file  . Years of education: Not on file  . Highest education level: Not on file  Occupational History  . Occupation: retired    Comment: maintenance; electrician  Tobacco Use  . Smoking status: Former Smoker    Packs/day: 2.00    Years: 28.00    Pack years: 56.00    Types: Cigarettes    Quit date: 05/31/1987    Years since quitting: 32.7  . Smokeless tobacco: Never Used  . Tobacco comment: quit smoking "in my 40's"  Vaping Use  . Vaping Use: Never used  Substance and Sexual Activity  . Alcohol use: Not Currently    Alcohol/week: 0.0 standard drinks    Comment: rare, mikes hard lemonade  . Drug use:  Not Currently    Types: Marijuana    Comment: hx of-4-5 yrs ago  . Sexual activity: Not Currently  Other Topics Concern  . Not on file  Social History Narrative   Married 33 years in 2015. Lived together for 12 years. 2 boys from first wife. 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       Hobbies: yardwork, [place] barn, woodwork, collect knive   Right handed   One story home   Caffeine yes   Social Determinants of Health   Financial Resource Strain:   . Difficulty of Paying Living Expenses: Not on file  Food Insecurity:   .  Worried About Charity fundraiser in the Last Year: Not on file  . Ran Out of Food in the Last Year: Not on file  Transportation Needs:   . Lack of Transportation (Medical): Not on file  . Lack of Transportation (Non-Medical): Not on file  Physical Activity:   . Days of Exercise per Week: Not on file  . Minutes of Exercise per Session: Not on file  Stress:   . Feeling of Stress : Not on file  Social Connections:   . Frequency of Communication with Friends and Family: Not on file  . Frequency of Social Gatherings with Friends and Family: Not on file  . Attends Religious Services: Not on file  . Active Member of Clubs or Organizations: Not on file  . Attends Archivist Meetings: Not on file  . Marital Status: Not on file   Allergies  Allergen Reactions  . Hydralazine Other (See Comments)    Pt stated he feels drunk  . Mirtazapine Other (See Comments)    Very tired, feeling drunk, loopy  . Sinemet [Carbidopa-Levodopa] Other (See Comments)    GI symptoms    Family History  Problem Relation Age of Onset  . Parkinsonism Father   . Dementia Father   . Cancer Father        Throat  . Colon cancer Mother        died in 45s  . Cancer Mother        Colon     Current Outpatient Medications (Cardiovascular):  .  amLODipine  (NORVASC) 10 MG tablet, TAKE 1 TABLET EVERY DAY .  fenofibrate 160 MG tablet, Take 1 tablet (160 mg total) by mouth daily. .  furosemide (LASIX) 20 MG tablet, TAKE 1 TABLET EVERY DAY .  metoprolol tartrate (LOPRESSOR) 25 MG tablet, TAKE 1 TABLET TWICE DAILY .  rosuvastatin (CRESTOR) 40 MG tablet, Take 1 tablet (40 mg total) by mouth daily.  Current Outpatient Medications (Respiratory):  .  albuterol (VENTOLIN HFA) 108 (90 Base) MCG/ACT inhaler, Inhale 2 puffs into the lungs every 12 (twelve) hours as needed for wheezing or shortness of breath.  Current Outpatient Medications (Analgesics):  .  aspirin EC 81 MG EC tablet, Take 1 tablet (81 mg total) by mouth daily. Marland Kitchen  oxyCODONE-acetaminophen (PERCOCET) 7.5-325 MG tablet, Take 1 tablet by mouth every 8 (eight) hours as needed for severe pain.  Current Outpatient Medications (Hematological):  .  cilostazol (PLETAL) 50 MG tablet, Take 1 tablet by mouth twice daily  Current Outpatient Medications (Other):  .  gabapentin (NEURONTIN) 100 MG capsule, Two tabs twice a day (Patient taking differently: Take 300 mg by mouth 2 (two) times daily. Two tabs twice a day) .  gabapentin (NEURONTIN) 400 MG capsule, Take 1 capsule (400 mg total) by mouth 2 (two) times daily. 400 mg twice a day .  linaclotide (LINZESS) 145 MCG CAPS capsule, Take 145 mcg by mouth as needed (for stomach).  .  Melatonin 10 MG TABS, Take 10 mg by mouth at bedtime.  .  tamsulosin (FLOMAX) 0.4 MG CAPS capsule, Take 1 capsule (0.4 mg total) by mouth daily. Marland Kitchen  tiZANidine (ZANAFLEX) 4 MG tablet, TAKE 1 TABLET EVERY 6 HOURS AS NEEDED FOR MUSCLE SPASM(S) .  gabapentin (NEURONTIN) 300 MG capsule, Take 1 capsule (300 mg total) by mouth at bedtime.   Reviewed prior external information including notes and imaging from  primary care provider As well as notes that were available from care everywhere and  other healthcare systems.  Past medical history, social, surgical and family history all  reviewed in electronic medical record.  No pertanent information unless stated regarding to the chief complaint.   Review of Systems:  No headache, visual changes, nausea, vomiting, diarrhea, constipation, dizziness, abdominal pain, skin rash, fevers, chills, night sweats, weight loss, swollen lymph nodes,  joint swelling, chest pain, shortness of breath, mood changes. POSITIVE muscle aches, body aches  Objective  Blood pressure 120/70, pulse 66, height 5\' 7"  (1.702 m), weight 163 lb (73.9 kg), SpO2 95 %.   General: Mild masked facies HEENT: Pupils equal, extraocular movements intact  Respiratory: Patient's speak in full sentences and does not appear short of breath  Gait severely antalgic MSK: Hypertonicity noted in multiple muscles and patient does have some mild cogwheeling noted.  Mild resting tremor noted of the left upper extremity. Knee: Bilateral valgus deformity noted.  Abnormal thigh to calf ratio.  Tender to palpation over medial and PF joint line.  Hypertonicity of the knees bilaterally does have some very mild limited range of motion in all planes instability with valgus force.  painful patellar compression. Patellar glide with moderate crepitus. Patellar and quadriceps tendons unremarkable. Hamstring and quadriceps strength is normal.   Hip exams do show tightness of the hips bilaterally.  No pain over the groin with internal range of motion, difficulty with Corky Sox with severe pain on the right greater trochanteric area.  Back exam diffuse tenderness to palpation noted with significant tightness with straight leg test.  No true radicular symptoms though. Does have some mild increase in pain in the back with extension greater than 10 degrees  Left shoulder exam does have a positive impingement noted with Neer and Hawkins.  Patient does have hypertonicity of the shoulder as well with decreasing range of internal rotation to lateral hip  After informed written and verbal  consent, patient was seated on exam table. Right knee was prepped with alcohol swab and utilizing anterolateral approach, patient's right knee space was injected with 4:1  marcaine 0.5%: Kenalog 40mg /dL. Patient tolerated the procedure well without immediate complications.  After informed written and verbal consent, patient was seated on exam table. Left knee was prepped with alcohol swab and utilizing anterolateral approach, patient's left knee space was injected with 4:1  marcaine 0.5%: Kenalog 40mg /dL. Patient tolerated the procedure well without immediate complications.  After verbal consent patient was prepped in sterile fashion with alcohol swabs. Ethyl chloride used patient was injected with a 22-gauge 3 inch needle into the RIGHT lateral hip in the greater trochanteric area under ultrasound guidance. Picture was taken. Patient had 4 cc of 0.5% Marcaine and 1 cc of Kenalog 40 mg/dL injected. Patient tolerated the procedure well and no blood loss. Pain completely resolved after injection stating proper placement. Post injection instructions given.   Impression and Recommendations:     The above documentation has been reviewed and is accurate and complete Kevin Pulley, DO       Note: This dictation was prepared with Dragon dictation along with smaller phrase technology. Any transcriptional errors that result from this process are unintentional.

## 2020-02-04 ENCOUNTER — Ambulatory Visit: Payer: Medicare HMO | Admitting: Family Medicine

## 2020-02-04 ENCOUNTER — Ambulatory Visit (INDEPENDENT_AMBULATORY_CARE_PROVIDER_SITE_OTHER): Payer: Medicare HMO

## 2020-02-04 ENCOUNTER — Other Ambulatory Visit: Payer: Self-pay

## 2020-02-04 ENCOUNTER — Encounter: Payer: Self-pay | Admitting: Family Medicine

## 2020-02-04 VITALS — BP 120/70 | HR 66 | Ht 67.0 in | Wt 163.0 lb

## 2020-02-04 DIAGNOSIS — M7061 Trochanteric bursitis, right hip: Secondary | ICD-10-CM

## 2020-02-04 DIAGNOSIS — M25512 Pain in left shoulder: Secondary | ICD-10-CM | POA: Diagnosis not present

## 2020-02-04 DIAGNOSIS — G8929 Other chronic pain: Secondary | ICD-10-CM | POA: Diagnosis not present

## 2020-02-04 DIAGNOSIS — H5210 Myopia, unspecified eye: Secondary | ICD-10-CM | POA: Diagnosis not present

## 2020-02-04 DIAGNOSIS — M7062 Trochanteric bursitis, left hip: Secondary | ICD-10-CM | POA: Diagnosis not present

## 2020-02-04 DIAGNOSIS — G20 Parkinson's disease: Secondary | ICD-10-CM

## 2020-02-04 DIAGNOSIS — M170 Bilateral primary osteoarthritis of knee: Secondary | ICD-10-CM | POA: Diagnosis not present

## 2020-02-04 MED ORDER — GABAPENTIN 300 MG PO CAPS: 300.0000 mg | ORAL_CAPSULE | Freq: Every day | ORAL | 3 refills | Status: DC

## 2020-02-04 NOTE — Assessment & Plan Note (Signed)
I believe that some of it could be secondary to more of the tremor that patient does have but is either secondary to more of the Parkinson's or the essential tremor but difficult to say.  We discussed home exercise and icing regimen, which activities to do which wants to avoid.  Discussed increasing activity slowly.  Follow-up again in 4 to 8 weeks

## 2020-02-04 NOTE — Assessment & Plan Note (Signed)
Injection given today, tolerated the procedure well, discussed icing regimen and home exercises, increase activity slowly.  Discussed icing regimen.  Follow-up again in 4 to 8 weeks

## 2020-02-04 NOTE — Patient Instructions (Addendum)
Left shoulder xray today Both knees and the right hip injected today  Gabapentin 300 mg See me again in 2 months for more injections

## 2020-02-04 NOTE — Assessment & Plan Note (Signed)
Patient is having worsening symptoms but does also have an essential tremor overall we discussed posture exercises following up with primary care provider as well as neurologist if anything else needs to be done.

## 2020-02-04 NOTE — Assessment & Plan Note (Signed)
Bilateral injections given today.  Patient is having worsening back pain with mild radicular symptoms with patient decreasing the gabapentin.  Patient has been much more coherent recently though with the lower dose.  Patient went from 400 mg to 200 mg.  We discussed potentially going to 300 mg at this time.

## 2020-02-07 ENCOUNTER — Other Ambulatory Visit: Payer: Self-pay

## 2020-02-07 DIAGNOSIS — I6523 Occlusion and stenosis of bilateral carotid arteries: Secondary | ICD-10-CM

## 2020-02-10 ENCOUNTER — Other Ambulatory Visit: Payer: Self-pay

## 2020-02-10 ENCOUNTER — Encounter: Payer: Medicare HMO | Attending: Physical Medicine & Rehabilitation | Admitting: Registered Nurse

## 2020-02-10 ENCOUNTER — Encounter: Payer: Self-pay | Admitting: Registered Nurse

## 2020-02-10 VITALS — BP 145/77 | HR 90 | Ht 67.0 in | Wt 162.0 lb

## 2020-02-10 DIAGNOSIS — I739 Peripheral vascular disease, unspecified: Secondary | ICD-10-CM | POA: Insufficient documentation

## 2020-02-10 DIAGNOSIS — G894 Chronic pain syndrome: Secondary | ICD-10-CM | POA: Insufficient documentation

## 2020-02-10 DIAGNOSIS — Z79891 Long term (current) use of opiate analgesic: Secondary | ICD-10-CM | POA: Insufficient documentation

## 2020-02-10 DIAGNOSIS — M47816 Spondylosis without myelopathy or radiculopathy, lumbar region: Secondary | ICD-10-CM | POA: Insufficient documentation

## 2020-02-10 DIAGNOSIS — Z5181 Encounter for therapeutic drug level monitoring: Secondary | ICD-10-CM | POA: Insufficient documentation

## 2020-02-10 DIAGNOSIS — M25551 Pain in right hip: Secondary | ICD-10-CM | POA: Insufficient documentation

## 2020-02-10 DIAGNOSIS — M1712 Unilateral primary osteoarthritis, left knee: Secondary | ICD-10-CM | POA: Insufficient documentation

## 2020-02-10 MED ORDER — OXYCODONE-ACETAMINOPHEN 7.5-325 MG PO TABS
1.0000 | ORAL_TABLET | Freq: Three times a day (TID) | ORAL | 0 refills | Status: DC | PRN
Start: 2020-02-10 — End: 2020-03-11

## 2020-02-10 NOTE — Progress Notes (Signed)
Subjective:    Patient ID: Kevin [name], male    DOB: [birth year], 79 y.o.   MRN: [id number]  HPI: Kevin [name] is a 79 y.o. male whose appointment was changed to a virtual office visit to reduce the risk of exposure to the COVID-19 virus and to help Kevin [name]  remain healthy and safe. The virtual visit will also provide continuity of care.Kevin [name] agrees with virtual visit and verbalizes understanding. He states his  pain is located in his lower back. He rates his pain 4. His current exercise regime is walking and performing stretching exercises.  Kevin [name] Morphine equivalent is 33.33  MME. He is also prescribed Diazepam by Dr. [name]. We have discussed the black box warning of using opioids and benzodiazepines. I highlighted the dangers of using these drugs together and discussed the adverse events including respiratory suppression, overdose, cognitive impairment and importance of compliance with current regimen. We will continue to monitor and adjust as indicated.   Last Oral Swab was Performed on 09/12/2019, it was consistent.  Pain Inventory Average Pain 9 Pain Right Now 4 My pain is sharp  In the last 24 hours, has pain interfered with the following? General activity 4 Relation with others 4 Enjoyment of life 4 What TIME of day is your pain at its worst? daytime and evening Sleep (in general) Good  Pain is worse with: walking, bending, sitting, inactivity, standing and some activites Pain improves with: heat/ice and medication Relief from Meds: 5  Family History  Problem Relation Age of Onset  . Parkinsonism Father   . Dementia Father   . Cancer Father        Throat  . Colon cancer Mother        died in 48s  . Cancer Mother        Colon   Social History   Socioeconomic History  . Marital status: Married    Spouse name: Not on file  . Number of children: Not on file  . Years of education: Not on file  . Highest education level: Not on file  Occupational History  .  Occupation: retired    Comment: maintenance; electrician  Tobacco Use  . Smoking status: Former Smoker    Packs/day: 2.00    Years: 28.00    Pack years: 56.00    Types: Cigarettes    Quit date: 05/31/1987    Years since quitting: 32.7  . Smokeless tobacco: Never Used  . Tobacco comment: quit smoking "in my 40's"  Vaping Use  . Vaping Use: Never used  Substance and Sexual Activity  . Alcohol use: Not Currently    Alcohol/week: 0.0 standard drinks    Comment: rare, mikes hard lemonade  . Drug use: Not Currently    Types: Marijuana    Comment: hx of-4-5 yrs ago  . Sexual activity: Not Currently  Other Topics Concern  . Not on file  Social History Narrative   Married 33 years in 2015. Lived together for 12 years. 2 boys from first wife. 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       Hobbies: yardwork, [place] barn, woodwork, collect knive   Right handed   One story home   Caffeine yes   Social Determinants of Health   Financial Resource Strain:   . Difficulty of Paying Living  Expenses: Not on file  Food Insecurity:   . Worried About Charity fundraiser in the Last Year: Not on file  . Ran Out of Food in the Last Year: Not on file  Transportation Needs:   . Lack of Transportation (Medical): Not on file  . Lack of Transportation (Non-Medical): Not on file  Physical Activity:   . Days of Exercise per Week: Not on file  . Minutes of Exercise per Session: Not on file  Stress:   . Feeling of Stress : Not on file  Social Connections:   . Frequency of Communication with Friends and Family: Not on file  . Frequency of Social Gatherings with Friends and Family: Not on file  . Attends Religious Services: Not on file  . Active Member of Clubs or Organizations: Not on file  . Attends Archivist Meetings: Not on file  . Marital Status: Not on file   Past  Surgical History:  Procedure Laterality Date  . ABDOMINAL AORTIC ANEURYSM REPAIR  2010  . CAROTID ENDARTERECTOMY Left 09/20/2004  . CATARACT EXTRACTION, BILATERAL     2016-2017  . COLONOSCOPY    . 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 ENDARTERECTOMY Right 10/12/2015   Procedure: RIGHT CAROTID ENDARTERECTOMY WITH PATCH ANGIOPLASTY;  Surgeon: Elam Dutch, MD;  Location: [place];  Service: Vascular;  Laterality: Right;  . INGUINAL HERNIA REPAIR Left 02/05/2013   Procedure: HERNIA REPAIR INGUINAL ADULT;  Surgeon: Joyice Faster. Cornett, MD;  Location: [place];  Service: General;  Laterality: Left;  . INGUINAL HERNIA REPAIR Left   . INSERTION OF MESH Left 02/05/2013   Procedure: INSERTION OF MESH;  Surgeon: Joyice Faster. Cornett, MD;  Location: [place];  Service: General;  Laterality: Left;  . LEFT HEART CATH AND CORONARY ANGIOGRAPHY N/A 06/21/2018   Procedure: LEFT HEART CATH AND CORONARY ANGIOGRAPHY;  Surgeon: Jettie Booze, MD;  Location: [facility];  Service: Cardiovascular;  Laterality: N/A;  . SHOULDER ARTHROSCOPY WITH ROTATOR CUFF REPAIR AND SUBACROMIAL DECOMPRESSION Left 05/01/2014   Procedure: LEFT ARTHROSCOPY SHOULDER SUBACROMIAL DECOMPRESSION,DISTAL CLAVICAL RESECTION AND ROTATOR CUFF REPAIR;  Surgeon: Marin Shutter, MD;  Location: [place];  Service: Orthopedics;  Laterality: Left;  . TEE WITHOUT CARDIOVERSION N/A 06/27/2018   Procedure: TRANSESOPHAGEAL ECHOCARDIOGRAM (TEE);  Surgeon: Melrose Nakayama, MD;  Location: [place];  Service: Open Heart Surgery;  Laterality: N/A;  . TESTICLAR CYST EXCISION Left   . TONSILLECTOMY    . VASECTOMY     Past Surgical History:  Procedure  Laterality Date  . ABDOMINAL AORTIC ANEURYSM REPAIR  2010  . CAROTID ENDARTERECTOMY Left 09/20/2004  . CATARACT EXTRACTION, BILATERAL     2016-2017  . COLONOSCOPY    . 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 ENDARTERECTOMY Right 10/12/2015   Procedure: RIGHT CAROTID ENDARTERECTOMY WITH PATCH ANGIOPLASTY;  Surgeon: Elam Dutch, MD;  Location: [place];  Service: Vascular;  Laterality: Right;  . INGUINAL HERNIA REPAIR  Left 02/05/2013   Procedure: HERNIA REPAIR INGUINAL ADULT;  Surgeon: Joyice Faster. Cornett, MD;  Location: [place];  Service: General;  Laterality: Left;  . INGUINAL HERNIA REPAIR Left   . INSERTION OF MESH Left 02/05/2013   Procedure: INSERTION OF MESH;  Surgeon: Joyice Faster. Cornett, MD;  Location: [place];  Service: General;  Laterality: Left;  . LEFT HEART CATH AND CORONARY ANGIOGRAPHY N/A 06/21/2018   Procedure: LEFT HEART CATH AND CORONARY ANGIOGRAPHY;  Surgeon: Jettie Booze, MD;  Location: [facility];  Service: Cardiovascular;  Laterality: N/A;  . SHOULDER ARTHROSCOPY WITH ROTATOR CUFF REPAIR AND SUBACROMIAL DECOMPRESSION Left 05/01/2014   Procedure: LEFT ARTHROSCOPY SHOULDER SUBACROMIAL DECOMPRESSION,DISTAL CLAVICAL RESECTION AND ROTATOR CUFF REPAIR;  Surgeon: Marin Shutter, MD;  Location: [place];  Service: Orthopedics;  Laterality: Left;  . TEE WITHOUT CARDIOVERSION N/A 06/27/2018   Procedure: TRANSESOPHAGEAL ECHOCARDIOGRAM (TEE);  Surgeon: Melrose Nakayama, MD;  Location: [place];  Service: Open Heart Surgery;  Laterality: N/A;  . TESTICLAR CYST EXCISION Left   . TONSILLECTOMY    . VASECTOMY     Past Medical History:  Diagnosis Date  . Arthritis    DDD-  Lower Back  . Bradycardia   . CAP (community acquired pneumonia) 03/17/2015   "THAT'S WHAT THEY ARE THINKING; THEY ARE NOT SURE"  . Carotid artery occlusion   . Chest pain 06/19/2018  . Chronic lower back pain    DDD  . CKD (chronic kidney disease)    Dr. Corliss Parish  . 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 Diverticulosis   . Enlarged prostate    takes Flomax daily  . History of blood transfusion    "probably when they did aortic aneurysm"  . History of colon polyps    benign  . Hyperlipidemia   . Hypertension    takes Amlodipine and Zebeta daily  . Insomnia    takes Melatonin nightly  . Joint pain   . 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     . Muscle spasm    takes Zanaflex daily as needed  . Peripheral edema    takes Furosemide daily  . Pneumonia    hx of   . Rheumatic fever 1946  . Rheumatoid arthritis ([place])   . Short-term memory loss    minimal  . Shortness of breath dyspnea    with exertion but lasts very short period of time  . TIA (transient ischemic attack)    x 2;takes Plavix daily  . Urinary frequency    takes Flomax daily  . Urinary urgency    BP (!) 145/77 Comment: pt reported, virtual visit  Pulse 90 Comment: pt reported, virtual visit  Ht 5\' 7"  (1.702 m) Comment: pt reported, virtual visit  Wt 162 lb (73.5 kg) Comment: pt reported, virtual visit  BMI 25.37 kg/m   Opioid Risk Score:   Fall Risk Score:  `1  Depression screen PHQ 2/9  Depression screen [facility] 2/9 01/09/2020 12/13/2019 11/14/2019 05/09/2019 04/16/2019 04/16/2019 08/09/2018  Decreased Interest 0 0 0 1 0 0 0  Down, Depressed, Hopeless 0 0 0 1 0 0 0  PHQ - 2 Score 0 0 0 2 0 0 0  Altered sleeping - - - 0 - - -  Tired, decreased energy - - - 3 - - -  Change in appetite - - - 1 - - -  Feeling  bad or failure about yourself  - - - 1 - - -  Trouble concentrating - - - 1 - - -  Moving slowly or fidgety/restless - - - 0 - - -  PHQ-9 Score - - - 8 - - -  Some recent data might be hidden    Review of Systems  Musculoskeletal: Positive for back pain and gait problem.       Knee pain Hip pain  Neurological: Positive for tremors and weakness.  All other systems reviewed and are negative.      Objective:   Physical Exam Vitals and nursing note reviewed.  Musculoskeletal:     Comments: No Physical Exam Performed : Virtual Visit           Assessment & Plan:  1. Lumbar Spondylosis/ Lumbar Stenosis:02/10/2020 Continue current medication regime. Refilled:Oxycodone 7.5/325 mg one tabletevery 8 hoursas needed #80.01/09/2020 We will continue the opioid monitoring program, this consists of regular clinic visits, examinations, urine drug screen, pill counts as well as use of [place] Controlled Substance Reporting system. A 12 month History has been reviewed on the [place] Controlled Substance Reporting System on 02/10/2020. 2.Bilateral Knees OA/ChronicBilateralKnee Pain:No complaints todat. S/PBilateral KneeInjection by DrSmithon 12/09/2019, Orthopedist Following 02/10/2020. 3.BilateralGreater Trochanter Bursitis:No complaints today. Orthopedist Following.Continue to alternate Ice and Heat Therapy.02/10/2020  F/U in 1 month  Virtual Visit My-Chart Video Established Patient Location of Patient: In His Home Location of Provider In the Office  Total Time Spent: 12 Minutes

## 2020-02-12 ENCOUNTER — Other Ambulatory Visit: Payer: Self-pay | Admitting: Family Medicine

## 2020-02-20 ENCOUNTER — Ambulatory Visit (INDEPENDENT_AMBULATORY_CARE_PROVIDER_SITE_OTHER): Payer: Medicare HMO | Admitting: Vascular Surgery

## 2020-02-20 ENCOUNTER — Other Ambulatory Visit: Payer: Self-pay

## 2020-02-20 ENCOUNTER — Encounter: Payer: Self-pay | Admitting: Vascular Surgery

## 2020-02-20 ENCOUNTER — Ambulatory Visit (HOSPITAL_COMMUNITY)
Admission: RE | Admit: 2020-02-20 | Discharge: 2020-02-20 | Disposition: A | Discharge status: Home / Self Care | Payer: Medicare HMO | Source: Ambulatory Visit | Attending: Vascular Surgery | Admitting: Vascular Surgery

## 2020-02-20 VITALS — BP 135/69 | HR 63 | Temp 97.7°F | Resp 20 | Ht 67.0 in | Wt 166.0 lb

## 2020-02-20 DIAGNOSIS — I714 Abdominal aortic aneurysm, without rupture, unspecified: Secondary | ICD-10-CM

## 2020-02-20 DIAGNOSIS — R1907 Generalized intra-abdominal and pelvic swelling, mass and lump: Secondary | ICD-10-CM

## 2020-02-20 DIAGNOSIS — M059 Rheumatoid arthritis with rheumatoid factor, unspecified: Secondary | ICD-10-CM | POA: Diagnosis not present

## 2020-02-20 DIAGNOSIS — I6523 Occlusion and stenosis of bilateral carotid arteries: Secondary | ICD-10-CM

## 2020-02-20 DIAGNOSIS — N183 Chronic kidney disease, stage 3 unspecified: Secondary | ICD-10-CM | POA: Diagnosis not present

## 2020-02-20 NOTE — Progress Notes (Signed)
Referring Physician: Dr. Garret Reddish  Patient name: Kevin [name] MRN: [id number] DOB: 1940-06-08 Sex: male  REASON FOR CONSULT: Possible recurrent abdominal aortic aneurysm  HPI: Kevin [name] is a 79 y.o. male, who has had previous bilateral carotid endarterectomy.  He also had prior repair of ruptured abdominal aortic aneurysm.  This was an open repair in 2010.  He recently had an ultrasound of his abdomen which suggested 6.6 cm abdominal aortic aneurysm.  He has no abdominal or back pain.  He has been diagnosed recently with Parkinson's disease but he is fairly functional with this.  He does also have known renal dysfunction with a baseline creatinine of around 2.  He has not had any symptoms of TIA amaurosis or stroke.  He is on aspirin and a statin.  He has had some memory deficits which are thought to possibly be related to his Parkinson's disease.  He does not really complain of claudication.  Other medical problems include degenerative arthritis of the back, coronary artery disease status post CABG both have been stable.  Past Medical History:  Diagnosis Date  . Arthritis    DDD- Lower Back  . Bradycardia   . CAP (community acquired pneumonia) 03/17/2015   "THAT'S WHAT THEY ARE THINKING; THEY ARE NOT SURE"  . Carotid artery occlusion   . Chest pain 06/19/2018  . Chronic lower back pain    DDD  . CKD (chronic kidney disease)    Dr. Corliss Parish  . 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 Diverticulosis   . Enlarged prostate    takes Flomax daily  . History of blood transfusion    "probably when they did aortic aneurysm"  . History of colon polyps    benign  . Hyperlipidemia   . Hypertension    takes Amlodipine and Zebeta daily  . Insomnia    takes Melatonin nightly  . Joint pain   . 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     . Muscle spasm    takes Zanaflex daily as needed  . Peripheral edema    takes Furosemide daily  . Pneumonia    hx of   . Rheumatic fever 1946  . Rheumatoid arthritis ([place])   . Short-term memory loss    minimal  . Shortness of breath dyspnea    with exertion but lasts very short period of time  . TIA (transient ischemic attack)    x 2;takes Plavix daily  . Urinary frequency    takes Flomax daily  . Urinary urgency    Past Surgical History:  Procedure Laterality Date  . ABDOMINAL AORTIC ANEURYSM REPAIR  2010  . CAROTID ENDARTERECTOMY Left 09/20/2004  . CATARACT EXTRACTION, BILATERAL     2016-2017  . COLONOSCOPY    . CORONARY ARTERY BYPASS GRAFT N/A 06/27/2018   Procedure: CORONARY ARTERY BYPASS GRAFTING (CABG), ON PUMP, TIMES FOUR, USING LEFT INTERNAL MAMMARY ARTERY AND ENDOSCOPICALLY HARVESTED LEFT GREATER SAPHENOUS VEIN;  Surgeon: Melrose Nakayama, MD;  Location: [facility];  Service: Open Heart Surgery;  Laterality: N/A;  . ENDARTERECTOMY Right 10/12/2015   Procedure: RIGHT CAROTID ENDARTERECTOMY WITH PATCH ANGIOPLASTY;  Surgeon: Elam Dutch, MD;  Location: [facility];  Service: Vascular;  Laterality: Right;  . INGUINAL HERNIA REPAIR Left 02/05/2013  Procedure: HERNIA REPAIR INGUINAL ADULT;  Surgeon: Joyice Faster. Cornett, MD;  Location: [facility];  Service: General;  Laterality: Left;  . INGUINAL HERNIA REPAIR Left   . INSERTION OF MESH Left 02/05/2013   Procedure: INSERTION OF MESH;  Surgeon: Joyice Faster. Cornett, MD;  Location: [facility];  Service: General;  Laterality: Left;  . LEFT HEART CATH AND CORONARY ANGIOGRAPHY N/A 06/21/2018   Procedure: LEFT HEART CATH AND CORONARY ANGIOGRAPHY;  Surgeon: Jettie Booze, MD;  Location: [facility];  Service: Cardiovascular;  Laterality: N/A;  . SHOULDER ARTHROSCOPY WITH ROTATOR CUFF REPAIR AND SUBACROMIAL DECOMPRESSION Left 05/01/2014   Procedure:  LEFT ARTHROSCOPY SHOULDER SUBACROMIAL DECOMPRESSION,DISTAL CLAVICAL RESECTION AND ROTATOR CUFF REPAIR;  Surgeon: Marin Shutter, MD;  Location: [facility];  Service: Orthopedics;  Laterality: Left;  . TEE WITHOUT CARDIOVERSION N/A 06/27/2018   Procedure: TRANSESOPHAGEAL ECHOCARDIOGRAM (TEE);  Surgeon: Melrose Nakayama, MD;  Location: [facility];  Service: Open Heart Surgery;  Laterality: N/A;  . TESTICLAR CYST EXCISION Left   . TONSILLECTOMY    . VASECTOMY      Family History  Problem Relation Age of Onset  . Parkinsonism Father   . Dementia Father   . Cancer Father        Throat  . Colon cancer Mother        died in 72s  . Cancer Mother        Colon    SOCIAL HISTORY: Social History   Socioeconomic History  . Marital status: Married    Spouse name: Not on file  . Number of children: Not on file  . Years of education: Not on file  . Highest education level: Not on file  Occupational History  . Occupation: retired    Comment: maintenance; electrician  Tobacco Use  . Smoking status: Former Smoker    Packs/day: 2.00    Years: 28.00    Pack years: 56.00    Types: Cigarettes    Quit date: 05/31/1987    Years since quitting: 32.7  . Smokeless tobacco: Never Used  . Tobacco comment: quit smoking "in my 40's"  Vaping Use  . Vaping Use: Never used  Substance and Sexual Activity  . Alcohol use: Not Currently    Alcohol/week: 0.0 standard drinks    Comment: rare, mikes hard lemonade  . Drug use: Not Currently    Types: Marijuana    Comment: hx of-4-5 yrs ago  . Sexual activity: Not Currently  Other Topics Concern  . Not on file  Social History Narrative   Married 33 years in 2015. Lived together for 12 years. 2 boys from first wife. 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       Hobbies: yardwork, [place] barn, woodwork, collect knive   Right handed   One story  home   Caffeine yes   Social Determinants of Health   Financial Resource Strain:   . Difficulty of Paying Living Expenses: Not on file  Food Insecurity:   . Worried About Charity fundraiser in the Last Year: Not on file  . Ran Out of Food in the Last Year: Not on file  Transportation Needs:   . Lack of Transportation (Medical): Not on file  . Lack of Transportation (Non-Medical): Not on file  Physical Activity:   .  Days of Exercise per Week: Not on file  . Minutes of Exercise per Session: Not on file  Stress:   . Feeling of Stress : Not on file  Social Connections:   . Frequency of Communication with Friends and Family: Not on file  . Frequency of Social Gatherings with Friends and Family: Not on file  . Attends Religious Services: Not on file  . Active Member of Clubs or Organizations: Not on file  . Attends Archivist Meetings: Not on file  . Marital Status: Not on file  Intimate Partner Violence:   . Fear of Current or Ex-Partner: Not on file  . Emotionally Abused: Not on file  . Physically Abused: Not on file  . Sexually Abused: Not on file    Allergies  Allergen Reactions  . Hydralazine Other (See Comments)    Pt stated he feels drunk  . Mirtazapine Other (See Comments)    Very tired, feeling drunk, loopy  . Sinemet [Carbidopa-Levodopa] Other (See Comments)    GI symptoms     Current Outpatient Medications  Medication Sig Dispense Refill  . albuterol (VENTOLIN HFA) 108 (90 Base) MCG/ACT inhaler Inhale 2 puffs into the lungs every 12 (twelve) hours as needed for wheezing or shortness of breath. 1 each 6  . amLODipine (NORVASC) 10 MG tablet TAKE 1 TABLET EVERY DAY 90 tablet 1  . aspirin EC 81 MG EC tablet Take 1 tablet (81 mg total) by mouth daily.    . cilostazol (PLETAL) 50 MG tablet Take 1 tablet by mouth twice daily 60 tablet 0  . fenofibrate 160 MG tablet Take 1 tablet (160 mg total) by mouth daily. 90 tablet 3  . furosemide (LASIX) 20 MG tablet  TAKE 1 TABLET EVERY DAY 90 tablet 1  . gabapentin (NEURONTIN) 100 MG capsule Two tabs twice a day (Patient taking differently: Take 300 mg by mouth 2 (two) times daily. Two tabs twice a day) 360 capsule 3  . gabapentin (NEURONTIN) 300 MG capsule Take 1 capsule (300 mg total) by mouth at bedtime. 90 capsule 3  . linaclotide (LINZESS) 145 MCG CAPS capsule Take 145 mcg by mouth as needed (for stomach).     . Melatonin 10 MG TABS Take 10 mg by mouth at bedtime.     . metoprolol tartrate (LOPRESSOR) 25 MG tablet TAKE 1 TABLET TWICE DAILY 180 tablet 3  . oxyCODONE-acetaminophen (PERCOCET) 7.5-325 MG tablet Take 1 tablet by mouth every 8 (eight) hours as needed for severe pain. 80 tablet 0  . rosuvastatin (CRESTOR) 40 MG tablet Take 1 tablet (40 mg total) by mouth daily. 90 tablet 3  . tamsulosin (FLOMAX) 0.4 MG CAPS capsule Take 1 capsule (0.4 mg total) by mouth daily. 90 capsule 3  . tiZANidine (ZANAFLEX) 4 MG tablet TAKE 1 TABLET EVERY 6 HOURS AS NEEDED FOR MUSCLE SPASM(S) 90 tablet 1   No current facility-administered medications for this visit.    ROS:   General:  No weight loss, Fever, chills  HEENT: No recent headaches, no nasal bleeding, no visual changes, no sore throat  Neurologic: No dizziness, blackouts, seizures. No recent symptoms of stroke or mini- stroke. No recent episodes of slurred speech, or temporary blindness.  Cardiac: No recent episodes of chest pain/pressure, no shortness of breath at rest.  + shortness of breath with exertion.  Denies history of atrial fibrillation or irregular heartbeat  Vascular: No history of rest pain in feet.  No history of claudication.  No history  of non-healing ulcer, No history of DVT   Pulmonary: No home oxygen, no productive cough, no hemoptysis,  No asthma or wheezing  Musculoskeletal:  [X]  Arthritis, [X]  Low back pain,  [X]  Joint pain  Hematologic:No history of hypercoagulable state.  No history of easy bleeding.  No history of  anemia  Gastrointestinal: No hematochezia or melena,  No gastroesophageal reflux, no trouble swallowing  Urinary: [X]  chronic Kidney disease, [ ]  on HD - [ ]  MWF or [ ]  TTHS, [ ]  Burning with urination, [ ]  Frequent urination, [ ]  Difficulty urinating;   Skin: No rashes  Psychological: No history of anxiety,  No history of depression   Physical Examination  Vitals:   02/20/20 0929 02/20/20 0931  BP: 129/71 135/69  Pulse: 63   Resp: 20   Temp: 97.7 F (36.5 C)   SpO2: 95%   Weight: 166 lb (75.3 kg)   Height: 5\' 7"  (1.702 m)     Body mass index is 26 kg/m.  General:  Alert and oriented, no acute distress HEENT: Normal Neck: No JVD Pulmonary: Clear to auscultation bilaterally Cardiac: Regular Rate and Rhythm Abdomen: Soft, non-tender, non-distended, no mass Skin: No rash Extremity Pulses:  2+ radial, brachial, femoral pulses bilaterally Musculoskeletal: No deformity or edema  Neurologic: Upper and lower extremity motor 5/5 and symmetric  DATA:  Patient had bilateral carotid duplex exam today which showed no recurrent stenosis bilaterally.  I also reviewed the patient's recent ultrasound of the aorta from July 2021 as well as a CT scan of the abdomen and pelvis from May 2020.  Aortic wall diameter he is around 6 cm.  However in comparison with the CT scan most likely this is the aortic wall that was closed over his previous graft repair.  The contrast does not opacify well from the CT scan of May 2020.  However that is what the appearance seems to be.  Certainly he could have anastomotic recurrence of the aneurysm and this would not be discernible from the ultrasound or previous CT scan.  ASSESSMENT: 1.  Carotid occlusive disease no recurrent stenosis will need probably no further carotid duplex scans in the future unless he has new symptoms as he has been stable for several years  2.  Possible recurrent abdominal aortic aneurysm.  Patient has an elevated creatinine so he is  not a candidate for contrast with MRI or with CT.  I do not believe a noncontrast CT scan is going to give [place] the imaging information we would need.  I will discuss with radiology setting him up for a noncontrast MRA of the abdomen and pelvis.  This should give [place] the information we need on whether or not there is anastomotic recurrence and whether or not luminal flow is through the graft and no evidence of recurrent aneurysm.  All this was discussed with the patient and his wife today.  We will schedule this in the next few weeks.   PLAN: See above   [name], MD [facility] Office: [phone]

## 2020-03-03 ENCOUNTER — Other Ambulatory Visit: Payer: Self-pay

## 2020-03-03 ENCOUNTER — Ambulatory Visit (HOSPITAL_COMMUNITY)
Admission: RE | Admit: 2020-03-03 | Discharge: 2020-03-03 | Disposition: A | Discharge status: Home / Self Care | Payer: Medicare HMO | Source: Ambulatory Visit | Attending: Vascular Surgery | Admitting: Vascular Surgery

## 2020-03-03 DIAGNOSIS — I714 Abdominal aortic aneurysm, without rupture, unspecified: Secondary | ICD-10-CM

## 2020-03-03 DIAGNOSIS — I712 Thoracic aortic aneurysm, without rupture: Secondary | ICD-10-CM | POA: Diagnosis not present

## 2020-03-03 DIAGNOSIS — I708 Atherosclerosis of other arteries: Secondary | ICD-10-CM | POA: Diagnosis not present

## 2020-03-03 DIAGNOSIS — R1907 Generalized intra-abdominal and pelvic swelling, mass and lump: Secondary | ICD-10-CM | POA: Insufficient documentation

## 2020-03-03 DIAGNOSIS — I771 Stricture of artery: Secondary | ICD-10-CM | POA: Diagnosis not present

## 2020-03-03 DIAGNOSIS — I70203 Unspecified atherosclerosis of native arteries of extremities, bilateral legs: Secondary | ICD-10-CM | POA: Diagnosis not present

## 2020-03-03 DIAGNOSIS — M4186 Other forms of scoliosis, lumbar region: Secondary | ICD-10-CM | POA: Diagnosis not present

## 2020-03-04 ENCOUNTER — Ambulatory Visit (HOSPITAL_COMMUNITY): Payer: Medicare HMO

## 2020-03-04 ENCOUNTER — Other Ambulatory Visit (HOSPITAL_COMMUNITY): Payer: Medicare HMO

## 2020-03-05 ENCOUNTER — Encounter: Payer: Self-pay | Admitting: Vascular Surgery

## 2020-03-05 ENCOUNTER — Ambulatory Visit: Payer: Medicare HMO | Admitting: Vascular Surgery

## 2020-03-05 ENCOUNTER — Other Ambulatory Visit: Payer: Self-pay | Admitting: Family Medicine

## 2020-03-05 ENCOUNTER — Other Ambulatory Visit: Payer: Self-pay

## 2020-03-05 VITALS — BP 116/72 | HR 69 | Temp 98.2°F | Resp 20 | Ht 67.0 in | Wt 163.0 lb

## 2020-03-05 DIAGNOSIS — I714 Abdominal aortic aneurysm, without rupture, unspecified: Secondary | ICD-10-CM

## 2020-03-05 NOTE — Progress Notes (Signed)
Patient is an 79 year old male who returns for follow-up today.  He recently had an ultrasound which suggested 8 cm abdominal aortic aneurysm.  This was thought on his last evaluation to most likely be where the sac had been closed over his previous dacryon repair for ruptured abdominal aortic aneurysm.  He returns today for further follow-up regarding the MRA exam that was done to evaluate this.  He could not receive contrast due to renal dysfunction.  His ruptured aneurysm was repaired in 2010.  Physical exam:   Vitals:   03/05/20 1042  BP: 116/72  Pulse: 69  Resp: 20  Temp: 98.2 F (36.8 C)  SpO2: 96%  Weight: 163 lb (73.9 kg)  Height: 5\' 7"  (1.702 m)    Data: Patient had an MRA of the abdomen using time-of-flight rather than contrast.  This showed no evidence of recurrent aneurysm disease.  No perianastomotic aneurysm.  The native aorta closed over the dacryon graft is 7 cm in diameter.  However this is not clinically relevant it is no longer in the flow channel circuit.  I reviewed and interpreted these images.  Assessment: No evidence of recurrent aneurysm 10 years post repair of rupture.  No significant carotid occlusive disease at this point.  Plan: Patient can follow-up on an as-needed basis.  He is moving to [place] in the near future.  I told him he could certainly take any records from here that he needed for his care down there although I do not currently believe he needs any vascular surgery follow-up.  There are several good vascular surgeons in the [facility] area if he needs to be seen in the future.  [name], MD [facility] Office: [phone]

## 2020-03-09 ENCOUNTER — Other Ambulatory Visit: Payer: Self-pay

## 2020-03-09 ENCOUNTER — Encounter: Payer: Self-pay | Admitting: Family Medicine

## 2020-03-09 ENCOUNTER — Ambulatory Visit (INDEPENDENT_AMBULATORY_CARE_PROVIDER_SITE_OTHER): Payer: Medicare HMO | Admitting: Family Medicine

## 2020-03-09 VITALS — BP 128/82 | HR 65 | Temp 97.6°F | Ht 67.0 in | Wt 163.4 lb

## 2020-03-09 DIAGNOSIS — I10 Essential (primary) hypertension: Secondary | ICD-10-CM

## 2020-03-09 DIAGNOSIS — E785 Hyperlipidemia, unspecified: Secondary | ICD-10-CM

## 2020-03-09 DIAGNOSIS — G894 Chronic pain syndrome: Secondary | ICD-10-CM | POA: Diagnosis not present

## 2020-03-09 MED ORDER — GABAPENTIN 400 MG PO CAPS
400.0000 mg | ORAL_CAPSULE | Freq: Two times a day (BID) | ORAL | 3 refills | Status: DC
Start: 2020-03-09 — End: 2020-10-07

## 2020-03-09 NOTE — Progress Notes (Signed)
Phone [phone] In person visit   Subjective:   Kevin [name] is a 79 y.o. year old very pleasant male patient who presents for/with See problem oriented charting Chief Complaint  Patient presents with  . Hyperlipidemia  . Hypertension   This visit occurred during the SARS-CoV-2 public health emergency.  Safety protocols were in place, including screening questions prior to the visit, additional usage of staff PPE, and extensive cleaning of exam room while observing appropriate contact time as indicated for disinfecting solutions.   Past Medical History-  Patient Active Problem List   Diagnosis Date Noted  . Parkinson's disease ([place]) 11/18/2019    Priority: High  . Afib ([place]) 07/06/2018    Priority: High  . 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 PAD (peripheral artery disease) ([place]) 01/08/2016    Priority: High  . Diastolic CHF ([place]) 58/01/9832    Priority: High  . CKD (chronic kidney disease), stage IV ([place]) 01/27/2014    Priority: High  . Carotid artery stenosis s/p L carotid endarterectomy 01/12/2012    Priority: High  . History of CVA (cerebrovascular accident) 11/17/2011    Priority: High  . Chronic pain syndrome 12/23/2009    Priority: High  . Constipation 09/19/2017    Priority: Medium  . Personal history of colonic polyps 05/15/2013    Priority: Medium  . Essential tremor 10/05/2009    Priority: Medium  . UNSPECIFIED ANEMIA 12/05/2008    Priority: Medium  . BPH (benign prostatic hyperplasia) 06/04/2007    Priority: Medium  . Fasting hyperglycemia 02/05/2007    Priority: Medium  . Hyperlipidemia 01/03/2007    Priority: Medium  . Essential hypertension 01/03/2007    Priority: Medium  . Hemiparesis ([place]) 04/16/2019    Priority: Low  . CAP (community acquired pneumonia) 03/17/2015    Priority: Low  . Family history of malignant neoplasm of gastrointestinal tract 05/15/2013    Priority: Low  . Inguinal hernia 05/01/2012    Priority: Low  . BURSITIS,  HIP 12/21/2009    Priority: Low  . ACTINIC KERATOSIS, EAR, LEFT 03/09/2009    Priority: Low  . 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 Left shoulder pain 02/04/2020  . Loss of weight 10/08/2019  . Greater trochanteric bursitis of both hips 02/26/2019  . Degenerative arthritis of knee, bilateral 01/14/2019  . SIRS (systemic inflammatory response syndrome) ([place]) 01/08/2019  . Chest pain 01/08/2019  . Atelectasis of left lung 10/30/2018  . Lumbar spondylosis 03/17/2017    Medications- reviewed and updated Current Outpatient Medications  Medication Sig Dispense Refill  . albuterol (VENTOLIN HFA) 108 (90 Base) MCG/ACT inhaler Inhale 2 puffs into the lungs every 12 (twelve) hours as needed for wheezing or shortness of breath. 1 each 6  . amLODipine (NORVASC) 10 MG tablet TAKE 1 TABLET EVERY DAY 90 tablet 1  . aspirin EC 81 MG EC tablet Take 1 tablet (81 mg total) by mouth daily.    . cilostazol (PLETAL) 50 MG tablet Take 1 tablet by mouth twice daily 60 tablet 0  . fenofibrate 160 MG tablet Take 1 tablet (160 mg total) by mouth daily. 90 tablet 3  . furosemide (LASIX) 20 MG tablet TAKE 1 TABLET EVERY DAY 90 tablet 1  . gabapentin (NEURONTIN) 100 MG capsule Two tabs twice a day (Patient taking differently: Take 300 mg by mouth 2 (two) times daily. Two tabs  twice a day) 360 capsule 3  . gabapentin (NEURONTIN) 300 MG capsule Take 1 capsule (300 mg total) by mouth at bedtime. 90 capsule 3  . linaclotide (LINZESS) 145 MCG CAPS capsule Take 145 mcg by mouth as needed (for stomach).     . Melatonin 10 MG TABS Take 10 mg by mouth at bedtime.     . metoprolol tartrate (LOPRESSOR) 25 MG tablet TAKE 1 TABLET TWICE DAILY 180 tablet 3  . oxyCODONE-acetaminophen (PERCOCET) 7.5-325 MG tablet Take 1 tablet by mouth every 8 (eight) hours as needed for severe pain. 80 tablet 0  . rosuvastatin (CRESTOR) 40 MG tablet Take 1 tablet (40  mg total) by mouth daily. 90 tablet 3  . tamsulosin (FLOMAX) 0.4 MG CAPS capsule Take 1 capsule (0.4 mg total) by mouth daily. 90 capsule 3  . tiZANidine (ZANAFLEX) 4 MG tablet TAKE 1 TABLET EVERY 6 HOURS AS NEEDED FOR MUSCLE SPASM(S) 90 tablet 1   No current facility-administered medications for this visit.     Objective:  BP 128/82   Pulse 65   Temp 97.6 F (36.4 C) (Temporal)   Ht 5\' 7"  (1.702 m)   Wt 163 lb 6.4 oz (74.1 kg)   SpO2 98%   BMI 25.59 kg/m  Gen: NAD, resting comfortably CV: RRR no murmurs rubs or gallops Lungs: CTAB no crackles, wheeze, rhonchi Ext: no edema Skin: warm, dry     Assessment and Plan   #Social update- Moving back to [place]- his son has peace of land they can move in on- wife will have more support. Possibly by November. Here since 1994.  I have cared for patient for several years and I was honest with him the transition was somewhat emotional for me to learn of.   # Chronic pain syndrome- back, R hip, left shoulder, left knee. Follows with pain management S:Being off the valium/diazepam has made him more mentally clear but doesn't always sleep as well as a trade off.   Attempted to go down on gabapentin but did not do as well going from 300 to 200mg  BID as he did from 400 mg to 300mg  BID. Unfortunately oxycodone has been more consistent with the decrease in gabapentin now typically 3x a day up from 2x a day. Back really bothering him. Tizanidine only before bed.   Per Dr. Carles Collet with neurology we have tried to wean medicines like gabapentin, diazepam, tizanidine, oxycodone. -doesn't think he can tolerate being off oxycodone -No further weight loss other than within a pound of  last visit  Weight within a pound A/P: Pain control worsening with decreasing gabapentin-now using oxycodone 3 times a day-we will go back up on gabapentin to see if that reduces oxycodone need and also try to stop tizanidine.  Thrilled he has done well off diazepam  with more mental clarity.  No recent reported spells of altered consciousness/strange behavior. From AVS:  " Lets go back on gabapentin to 400mg  twice a day and see if that reduces need for oxycodone back to twice daily for the most part.   If you do really well with this after 2 weeks- can trial off tizanidine at bedtime."  #hypertension S: medication: Amlodipine 10 mg, Lasix 20 mg, Toprol 25 mg twice daily BP Readings from Last 3 Encounters:  03/09/20 128/82  03/05/20 116/72  02/20/20 135/69  A/P: Blood pressure is well controlled-continue current medications  #hyperlipidemia S: Medication:  rosuvastatin 40mg  alone (had been doing atorvastatin same time last visit)  Lab Results  Component Value Date   CHOL 150 07/08/2019   HDL 37.50 (L) 07/08/2019   LDLCALC 81 07/08/2019   LDLDIRECT 134.9 12/26/2012   TRIG 158.0 (H) 07/08/2019   CHOLHDL 4 07/08/2019   A/P: Consider updating LDL next visit to see how he is doing on gabapentin alone  #Peripheral vascular disease-no recurrent stenosis and Dr. Oneida Alar has released him from further carotid imaging unless he has new symptoms.  Recent MRI of the abdomen with no evidence of recurrent aneurysm 10 years status post repair of rupture-patient has been released from vascular surgery at this point  #Declines flu shot-not yet due for COVID-19 booster  Recommended follow up: Return in about 2 months (around 05/09/2020). Future Appointments  Date Time Provider [place]  03/11/2020 10:00 AM Bayard Hugger, NP [facility] CPR  03/26/2020  3:30 PM Tat, Eustace Quail, DO [facility] None  04/07/2020  1:00 PM Lyndal Pulley, DO [facility] None  05/12/2020  1:00 PM [name] Brayton Mars, MD [facility]    Lab/Order associations:   ICD-10-CM   1. Chronic pain syndrome  G89.4   2. Essential hypertension  I10   3. Hyperlipidemia, unspecified hyperlipidemia type  E78.5     Meds ordered this encounter  Medications  . gabapentin (NEURONTIN) 400 MG  capsule    Sig: Take 1 capsule (400 mg total) by mouth in the morning and at bedtime.    Dispense:  180 capsule    Refill:  3    Return precautions advised.  Garret Reddish, MD

## 2020-03-09 NOTE — Patient Instructions (Addendum)
Health Maintenance Due  Topic Date Due  . INFLUENZA VACCINE Declined in office flu shot.  12/29/2019   Lets go back on gabapentin to 400mg  twice a day and see if that reduces need for oxycodone back to twice daily for the most part.   If you do really well with this after 2 weeks- can trial off tizanidine at bedtime.   Recommended follow up: Return in about 2 months (around 05/09/2020).

## 2020-03-11 ENCOUNTER — Encounter: Payer: Self-pay | Admitting: Registered Nurse

## 2020-03-11 ENCOUNTER — Encounter: Payer: Medicare HMO | Attending: Physical Medicine & Rehabilitation | Admitting: Registered Nurse

## 2020-03-11 ENCOUNTER — Other Ambulatory Visit: Payer: Self-pay

## 2020-03-11 VITALS — BP 139/74 | HR 61 | Temp 98.0°F | Ht 67.0 in | Wt 162.0 lb

## 2020-03-11 DIAGNOSIS — M47816 Spondylosis without myelopathy or radiculopathy, lumbar region: Secondary | ICD-10-CM | POA: Diagnosis not present

## 2020-03-11 DIAGNOSIS — Z5181 Encounter for therapeutic drug level monitoring: Secondary | ICD-10-CM | POA: Diagnosis not present

## 2020-03-11 DIAGNOSIS — Z79891 Long term (current) use of opiate analgesic: Secondary | ICD-10-CM | POA: Insufficient documentation

## 2020-03-11 DIAGNOSIS — G894 Chronic pain syndrome: Secondary | ICD-10-CM | POA: Diagnosis not present

## 2020-03-11 MED ORDER — OXYCODONE-ACETAMINOPHEN 7.5-325 MG PO TABS
1.0000 | ORAL_TABLET | Freq: Three times a day (TID) | ORAL | 0 refills | Status: DC | PRN
Start: 2020-03-11 — End: 2020-04-09

## 2020-03-11 NOTE — Progress Notes (Signed)
Subjective:    Patient ID: MARKEESE BOYAJIAN, male    DOB: 03/23/1941, 79 y.o.   MRN: [id number]  HPI: KALIQ LEGE is a 79 y.o. male who returns for follow up appointment for chronic pain and medication refill. He states his pain is located in his lower back. He rates his pain 8. His current exercise regime is walking short distances with his cane.   Mr. Ospina Morphine equivalent is 33.33 MME.   Last Oral Swab was Performed on 09/12/2019, it was consistent.   Mrs. Sigel in the room.    Pain Inventory Average Pain 9 Pain Right Now 8 My pain is constant, sharp, burning, stabbing and aching  In the last 24 hours, has pain interfered with the following? General activity 1 Relation with others 1 Enjoyment of life 1 What TIME of day is your pain at its worst? morning , daytime, evening and night Sleep (in general) Fair  Pain is worse with: walking, bending and standing Pain improves with: rest, medication and heat Relief from Meds: 6  Family History  Problem Relation Age of Onset  . Parkinsonism Father   . Dementia Father   . Cancer Father        Throat  . Colon cancer Mother        died in 37s  . Cancer Mother        Colon   Social History   Socioeconomic History  . Marital status: Married    Spouse name: Not on file  . Number of children: Not on file  . Years of education: Not on file  . Highest education level: Not on file  Occupational History  . Occupation: retired    Comment: maintenance; electrician  Tobacco Use  . Smoking status: Former Smoker    Packs/day: 2.00    Years: 28.00    Pack years: 56.00    Types: Cigarettes    Quit date: 05/31/1987    Years since quitting: 32.8  . Smokeless tobacco: Never Used  . Tobacco comment: quit smoking "in my 40's"  Vaping Use  . Vaping Use: Never used  Substance and Sexual Activity  . Alcohol use: Not Currently    Alcohol/week: 0.0 standard drinks    Comment: rare, mikes hard lemonade  . Drug use: Not Currently     Types: Marijuana    Comment: hx of-4-5 yrs ago  . Sexual activity: Not Currently  Other Topics Concern  . Not on file  Social History Narrative   Married 33 years in 2015. Lived together for 12 years. 2 boys from first wife. 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       Hobbies: yardwork, [place] barn, woodwork, collect knive   Right handed   One story home   Caffeine yes   Social Determinants of Health   Financial Resource Strain:   . Difficulty of Paying Living Expenses: Not on file  Food Insecurity:   . Worried About Charity fundraiser in the Last Year: Not on file  . Ran Out of Food in the Last Year: Not on file  Transportation Needs:   . Lack of Transportation (Medical): Not on file  . Lack of Transportation (Non-Medical): Not on file  Physical Activity:   . Days of Exercise per Week: Not on file  . Minutes of  Exercise per Session: Not on file  Stress:   . Feeling of Stress : Not on file  Social Connections:   . Frequency of Communication with Friends and Family: Not on file  . Frequency of Social Gatherings with Friends and Family: Not on file  . Attends Religious Services: Not on file  . Active Member of Clubs or Organizations: Not on file  . Attends Archivist Meetings: Not on file  . Marital Status: Not on file   Past Surgical History:  Procedure Laterality Date  . ABDOMINAL AORTIC ANEURYSM REPAIR  2010  . CAROTID ENDARTERECTOMY Left 09/20/2004  . CATARACT EXTRACTION, BILATERAL     2016-2017  . COLONOSCOPY    . CORONARY ARTERY BYPASS GRAFT N/A 06/27/2018   Procedure: CORONARY ARTERY BYPASS GRAFTING (CABG), ON PUMP, TIMES FOUR, USING LEFT INTERNAL MAMMARY ARTERY AND ENDOSCOPICALLY HARVESTED LEFT GREATER SAPHENOUS VEIN;  Surgeon: Melrose Nakayama, MD;  Location: [facility];  Service: Open Heart Surgery;  Laterality: N/A;  . ENDARTERECTOMY  Right 10/12/2015   Procedure: RIGHT CAROTID ENDARTERECTOMY WITH PATCH ANGIOPLASTY;  Surgeon: Elam Dutch, MD;  Location: [facility];  Service: Vascular;  Laterality: Right;  . INGUINAL HERNIA REPAIR Left 02/05/2013   Procedure: HERNIA REPAIR INGUINAL ADULT;  Surgeon: Joyice Faster. Cornett, MD;  Location: [name];  Service: General;  Laterality: Left;  . INGUINAL HERNIA REPAIR Left   . INSERTION OF MESH Left 02/05/2013   Procedure: INSERTION OF MESH;  Surgeon: Joyice Faster. Cornett, MD;  Location: [facility];  Service: General;  Laterality: Left;  . LEFT HEART CATH AND CORONARY ANGIOGRAPHY N/A 06/21/2018   Procedure: LEFT HEART CATH AND CORONARY ANGIOGRAPHY;  Surgeon: Jettie Booze, MD;  Location: [facility];  Service: Cardiovascular;  Laterality: N/A;  . SHOULDER ARTHROSCOPY WITH ROTATOR CUFF REPAIR AND SUBACROMIAL DECOMPRESSION Left 05/01/2014   Procedure: LEFT ARTHROSCOPY SHOULDER SUBACROMIAL DECOMPRESSION,DISTAL CLAVICAL RESECTION AND ROTATOR CUFF REPAIR;  Surgeon: Marin Shutter, MD;  Location: [facility];  Service: Orthopedics;  Laterality: Left;  . TEE WITHOUT CARDIOVERSION N/A 06/27/2018   Procedure: TRANSESOPHAGEAL ECHOCARDIOGRAM (TEE);  Surgeon: Melrose Nakayama, MD;  Location: [facility];  Service: Open Heart Surgery;  Laterality: N/A;  . TESTICLAR CYST EXCISION Left   . TONSILLECTOMY    . VASECTOMY     Past Surgical History:  Procedure Laterality Date  . ABDOMINAL AORTIC ANEURYSM REPAIR  2010  . CAROTID ENDARTERECTOMY Left 09/20/2004  . CATARACT EXTRACTION, BILATERAL     2016-2017  . COLONOSCOPY    . CORONARY ARTERY BYPASS GRAFT N/A 06/27/2018   Procedure: CORONARY ARTERY BYPASS GRAFTING (CABG), ON PUMP, TIMES FOUR, USING LEFT INTERNAL MAMMARY ARTERY AND ENDOSCOPICALLY HARVESTED LEFT GREATER SAPHENOUS VEIN;  Surgeon: Melrose Nakayama, MD;  Location: [facility];  Service: Open Heart Surgery;  Laterality: N/A;  . ENDARTERECTOMY Right 10/12/2015   Procedure:  RIGHT CAROTID ENDARTERECTOMY WITH PATCH ANGIOPLASTY;  Surgeon: Elam Dutch, MD;  Location: [facility];  Service: Vascular;  Laterality: Right;  . INGUINAL HERNIA REPAIR Left 02/05/2013   Procedure: HERNIA REPAIR INGUINAL ADULT;  Surgeon: Joyice Faster. Cornett, MD;  Location: [facility];  Service: General;  Laterality: Left;  . INGUINAL HERNIA REPAIR Left   . INSERTION OF MESH Left 02/05/2013   Procedure: INSERTION OF MESH;  Surgeon: Joyice Faster. Cornett, MD;  Location: [facility];  Service: General;  Laterality: Left;  . LEFT HEART CATH AND CORONARY ANGIOGRAPHY N/A 06/21/2018  Procedure: LEFT HEART CATH AND CORONARY ANGIOGRAPHY;  Surgeon: Jettie Booze, MD;  Location: [facility];  Service: Cardiovascular;  Laterality: N/A;  . SHOULDER ARTHROSCOPY WITH ROTATOR CUFF REPAIR AND SUBACROMIAL DECOMPRESSION Left 05/01/2014   Procedure: LEFT ARTHROSCOPY SHOULDER SUBACROMIAL DECOMPRESSION,DISTAL CLAVICAL RESECTION AND ROTATOR CUFF REPAIR;  Surgeon: Marin Shutter, MD;  Location: [facility];  Service: Orthopedics;  Laterality: Left;  . TEE WITHOUT CARDIOVERSION N/A 06/27/2018   Procedure: TRANSESOPHAGEAL ECHOCARDIOGRAM (TEE);  Surgeon: Melrose Nakayama, MD;  Location: [facility];  Service: Open Heart Surgery;  Laterality: N/A;  . TESTICLAR CYST EXCISION Left   . TONSILLECTOMY    . VASECTOMY     Past Medical History:  Diagnosis Date  . Arthritis    DDD- Lower Back  . Bradycardia   . CAP (community acquired pneumonia) 03/17/2015   "THAT'S WHAT THEY ARE THINKING; THEY ARE NOT SURE"  . Carotid artery occlusion   . Chest pain 06/19/2018  . Chronic lower back pain    DDD  . CKD (chronic kidney disease)    Dr. Corliss Parish  . 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 Diverticulosis   . Enlarged  prostate    takes Flomax daily  . History of blood transfusion    "probably when they did aortic aneurysm"  . History of colon polyps    benign  . Hyperlipidemia   . Hypertension    takes Amlodipine and Zebeta daily  . Insomnia    takes Melatonin nightly  . Joint pain   . 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     . Muscle spasm    takes Zanaflex daily as needed  . Peripheral edema    takes Furosemide daily  . Pneumonia    hx of   . Rheumatic fever 1946  . Rheumatoid arthritis ([facility])   . Short-term memory loss    minimal  . Shortness of breath dyspnea    with exertion but lasts very short period of time  . TIA (transient ischemic attack)    x 2;takes Plavix daily  . Urinary frequency    takes Flomax daily  . Urinary urgency    BP 139/74   Pulse 61   Temp 98 F (36.7 C)   Ht 5\' 7"  (1.702 m)   Wt 162 lb (73.5 kg)   BMI 25.37 kg/m   Opioid Risk Score:   Fall Risk Score:  `1  Depression screen PHQ 2/9  Depression screen [facility] 2/9 01/09/2020 12/13/2019 11/14/2019 05/09/2019 04/16/2019 04/16/2019 08/09/2018  Decreased Interest 0 0 0 1 0 0 0  Down, Depressed, Hopeless 0 0 0 1 0 0 0  PHQ - 2 Score 0 0 0 2 0 0 0  Altered sleeping - - - 0 - - -  Tired, decreased energy - - - 3 - - -  Change in appetite - - - 1 - - -  Feeling bad or failure about yourself  - - - 1 - - -  Trouble concentrating - - - 1 - - -  Moving slowly or fidgety/restless - - - 0 - - -  PHQ-9 Score - - - 8 - - -  Some recent data might be hidden   Review of Systems  Constitutional: Negative.   HENT:  Negative.   Eyes: Negative.   Respiratory: Negative.   Cardiovascular: Negative.   Gastrointestinal: Negative.   Endocrine: Negative.   Genitourinary: Negative.   Musculoskeletal: Positive for back pain and gait problem.  Skin: Negative.   Hematological: Negative.   Psychiatric/Behavioral: Negative.   All other systems reviewed and are negative.      Objective:   Physical  Exam Vitals and nursing note reviewed.  Constitutional:      Appearance: Normal appearance.  Cardiovascular:     Rate and Rhythm: Normal rate and regular rhythm.     Pulses: Normal pulses.     Heart sounds: Normal heart sounds.  Pulmonary:     Effort: Pulmonary effort is normal.     Breath sounds: Normal breath sounds.  Musculoskeletal:     Cervical back: Normal range of motion and neck supple.     Comments: Normal Muscle Bulk and Muscle Testing Reveals:  Upper Extremities: Full ROM and Muscle Strength 5/5  Lumbar Paraspinal Tenderness: L-3-L-5  Lower Extremities: Full ROM and Muscle Strength 5/5 Arises from Table Slowly using cane for support Narrow Based Gait   Neurological:     Mental Status: He is alert and oriented to person, place, and time.  Psychiatric:        Mood and Affect: Mood normal.        Behavior: Behavior normal.           Assessment & Plan:  1. Lumbar Spondylosis/ Lumbar Stenosis:03/11/2020 Continue current medication regime. Refilled:Oxycodone 7.5/325 mg one tabletevery 8 hoursas needed #80.03/11/2020 We will continue the opioid monitoring program, this consists of regular clinic visits, examinations, urine drug screen, pill counts as well as use of [place] Controlled Substance Reporting system. A 12 month History has been reviewed on the [place] Controlled Substance Reporting System on 03/11/2020. 2.Bilateral Knees OA/ChronicBilateralKnee Pain:No complaints todat.S/PBilateral KneeInjection by DrSmithon 12/09/2019, Orthopedist Following 03/11/2020. 3.BilateralGreater Trochanter Bursitis:No complaints today. Orthopedist Following.Continue to alternate Ice and Heat Therapy.03/11/2020  F/U in 1 month  20 minutes of face to face patient care time was spent during this visit. All questions were encouraged and answered.

## 2020-03-14 ENCOUNTER — Other Ambulatory Visit: Payer: Self-pay | Admitting: Family Medicine

## 2020-03-17 NOTE — Progress Notes (Signed)
Phone [phone] In person visit   Subjective:   Kevin [name] is a 79 y.o. year old very pleasant male patient who presents for/with See problem oriented charting Chief Complaint  Patient presents with  . Insect Bite   This visit occurred during the SARS-CoV-2 public health emergency.  Safety protocols were in place, including screening questions prior to the visit, additional usage of staff PPE, and extensive cleaning of exam room while observing appropriate contact time as indicated for disinfecting solutions.   Past Medical History-  Patient Active Problem List   Diagnosis Date Noted  . Parkinson's disease ([place]) 11/18/2019    Priority: High  . Afib ([place]) 07/06/2018    Priority: High  . 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 PAD (peripheral artery disease) ([place]) 01/08/2016    Priority: High  . Diastolic CHF ([place]) 42/35/3614    Priority: High  . CKD (chronic kidney disease), stage IV ([place]) 01/27/2014    Priority: High  . Carotid artery stenosis s/p L carotid endarterectomy 01/12/2012    Priority: High  . History of CVA (cerebrovascular accident) 11/17/2011    Priority: High  . Chronic pain syndrome 12/23/2009    Priority: High  . Constipation 09/19/2017    Priority: Medium  . Personal history of colonic polyps 05/15/2013    Priority: Medium  . Essential tremor 10/05/2009    Priority: Medium  . UNSPECIFIED ANEMIA 12/05/2008    Priority: Medium  . BPH (benign prostatic hyperplasia) 06/04/2007    Priority: Medium  . Fasting hyperglycemia 02/05/2007    Priority: Medium  . Hyperlipidemia 01/03/2007    Priority: Medium  . Essential hypertension 01/03/2007    Priority: Medium  . Hemiparesis ([place]) 04/16/2019    Priority: Low  . CAP (community acquired pneumonia) 03/17/2015    Priority: Low  . Family history of malignant neoplasm of gastrointestinal tract 05/15/2013    Priority: Low  . Inguinal hernia 05/01/2012    Priority: Low  . BURSITIS, HIP 12/21/2009     Priority: Low  . ACTINIC KERATOSIS, EAR, LEFT 03/09/2009    Priority: Low  . 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 Left shoulder pain 02/04/2020  . Loss of weight 10/08/2019  . Greater trochanteric bursitis of both hips 02/26/2019  . Degenerative arthritis of knee, bilateral 01/14/2019  . SIRS (systemic inflammatory response syndrome) ([place]) 01/08/2019  . Chest pain 01/08/2019  . Atelectasis of left lung 10/30/2018  . Lumbar spondylosis 03/17/2017    Medications- reviewed and updated Current Outpatient Medications  Medication Sig Dispense Refill  . albuterol (VENTOLIN HFA) 108 (90 Base) MCG/ACT inhaler Inhale 2 puffs into the lungs every 12 (twelve) hours as needed for wheezing or shortness of breath. 1 each 6  . amLODipine (NORVASC) 10 MG tablet TAKE 1 TABLET EVERY DAY 90 tablet 1  . aspirin EC 81 MG EC tablet Take 1 tablet (81 mg total) by mouth daily.    . cilostazol (PLETAL) 50 MG tablet Take 1 tablet by mouth twice daily 60 tablet 0  . fenofibrate 160 MG tablet Take 1 tablet (160 mg total) by mouth daily. 90 tablet 3  . furosemide (LASIX) 20 MG tablet TAKE 1 TABLET EVERY DAY 90 tablet 1  . gabapentin (NEURONTIN) 400 MG capsule Take 1 capsule (400 mg total) by mouth in the morning and at bedtime. 180 capsule 3  . linaclotide (LINZESS)  145 MCG CAPS capsule Take 145 mcg by mouth as needed (for stomach).     . Melatonin 10 MG TABS Take 10 mg by mouth at bedtime.     . metoprolol tartrate (LOPRESSOR) 25 MG tablet TAKE 1 TABLET TWICE DAILY 180 tablet 3  . oxyCODONE-acetaminophen (PERCOCET) 7.5-325 MG tablet Take 1 tablet by mouth every 8 (eight) hours as needed for severe pain. 80 tablet 0  . rosuvastatin (CRESTOR) 40 MG tablet Take 1 tablet (40 mg total) by mouth daily. 90 tablet 3  . tamsulosin (FLOMAX) 0.4 MG CAPS capsule Take 1 capsule (0.4 mg total) by mouth daily. 90 capsule 3  . tiZANidine (ZANAFLEX) 4  MG tablet TAKE 1 TABLET EVERY 6 HOURS AS NEEDED FOR MUSCLE SPASM(S) 90 tablet 1   No current facility-administered medications for this visit.     Objective:  BP 124/64   Pulse (!) 57   Temp 97.8 F (36.6 C) (Temporal)   Ht 5\' 7"  (1.702 m)   Wt 165 lb 3.2 oz (74.9 kg)   SpO2 97%   BMI 25.87 kg/m  Gen: NAD, resting comfortably CV: RRR no murmurs rubs or gallops Lungs: CTAB no crackles, wheeze, rhonchi Ext: no edema Skin: warm, dry, scabbed over blister above left eyebrow-erythema that is raised throughout V1 and V2 distribution.  Photograph below.  Area is tender to touch.  No vision issues reported        Assessment and Plan   # rash on left forehead S:Patient stated that it happened last Friday. He has a place of his left eye. Some redness to his left cheek all the way up to his head. He has a scab to his left eye. His headache is across his forehead. He has used hand sanitizer on his scab.  Area itches, burns, aches-mild to moderate pain.  Of note he is on gabapentin which would help with neuropathic pain  Wife started him on valtrex 500mg  twice a day starting on Monday.   Had previously declined shingles shot as has stated never had chicken pox- we had discussed prior could still receive immunization. Could get immunization after he heals.  Immunization History  Administered Date(s) Administered  . Influenza Whole 05/30/1997  . PFIZER SARS-COV-2 Vaccination 07/26/2019, 10/08/2019  . Pneumococcal Conjugate-13 12/22/2014  . Pneumococcal Polysaccharide-23 03/28/2013  . Td 05/30/1994, 02/05/2007, 07/06/2017  A/P: Shingles in V1 and V2 distribution most likely.  Already started on Valtrex on Monday which was within 72 hours of rash but at lower dose-we will do full course of 1000 mg 3 times a day for 7 days particularly with distribution.  Concern for herpes zoster ophthalmicus-patient agrees to call ophthalmology for follow-up ASAP.  No blurry vision reported-if new or  worsening symptoms let me know immediately -Continue his gabapentin -Discussed risks with distribution for loss of vision and importance of close Ortho follow-up -Discussed potential postherpetic neuralgia -Discussed after fully heals considering vaccination  Recommended follow up: As needed for acute issue Future Appointments  Date Time Provider [place]  03/26/2020  3:30 PM Tat, Eustace Quail, DO [facility] None  04/07/2020  1:00 PM Lyndal Pulley, DO [facility] None  04/09/2020 10:40 AM Bayard Hugger, NP [facility] CPR  05/12/2020  1:00 PM Marin Olp, MD [facility]    Lab/Order associations:   ICD-10-CM   1. Herpes zoster with complication  P50.9   Listed as unspecified complication-pending ophthalmology evaluation so did not feel confident in saying no complication  Meds ordered  this encounter  Medications  . valACYclovir (VALTREX) 1000 MG tablet    Sig: Take 1 tablet (1,000 mg total) by mouth 3 (three) times daily. For 7 days for shingles.    Dispense:  21 tablet    Refill:  0   Return precautions advised.  Garret Reddish, MD

## 2020-03-17 NOTE — Patient Instructions (Addendum)
Shingles- treat with valtrex for 7 days- thrilled he had at least some in his system but we are going to need higher dose.   Because it Is close to your eye I want you to see your eye doctor ASAP this week just to be on safe side- does not appear to be affecting eye thankfully but still want their expert opinion.    Shingles  Shingles is an infection. It gives you a painful skin rash and blisters that have fluid in them. Shingles is caused by the same germ (virus) that causes chickenpox. Shingles only happens in people who:  Have had chickenpox.  Have been given a shot of medicine (vaccine) to protect against chickenpox. Shingles is rare in this group. The first symptoms of shingles may be itching, tingling, or pain in an area on your skin. A rash will show on your skin a few days or weeks later. The rash is likely to be on one side of your body. The rash usually has a shape like a belt or a band. Over time, the rash turns into fluid-filled blisters. The blisters will break open, change into scabs, and dry up. Medicines may:  Help with pain and itching.  Help you get better sooner.  Help to prevent long-term problems. Follow these instructions at home: Medicines  Take over-the-counter and prescription medicines only as told by your doctor.  Put on an anti-itch cream or numbing cream where you have a rash, blisters, or scabs. Do this as told by your doctor. Helping with itching and discomfort   Put cold, wet cloths (cold compresses) on the area of the rash or blisters as told by your doctor.  Cool baths can help you feel better. Try adding baking soda or dry oatmeal to the water to lessen itching. Do not bathe in hot water. Blister and rash care  Keep your rash covered with a loose bandage (dressing).  Wear loose clothing that does not rub on your rash.  Keep your rash and blisters clean. To do this, wash the area with mild soap and cool water as told by your doctor.  Check  your rash every day for signs of infection. Check for: ? More redness, swelling, or pain. ? Fluid or blood. ? Warmth. ? Pus or a bad smell.  Do not scratch your rash. Do not pick at your blisters. To help you to not scratch: ? Keep your fingernails clean and cut short. ? Wear gloves or mittens when you sleep, if scratching is a problem. General instructions  Rest as told by your doctor.  Keep all follow-up visits as told by your doctor. This is important.  Wash your hands often with soap and water. If soap and water are not available, use hand sanitizer. Doing this lowers your chance of getting a skin infection caused by germs (bacteria).  Your infection can cause chickenpox in people who have never had chickenpox or never got a shot of chickenpox vaccine. If you have blisters that did not change into scabs yet, try not to touch other people or be around other people, especially: ? Babies. ? Pregnant women. ? Children who have areas of red, itchy, or rough skin (eczema). ? Very old people who have transplants. ? People who have a long-term (chronic) sickness, like cancer or AIDS. Contact a doctor if:  Your pain does not get better with medicine.  Your pain does not get better after the rash heals.  You have any signs of infection  in the rash area. These signs include: ? More redness, swelling, or pain around the rash. ? Fluid or blood coming from the rash. ? The rash area feeling warm to the touch. ? Pus or a bad smell coming from the rash. Get help right away if:  The rash is on your face or nose.  You have pain in your face or pain by your eye.  You lose feeling on one side of your face.  You have trouble seeing.  You have ear pain, or you have ringing in your ear.  You have a loss of taste.  Your condition gets worse. Summary  Shingles gives you a painful skin rash and blisters that have fluid in them.  Shingles is an infection. It is caused by the same germ  (virus) that causes chickenpox.  Keep your rash covered with a loose bandage (dressing). Wear loose clothing that does not rub on your rash.  If you have blisters that did not change into scabs yet, try not to touch other people or be around people. This information is not intended to replace advice given to you by your health care provider. Make sure you discuss any questions you have with your health care provider. Document Revised: 09/07/2018 Document Reviewed: 01/18/2017 Elsevier Patient Education  2020 [employer].

## 2020-03-18 ENCOUNTER — Other Ambulatory Visit: Payer: Self-pay

## 2020-03-18 ENCOUNTER — Ambulatory Visit (INDEPENDENT_AMBULATORY_CARE_PROVIDER_SITE_OTHER): Payer: Medicare HMO | Admitting: Family Medicine

## 2020-03-18 ENCOUNTER — Encounter: Payer: Self-pay | Admitting: Family Medicine

## 2020-03-18 VITALS — BP 124/64 | HR 57 | Temp 97.8°F | Ht 67.0 in | Wt 165.2 lb

## 2020-03-18 DIAGNOSIS — B028 Zoster with other complications: Secondary | ICD-10-CM | POA: Diagnosis not present

## 2020-03-18 MED ORDER — VALACYCLOVIR HCL 1 G PO TABS: 1000.0000 mg | ORAL_TABLET | Freq: Three times a day (TID) | ORAL | 0 refills | Status: DC

## 2020-03-19 DIAGNOSIS — B029 Zoster without complications: Secondary | ICD-10-CM | POA: Diagnosis not present

## 2020-03-19 DIAGNOSIS — H5711 Ocular pain, right eye: Secondary | ICD-10-CM | POA: Diagnosis not present

## 2020-03-24 ENCOUNTER — Telehealth: Payer: Self-pay | Admitting: Neurology

## 2020-03-24 NOTE — Telephone Encounter (Signed)
Just note to document pt had appt for Thursday and cx and did not r/s.  I do see in chart he has zoster right now

## 2020-03-26 ENCOUNTER — Ambulatory Visit: Payer: Medicare HMO | Admitting: Neurology

## 2020-03-27 ENCOUNTER — Ambulatory Visit (INDEPENDENT_AMBULATORY_CARE_PROVIDER_SITE_OTHER): Payer: Medicare HMO | Admitting: Family Medicine

## 2020-03-27 ENCOUNTER — Encounter: Payer: Self-pay | Admitting: Family Medicine

## 2020-03-27 ENCOUNTER — Other Ambulatory Visit: Payer: Self-pay

## 2020-03-27 VITALS — BP 125/69 | HR 76 | Temp 98.2°F | Ht 67.0 in | Wt 162.6 lb

## 2020-03-27 DIAGNOSIS — R309 Painful micturition, unspecified: Secondary | ICD-10-CM

## 2020-03-27 LAB — POCT URINALYSIS DIPSTICK
Bilirubin, UA: NEGATIVE
Blood, UA: NEGATIVE
Glucose, UA: NEGATIVE
Ketones, UA: NEGATIVE
Nitrite, UA: POSITIVE
Protein, UA: NEGATIVE
Spec Grav, UA: 1.01 (ref 1.010–1.025)
Urobilinogen, UA: 0.2 E.U./dL
pH, UA: 7 (ref 5.0–8.0)

## 2020-03-27 MED ORDER — NITROFURANTOIN MONOHYD MACRO 100 MG PO CAPS: 100.0000 mg | ORAL_CAPSULE | Freq: Two times a day (BID) | ORAL | 0 refills | Status: DC

## 2020-03-27 NOTE — Patient Instructions (Signed)
It was nice to see you!  You have a urinary tract infection. Please start the antibiotic.  We will check a urine culture to make sure you do not have a resistant bacteria. We will call you if we need to change your medications.   Please make sure you are drinking plenty of fluids over the next few days.  If your symptoms do not improve over the next 5-7 days, or if they worsen, please let us know. Please also let us know if you have worsening back pain, fevers, chills, or body aches.   Take care, Dr Rahma Meller  

## 2020-03-27 NOTE — Progress Notes (Signed)
   Kevin [name] is a 79 y.o. male who presents today for an office visit.  Assessment/Plan:  UTI UA consistent with UTI.  No red flags or signs of systemic illness.  Will empirically start Macrobid 100 mg twice daily x7 days.  Will check urine culture.  Encourage good oral hydration.  Discussed reasons to return to care.     Subjective:  HPI:  Patient here with UTI symptoms for the past day.  Includes dysuria, malodorous urine, and frequency.  No specific treatments tried.  No fevers or chills.  No flank pain.  No constipation or diarrhea.       Objective:  Physical Exam: BP 125/69   Pulse 76   Temp 98.2 F (36.8 C) (Temporal)   Ht 5\' 7"  (1.702 m)   Wt 162 lb 9.6 oz (73.8 kg)   SpO2 96%   BMI 25.47 kg/m   Gen: No acute distress, resting comfortably CV: Regular rate and rhythm with no murmurs appreciated Pulm: Normal work of breathing, clear to auscultation bilaterally with no crackles, wheezes, or rhonchi Neuro: Grossly normal, moves all extremities Psych: Normal affect and thought content      Kevin [name] M. Jerline Pain, MD 03/27/2020 11:52 AM

## 2020-03-29 LAB — URINE CULTURE
MICRO NUMBER:: 11136625
SPECIMEN QUALITY:: ADEQUATE

## 2020-03-30 ENCOUNTER — Other Ambulatory Visit: Payer: Self-pay

## 2020-03-30 MED ORDER — CEPHALEXIN 500 MG PO CAPS: 500.0000 mg | ORAL_CAPSULE | Freq: Four times a day (QID) | ORAL | 0 refills | Status: AC

## 2020-03-30 NOTE — Progress Notes (Signed)
Please inform patient of the following:  UTI is confirmed but the antibiotic we have him on will not treat it fully. Recommend switching to keflex 500mg  qid x 7 days.  Kevin [name]. Jerline Pain, MD 03/30/2020 10:47 AM

## 2020-04-06 NOTE — Progress Notes (Signed)
[facility] [place] Phone: [phone] Subjective:   Kevin [name], am serving as a scribe for Dr. Hulan Saas. This visit occurred during the SARS-CoV-2 public health emergency.  Safety protocols were in place, including screening questions prior to the visit, additional usage of staff PPE, and extensive cleaning of exam room while observing appropriate contact time as indicated for disinfecting solutions.   I'm seeing this patient by the request  of:  Marin Olp, MD  CC: Shoulder pain and multiple other complaints  OVF:[id number]   02/04/2020 I believe that some of it could be secondary to more of the tremor that patient does have but is either secondary to more of the Parkinson's or the essential tremor but difficult to say.  We discussed home exercise and icing regimen, which activities to do which wants to avoid.  Discussed increasing activity slowly.  Follow-up again in 4 to 8 weeks  Injection given today, tolerated the procedure well, discussed icing regimen and home exercises, increase activity slowly.  Discussed icing regimen.  Follow-up again in 4 to 8 weeks  Update 04/07/2020 Kevin [name] is a 79 y.o. male coming in with complaint of bilateral knee, hip and shoulder pain. Patient states that his lower back pain has increased since last visit. Having right hip pain pain over greater trochanter. Injections for knees last visit did provide relief.    Patient did have the shoulder x-ray done on September 7 that was independently visualized by me showing very minimal arthritic changes.  Past Medical History:  Diagnosis Date  . Arthritis    DDD- Lower Back  . Bradycardia   . CAP (community acquired pneumonia) 03/17/2015   "THAT'S WHAT THEY ARE THINKING; THEY ARE NOT SURE"  . Carotid artery occlusion   . Chest pain 06/19/2018  . Chronic lower back pain    DDD  . CKD (chronic kidney disease)    Dr. Corliss Parish  . CKD (chronic kidney disease), stage III ([place]) 01/27/2014   GFR just above 30--> just below 30 11/10/14 refer to nephrology Creatinine 2 baseline but went up to 2.5 peak Likely RAS- lisinopril not great choice   . Diverticulosis   . Enlarged prostate    takes Flomax daily  . History of blood transfusion    "probably when they did aortic aneurysm"  . History of colon polyps    benign  . Hyperlipidemia   . Hypertension    takes Amlodipine and Zebeta daily  . Insomnia    takes Melatonin nightly  . Joint pain   . 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     . Muscle spasm    takes Zanaflex daily as needed  . Peripheral edema    takes Furosemide daily  . Pneumonia    hx of   . Rheumatic fever 1946  . Rheumatoid arthritis ([place])   . Short-term memory loss    minimal  . Shortness of breath dyspnea    with exertion but lasts very short period of time  . TIA (transient ischemic attack)    x 2;takes Plavix daily  . Urinary frequency    takes Flomax daily  . Urinary urgency    Past Surgical History:  Procedure Laterality Date  . ABDOMINAL AORTIC ANEURYSM REPAIR  2010  . CAROTID ENDARTERECTOMY Left 09/20/2004  . CATARACT EXTRACTION, BILATERAL     2016-2017  . COLONOSCOPY    . 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 ENDARTERECTOMY Right 10/12/2015   Procedure: RIGHT CAROTID ENDARTERECTOMY WITH PATCH ANGIOPLASTY;  Surgeon: Elam Dutch, MD;  Location: [facility];  Service: Vascular;  Laterality: Right;  . INGUINAL HERNIA REPAIR Left 02/05/2013   Procedure: HERNIA REPAIR INGUINAL ADULT;  Surgeon: Joyice Faster. Cornett, MD;  Location: [facility];  Service: General;   Laterality: Left;  . INGUINAL HERNIA REPAIR Left   . INSERTION OF MESH Left 02/05/2013   Procedure: INSERTION OF MESH;  Surgeon: Joyice Faster. Cornett, MD;  Location: [facility];  Service: General;  Laterality: Left;  . LEFT HEART CATH AND CORONARY ANGIOGRAPHY N/A 06/21/2018   Procedure: LEFT HEART CATH AND CORONARY ANGIOGRAPHY;  Surgeon: Jettie Booze, MD;  Location: [facility];  Service: Cardiovascular;  Laterality: N/A;  . SHOULDER ARTHROSCOPY WITH ROTATOR CUFF REPAIR AND SUBACROMIAL DECOMPRESSION Left 05/01/2014   Procedure: LEFT ARTHROSCOPY SHOULDER SUBACROMIAL DECOMPRESSION,DISTAL CLAVICAL RESECTION AND ROTATOR CUFF REPAIR;  Surgeon: Marin Shutter, MD;  Location: [facility];  Service: Orthopedics;  Laterality: Left;  . TEE WITHOUT CARDIOVERSION N/A 06/27/2018   Procedure: TRANSESOPHAGEAL ECHOCARDIOGRAM (TEE);  Surgeon: Melrose Nakayama, MD;  Location: [facility];  Service: Open Heart Surgery;  Laterality: N/A;  . TESTICLAR CYST EXCISION Left   . TONSILLECTOMY    . VASECTOMY     Social History   Socioeconomic History  . Marital status: Married    Spouse name: Not on file  . Number of children: Not on file  . Years of education: Not on file  . Highest education level: Not on file  Occupational History  . Occupation: retired    Comment: maintenance; electrician  Tobacco Use  . Smoking status: Former Smoker    Packs/day: 2.00    Years: 28.00    Pack years: 56.00    Types: Cigarettes    Quit date: 05/31/1987    Years since quitting: 32.8  . Smokeless tobacco: Never Used  . Tobacco comment: quit smoking "in my 40's"  Vaping Use  . Vaping Use: Never used  Substance and Sexual Activity  . Alcohol use: Not Currently    Alcohol/week: 0.0 standard drinks    Comment: rare, mikes hard lemonade  . Drug use: Not Currently    Types: Marijuana    Comment: hx of-4-5 yrs ago  . Sexual activity: Not Currently  Other Topics Concern  . Not on file  Social History  Narrative   Married 33 years in 2015. Lived together for 12 years. 2 boys from first wife. 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       Hobbies: yardwork, [place] barn, woodwork, collect knive   Right handed   One story home   Caffeine yes   Social Determinants of Health   Financial Resource Strain:   . Difficulty of Paying Living Expenses: Not on file  Food Insecurity:   . Worried About Charity fundraiser in the Last Year: Not on file  . Ran Out of Food in the Last Year: Not on file  Transportation Needs:   .  Lack of Transportation (Medical): Not on file  . Lack of Transportation (Non-Medical): Not on file  Physical Activity:   . Days of Exercise per Week: Not on file  . Minutes of Exercise per Session: Not on file  Stress:   . Feeling of Stress : Not on file  Social Connections:   . Frequency of Communication with Friends and Family: Not on file  . Frequency of Social Gatherings with Friends and Family: Not on file  . Attends Religious Services: Not on file  . Active Member of Clubs or Organizations: Not on file  . Attends Archivist Meetings: Not on file  . Marital Status: Not on file   Allergies  Allergen Reactions  . Hydralazine Other (See Comments)    Pt stated he feels drunk  . Mirtazapine Other (See Comments)    Very tired, feeling drunk, loopy  . Sinemet [Carbidopa-Levodopa] Other (See Comments)    GI symptoms    Family History  Problem Relation Age of Onset  . Parkinsonism Father   . Dementia Father   . Cancer Father        Throat  . Colon cancer Mother        died in 46s  . Cancer Mother        Colon     Current Outpatient Medications (Cardiovascular):  .  amLODipine (NORVASC) 10 MG tablet, TAKE 1 TABLET EVERY DAY .  fenofibrate 160 MG tablet, Take 1 tablet (160 mg total) by mouth daily. .  furosemide (LASIX) 20 MG tablet,  TAKE 1 TABLET EVERY DAY .  metoprolol tartrate (LOPRESSOR) 25 MG tablet, TAKE 1 TABLET TWICE DAILY .  rosuvastatin (CRESTOR) 40 MG tablet, Take 1 tablet (40 mg total) by mouth daily.  Current Outpatient Medications (Respiratory):  .  albuterol (VENTOLIN HFA) 108 (90 Base) MCG/ACT inhaler, Inhale 2 puffs into the lungs every 12 (twelve) hours as needed for wheezing or shortness of breath.  Current Outpatient Medications (Analgesics):  .  aspirin EC 81 MG EC tablet, Take 1 tablet (81 mg total) by mouth daily. Marland Kitchen  oxyCODONE-acetaminophen (PERCOCET) 7.5-325 MG tablet, Take 1 tablet by mouth every 8 (eight) hours as needed for severe pain.  Current Outpatient Medications (Hematological):  .  cilostazol (PLETAL) 50 MG tablet, Take 1 tablet by mouth twice daily  Current Outpatient Medications (Other):  .  gabapentin (NEURONTIN) 400 MG capsule, Take 1 capsule (400 mg total) by mouth in the morning and at bedtime. Marland Kitchen  linaclotide (LINZESS) 145 MCG CAPS capsule, Take 145 mcg by mouth as needed (for stomach).  .  Melatonin 10 MG TABS, Take 10 mg by mouth at bedtime.  .  nitrofurantoin, macrocrystal-monohydrate, (MACROBID) 100 MG capsule, Take 1 capsule (100 mg total) by mouth 2 (two) times daily. .  tamsulosin (FLOMAX) 0.4 MG CAPS capsule, Take 1 capsule (0.4 mg total) by mouth daily. Marland Kitchen  tiZANidine (ZANAFLEX) 4 MG tablet, TAKE 1 TABLET EVERY 6 HOURS AS NEEDED FOR MUSCLE SPASM(S) .  valACYclovir (VALTREX) 1000 MG tablet, Take 1 tablet (1,000 mg total) by mouth 3 (three) times daily. For 7 days for shingles.   Reviewed prior external information including notes and imaging from  primary care provider As well as notes that were available from care everywhere and other healthcare systems.  Past medical history, social, surgical and family history all reviewed in electronic medical record.  [name] pertanent information unless stated regarding to the chief complaint.   Review of Systems:  [name] headache,  visual  changes, nausea, vomiting, diarrhea, constipation, dizziness, abdominal pain, skin rash, fevers, chills, night sweats, weight loss, swollen lymph nodes, joint swelling, chest pain, shortness of breath, mood changes. POSITIVE muscle aches, body aches  Objective  Blood pressure (!) 142/82, pulse 69, height 5\' 7"  (1.702 m), weight 164 lb (74.4 kg), SpO2 98 %.   General: [name] apparent distress alert and oriented x3 mood and affect normal, dressed appropriately.  Mild masked facies.  Patient does have a resting tremor of the right upper and lower extremity noted. HEENT: Pupils equal, extraocular movements intact  Respiratory: Patient's speak in full sentences and does not appear short of breath  Cardiovascular: Trace lower extremity edema, non tender, [name] erythema  Gait antalgic MSK: Patient's low back has loss of lordosis.  Patient does have some hypertonicity noted.  Significant tightness with straight leg test bilaterally.  Mild atrophy of the legs bilaterally.  Tender to palpation diffusely in the lumbar spine.    Impression and Recommendations:     The above documentation has been reviewed and is accurate and complete Lyndal Pulley, DO

## 2020-04-07 ENCOUNTER — Ambulatory Visit: Payer: Medicare HMO | Admitting: Family Medicine

## 2020-04-07 ENCOUNTER — Encounter: Payer: Self-pay | Admitting: Family Medicine

## 2020-04-07 ENCOUNTER — Other Ambulatory Visit: Payer: Self-pay

## 2020-04-07 VITALS — BP 142/82 | HR 69 | Ht 67.0 in | Wt 164.0 lb

## 2020-04-07 DIAGNOSIS — M47816 Spondylosis without myelopathy or radiculopathy, lumbar region: Secondary | ICD-10-CM

## 2020-04-07 DIAGNOSIS — M545 Low back pain, unspecified: Secondary | ICD-10-CM | POA: Diagnosis not present

## 2020-04-07 DIAGNOSIS — G20 Parkinson's disease: Secondary | ICD-10-CM | POA: Diagnosis not present

## 2020-04-07 DIAGNOSIS — M4807 Spinal stenosis, lumbosacral region: Secondary | ICD-10-CM

## 2020-04-07 NOTE — Assessment & Plan Note (Signed)
Patient's resting tremor of the upper extremity and lower extremity on the right side seems to be somewhat worse.  We will continue to monitor.

## 2020-04-07 NOTE — Assessment & Plan Note (Signed)
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  Patient does have also moderate right foraminal narrowing.  Patient's MRI is greater than 81 years old.  Patient at this point is on significant amount of oxycodone from pain management.  We have him on gabapentin but any increasing in dose causes more difficulty.  I do believe that a new MRI could be beneficial and then we can consider potentially epidurals.  Patient as well as wife is in agreement with the plan.  We will discuss after imaging.

## 2020-04-07 NOTE — Patient Instructions (Addendum)
MRI lumbar spine [phone] We will be in touch with results

## 2020-04-09 ENCOUNTER — Encounter: Payer: Self-pay | Admitting: Registered Nurse

## 2020-04-09 ENCOUNTER — Encounter: Payer: Medicare HMO | Attending: Physical Medicine & Rehabilitation | Admitting: Registered Nurse

## 2020-04-09 ENCOUNTER — Other Ambulatory Visit: Payer: Self-pay

## 2020-04-09 VITALS — BP 142/75 | HR 59 | Temp 98.0°F | Ht 67.0 in | Wt 165.4 lb

## 2020-04-09 DIAGNOSIS — Z5181 Encounter for therapeutic drug level monitoring: Secondary | ICD-10-CM | POA: Diagnosis not present

## 2020-04-09 DIAGNOSIS — M47816 Spondylosis without myelopathy or radiculopathy, lumbar region: Secondary | ICD-10-CM | POA: Diagnosis not present

## 2020-04-09 DIAGNOSIS — Z79891 Long term (current) use of opiate analgesic: Secondary | ICD-10-CM | POA: Diagnosis not present

## 2020-04-09 DIAGNOSIS — G894 Chronic pain syndrome: Secondary | ICD-10-CM

## 2020-04-09 DIAGNOSIS — M5416 Radiculopathy, lumbar region: Secondary | ICD-10-CM | POA: Insufficient documentation

## 2020-04-09 MED ORDER — OXYCODONE-ACETAMINOPHEN 7.5-325 MG PO TABS: 1.0000 | ORAL_TABLET | Freq: Three times a day (TID) | ORAL | 0 refills | Status: DC | PRN

## 2020-04-09 NOTE — Progress Notes (Signed)
Subjective:    Patient ID: Kevin [name], male    DOB: November 28, 1940, 79 y.o.   MRN: [id number]  HPI: Kevin [name] is a 79 y.o. male who returns for follow up appointment for chronic pain and medication refill. He states his pain is located in his lower back pain radiating into his left lower extremity occasionally. He rates his pain 8. His current exercise regime is walking short distances with his cane.    Kevin [name] was seen by Dr Hulan Saas on 04/07/2020, he's scheduled for MRI on 04/26/2020.  [name] equivalent is 30.00 MME.  Oral Swab was Performed today.   Kevin [name] in room.    Pain Inventory Average Pain 8 Pain Right Now 8 My pain is constant, sharp, burning, stabbing, tingling and aching  In the last 24 hours, has pain interfered with the following? General activity 8 Relation with others 9 Enjoyment of life 10 What TIME of day is your pain at its worst? morning , daytime, evening and night Sleep (in general) poor to fair  Pain is worse with: walking, bending, sitting, standing and some activites Pain improves with: rest and heat Relief from Meds: 4  Family History  Problem Relation Age of Onset  . Parkinsonism Father   . Dementia Father   . Cancer Father        Throat  . Colon cancer Mother        died in 18s  . Cancer Mother        Colon   Social History   Socioeconomic History  . Marital status: Married    Spouse name: Not on file  . Number of children: Not on file  . Years of education: Not on file  . Highest education level: Not on file  Occupational History  . Occupation: retired    Comment: maintenance; electrician  Tobacco Use  . Smoking status: Former Smoker    Packs/day: 2.00    Years: 28.00    Pack years: 56.00    Types: Cigarettes    Quit date: 05/31/1987    Years since quitting: 32.8  . Smokeless tobacco: Never Used  . Tobacco comment: quit smoking "in my 40's"  Vaping Use  . Vaping Use: Never used  Substance and Sexual  Activity  . Alcohol use: Not Currently    Alcohol/week: 0.0 standard drinks    Comment: rare, mikes hard lemonade  . Drug use: Not Currently    Types: Marijuana    Comment: hx of-4-5 yrs ago  . Sexual activity: Not Currently  Other Topics Concern  . Not on file  Social History Narrative   Married 33 years in 2015. Lived together for 12 years. 2 boys from first wife. 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       Hobbies: yardwork, [place] barn, woodwork, collect knive   Right handed   One story home   Caffeine yes   Social Determinants of Health   Financial Resource Strain:   . Difficulty of Paying Living Expenses: Not on file  Food Insecurity:   . Worried About Charity fundraiser in the Last Year: Not on file  . Ran Out of Food in the Last Year: Not on file  Transportation Needs:   . Lack of Transportation (Medical): Not on file  . Lack  of Transportation (Non-Medical): Not on file  Physical Activity:   . Days of Exercise per Week: Not on file  . Minutes of Exercise per Session: Not on file  Stress:   . Feeling of Stress : Not on file  Social Connections:   . Frequency of Communication with Friends and Family: Not on file  . Frequency of Social Gatherings with Friends and Family: Not on file  . Attends Religious Services: Not on file  . Active Member of Clubs or Organizations: Not on file  . Attends Archivist Meetings: Not on file  . Marital Status: Not on file   Past Surgical History:  Procedure Laterality Date  . ABDOMINAL AORTIC ANEURYSM REPAIR  2010  . CAROTID ENDARTERECTOMY Left 09/20/2004  . CATARACT EXTRACTION, BILATERAL     2016-2017  . COLONOSCOPY    . CORONARY ARTERY BYPASS GRAFT N/A 06/27/2018   Procedure: CORONARY ARTERY BYPASS GRAFTING (CABG), ON PUMP, TIMES FOUR, USING LEFT INTERNAL MAMMARY ARTERY AND ENDOSCOPICALLY HARVESTED  LEFT GREATER SAPHENOUS VEIN;  Surgeon: Melrose Nakayama, MD;  Location: [place];  Service: Open Heart Surgery;  Laterality: N/A;  . ENDARTERECTOMY Right 10/12/2015   Procedure: RIGHT CAROTID ENDARTERECTOMY WITH PATCH ANGIOPLASTY;  Surgeon: Elam Dutch, MD;  Location: [facility];  Service: Vascular;  Laterality: Right;  . INGUINAL HERNIA REPAIR Left 02/05/2013   Procedure: HERNIA REPAIR INGUINAL ADULT;  Surgeon: Joyice Faster. Cornett, MD;  Location: [place];  Service: General;  Laterality: Left;  . INGUINAL HERNIA REPAIR Left   . INSERTION OF MESH Left 02/05/2013   Procedure: INSERTION OF MESH;  Surgeon: Joyice Faster. Cornett, MD;  Location: [place];  Service: General;  Laterality: Left;  . LEFT HEART CATH AND CORONARY ANGIOGRAPHY N/A 06/21/2018   Procedure: LEFT HEART CATH AND CORONARY ANGIOGRAPHY;  Surgeon: Jettie Booze, MD;  Location: [facility];  Service: Cardiovascular;  Laterality: N/A;  . SHOULDER ARTHROSCOPY WITH ROTATOR CUFF REPAIR AND SUBACROMIAL DECOMPRESSION Left 05/01/2014   Procedure: LEFT ARTHROSCOPY SHOULDER SUBACROMIAL DECOMPRESSION,DISTAL CLAVICAL RESECTION AND ROTATOR CUFF REPAIR;  Surgeon: Marin Shutter, MD;  Location: [facility];  Service: Orthopedics;  Laterality: Left;  . TEE WITHOUT CARDIOVERSION N/A 06/27/2018   Procedure: TRANSESOPHAGEAL ECHOCARDIOGRAM (TEE);  Surgeon: Melrose Nakayama, MD;  Location: [facility];  Service: Open Heart Surgery;  Laterality: N/A;  . TESTICLAR CYST EXCISION Left   . TONSILLECTOMY    . VASECTOMY     Past Surgical History:  Procedure Laterality Date  . ABDOMINAL AORTIC ANEURYSM REPAIR  2010  . CAROTID ENDARTERECTOMY Left 09/20/2004  . CATARACT EXTRACTION, BILATERAL     2016-2017  . COLONOSCOPY    . CORONARY ARTERY BYPASS GRAFT N/A 06/27/2018   Procedure: CORONARY ARTERY BYPASS GRAFTING (CABG), ON PUMP, TIMES FOUR, USING LEFT INTERNAL MAMMARY ARTERY AND ENDOSCOPICALLY HARVESTED LEFT GREATER SAPHENOUS VEIN;   Surgeon: Melrose Nakayama, MD;  Location: [facility] [place] [facility];  Service: Open Heart Surgery;  Laterality: N/A;  . ENDARTERECTOMY Right 10/12/2015   Procedure: RIGHT CAROTID ENDARTERECTOMY WITH PATCH ANGIOPLASTY;  Surgeon: Elam Dutch, MD;  Location: [facility];  Service: Vascular;  Laterality: Right;  . INGUINAL HERNIA REPAIR Left 02/05/2013   Procedure: HERNIA REPAIR INGUINAL ADULT;  Surgeon: Joyice Faster. Cornett, MD;  Location: [facility];  Service: General;  Laterality: Left;  . INGUINAL HERNIA REPAIR Left   . INSERTION OF MESH Left 02/05/2013   Procedure: INSERTION OF MESH;  Surgeon: Joyice Faster. Cornett,  MD;  Location: [facility];  Service: General;  Laterality: Left;  . LEFT HEART CATH AND CORONARY ANGIOGRAPHY N/A 06/21/2018   Procedure: LEFT HEART CATH AND CORONARY ANGIOGRAPHY;  Surgeon: Jettie Booze, MD;  Location: [facility];  Service: Cardiovascular;  Laterality: N/A;  . SHOULDER ARTHROSCOPY WITH ROTATOR CUFF REPAIR AND SUBACROMIAL DECOMPRESSION Left 05/01/2014   Procedure: LEFT ARTHROSCOPY SHOULDER SUBACROMIAL DECOMPRESSION,DISTAL CLAVICAL RESECTION AND ROTATOR CUFF REPAIR;  Surgeon: Marin Shutter, MD;  Location: [place] [facility];  Service: Orthopedics;  Laterality: Left;  . TEE WITHOUT CARDIOVERSION N/A 06/27/2018   Procedure: TRANSESOPHAGEAL ECHOCARDIOGRAM (TEE);  Surgeon: Melrose Nakayama, MD;  Location: [facility];  Service: Open Heart Surgery;  Laterality: N/A;  . TESTICLAR CYST EXCISION Left   . TONSILLECTOMY    . VASECTOMY     Past Medical History:  Diagnosis Date  . Arthritis    DDD- Lower Back  . Bradycardia   . CAP (community acquired pneumonia) 03/17/2015   "THAT'S WHAT THEY ARE THINKING; THEY ARE NOT SURE"  . Carotid artery occlusion   . Chest pain 06/19/2018  . Chronic lower back pain    DDD  . CKD (chronic kidney disease)    Dr. Corliss Parish  . CKD (chronic kidney disease), stage III ([place]) 01/27/2014   GFR just above 30--> just below 30  11/10/14 refer to nephrology Creatinine 2 baseline but went up to 2.5 peak Likely RAS- lisinopril not great choice   . Diverticulosis   . Enlarged prostate    takes Flomax daily  . History of blood transfusion    "probably when they did aortic aneurysm"  . History of colon polyps    benign  . Hyperlipidemia   . Hypertension    takes Amlodipine and Zebeta daily  . Insomnia    takes Melatonin nightly  . Joint pain   . 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     . Muscle spasm    takes Zanaflex daily as needed  . Peripheral edema    takes Furosemide daily  . Pneumonia    hx of   . Rheumatic fever 1946  . Rheumatoid arthritis ([place])   . Short-term memory loss    minimal  . Shortness of breath dyspnea    with exertion but lasts very short period of time  . TIA (transient ischemic attack)    x 2;takes Plavix daily  . Urinary frequency    takes Flomax daily  . Urinary urgency    BP (!) 142/75   Pulse (!) 59   Temp 98 F (36.7 C)   Ht 5\' 7"  (1.702 m)   Wt 165 lb 6.4 oz (75 kg)   SpO2 98%   BMI 25.91 kg/m   Opioid Risk Score:   Fall Risk Score:  `1  Depression screen PHQ 2/9  Depression screen [facility] 2/9 03/11/2020 01/09/2020 12/13/2019 11/14/2019 05/09/2019 04/16/2019 04/16/2019  Decreased Interest 0 0 0 0 1 0 0  Down, Depressed, Hopeless 0 0 0 0 1 0 0  PHQ - 2 Score 0 0 0 0 2 0 0  Altered sleeping - - - - 0 - -  Tired, decreased energy - - - - 3 - -  Change in appetite - - - - 1 - -  Feeling bad or failure about yourself  - - - - 1 - -  Trouble concentrating - - - - 1 - -  Moving slowly or fidgety/restless - - - -  0 - -  PHQ-9 Score - - - - 8 - -  Some recent data might be hidden   Review of Systems  Musculoskeletal: Positive for back pain and gait problem.  All other systems reviewed and are negative.      Objective:   Physical Exam Vitals and nursing note reviewed.  Constitutional:      Appearance: Normal appearance.  Cardiovascular:      Rate and Rhythm: Normal rate and regular rhythm.     Pulses: Normal pulses.     Heart sounds: Normal heart sounds.  Pulmonary:     Effort: Pulmonary effort is normal.     Breath sounds: Normal breath sounds.  Musculoskeletal:     Cervical back: Normal range of motion and neck supple.     Comments: Normal Muscle Bulk and Muscle Testing Reveals:  Upper Extremities:Full  ROM and Muscle Strength 5/5 Lumbar Paraspinal Tenderness: L-3-L-5  Lower Extremities: Full ROM and Muscle Strength 5/5 Arises from Table Slowly using cane for support Narrow Based  Gait   Skin:    General: Skin is warm and dry.  Neurological:     Mental Status: He is alert and oriented to person, place, and time.  Psychiatric:        Mood and Affect: Mood normal.        Behavior: Behavior normal.           Assessment & Plan:  1. Lumbar Spondylosis/ Lumbar Stenosis:04/09/2020 Continue current medication regime. Refilled:Oxycodone 7.5/325 mg one tabletevery 8 hoursas needed #80.04/09/2020 We will continue the opioid monitoring program, this consists of regular clinic visits, examinations, urine drug screen, pill counts as well as use of [place] Controlled Substance Reporting system. A 12 month History has been reviewed on the [place] Controlled Substance Reporting Systemon 04/09/2020. 2.Bilateral Knees OA/ChronicBilateralKnee Pain:No complaints todat.S/PBilateral KneeInjection by DrSmithon 12/09/2019, Orthopedist Following 04/09/2020. 3.BilateralGreater Trochanter Bursitis:No complaints today.Orthopedist Following.Continue to alternate Ice and Heat Therapy.04/09/2020  F/U in 1 month  20 minutes of face to face patient care time was spent during this visit. All questions were encouraged and answered.

## 2020-04-13 ENCOUNTER — Telehealth: Payer: Self-pay

## 2020-04-13 ENCOUNTER — Other Ambulatory Visit: Payer: Self-pay

## 2020-04-13 MED ORDER — CEPHALEXIN 500 MG PO CAPS: 500.0000 mg | ORAL_CAPSULE | Freq: Four times a day (QID) | ORAL | 0 refills | Status: DC

## 2020-04-13 NOTE — Telephone Encounter (Signed)
Keflex sent in per Dr. Jerline Pain note on 03/30/20.

## 2020-04-13 NOTE — Telephone Encounter (Signed)
Patient was seen a 03/26/20 and was prescribed a medication for UTI symptoms patients has taken all of medication and patient states UTI symptoms are back in full force and would another round of medication. Patient also states there is no way he can come in to be seen and would just like medication.

## 2020-04-14 LAB — DRUG TOX MONITOR 1 W/CONF, ORAL FLD
Alprazolam: NEGATIVE ng/mL (ref <0.50)
Amphetamines: NEGATIVE ng/mL (ref <10)
Barbiturates: NEGATIVE ng/mL (ref <10)
Benzodiazepines: POSITIVE ng/mL — AB (ref <0.50)
Buprenorphine: NEGATIVE ng/mL (ref <0.10)
Chlordiazepoxide: NEGATIVE ng/mL (ref <0.50)
Clonazepam: NEGATIVE ng/mL (ref <0.50)
Cocaine: NEGATIVE ng/mL (ref <5.0)
Codeine: NEGATIVE ng/mL (ref <2.5)
Diazepam: NEGATIVE ng/mL (ref <0.50)
Dihydrocodeine: NEGATIVE ng/mL (ref <2.5)
Fentanyl: NEGATIVE ng/mL (ref <0.10)
Flunitrazepam: NEGATIVE ng/mL (ref <0.50)
Flurazepam: NEGATIVE ng/mL (ref <0.50)
Heroin Metabolite: NEGATIVE ng/mL (ref <1.0)
Hydrocodone: NEGATIVE ng/mL (ref <2.5)
Hydromorphone: NEGATIVE ng/mL (ref <2.5)
Lorazepam: NEGATIVE ng/mL (ref <0.50)
MARIJUANA: NEGATIVE ng/mL (ref <2.5)
MDMA: NEGATIVE ng/mL (ref <10)
Meprobamate: NEGATIVE ng/mL (ref <2.5)
Methadone: NEGATIVE ng/mL (ref <5.0)
Midazolam: NEGATIVE ng/mL (ref <0.50)
Morphine: NEGATIVE ng/mL (ref <2.5)
Nicotine Metabolite: NEGATIVE ng/mL (ref <5.0)
Nordiazepam: 0.65 ng/mL — ABNORMAL HIGH (ref <0.50)
Norhydrocodone: NEGATIVE ng/mL (ref <2.5)
Noroxycodone: 10.5 ng/mL — ABNORMAL HIGH (ref <2.5)
Opiates: POSITIVE ng/mL — AB (ref <2.5)
Oxazepam: NEGATIVE ng/mL (ref <0.50)
Oxycodone: 59.8 ng/mL — ABNORMAL HIGH (ref <2.5)
Oxymorphone: NEGATIVE ng/mL (ref <2.5)
Phencyclidine: NEGATIVE ng/mL (ref <10)
Tapentadol: NEGATIVE ng/mL (ref <5.0)
Temazepam: NEGATIVE ng/mL (ref <0.50)
Tramadol: NEGATIVE ng/mL (ref <5.0)
Triazolam: NEGATIVE ng/mL (ref <0.50)
Zolpidem: NEGATIVE ng/mL (ref <5.0)

## 2020-04-14 LAB — DRUG TOX ALC METAB W/CON, ORAL FLD: Alcohol Metabolite: NEGATIVE ng/mL (ref <25)

## 2020-04-16 ENCOUNTER — Telehealth: Payer: Self-pay | Admitting: *Deleted

## 2020-04-16 NOTE — Telephone Encounter (Signed)
Oral swab drug screen was consistent for prescribed medications.  ?

## 2020-04-19 ENCOUNTER — Other Ambulatory Visit: Payer: Self-pay | Admitting: Family Medicine

## 2020-04-26 ENCOUNTER — Other Ambulatory Visit: Payer: Self-pay

## 2020-04-26 ENCOUNTER — Ambulatory Visit
Admission: RE | Admit: 2020-04-26 | Discharge: 2020-04-26 | Disposition: A | Discharge status: Home / Self Care | Payer: Medicare HMO | Source: Ambulatory Visit | Attending: Family Medicine | Admitting: Family Medicine

## 2020-04-26 DIAGNOSIS — M48061 Spinal stenosis, lumbar region without neurogenic claudication: Secondary | ICD-10-CM | POA: Diagnosis not present

## 2020-04-26 DIAGNOSIS — M4807 Spinal stenosis, lumbosacral region: Secondary | ICD-10-CM

## 2020-04-26 DIAGNOSIS — M545 Low back pain, unspecified: Secondary | ICD-10-CM | POA: Diagnosis not present

## 2020-04-27 ENCOUNTER — Other Ambulatory Visit: Payer: Self-pay

## 2020-04-27 ENCOUNTER — Other Ambulatory Visit: Payer: Self-pay | Admitting: Family Medicine

## 2020-04-27 ENCOUNTER — Telehealth: Payer: Self-pay | Admitting: Family Medicine

## 2020-04-27 DIAGNOSIS — M5416 Radiculopathy, lumbar region: Secondary | ICD-10-CM

## 2020-04-27 NOTE — Telephone Encounter (Signed)
Order placed and patient notified to call [facility].

## 2020-04-27 NOTE — Telephone Encounter (Signed)
Patient wife called, given MRI results. Pt would like to proceed with epidural as soon as possible, his pain is worse and he is barely able to move around.

## 2020-04-30 ENCOUNTER — Telehealth: Payer: Self-pay | Admitting: Family Medicine

## 2020-04-30 ENCOUNTER — Inpatient Hospital Stay: Admission: RE | Admit: 2020-04-30 | Payer: Medicare HMO | Source: Ambulatory Visit

## 2020-04-30 NOTE — Telephone Encounter (Signed)
Pt called, [facility] will not schedule his epidural until we confirm he is OFF Pletal. Pt stated [facility] should have faxed [place] something to confirm this. He took his last half dose AM 12/1 and will not take anymore until he gets through the epidural.

## 2020-04-30 NOTE — Telephone Encounter (Signed)
Spoke with patient. Resent fax.

## 2020-05-01 NOTE — Discharge Instructions (Signed)

## 2020-05-04 ENCOUNTER — Inpatient Hospital Stay: Admission: RE | Admit: 2020-05-04 | Payer: Medicare HMO | Source: Ambulatory Visit

## 2020-05-04 ENCOUNTER — Ambulatory Visit
Admission: RE | Admit: 2020-05-04 | Discharge: 2020-05-04 | Disposition: A | Discharge status: Home / Self Care | Payer: Medicare HMO | Source: Ambulatory Visit | Attending: Family Medicine | Admitting: Family Medicine

## 2020-05-04 ENCOUNTER — Other Ambulatory Visit: Payer: Self-pay

## 2020-05-04 DIAGNOSIS — M5416 Radiculopathy, lumbar region: Secondary | ICD-10-CM

## 2020-05-04 DIAGNOSIS — M48061 Spinal stenosis, lumbar region without neurogenic claudication: Secondary | ICD-10-CM | POA: Diagnosis not present

## 2020-05-04 MED ORDER — IOPAMIDOL (ISOVUE-M 200) INJECTION 41%: 1.0000 mL | Freq: Once | INTRAMUSCULAR | Status: DC

## 2020-05-04 MED ORDER — METHYLPREDNISOLONE ACETATE 40 MG/ML INJ SUSP (RADIOLOG: 120.0000 mg | Freq: Once | INTRAMUSCULAR | Status: DC

## 2020-05-08 ENCOUNTER — Encounter: Payer: Medicare HMO | Admitting: Registered Nurse

## 2020-05-11 ENCOUNTER — Encounter: Payer: Medicare HMO | Attending: Physical Medicine & Rehabilitation | Admitting: Registered Nurse

## 2020-05-11 ENCOUNTER — Encounter: Payer: Self-pay | Admitting: Registered Nurse

## 2020-05-11 ENCOUNTER — Other Ambulatory Visit: Payer: Self-pay

## 2020-05-11 VITALS — BP 152/91 | HR 79 | Ht 67.0 in | Wt 163.0 lb

## 2020-05-11 DIAGNOSIS — M1712 Unilateral primary osteoarthritis, left knee: Secondary | ICD-10-CM | POA: Diagnosis not present

## 2020-05-11 DIAGNOSIS — Z79891 Long term (current) use of opiate analgesic: Secondary | ICD-10-CM | POA: Insufficient documentation

## 2020-05-11 DIAGNOSIS — G894 Chronic pain syndrome: Secondary | ICD-10-CM | POA: Diagnosis not present

## 2020-05-11 DIAGNOSIS — Z5181 Encounter for therapeutic drug level monitoring: Secondary | ICD-10-CM | POA: Diagnosis not present

## 2020-05-11 DIAGNOSIS — M47816 Spondylosis without myelopathy or radiculopathy, lumbar region: Secondary | ICD-10-CM | POA: Insufficient documentation

## 2020-05-11 MED ORDER — OXYCODONE-ACETAMINOPHEN 7.5-325 MG PO TABS: 1.0000 | ORAL_TABLET | Freq: Three times a day (TID) | ORAL | 0 refills | Status: DC | PRN

## 2020-05-11 NOTE — Progress Notes (Signed)
Phone [phone] In person visit   Subjective:   Kevin [name] is a 79 y.o. year old very pleasant male patient who presents for/with See problem oriented charting Chief Complaint  Patient presents with  . Herpes Zoster    Patient said that he feels good, and he's not having any more outbreaks.    This visit occurred during the SARS-CoV-2 public health emergency.  Safety protocols were in place, including screening questions prior to the visit, additional usage of staff PPE, and extensive cleaning of exam room while observing appropriate contact time as indicated for disinfecting solutions.   Past Medical History-  Patient Active Problem List   Diagnosis Date Noted  . Parkinson's disease ([place]) 11/18/2019    Priority: High  . Afib ([place]) 07/06/2018    Priority: High  . 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 PAD (peripheral artery disease) ([place]) 01/08/2016    Priority: High  . Diastolic CHF ([place]) 30/86/5784    Priority: High  . CKD (chronic kidney disease), stage IV ([place]) 01/27/2014    Priority: High  . Carotid artery stenosis s/p L carotid endarterectomy 01/12/2012    Priority: High  . History of CVA (cerebrovascular accident) 11/17/2011    Priority: High  . Chronic pain syndrome 12/23/2009    Priority: High  . Constipation 09/19/2017    Priority: Medium  . Personal history of colonic polyps 05/15/2013    Priority: Medium  . Essential tremor 10/05/2009    Priority: Medium  . UNSPECIFIED ANEMIA 12/05/2008    Priority: Medium  . BPH (benign prostatic hyperplasia) 06/04/2007    Priority: Medium  . Fasting hyperglycemia 02/05/2007    Priority: Medium  . Hyperlipidemia 01/03/2007    Priority: Medium  . Essential hypertension 01/03/2007    Priority: Medium  . Hemiparesis ([place]) 04/16/2019    Priority: Low  . CAP (community acquired pneumonia) 03/17/2015    Priority: Low  . Family history of malignant neoplasm of gastrointestinal tract 05/15/2013    Priority: Low   . Inguinal hernia 05/01/2012    Priority: Low  . BURSITIS, HIP 12/21/2009    Priority: Low  . ACTINIC KERATOSIS, EAR, LEFT 03/09/2009    Priority: Low  . 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 Left shoulder pain 02/04/2020  . Loss of weight 10/08/2019  . Greater trochanteric bursitis of both hips 02/26/2019  . Degenerative arthritis of knee, bilateral 01/14/2019  . SIRS (systemic inflammatory response syndrome) ([place]) 01/08/2019  . Chest pain 01/08/2019  . Atelectasis of left lung 10/30/2018  . Lumbar spondylosis 03/17/2017    Medications- reviewed and updated Current Outpatient Medications  Medication Sig Dispense Refill  . albuterol (VENTOLIN HFA) 108 (90 Base) MCG/ACT inhaler Inhale 2 puffs into the lungs every 12 (twelve) hours as needed for wheezing or shortness of breath. 1 each 6  . amLODipine (NORVASC) 10 MG tablet TAKE 1 TABLET EVERY DAY 90 tablet 1  . aspirin EC 81 MG EC tablet Take 1 tablet (81 mg total) by mouth daily.    . cilostazol (PLETAL) 50 MG tablet Take 1 tablet by mouth twice daily 60 tablet 0  . fenofibrate 160 MG tablet Take 1 tablet (160 mg total) by mouth daily. 90 tablet 3  . furosemide (LASIX) 20 MG tablet TAKE 1 TABLET EVERY DAY 90 tablet 1  . gabapentin (NEURONTIN) 400 MG capsule Take 1 capsule (400  mg total) by mouth in the morning and at bedtime. 180 capsule 3  . linaclotide (LINZESS) 145 MCG CAPS capsule Take 145 mcg by mouth as needed (for stomach).     . Melatonin 10 MG TABS Take 10 mg by mouth at bedtime.    . metoprolol tartrate (LOPRESSOR) 25 MG tablet TAKE 1 TABLET TWICE DAILY 180 tablet 3  . oxyCODONE-acetaminophen (PERCOCET) 7.5-325 MG tablet Take 1 tablet by mouth every 8 (eight) hours as needed for severe pain. 80 tablet 0  . tamsulosin (FLOMAX) 0.4 MG CAPS capsule Take 1 capsule (0.4 mg total) by mouth daily. 90 capsule 3  . tiZANidine (ZANAFLEX) 4 MG tablet TAKE  1 TABLET EVERY 6 HOURS AS NEEDED FOR MUSCLE SPASM(S) 90 tablet 1  . valACYclovir (VALTREX) 1000 MG tablet Take 1 tablet (1,000 mg total) by mouth 3 (three) times daily. For 7 days for shingles. 21 tablet 0  . rosuvastatin (CRESTOR) 40 MG tablet Take 1 tablet (40 mg total) by mouth daily. 90 tablet 3   No current facility-administered medications for this visit.     Objective:  BP (!) 163/77   Pulse 64   Temp 98 F (36.7 C) (Temporal)   Ht 5\' 7"  (1.702 m)   Wt 160 lb (72.6 kg)   SpO2 97%   BMI 25.06 kg/m  Gen: NAD, resting comfortably CV: RRR no murmurs rubs or gallops Lungs: CTAB no crackles, wheeze, rhonchi Ext: no edema Skin: warm, dry Walks with cane, left hand intention tremor    Assessment and Plan   #Social update-patient is preparing to move to [place] shortly.  Will be moving to be closer to his son.  His wife will have more support.  We will certainly miss him and his wife-they have been amazing to work with. Thinks he will have another visit in January as well.   # has healed up from shingles  #Chronic pain syndrome-back (main issues), right hip, left shoulder, left knee.  Follows with pain management S: Last visit for pain management pain was worsening with try to decrease gabapentin-we went back to 400 mg twice daily of gabapentin last visit-since he was noticing increased need for oxycodone up to 3 times a day-patient states is now back to using oxycodone twice a day (except for recent flare)- he feels this has been helpful.  Had another injection December 6th and considering another one after seeing Dr. Tamala Julian. Had some radiation around left side- that got better with shot. Later overdid it and felt like that set him back. Working with PM&R  We did try to stop tizanidine last visit-did not go well especially since Patient came off diazepam/Valium to have more mental clarity. tizanidone also helps him sleep/relax muscles  A/P: chronic pain slightly worsened  lately with recent flare- has upcoming potential injection. Medication appears stable- will continue gabapentin 400mg  BID, tizanidine at night only, oxycodone mostly twice a day sometimes 3x a day through pain management. Patient seems more aware/less confused with this combo- we discussed as continues to age we may have to adjust further.    #hypertension/diastolic CHF- appears stable- no signs of fluid overload S: medication:  amlodipine 10mg , lasix 20 mg daily, metoprolol 25 mg BID Home readings #s:  Higher since  Pain flared up in his back recently but typically has been controlled when not in pain in recent months BP Readings from Last 3 Encounters:  05/12/20 (!) 163/77  05/11/20 (!) 152/91  05/04/20 123/66  A/P: poor  control today but in recent flare of pain- patient wants to hold off on medication adjustments to see if pain improvement over time will lower pain levels.   #CAD- follows with cardiology Dr. Harrington Challenger. Also with history carotid stenosis s/p left carotid endarterectomy #PAD- compliant with cilostazol/pletal, aspirin. Denies worsening issues/claudication. Dr. Oneida Alar vascular surgery in the past #hyperlipidemia S: Medication:rosuvastatin 40mg  - increased from atorvastatin 20mg , also on fenofibrate Lab Results  Component Value Date   CHOL 150 07/08/2019   HDL 37.50 (L) 07/08/2019   LDLCALC 81 07/08/2019   LDLDIRECT 134.9 12/26/2012   TRIG 158.0 (H) 07/08/2019   CHOLHDL 4 07/08/2019   A/P: hopefully improved control- update LDL today  # Hyperglycemia/insulin resistance/prediabetes S:  Medication: none Exercise and diet- limited by pain issues, has been overdoing sweets lately Lab Results  Component Value Date   HGBA1C 6.1 07/08/2019   HGBA1C 6.0 (H) 06/20/2018   HGBA1C 6.0 05/21/2015   A/P: with recent sweet intake increase- check a1c and discussed may need to cut this back. Also doing 1 coke a day - discouraged this but he states unlikely to change  #dysuria when drinks  coke- not with water- has not changed- he will update [place] if this changes. Started with UTI in October- improved. Nocturia several times a night. Also on flomax  #CKD IV- monitor with labs today. Avoid nsaids. Missed appointment in may- will call to reschedule nephrology.   # parkinson's- saw Dr. Carles Collet in 2021 did not tolerate meds like sinemet  # a fib - Only after CABg. Cardiology states no recurrence and no need for long term coumadin  Recommended follow up: Return in about 1 month (around 06/12/2020). 4-6 week follo wup Future Appointments  Date Time Provider [place]  05/19/2020 10:00 AM Lyndal Pulley, DO [facility] None  06/11/2020 10:00 AM Bayard Hugger, NP [facility] CPR   Lab/Order associations:   ICD-10-CM   1. Essential hypertension  I10   2. CKD (chronic kidney disease), stage IV ([facility])  N18.4   3. Hyperlipidemia, unspecified hyperlipidemia type  E78.5 CBC With Differential/Platelet    COMPLETE METABOLIC PANEL WITH GFR    Lipid Panel w/reflex Direct LDL  4. Hyperglycemia  R73.9 Hemoglobin A1c  5. Polyuria  R35.89 Urine Culture  6. Parkinson's disease ([place])  G20     Meds ordered this encounter  Medications  . rosuvastatin (CRESTOR) 40 MG tablet    Sig: Take 1 tablet (40 mg total) by mouth daily.    Dispense:  90 tablet    Refill:  3   Return precautions advised.  Garret Reddish, MD

## 2020-05-11 NOTE — Progress Notes (Signed)
Subjective:    Patient ID: Kevin [name], male    DOB: [birth year], 79 y.o.   MRN: [id number]  HPI: Kevin [name] is a 79 y.o. male whose appointment was changed to a My-Chart Video visit, to reduce the risk of exposure to the COVID-19 virus and to help Kevin [name] remain healthy and safe. The virtual visit will also provide continuity of care. Kevin [name] understanding and agrees with My-Chart Video Visit. He states his  pain is located in his lower back and occasionally radiating into his left lower extremity. He rates his pain 9. His current exercise regime is walking in his home with walker.  Kevin [name] reports he had a ESI at [facility] on 05/04/2020 with good relief noted.   Kevin [name] Morphine equivalent is 27.78 MME. Last Oral Swab was Performed on 04/09/2020, it was consistent.     Pain Inventory Average Pain 8 Pain Right Now 9 My pain is constant, sharp, burning, stabbing, tingling and aching  In the last 24 hours, has pain interfered with the following? General activity 8 Relation with others 9 Enjoyment of life 10 What TIME of day is your pain at its worst? daytime, evening and night Sleep (in general) Fair  Pain is worse with: walking, bending, sitting, standing and some activites Pain improves with: rest, heat/ice, medication and injections   Kevin [name] an injection with [facility] but he thinks he came home and overworked and "messed up"  Will try for another injection Relief from Meds: 5  Family History  Problem Relation Age of Onset  . Parkinsonism Father   . Dementia Father   . Cancer Father        Throat  . Colon cancer Mother        died in 64s  . Cancer Mother        Colon   Social History   Socioeconomic History  . Marital status: Married    Spouse name: Not on file  . Number of children: Not on file  . Years of education: Not on file  . Highest education level: Not on file  Occupational History  . Occupation: retired    Comment:  maintenance; electrician  Tobacco Use  . Smoking status: Former Smoker    Packs/day: 2.00    Years: 28.00    Pack years: 56.00    Types: Cigarettes    Quit date: 05/31/1987    Years since quitting: 32.9  . Smokeless tobacco: Never Used  . Tobacco comment: quit smoking "in my 40's"  Vaping Use  . Vaping Use: Never used  Substance and Sexual Activity  . Alcohol use: Not Currently    Alcohol/week: 0.0 standard drinks    Comment: rare, mikes hard lemonade  . Drug use: Not Currently    Types: Marijuana    Comment: hx of-4-5 yrs ago  . Sexual activity: Not Currently  Other Topics Concern  . Not on file  Social History Narrative   Married 33 years in 2015. Lived together for 12 years. 2 boys from first wife. 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       Hobbies: yardwork, [place] barn, woodwork, collect knive   Right handed   One story home   Caffeine yes   Social Determinants of Health   Financial Resource Strain: Not on  file  Food Insecurity: Not on file  Transportation Needs: Not on file  Physical Activity: Not on file  Stress: Not on file  Social Connections: Not on file   Past Surgical History:  Procedure Laterality Date  . ABDOMINAL AORTIC ANEURYSM REPAIR  2010  . CAROTID ENDARTERECTOMY Left 09/20/2004  . CATARACT EXTRACTION, BILATERAL     2016-2017  . COLONOSCOPY    . 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 ENDARTERECTOMY Right 10/12/2015   Procedure: RIGHT CAROTID ENDARTERECTOMY WITH PATCH ANGIOPLASTY;  Surgeon: Elam Dutch, MD;  Location: [place];  Service: Vascular;  Laterality:  Right;  . INGUINAL HERNIA REPAIR Left 02/05/2013   Procedure: HERNIA REPAIR INGUINAL ADULT;  Surgeon: Joyice Faster. Cornett, MD;  Location: [place];  Service: General;  Laterality: Left;  . INGUINAL HERNIA REPAIR Left   . INSERTION OF MESH Left 02/05/2013   Procedure: INSERTION OF MESH;  Surgeon: Joyice Faster. Cornett, MD;  Location: [facility];  Service: General;  Laterality: Left;  . LEFT HEART CATH AND CORONARY ANGIOGRAPHY N/A 06/21/2018   Procedure: LEFT HEART CATH AND CORONARY ANGIOGRAPHY;  Surgeon: Jettie Booze, MD;  Location: [place] [facility];  Service: Cardiovascular;  Laterality: N/A;  . SHOULDER ARTHROSCOPY WITH ROTATOR CUFF REPAIR AND SUBACROMIAL DECOMPRESSION Left 05/01/2014   Procedure: LEFT ARTHROSCOPY SHOULDER SUBACROMIAL DECOMPRESSION,DISTAL CLAVICAL RESECTION AND ROTATOR CUFF REPAIR;  Surgeon: Marin Shutter, MD;  Location: [place];  Service: Orthopedics;  Laterality: Left;  . TEE WITHOUT CARDIOVERSION N/A 06/27/2018   Procedure: TRANSESOPHAGEAL ECHOCARDIOGRAM (TEE);  Surgeon: Melrose Nakayama, MD;  Location: [place];  Service: Open Heart Surgery;  Laterality: N/A;  . TESTICLAR CYST EXCISION Left   . TONSILLECTOMY    . VASECTOMY     Past Surgical History:  Procedure Laterality Date  . ABDOMINAL AORTIC ANEURYSM REPAIR  2010  . CAROTID ENDARTERECTOMY Left 09/20/2004  . CATARACT EXTRACTION, BILATERAL     2016-2017  . COLONOSCOPY    . 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 ENDARTERECTOMY Right 10/12/2015   Procedure: RIGHT CAROTID ENDARTERECTOMY WITH PATCH ANGIOPLASTY;  Surgeon: Elam Dutch, MD;  Location: [place];  Service: Vascular;  Laterality: Right;  . INGUINAL HERNIA  REPAIR Left 02/05/2013   Procedure: HERNIA REPAIR INGUINAL ADULT;  Surgeon: Joyice Faster. Cornett, MD;  Location: [place];  Service: General;  Laterality: Left;  . INGUINAL HERNIA REPAIR Left   . INSERTION OF MESH Left 02/05/2013   Procedure: INSERTION OF MESH;  Surgeon: Joyice Faster. Cornett, MD;  Location: [place];  Service: General;  Laterality: Left;  . LEFT HEART CATH AND CORONARY ANGIOGRAPHY N/A 06/21/2018   Procedure: LEFT HEART CATH AND CORONARY ANGIOGRAPHY;  Surgeon: Jettie Booze, MD;  Location: [place];  Service: Cardiovascular;  Laterality: N/A;  . SHOULDER ARTHROSCOPY WITH ROTATOR CUFF REPAIR AND SUBACROMIAL DECOMPRESSION Left 05/01/2014   Procedure: LEFT ARTHROSCOPY SHOULDER SUBACROMIAL DECOMPRESSION,DISTAL CLAVICAL RESECTION AND ROTATOR CUFF REPAIR;  Surgeon: Marin Shutter, MD;  Location: [place];  Service: Orthopedics;  Laterality: Left;  . TEE WITHOUT CARDIOVERSION N/A 06/27/2018   Procedure: TRANSESOPHAGEAL ECHOCARDIOGRAM (TEE);  Surgeon: Melrose Nakayama, MD;  Location: [place];  Service: Open Heart Surgery;  Laterality: N/A;  . TESTICLAR CYST EXCISION Left   . TONSILLECTOMY    . VASECTOMY     Past Medical History:  Diagnosis Date  . Arthritis    DDD- Lower Back  . Bradycardia   . CAP (community acquired pneumonia) 03/17/2015   "THAT'S WHAT THEY ARE THINKING; THEY ARE NOT SURE"  . Carotid artery occlusion   . Chest pain 06/19/2018  . Chronic lower back pain    DDD  . CKD (chronic kidney disease)    Dr. Corliss Parish  . 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 Diverticulosis   . Enlarged prostate    takes Flomax daily  . History of blood transfusion    "probably when they did aortic aneurysm"  . History of colon polyps    benign  . Hyperlipidemia    . Hypertension    takes Amlodipine and Zebeta daily  . Insomnia    takes Melatonin nightly  . Joint pain   . 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     . Muscle spasm    takes Zanaflex daily as needed  . Peripheral edema    takes Furosemide daily  . Pneumonia    hx of   . Rheumatic fever 1946  . Rheumatoid arthritis ([place])   . Short-term memory loss    minimal  . Shortness of breath dyspnea    with exertion but lasts very short period of time  . TIA (transient ischemic attack)    x 2;takes Plavix daily  . Urinary frequency    takes Flomax daily  . Urinary urgency    BP (!) 152/91 Comment: reported  Pulse 79 Comment: reported  Ht 5\' 7"  (1.702 m) Comment: reported  Wt 163 lb (73.9 kg) Comment: reported  BMI 25.53 kg/m   Opioid Risk Score:   Fall Risk Score:  `1  Depression screen PHQ 2/9  Depression screen [facility] 2/9 05/11/2020 04/09/2020 03/11/2020 01/09/2020 12/13/2019 11/14/2019 05/09/2019  Decreased Interest 0 1 0 0 0 0 1  Down, Depressed, Hopeless 0 1 0 0 0 0 1  PHQ - 2 Score 0 2 0 0 0 0 2  Altered sleeping - - - - - - 0  Tired, decreased energy - - - - - - 3  Change in appetite - - - - - - 1  Feeling bad or failure about yourself  - - - - - - 1  Trouble concentrating - - - - - - 1  Moving slowly or fidgety/restless - - - - - - 0  PHQ-9 Score - - - - - - 8  Some recent data might be hidden    Review of Systems  Constitutional: Negative.   HENT: Negative.   Eyes: Negative.   Respiratory: Negative.   Cardiovascular: Negative.   Gastrointestinal: Negative.   Endocrine: Negative.   Genitourinary: Negative.   Musculoskeletal: Positive for back pain.  Skin: Negative.   Allergic/Immunologic: Negative.   Neurological: Negative.   Hematological: Negative.   Psychiatric/Behavioral: Negative.   All  other systems reviewed and are negative.      Objective:   Physical Exam Vitals and nursing note reviewed.  Musculoskeletal:     Comments:  No Physical Exam Performed: My-Chart Video Visit           Assessment & Plan:  1. Lumbar Spondylosis/ Lumbar Stenosis:05/11/2020 Continue current medication regime. Refilled:Oxycodone 7.5/325 mg one tabletevery 8 hoursas needed #80.05/11/2020 We will continue the opioid monitoring program, this consists of regular clinic visits, examinations, urine drug screen, pill counts as well as use of [place] Controlled Substance Reporting system. A 12 month History has been reviewed on the [place] Controlled Substance Reporting Systemon12/13/2021. 2.Bilateral Knees OA/ChronicBilateralKnee Pain:No complaints todat.S/PBilateral KneeInjection by DrSmithon 12/09/2019, Orthopedist Following12/13/2021. 3.BilateralGreater Trochanter Bursitis:No complaints today.Orthopedist Following.Continue to alternate Ice and Heat Therapy.05/11/2020  F/U in 1 month  My Chart Video Visit Established Patient Location of Patient: In his Home Location of Provider: In the Office

## 2020-05-11 NOTE — Patient Instructions (Addendum)
No changes today  Please stop by lab before you go If you have mychart- we will send your results within 3 business days of [place] receiving them.  If you do not have mychart- we will call you about results within 5 business days of [place] receiving them.  *please note we are currently using [facility] which has a longer processing time than [facility] typically so labs may not come back as quickly as in the past *please also note that you will see labs on mychart as soon as they post. I will later go in and write notes on them- will say "notes from Dr. [name]"  Recommended follow up: 4-6 weeks for follow up- at least sometime before you move so we can say goodbye  See if nephrology/kidney doctors can see you before you go. Just so there is an update for new kidney doctor with transfer

## 2020-05-12 ENCOUNTER — Other Ambulatory Visit: Payer: Self-pay

## 2020-05-12 ENCOUNTER — Encounter: Payer: Self-pay | Admitting: Family Medicine

## 2020-05-12 ENCOUNTER — Ambulatory Visit (INDEPENDENT_AMBULATORY_CARE_PROVIDER_SITE_OTHER): Payer: Medicare HMO | Admitting: Family Medicine

## 2020-05-12 VITALS — BP 163/77 | HR 64 | Temp 98.0°F | Ht 67.0 in | Wt 160.0 lb

## 2020-05-12 DIAGNOSIS — N184 Chronic kidney disease, stage 4 (severe): Secondary | ICD-10-CM | POA: Diagnosis not present

## 2020-05-12 DIAGNOSIS — R739 Hyperglycemia, unspecified: Secondary | ICD-10-CM

## 2020-05-12 DIAGNOSIS — R3589 Other polyuria: Secondary | ICD-10-CM

## 2020-05-12 DIAGNOSIS — I739 Peripheral vascular disease, unspecified: Secondary | ICD-10-CM | POA: Diagnosis not present

## 2020-05-12 DIAGNOSIS — I5032 Chronic diastolic (congestive) heart failure: Secondary | ICD-10-CM

## 2020-05-12 DIAGNOSIS — I10 Essential (primary) hypertension: Secondary | ICD-10-CM

## 2020-05-12 DIAGNOSIS — E785 Hyperlipidemia, unspecified: Secondary | ICD-10-CM | POA: Diagnosis not present

## 2020-05-12 DIAGNOSIS — G20 Parkinson's disease: Secondary | ICD-10-CM | POA: Diagnosis not present

## 2020-05-12 DIAGNOSIS — I2510 Atherosclerotic heart disease of native coronary artery without angina pectoris: Secondary | ICD-10-CM

## 2020-05-12 DIAGNOSIS — G20A1 Parkinson's disease without dyskinesia, without mention of fluctuations: Secondary | ICD-10-CM

## 2020-05-12 DIAGNOSIS — I4891 Unspecified atrial fibrillation: Secondary | ICD-10-CM | POA: Diagnosis not present

## 2020-05-12 MED ORDER — ROSUVASTATIN CALCIUM 40 MG PO TABS
40.0000 mg | ORAL_TABLET | Freq: Every day | ORAL | 3 refills | Status: DC
Start: 2020-05-12 — End: 2020-10-07

## 2020-05-14 ENCOUNTER — Telehealth: Payer: Self-pay | Admitting: Family Medicine

## 2020-05-14 NOTE — Telephone Encounter (Signed)
Patient called stating that he is scheduled to see Dr Tamala Julian on Tuesday so that he can have another order put in for epidural for his back pain. He said that he is in a lot of pain and asked if he could be seen sooner or if there is anything else Dr Tamala Julian would recommend in the mean time.  I told him that Dr Tamala Julian is not here tomorrow or Monday but I would check to see if there was anything we could do.

## 2020-05-15 LAB — CBC WITH DIFFERENTIAL/PLATELET
Absolute Monocytes: 1448 cells/uL — ABNORMAL HIGH (ref 200–950)
Basophils Absolute: 108 cells/uL (ref 0–200)
Basophils Relative: 0.7 %
Eosinophils Absolute: 724 cells/uL — ABNORMAL HIGH (ref 15–500)
Eosinophils Relative: 4.7 %
HCT: 34.9 % — ABNORMAL LOW (ref 38.5–50.0)
Hemoglobin: 11.7 g/dL — ABNORMAL LOW (ref 13.2–17.1)
Lymphs Abs: 3634 cells/uL (ref 850–3900)
MCH: 30.7 pg (ref 27.0–33.0)
MCHC: 33.5 g/dL (ref 32.0–36.0)
MCV: 91.6 fL (ref 80.0–100.0)
MPV: 10.2 fL (ref 7.5–12.5)
Monocytes Relative: 9.4 %
Neutro Abs: 9486 cells/uL — ABNORMAL HIGH (ref 1500–7800)
Neutrophils Relative %: 61.6 %
Platelets: 288 10*3/uL (ref 140–400)
RBC: 3.81 10*6/uL — ABNORMAL LOW (ref 4.20–5.80)
RDW: 14.4 % (ref 11.0–15.0)
Total Lymphocyte: 23.6 %
WBC: 15.4 10*3/uL — ABNORMAL HIGH (ref 3.8–10.8)

## 2020-05-15 LAB — URINE CULTURE
MICRO NUMBER:: 11315503
SPECIMEN QUALITY:: ADEQUATE

## 2020-05-15 LAB — COMPLETE METABOLIC PANEL WITH GFR
AG Ratio: 1.5 (calc) (ref 1.0–2.5)
ALT: 26 U/L (ref 9–46)
AST: 34 U/L (ref 10–35)
Albumin: 4 g/dL (ref 3.6–5.1)
Alkaline phosphatase (APISO): 41 U/L (ref 35–144)
BUN/Creatinine Ratio: 19 (calc) (ref 6–22)
BUN: 40 mg/dL — ABNORMAL HIGH (ref 7–25)
CO2: 28 mmol/L (ref 20–32)
Calcium: 9.3 mg/dL (ref 8.6–10.3)
Chloride: 100 mmol/L (ref 98–110)
Creat: 2.09 mg/dL — ABNORMAL HIGH (ref 0.70–1.11)
GFR, Est African American: 33 mL/min/{1.73_m2} — ABNORMAL LOW (ref >=60)
GFR, Est Non African American: 29 mL/min/{1.73_m2} — ABNORMAL LOW (ref >=60)
Globulin: 2.6 g/dL (calc) (ref 1.9–3.7)
Glucose, Bld: 93 mg/dL (ref 65–99)
Potassium: 4.9 mmol/L (ref 3.5–5.3)
Sodium: 136 mmol/L (ref 135–146)
Total Bilirubin: 0.8 mg/dL (ref 0.2–1.2)
Total Protein: 6.6 g/dL (ref 6.1–8.1)

## 2020-05-15 LAB — LIPID PANEL W/REFLEX DIRECT LDL
Cholesterol: 104 mg/dL (ref <200)
HDL: 56 mg/dL (ref >=40)
LDL Cholesterol (Calc): 34 mg/dL (calc)
Non-HDL Cholesterol (Calc): 48 mg/dL (calc) (ref <130)
Total CHOL/HDL Ratio: 1.9 (calc) (ref <5.0)
Triglycerides: 68 mg/dL (ref <150)

## 2020-05-15 LAB — HEMOGLOBIN A1C
Hgb A1c MFr Bld: 5.7 % of total Hgb — ABNORMAL HIGH (ref <5.7)
Mean Plasma Glucose: 117 mg/dL
eAG (mmol/L): 6.5 mmol/L

## 2020-05-16 ENCOUNTER — Other Ambulatory Visit: Payer: Self-pay | Admitting: Family Medicine

## 2020-05-16 MED ORDER — CEPHALEXIN 250 MG PO CAPS: 250.0000 mg | ORAL_CAPSULE | Freq: Three times a day (TID) | ORAL | 0 refills | Status: AC

## 2020-05-17 NOTE — Telephone Encounter (Signed)
We can put it in but would like to wait at least 4 weeks if possible, he can talk to the physician who would do the injection as well

## 2020-05-18 ENCOUNTER — Other Ambulatory Visit: Payer: Self-pay

## 2020-05-18 DIAGNOSIS — M47816 Spondylosis without myelopathy or radiculopathy, lumbar region: Secondary | ICD-10-CM

## 2020-05-18 NOTE — Telephone Encounter (Signed)
Patient states that he is in a lot of pain and would like to get injection sooner. Order placed. Told patient that recommended time frame was 4 weeks from last injection which puts him in the first week of January. Patient has appointment tomorrow 05/19/2020 and would like to discuss when he is able to get injection.

## 2020-05-18 NOTE — Progress Notes (Signed)
[place] Phone: [phone] Subjective:   [name], am serving as a scribe for Dr. Hulan Saas. This visit occurred during the SARS-CoV-2 public health emergency.  Safety protocols were in place, including screening questions prior to the visit, additional usage of staff PPE, and extensive cleaning of exam room while observing appropriate contact time as indicated for disinfecting solutions.   I'm seeing this patient by the request  of:  Marin Olp, MD  CC:   ION:[id number]   04/07/2020 Patient's resting tremor of the upper extremity and lower extremity on the right side seems to be somewhat worse.  We will continue to monitor.  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  Patient does have also moderate right foraminal narrowing.  Patient's MRI is greater than 43 years old.  Patient at this point is on significant amount of oxycodone from pain management.  We have him on gabapentin but any increasing in dose causes more difficulty.  I do believe that a new MRI could be beneficial and then we can consider potentially epidurals.  Patient as well as wife is in agreement with the plan.  We will discuss after imaging.  Update 05/19/2020 KELL FERRIS is a 79 y.o. male coming in with complaint of low back pain. Patient had epidural on 05/04/2020. Patient has since called back to office requesting another epidural as he has had an increase in his pain. Patient feels he is unable to wait 4 weeks to get another injection. Patient said that the injection did help with overall pain but has now developed new pain. Patient does have kidney infection and now left lower back pain has increased. Unable to push himself up with left arm as this causes increase in pain. Right sided pain in lumbar spine has improved. Would like injection in left knee  today.   MRI 04/26/2020 lumbar IMPRESSION: Comparison is made to the prior lumbar spine MRI of 08/09/2016.  At L4-L5, there is progressive multifactorial moderate central canal stenosis and bilateral subarticular narrowing. Unchanged bilateral neural foraminal narrowing (severe right, moderate/severe left).  At L3-L4, there is progressive multifactorial moderate/severe left neural foraminal narrowing. Unchanged mild left subarticular, central canal and right neural foraminal narrowing also at this level.  At L5-S1, unchanged multifactorial bilateral neural foraminal narrowing (severe right, moderate left). Unchanged mild right subarticular and central canal narrowing also present at this level.  Dextroscoliosis.  Redemonstrated partial degenerative fusion across the L2-L3 and L3-L4 disc spaces.    Past Medical History:  Diagnosis Date  . Arthritis    DDD- Lower Back  . Bradycardia   . CAP (community acquired pneumonia) 03/17/2015   "THAT'S WHAT THEY ARE THINKING; THEY ARE NOT SURE"  . Carotid artery occlusion   . Chest pain 06/19/2018  . Chronic lower back pain    DDD  . CKD (chronic kidney disease)    Dr. Corliss Parish  . 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 Diverticulosis   . Enlarged prostate    takes Flomax daily  . History of blood transfusion    "probably when they did aortic aneurysm"  . History of colon polyps    benign  . Hyperlipidemia   .  Hypertension    takes Amlodipine and Zebeta daily  . Insomnia    takes Melatonin nightly  . Joint pain   . 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     . Muscle spasm    takes Zanaflex daily as needed  . Peripheral edema    takes Furosemide daily  . Pneumonia    hx of   . Rheumatic fever 1946  . Rheumatoid  arthritis ([place])   . Short-term memory loss    minimal  . Shortness of breath dyspnea    with exertion but lasts very short period of time  . TIA (transient ischemic attack)    x 2;takes Plavix daily  . Urinary frequency    takes Flomax daily  . Urinary urgency    Past Surgical History:  Procedure Laterality Date  . ABDOMINAL AORTIC ANEURYSM REPAIR  2010  . CAROTID ENDARTERECTOMY Left 09/20/2004  . CATARACT EXTRACTION, BILATERAL     2016-2017  . COLONOSCOPY    . 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 ENDARTERECTOMY Right 10/12/2015   Procedure: RIGHT CAROTID ENDARTERECTOMY WITH PATCH ANGIOPLASTY;  Surgeon: Elam Dutch, MD;  Location: [facility];  Service: Vascular;  Laterality: Right;  . INGUINAL HERNIA REPAIR Left 02/05/2013   Procedure: HERNIA REPAIR INGUINAL ADULT;  Surgeon: Joyice Faster. Cornett, MD;  Location: [facility];  Service: General;  Laterality: Left;  . INGUINAL HERNIA REPAIR Left   . INSERTION OF MESH Left 02/05/2013   Procedure: INSERTION OF MESH;  Surgeon: Joyice Faster. Cornett, MD;  Location: [facility];  Service: General;  Laterality: Left;  . LEFT HEART CATH AND CORONARY ANGIOGRAPHY N/A 06/21/2018   Procedure: LEFT HEART CATH AND CORONARY ANGIOGRAPHY;  Surgeon: Jettie Booze, MD;  Location: [facility];  Service: Cardiovascular;  Laterality: N/A;  . SHOULDER ARTHROSCOPY WITH ROTATOR CUFF REPAIR AND SUBACROMIAL DECOMPRESSION Left 05/01/2014   Procedure: LEFT ARTHROSCOPY SHOULDER SUBACROMIAL DECOMPRESSION,DISTAL CLAVICAL RESECTION AND ROTATOR CUFF REPAIR;  Surgeon: Marin Shutter, MD;  Location: [facility];  Service: Orthopedics;  Laterality: Left;  . TEE WITHOUT  CARDIOVERSION N/A 06/27/2018   Procedure: TRANSESOPHAGEAL ECHOCARDIOGRAM (TEE);  Surgeon: Melrose Nakayama, MD;  Location: [facility];  Service: Open Heart Surgery;  Laterality: N/A;  . TESTICLAR CYST EXCISION Left   . TONSILLECTOMY    . VASECTOMY     Social History   Socioeconomic History  . Marital status: Married    Spouse name: Not on file  . Number of children: Not on file  . Years of education: Not on file  . Highest education level: Not on file  Occupational History  . Occupation: retired    Comment: maintenance; electrician  Tobacco Use  . Smoking status: Former Smoker    Packs/day: 2.00    Years: 28.00    Pack years: 56.00    Types: Cigarettes    Quit date: 05/31/1987    Years since quitting: 32.9  . Smokeless tobacco: Never Used  . Tobacco comment: quit smoking "in my 40's"  Vaping Use  . Vaping Use: Never used  Substance and Sexual Activity  . Alcohol use: Not Currently    Alcohol/week: 0.0 standard drinks    Comment: rare,  mikes hard lemonade  . Drug use: Not Currently    Types: Marijuana    Comment: hx of-4-5 yrs ago  . Sexual activity: Not Currently  Other Topics Concern  . Not on file  Social History Narrative   Married 33 years in 2015. Lived together for 12 years. 2 boys from first wife. 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       Hobbies: yardwork, [place] barn, woodwork, collect knive   Right handed   One story home   Caffeine yes   Social Determinants of Health   Financial Resource Strain: Not on file  Food Insecurity: Not on file  Transportation Needs: Not on file  Physical Activity: Not on file  Stress: Not on file  Social Connections: Not on file   Allergies  Allergen Reactions  . Hydralazine Other (See Comments)    Pt stated he feels drunk  . Mirtazapine Other (See Comments)    Very tired, feeling drunk, loopy  .  Sinemet [Carbidopa-Levodopa] Other (See Comments)    GI symptoms    Family History  Problem Relation Age of Onset  . Parkinsonism Father   . Dementia Father   . Cancer Father        Throat  . Colon cancer Mother        died in 19s  . Cancer Mother        Colon     Current Outpatient Medications (Cardiovascular):  .  amLODipine (NORVASC) 10 MG tablet, TAKE 1 TABLET EVERY DAY .  fenofibrate 160 MG tablet, Take 1 tablet (160 mg total) by mouth daily. .  furosemide (LASIX) 20 MG tablet, TAKE 1 TABLET EVERY DAY .  metoprolol tartrate (LOPRESSOR) 25 MG tablet, TAKE 1 TABLET TWICE DAILY .  rosuvastatin (CRESTOR) 40 MG tablet, Take 1 tablet (40 mg total) by mouth daily.  Current Outpatient Medications (Respiratory):  .  albuterol (VENTOLIN HFA) 108 (90 Base) MCG/ACT inhaler, Inhale 2 puffs into the lungs every 12 (twelve) hours as needed for wheezing or shortness of breath.  Current Outpatient Medications (Analgesics):  .  aspirin EC 81 MG EC tablet, Take 1 tablet (81 mg total) by mouth daily. Marland Kitchen  oxyCODONE-acetaminophen (PERCOCET) 7.5-325 MG tablet, Take 1 tablet by mouth every 8 (eight) hours as needed for severe pain.   Current Outpatient Medications (Other):  .  cephALEXin (KEFLEX) 250 MG capsule, Take 1 capsule (250 mg total) by mouth 3 (three) times daily for 10 days. Marland Kitchen  gabapentin (NEURONTIN) 400 MG capsule, Take 1 capsule (400 mg total) by mouth in the morning and at bedtime. Marland Kitchen  linaclotide (LINZESS) 145 MCG CAPS capsule, Take 145 mcg by mouth as needed (for stomach).  .  Melatonin 10 MG TABS, Take 10 mg by mouth at bedtime. .  tamsulosin (FLOMAX) 0.4 MG CAPS capsule, Take 1 capsule (0.4 mg total) by mouth daily. Marland Kitchen  tiZANidine (ZANAFLEX) 4 MG tablet, TAKE 1 TABLET EVERY 6 HOURS AS NEEDED FOR MUSCLE SPASM(S) .  valACYclovir (VALTREX) 1000 MG tablet, Take 1 tablet (1,000 mg total) by mouth 3 (three) times daily. For 7 days for shingles.   Reviewed prior external information  including notes and imaging from  primary care provider As well as notes that were available from care everywhere and other healthcare systems.  Past medical history, social, surgical and family  history all reviewed in electronic medical record.  No pertanent information unless stated regarding to the chief complaint.   Review of Systems:  No headache, visual changes, nausea, vomiting, diarrhea, constipation, dizziness, a, skin rash, fevers, chills, night sweats, weight loss, swollen lymph nodes,, joint swelling, chest pain, shortness of breath, mood changes. POSITIVE muscle aches body aches, abdominal pain, initially dysuria but since on antibiotics not so much  Objective  Blood pressure 130/70, pulse 69, height 5\' 7"  (1.702 m), weight 162 lb (73.5 kg), SpO2 93 %.   General: No apparent distress alert and oriented x3 mood and affect normal, dressed appropriately.  Masked facies noted HEENT: Pupils equal, extraocular movements intact  Respiratory: Patient's speak in full sentences and does not appear short of breath  Cardiovascular: Trace lower extremity edema, non tender, no erythema  Ambulates with the aid of a cane Knee: Left valgus deformity noted.  Abnormal thigh to calf ratio.  Tender to palpation over medial and PF joint line.  ROM mild limited range of motion in all planes hypertonicity noted with mild cogwheeling instability with valgus force.  painful patellar compression. Patellar glide with moderate crepitus. Patellar and quadriceps tendons unremarkable. Hamstring and quadriceps strength is normal. Contralateral knee shows arthritic changes as well  After informed written and verbal consent, patient was seated on exam table. Left knee was prepped with alcohol swab and utilizing anterolateral approach, patient's left knee space was injected with 4:1  marcaine 0.5%: Kenalog 40mg /dL. Patient tolerated the procedure well without immediate complications.    Impression and  Recommendations:     The above documentation has been reviewed and is accurate and complete Lyndal Pulley, DO

## 2020-05-19 ENCOUNTER — Other Ambulatory Visit: Payer: Self-pay

## 2020-05-19 ENCOUNTER — Ambulatory Visit: Payer: Medicare HMO | Admitting: Family Medicine

## 2020-05-19 ENCOUNTER — Encounter: Payer: Self-pay | Admitting: Family Medicine

## 2020-05-19 VITALS — BP 130/70 | HR 69 | Ht 67.0 in | Wt 162.0 lb

## 2020-05-19 DIAGNOSIS — M47816 Spondylosis without myelopathy or radiculopathy, lumbar region: Secondary | ICD-10-CM

## 2020-05-19 DIAGNOSIS — G20 Parkinson's disease: Secondary | ICD-10-CM

## 2020-05-19 DIAGNOSIS — M170 Bilateral primary osteoarthritis of knee: Secondary | ICD-10-CM

## 2020-05-19 NOTE — Assessment & Plan Note (Signed)
Patient has been responding somewhat to epidurals and patient would like another one at this time.  Patient will be moving to [place] and will see if anybody else can do that therapy will get one before he leaves.  Ordered today.  Discontinue Pletal completely at this time patient otherwise will continue with the gabapentin.  We will see how patient responds.  Concerns of the back pain can be secondary to more of the kidney as well.  Patient knows red flags and when to seek medical attention.  Patient is following up with primary care in the near future and has been on Keflex at this time.

## 2020-05-19 NOTE — Patient Instructions (Addendum)
Injected knee today Go ahead with epidural Stop Pletal indefinatley Follow up with Dr. [name] for infection We will try to find [place] providers See me again 6 weeks

## 2020-05-19 NOTE — Assessment & Plan Note (Signed)
Known arthritic changes of the knees bilaterally.  Multiple different comorbidities making treatment difficult.  Chronic problem with exacerbation.  Patient has responded well to injections and last one was 5 months ago.  We will try again and hopefully patient will respond.  Discussed icing regimen and home exercises.  Increase activity slowly.  Follow-up again 6 to 12 weeks.

## 2020-05-25 ENCOUNTER — Ambulatory Visit
Admission: RE | Admit: 2020-05-25 | Discharge: 2020-05-25 | Disposition: A | Discharge status: Home / Self Care | Payer: Medicare HMO | Source: Ambulatory Visit | Attending: Family Medicine | Admitting: Family Medicine

## 2020-05-25 ENCOUNTER — Other Ambulatory Visit: Payer: Self-pay

## 2020-05-25 DIAGNOSIS — M47817 Spondylosis without myelopathy or radiculopathy, lumbosacral region: Secondary | ICD-10-CM | POA: Diagnosis not present

## 2020-05-25 DIAGNOSIS — M47816 Spondylosis without myelopathy or radiculopathy, lumbar region: Secondary | ICD-10-CM

## 2020-05-25 MED ORDER — IOPAMIDOL (ISOVUE-M 200) INJECTION 41%
1.0000 mL | Freq: Once | INTRAMUSCULAR | Status: AC
  Administered 2020-05-25: 1 mL via EPIDURAL

## 2020-05-25 MED ORDER — METHYLPREDNISOLONE ACETATE 40 MG/ML INJ SUSP (RADIOLOG
120.0000 mg | Freq: Once | INTRAMUSCULAR | Status: AC
  Administered 2020-05-25: 120 mg via EPIDURAL

## 2020-05-25 NOTE — Discharge Instructions (Signed)
Post Procedure Spinal Discharge Instruction Sheet  1. You may resume a regular diet and any medications that you routinely take (including pain medications).  2. No driving day of procedure.  3. Light activity throughout the rest of the day.  Do not do any strenuous work, exercise, bending or lifting.  The day following the procedure, you can resume normal physical activity but you should refrain from exercising or physical therapy for at least three days thereafter.   Common Side Effects:   Headaches- take your usual medications as directed by your physician.  Increase your fluid intake.  Caffeinated beverages may be helpful.  Lie flat in bed until your headache resolves.   Restlessness or inability to sleep- you may have trouble sleeping for the next few days.  Ask your referring physician if you need any medication for sleep.   Facial flushing or redness- should subside within a few days.   Increased pain- a temporary increase in pain a day or two following your procedure is not unusual.  Take your pain medication as prescribed by your referring physician.   Leg cramps  Please contact our office at [phone] for the following symptoms:  Fever greater than 100 degrees.  Headaches unresolved with medication after 2-3 days.  Increased swelling, pain, or redness at injection site.  RESUME PLETAL 4 HOURS AFTER YOUR INJECTION TIME.

## 2020-05-27 ENCOUNTER — Telehealth: Payer: Self-pay | Admitting: Family Medicine

## 2020-05-27 NOTE — Telephone Encounter (Signed)
Left message with pt spouse for patient to call back and schedule Medicare Annual Wellness Visit (AWV) either virtually OR in office.   Last AWV 04/16/19; please schedule at anytime with [facility]Nurse Health Advisor at [facility].  This should be a 45 minute visit.

## 2020-05-28 ENCOUNTER — Telehealth: Payer: Self-pay

## 2020-05-28 DIAGNOSIS — N3000 Acute cystitis without hematuria: Secondary | ICD-10-CM

## 2020-05-28 NOTE — Telephone Encounter (Signed)
Pt.'s wife states he finished his kidney medication. However, Pt is still having pain in his sides. Pt wants to know how to proceed.

## 2020-05-28 NOTE — Telephone Encounter (Signed)
See below

## 2020-05-30 NOTE — Telephone Encounter (Signed)
Please collect another urine culture under acute cystitis (order and set him up for lab visit)

## 2020-06-01 NOTE — Telephone Encounter (Signed)
Urine Culture ordered, ok to schedule visit for lab.

## 2020-06-01 NOTE — Addendum Note (Signed)
Addended by: Clyde Lundborg A on: 06/01/2020 10:01 AM   Modules accepted: Orders

## 2020-06-05 ENCOUNTER — Other Ambulatory Visit: Payer: Self-pay

## 2020-06-05 ENCOUNTER — Ambulatory Visit (INDEPENDENT_AMBULATORY_CARE_PROVIDER_SITE_OTHER): Payer: Medicare HMO

## 2020-06-05 ENCOUNTER — Other Ambulatory Visit: Payer: Medicare HMO

## 2020-06-05 VITALS — BP 142/70 | HR 76 | Temp 97.6°F | Resp 20 | Wt 163.0 lb

## 2020-06-05 DIAGNOSIS — Z0000 Encounter for general adult medical examination without abnormal findings: Secondary | ICD-10-CM

## 2020-06-05 DIAGNOSIS — N3000 Acute cystitis without hematuria: Secondary | ICD-10-CM | POA: Diagnosis not present

## 2020-06-05 NOTE — Progress Notes (Signed)
Subjective:   Kevin [name] is a 80 y.o. male who presents for Medicare Annual/Subsequent preventive examination. Along with wife Kevin [name]  Review of Systems           Objective:    There were no vitals filed for this visit. There is no height or weight on file to calculate BMI.  Advanced Directives 11/18/2019 04/16/2019 01/07/2019 01/07/2019 06/19/2018 06/19/2018 02/01/2018  Does Patient Have a Medical Advance Directive? Yes Yes Yes No Yes Yes Yes  Type of Advance Directive - Living will;Healthcare Power of [place];Living will - [place];Living will [place];Living will [place];Living will  Does patient want to make changes to medical advance directive? - No - Patient declined No - Patient declined - No - Patient declined - -  Copy of [place] in Chart? - No - copy requested No - copy requested - No - copy requested No - copy requested Yes  Would patient like information on creating a medical advance directive? - - No - Patient declined No - Patient declined - - -  Pre-existing out of facility DNR order (yellow form or pink MOST form) - - - - - - -    Current Medications (verified) Outpatient Encounter Medications as of 06/05/2020  Medication Sig  . albuterol (VENTOLIN HFA) 108 (90 Base) MCG/ACT inhaler Inhale 2 puffs into the lungs every 12 (twelve) hours as needed for wheezing or shortness of breath.  Marland Kitchen amLODipine (NORVASC) 10 MG tablet TAKE 1 TABLET EVERY DAY  . aspirin EC 81 MG EC tablet Take 1 tablet (81 mg total) by mouth daily.  . fenofibrate 160 MG tablet Take 1 tablet (160 mg total) by mouth daily.  . furosemide (LASIX) 20 MG tablet TAKE 1 TABLET EVERY DAY  . gabapentin (NEURONTIN) 400 MG capsule Take 1 capsule (400 mg total) by mouth in the morning and at bedtime.  Marland Kitchen linaclotide (LINZESS) 145 MCG CAPS capsule Take 145 mcg by mouth as needed (for stomach).   . Melatonin 10  MG TABS Take 10 mg by mouth at bedtime.  . metoprolol tartrate (LOPRESSOR) 25 MG tablet TAKE 1 TABLET TWICE DAILY  . oxyCODONE-acetaminophen (PERCOCET) 7.5-325 MG tablet Take 1 tablet by mouth every 8 (eight) hours as needed for severe pain.  . rosuvastatin (CRESTOR) 40 MG tablet Take 1 tablet (40 mg total) by mouth daily.  . tamsulosin (FLOMAX) 0.4 MG CAPS capsule Take 1 capsule (0.4 mg total) by mouth daily.  Marland Kitchen tiZANidine (ZANAFLEX) 4 MG tablet TAKE 1 TABLET EVERY 6 HOURS AS NEEDED FOR MUSCLE SPASM(S)  . valACYclovir (VALTREX) 1000 MG tablet Take 1 tablet (1,000 mg total) by mouth 3 (three) times daily. For 7 days for shingles.   No facility-administered encounter medications on file as of 06/05/2020.    Allergies (verified) Hydralazine, Mirtazapine, and Sinemet [carbidopa-levodopa]   History: Past Medical History:  Diagnosis Date  . Arthritis    DDD- Lower Back  . Bradycardia   . CAP (community acquired pneumonia) 03/17/2015   "THAT'S WHAT THEY ARE THINKING; THEY ARE NOT SURE"  . Carotid artery occlusion   . Chest pain 06/19/2018  . Chronic lower back pain    DDD  . CKD (chronic kidney disease)    Dr. Corliss Parish  . 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 Diverticulosis   . Enlarged prostate    takes Flomax daily  . History of blood transfusion    "probably when they did aortic aneurysm"  . History of colon polyps    benign  . Hyperlipidemia   . Hypertension    takes Amlodipine and Zebeta daily  . Insomnia    takes Melatonin nightly  . Joint pain   . 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     . Muscle spasm    takes Zanaflex daily as needed  . Peripheral edema    takes Furosemide daily  . Pneumonia    hx of   . Rheumatic fever 1946  . Rheumatoid arthritis ([place])   .  Short-term memory loss    minimal  . Shortness of breath dyspnea    with exertion but lasts very short period of time  . TIA (transient ischemic attack)    x 2;takes Plavix daily  . Urinary frequency    takes Flomax daily  . Urinary urgency    Past Surgical History:  Procedure Laterality Date  . ABDOMINAL AORTIC ANEURYSM REPAIR  2010  . CAROTID ENDARTERECTOMY Left 09/20/2004  . CATARACT EXTRACTION, BILATERAL     2016-2017  . COLONOSCOPY    . 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 ENDARTERECTOMY Right 10/12/2015   Procedure: RIGHT CAROTID ENDARTERECTOMY WITH PATCH ANGIOPLASTY;  Surgeon: Elam Dutch, MD;  Location: [facility];  Service: Vascular;  Laterality: Right;  . INGUINAL HERNIA REPAIR Left 02/05/2013   Procedure: HERNIA REPAIR INGUINAL ADULT;  Surgeon: Joyice Faster. Cornett, MD;  Location: [facility];  Service: General;  Laterality: Left;  . INGUINAL HERNIA REPAIR Left   . INSERTION OF MESH Left 02/05/2013   Procedure: INSERTION OF MESH;  Surgeon: Joyice Faster. Cornett, MD;  Location: [facility];  Service: General;  Laterality: Left;  . LEFT HEART CATH AND CORONARY ANGIOGRAPHY N/A 06/21/2018   Procedure: LEFT HEART CATH AND CORONARY ANGIOGRAPHY;  Surgeon: Jettie Booze, MD;  Location: [facility];  Service: Cardiovascular;  Laterality: N/A;  . SHOULDER ARTHROSCOPY WITH ROTATOR CUFF REPAIR AND SUBACROMIAL DECOMPRESSION Left 05/01/2014   Procedure: LEFT ARTHROSCOPY SHOULDER SUBACROMIAL DECOMPRESSION,DISTAL CLAVICAL RESECTION AND ROTATOR CUFF REPAIR;  Surgeon: Marin Shutter, MD;  Location: [facility];  Service: Orthopedics;  Laterality: Left;  . TEE WITHOUT CARDIOVERSION N/A 06/27/2018    Procedure: TRANSESOPHAGEAL ECHOCARDIOGRAM (TEE);  Surgeon: Melrose Nakayama, MD;  Location: [facility];  Service: Open Heart Surgery;  Laterality: N/A;  . TESTICLAR CYST EXCISION Left   . TONSILLECTOMY    . VASECTOMY     Family History  Problem Relation Age of Onset  . Parkinsonism Father   . Dementia Father   . Cancer Father        Throat  . Colon cancer Mother        died in 37s  . Cancer Mother        Colon   Social History   Socioeconomic History  . Marital status: Married    Spouse name: Not on file  . Number of children: Not on file  . Years of education:  Not on file  . Highest education level: Not on file  Occupational History  . Occupation: retired    Comment: maintenance; electrician  Tobacco Use  . Smoking status: Former Smoker    Packs/day: 2.00    Years: 28.00    Pack years: 56.00    Types: Cigarettes    Quit date: 05/31/1987    Years since quitting: 33.0  . Smokeless tobacco: Never Used  . Tobacco comment: quit smoking "in my 40's"  Vaping Use  . Vaping Use: Never used  Substance and Sexual Activity  . Alcohol use: Not Currently    Alcohol/week: 0.0 standard drinks    Comment: rare, mikes hard lemonade  . Drug use: Not Currently    Types: Marijuana    Comment: hx of-4-5 yrs ago  . Sexual activity: Not Currently  Other Topics Concern  . Not on file  Social History Narrative   Married 33 years in 2015. Lived together for 12 years. 2 boys from first wife. 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       Hobbies: yardwork, [place] barn, woodwork, collect knive   Right handed   One story home   Caffeine yes   Social Determinants of Health   Financial Resource Strain: Not on file  Food Insecurity: Not on file  Transportation Needs: Not on file  Physical Activity: Not on file  Stress: Not on file  Social Connections: Not on file     Tobacco Counseling Counseling given: Not Answered Comment: quit smoking "in my 12's"   Clinical Intake:                 Diabetic?No         Activities of Daily Living In your present state of health, do you have any difficulty performing the following activities: 05/12/2020  Hearing? Y  Vision? N  Difficulty concentrating or making decisions? Y  Comment Remembering is the problem  Walking or climbing stairs? Y  Dressing or bathing? Y  Doing errands, shopping? N  Some recent data might be hidden    Patient Care Team: Marin Olp, MD as PCP - General (Family Medicine) Fay Records, MD as PCP - Cardiology (Cardiology) Lyndal Pulley, DO as Consulting Physician (Sports Medicine) Bayard Hugger, NP as Nurse Practitioner (Physical Medicine and Rehabilitation) Macarthur Critchley, [place] as Consulting Physician (Optometry) Tat, Eustace Quail, DO as Consulting Physician (Neurology) Corliss Parish, MD as Consulting Physician (Nephrology)  Indicate any recent Medical Services you may have received from other than [facility] providers in the past year (date may be approximate).     Assessment:   This is a routine wellness examination for [place].  Hearing/Vision screen No exam data present  Dietary issues and exercise activities discussed:    Goals    . Patient Stated     To maintain health     . to develop      May discuss with MD regarding pool exercise or upper body exercise with wife.       Depression Screen PHQ 2/9 Scores 05/11/2020 04/09/2020 03/11/2020 01/09/2020 12/13/2019 11/14/2019 05/09/2019  PHQ - 2 Score 0 2 0 0 0 0 2  PHQ- 9 Score - - - - - - 8    Fall Risk Fall Risk  05/11/2020 04/09/2020 03/11/2020 02/10/2020 01/09/2020  Falls in the  past year? 1 0 0 0 1  Comment - - - - July 2021 at home  Number falls in past yr: 1 0 0 - 1  Comment last fall reported 11/2019 - - - -  Injury with Fall? 0 0 0 - 0  Comment - - - - -  Risk Factor Category   Moderate Risk (1 Point) - - - -  Risk for fall due to : History of fall(s) - - - -  Follow up - - - - -  Comment - - - - -    FALL RISK PREVENTION PERTAINING TO THE HOME:  Any stairs in or around the home? No  If so, are there any without handrails? No  Home [name] of loose throw rugs in walkways, pet beds, electrical cords, etc? Yes  Adequate lighting in your home to reduce risk of falls? Yes   ASSISTIVE DEVICES UTILIZED TO PREVENT FALLS:  Life alert? No  Use of a cane, walker or w/c? Yes  Grab bars in the bathroom? Yes  Shower chair or bench in shower? Yes  Elevated toilet seat or a handicapped toilet? Yes   TIMED UP AND GO:  Was the test performed? Yes .  Length of time to ambulate 10 feet: 20 sec.   Gait slow and steady with assistive device  Cognitive Function: MMSE - Mini Mental State Exam 10/05/2017  Not completed: (No Data)        Immunizations Immunization History  Administered Date(s) Administered  . Influenza Whole 05/30/1997  . PFIZER SARS-COV-2 Vaccination 07/26/2019, 10/08/2019  . Pneumococcal Conjugate-13 12/22/2014  . Pneumococcal Polysaccharide-23 03/28/2013  . Td 05/30/1994, 02/05/2007, 07/06/2017    TDAP status: Up to date  Flu Vaccine status: Declined, Education has been provided regarding the importance of this vaccine but patient still declined. Advised may receive this vaccine at local pharmacy or Health Dept. Aware to provide a copy of the vaccination record if obtained from local pharmacy or Health Dept. Verbalized acceptance and understanding.  Pneumococcal vaccine status: Up to date  Covid-19 vaccine status: Completed vaccines Booster recommended   Qualifies for Shingles Vaccine? Yes   Zostavax completed No   Shingrix Completed?: No.    Education has been provided regarding the importance of this vaccine. Patient has been advised to call insurance company to determine out of pocket expense if they have not yet received this vaccine.  Advised may also receive vaccine at local pharmacy or Health Dept. Verbalized acceptance and understanding.  Screening Tests Health Maintenance  Topic Date Due  . COVID-19 Vaccine (3 - Pfizer risk 4-dose series) 11/05/2019  . INFLUENZA VACCINE  08/27/2020 (Originally 12/29/2019)  . TETANUS/TDAP  07/07/2027  . PNA vac Low Risk Adult  Completed    Health Maintenance  Health Maintenance Due  Topic Date Due  . COVID-19 Vaccine (3 - Pfizer risk 4-dose series) 11/05/2019    Colorectal cancer screening: No longer required.     Additional Screening:    Vision Screening: Recommended annual ophthalmology exams for early detection of glaucoma and other disorders of the eye. Is the patient up to date with their annual eye exam?  Yes  Who is the provider or what is the name of the office in which the patient attends annual eye exams? [facility]   Dental Screening: Recommended annual dental exams for proper oral hygiene  Community Resource Referral / Chronic Care Management: CRR required this visit?  No   CCM required this visit?  No  Plan:     I have personally reviewed and noted the following in the patient's chart:   . Medical and social history . Use of alcohol, tobacco or illicit drugs  . Current medications and supplements . Functional ability and status . Nutritional status . Physical activity . Advanced directives . List of other physicians . Hospitalizations, surgeries, and ER visits in previous 12 months . Vitals . Screenings to include cognitive, depression, and falls . Referrals and appointments  In addition, I have reviewed and discussed with patient certain preventive protocols, quality metrics, and best practice recommendations. A written personalized care plan for preventive services as well as general preventive health recommendations were provided to patient.     Willette Brace, LPN   06/04/1094   Nurse Notes: None

## 2020-06-05 NOTE — Patient Instructions (Signed)
[name] , Thank you for taking time to come for your Medicare Wellness Visit. I appreciate your ongoing commitment to your health goals. Please review the following plan we discussed and let me know if I can assist you in the future.   Screening recommendations/referrals: Colonoscopy: Done 11/29/19 Recommended yearly ophthalmology/optometry visit for glaucoma screening and checkup Recommended yearly dental visit for hygiene and checkup  Vaccinations: Influenza vaccine: Postponed until 07/2020 Pneumococcal vaccine: Up to date Tdap vaccine: Up to date Shingles vaccine: Shingrix discussed. Please contact your pharmacy for coverage information.    Covid-19: Completed 07/26/19 & 09/28/19  Advanced directives: Please bring a copy of your health care power of attorney and living will to the office at your convenience.  Conditions/risks identified: None at this time  Next appointment: Follow up in one year for your annual wellness visit.   Preventive Care 82 Years and Older, Male Preventive care refers to lifestyle choices and visits with your health care provider that can promote health and wellness. What does preventive care include?  A yearly physical exam. This is also called an annual well check.  Dental exams once or twice a year.  Routine eye exams. Ask your health care provider how often you should have your eyes checked.  Personal lifestyle choices, including:  Daily care of your teeth and gums.  Regular physical activity.  Eating a healthy diet.  Avoiding tobacco and drug use.  Limiting alcohol use.  Practicing safe sex.  Taking low doses of aspirin every day.  Taking vitamin and mineral supplements as recommended by your health care provider. What happens during an annual well check? The services and screenings done by your health care provider during your annual well check will depend on your age, overall health, lifestyle risk factors, and family history of  disease. Counseling  Your health care provider may ask you questions about your:  Alcohol use.  Tobacco use.  Drug use.  Emotional well-being.  Home and relationship well-being.  Sexual activity.  Eating habits.  History of falls.  Memory and ability to understand (cognition).  Work and work Statistician. Screening  You may have the following tests or measurements:  Height, weight, and BMI.  Blood pressure.  Lipid and cholesterol levels. These may be checked every 5 years, or more frequently if you are over 67 years old.  Skin check.  Lung cancer screening. You may have this screening every year starting at age 20 if you have a 30-pack-year history of smoking and currently smoke or have quit within the past 15 years.  Fecal occult blood test (FOBT) of the stool. You may have this test every year starting at age 59.  Flexible sigmoidoscopy or colonoscopy. You may have a sigmoidoscopy every 5 years or a colonoscopy every 10 years starting at age 73.  Prostate cancer screening. Recommendations will vary depending on your family history and other risks.  Hepatitis C blood test.  Hepatitis B blood test.  Sexually transmitted disease (STD) testing.  Diabetes screening. This is done by checking your blood sugar (glucose) after you have not eaten for a while (fasting). You may have this done every 1-3 years.  Abdominal aortic aneurysm (AAA) screening. You may need this if you are a current or former smoker.  Osteoporosis. You may be screened starting at age 16 if you are at high risk. Talk with your health care provider about your test results, treatment options, and if necessary, the need for more tests. Vaccines  Your  health care provider may recommend certain vaccines, such as:  Influenza vaccine. This is recommended every year.  Tetanus, diphtheria, and acellular pertussis (Tdap, Td) vaccine. You may need a Td booster every 10 years.  Zoster vaccine. You may  need this after age 61.  Pneumococcal 13-valent conjugate (PCV13) vaccine. One dose is recommended after age 47.  Pneumococcal polysaccharide (PPSV23) vaccine. One dose is recommended after age 73. Talk to your health care provider about which screenings and vaccines you need and how often you need them. This information is not intended to replace advice given to you by your health care provider. Make sure you discuss any questions you have with your health care provider. Document Released: 06/12/2015 Document Revised: 02/03/2016 Document Reviewed: 03/17/2015 Elsevier Interactive Patient Education  2017 [place] Prevention in the Home Falls can cause injuries. They can happen to people of all ages. There are many things you can do to make your home safe and to help prevent falls. What can I do on the outside of my home?  Regularly fix the edges of walkways and driveways and fix any cracks.  Remove anything that might make you trip as you walk through a door, such as a raised step or threshold.  Trim any bushes or trees on the path to your home.  Use bright outdoor lighting.  Clear any walking paths of anything that might make someone trip, such as rocks or tools.  Regularly check to see if handrails are loose or broken. Make sure that both sides of any steps have handrails.  Any raised decks and porches should have guardrails on the edges.  Have any leaves, snow, or ice cleared regularly.  Use sand or salt on walking paths during winter.  Clean up any spills in your garage right away. This includes oil or grease spills. What can I do in the bathroom?  Use night lights.  Install grab bars by the toilet and in the tub and shower. Do not use towel bars as grab bars.  Use non-skid mats or decals in the tub or shower.  If you need to sit down in the shower, use a plastic, non-slip stool.  Keep the floor dry. Clean up any water that spills on the floor as soon as it  happens.  Remove soap buildup in the tub or shower regularly.  Attach bath mats securely with double-sided non-slip rug tape.  Do not have throw rugs and other things on the floor that can make you trip. What can I do in the bedroom?  Use night lights.  Make sure that you have a light by your bed that is easy to reach.  Do not use any sheets or blankets that are too big for your bed. They should not hang down onto the floor.  Have a firm chair that has side arms. You can use this for support while you get dressed.  Do not have throw rugs and other things on the floor that can make you trip. What can I do in the kitchen?  Clean up any spills right away.  Avoid walking on wet floors.  Keep items that you use a lot in easy-to-reach places.  If you need to reach something above you, use a strong step stool that has a grab bar.  Keep electrical cords out of the way.  Do not use floor polish or wax that makes floors slippery. If you must use wax, use non-skid floor wax.  Do not  have throw rugs and other things on the floor that can make you trip. What can I do with my stairs?  Do not leave any items on the stairs.  Make sure that there are handrails on both sides of the stairs and use them. Fix handrails that are broken or loose. Make sure that handrails are as long as the stairways.  Check any carpeting to make sure that it is firmly attached to the stairs. Fix any carpet that is loose or worn.  Avoid having throw rugs at the top or bottom of the stairs. If you do have throw rugs, attach them to the floor with carpet tape.  Make sure that you have a light switch at the top of the stairs and the bottom of the stairs. If you do not have them, ask someone to add them for you. What else can I do to help prevent falls?  Wear shoes that:  Do not have high heels.  Have rubber bottoms.  Are comfortable and fit you well.  Are closed at the toe. Do not wear sandals.  If you  use a stepladder:  Make sure that it is fully opened. Do not climb a closed stepladder.  Make sure that both sides of the stepladder are locked into place.  Ask someone to hold it for you, if possible.  Clearly mark and make sure that you can see:  Any grab bars or handrails.  First and last steps.  Where the edge of each step is.  Use tools that help you move around (mobility aids) if they are needed. These include:  Canes.  Walkers.  Scooters.  Crutches.  Turn on the lights when you go into a dark area. Replace any light bulbs as soon as they burn out.  Set up your furniture so you have a clear path. Avoid moving your furniture around.  If any of your floors are uneven, fix them.  If there are any pets around you, be aware of where they are.  Review your medicines with your doctor. Some medicines can make you feel dizzy. This can increase your chance of falling. Ask your doctor what other things that you can do to help prevent falls. This information is not intended to replace advice given to you by your health care provider. Make sure you discuss any questions you have with your health care provider. Document Released: 03/12/2009 Document Revised: 10/22/2015 Document Reviewed: 06/20/2014 Elsevier Interactive Patient Education  2017 [employer].

## 2020-06-05 NOTE — Addendum Note (Signed)
Addended by: Adah Salvage F on: 06/05/2020 03:47 PM   Modules accepted: Orders

## 2020-06-07 ENCOUNTER — Other Ambulatory Visit: Payer: Self-pay | Admitting: Family Medicine

## 2020-06-07 LAB — URINE CULTURE
MICRO NUMBER:: 11394485
SPECIMEN QUALITY:: ADEQUATE

## 2020-06-07 MED ORDER — AMOXICILLIN-POT CLAVULANATE 500-125 MG PO TABS: 1.0000 | ORAL_TABLET | Freq: Two times a day (BID) | ORAL | 0 refills | Status: DC

## 2020-06-08 NOTE — Progress Notes (Signed)
Cognitive completed pt 6CIT score was 6 missed address number and a few months

## 2020-06-11 ENCOUNTER — Encounter: Payer: Self-pay | Admitting: Registered Nurse

## 2020-06-11 ENCOUNTER — Other Ambulatory Visit: Payer: Self-pay

## 2020-06-11 ENCOUNTER — Encounter: Payer: Medicare HMO | Attending: Physical Medicine & Rehabilitation | Admitting: Registered Nurse

## 2020-06-11 ENCOUNTER — Encounter: Payer: Medicare HMO | Admitting: Registered Nurse

## 2020-06-11 VITALS — BP 113/59 | HR 76 | Temp 97.9°F | Ht 67.0 in | Wt 163.0 lb

## 2020-06-11 DIAGNOSIS — Z79891 Long term (current) use of opiate analgesic: Secondary | ICD-10-CM

## 2020-06-11 DIAGNOSIS — M47816 Spondylosis without myelopathy or radiculopathy, lumbar region: Secondary | ICD-10-CM

## 2020-06-11 DIAGNOSIS — M79605 Pain in left leg: Secondary | ICD-10-CM | POA: Diagnosis not present

## 2020-06-11 DIAGNOSIS — M1712 Unilateral primary osteoarthritis, left knee: Secondary | ICD-10-CM

## 2020-06-11 DIAGNOSIS — Z5181 Encounter for therapeutic drug level monitoring: Secondary | ICD-10-CM

## 2020-06-11 DIAGNOSIS — G894 Chronic pain syndrome: Secondary | ICD-10-CM | POA: Diagnosis not present

## 2020-06-11 DIAGNOSIS — R54 Age-related physical debility: Secondary | ICD-10-CM

## 2020-06-11 MED ORDER — OXYCODONE-ACETAMINOPHEN 7.5-325 MG PO TABS: 1.0000 | ORAL_TABLET | Freq: Three times a day (TID) | ORAL | 0 refills | Status: DC | PRN

## 2020-06-11 NOTE — Progress Notes (Signed)
Subjective:    Patient ID: Kevin [name], male    DOB: Aug 26, 1940, 80 y.o.   MRN: [id number]  HPI: Kevin [name] is a 80 y.o. male whose appointment was changed to a My-Chart Video Visit. Mr. Amparan called office reporting he has diarrhea, nausea and headache. Also states he was having urinary symptoms, he was in contact with his PCP. He was prescribed a antibiotic, also reports he has a scheduled appointment with PCP next week. . His office appointment was changed to a My-Chart visit, he agrees with My-Chart visit and verbalizes understanding. He states his pain is located in his lower back and left lower extremity. He rates his pain 9. His  current exercise regime is walking short distances in the home.   Mr. Escher Morphine equivalent is 28.89 MME.   Last Oral Swab was Performed on 04/09/2020.     Pain Inventory Average Pain 8 Pain Right Now 9 My pain is constant, sharp, burning, stabbing, tingling and aching  In the last 24 hours, has pain interfered with the following? General activity 8 Relation with others 9 Enjoyment of life 10 What TIME of day is your pain at its worst? daytime, evening and night Sleep (in general) Fair  Pain is worse with: walking, bending, standing and some activites Pain improves with: rest, heat/ice, medication and injections Relief from Meds: 5  Family History  Problem Relation Age of Onset  . Parkinsonism Father   . Dementia Father   . Cancer Father        Throat  . Colon cancer Mother        died in 38s  . Cancer Mother        Colon   Social History   Socioeconomic History  . Marital status: Married    Spouse name: Not on file  . Number of children: Not on file  . Years of education: Not on file  . Highest education level: Not on file  Occupational History  . Occupation: retired    Comment: maintenance; electrician  Tobacco Use  . Smoking status: Former Smoker    Packs/day: 2.00    Years: 28.00    Pack years: 56.00    Types: Cigarettes     Quit date: 05/31/1987    Years since quitting: 33.0  . Smokeless tobacco: Never Used  . Tobacco comment: quit smoking "in my 40's"  Vaping Use  . Vaping Use: Never used  Substance and Sexual Activity  . Alcohol use: Not Currently    Alcohol/week: 0.0 standard drinks    Comment: rare, mikes hard lemonade  . Drug use: Not Currently    Types: Marijuana    Comment: hx of-4-5 yrs ago  . Sexual activity: Not Currently  Other Topics Concern  . Not on file  Social History Narrative   Married 33 years in 2015. Lived together for 12 years. 2 boys from first wife. 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       Hobbies: yardwork, [place] barn, woodwork, collect knive   Right handed   One story home   Caffeine yes   Social Determinants of Health   Financial Resource Strain: Low Risk   . Difficulty of Paying Living Expenses: Not hard at all  Food Insecurity: No Food Insecurity  . Worried About Charity fundraiser in  the Last Year: Never true  . Ran Out of Food in the Last Year: Never true  Transportation Needs: No Transportation Needs  . Lack of Transportation (Medical): No  . Lack of Transportation (Non-Medical): No  Physical Activity: Inactive  . Days of Exercise per Week: 0 days  . Minutes of Exercise per Session: 0 min  Stress: No Stress Concern Present  . Feeling of Stress : Not at all  Social Connections: Moderately Isolated  . Frequency of Communication with Friends and Family: More than three times a week  . Frequency of Social Gatherings with Friends and Family: Once a week  . Attends Religious Services: Never  . Active Member of Clubs or Organizations: No  . Attends Archivist Meetings: Never  . Marital Status: Married   Past Surgical History:  Procedure Laterality Date  . ABDOMINAL AORTIC ANEURYSM REPAIR  2010  . CAROTID ENDARTERECTOMY Left  09/20/2004  . CATARACT EXTRACTION, BILATERAL     2016-2017  . COLONOSCOPY    . 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 ENDARTERECTOMY Right 10/12/2015   Procedure: RIGHT CAROTID ENDARTERECTOMY WITH PATCH ANGIOPLASTY;  Surgeon: Elam Dutch, MD;  Location: [facility];  Service: Vascular;  Laterality: Right;  . INGUINAL HERNIA REPAIR Left 02/05/2013   Procedure: HERNIA REPAIR INGUINAL ADULT;  Surgeon: Joyice Faster. Cornett, MD;  Location: [facility];  Service: General;  Laterality: Left;  . INGUINAL HERNIA REPAIR Left   . INSERTION OF MESH Left 02/05/2013   Procedure: INSERTION OF MESH;  Surgeon: Joyice Faster. Cornett, MD;  Location: [facility];  Service: General;  Laterality: Left;  . LEFT HEART CATH AND CORONARY ANGIOGRAPHY N/A 06/21/2018   Procedure: LEFT HEART CATH AND CORONARY ANGIOGRAPHY;  Surgeon: Jettie Booze, MD;  Location: [facility];  Service: Cardiovascular;  Laterality: N/A;  . SHOULDER ARTHROSCOPY WITH ROTATOR CUFF REPAIR AND SUBACROMIAL DECOMPRESSION Left 05/01/2014   Procedure: LEFT ARTHROSCOPY SHOULDER SUBACROMIAL DECOMPRESSION,DISTAL CLAVICAL RESECTION AND ROTATOR CUFF REPAIR;  Surgeon: Marin Shutter, MD;  Location: [facility];  Service: Orthopedics;  Laterality: Left;  . TEE WITHOUT CARDIOVERSION N/A 06/27/2018   Procedure: TRANSESOPHAGEAL ECHOCARDIOGRAM (TEE);  Surgeon: Melrose Nakayama, MD;  Location: [facility];  Service: Open Heart Surgery;  Laterality: N/A;  . TESTICLAR CYST EXCISION Left   . TONSILLECTOMY    . VASECTOMY     Past Surgical History:  Procedure Laterality Date  . ABDOMINAL AORTIC ANEURYSM REPAIR  2010  . CAROTID ENDARTERECTOMY Left 09/20/2004  . CATARACT  EXTRACTION, BILATERAL     2016-2017  . COLONOSCOPY    . 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 ENDARTERECTOMY Right 10/12/2015   Procedure: RIGHT CAROTID ENDARTERECTOMY WITH PATCH ANGIOPLASTY;  Surgeon: Elam Dutch, MD;  Location: [facility];  Service: Vascular;  Laterality: Right;  . INGUINAL HERNIA REPAIR Left 02/05/2013   Procedure: HERNIA REPAIR INGUINAL ADULT;  Surgeon: Joyice Faster. Cornett, MD;  Location: [facility];  Service: General;  Laterality: Left;  . INGUINAL HERNIA REPAIR Left   . INSERTION OF MESH Left 02/05/2013   Procedure: INSERTION OF MESH;  Surgeon: Joyice Faster. Cornett, MD;  Location: [facility];  Service: General;  Laterality: Left;  . LEFT HEART CATH AND CORONARY ANGIOGRAPHY N/A 06/21/2018   Procedure: LEFT HEART CATH AND CORONARY ANGIOGRAPHY;  Surgeon: Jettie Booze, MD;  Location: [facility];  Service: Cardiovascular;  Laterality: N/A;  . SHOULDER ARTHROSCOPY WITH ROTATOR CUFF REPAIR AND SUBACROMIAL DECOMPRESSION Left 05/01/2014   Procedure: LEFT ARTHROSCOPY SHOULDER SUBACROMIAL DECOMPRESSION,DISTAL CLAVICAL RESECTION AND ROTATOR CUFF REPAIR;  Surgeon: Marin Shutter, MD;  Location: [facility];  Service: Orthopedics;  Laterality: Left;  . TEE WITHOUT CARDIOVERSION N/A 06/27/2018   Procedure: TRANSESOPHAGEAL ECHOCARDIOGRAM (TEE);  Surgeon: Melrose Nakayama, MD;  Location: [facility];  Service: Open Heart Surgery;  Laterality: N/A;  . TESTICLAR CYST EXCISION Left   . TONSILLECTOMY    . VASECTOMY     Past Medical History:  Diagnosis Date  . Arthritis    DDD- Lower Back  . Bradycardia   . CAP (community acquired pneumonia) 03/17/2015   "THAT'S WHAT THEY ARE THINKING;  THEY ARE NOT SURE"  . Carotid artery occlusion   . Chest pain 06/19/2018  . Chronic lower back pain    DDD  . CKD (chronic kidney disease)    Dr. Corliss Parish  . 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 Diverticulosis   . Enlarged prostate    takes Flomax daily  . History of blood transfusion    "probably when they did aortic aneurysm"  . History of colon polyps    benign  . Hyperlipidemia   . Hypertension    takes Amlodipine and Zebeta daily  . Insomnia    takes Melatonin nightly  . Joint pain   . 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     . Muscle spasm    takes Zanaflex daily as needed  . Peripheral edema    takes Furosemide daily  . Pneumonia    hx of   . Rheumatic fever 1946  . Rheumatoid arthritis ([place])   . Short-term memory loss    minimal  . Shortness of breath dyspnea    with exertion but lasts very short period of time  . TIA (transient ischemic attack)    x 2;takes Plavix daily  . Urinary frequency    takes Flomax daily  . Urinary urgency    BP (!) 113/59 Comment: reported  Pulse 76 Comment: reported  Temp 97.9 F (36.6 C) Comment: reported  Ht 5\' 7"  (1.702 m) Comment: last recorded  Wt 163 lb (73.9 kg) Comment: last recorded  BMI 25.53 kg/m   Opioid Risk Score:   Fall Risk Score:  `1  Depression screen PHQ 2/9  Depression screen [facility] 2/9 06/11/2020 06/05/2020 05/11/2020 04/09/2020 03/11/2020 01/09/2020 12/13/2019  Decreased Interest 0 0 0 1 0 0 0  Down, Depressed, Hopeless 0 0 0 1 0 0 0  PHQ - 2 Score 0 0 0 2 0 0 0  Altered sleeping - - - - - - -  Tired, decreased energy - - - - - - -  Change in appetite - - - - - - -  Feeling bad or failure  about yourself  - - - - - - -  Trouble concentrating - - - - - - -  Moving slowly or fidgety/restless - - - - -  - -  PHQ-9 Score - - - - - - -  Some recent data might be hidden   Review of Systems  Constitutional: Negative.   HENT: Negative.   Eyes: Negative.   Respiratory: Negative.   Cardiovascular: Negative.   Gastrointestinal: Negative.   Endocrine: Negative.   Genitourinary: Positive for frequency.       Being treated with antibiotics for UTI  Musculoskeletal: Positive for back pain and gait problem.  Skin: Negative.   Allergic/Immunologic: Negative.   Neurological: Positive for weakness.  Hematological: Negative.   Psychiatric/Behavioral: Negative.   All other systems reviewed and are negative.      Objective:   Physical Exam Vitals and nursing note reviewed.  Musculoskeletal:     Comments: My- Chart Visit : No Physical Assessment Performed           Assessment & Plan:  1. Lumbar Spondylosis/ Lumbar Stenosis:101/13/2022 Continue current medication regime. Refilled:Oxycodone 7.5/325 mg one tabletevery 8 hoursas needed #80.06/11/2020 We will continue the opioid monitoring program, this consists of regular clinic visits, examinations, urine drug screen, pill counts as well as use of [place] Controlled Substance Reporting system. A 12 month History has been reviewed on the [place] Controlled Substance Reporting Systemon01/13/2022. 2.Bilateral Knees OA/ChronicBilateralKnee Pain:No complaints todat.S/PBilateral KneeInjection by DrSmithon 12/09/2019, Orthopedist Following01/13/2022. 3.BilateralGreater Trochanter Bursitis:No complaints today.Orthopedist Following.Continue to alternate Ice and Heat Therapy.06/11/2020 4. Chronic Left Leg Pain: Continue Current Medication Regimen. Continue to Monitor.    F/U in 1 month  My Chart Video Visit Established Patient Location of Patient: In his Home Location of Provider: In the Office

## 2020-06-17 NOTE — Progress Notes (Signed)
Phone [phone] In person visit   Subjective:   Kevin [name] is a 80 y.o. year old very pleasant male patient who presents for/with See problem oriented charting Chief Complaint  Patient presents with  . Flank Pain    Left side. Patient states that he's had antibiotics for 2 months straight and nothings helped. Everybody  telling him it's a kidney infection but nothings helping it. He states that the pain radiates up to his rig cage and it can become so unbearable that he can't stand   . Anorexia  . Chest Pain  . Fatigue    This visit occurred during the SARS-CoV-2 public health emergency.  Safety protocols were in place, including screening questions prior to the visit, additional usage of staff PPE, and extensive cleaning of exam room while observing appropriate contact time as indicated for disinfecting solutions.   Past Medical History-  Patient Active Problem List   Diagnosis Date Noted  . Parkinson's disease ([place]) 11/18/2019    Priority: High  . Afib ([place]) 07/06/2018    Priority: High  . 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 PAD (peripheral artery disease) ([place]) 01/08/2016    Priority: High  . Diastolic CHF ([place]) 62/22/9798    Priority: High  . CKD (chronic kidney disease), stage IV ([place]) 01/27/2014    Priority: High  . Carotid artery stenosis s/p L carotid endarterectomy 01/12/2012    Priority: High  . History of CVA (cerebrovascular accident) 11/17/2011    Priority: High  . Chronic pain syndrome 12/23/2009    Priority: High  . Constipation 09/19/2017    Priority: Medium  . Personal history of colonic polyps 05/15/2013    Priority: Medium  . Essential tremor 10/05/2009    Priority: Medium  . UNSPECIFIED ANEMIA 12/05/2008    Priority: Medium  . BPH (benign prostatic hyperplasia) 06/04/2007    Priority: Medium  . Fasting hyperglycemia 02/05/2007    Priority: Medium  . Hyperlipidemia 01/03/2007    Priority: Medium  . Essential hypertension 01/03/2007     Priority: Medium  . Hemiparesis ([place]) 04/16/2019    Priority: Low  . CAP (community acquired pneumonia) 03/17/2015    Priority: Low  . Family history of malignant neoplasm of gastrointestinal tract 05/15/2013    Priority: Low  . Inguinal hernia 05/01/2012    Priority: Low  . BURSITIS, HIP 12/21/2009    Priority: Low  . ACTINIC KERATOSIS, EAR, LEFT 03/09/2009    Priority: Low  . 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 Left shoulder pain 02/04/2020  . Loss of weight 10/08/2019  . Greater trochanteric bursitis of both hips 02/26/2019  . Degenerative arthritis of knee, bilateral 01/14/2019  . SIRS (systemic inflammatory response syndrome) ([place]) 01/08/2019  . Chest pain 01/08/2019  . Atelectasis of left lung 10/30/2018  . Lumbar spondylosis 03/17/2017    Medications- reviewed and updated Current Outpatient Medications  Medication Sig Dispense Refill  . albuterol (VENTOLIN HFA) 108 (90 Base) MCG/ACT inhaler Inhale 2 puffs into the lungs every 12 (twelve) hours as needed for wheezing or shortness of breath. 1 each 6  . amLODipine (NORVASC) 10 MG tablet TAKE 1 TABLET EVERY DAY 90 tablet 1  . aspirin EC 81 MG tablet Take 81 mg by mouth daily. Swallow whole.    . fenofibrate 160 MG tablet Take 1 tablet (160 mg total) by mouth daily. 90 tablet  3  . furosemide (LASIX) 20 MG tablet TAKE 1 TABLET EVERY DAY 90 tablet 1  . gabapentin (NEURONTIN) 400 MG capsule Take 1 capsule (400 mg total) by mouth in the morning and at bedtime. 180 capsule 3  . linaclotide (LINZESS) 145 MCG CAPS capsule Take 145 mcg by mouth as needed (for stomach).     . Melatonin 10 MG TABS Take 10 mg by mouth at bedtime.    . metoprolol tartrate (LOPRESSOR) 25 MG tablet TAKE 1 TABLET TWICE DAILY 180 tablet 3  . oxyCODONE-acetaminophen (PERCOCET) 7.5-325 MG tablet Take 1 tablet by mouth every 8 (eight) hours as needed for severe pain. 80 tablet  0  . rosuvastatin (CRESTOR) 40 MG tablet Take 1 tablet (40 mg total) by mouth daily. 90 tablet 3  . tamsulosin (FLOMAX) 0.4 MG CAPS capsule Take 1 capsule (0.4 mg total) by mouth daily. 90 capsule 3  . tiZANidine (ZANAFLEX) 4 MG tablet TAKE 1 TABLET EVERY 6 HOURS AS NEEDED FOR MUSCLE SPASM(S) 90 tablet 1  . valACYclovir (VALTREX) 1000 MG tablet Take 1 tablet (1,000 mg total) by mouth 3 (three) times daily. For 7 days for shingles. 21 tablet 0   No current facility-administered medications for this visit.     Objective:  BP 122/72   Pulse 63   Temp (!) 97.3 F (36.3 C) (Temporal)   Ht 5\' 7"  (1.702 m)   Wt 158 lb 9.6 oz (71.9 kg)   SpO2 95%   BMI 24.84 kg/m  Gen: NAD, resting comfortably, appears more fatigued than normal CV: RRR no murmurs rubs or gallops Lungs: CTAB no crackles, wheeze, rhonchi Abdomen: soft/very tender in LLQ- also radiates around to left flank/nondistended/normal bowel sounds. No rebound or guarding.  Ext: no edema Skin: warm, dry  EKG: sinus rhythm with rate 64, left axis, normal intervals, no hypertrophy, no significant st or t wave changes- movement on EKG noted- also has several slight ST elevations but appear to be less than 1 box     Assessment and Plan   # LLQ pain/left flank pain S:patient with left lower quadrant pain radiating around to left low back possibly back to November. Last scan 03/03/20 but that was before this started.    Treated for proteus UTI in October and December. Most recenlty #s were improved down to 10k-49k from 100k but treating with different antibiotic augmentin at this point- he has failed to note improvement in symptoms.   Symptoms have worsened in last month and has noted much lower appetite. We cannot do CT with contrast due to CKD III/IV borderline.   No blood in stool or melena. Did have elevated WBC count last month- we will reassess today. Bowel movements about twice a wek- linzess if needed.   Had considered even  going to the hospital. He is feeling some fatigue and left sided chest aching or right sided aching. Does have CAD history and states this does not feel the same as prior cardiac symptoms. Denies worsening with exertion. Mild ache that feels muscular- hurts worse when pushes on it. Off and on for last month- does not hurt today unless aches  Having some congestion in AM and coughs up some- gets better as day goes on. Did not report improvement on augmentin A/P: unclear cause of LLQ pain but patient does not seem like himself today- low appetite, 5 lb weight loss. We will update labs as below and get CT scan. Get CXR with overall fatigue, cough, feeling winded,  reported chest pain though seems MSK (did still recommend cardiology follow up).  -difficult to clear UTI- may need 14 day course if not clear on urine culture today though I do not suspect htat is the cause of his pain -treatment will depend on bloodwork   #hypertension S: medication: Metoprolol 25Mg  BID, amlodipine 10Mg , lasix 20mg  daily BP Readings from Last 3 Encounters:  06/19/20 122/72  06/11/20 (!) 113/59  06/05/20 (!) 142/70  A/P: blood pressure well controlled- continue current medicine  #CAD #hyperlipidemia S: Medication: Rosuvastatin 40 mg, aspirin 81 mg (appears to have erroneously been removed from medication list and I added this back-I will need to make sure patient is on this at next visit) Lab Results  Component Value Date   CHOL 104 05/12/2020   HDL 56 05/12/2020   LDLCALC 34 05/12/2020   LDLDIRECT 134.9 12/26/2012   TRIG 68 05/12/2020   CHOLHDL 1.9 05/12/2020   A/P: Lipids have been appropriately controlled  For CAD-patient is reporting some chest pain though not exertional as feels muscular to patient and not similar to prior chest pain with CAD.  EKG largely stable.  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  Also more tremulous on the side with Parkinson's  CKD (chronic kidney disease), stage IV ([place]), Chronic-ongoing issues- appears stable on BMP today  PAD (peripheral artery disease) ([place]), Chronic-patient rather sedentary but no reported claudication.  Continue aspirin and statin  Atrial fibrillation, unspecified type ([place]), Chronic- this only occurred after CABG-cardiology states since no recurrence no need for long-term anticoagulation.  Patient is on beta-blocker which would offer some rate control if needed  Recommended follow up: Follow-up to be decided after visit today Future Appointments  Date Time Provider [place]  06/30/2020  9:30 AM Lyndal Pulley, DO [facility] None    Lab/Order associations:   ICD-10-CM   1. Left lower quadrant abdominal pain  R10.32 CT Abdomen Pelvis Wo Contrast    CBC with Differential/Platelet    Comprehensive metabolic panel    Urine Culture    Urine Culture    Comprehensive metabolic panel    CBC with Differential/Platelet    CANCELED: CT Abdomen Pelvis Wo Contrast    CANCELED: CBC with Differential/Platelet    CANCELED: Comprehensive metabolic panel    CANCELED: Urine Culture    CANCELED: CBC with Differential/Platelet    CANCELED: Comprehensive metabolic panel    CANCELED: Urine Culture  2. Essential hypertension  I10   3. Chronic diastolic congestive heart failure (HCC)  I50.32 B Nat Peptide    B Nat Peptide    CANCELED: B Nat Peptide  4. Parkinson's disease ([place]) Chronic G20   5. 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 CKD (chronic kidney disease), stage IV (H[facility]) Chronic N18.4   7. PAD (peripheral artery disease) (H[facility]) Chronic I73.9   8. Atrial fibrillation, unspecified type (H[facility]) Chronic I48.91   9. Chest pain, unspecified type  R07.9 DG Chest 2 View    EKG 12-Lead  10. Left flank pain  R10.9 CT  Abdomen Pelvis Wo Contrast    CBC with Differential/Platelet    Comprehensive metabolic panel    Comprehensive metabolic panel    CBC with Differential/Platelet    CANCELED: CT Abdomen Pelvis Wo Contrast    CANCELED: CBC with  Differential/Platelet    CANCELED: Comprehensive metabolic panel    CANCELED: CBC with Differential/Platelet    CANCELED: Comprehensive metabolic panel   Return precautions advised.  Garret Reddish, MD

## 2020-06-17 NOTE — Patient Instructions (Addendum)
Please stop by lab before you go as well as x-ray If you have mychart- we will send your results within 3 business days of [place] receiving them.  If you do not have mychart- we will call you about results within 5 business days of [place] receiving them.  *please also note that you will see labs on mychart as soon as they post. I will later go in and write notes on them- will say "notes from Dr. [name]"  We will call you within two weeks about your referral to CT. If you do not hear within 2 weeks, give [place] a call.   With chest pain I would like for you to follow up with cardiology- I do not see acute changes on ekg to send you to hospital now but I fyou develop worsening pain or shortnss of breath please seek care

## 2020-06-19 ENCOUNTER — Encounter: Payer: Self-pay | Admitting: Family Medicine

## 2020-06-19 ENCOUNTER — Ambulatory Visit (INDEPENDENT_AMBULATORY_CARE_PROVIDER_SITE_OTHER): Payer: Medicare HMO | Admitting: Family Medicine

## 2020-06-19 ENCOUNTER — Ambulatory Visit (INDEPENDENT_AMBULATORY_CARE_PROVIDER_SITE_OTHER): Payer: Medicare HMO

## 2020-06-19 ENCOUNTER — Other Ambulatory Visit: Payer: Self-pay

## 2020-06-19 VITALS — BP 122/72 | HR 63 | Temp 97.3°F | Ht 67.0 in | Wt 158.6 lb

## 2020-06-19 DIAGNOSIS — I10 Essential (primary) hypertension: Secondary | ICD-10-CM

## 2020-06-19 DIAGNOSIS — R109 Unspecified abdominal pain: Secondary | ICD-10-CM | POA: Diagnosis not present

## 2020-06-19 DIAGNOSIS — I739 Peripheral vascular disease, unspecified: Secondary | ICD-10-CM

## 2020-06-19 DIAGNOSIS — I69852 Hemiplegia and hemiparesis following other cerebrovascular disease affecting left dominant side: Secondary | ICD-10-CM | POA: Diagnosis not present

## 2020-06-19 DIAGNOSIS — I5032 Chronic diastolic (congestive) heart failure: Secondary | ICD-10-CM | POA: Diagnosis not present

## 2020-06-19 DIAGNOSIS — G20A1 Parkinson's disease without dyskinesia, without mention of fluctuations: Secondary | ICD-10-CM

## 2020-06-19 DIAGNOSIS — R079 Chest pain, unspecified: Secondary | ICD-10-CM

## 2020-06-19 DIAGNOSIS — R1032 Left lower quadrant pain: Secondary | ICD-10-CM

## 2020-06-19 DIAGNOSIS — G20 Parkinson's disease: Secondary | ICD-10-CM

## 2020-06-19 DIAGNOSIS — N184 Chronic kidney disease, stage 4 (severe): Secondary | ICD-10-CM

## 2020-06-19 DIAGNOSIS — I4891 Unspecified atrial fibrillation: Secondary | ICD-10-CM | POA: Diagnosis not present

## 2020-06-19 DIAGNOSIS — R059 Cough, unspecified: Secondary | ICD-10-CM | POA: Diagnosis not present

## 2020-06-19 DIAGNOSIS — R10A2 Flank pain, left side: Secondary | ICD-10-CM

## 2020-06-20 LAB — COMPREHENSIVE METABOLIC PANEL
AG Ratio: 1.5 (calc) (ref 1.0–2.5)
ALT: 26 U/L (ref 9–46)
AST: 33 U/L (ref 10–35)
Albumin: 3.7 g/dL (ref 3.6–5.1)
Alkaline phosphatase (APISO): 50 U/L (ref 35–144)
BUN/Creatinine Ratio: 15 (calc) (ref 6–22)
BUN: 26 mg/dL — ABNORMAL HIGH (ref 7–25)
CO2: 25 mmol/L (ref 20–32)
Calcium: 9.1 mg/dL (ref 8.6–10.3)
Chloride: 101 mmol/L (ref 98–110)
Creat: 1.72 mg/dL — ABNORMAL HIGH (ref 0.70–1.11)
Globulin: 2.5 g/dL (calc) (ref 1.9–3.7)
Glucose, Bld: 102 mg/dL — ABNORMAL HIGH (ref 65–99)
Potassium: 4.5 mmol/L (ref 3.5–5.3)
Sodium: 136 mmol/L (ref 135–146)
Total Bilirubin: 0.7 mg/dL (ref 0.2–1.2)
Total Protein: 6.2 g/dL (ref 6.1–8.1)

## 2020-06-20 LAB — CBC WITH DIFFERENTIAL/PLATELET
Absolute Monocytes: 475 cells/uL (ref 200–950)
Basophils Absolute: 40 cells/uL (ref 0–200)
Basophils Relative: 0.8 %
Eosinophils Absolute: 270 cells/uL (ref 15–500)
Eosinophils Relative: 5.4 %
HCT: 37.2 % — ABNORMAL LOW (ref 38.5–50.0)
Hemoglobin: 12.8 g/dL — ABNORMAL LOW (ref 13.2–17.1)
Lymphs Abs: 1945 cells/uL (ref 850–3900)
MCH: 31.1 pg (ref 27.0–33.0)
MCHC: 34.4 g/dL (ref 32.0–36.0)
MCV: 90.3 fL (ref 80.0–100.0)
MPV: 10.9 fL (ref 7.5–12.5)
Monocytes Relative: 9.5 %
Neutro Abs: 2270 cells/uL (ref 1500–7800)
Neutrophils Relative %: 45.4 %
Platelets: 230 10*3/uL (ref 140–400)
RBC: 4.12 10*6/uL — ABNORMAL LOW (ref 4.20–5.80)
RDW: 13.6 % (ref 11.0–15.0)
Total Lymphocyte: 38.9 %
WBC: 5 10*3/uL (ref 3.8–10.8)

## 2020-06-20 LAB — URINE CULTURE
MICRO NUMBER:: 11444472
Result:: NO GROWTH
SPECIMEN QUALITY:: ADEQUATE

## 2020-06-20 LAB — BRAIN NATRIURETIC PEPTIDE: Brain Natriuretic Peptide: 44 pg/mL (ref <100)

## 2020-06-22 ENCOUNTER — Ambulatory Visit (HOSPITAL_COMMUNITY): Payer: Medicare HMO

## 2020-06-23 ENCOUNTER — Ambulatory Visit (HOSPITAL_COMMUNITY): Payer: Medicare HMO

## 2020-06-29 NOTE — Progress Notes (Signed)
[place] Phone: [phone] Subjective:   Kevin [name], am serving as a scribe for Dr. Hulan Saas. This visit occurred during the SARS-CoV-2 public health emergency.  Safety protocols were in place, including screening questions prior to the visit, additional usage of staff PPE, and extensive cleaning of exam room while observing appropriate contact time as indicated for disinfecting solutions.  I'm seeing this patient by the request  of:  Marin Olp, MD  CC: Knee pain, back pain follow-up  [id number]   05/19/2020 Patient has been responding somewhat to epidurals and patient would like another one at this time.  Patient will be moving to [place] and will see if anybody else can do that therapy will get one before he leaves.  Ordered today.  Discontinue Pletal completely at this time patient otherwise will continue with the gabapentin.  We will see how patient responds.  Concerns of the back pain can be secondary to more of the kidney as well.  Patient knows red flags and when to seek medical attention.  Patient is following up with primary care in the near future and has been on Keflex at this time.  Known arthritic changes of the knees bilaterally.  Multiple different comorbidities making treatment difficult.  Chronic problem with exacerbation.  Patient has responded well to injections and last one was 5 months ago.  We will try again and hopefully patient will respond.  Discussed icing regimen and home exercises.  Increase activity slowly.  Follow-up again 6 to 12 weeks.  Update 06/30/2020 Kevin [name] is a 80 y.o. male coming in with complaint of low back pain and knee pain. Has had 2 epidurals since last visit. He states that the second one worked better than the first.  Patient states that his spine pain is almost completely gone.  Just having the side pain.  Patient is scheduled for CT later today for  this.  Patient states that it seems to come and go.  [name] pain in knees today. 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   Having CT Ab and Pelvis later today due to side pain.        Past Medical History:  Diagnosis Date  . Arthritis    DDD- Lower Back  . Bradycardia   . CAP (community acquired pneumonia) 03/17/2015   "THAT'S WHAT THEY ARE THINKING; THEY ARE NOT SURE"  . Carotid artery occlusion   . Chest pain 06/19/2018  . Chronic lower back pain    DDD  . CKD (chronic kidney disease)    Dr. Corliss Parish  . 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 Diverticulosis   . Enlarged prostate    takes Flomax daily  . History of blood transfusion    "probably when they did aortic aneurysm"  . History of colon polyps    benign  . Hyperlipidemia   . Hypertension    takes Amlodipine and Zebeta daily  . Insomnia    takes Melatonin nightly  . Joint pain   . 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     . Muscle spasm    takes Zanaflex daily as needed  .  Peripheral edema    takes Furosemide daily  . Pneumonia    hx of   . Rheumatic fever 1946  . Rheumatoid arthritis ([place])   . Short-term memory loss    minimal  . Shortness of breath dyspnea    with exertion but lasts very short period of time  . TIA (transient ischemic attack)    x 2;takes Plavix daily  . Urinary frequency    takes Flomax daily  . Urinary urgency    Past Surgical History:  Procedure Laterality Date  . ABDOMINAL AORTIC ANEURYSM REPAIR  2010  . CAROTID ENDARTERECTOMY Left 09/20/2004  . CATARACT EXTRACTION, BILATERAL     2016-2017  . COLONOSCOPY    . CORONARY ARTERY BYPASS GRAFT N/A 06/27/2018   Procedure: CORONARY ARTERY BYPASS GRAFTING (CABG), ON PUMP,  TIMES FOUR, USING LEFT INTERNAL MAMMARY ARTERY AND ENDOSCOPICALLY HARVESTED LEFT GREATER SAPHENOUS VEIN;  Surgeon: Melrose Nakayama, MD;  Location: [facility] [place] [facility];  Service: Open Heart Surgery;  Laterality: N/A;  . ENDARTERECTOMY Right 10/12/2015   Procedure: RIGHT CAROTID ENDARTERECTOMY WITH PATCH ANGIOPLASTY;  Surgeon: Elam Dutch, MD;  Location: [facility];  Service: Vascular;  Laterality: Right;  . INGUINAL HERNIA REPAIR Left 02/05/2013   Procedure: HERNIA REPAIR INGUINAL ADULT;  Surgeon: Joyice Faster. Cornett, MD;  Location: [facility];  Service: General;  Laterality: Left;  . INGUINAL HERNIA REPAIR Left   . INSERTION OF MESH Left 02/05/2013   Procedure: INSERTION OF MESH;  Surgeon: Joyice Faster. Cornett, MD;  Location: [facility];  Service: General;  Laterality: Left;  . LEFT HEART CATH AND CORONARY ANGIOGRAPHY N/A 06/21/2018   Procedure: LEFT HEART CATH AND CORONARY ANGIOGRAPHY;  Surgeon: Jettie Booze, MD;  Location: [facility];  Service: Cardiovascular;  Laterality: N/A;  . SHOULDER ARTHROSCOPY WITH ROTATOR CUFF REPAIR AND SUBACROMIAL DECOMPRESSION Left 05/01/2014   Procedure: LEFT ARTHROSCOPY SHOULDER SUBACROMIAL DECOMPRESSION,DISTAL CLAVICAL RESECTION AND ROTATOR CUFF REPAIR;  Surgeon: Marin Shutter, MD;  Location: [facility];  Service: Orthopedics;  Laterality: Left;  . TEE WITHOUT CARDIOVERSION N/A 06/27/2018   Procedure: TRANSESOPHAGEAL ECHOCARDIOGRAM (TEE);  Surgeon: Melrose Nakayama, MD;  Location: [facility];  Service: Open Heart Surgery;  Laterality: N/A;  . TESTICLAR CYST EXCISION Left   . TONSILLECTOMY    . VASECTOMY     Social History   Socioeconomic History  . Marital status: Married    Spouse name: Not on file  . Number of children: Not on file  . Years of education: Not on file  . Highest education level: Not on file  Occupational History  . Occupation: retired    Comment: maintenance; electrician  Tobacco Use  . Smoking status: Former Smoker     Packs/day: 2.00    Years: 28.00    Pack years: 56.00    Types: Cigarettes    Quit date: 05/31/1987    Years since quitting: 33.1  . Smokeless tobacco: Never Used  . Tobacco comment: quit smoking "in my 40's"  Vaping Use  . Vaping Use: Never used  Substance and Sexual Activity  . Alcohol use: Not Currently    Alcohol/week: 0.0 standard drinks    Comment: rare, mikes hard lemonade  . Drug use: Not Currently    Types: Marijuana    Comment: hx of-4-5 yrs ago  . Sexual activity: Not Currently  Other Topics Concern  . Not on file  Social History Narrative   Married 33 years in 2015. Lived together for  12 years. 2 boys from first wife. 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       Hobbies: yardwork, [place] barn, woodwork, collect knive   Right handed   One story home   Caffeine yes   Social Determinants of Health   Financial Resource Strain: Low Risk   . Difficulty of Paying Living Expenses: Not hard at all  Food Insecurity: [name] Food Insecurity  . Worried About Charity fundraiser in the Last Year: Never true  . Ran Out of Food in the Last Year: Never true  Transportation Needs: [name] Transportation Needs  . Lack of Transportation (Medical): [name]  . Lack of Transportation (Non-Medical): [name]  Physical Activity: Inactive  . Days of Exercise per Week: 0 days  . Minutes of Exercise per Session: 0 min  Stress: [name] Stress Concern Present  . Feeling of Stress : Not at all  Social Connections: Moderately Isolated  . Frequency of Communication with Friends and Family: More than three times a week  . Frequency of Social Gatherings with Friends and Family: Once a week  . Attends Religious Services: Never  . Active Member of Clubs or Organizations: [name]  . Attends Archivist Meetings: Never  . Marital Status: Married   Allergies  Allergen Reactions  . Hydralazine  Other (See Comments)    Pt stated he feels drunk  . Mirtazapine Other (See Comments)    Very tired, feeling drunk, loopy  . Sinemet [Carbidopa-Levodopa] Other (See Comments)    GI symptoms    Family History  Problem Relation Age of Onset  . Parkinsonism Father   . Dementia Father   . Cancer Father        Throat  . Colon cancer Mother        died in 39s  . Cancer Mother        Colon     Current Outpatient Medications (Cardiovascular):  .  amLODipine (NORVASC) 10 MG tablet, TAKE 1 TABLET EVERY DAY .  fenofibrate 160 MG tablet, Take 1 tablet (160 mg total) by mouth daily. .  furosemide (LASIX) 20 MG tablet, TAKE 1 TABLET EVERY DAY .  metoprolol tartrate (LOPRESSOR) 25 MG tablet, TAKE 1 TABLET TWICE DAILY .  rosuvastatin (CRESTOR) 40 MG tablet, Take 1 tablet (40 mg total) by mouth daily.  Current Outpatient Medications (Respiratory):  .  albuterol (VENTOLIN HFA) 108 (90 Base) MCG/ACT inhaler, Inhale 2 puffs into the lungs every 12 (twelve) hours as needed for wheezing or shortness of breath.  Current Outpatient Medications (Analgesics):  .  aspirin EC 81 MG tablet, Take 81 mg by mouth daily. Swallow whole. .  oxyCODONE-acetaminophen (PERCOCET) 7.5-325 MG tablet, Take 1 tablet by mouth every 8 (eight) hours as needed for severe pain.   Current Outpatient Medications (Other):  .  gabapentin (NEURONTIN) 400 MG capsule, Take 1 capsule (400 mg total) by mouth in the morning and at bedtime. Marland Kitchen  linaclotide (LINZESS) 145 MCG CAPS capsule, Take 145 mcg by mouth as needed (for stomach).  .  Melatonin 10 MG TABS, Take 10 mg by mouth at bedtime. .  tamsulosin (FLOMAX) 0.4 MG CAPS capsule, Take 1 capsule (0.4 mg total) by mouth daily. Marland Kitchen  tiZANidine (ZANAFLEX) 4 MG tablet, TAKE 1 TABLET EVERY 6 HOURS AS NEEDED FOR MUSCLE SPASM(S) .  valACYclovir (VALTREX) 1000  MG tablet, Take 1 tablet (1,000 mg total) by mouth 3 (three) times daily. For 7 days for shingles.   Reviewed prior external  information including notes and imaging from  primary care provider As well as notes that were available from care everywhere and other healthcare systems.  Past medical history, social, surgical and family history all reviewed in electronic medical record.  [name] pertanent information unless stated regarding to the chief complaint.   Review of Systems:  [name] headache, visual changes, nausea, vomiting, diarrhea, constipation, dizziness, abdominal pain, skin rash, fevers, chills, night sweats, weight loss, swollen lymph nodes, body aches, joint swelling, chest pain, shortness of breath, mood changes. POSITIVE muscle aches, body aches  Objective  Blood pressure 120/72, pulse 82, height 5\' 7"  (1.702 m), weight 159 lb (72.1 kg), SpO2 97 %.   General: [name] apparent distress alert and oriented x3 mood and affect normal, dressed appropriately.  Mild resting tremor noted. HEENT: Pupils equal, extraocular movements intact  Respiratory: Patient's speak in full sentences and does not appear short of breath  Cardiovascular: [name] lower extremity edema, non tender, [name] erythema  Gait mild shuffling gait. MSK: Hypertonicity noted.  Arthritic changes of multiple joints.  Patient was sitting comfortably today.  Does have some very mild discomfort of pain over the medial joint space of the knees.  Patient has decent range of motion today.  [name] significant swelling.  Does have atrophy of the lower extremities bilaterally.   Impression and Recommendations:     The above documentation has been reviewed and is accurate and complete Lyndal Pulley, DO

## 2020-06-30 ENCOUNTER — Other Ambulatory Visit: Payer: Self-pay

## 2020-06-30 ENCOUNTER — Ambulatory Visit: Payer: Medicare HMO | Admitting: Family Medicine

## 2020-06-30 ENCOUNTER — Ambulatory Visit (HOSPITAL_COMMUNITY)
Admission: RE | Admit: 2020-06-30 | Discharge: 2020-06-30 | Disposition: A | Discharge status: Home / Self Care | Payer: Medicare HMO | Source: Ambulatory Visit | Attending: Family Medicine | Admitting: Family Medicine

## 2020-06-30 ENCOUNTER — Encounter (HOSPITAL_COMMUNITY): Payer: Self-pay

## 2020-06-30 ENCOUNTER — Encounter: Payer: Self-pay | Admitting: Family Medicine

## 2020-06-30 DIAGNOSIS — R1032 Left lower quadrant pain: Secondary | ICD-10-CM | POA: Insufficient documentation

## 2020-06-30 DIAGNOSIS — M170 Bilateral primary osteoarthritis of knee: Secondary | ICD-10-CM | POA: Diagnosis not present

## 2020-06-30 DIAGNOSIS — M47816 Spondylosis without myelopathy or radiculopathy, lumbar region: Secondary | ICD-10-CM

## 2020-06-30 DIAGNOSIS — I700 Atherosclerosis of aorta: Secondary | ICD-10-CM | POA: Diagnosis not present

## 2020-06-30 DIAGNOSIS — K5730 Diverticulosis of large intestine without perforation or abscess without bleeding: Secondary | ICD-10-CM | POA: Diagnosis not present

## 2020-06-30 DIAGNOSIS — R109 Unspecified abdominal pain: Secondary | ICD-10-CM | POA: Insufficient documentation

## 2020-06-30 DIAGNOSIS — K8020 Calculus of gallbladder without cholecystitis without obstruction: Secondary | ICD-10-CM | POA: Diagnosis not present

## 2020-06-30 NOTE — Patient Instructions (Signed)
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

## 2020-06-30 NOTE — Assessment & Plan Note (Signed)
Patient is doing relatively well at this time with the knees.  I encouraged him to hold on any type of injection at this point.  We can consider doing it again no in 4 to 6 weeks.  Patient is likely moving to [place] after that we have given him the name of another provider that I think would do very well with him.

## 2020-06-30 NOTE — Assessment & Plan Note (Signed)
Patient has responded fairly well to the epidurals this time.  No significant other changes.  Patient has many other comorbidities that is contributing to more of his discomfort and pain than I think his back at this moment.  Follow-up with me again in 4 to 6 weeks

## 2020-07-06 ENCOUNTER — Other Ambulatory Visit: Payer: Self-pay | Admitting: Family Medicine

## 2020-07-07 NOTE — Progress Notes (Signed)
[place] [address] [place] Phone: [phone] Subjective:   I Kandace Blitz am serving as a Education administrator for Dr. Hulan Saas.  This visit occurred during the SARS-CoV-2 public health emergency.  Safety protocols were in place, including screening questions prior to the visit, additional usage of staff PPE, and extensive cleaning of exam room while observing appropriate contact time as indicated for disinfecting solutions.   I'm seeing this patient by the request  of:  Marin Olp, MD  CC: Multiple complaints.  [id number]   06/30/2020 Patient has responded fairly well to the epidurals this time.  No significant other changes.  Patient has many other comorbidities that is contributing to more of his discomfort and pain than I think his back at this moment.  Follow-up with me again in 4 to 6 weeks  Patient is doing relatively well at this time with the knees.  I encouraged him to hold on any type of injection at this point.  We can consider doing it again no in 4 to 6 weeks.  Patient is likely moving to [place] after that we have given him the name of another provider that I think would do very well with him.  Update 07/08/2020 SAUNDERS ARLINGTON is a 80 y.o. male coming in with complaint of left knee, right hip and back pain. Patient states his back is painful but not as bad as it was before. Wants another epidural before [place] leaves [place]. Right hip is 12/10 on the pain scale. Knee is still painful.  Patient last exam was doing well and wanted to hold on the injection but now is having worsening pain again.  States that it is affecting daily activities.  Having right-sided hip pain as well.  Has had known greater trochanteric bursitis previously.  All these pains is stopping him from being active which he would like to do.    Past Medical History:  Diagnosis Date  . Arthritis    DDD- Lower Back  . Bradycardia   . CAP (community acquired  pneumonia) 03/17/2015   "THAT'S WHAT THEY ARE THINKING; THEY ARE NOT SURE"  . Carotid artery occlusion   . Chest pain 06/19/2018  . Chronic lower back pain    DDD  . CKD (chronic kidney disease)    Dr. Corliss Parish  . CKD (chronic kidney disease), stage III ([place]) 01/27/2014   GFR just above 30--> just below 30 11/10/14 refer to nephrology Creatinine 2 baseline but went up to 2.5 peak Likely RAS- lisinopril not great choice   . Diverticulosis   . Enlarged prostate    takes Flomax daily  . History of blood transfusion    "probably when they did aortic aneurysm"  . History of colon polyps    benign  . Hyperlipidemia   . Hypertension    takes Amlodipine and Zebeta daily  . Insomnia    takes Melatonin nightly  . Joint pain   . 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     . Muscle spasm    takes Zanaflex daily as needed  . Peripheral edema    takes Furosemide daily  . Pneumonia    hx of   . Rheumatic fever 1946  . Rheumatoid arthritis ([place])   . Short-term memory loss    minimal  . Shortness of breath dyspnea    with exertion but lasts very short period of time  . TIA (transient ischemic  attack)    x 2;takes Plavix daily  . Urinary frequency    takes Flomax daily  . Urinary urgency    Past Surgical History:  Procedure Laterality Date  . ABDOMINAL AORTIC ANEURYSM REPAIR  2010  . CAROTID ENDARTERECTOMY Left 09/20/2004  . CATARACT EXTRACTION, BILATERAL     2016-2017  . COLONOSCOPY    . 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 ENDARTERECTOMY Right 10/12/2015   Procedure: RIGHT CAROTID ENDARTERECTOMY WITH PATCH ANGIOPLASTY;  Surgeon: Elam Dutch, MD;  Location: [facility];  Service:  Vascular;  Laterality: Right;  . INGUINAL HERNIA REPAIR Left 02/05/2013   Procedure: HERNIA REPAIR INGUINAL ADULT;  Surgeon: Joyice Faster. Cornett, MD;  Location: [facility];  Service: General;  Laterality: Left;  . INGUINAL HERNIA REPAIR Left   . INSERTION OF MESH Left 02/05/2013   Procedure: INSERTION OF MESH;  Surgeon: Joyice Faster. Cornett, MD;  Location: [facility];  Service: General;  Laterality: Left;  . LEFT HEART CATH AND CORONARY ANGIOGRAPHY N/A 06/21/2018   Procedure: LEFT HEART CATH AND CORONARY ANGIOGRAPHY;  Surgeon: Jettie Booze, MD;  Location: [facility];  Service: Cardiovascular;  Laterality: N/A;  . SHOULDER ARTHROSCOPY WITH ROTATOR CUFF REPAIR AND SUBACROMIAL DECOMPRESSION Left 05/01/2014   Procedure: LEFT ARTHROSCOPY SHOULDER SUBACROMIAL DECOMPRESSION,DISTAL CLAVICAL RESECTION AND ROTATOR CUFF REPAIR;  Surgeon: Marin Shutter, MD;  Location: [facility];  Service: Orthopedics;  Laterality: Left;  . TEE WITHOUT CARDIOVERSION N/A 06/27/2018   Procedure: TRANSESOPHAGEAL ECHOCARDIOGRAM (TEE);  Surgeon: Melrose Nakayama, MD;  Location: [facility];  Service: Open Heart Surgery;  Laterality: N/A;  . TESTICLAR CYST EXCISION Left   . TONSILLECTOMY    . VASECTOMY     Social History   Socioeconomic History  . Marital status: Married    Spouse name: Not on file  . Number of children: Not on file  . Years of education: Not on file  . Highest education level: Not on file  Occupational History  . Occupation: retired    Comment: maintenance; electrician  Tobacco Use  . Smoking status: Former Smoker    Packs/day: 2.00    Years: 28.00    Pack years: 56.00    Types: Cigarettes    Quit date: 05/31/1987    Years since quitting: 33.1  . Smokeless tobacco: Never Used  . Tobacco comment: quit smoking "in my 40's"  Vaping Use  . Vaping Use: Never used  Substance and Sexual Activity  . Alcohol use: Not Currently    Alcohol/week: 0.0 standard drinks     Comment: rare, mikes hard lemonade  . Drug use: Not Currently    Types: Marijuana    Comment: hx of-4-5 yrs ago  . Sexual activity: Not Currently  Other Topics Concern  . Not on file  Social History Narrative   Married 33 years in 2015. Lived together for 12 years. 2 boys from first wife. 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       Hobbies: yardwork, [place] barn, woodwork, collect knive   Right handed   One  story home   Caffeine yes   Social Determinants of Health   Financial Resource Strain: Low Risk   . Difficulty of Paying Living Expenses: Not hard at all  Food Insecurity: No Food Insecurity  . Worried About Charity fundraiser in the Last Year: Never true  . Ran Out of Food in the Last Year: Never true  Transportation Needs: No Transportation Needs  . Lack of Transportation (Medical): No  . Lack of Transportation (Non-Medical): No  Physical Activity: Inactive  . Days of Exercise per Week: 0 days  . Minutes of Exercise per Session: 0 min  Stress: No Stress Concern Present  . Feeling of Stress : Not at all  Social Connections: Moderately Isolated  . Frequency of Communication with Friends and Family: More than three times a week  . Frequency of Social Gatherings with Friends and Family: Once a week  . Attends Religious Services: Never  . Active Member of Clubs or Organizations: No  . Attends Archivist Meetings: Never  . Marital Status: Married   Allergies  Allergen Reactions  . Hydralazine Other (See Comments)    Pt stated he feels drunk  . Mirtazapine Other (See Comments)    Very tired, feeling drunk, loopy  . Sinemet [Carbidopa-Levodopa] Other (See Comments)    GI symptoms    Family History  Problem Relation Age of Onset  . Parkinsonism Father   . Dementia Father   . Cancer Father        Throat  . Colon cancer Mother        died  in 49s  . Cancer Mother        Colon     Current Outpatient Medications (Cardiovascular):  .  amLODipine (NORVASC) 10 MG tablet, TAKE 1 TABLET EVERY DAY .  fenofibrate 160 MG tablet, Take 1 tablet (160 mg total) by mouth daily. .  furosemide (LASIX) 20 MG tablet, TAKE 1 TABLET EVERY DAY .  metoprolol tartrate (LOPRESSOR) 25 MG tablet, TAKE 1 TABLET TWICE DAILY .  rosuvastatin (CRESTOR) 40 MG tablet, Take 1 tablet (40 mg total) by mouth daily.  Current Outpatient Medications (Respiratory):  .  albuterol (VENTOLIN HFA) 108 (90 Base) MCG/ACT inhaler, Inhale 2 puffs into the lungs every 12 (twelve) hours as needed for wheezing or shortness of breath.  Current Outpatient Medications (Analgesics):  .  aspirin EC 81 MG tablet, Take 81 mg by mouth daily. Swallow whole. .  oxyCODONE-acetaminophen (PERCOCET) 7.5-325 MG tablet, Take 1 tablet by mouth every 8 (eight) hours as needed for severe pain.   Current Outpatient Medications (Other):  .  gabapentin (NEURONTIN) 400 MG capsule, Take 1 capsule (400 mg total) by mouth in the morning and at bedtime. Marland Kitchen  linaclotide (LINZESS) 145 MCG CAPS capsule, Take 145 mcg by mouth as needed (for stomach).  .  Melatonin 10 MG TABS, Take 10 mg by mouth at bedtime. .  tamsulosin (FLOMAX) 0.4 MG CAPS capsule, Take 1 capsule (0.4 mg total) by mouth daily. Marland Kitchen  tiZANidine (ZANAFLEX) 4 MG tablet, TAKE 1 TABLET EVERY 6 HOURS AS NEEDED FOR MUSCLE SPASM(S) .  valACYclovir (VALTREX) 1000 MG tablet, Take 1 tablet (1,000 mg total) by mouth 3 (three) times daily. For 7 days for shingles.   Reviewed prior external information including notes and imaging from  primary care provider As well as notes that were available from care everywhere and other healthcare systems.  Past medical history, social, surgical and family history all reviewed in  electronic medical record.  No pertanent information unless stated regarding to the chief complaint.   Review of Systems:  No  headache, visual changes, nausea, vomiting, diarrhea, constipation, dizziness, abdominal pain, skin rash, fevers, chills, night sweats, weight loss, swollen lymph nodes, joint swelling, chest pain, shortness of breath, mood changes. POSITIVE muscle aches, body aches  Objective  There were no vitals taken for this visit.   General: No apparent distress alert and oriented x3 mood and affect normal, dressed appropriately.  Mild masked facies noted.  Mild tremor noted of the upper extremity HEENT: Pupils equal, extraocular movements intact  Respiratory: Patient's speak in full sentences and does not appear short of breath  Cardiovascular: No lower extremity edema, non tender, no erythema  Gait antalgic with shuffling gait MSK: Patient does have some mild hypertonicity Left knee does have tenderness to palpation over the lateral and medial joint space.  No significant swelling.  Hypertonicity noted with sleep rigidity noted. Right hip severely tender over the greater trochanteric area.  Back is diffusely tender but mostly around the sacroiliac joint in the L5 bilaterally.  After informed written and verbal consent, patient was seated on exam table. Left knee was prepped with alcohol swab and utilizing anterolateral approach, patient's left knee space was injected with 4:1  marcaine 0.5%: Kenalog 40mg /dL. Patient tolerated the procedure well without immediate complications.  After verbal consent patient was prepped in sterile fashion with alcohol swabs. Ethyl chloride used patient was injected with a 22-gauge 3 inch needle into the RIGHT lateral hip in the greater trochanteric area under ultrasound guidance. Picture was taken. Patient had 2 cc of 0.5% Marcaine and 1 cc of Kenalog 40 mg/dL injected. Patient tolerated the procedure well and no blood loss. Pain completely resolved after injection stating proper placement. Post injection instructions given.    Impression and Recommendations:     The  above documentation has been reviewed and is accurate and complete Lyndal Pulley, DO

## 2020-07-08 ENCOUNTER — Ambulatory Visit: Payer: Medicare HMO | Admitting: Family Medicine

## 2020-07-08 ENCOUNTER — Encounter: Payer: Self-pay | Admitting: Family Medicine

## 2020-07-08 ENCOUNTER — Other Ambulatory Visit: Payer: Self-pay

## 2020-07-08 VITALS — BP 100/70 | HR 77 | Ht 67.0 in | Wt 159.0 lb

## 2020-07-08 DIAGNOSIS — M545 Low back pain, unspecified: Secondary | ICD-10-CM

## 2020-07-08 DIAGNOSIS — G894 Chronic pain syndrome: Secondary | ICD-10-CM

## 2020-07-08 DIAGNOSIS — M7061 Trochanteric bursitis, right hip: Secondary | ICD-10-CM | POA: Diagnosis not present

## 2020-07-08 DIAGNOSIS — M1712 Unilateral primary osteoarthritis, left knee: Secondary | ICD-10-CM | POA: Diagnosis not present

## 2020-07-08 DIAGNOSIS — M170 Bilateral primary osteoarthritis of knee: Secondary | ICD-10-CM

## 2020-07-08 DIAGNOSIS — G8929 Other chronic pain: Secondary | ICD-10-CM

## 2020-07-08 DIAGNOSIS — M47816 Spondylosis without myelopathy or radiculopathy, lumbar region: Secondary | ICD-10-CM

## 2020-07-08 DIAGNOSIS — M7062 Trochanteric bursitis, left hip: Secondary | ICD-10-CM

## 2020-07-08 DIAGNOSIS — G20A1 Parkinson's disease without dyskinesia, without mention of fluctuations: Secondary | ICD-10-CM

## 2020-07-08 DIAGNOSIS — G20 Parkinson's disease: Secondary | ICD-10-CM | POA: Diagnosis not present

## 2020-07-08 NOTE — Assessment & Plan Note (Signed)
Patient does have degenerative joint disease bilaterally left greater than right.  Given another injection in the left side.  Patient has responded in the past and hopefully will again.  Patient wants to avoid any surgical intervention with his other comorbidities.  Patient encouraged to wear the brace on a more regular basis.  Follow-up with me again in 10 weeks.  Patient does see pain management and encouraged him to continue to see them and make adjustments accordingly.

## 2020-07-08 NOTE — Patient Instructions (Addendum)
Good to see you Injections today Epidural ordered If back is better after the injections hold off on epidural See me again in 10 weeks

## 2020-07-08 NOTE — Assessment & Plan Note (Signed)
Patient feels that the oxycodone is not working.  Patient is to follow-up with pain management

## 2020-07-08 NOTE — Assessment & Plan Note (Signed)
Likely contributing to some of her aches and pains as well secondary to the hypertonicity.

## 2020-07-08 NOTE — Assessment & Plan Note (Signed)
Patient given injection and tolerated the procedure well, discussed icing regimen and home exercises, increase activity slowly.  Discussed icing regimen.  Could be secondary to more of the radicular symptoms in the back again and we did discuss potentially repeating the epidural.  This was ordered as well.  Follow-up with me again 6 to 8 weeks

## 2020-07-08 NOTE — Assessment & Plan Note (Signed)
Patient has significant arthritic changes but has responded to the epidurals previously.  Patient's pain is more localized with less radicular symptoms.  Patient will have another epidural if needed okay.

## 2020-07-16 ENCOUNTER — Encounter: Payer: Medicare HMO | Attending: Physical Medicine & Rehabilitation | Admitting: Registered Nurse

## 2020-07-16 ENCOUNTER — Encounter: Payer: Self-pay | Admitting: Registered Nurse

## 2020-07-16 ENCOUNTER — Other Ambulatory Visit: Payer: Self-pay

## 2020-07-16 VITALS — BP 143/69 | HR 61 | Temp 98.4°F | Ht 67.0 in | Wt 160.0 lb

## 2020-07-16 DIAGNOSIS — Z5181 Encounter for therapeutic drug level monitoring: Secondary | ICD-10-CM

## 2020-07-16 DIAGNOSIS — M47816 Spondylosis without myelopathy or radiculopathy, lumbar region: Secondary | ICD-10-CM | POA: Diagnosis not present

## 2020-07-16 DIAGNOSIS — G894 Chronic pain syndrome: Secondary | ICD-10-CM

## 2020-07-16 DIAGNOSIS — Z79891 Long term (current) use of opiate analgesic: Secondary | ICD-10-CM | POA: Diagnosis not present

## 2020-07-16 DIAGNOSIS — R54 Age-related physical debility: Secondary | ICD-10-CM

## 2020-07-16 DIAGNOSIS — M1712 Unilateral primary osteoarthritis, left knee: Secondary | ICD-10-CM | POA: Diagnosis not present

## 2020-07-16 MED ORDER — OXYCODONE-ACETAMINOPHEN 7.5-325 MG PO TABS: 1.0000 | ORAL_TABLET | Freq: Three times a day (TID) | ORAL | 0 refills | Status: DC | PRN

## 2020-07-16 NOTE — Progress Notes (Signed)
Subjective:    Patient ID: Kevin [name], male    DOB: December 01, 1940, 80 y.o.   MRN: [id number]  HPI: Kevin [name] is a 80 y.o. male who returns for follow up appointment for chronic pain and medication refill. He states his pain is located in his lower back, also reports sometimes pain is not controlled with current ,medication regime.Spoke with Mr. and Kevin [name] that he would be allowed to take one tylenol per day  as needed for pain, for break through pain.They verbalize understanding. He rates his pain 5. His current exercise regime is walking.  Ms. Ciszewski Morphine equivalent is 24.44  MME.Mr. Bossi forgot his medication today, educated on the narcotic policy he verbalizes understanding.     Last Oral Swab was Performed on 04/09/2020, it was consistent.    Pain Inventory Average Pain 7 Pain Right Now 5 My pain is sharp, burning, dull and aching  In the last 24 hours, has pain interfered with the following? General activity 2 Relation with others 1 Enjoyment of life 1 What TIME of day is your pain at its worst? evening Sleep (in general) Fair  Pain is worse with: walking, bending and sitting Pain improves with: medication Relief from Meds: 4  Family History  Problem Relation Age of Onset  . Parkinsonism Father   . Dementia Father   . Cancer Father        Throat  . Colon cancer Mother        died in 20s  . Cancer Mother        Colon   Social History   Socioeconomic History  . Marital status: Married    Spouse name: Not on file  . Number of children: Not on file  . Years of education: Not on file  . Highest education level: Not on file  Occupational History  . Occupation: retired    Comment: maintenance; electrician  Tobacco Use  . Smoking status: Former Smoker    Packs/day: 2.00    Years: 28.00    Pack years: 56.00    Types: Cigarettes    Quit date: 05/31/1987    Years since quitting: 33.1  . Smokeless tobacco: Never Used  . Tobacco comment: quit smoking "in my  40's"  Vaping Use  . Vaping Use: Never used  Substance and Sexual Activity  . Alcohol use: Not Currently    Alcohol/week: 0.0 standard drinks    Comment: rare, mikes hard lemonade  . Drug use: Not Currently    Types: Marijuana    Comment: hx of-4-5 yrs ago  . Sexual activity: Not Currently  Other Topics Concern  . Not on file  Social History Narrative   Married 33 years in 2015. Lived together for 12 years. 2 boys from first wife. 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       Hobbies: yardwork, [place] barn, woodwork, collect knive   Right handed   One story home   Caffeine yes   Social Determinants of Health   Financial Resource Strain: Low Risk   . Difficulty of Paying Living Expenses: Not hard at all  Food Insecurity: No Food Insecurity  . Worried About Charity fundraiser in the Last Year: Never true  . Ran Out of Food in the Last Year: Never true  Transportation Needs: No Transportation  Needs  . Lack of Transportation (Medical): No  . Lack of Transportation (Non-Medical): No  Physical Activity: Inactive  . Days of Exercise per Week: 0 days  . Minutes of Exercise per Session: 0 min  Stress: No Stress Concern Present  . Feeling of Stress : Not at all  Social Connections: Moderately Isolated  . Frequency of Communication with Friends and Family: More than three times a week  . Frequency of Social Gatherings with Friends and Family: Once a week  . Attends Religious Services: Never  . Active Member of Clubs or Organizations: No  . Attends Archivist Meetings: Never  . Marital Status: Married   Past Surgical History:  Procedure Laterality Date  . ABDOMINAL AORTIC ANEURYSM REPAIR  2010  . CAROTID ENDARTERECTOMY Left 09/20/2004  . CATARACT EXTRACTION, BILATERAL     2016-2017  . COLONOSCOPY    . 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 ENDARTERECTOMY Right 10/12/2015   Procedure: RIGHT CAROTID ENDARTERECTOMY WITH PATCH ANGIOPLASTY;  Surgeon: Elam Dutch, MD;  Location: [facility];  Service: Vascular;  Laterality: Right;  . INGUINAL HERNIA REPAIR Left 02/05/2013   Procedure: HERNIA REPAIR INGUINAL ADULT;  Surgeon: Joyice Faster. Cornett, MD;  Location: [facility];  Service: General;  Laterality: Left;  . INGUINAL HERNIA REPAIR Left   . INSERTION OF MESH Left 02/05/2013   Procedure: INSERTION OF MESH;  Surgeon: Joyice Faster. Cornett, MD;  Location: [facility];  Service: General;  Laterality: Left;  . LEFT HEART CATH AND CORONARY ANGIOGRAPHY N/A 06/21/2018   Procedure: LEFT HEART CATH AND CORONARY ANGIOGRAPHY;  Surgeon: Jettie Booze, MD;  Location: [facility];  Service: Cardiovascular;  Laterality: N/A;  . SHOULDER ARTHROSCOPY WITH ROTATOR CUFF REPAIR AND SUBACROMIAL DECOMPRESSION Left 05/01/2014   Procedure: LEFT ARTHROSCOPY SHOULDER SUBACROMIAL DECOMPRESSION,DISTAL CLAVICAL RESECTION AND ROTATOR CUFF REPAIR;  Surgeon: Marin Shutter, MD;  Location: [facility];  Service: Orthopedics;  Laterality: Left;  . TEE WITHOUT CARDIOVERSION N/A 06/27/2018   Procedure: TRANSESOPHAGEAL ECHOCARDIOGRAM (TEE);  Surgeon: Melrose Nakayama, MD;  Location: [facility];  Service: Open Heart Surgery;  Laterality: N/A;  . TESTICLAR CYST EXCISION Left   . TONSILLECTOMY    . VASECTOMY     Past Surgical History:  Procedure Laterality Date  . ABDOMINAL AORTIC ANEURYSM REPAIR  2010  . CAROTID ENDARTERECTOMY Left 09/20/2004  . CATARACT EXTRACTION, BILATERAL     2016-2017  . COLONOSCOPY    . 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 ENDARTERECTOMY Right 10/12/2015   Procedure: RIGHT CAROTID ENDARTERECTOMY WITH PATCH ANGIOPLASTY;  Surgeon: Elam Dutch, MD;  Location: [facility];  Service: Vascular;  Laterality: Right;  . INGUINAL HERNIA REPAIR Left 02/05/2013   Procedure: HERNIA REPAIR INGUINAL ADULT;  Surgeon: Joyice Faster. Cornett, MD;  Location: [facility];  Service: General;  Laterality: Left;  . INGUINAL HERNIA REPAIR Left   . INSERTION OF MESH Left 02/05/2013   Procedure: INSERTION OF  MESH;  Surgeon: Joyice Faster. Cornett, MD;  Location: [facility];  Service: General;  Laterality: Left;  . LEFT HEART CATH AND CORONARY ANGIOGRAPHY N/A 06/21/2018   Procedure: LEFT HEART CATH AND CORONARY ANGIOGRAPHY;  Surgeon: Jettie Booze, MD;  Location: [facility];  Service: Cardiovascular;  Laterality: N/A;  . SHOULDER ARTHROSCOPY WITH ROTATOR CUFF REPAIR AND SUBACROMIAL DECOMPRESSION Left 05/01/2014   Procedure: LEFT ARTHROSCOPY SHOULDER SUBACROMIAL DECOMPRESSION,DISTAL CLAVICAL RESECTION AND ROTATOR CUFF REPAIR;  Surgeon: Marin Shutter, MD;  Location: [facility];  Service: Orthopedics;  Laterality: Left;  . TEE WITHOUT CARDIOVERSION N/A 06/27/2018   Procedure: TRANSESOPHAGEAL ECHOCARDIOGRAM (TEE);  Surgeon: Melrose Nakayama, MD;  Location: [facility];  Service: Open Heart Surgery;  Laterality: N/A;  . TESTICLAR CYST EXCISION Left   . TONSILLECTOMY    . VASECTOMY     Past Medical History:  Diagnosis Date  . Arthritis    DDD- Lower Back  . Bradycardia   . CAP (community acquired pneumonia) 03/17/2015   "THAT'S WHAT THEY ARE THINKING; THEY ARE NOT SURE"  . Carotid artery occlusion   . Chest pain 06/19/2018  . Chronic lower back pain    DDD  . CKD (chronic kidney  disease)    Dr. Corliss Parish  . 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 Diverticulosis   . Enlarged prostate    takes Flomax daily  . History of blood transfusion    "probably when they did aortic aneurysm"  . History of colon polyps    benign  . Hyperlipidemia   . Hypertension    takes Amlodipine and Zebeta daily  . Insomnia    takes Melatonin nightly  . Joint pain   . 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     . Muscle spasm    takes Zanaflex daily as needed  . Peripheral edema    takes Furosemide daily  . Pneumonia    hx of   . Rheumatic fever 1946  . Rheumatoid arthritis ([place])   . Short-term memory loss    minimal  . Shortness of breath dyspnea    with exertion but lasts very short period of time  . TIA (transient ischemic attack)    x 2;takes Plavix daily  . Urinary frequency    takes Flomax daily  . Urinary urgency    BP (!) 143/69   Pulse 61   Temp 98.4 F (36.9 C)   Ht 5\' 7"  (1.702 m)   Wt 160 lb (72.6 kg)   SpO2 96%   BMI 25.06 kg/m   Opioid Risk Score:   Fall Risk Score:  `1  Depression screen PHQ 2/9  Depression screen [facility] Hights 2/9 06/11/2020 06/05/2020 05/11/2020 04/09/2020 03/11/2020 01/09/2020 12/13/2019  Decreased Interest 0 0 0 1 0 0 0  Down, Depressed, Hopeless 0 0 0 1 0 0 0  PHQ - 2 Score 0 0 0 2 0 0 0  Altered sleeping - - - - - - -  Tired, decreased energy - - - - - - -  Change in appetite - - - - - - -  Feeling bad or failure about yourself  - - - - - - -  Trouble concentrating - - - - - - -  Moving slowly or  fidgety/restless - - - - - - -  PHQ-9 Score - - - - - - -  Some recent data might be hidden    Review of Systems  Constitutional: Negative.   HENT: Negative.   Eyes: Negative.   Respiratory: Negative.   Cardiovascular: Negative.    Gastrointestinal: Negative.   Endocrine: Negative.   Genitourinary: Negative.   Musculoskeletal: Positive for back pain and gait problem.  Skin: Negative.   Allergic/Immunologic: Negative.   Neurological: Positive for tremors.  Hematological: Negative.   Psychiatric/Behavioral: Negative.   All other systems reviewed and are negative.      Objective:   Physical Exam Vitals and nursing note reviewed.  Constitutional:      Appearance: Normal appearance.  Cardiovascular:     Rate and Rhythm: Normal rate and regular rhythm.     Pulses: Normal pulses.     Heart sounds: Normal heart sounds.  Pulmonary:     Effort: Pulmonary effort is normal.     Breath sounds: Normal breath sounds.  Musculoskeletal:     Cervical back: Normal range of motion and neck supple.     Comments: Normal Muscle Bulk and Muscle Testing Reveals:  Upper Extremities: Full ROM and Muscle Strength 5/5 Lumbar Paraspinal Tenderness: L-4-L-5 Lower Extremities: Full ROM and Muscle Strength 5/5 Arises from Table Slowly using cane for support  Antalgic Gait   Skin:    General: Skin is warm and dry.  Neurological:     Mental Status: He is alert and oriented to person, place, and time.  Psychiatric:        Mood and Affect: Mood normal.        Behavior: Behavior normal.           Assessment & Plan:  1. Lumbar Spondylosis/ Lumbar Stenosis:07/16/2020 Continue current medication regime. Refilled:Oxycodone 7.5/325 mg one tabletevery 8 hoursas needed #80.07/16/2020 We will continue the opioid monitoring program, this consists of regular clinic visits, examinations, urine drug screen, pill counts as well as use of [place] Controlled Substance Reporting system. A 12 month History has been reviewed on the [place] Controlled Substance Reporting Systemon02/17/2022. 2.Bilateral Knees OA/ChronicBilateralKnee Pain:No complaints today.S/PBilateral KneeInjection by DrSmithon 12/09/2019,  Orthopedist Following02/17/2022. 3.BilateralGreater Trochanter Bursitis:No complaints today.Orthopedist Following.Continue to alternate Ice and Heat Therapy.07/16/2020 4. Chronic Left Leg Pain: No complaints today. Continue Current Medication Regimen. Continue to Monitor. 07/16/2020   F/U in 1 month

## 2020-07-20 ENCOUNTER — Other Ambulatory Visit: Payer: Medicare HMO

## 2020-07-27 ENCOUNTER — Other Ambulatory Visit: Payer: Self-pay

## 2020-07-27 ENCOUNTER — Ambulatory Visit
Admission: RE | Admit: 2020-07-27 | Discharge: 2020-07-27 | Disposition: A | Discharge status: Home / Self Care | Payer: Medicare HMO | Source: Ambulatory Visit | Attending: Family Medicine | Admitting: Family Medicine

## 2020-07-27 DIAGNOSIS — M5416 Radiculopathy, lumbar region: Secondary | ICD-10-CM | POA: Diagnosis not present

## 2020-07-27 DIAGNOSIS — M545 Low back pain, unspecified: Secondary | ICD-10-CM

## 2020-07-27 MED ORDER — IOPAMIDOL (ISOVUE-M 200) INJECTION 41%
1.0000 mL | Freq: Once | INTRAMUSCULAR | Status: AC
  Administered 2020-07-27: 1 mL via EPIDURAL

## 2020-07-27 MED ORDER — METHYLPREDNISOLONE ACETATE 40 MG/ML INJ SUSP (RADIOLOG
120.0000 mg | Freq: Once | INTRAMUSCULAR | Status: AC
  Administered 2020-07-27: 120 mg via EPIDURAL

## 2020-07-27 NOTE — Discharge Instructions (Signed)

## 2020-08-04 NOTE — Progress Notes (Deleted)
[facility] Phone: [phone] Subjective:    I'm seeing this patient by the request  of:  Marin Olp, MD  CC:   PFX:[id number]   07/08/2020 Patient given injection and tolerated the procedure well, discussed icing regimen and home exercises, increase activity slowly.  Discussed icing regimen.  Could be secondary to more of the radicular symptoms in the back again and we did discuss potentially repeating the epidural.  This was ordered as well.  Follow-up with me again 6 to 8 weeks  Likely contributing to some of her aches and pains as well secondary to the hypertonicity.  Patient has significant arthritic changes but has responded to the epidurals previously.  Patient's pain is more localized with less radicular symptoms.  Patient will have another epidural if needed okay.  Patient does have degenerative joint disease bilaterally left greater than right.  Given another injection in the left side.  Patient has responded in the past and hopefully will again.  Patient wants to avoid any surgical intervention with his other comorbidities.  Patient encouraged to wear the brace on a more regular basis.  Follow-up with me again in 10 weeks.  Patient does see pain management and encouraged him to continue to see them and make adjustments accordingly.  Update 08/05/2020 Kevin [name] is a 80 y.o. male coming in with complaint of back, knee and hip pain. Patient states   Onset-  Location Duration-  Character- Aggravating factors- Reliving factors-  Therapies tried-  Severity-     Past Medical History:  Diagnosis Date  . Arthritis    DDD- Lower Back  . Bradycardia   . CAP (community acquired pneumonia) 03/17/2015   "THAT'S WHAT THEY ARE THINKING; THEY ARE NOT SURE"  . Carotid artery occlusion   . Chest pain 06/19/2018  . Chronic lower back pain    DDD  . CKD (chronic kidney disease)    Dr. Corliss Parish   . 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 Diverticulosis   . Enlarged prostate    takes Flomax daily  . History of blood transfusion    "probably when they did aortic aneurysm"  . History of colon polyps    benign  . Hyperlipidemia   . Hypertension    takes Amlodipine and Zebeta daily  . Insomnia    takes Melatonin nightly  . Joint pain   . 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     . Muscle spasm    takes Zanaflex daily as needed  . Peripheral edema    takes Furosemide daily  . Pneumonia    hx of   . Rheumatic fever 1946  . Rheumatoid arthritis ([place])   . Short-term memory loss    minimal  . Shortness of breath dyspnea    with exertion but lasts very short period of time  . TIA (transient ischemic attack)    x 2;takes Plavix daily  . Urinary frequency    takes Flomax daily  . Urinary urgency    Past Surgical History:  Procedure Laterality Date  . ABDOMINAL AORTIC ANEURYSM REPAIR  2010  . CAROTID ENDARTERECTOMY Left 09/20/2004  . CATARACT EXTRACTION, BILATERAL     2016-2017  . COLONOSCOPY    .  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 ENDARTERECTOMY Right 10/12/2015   Procedure: RIGHT CAROTID ENDARTERECTOMY WITH PATCH ANGIOPLASTY;  Surgeon: Elam Dutch, MD;  Location: [facility];  Service: Vascular;  Laterality: Right;  . INGUINAL HERNIA REPAIR Left 02/05/2013   Procedure: HERNIA REPAIR INGUINAL ADULT;  Surgeon: Joyice Faster. Cornett, MD;  Location: [facility];  Service: General;  Laterality: Left;  .  INGUINAL HERNIA REPAIR Left   . INSERTION OF MESH Left 02/05/2013   Procedure: INSERTION OF MESH;  Surgeon: Joyice Faster. Cornett, MD;  Location: [facility];  Service: General;  Laterality: Left;  . LEFT HEART CATH AND CORONARY ANGIOGRAPHY N/A 06/21/2018   Procedure: LEFT HEART CATH AND CORONARY ANGIOGRAPHY;  Surgeon: Jettie Booze, MD;  Location: [facility];  Service: Cardiovascular;  Laterality: N/A;  . SHOULDER ARTHROSCOPY WITH ROTATOR CUFF REPAIR AND SUBACROMIAL DECOMPRESSION Left 05/01/2014   Procedure: LEFT ARTHROSCOPY SHOULDER SUBACROMIAL DECOMPRESSION,DISTAL CLAVICAL RESECTION AND ROTATOR CUFF REPAIR;  Surgeon: Marin Shutter, MD;  Location: [facility];  Service: Orthopedics;  Laterality: Left;  . TEE WITHOUT CARDIOVERSION N/A 06/27/2018   Procedure: TRANSESOPHAGEAL ECHOCARDIOGRAM (TEE);  Surgeon: Melrose Nakayama, MD;  Location: [facility];  Service: Open Heart Surgery;  Laterality: N/A;  . TESTICLAR CYST EXCISION Left   . TONSILLECTOMY    . VASECTOMY     Social History   Socioeconomic History  . Marital status: Married    Spouse name: Not on file  . Number of children: Not on file  . Years of education: Not on file  . Highest education level: Not on file  Occupational History  . Occupation: retired    Comment: maintenance; electrician  Tobacco Use  . Smoking status: Former Smoker    Packs/day: 2.00    Years: 28.00    Pack years: 56.00    Types: Cigarettes    Quit date: 05/31/1987    Years since quitting: 33.2  . Smokeless tobacco: Never Used  . Tobacco comment: quit smoking "in my 40's"  Vaping Use  . Vaping Use: Never used  Substance and Sexual Activity  . Alcohol use: Not Currently    Alcohol/week: 0.0 standard drinks    Comment: rare, mikes hard lemonade  . Drug use: Not Currently    Types: Marijuana    Comment: hx of-4-5 yrs ago  . Sexual activity: Not Currently  Other Topics Concern  . Not on file  Social History Narrative   Married 33 years  in 2015. Lived together for 12 years. 2 boys from first wife. 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       Hobbies: yardwork, [place] barn, woodwork, collect knive   Right handed   One story home   Caffeine yes   Social Determinants of Health   Financial Resource Strain: Low Risk   . Difficulty of Paying Living Expenses: Not hard at all  Food Insecurity: No Food Insecurity  . Worried About Charity fundraiser in the Last Year: Never true  . Ran Out of Food in the Last Year: Never true  Transportation Needs: No Transportation Needs  . Lack of Transportation (Medical): No  . Lack of Transportation (Non-Medical): No  Physical Activity: Inactive  . Days of Exercise per Week: 0 days  . Minutes of Exercise per Session: 0 min  Stress: No Stress Concern Present  . Feeling of Stress : Not at all  Social Connections: Moderately Isolated  . Frequency of Communication with Friends and Family: More than three times a week  . Frequency of Social Gatherings with Friends and Family: Once a week  . Attends Religious Services: Never  . Active Member of Clubs or Organizations: No  . Attends Archivist Meetings: Never  . Marital Status: Married   Allergies  Allergen Reactions  . Hydralazine Other (See Comments)    Pt stated he feels drunk  . Mirtazapine Other (See Comments)    Very tired, feeling drunk, loopy  . Sinemet [Carbidopa-Levodopa] Other (See Comments)    GI symptoms    Family History  Problem Relation Age of Onset  . Parkinsonism Father   . Dementia Father   . Cancer Father        Throat  . Colon cancer Mother        died in 66s  . Cancer Mother        Colon     Current Outpatient Medications (Cardiovascular):  .  amLODipine (NORVASC) 10 MG tablet, TAKE 1 TABLET EVERY DAY .  fenofibrate 160 MG tablet, Take 1 tablet (160 mg total) by mouth  daily. .  furosemide (LASIX) 20 MG tablet, TAKE 1 TABLET EVERY DAY .  metoprolol tartrate (LOPRESSOR) 25 MG tablet, TAKE 1 TABLET TWICE DAILY .  rosuvastatin (CRESTOR) 40 MG tablet, Take 1 tablet (40 mg total) by mouth daily.  Current Outpatient Medications (Respiratory):  .  albuterol (VENTOLIN HFA) 108 (90 Base) MCG/ACT inhaler, Inhale 2 puffs into the lungs every 12 (twelve) hours as needed for wheezing or shortness of breath.  Current Outpatient Medications (Analgesics):  .  oxyCODONE-acetaminophen (PERCOCET) 7.5-325 MG tablet, Take 1 tablet by mouth every 8 (eight) hours as needed for severe pain.   Current Outpatient Medications (Other):  .  gabapentin (NEURONTIN) 400 MG capsule, Take 1 capsule (400 mg total) by mouth in the morning and at bedtime. Marland Kitchen  linaclotide (LINZESS) 145 MCG CAPS capsule, Take 145 mcg by mouth as needed (for stomach).  .  Melatonin 10 MG TABS, Take 10 mg by mouth at bedtime. .  tamsulosin (FLOMAX) 0.4 MG CAPS capsule, Take 1 capsule (0.4 mg total) by mouth daily. Marland Kitchen  tiZANidine (ZANAFLEX) 4 MG tablet, TAKE 1 TABLET EVERY 6 HOURS AS NEEDED FOR MUSCLE SPASM(S) .  valACYclovir (VALTREX) 1000 MG tablet, Take 1 tablet (1,000 mg total) by mouth 3 (three) times daily. For 7 days for shingles.   Reviewed prior external information including notes and imaging from  primary care provider As well as notes that were available from care everywhere and other healthcare systems.  Past medical history, social, surgical and family history all reviewed in electronic medical record.  No pertanent information unless stated regarding to the chief complaint.   Review of Systems:  No headache, visual changes, nausea, vomiting, diarrhea, constipation, dizziness, abdominal pain, skin rash, fevers, chills, night sweats, weight loss, swollen lymph nodes, body aches, joint swelling, chest pain, shortness of breath, mood changes. POSITIVE muscle aches  Objective  There were no vitals  taken for this visit.   General: No apparent distress alert and oriented x3 mood  and affect normal, dressed appropriately.  HEENT: Pupils equal, extraocular movements intact  Respiratory: Patient's speak in full sentences and does not appear short of breath  Cardiovascular: No lower extremity edema, non tender, no erythema  Gait normal with good balance and coordination.  MSK:  Non tender with full range of motion and good stability and symmetric strength and tone of shoulders, elbows, wrist, hip, knee and ankles bilaterally.     Impression and Recommendations:     The above documentation has been reviewed and is accurate and complete Kevin [name]

## 2020-08-05 ENCOUNTER — Ambulatory Visit: Payer: Medicare HMO | Admitting: Family Medicine

## 2020-08-11 ENCOUNTER — Other Ambulatory Visit: Payer: Self-pay

## 2020-08-11 ENCOUNTER — Ambulatory Visit: Payer: Medicare HMO | Admitting: Family Medicine

## 2020-08-11 ENCOUNTER — Encounter: Payer: Self-pay | Admitting: Family Medicine

## 2020-08-11 DIAGNOSIS — M170 Bilateral primary osteoarthritis of knee: Secondary | ICD-10-CM

## 2020-08-11 MED ORDER — GABAPENTIN 100 MG PO CAPS: 100.0000 mg | ORAL_CAPSULE | Freq: Two times a day (BID) | ORAL | 0 refills | Status: DC

## 2020-08-11 NOTE — Progress Notes (Signed)
[facility] [address] [place] Phone: [phone] Subjective:   I Kevin [name] am serving as a Education administrator for Dr. Hulan Saas.  This visit occurred during the SARS-CoV-2 public health emergency.  Safety protocols were in place, including screening questions prior to the visit, additional usage of staff PPE, and extensive cleaning of exam room while observing appropriate contact time as indicated for disinfecting solutions.   I'm seeing this patient by the request  of:  Marin Olp, MD  CC: Bilateral knee pain and multiple other complaints follow-up  POE:[id number]   07/08/2020 Patient given injection and tolerated the procedure well, discussed icing regimen and home exercises, increase activity slowly.  Discussed icing regimen.  Could be secondary to more of the radicular symptoms in the back again and we did discuss potentially repeating the epidural.  This was ordered as well.  Follow-up with me again 6 to 8 weeks  Likely contributing to some of her aches and pains as well secondary to the hypertonicity.  Patient has significant arthritic changes but has responded to the epidurals previously.  Patient's pain is more localized with less radicular symptoms.  Patient will have another epidural if needed okay.  Patient feels that the oxycodone is not working.  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  Given another injection in the left side.  Patient has responded in the past and hopefully will again.  Patient wants to avoid any surgical intervention with his other comorbidities.  Patient encouraged to wear the brace on a more regular basis.  Follow-up with me again in 10 weeks.  Patient does see pain management and encouraged him to continue to see them and make adjustments accordingly.  Update 08/11/2020 Kevin [name] is a 80 y.o. male coming in with  complaint of bilateral knee pain. Patient states he has some questions about his knees. States the right hip is killing him.  Patient has been given injections in that area.  Patient is wondering if the knees of the back is contributing to more of the discomfort and pain.     Past Medical History:  Diagnosis Date  . Arthritis    DDD- Lower Back  . Bradycardia   . CAP (community acquired pneumonia) 03/17/2015   "THAT'S WHAT THEY ARE THINKING; THEY ARE NOT SURE"  . Carotid artery occlusion   . Chest pain 06/19/2018  . Chronic lower back pain    DDD  . CKD (chronic kidney disease)    Dr. Corliss Parish  . 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 Diverticulosis   . Enlarged prostate    takes Flomax daily  . History of blood transfusion    "probably when they did aortic aneurysm"  . History of colon polyps    benign  . Hyperlipidemia   . Hypertension    takes Amlodipine and Zebeta daily  . Insomnia    takes Melatonin nightly  . Joint pain   . 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     . Muscle spasm    takes Zanaflex daily as needed  . Peripheral edema    takes Furosemide daily  . Pneumonia    hx of   .  Rheumatic fever 1946  . Rheumatoid arthritis ([place])   . Short-term memory loss    minimal  . Shortness of breath dyspnea    with exertion but lasts very short period of time  . TIA (transient ischemic attack)    x 2;takes Plavix daily  . Urinary frequency    takes Flomax daily  . Urinary urgency    Past Surgical History:  Procedure Laterality Date  . ABDOMINAL AORTIC ANEURYSM REPAIR  2010  . CAROTID ENDARTERECTOMY Left 09/20/2004  . CATARACT EXTRACTION, BILATERAL     2016-2017  . COLONOSCOPY    . 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 ENDARTERECTOMY Right 10/12/2015   Procedure: RIGHT CAROTID ENDARTERECTOMY WITH PATCH ANGIOPLASTY;  Surgeon: Elam Dutch, MD;  Location: [facility];  Service: Vascular;  Laterality: Right;  . INGUINAL HERNIA REPAIR Left 02/05/2013   Procedure: HERNIA REPAIR INGUINAL ADULT;  Surgeon: Joyice Faster. Cornett, MD;  Location: [facility];  Service: General;  Laterality: Left;  . INGUINAL HERNIA REPAIR Left   . INSERTION OF MESH Left 02/05/2013   Procedure: INSERTION OF MESH;  Surgeon: Joyice Faster. Cornett, MD;  Location: [facility];  Service: General;  Laterality: Left;  . LEFT HEART CATH AND CORONARY ANGIOGRAPHY N/A 06/21/2018   Procedure: LEFT HEART CATH AND CORONARY ANGIOGRAPHY;  Surgeon: Jettie Booze, MD;  Location: [facility];  Service: Cardiovascular;  Laterality: N/A;  . SHOULDER ARTHROSCOPY WITH ROTATOR CUFF REPAIR AND SUBACROMIAL DECOMPRESSION Left 05/01/2014   Procedure: LEFT ARTHROSCOPY SHOULDER SUBACROMIAL DECOMPRESSION,DISTAL CLAVICAL RESECTION AND ROTATOR CUFF REPAIR;  Surgeon: Marin Shutter, MD;  Location: [facility];  Service: Orthopedics;  Laterality: Left;  . TEE WITHOUT CARDIOVERSION N/A 06/27/2018   Procedure: TRANSESOPHAGEAL ECHOCARDIOGRAM (TEE);  Surgeon: Melrose Nakayama, MD;  Location: [facility];  Service: Open Heart Surgery;  Laterality: N/A;  . TESTICLAR CYST EXCISION Left   . TONSILLECTOMY    . VASECTOMY     Social History   Socioeconomic History  . Marital status: Married    Spouse name: Not on file  . Number of children: Not on file  . Years of education: Not on file  . Highest education level: Not on file  Occupational History  . Occupation: retired    Comment: maintenance; electrician  Tobacco Use   . Smoking status: Former Smoker    Packs/day: 2.00    Years: 28.00    Pack years: 56.00    Types: Cigarettes    Quit date: 05/31/1987    Years since quitting: 33.2  . Smokeless tobacco: Never Used  . Tobacco comment: quit smoking "in my 40's"  Vaping Use  . Vaping Use: Never used  Substance and Sexual Activity  . Alcohol use: Not Currently    Alcohol/week: 0.0 standard drinks    Comment: rare, mikes hard lemonade  . Drug use: Not Currently    Types: Marijuana    Comment: hx of-4-5 yrs ago  . Sexual activity: Not Currently  Other Topics Concern  . Not on file  Social History Narrative   Married 33 years in 2015. Lived together for 12 years. 2 boys from first wife. 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       Hobbies: yardwork, [place] barn, woodwork, collect knive   Right handed   One story home   Caffeine yes   Social Determinants of Health   Financial Resource Strain: Low Risk   . Difficulty of Paying Living Expenses: Not hard at all  Food Insecurity: No Food Insecurity  . Worried About Charity fundraiser in the Last Year: Never true  . Ran Out of Food in the Last Year: Never true  Transportation Needs: No Transportation Needs  . Lack of Transportation (Medical): No  . Lack of Transportation (Non-Medical): No  Physical Activity: Inactive  . Days of Exercise per Week: 0 days  . Minutes of Exercise per Session: 0 min  Stress: No Stress Concern Present  . Feeling of Stress : Not at all  Social Connections: Moderately Isolated  . Frequency of Communication with Friends and Family: More than three times a week  . Frequency of Social Gatherings with Friends and Family: Once a week  . Attends Religious Services: Never  . Active Member of Clubs or Organizations: No  . Attends Archivist Meetings: Never  . Marital Status: Married   Allergies   Allergen Reactions  . Hydralazine Other (See Comments)    Pt stated he feels drunk  . Mirtazapine Other (See Comments)    Very tired, feeling drunk, loopy  . Sinemet [Carbidopa-Levodopa] Other (See Comments)    GI symptoms    Family History  Problem Relation Age of Onset  . Parkinsonism Father   . Dementia Father   . Cancer Father        Throat  . Colon cancer Mother        died in 73s  . Cancer Mother        Colon     Current Outpatient Medications (Cardiovascular):  .  amLODipine (NORVASC) 10 MG tablet, TAKE 1 TABLET EVERY DAY .  fenofibrate 160 MG tablet, Take 1 tablet (160 mg total) by mouth daily. .  furosemide (LASIX) 20 MG tablet, TAKE 1 TABLET EVERY DAY .  metoprolol tartrate (LOPRESSOR) 25 MG tablet, TAKE 1 TABLET TWICE DAILY .  rosuvastatin (CRESTOR) 40 MG tablet, Take 1 tablet (40 mg total) by mouth daily.  Current Outpatient Medications (Respiratory):  .  albuterol (VENTOLIN HFA) 108 (90 Base) MCG/ACT inhaler, Inhale 2 puffs into the lungs every 12 (twelve) hours as needed for wheezing or shortness of breath.  Current Outpatient Medications (Analgesics):  .  oxyCODONE-acetaminophen (PERCOCET) 7.5-325 MG tablet, Take 1 tablet by mouth every 8 (eight) hours as needed for severe pain.   Current Outpatient Medications (Other):  .  gabapentin (NEURONTIN) 100 MG capsule, Take 1 capsule (100 mg total) by mouth 2 (two) times daily. Marland Kitchen  gabapentin (NEURONTIN) 400 MG capsule, Take 1 capsule (400 mg total) by mouth in the morning and at bedtime. Marland Kitchen  linaclotide (LINZESS) 145 MCG CAPS capsule, Take 145 mcg by mouth as needed (for stomach).  .  Melatonin 10 MG TABS, Take 10 mg by mouth at bedtime. .  tamsulosin (FLOMAX) 0.4 MG CAPS capsule, Take 1 capsule (0.4 mg total) by mouth daily. Marland Kitchen  tiZANidine (ZANAFLEX) 4 MG tablet, TAKE 1 TABLET EVERY 6 HOURS AS NEEDED FOR MUSCLE SPASM(S) .  valACYclovir (VALTREX) 1000 MG tablet, Take 1 tablet (1,000 mg total) by mouth 3 (three) times  daily. For 7 days for  shingles.   Reviewed prior external information including notes and imaging from  primary care provider As well as notes that were available from care everywhere and other healthcare systems.  Past medical history, social, surgical and family history all reviewed in electronic medical record.  No pertanent information unless stated regarding to the chief complaint.   Review of Systems:  No headache, visual changes, nausea, vomiting, diarrhea, constipation, dizziness, abdominal pain, skin rash, fevers, chills, night sweats, weight loss, swollen lymph nodes, body aches, joint swelling, chest pain, shortness of breath, mood changes. POSITIVE muscle aches  Objective  Blood pressure 140/70, pulse 70, height 5\' 7"  (1.702 m), weight 162 lb (73.5 kg), SpO2 99 %.   General: No apparent distress alert and oriented x3 mood and affect normal, dressed appropriately.  Masked facies noted.  Resting tremor noted. HEENT: Pupils equal, extraocular movements intact  Respiratory: Patient's speak in full sentences and does not appear short of breath  Cardiovascular: No lower extremity edema, non tender, no erythema  Gait ambulates with the aid of a cane. Patient does have significant hypertrophy noted.  Some mild cogwheeling noted. Knee exams do have arthritic changes of the knees bilaterally.  Medial pain with more than lateral.  Does have some crepitus noted.  Instability with valgus and varus force.  Difficult with range of motion testing secondary to hypertonicity of the muscles  After informed written and verbal consent, patient was seated on exam table. Right knee was prepped with alcohol swab and utilizing anterolateral approach, patient's right knee space was injected with 48 mg per 3 mL of Monovisc (sodium hyaluronate) in a prefilled syringe was injected easily into the knee through a 22-gauge needle..Patient tolerated the procedure well without immediate complications.  After  informed written and verbal consent, patient was seated on exam table. Left knee was prepped with alcohol swab and utilizing anterolateral approach, patient's left knee space was injected with 40 mg per 3 mL of Monovisc (sodium hyaluronate) in a prefilled syringe was injected easily into the knee through a 22-gauge needle..Patient tolerated the procedure well without immediate complications.   Impression and Recommendations:     The above documentation has been reviewed and is accurate and complete Lyndal Pulley, DO

## 2020-08-11 NOTE — Patient Instructions (Addendum)
Good to see you Gel injections today Ice is your friend Will take a month to work well See me again in 6 weeks if you are in town

## 2020-08-11 NOTE — Assessment & Plan Note (Signed)
Bilateral viscosupplementation given today.  Patient does have known arthritic changes of the knees.  Patient has many different comorbidities that does make treatment significantly difficult.  Discussed changing patient's gabapentin to 400 mg at night and the 200 mg during the day.  We also discussed iron supplementation with patient still having cramping at night.  Follow-up with me again in 4-8 weeks

## 2020-08-13 ENCOUNTER — Other Ambulatory Visit: Payer: Self-pay

## 2020-08-13 ENCOUNTER — Encounter: Payer: Medicare HMO | Attending: Physical Medicine & Rehabilitation | Admitting: Registered Nurse

## 2020-08-13 ENCOUNTER — Encounter: Payer: Self-pay | Admitting: Registered Nurse

## 2020-08-13 VITALS — BP 131/75 | HR 97 | Temp 98.9°F | Ht 67.0 in | Wt 161.0 lb

## 2020-08-13 DIAGNOSIS — Z79891 Long term (current) use of opiate analgesic: Secondary | ICD-10-CM | POA: Diagnosis not present

## 2020-08-13 DIAGNOSIS — M1712 Unilateral primary osteoarthritis, left knee: Secondary | ICD-10-CM | POA: Diagnosis not present

## 2020-08-13 DIAGNOSIS — M5416 Radiculopathy, lumbar region: Secondary | ICD-10-CM

## 2020-08-13 DIAGNOSIS — M7061 Trochanteric bursitis, right hip: Secondary | ICD-10-CM

## 2020-08-13 DIAGNOSIS — G894 Chronic pain syndrome: Secondary | ICD-10-CM | POA: Diagnosis not present

## 2020-08-13 DIAGNOSIS — Z5181 Encounter for therapeutic drug level monitoring: Secondary | ICD-10-CM

## 2020-08-13 DIAGNOSIS — M47816 Spondylosis without myelopathy or radiculopathy, lumbar region: Secondary | ICD-10-CM | POA: Diagnosis not present

## 2020-08-13 MED ORDER — OXYCODONE-ACETAMINOPHEN 7.5-325 MG PO TABS: 1.0000 | ORAL_TABLET | Freq: Three times a day (TID) | ORAL | 0 refills | Status: DC | PRN

## 2020-08-13 NOTE — Progress Notes (Signed)
Subjective:    Patient ID: Kevin [name], male    DOB: [birth year], 80 y.o.   MRN: [id number]  HPI: Kevin [name] is a 80 y.o. male who returns for follow up appointment for chronic pain and medication refill. He states his pain is located in his  Lower back radiating into her right lower extremity, right hip pain and left knee pain. He rates his pain 8. His current exercise regime is walking and performing stretching exercises.  Kevin [name] in room, all questions answered.   Kevin [name] equivalent is 30.00 MME.  UDS ordered today.    Pain Inventory Average Pain 8 Pain Right Now 8 My pain is constant, sharp, stabbing and aching  In the last 24 hours, has pain interfered with the following? General activity 10 Relation with others 9 Enjoyment of life 10 What TIME of day is your pain at its worst? daytime and evening Sleep (in general) Fair  Pain is worse with: walking, bending, sitting and standing Pain improves with: rest, medication and injections Relief from Meds: 5  Family History  Problem Relation Age of Onset  . Parkinsonism Father   . Dementia Father   . Cancer Father        Throat  . Colon cancer Mother        died in 26s  . Cancer Mother        Colon   Social History   Socioeconomic History  . Marital status: Married    Spouse name: Not on file  . Number of children: Not on file  . Years of education: Not on file  . Highest education level: Not on file  Occupational History  . Occupation: retired    Comment: maintenance; electrician  Tobacco Use  . Smoking status: Former Smoker    Packs/day: 2.00    Years: 28.00    Pack years: 56.00    Types: Cigarettes    Quit date: 05/31/1987    Years since quitting: 33.2  . Smokeless tobacco: Never Used  . Tobacco comment: quit smoking "in my 40's"  Vaping Use  . Vaping Use: Never used  Substance and Sexual Activity  . Alcohol use: Not Currently    Alcohol/week: 0.0 standard drinks    Comment: rare, mikes  hard lemonade  . Drug use: Not Currently    Types: Marijuana    Comment: hx of-4-5 yrs ago  . Sexual activity: Not Currently  Other Topics Concern  . Not on file  Social History Narrative   Married 33 years in 2015. Lived together for 12 years. 2 boys from first wife. 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       Hobbies: yardwork, [place] barn, woodwork, collect knive   Right handed   One story home   Caffeine yes   Social Determinants of Health   Financial Resource Strain: Low Risk   . Difficulty of Paying Living Expenses: Not hard at all  Food Insecurity: No Food Insecurity  . Worried About Charity fundraiser in the Last Year: Never true  . Ran Out of Food in the Last Year: Never true  Transportation Needs: No Transportation Needs  . Lack of Transportation (Medical): No  . Lack of Transportation (Non-Medical): No  Physical Activity: Inactive  . Days of Exercise per Week: 0 days  .  Minutes of Exercise per Session: 0 min  Stress: No Stress Concern Present  . Feeling of Stress : Not at all  Social Connections: Moderately Isolated  . Frequency of Communication with Friends and Family: More than three times a week  . Frequency of Social Gatherings with Friends and Family: Once a week  . Attends Religious Services: Never  . Active Member of Clubs or Organizations: No  . Attends Archivist Meetings: Never  . Marital Status: Married   Past Surgical History:  Procedure Laterality Date  . ABDOMINAL AORTIC ANEURYSM REPAIR  2010  . CAROTID ENDARTERECTOMY Left 09/20/2004  . CATARACT EXTRACTION, BILATERAL     2016-2017  . COLONOSCOPY    . 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 ENDARTERECTOMY Right 10/12/2015   Procedure: RIGHT CAROTID ENDARTERECTOMY WITH PATCH ANGIOPLASTY;  Surgeon: Elam Dutch, MD;  Location: [facility];  Service: Vascular;  Laterality: Right;  . INGUINAL HERNIA REPAIR Left 02/05/2013   Procedure: HERNIA REPAIR INGUINAL ADULT;  Surgeon: Joyice Faster. Cornett, MD;  Location: [facility];  Service: General;  Laterality: Left;  . INGUINAL HERNIA REPAIR Left   . INSERTION OF MESH Left 02/05/2013   Procedure: INSERTION OF MESH;  Surgeon: Joyice Faster. Cornett, MD;  Location: [facility];  Service: General;  Laterality: Left;  . LEFT HEART CATH AND CORONARY ANGIOGRAPHY N/A 06/21/2018   Procedure: LEFT HEART CATH AND CORONARY ANGIOGRAPHY;  Surgeon: Jettie Booze, MD;  Location: [facility];  Service: Cardiovascular;  Laterality: N/A;  . SHOULDER ARTHROSCOPY WITH ROTATOR CUFF REPAIR AND SUBACROMIAL DECOMPRESSION Left 05/01/2014   Procedure: LEFT ARTHROSCOPY SHOULDER SUBACROMIAL DECOMPRESSION,DISTAL CLAVICAL RESECTION AND ROTATOR CUFF REPAIR;  Surgeon: Marin Shutter, MD;  Location: [facility];  Service: Orthopedics;  Laterality: Left;  . TEE WITHOUT CARDIOVERSION N/A 06/27/2018   Procedure: TRANSESOPHAGEAL ECHOCARDIOGRAM (TEE);  Surgeon: Melrose Nakayama, MD;  Location: [facility];  Service: Open Heart Surgery;  Laterality: N/A;  . TESTICLAR CYST EXCISION Left   . TONSILLECTOMY    . VASECTOMY     Past Surgical History:  Procedure Laterality Date  . ABDOMINAL AORTIC ANEURYSM REPAIR  2010  . CAROTID ENDARTERECTOMY Left 09/20/2004  . CATARACT EXTRACTION, BILATERAL     2016-2017  . COLONOSCOPY    . 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 ENDARTERECTOMY Right 10/12/2015   Procedure: RIGHT CAROTID ENDARTERECTOMY WITH PATCH ANGIOPLASTY;  Surgeon: Elam Dutch, MD;  Location: [facility];  Service: Vascular;  Laterality: Right;  . INGUINAL HERNIA REPAIR Left 02/05/2013   Procedure: HERNIA REPAIR INGUINAL ADULT;  Surgeon: Joyice Faster. Cornett, MD;  Location: [facility];  Service: General;  Laterality: Left;  . INGUINAL HERNIA REPAIR Left   . INSERTION OF MESH Left 02/05/2013   Procedure: INSERTION OF MESH;  Surgeon: Joyice Faster. Cornett, MD;  Location: [facility];  Service: General;  Laterality: Left;  . LEFT HEART CATH AND CORONARY ANGIOGRAPHY N/A 06/21/2018  Procedure: LEFT HEART CATH AND CORONARY ANGIOGRAPHY;  Surgeon: Jettie Booze, MD;  Location: [facility];  Service: Cardiovascular;  Laterality: N/A;  . SHOULDER ARTHROSCOPY WITH ROTATOR CUFF REPAIR AND SUBACROMIAL DECOMPRESSION Left 05/01/2014   Procedure: LEFT ARTHROSCOPY SHOULDER SUBACROMIAL DECOMPRESSION,DISTAL CLAVICAL RESECTION AND ROTATOR CUFF REPAIR;  Surgeon: Marin Shutter, MD;  Location: [facility];  Service: Orthopedics;  Laterality: Left;  . TEE WITHOUT CARDIOVERSION N/A 06/27/2018   Procedure: TRANSESOPHAGEAL ECHOCARDIOGRAM (TEE);  Surgeon: Melrose Nakayama, MD;  Location: [facility];  Service: Open Heart Surgery;  Laterality: N/A;  . TESTICLAR CYST EXCISION Left   . TONSILLECTOMY    . VASECTOMY     Past Medical History:  Diagnosis Date  . Arthritis    DDD- Lower Back  . Bradycardia   . CAP (community acquired pneumonia) 03/17/2015   "THAT'S WHAT THEY ARE THINKING; THEY ARE NOT SURE"  . Carotid artery occlusion   . Chest pain 06/19/2018  . Chronic lower back pain    DDD  . CKD (chronic kidney disease)    Dr. Corliss Parish  . 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 Diverticulosis   . Enlarged prostate    takes Flomax daily  . History of blood transfusion    "probably when they did aortic aneurysm"  . History of colon polyps    benign  . Hyperlipidemia   . Hypertension    takes Amlodipine and Zebeta daily  . Insomnia    takes Melatonin nightly  . Joint pain   . 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     . Muscle spasm    takes Zanaflex daily as needed  . Peripheral edema    takes Furosemide daily  . Pneumonia    hx of   . Rheumatic fever 1946  . Rheumatoid arthritis ([place])   . Short-term memory loss    minimal  . Shortness of breath dyspnea    with exertion but lasts very short period of time  . TIA (transient ischemic attack)    x 2;takes Plavix daily  . Urinary frequency    takes Flomax daily  . Urinary urgency    There were no vitals taken for this visit.  Opioid Risk Score:   Fall Risk Score:  `1  Depression screen PHQ 2/9  Depression screen [facility] 2/9 06/11/2020 06/05/2020 05/11/2020 04/09/2020 03/11/2020 01/09/2020 12/13/2019  Decreased Interest 0 0 0 1 0 0 0  Down, Depressed, Hopeless 0 0 0 1 0 0 0  PHQ - 2 Score 0 0 0 2 0 0 0  Altered sleeping - - - - - - -  Tired, decreased energy - - - - - - -  Change in appetite - - - - - - -  Feeling bad or failure about yourself  - - - - - - -  Trouble concentrating - - - - - - -  Moving slowly or fidgety/restless - - - - - - -  PHQ-9 Score - - - - - - -  Some recent data might be hidden    Review of Systems  Musculoskeletal: Positive for back pain and gait problem.       Pain in hips & knees  All other systems reviewed and are negative.  Objective:   Physical Exam Vitals and nursing note reviewed.  Constitutional:      Appearance: Normal appearance.  Cardiovascular:     Rate and Rhythm: Normal rate and regular rhythm.     Pulses: Normal pulses.     Heart sounds: Normal  heart sounds.  Pulmonary:     Effort: Pulmonary effort is normal.     Breath sounds: Normal breath sounds.  Musculoskeletal:     Cervical back: Normal range of motion and neck supple.     Comments: Normal Muscle Bulk and Muscle Testing Reveals:  Upper Extremities: Full ROM and Muscle Strength 5/5 Bilateral AC Joint Tenderness Lumbar Hypersensitivity Right Greater Trochanter Tenderness Lower Extremities: Full ROM and Muscle Strength 5/5 Arises from Table slowly using cane for support Antalgic  Gait   Skin:    General: Skin is warm and dry.  Neurological:     Mental Status: He is alert and oriented to person, place, and time.  Psychiatric:        Mood and Affect: Mood normal.        Behavior: Behavior normal.           Assessment & Plan:  1. Lumbar Spondylosis/ Lumbar Stenosis:08/13/2020 Continue current medication regime. Refilled:Oxycodone 7.5/325 mg one tabletevery 8 hoursas needed #80.08/13/2020. Second script sent to accommodate scheduled appointment . We will continue the opioid monitoring program, this consists of regular clinic visits, examinations, urine drug screen, pill counts as well as use of [place] Controlled Substance Reporting system. A 12 month History has been reviewed on the [place] Controlled Substance Reporting Systemon03/17/2022. 2.Bilateral Knees OA/ChronicBilateralKnee Pain:No complaints today.S/PBilateral KneeInjection by DrSmithon 12/09/2019, Orthopedist Following03/17/2022. 3.Right Greater Trochanter Bursitis:Orthopedist Following.Continue to alternate Ice and Heat Therapy.08/13/2020 4. Chronic Left Leg Pain: No complaints today. Continue Current Medication Regimen. Continue to Monitor.08/13/2020   F/U in 1 month

## 2020-08-20 ENCOUNTER — Ambulatory Visit: Payer: Medicare HMO | Admitting: Family Medicine

## 2020-08-23 LAB — TOXASSURE SELECT,+ANTIDEPR,UR

## 2020-08-25 ENCOUNTER — Telehealth: Payer: Self-pay | Admitting: *Deleted

## 2020-08-25 NOTE — Telephone Encounter (Signed)
Urine drug screen for this encounter is consistent for prescribed medication 

## 2020-08-28 ENCOUNTER — Telehealth: Payer: Self-pay | Admitting: Family Medicine

## 2020-08-28 NOTE — Progress Notes (Signed)
  Chronic Care Management   Outreach Note  08/28/2020 Name: WILBORN MEMBRENO MRN: [id number] DOB: Apr 22, 1941  Referred by: Marin Olp, MD Reason for referral : No chief complaint on file.   An unsuccessful telephone outreach was attempted today. The patient was referred to the pharmacist for assistance with care management and care coordination.   Follow Up Plan:   Lauretta Grill Upstream Scheduler

## 2020-09-14 ENCOUNTER — Other Ambulatory Visit: Payer: Self-pay

## 2020-09-14 ENCOUNTER — Telehealth: Payer: Self-pay

## 2020-09-14 MED ORDER — TAMSULOSIN HCL 0.4 MG PO CAPS: 0.4000 mg | ORAL_CAPSULE | Freq: Every day | ORAL | 3 refills | Status: DC

## 2020-09-14 NOTE — Telephone Encounter (Signed)
..   LAST APPOINTMENT DATE: 08/28/2020   NEXT APPOINTMENT DATE:@4 /25/2022  MEDICATION:tamsulosin (FLOMAX) 0.4 MG CAPS capsule    PHARMACY: [facility] [address], [place] [address]

## 2020-09-14 NOTE — Telephone Encounter (Signed)
Medication has been refilled.

## 2020-09-15 ENCOUNTER — Telehealth: Payer: Self-pay | Admitting: Family Medicine

## 2020-09-15 NOTE — Progress Notes (Signed)
  Chronic Care Management   Outreach Note  09/15/2020 Name: LYLE NIBLETT MRN: [id number] DOB: 10-24-1940  Referred by: Marin Olp, MD Reason for referral : No chief complaint on file.   A second unsuccessful telephone outreach was attempted today. The patient was referred to pharmacist for assistance with care management and care coordination.  Follow Up Plan:   Lauretta Grill Upstream Scheduler

## 2020-09-21 ENCOUNTER — Other Ambulatory Visit: Payer: Self-pay

## 2020-09-21 ENCOUNTER — Ambulatory Visit (INDEPENDENT_AMBULATORY_CARE_PROVIDER_SITE_OTHER): Payer: Medicare HMO | Admitting: Family Medicine

## 2020-09-21 ENCOUNTER — Encounter: Payer: Self-pay | Admitting: Family Medicine

## 2020-09-21 VITALS — BP 136/74 | HR 61 | Temp 98.3°F | Wt 161.6 lb

## 2020-09-21 DIAGNOSIS — I5032 Chronic diastolic (congestive) heart failure: Secondary | ICD-10-CM | POA: Diagnosis not present

## 2020-09-21 DIAGNOSIS — E785 Hyperlipidemia, unspecified: Secondary | ICD-10-CM

## 2020-09-21 DIAGNOSIS — I10 Essential (primary) hypertension: Secondary | ICD-10-CM | POA: Diagnosis not present

## 2020-09-21 DIAGNOSIS — N184 Chronic kidney disease, stage 4 (severe): Secondary | ICD-10-CM | POA: Diagnosis not present

## 2020-09-21 DIAGNOSIS — I2510 Atherosclerotic heart disease of native coronary artery without angina pectoris: Secondary | ICD-10-CM

## 2020-09-21 MED ORDER — ALBUTEROL SULFATE HFA 108 (90 BASE) MCG/ACT IN AERS: 2.0000 | INHALATION_SPRAY | Freq: Two times a day (BID) | RESPIRATORY_TRACT | 3 refills | Status: AC | PRN

## 2020-09-21 MED ORDER — FUROSEMIDE 20 MG PO TABS: 20.0000 mg | ORAL_TABLET | Freq: Every day | ORAL | 3 refills | Status: DC

## 2020-09-21 NOTE — Patient Instructions (Addendum)
Could try voltaren gel/diclofenac gel for knees. Does not work for deep joint like the hips. With kidney disease if you wanted to start with just twice a day thatd be reasonable.   We are going to miss you so much! Let me know if you need anything on the meds- please do not run out of anything in your transition

## 2020-09-21 NOTE — Progress Notes (Signed)
[place] Phone: [phone] Subjective:   Kevin [name], am serving as a scribe for Dr. Hulan Saas. This visit occurred during the SARS-CoV-2 public health emergency.  Safety protocols were in place, including screening questions prior to the visit, additional usage of staff PPE, and extensive cleaning of exam room while observing appropriate contact time as indicated for disinfecting solutions.   I'm seeing this patient by the request  of:  Marin Olp, MD  CC: Multiple complaints follow-up  [id number]   08/11/2020 Bilateral viscosupplementation given today.  Patient does have known arthritic changes of the knees.  Patient has many different comorbidities that does make treatment significantly difficult.  Discussed changing patient's gabapentin to 400 mg at night and the 200 mg during the day.  We also discussed iron supplementation with patient still having cramping at night.  Follow-up with me again in 4-8 weeks  Update 09/22/2020 Kevin [name] is a 80 y.o. male coming in with complaint of B knee pain. Patient states that his pain is not any better than last visit.  Patient was given viscosupplementation in the knees bilaterally.  Feels the right knee may be better but the left knee is [name] better whatsoever.  States that R hip pain has increased and is masking the knee pain.  Dr. [name] placed patient back on fluid pills due to swelling of ankles and feet.   Would like injections in L knee and R hip.   Having bad cramps at night that wakes him up night. Stretches to alleviate his pain.      Past Medical History:  Diagnosis Date  . Arthritis    DDD- Lower Back  . Bradycardia   . CAP (community acquired pneumonia) 03/17/2015   "THAT'S WHAT THEY ARE THINKING; THEY ARE NOT SURE"  . Carotid artery occlusion   . Chest pain 06/19/2018  . Chronic lower back pain    DDD  . CKD (chronic kidney  disease)    Dr. Corliss Parish  . CKD (chronic kidney disease), stage III ([place]) 01/27/2014   GFR just above 30--> just below 30 11/10/14 refer to nephrology Creatinine 2 baseline but went up to 2.5 peak Likely RAS- lisinopril not great choice   . Diverticulosis   . Enlarged prostate    takes Flomax daily  . History of blood transfusion    "probably when they did aortic aneurysm"  . History of colon polyps    benign  . Hyperlipidemia   . Hypertension    takes Amlodipine and Zebeta daily  . Insomnia    takes Melatonin nightly  . Joint pain   . 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     . Muscle spasm    takes Zanaflex daily as needed  . Peripheral edema    takes Furosemide daily  . Pneumonia    hx of   . Rheumatic fever 1946  . Rheumatoid arthritis ([place])   . Short-term memory loss    minimal  . Shortness of breath dyspnea    with exertion but lasts very short period of time  . TIA (transient ischemic attack)    x 2;takes Plavix daily  . Urinary frequency    takes Flomax daily  . Urinary urgency    Past Surgical History:  Procedure Laterality Date  . ABDOMINAL AORTIC ANEURYSM REPAIR  2010  . CAROTID ENDARTERECTOMY Left 09/20/2004  . CATARACT EXTRACTION, BILATERAL  2016-2017  . COLONOSCOPY    . CORONARY ARTERY BYPASS GRAFT N/A 06/27/2018   Procedure: CORONARY ARTERY BYPASS GRAFTING (CABG), ON PUMP, TIMES FOUR, USING LEFT INTERNAL MAMMARY ARTERY AND ENDOSCOPICALLY HARVESTED LEFT GREATER SAPHENOUS VEIN;  Surgeon: Melrose Nakayama, MD;  Location: [facility];  Service: Open Heart Surgery;  Laterality: N/A;  . ENDARTERECTOMY Right 10/12/2015   Procedure: RIGHT CAROTID ENDARTERECTOMY WITH PATCH ANGIOPLASTY;  Surgeon: Elam Dutch, MD;  Location: [facility];  Service: Vascular;  Laterality: Right;  . INGUINAL HERNIA REPAIR Left 02/05/2013   Procedure: HERNIA REPAIR INGUINAL ADULT;  Surgeon: Joyice Faster. Cornett, MD;  Location: [facility];   Service: General;  Laterality: Left;  . INGUINAL HERNIA REPAIR Left   . INSERTION OF MESH Left 02/05/2013   Procedure: INSERTION OF MESH;  Surgeon: Joyice Faster. Cornett, MD;  Location: [facility];  Service: General;  Laterality: Left;  . LEFT HEART CATH AND CORONARY ANGIOGRAPHY N/A 06/21/2018   Procedure: LEFT HEART CATH AND CORONARY ANGIOGRAPHY;  Surgeon: Jettie Booze, MD;  Location: [facility];  Service: Cardiovascular;  Laterality: N/A;  . SHOULDER ARTHROSCOPY WITH ROTATOR CUFF REPAIR AND SUBACROMIAL DECOMPRESSION Left 05/01/2014   Procedure: LEFT ARTHROSCOPY SHOULDER SUBACROMIAL DECOMPRESSION,DISTAL CLAVICAL RESECTION AND ROTATOR CUFF REPAIR;  Surgeon: Marin Shutter, MD;  Location: [facility];  Service: Orthopedics;  Laterality: Left;  . TEE WITHOUT CARDIOVERSION N/A 06/27/2018   Procedure: TRANSESOPHAGEAL ECHOCARDIOGRAM (TEE);  Surgeon: Melrose Nakayama, MD;  Location: [facility];  Service: Open Heart Surgery;  Laterality: N/A;  . TESTICLAR CYST EXCISION Left   . TONSILLECTOMY    . VASECTOMY     Social History   Socioeconomic History  . Marital status: Married    Spouse name: Not on file  . Number of children: Not on file  . Years of education: Not on file  . Highest education level: Not on file  Occupational History  . Occupation: retired    Comment: maintenance; electrician  Tobacco Use  . Smoking status: Former Smoker    Packs/day: 2.00    Years: 28.00    Pack years: 56.00    Types: Cigarettes    Quit date: 05/31/1987    Years since quitting: 33.3  . Smokeless tobacco: Never Used  . Tobacco comment: quit smoking "in my 40's"  Vaping Use  . Vaping Use: Never used  Substance and Sexual Activity  . Alcohol use: Not Currently    Alcohol/week: 0.0 standard drinks    Comment: rare, mikes hard lemonade  . Drug use: Not Currently    Types: Marijuana    Comment: hx of-4-5 yrs ago  . Sexual activity: Not Currently  Other Topics Concern  . Not on file   Social History Narrative   Married 33 years in 2015. Lived together for 12 years. 2 boys from first wife. 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       Hobbies: yardwork, [place] barn, woodwork, collect knive   Right handed   One story home   Caffeine yes   Social Determinants of Health   Financial Resource Strain: Low Risk   . Difficulty of Paying Living Expenses: Not hard at all  Food Insecurity: [name] Food Insecurity  . Worried About Charity fundraiser in the Last Year: Never true  . Ran Out  of Food in the Last Year: Never true  Transportation Needs: [name] Transportation Needs  . Lack of Transportation (Medical): [name]  . Lack of Transportation (Non-Medical): [name]  Physical Activity: Inactive  . Days of Exercise per Week: 0 days  . Minutes of Exercise per Session: 0 min  Stress: [name] Stress Concern Present  . Feeling of Stress : Not at all  Social Connections: Moderately Isolated  . Frequency of Communication with Friends and Family: More than three times a week  . Frequency of Social Gatherings with Friends and Family: Once a week  . Attends Religious Services: Never  . Active Member of Clubs or Organizations: [name]  . Attends Archivist Meetings: Never  . Marital Status: Married   Allergies  Allergen Reactions  . Hydralazine Other (See Comments)    Pt stated he feels drunk  . Mirtazapine Other (See Comments)    Very tired, feeling drunk, loopy  . Sinemet [Carbidopa-Levodopa] Other (See Comments)    GI symptoms    Family History  Problem Relation Age of Onset  . Parkinsonism Father   . Dementia Father   . Cancer Father        Throat  . Colon cancer Mother        died in 36s  . Cancer Mother        Colon     Current Outpatient Medications (Cardiovascular):  .  amLODipine (NORVASC) 10 MG tablet, TAKE 1 TABLET EVERY DAY .  fenofibrate 160 MG  tablet, Take 1 tablet (160 mg total) by mouth daily. .  furosemide (LASIX) 20 MG tablet, Take 1 tablet (20 mg total) by mouth daily. .  metoprolol tartrate (LOPRESSOR) 25 MG tablet, TAKE 1 TABLET TWICE DAILY .  rosuvastatin (CRESTOR) 40 MG tablet, Take 1 tablet (40 mg total) by mouth daily.  Current Outpatient Medications (Respiratory):  .  albuterol (VENTOLIN HFA) 108 (90 Base) MCG/ACT inhaler, Inhale 2 puffs into the lungs every 12 (twelve) hours as needed for wheezing or shortness of breath.  Current Outpatient Medications (Analgesics):  .  aspirin EC 81 MG tablet, Take 81 mg by mouth daily. Swallow whole. .  oxyCODONE-acetaminophen (PERCOCET) 7.5-325 MG tablet, Take 1 tablet by mouth every 8 (eight) hours as needed for severe pain.   Current Outpatient Medications (Other):  .  gabapentin (NEURONTIN) 100 MG capsule, Take 1 capsule (100 mg total) by mouth 2 (two) times daily. (Patient taking differently: Take 100 mg by mouth 2 (two) times daily. 200 mg in AM and 400 mg PM) .  gabapentin (NEURONTIN) 400 MG capsule, Take 1 capsule (400 mg total) by mouth in the morning and at bedtime. Marland Kitchen  linaclotide (LINZESS) 145 MCG CAPS capsule, Take 145 mcg by mouth as needed (for stomach).  .  Melatonin 10 MG TABS, Take 10 mg by mouth at bedtime. .  tamsulosin (FLOMAX) 0.4 MG CAPS capsule, Take 1 capsule (0.4 mg total) by mouth daily. Marland Kitchen  tiZANidine (ZANAFLEX) 4 MG tablet, TAKE 1 TABLET EVERY 6 HOURS AS NEEDED FOR MUSCLE SPASM(S) .  valACYclovir (VALTREX) 1000 MG tablet, Take 1 tablet (1,000 mg total) by mouth 3 (three) times daily. For 7 days for shingles.   Reviewed prior external information including notes and imaging from  primary care provider As well as notes that were available from care everywhere and other healthcare systems.  Past medical history, social, surgical and family history all reviewed in electronic medical record.  [name] pertanent information unless stated regarding to the  chief  complaint.   Review of Systems:  [name] headache, visual changes, nausea, vomiting, diarrhea, constipation, dizziness, abdominal pain, skin rash, fevers, chills, night sweats, weight loss, swollen lymph nodes, joint swelling, chest pain, shortness of breath, mood changes. POSITIVE muscle aches, body aches  Objective  Blood pressure (!) 142/86, pulse 63, height 5\' 7"  (1.702 m), weight 161 lb (73 kg), SpO2 96 %.   General: [name] apparent distress alert and oriented x3 mood and affect normal, dressed appropriately.  Masked facies noted. HEENT: Pupils equal, extraocular movements intact  Respiratory: Patient's speak in full sentences and does not appear short of breath  Cardiovascular: 2+ lower extremity edema, non tender, [name] erythema  Gait normal with good balance and coordination.  MSK: Rigidity noted of multiple joints.  Patient does have tenderness to palpation in multiple different areas.  Patient does have the instability of the knees bilaterally but severely tender to palpation on the left knee.  Crepitus noted with range of motion. Right hip does have rigidity also noted.  More versatile tenderness on the lateral aspect of the hip.  Severely tender to palpation over the greater trochanteric area.  Unable to do [facility] secondary to muscle tightness.  After informed verbal consent, patient was seated on exam table.  Left knee was prepped with alcohol swab and utilizing anterolateral approach, patient's left knee space was injected with 4:1  marcaine 0.5%: Kenalog 40mg /dL. Patient tolerated the procedure well without immediate complications.  After verbal consent patient was prepped with alcohol swab and with a 21-gauge 2 inch needle injected into the right greater trochanteric bursa with a total of 1 cc of 0.5% Marcaine and 1 cc of Kenalog 40 mg/mL.  [name] blood loss.  Band-Aid placed  postinjection instructions given.    Impression and Recommendations:     The above documentation has been reviewed and  is accurate and complete Lyndal Pulley, DO

## 2020-09-21 NOTE — Progress Notes (Signed)
Phone [phone] In person visit   Subjective:   Kevin [name] is a 80 y.o. year old very pleasant male patient who presents for/with See problem oriented charting Chief Complaint  Patient presents with  . Chronic Kidney Disease  . PAD  . Leg Swelling    Bilateral legs and feet. Stopped Lasix' about 2-3 months ago, wondering if he should restart     This visit occurred during the SARS-CoV-2 public health emergency.  Safety protocols were in place, including screening questions prior to the visit, additional usage of staff PPE, and extensive cleaning of exam room while observing appropriate contact time as indicated for disinfecting solutions.   Past Medical History-  Patient Active Problem List   Diagnosis Date Noted  . Parkinson's disease ([place]) 11/18/2019    Priority: High  . Afib ([place]) 07/06/2018    Priority: High  . 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 PAD (peripheral artery disease) ([place]) 01/08/2016    Priority: High  . Diastolic CHF ([place]) 26/41/5830    Priority: High  . CKD (chronic kidney disease), stage IV ([place]) 01/27/2014    Priority: High  . Carotid artery stenosis s/p L carotid endarterectomy 01/12/2012    Priority: High  . History of CVA (cerebrovascular accident) 11/17/2011    Priority: High  . Chronic pain syndrome 12/23/2009    Priority: High  . Constipation 09/19/2017    Priority: Medium  . Personal history of colonic polyps 05/15/2013    Priority: Medium  . Essential tremor 10/05/2009    Priority: Medium  . UNSPECIFIED ANEMIA 12/05/2008    Priority: Medium  . BPH (benign prostatic hyperplasia) 06/04/2007    Priority: Medium  . Fasting hyperglycemia 02/05/2007    Priority: Medium  . Hyperlipidemia 01/03/2007    Priority: Medium  . Essential hypertension 01/03/2007    Priority: Medium  . Hemiparesis ([place]) 04/16/2019    Priority: Low  . CAP (community acquired pneumonia) 03/17/2015    Priority: Low  . Family history of malignant neoplasm  of gastrointestinal tract 05/15/2013    Priority: Low  . Inguinal hernia 05/01/2012    Priority: Low  . BURSITIS, HIP 12/21/2009    Priority: Low  . ACTINIC KERATOSIS, EAR, LEFT 03/09/2009    Priority: Low  . 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 Left shoulder pain 02/04/2020  . Loss of weight 10/08/2019  . Greater trochanteric bursitis of both hips 02/26/2019  . Degenerative arthritis of knee, bilateral 01/14/2019  . SIRS (systemic inflammatory response syndrome) ([place]) 01/08/2019  . Chest pain 01/08/2019  . Atelectasis of left lung 10/30/2018  . Lumbar spondylosis 03/17/2017    Medications- reviewed and updated Current Outpatient Medications  Medication Sig Dispense Refill  . albuterol (VENTOLIN HFA) 108 (90 Base) MCG/ACT inhaler Inhale 2 puffs into the lungs every 12 (twelve) hours as needed for wheezing or shortness of breath. 1 each 6  . amLODipine (NORVASC) 10 MG tablet TAKE 1 TABLET EVERY DAY 90 tablet 1  . fenofibrate 160 MG tablet Take 1 tablet (160 mg total) by mouth daily. 90 tablet 3  . gabapentin (NEURONTIN) 100 MG capsule Take 1 capsule (100 mg total) by mouth 2 (two) times daily. (Patient taking differently: Take 100 mg by mouth 2 (two) times daily. 200 mg in AM and 400 mg PM) 60 capsule 0  . linaclotide (LINZESS) 145 MCG CAPS capsule Take  145 mcg by mouth as needed (for stomach).     . Melatonin 10 MG TABS Take 10 mg by mouth at bedtime.    . metoprolol tartrate (LOPRESSOR) 25 MG tablet TAKE 1 TABLET TWICE DAILY 180 tablet 3  . oxyCODONE-acetaminophen (PERCOCET) 7.5-325 MG tablet Take 1 tablet by mouth every 8 (eight) hours as needed for severe pain. 80 tablet 0  . rosuvastatin (CRESTOR) 40 MG tablet Take 1 tablet (40 mg total) by mouth daily. 90 tablet 3  . tamsulosin (FLOMAX) 0.4 MG CAPS capsule Take 1 capsule (0.4 mg total) by mouth daily. 90 capsule 3  . tiZANidine (ZANAFLEX) 4 MG  tablet TAKE 1 TABLET EVERY 6 HOURS AS NEEDED FOR MUSCLE SPASM(S) 90 tablet 1  . valACYclovir (VALTREX) 1000 MG tablet Take 1 tablet (1,000 mg total) by mouth 3 (three) times daily. For 7 days for shingles. 21 tablet 0  . furosemide (LASIX) 20 MG tablet TAKE 1 TABLET EVERY DAY (Patient not taking: Reported on 09/21/2020) 90 tablet 1  . gabapentin (NEURONTIN) 400 MG capsule Take 1 capsule (400 mg total) by mouth in the morning and at bedtime. (Patient not taking: Reported on 09/21/2020) 180 capsule 3   No current facility-administered medications for this visit.     Objective:  BP 136/74   Pulse 61   Temp 98.3 F (36.8 C) (Temporal)   Wt 161 lb 9.6 oz (73.3 kg)   SpO2 96%   BMI 25.31 kg/m  Gen: NAD, resting comfortably CV: RRR no murmurs rubs or gallops Lungs: CTAB no crackles, wheeze, rhonchi Ext: 1+ edema Skin: warm, dry Neuro: Walks with cane    Assessment and Plan   #Social update/chronic pain-patient moved out of state to [place] on Wednesday permanently.  I am really going to miss him- he and his wife are very kind people and a lot of fun to be around.  I think them for the opportunity to be their physician for these years.  I do not want him to run out of medication during the transition including the Percocet-he knows to reach out to me if needed- he has been on Percocet since I met him in 2015 and has been diligent about follow-up with pain management and never shown signs of abuse. -Thankfully his back has been better after recent injections but still suffering from primarily knee and hip issues -In regards to pain he still suffers from pain even on the Percocet-we discussed the issue of increasing medicine but ongoing tolerance building and needing higher doses-recommended no further increase.  We discussed "Could try voltaren gel/diclofenac gel for knees. Does not work for deep joint like the hips. With kidney disease if you wanted to start with just twice a day thatd be  reasonable."  He has taken some intermittent oral NSAIDs-I recommended he avoid this with chronic kidney disease stage IV and GFR right around or just under 30 with creatinine around 2.1-2.4.  #hypertension/diastolic CHF S: medication: Metoprolol 25 mg twice daily, amlodipine 10 mg daily, Lasix 20 mg (has not used in sometime)  BP Readings from Last 3 Encounters:  09/21/20 136/74  08/13/20 131/75  08/11/20 140/70  A/P: Blood pressure well controlled even without Lasix recently.  In regards to CHF-mild poor control he has had increased edema recently-also some fatigue-recommended he restart Lasix and this was sent in today   #CAD  #hyperlipidemia S: Medication: Rosuvastatin 40 mg, aspirin 81 mg-apparently patient never restarted after recent procedure Lab Results  Component Value Date  CHOL 104 05/12/2020   HDL 56 05/12/2020   LDLCALC 34 05/12/2020   LDLDIRECT 134.9 12/26/2012   TRIG 68 05/12/2020   CHOLHDL 1.9 05/12/2020   A/P: CAD appears stable-no worsening chest pain or shortness of breath reported.  Patient is compliant with rosuvastatin-I did recommend he restart his aspirin 81 mg and this was added back to his medication list today   Recommended follow up: Unfortunately I will not be seeing patient anymore as he is transitioning to [place]-we will really miss him Future Appointments  Date Time Provider [place]  09/22/2020  1:15 PM Lyndal Pulley, DO [facility] None  09/22/2020  3:00 PM Bayard Hugger, NP [facility] CPR    Lab/Order associations:   ICD-10-CM   1. Essential hypertension  I10   2. Hyperlipidemia, unspecified hyperlipidemia type  E78.5       Return precautions advised.  Garret Reddish, MD

## 2020-09-22 ENCOUNTER — Other Ambulatory Visit: Payer: Self-pay

## 2020-09-22 ENCOUNTER — Ambulatory Visit: Payer: Medicare HMO | Admitting: Family Medicine

## 2020-09-22 ENCOUNTER — Encounter: Payer: Self-pay | Admitting: Family Medicine

## 2020-09-22 ENCOUNTER — Encounter: Payer: Medicare HMO | Attending: Physical Medicine & Rehabilitation | Admitting: Registered Nurse

## 2020-09-22 ENCOUNTER — Encounter: Payer: Self-pay | Admitting: Registered Nurse

## 2020-09-22 VITALS — BP 131/70 | HR 61 | Temp 98.9°F | Ht 67.0 in | Wt 161.6 lb

## 2020-09-22 DIAGNOSIS — M1712 Unilateral primary osteoarthritis, left knee: Secondary | ICD-10-CM | POA: Diagnosis not present

## 2020-09-22 DIAGNOSIS — G20 Parkinson's disease: Secondary | ICD-10-CM | POA: Diagnosis not present

## 2020-09-22 DIAGNOSIS — Z79891 Long term (current) use of opiate analgesic: Secondary | ICD-10-CM | POA: Insufficient documentation

## 2020-09-22 DIAGNOSIS — M48061 Spinal stenosis, lumbar region without neurogenic claudication: Secondary | ICD-10-CM | POA: Diagnosis not present

## 2020-09-22 DIAGNOSIS — M170 Bilateral primary osteoarthritis of knee: Secondary | ICD-10-CM

## 2020-09-22 DIAGNOSIS — M79605 Pain in left leg: Secondary | ICD-10-CM | POA: Diagnosis not present

## 2020-09-22 DIAGNOSIS — M47816 Spondylosis without myelopathy or radiculopathy, lumbar region: Secondary | ICD-10-CM

## 2020-09-22 DIAGNOSIS — M1711 Unilateral primary osteoarthritis, right knee: Secondary | ICD-10-CM | POA: Diagnosis not present

## 2020-09-22 DIAGNOSIS — M7062 Trochanteric bursitis, left hip: Secondary | ICD-10-CM | POA: Insufficient documentation

## 2020-09-22 DIAGNOSIS — M7061 Trochanteric bursitis, right hip: Secondary | ICD-10-CM | POA: Insufficient documentation

## 2020-09-22 DIAGNOSIS — G894 Chronic pain syndrome: Secondary | ICD-10-CM

## 2020-09-22 DIAGNOSIS — Z5181 Encounter for therapeutic drug level monitoring: Secondary | ICD-10-CM | POA: Insufficient documentation

## 2020-09-22 NOTE — Assessment & Plan Note (Signed)
Has been worsening over the course of time.  Patient does have more resting tremor as well as some increase in rigidity noted.

## 2020-09-22 NOTE — Assessment & Plan Note (Signed)
Chronic problem.  Worsening symptoms.  Discussed with patient about wearing the brace on a more regular basis, and topical anti-inflammatories.  Discussed continuing all his other medications prescribed by the primary care.  Patient is moving.  Encouraged him to establish as soon as possible with another sports medicine physician and we can send all notes if necessary.  Follow-up with me again on a as needed

## 2020-09-22 NOTE — Patient Instructions (Addendum)
Injections today  CoQ10 200mg  w cholesterol meds If continuing with cramps can take 200-400mg  of magneisum at night only as needed Good luck in [place]!

## 2020-09-22 NOTE — Assessment & Plan Note (Signed)
Repeat injection given again today.  Tolerated the procedure well, discussed icing regimen and home exercises.  Discussed which activities Comes avoid.  Likely some of this is secondary to more of a lumbar radiculopathy.  Patient will be setting up with a new sports medicine doctor in the near future.  Follow-up with me only as needed

## 2020-09-22 NOTE — Progress Notes (Signed)
Subjective:    Patient ID: Kevin [name], male    DOB: [birth year], 80 y.o.   MRN: [id number]  HPI: Kevin [name] is a 80 y.o. male who returns for follow up appointment for chronic pain and medication refill. He states his pain is located in his lower back, bilateral hips and bilateral knee pain. He rates his pain 9. His current exercise regime is walking and performing stretching exercises.  Kevin [name] Morphine equivalent is 33.33 MME.  Last UDS was Performed on 08/13/2020, it was consistent.   Today is Kevin [name] last office visit, he is moving to [facility].    Pain Inventory Average Pain 8 Pain Right Now 9 My pain is intermittent, constant, sharp, burning, dull, stabbing, tingling and aching  In the last 24 hours, has pain interfered with the following? General activity 9 Relation with others 9 Enjoyment of life 9 What TIME of day is your pain at its worst? morning , daytime, evening and night Sleep (in general) Fair  Pain is worse with: walking and standing Pain improves with: heat/ice, medication and injections Relief from Meds: little  Family History  Problem Relation Age of Onset  . Parkinsonism Father   . Dementia Father   . Cancer Father        Throat  . Colon cancer Mother        died in 74s  . Cancer Mother        Colon   Social History   Socioeconomic History  . Marital status: Married    Spouse name: Not on file  . Number of children: Not on file  . Years of education: Not on file  . Highest education level: Not on file  Occupational History  . Occupation: retired    Comment: maintenance; electrician  Tobacco Use  . Smoking status: Former Smoker    Packs/day: 2.00    Years: 28.00    Pack years: 56.00    Types: Cigarettes    Quit date: 05/31/1987    Years since quitting: 33.3  . Smokeless tobacco: Never Used  . Tobacco comment: quit smoking "in my 40's"  Vaping Use  . Vaping Use: Never used  Substance and Sexual Activity  . Alcohol use: Not  Currently    Alcohol/week: 0.0 standard drinks    Comment: rare, mikes hard lemonade  . Drug use: Not Currently    Types: Marijuana    Comment: hx of-4-5 yrs ago  . Sexual activity: Not Currently  Other Topics Concern  . Not on file  Social History Narrative   Married 33 years in 2015. Lived together for 12 years. 2 boys from first wife. 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       Hobbies: yardwork, [place] barn, woodwork, collect knive   Right handed   One story home   Caffeine yes   Social Determinants of Health   Financial Resource Strain: Low Risk   . Difficulty of Paying Living Expenses: Not hard at all  Food Insecurity: No Food Insecurity  . Worried About Charity fundraiser in the Last Year: Never true  . Ran Out of Food in the Last Year: Never true  Transportation Needs: No Transportation Needs  . Lack of Transportation (Medical): No  . Lack of Transportation (Non-Medical): No  Physical Activity: Inactive  .  Days of Exercise per Week: 0 days  . Minutes of Exercise per Session: 0 min  Stress: No Stress Concern Present  . Feeling of Stress : Not at all  Social Connections: Moderately Isolated  . Frequency of Communication with Friends and Family: More than three times a week  . Frequency of Social Gatherings with Friends and Family: Once a week  . Attends Religious Services: Never  . Active Member of Clubs or Organizations: No  . Attends Archivist Meetings: Never  . Marital Status: Married   Past Surgical History:  Procedure Laterality Date  . ABDOMINAL AORTIC ANEURYSM REPAIR  2010  . CAROTID ENDARTERECTOMY Left 09/20/2004  . CATARACT EXTRACTION, BILATERAL     2016-2017  . COLONOSCOPY    . 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 ENDARTERECTOMY Right 10/12/2015   Procedure: RIGHT CAROTID ENDARTERECTOMY WITH PATCH ANGIOPLASTY;  Surgeon: Elam Dutch, MD;  Location: [place];  Service: Vascular;  Laterality: Right;  . INGUINAL HERNIA REPAIR Left 02/05/2013   Procedure: HERNIA REPAIR INGUINAL ADULT;  Surgeon: Joyice Faster. Cornett, MD;  Location: [place];  Service: General;  Laterality: Left;  . INGUINAL HERNIA REPAIR Left   . INSERTION OF MESH Left 02/05/2013   Procedure: INSERTION OF MESH;  Surgeon: Joyice Faster. Cornett, MD;  Location: [place];  Service: General;  Laterality: Left;  . LEFT HEART CATH AND CORONARY ANGIOGRAPHY N/A 06/21/2018   Procedure: LEFT HEART CATH AND CORONARY ANGIOGRAPHY;  Surgeon: Jettie Booze, MD;  Location: [place] [facility];  Service: Cardiovascular;  Laterality: N/A;  . SHOULDER ARTHROSCOPY WITH ROTATOR CUFF REPAIR AND SUBACROMIAL DECOMPRESSION Left 05/01/2014   Procedure: LEFT ARTHROSCOPY SHOULDER SUBACROMIAL DECOMPRESSION,DISTAL CLAVICAL RESECTION AND ROTATOR CUFF REPAIR;  Surgeon: Marin Shutter, MD;  Location: [place];  Service: Orthopedics;  Laterality: Left;  . TEE WITHOUT CARDIOVERSION N/A 06/27/2018   Procedure: TRANSESOPHAGEAL ECHOCARDIOGRAM (TEE);  Surgeon: Melrose Nakayama, MD;  Location: [place];  Service: Open Heart Surgery;  Laterality: N/A;  . TESTICLAR CYST EXCISION Left   . TONSILLECTOMY    . VASECTOMY     Past Surgical History:  Procedure Laterality Date  . ABDOMINAL AORTIC ANEURYSM REPAIR  2010  . CAROTID ENDARTERECTOMY Left 09/20/2004  . CATARACT EXTRACTION, BILATERAL     2016-2017  . COLONOSCOPY    . 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 ENDARTERECTOMY Right 10/12/2015   Procedure: RIGHT CAROTID ENDARTERECTOMY WITH PATCH ANGIOPLASTY;  Surgeon: Elam Dutch, MD;  Location: [place];  Service: Vascular;  Laterality: Right;  . INGUINAL HERNIA REPAIR Left 02/05/2013   Procedure: HERNIA REPAIR INGUINAL ADULT;  Surgeon: Joyice Faster. Cornett, MD;  Location: [place];  Service: General;  Laterality: Left;  . INGUINAL HERNIA REPAIR Left   . INSERTION OF MESH Left 02/05/2013   Procedure: INSERTION OF MESH;  Surgeon: Joyice Faster. Cornett, MD;  Location: [place];  Service: General;  Laterality: Left;  .  LEFT HEART CATH AND CORONARY ANGIOGRAPHY N/A 06/21/2018   Procedure: LEFT HEART CATH AND CORONARY ANGIOGRAPHY;  Surgeon: Jettie Booze, MD;  Location: [place] [facility];  Service: Cardiovascular;  Laterality: N/A;  . SHOULDER ARTHROSCOPY WITH ROTATOR CUFF REPAIR AND SUBACROMIAL DECOMPRESSION Left 05/01/2014   Procedure: LEFT ARTHROSCOPY SHOULDER SUBACROMIAL DECOMPRESSION,DISTAL CLAVICAL RESECTION AND ROTATOR CUFF REPAIR;  Surgeon: Marin Shutter, MD;  Location: [place];  Service: Orthopedics;  Laterality: Left;  . TEE WITHOUT CARDIOVERSION N/A 06/27/2018   Procedure: TRANSESOPHAGEAL ECHOCARDIOGRAM (TEE);  Surgeon: Melrose Nakayama, MD;  Location: [place];  Service: Open Heart Surgery;  Laterality: N/A;  . TESTICLAR CYST EXCISION Left   . TONSILLECTOMY    . VASECTOMY     Past Medical History:  Diagnosis Date  . Arthritis    DDD- Lower Back  . Bradycardia   . CAP (community acquired pneumonia) 03/17/2015   "THAT'S WHAT THEY ARE THINKING; THEY ARE NOT SURE"  . Carotid artery occlusion   . Chest pain 06/19/2018  . Chronic lower back pain    DDD  . CKD (chronic kidney disease)    Dr. Corliss Parish  . 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 Diverticulosis   . Enlarged prostate    takes Flomax daily  . History of blood transfusion    "probably when they did aortic aneurysm"  . History of colon polyps    benign  . Hyperlipidemia   . Hypertension    takes Amlodipine and Zebeta daily  . Insomnia    takes Melatonin nightly  . Joint pain   . 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     . Muscle spasm    takes Zanaflex daily as needed  . Peripheral edema    takes Furosemide daily  . Pneumonia    hx of   . Rheumatic fever 1946  . Rheumatoid arthritis ([place])   . Short-term memory loss    minimal  . Shortness of breath dyspnea    with exertion but lasts very short period of time  . TIA (transient ischemic attack)    x 2;takes Plavix daily  . Urinary frequency    takes Flomax daily  . Urinary urgency    BP 131/70   Pulse 61   Temp 98.9 F (37.2 C)   Ht 5\' 7"  (1.702 m)   Wt 161 lb 9.6 oz (73.3 kg)   SpO2 96%   BMI 25.31 kg/m   Opioid Risk Score:   Fall Risk Score:  `1  Depression screen PHQ 2/9  Depression screen [facility] 2/9 08/13/2020 06/11/2020 06/05/2020 05/11/2020 04/09/2020 03/11/2020 01/09/2020  Decreased Interest 0 0 0 0 1 0 0  Down, Depressed, Hopeless 0 0 0 0 1 0 0  PHQ - 2 Score 0 0 0 0 2 0 0  Altered sleeping - - - - - - -  Tired, decreased energy - - - - - - -  Change in appetite - - - - - - -  Feeling bad or failure about yourself  - - - - - - -  Trouble concentrating - - - - - - -  Moving slowly or fidgety/restless - - - - - - -  PHQ-9 Score - - - - - - -  Some  recent data might be hidden   Review of Systems  Musculoskeletal: Positive for back pain and gait problem.       Pain in hips & knees  All other systems reviewed and are negative.      Objective:   Physical Exam Vitals and nursing note reviewed.  Constitutional:       Appearance: Normal appearance.  Cardiovascular:     Rate and Rhythm: Normal rate and regular rhythm.     Pulses: Normal pulses.     Heart sounds: Normal heart sounds.  Pulmonary:     Effort: Pulmonary effort is normal.     Breath sounds: Normal breath sounds.  Musculoskeletal:     Cervical back: Normal range of motion and neck supple.     Comments: Normal Muscle Bulk and Muscle Testing Reveals:  Upper Extremities: Full ROM and Muscle Strength 5/5  Lumbar Paraspinal Tenderness: L-4-L-5 Lower Extremities: Full ROM and Muscle Strength 5/5 Arises from Table Slowly using cane for support Narrow Based Gait   Skin:    General: Skin is warm and dry.  Neurological:     Mental Status: He is alert and oriented to person, place, and time.  Psychiatric:        Mood and Affect: Mood normal.        Behavior: Behavior normal.           Assessment & Plan:  1. Lumbar Spondylosis/ Lumbar Stenosis:09/22/2020 Continue current medication regime. Refilled:Oxycodone 7.5/325 mg one tabletevery 8 hoursas needed #80.09/22/2020.We will continue the opioid monitoring program, this consists of regular clinic visits, examinations, urine drug screen, pill counts as well as use of [place] Controlled Substance Reporting system. A 12 month History has been reviewed on the [place] Controlled Substance Reporting Systemon04/26/2022. 2.Bilateral Knees OA/ChronicBilateralKnee Pain:S/PBilateral KneeInjection by DrSmithon 12/09/2019, Orthopedist Following04/26/2022. 3.Bilateral Greater Trochanter Bursitis:Orthopedist Following.Continue to alternate Ice and Heat Therapy.04/262022 4. Chronic Left Leg Pain:No complaints today.Continue Current Medication Regimen. Continue to Monitor.09/22/2020     F/U in 1 month

## 2020-09-23 ENCOUNTER — Encounter: Payer: Self-pay | Admitting: Registered Nurse

## 2020-09-29 ENCOUNTER — Telehealth: Payer: Self-pay | Admitting: Family Medicine

## 2020-09-29 NOTE — Chronic Care Management (AMB) (Signed)
  Chronic Care Management   Outreach Note  09/29/2020 Name: Kevin [name] MRN: [id number] DOB: Jul 02, 1940  Referred by: Marin Olp, MD Reason for referral : No chief complaint on file.   A second unsuccessful telephone outreach was attempted today. The patient was referred to pharmacist for assistance with care management and care coordination.  Follow Up Plan:   Lauretta Grill Upstream Scheduler

## 2020-10-07 ENCOUNTER — Telehealth: Payer: Self-pay

## 2020-10-07 ENCOUNTER — Other Ambulatory Visit: Payer: Self-pay

## 2020-10-07 MED ORDER — TIZANIDINE HCL 4 MG PO TABS
ORAL_TABLET | ORAL | 1 refills | Status: DC
Start: 2020-10-07 — End: 2020-12-24

## 2020-10-07 MED ORDER — AMLODIPINE BESYLATE 10 MG PO TABS: 1.0000 | ORAL_TABLET | Freq: Every day | ORAL | 3 refills | Status: AC

## 2020-10-07 MED ORDER — TAMSULOSIN HCL 0.4 MG PO CAPS: 0.4000 mg | ORAL_CAPSULE | Freq: Every day | ORAL | 3 refills | Status: AC

## 2020-10-07 MED ORDER — FENOFIBRATE 160 MG PO TABS: 160.0000 mg | ORAL_TABLET | Freq: Every day | ORAL | 3 refills | Status: AC

## 2020-10-07 MED ORDER — METOPROLOL TARTRATE 25 MG PO TABS: ORAL_TABLET | ORAL | 3 refills | Status: AC

## 2020-10-07 MED ORDER — FUROSEMIDE 20 MG PO TABS: 20.0000 mg | ORAL_TABLET | Freq: Every day | ORAL | 3 refills | Status: AC

## 2020-10-07 MED ORDER — GABAPENTIN 100 MG PO CAPS: 100.0000 mg | ORAL_CAPSULE | Freq: Two times a day (BID) | ORAL | 0 refills | Status: AC

## 2020-10-07 MED ORDER — ROSUVASTATIN CALCIUM 40 MG PO TABS: 40.0000 mg | ORAL_TABLET | Freq: Every day | ORAL | 3 refills | Status: AC

## 2020-10-07 MED ORDER — GABAPENTIN 400 MG PO CAPS: 400.0000 mg | ORAL_CAPSULE | Freq: Two times a day (BID) | ORAL | 3 refills | Status: AC

## 2020-10-07 MED ORDER — VALACYCLOVIR HCL 1 G PO TABS: 1000.0000 mg | ORAL_TABLET | Freq: Three times a day (TID) | ORAL | 0 refills | Status: DC

## 2020-10-07 MED ORDER — ASPIRIN EC 81 MG PO TBEC: 81.0000 mg | DELAYED_RELEASE_TABLET | Freq: Every day | ORAL | 3 refills | Status: AC

## 2020-10-07 NOTE — Telephone Encounter (Signed)
.   LAST APPOINTMENT DATE: 09/29/2020   NEXT APPOINTMENT DATE:@Visit  date not found  MEDICATION:amLODipine (NORVASC) 10 MG tablet  aspirin EC 81 MG tablet  fenofibrate 160 MG tablet - Completley out   furosemide (LASIX) 20 MG tablet  gabapentin (NEURONTIN) 100 MG capsule  gabapentin (NEURONTIN) 400 MG capsule  linaclotide (LINZESS) 145 MCG CAPS capsule  metoprolol tartrate (LOPRESSOR) 25 MG tablet  rosuvastatin (CRESTOR) 40 MG tablet  tamsulosin (FLOMAX) 0.4 MG CAPS capsule  tiZANidine (ZANAFLEX) 4 MG tablet  valACYclovir (VALTREX) 1000 MG tablet    [place], [place]

## 2020-10-07 NOTE — Telephone Encounter (Signed)
Refills sent

## 2020-10-09 ENCOUNTER — Telehealth: Payer: Self-pay

## 2020-10-09 NOTE — Telephone Encounter (Signed)
Spoke with patient he is now aware that we did receive his Prescriptions and they have been sent to his pharmacy. He gave a verbal understanding.

## 2020-10-09 NOTE — Telephone Encounter (Signed)
Pt called stating [facility] will be faxing over paperwork for his prescriptions. Pt states the pharmacy is trying to fix the prescriptions to where he will receive them all at once. Pt wanted to make Dr. [name] aware.

## 2020-10-28 ENCOUNTER — Other Ambulatory Visit: Payer: Self-pay | Admitting: Family Medicine

## 2020-10-29 NOTE — Telephone Encounter (Signed)
Error

## 2020-11-03 ENCOUNTER — Telehealth: Payer: Self-pay | Admitting: Registered Nurse

## 2020-11-03 NOTE — Telephone Encounter (Signed)
Please schedule My-Chart Visit, he moved to [place].

## 2020-11-03 NOTE — Telephone Encounter (Signed)
Kevin [name] called and he states he has two days worth of meds, can't find doctor to see until Oct. Please advise if we need to schedule for virtual visit

## 2020-11-05 ENCOUNTER — Encounter: Payer: Self-pay | Admitting: Registered Nurse

## 2020-11-05 ENCOUNTER — Other Ambulatory Visit: Payer: Self-pay

## 2020-11-05 ENCOUNTER — Encounter: Payer: Medicare HMO | Attending: Physical Medicine & Rehabilitation | Admitting: Registered Nurse

## 2020-11-05 VITALS — Temp 97.5°F | Ht 67.0 in | Wt 157.2 lb

## 2020-11-05 DIAGNOSIS — M1711 Unilateral primary osteoarthritis, right knee: Secondary | ICD-10-CM | POA: Diagnosis not present

## 2020-11-05 DIAGNOSIS — M170 Bilateral primary osteoarthritis of knee: Secondary | ICD-10-CM | POA: Diagnosis not present

## 2020-11-05 DIAGNOSIS — M7061 Trochanteric bursitis, right hip: Secondary | ICD-10-CM | POA: Insufficient documentation

## 2020-11-05 DIAGNOSIS — G894 Chronic pain syndrome: Secondary | ICD-10-CM | POA: Insufficient documentation

## 2020-11-05 DIAGNOSIS — M47816 Spondylosis without myelopathy or radiculopathy, lumbar region: Secondary | ICD-10-CM | POA: Insufficient documentation

## 2020-11-05 DIAGNOSIS — Z79891 Long term (current) use of opiate analgesic: Secondary | ICD-10-CM | POA: Insufficient documentation

## 2020-11-05 DIAGNOSIS — Z5181 Encounter for therapeutic drug level monitoring: Secondary | ICD-10-CM | POA: Insufficient documentation

## 2020-11-05 DIAGNOSIS — M1712 Unilateral primary osteoarthritis, left knee: Secondary | ICD-10-CM | POA: Diagnosis not present

## 2020-11-05 MED ORDER — OXYCODONE-ACETAMINOPHEN 7.5-325 MG PO TABS: 1.0000 | ORAL_TABLET | Freq: Three times a day (TID) | ORAL | 0 refills | Status: DC | PRN

## 2020-11-05 NOTE — Progress Notes (Addendum)
Subjective:    Patient ID: Kevin [name], male    DOB: [birth year], 80 y.o.   MRN: [id number]  HPI: Kevin [name] is a 80 y.o. male whose appointment was changed to a My-Chart Video Visit, Kevin [name] moved to [facility] he's in the process of obtaining a PCP.  Dr Letta Pate made aware of Mr [name] difficulty obtaining a PCP, agrees with My Chart visit.  Kevin [name] agrees with My-Chart Video Visit and verbalizes understanding. He states his pain is located in his right hip and right lower extremity pain, he describes as a achy pain. Also reports bilateral knee pain. He rates his pain 7. His current exercise regime is walking and performing stretching exercises.  Kevin [name] Morphine equivalent is 33.33 MME.  Last UDS was Performed on 08/13/2020, it was consistent.   Jorja Loa RMA asked the Health and History Questions. This provider and [place] verified we were speaking with the correct person using two identifiers.      Pain Inventory Average Pain 8 Pain Right Now 7 My pain is intermittent, burning, dull, aching, and numbness  In the last 24 hours, has pain interfered with the following? General activity 9 Relation with others 8 Enjoyment of life 0 What TIME of day is your pain at its worst? daytime, evening, and night Sleep (in general) Fair  Pain is worse with: walking, bending, sitting, standing, and some activites Pain improves with: rest, medication, and heat Relief from Meds: 5  Family History  Problem Relation Age of Onset   Parkinsonism Father    Dementia Father    Cancer Father        Throat   Colon cancer Mother        died in 30s   Cancer Mother        Colon   Social History   Socioeconomic History   Marital status: Married    Spouse name: Not on file   Number of children: Not on file   Years of education: Not on file   Highest education level: Not on file  Occupational History   Occupation: retired    Comment: maintenance; electrician  Tobacco Use   Smoking  status: Former    Packs/day: 2.00    Years: 28.00    Pack years: 56.00    Types: Cigarettes    Quit date: 05/31/1987    Years since quitting: 33.4   Smokeless tobacco: Never   Tobacco comments:    quit smoking "in my 74's"  Vaping Use   Vaping Use: Never used  Substance and Sexual Activity   Alcohol use: Not Currently    Alcohol/week: 0.0 standard drinks    Comment: rare, mikes hard lemonade   Drug use: Not Currently    Types: Marijuana    Comment: hx of-4-5 yrs ago   Sexual activity: Not Currently  Other Topics Concern   Not on file  Social History Narrative   Married 33 years in 2015. Lived together for 12 years. 2 boys from first wife. 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       Hobbies: yardwork, [place] barn, woodwork, collect knive   Right handed   One story home   Caffeine yes   Social Determinants of Health   Financial Resource Strain: Low Risk    Difficulty of Paying Living  Expenses: Not hard at all  Food Insecurity: No Food Insecurity   Worried About Charity fundraiser in the Last Year: Never true   Ran Out of Food in the Last Year: Never true  Transportation Needs: No Transportation Needs   Lack of Transportation (Medical): No   Lack of Transportation (Non-Medical): No  Physical Activity: Inactive   Days of Exercise per Week: 0 days   Minutes of Exercise per Session: 0 min  Stress: No Stress Concern Present   Feeling of Stress : Not at all  Social Connections: Moderately Isolated   Frequency of Communication with Friends and Family: More than three times a week   Frequency of Social Gatherings with Friends and Family: Once a week   Attends Religious Services: Never   Marine scientist or Organizations: No   Attends Music therapist: Never   Marital Status: Married   Past Surgical History:  Procedure Laterality Date    ABDOMINAL AORTIC ANEURYSM REPAIR  2010   CAROTID ENDARTERECTOMY Left 09/20/2004   CATARACT EXTRACTION, BILATERAL     2016-2017   COLONOSCOPY     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 Cornett, MD;  Location: [place];  Service: General;  Laterality: Left;   INGUINAL HERNIA REPAIR Left    INSERTION OF MESH Left 02/05/2013   Procedure: INSERTION OF MESH;  Surgeon: Joyice Faster. Cornett, MD;  Location[place];  Service: General;  Laterality: Left;   LEFT HEART CATH AND CORONARY ANGIOGRAPHY N/A 06/21/2018   Procedure: LEFT HEART CATH AND CORONARY ANGIOGRAPHY;  Surgeon: Jettie Booze, MD;  Location: [place] [facility];  Service: Cardiovascular;  Laterality: N/A;   SHOULDER ARTHROSCOPY WITH ROTATOR CUFF REPAIR AND SUBACROMIAL DECOMPRESSION Left 05/01/2014   Procedure: LEFT ARTHROSCOPY SHOULDER SUBACROMIAL DECOMPRESSION,DISTAL CLAVICAL RESECTION AND ROTATOR CUFF REPAIR;  Surgeon: Marin Shutter, MD;  Location: [place];  Service: Orthopedics;  Laterality: Left;   TEE WITHOUT CARDIOVERSION N/A 06/27/2018   Procedure: TRANSESOPHAGEAL ECHOCARDIOGRAM (TEE);  Surgeon: Melrose Nakayama, MD;  Location: [place];  Service: Open Heart Surgery;  Laterality: N/A;   TESTICLAR CYST EXCISION Left    TONSILLECTOMY     VASECTOMY     Past Surgical History:  Procedure Laterality Date   ABDOMINAL AORTIC ANEURYSM REPAIR  2010    CAROTID ENDARTERECTOMY Left 09/20/2004   CATARACT EXTRACTION, BILATERAL     2016-2017   COLONOSCOPY     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 Cornett, MD;  Location: [place];  Service: General;  Laterality: Left;   INGUINAL HERNIA REPAIR Left    INSERTION OF MESH Left 02/05/2013   Procedure: INSERTION OF MESH;  Surgeon: Joyice Faster. Cornett, MD;  Location: [place];  Service: General;  Laterality: Left;   LEFT HEART CATH AND CORONARY ANGIOGRAPHY N/A 06/21/2018   Procedure: LEFT HEART CATH AND CORONARY ANGIOGRAPHY;  Surgeon: Jettie Booze, MD;  Location: [place] [facility];  Service: Cardiovascular;  Laterality: N/A;   SHOULDER ARTHROSCOPY WITH ROTATOR CUFF REPAIR AND SUBACROMIAL DECOMPRESSION Left 05/01/2014   Procedure: LEFT ARTHROSCOPY SHOULDER SUBACROMIAL DECOMPRESSION,DISTAL CLAVICAL RESECTION AND ROTATOR CUFF REPAIR;  Surgeon: Marin Shutter, MD;  Location: [place];  Service: Orthopedics;  Laterality: Left;   TEE WITHOUT CARDIOVERSION N/A 06/27/2018   Procedure: TRANSESOPHAGEAL ECHOCARDIOGRAM (TEE);  Surgeon: Melrose Nakayama, MD;  Location: [place];  Service: Open Heart Surgery;  Laterality: N/A;   TESTICLAR CYST EXCISION Left    TONSILLECTOMY     VASECTOMY     Past Medical History:  Diagnosis Date   Arthritis    DDD- Lower Back   Bradycardia    CAP (community acquired pneumonia) 03/17/2015    "THAT'S WHAT THEY ARE THINKING; THEY ARE NOT SURE"   Carotid artery occlusion    Chest pain 06/19/2018   Chronic lower back pain    DDD   CKD (chronic kidney disease)    Dr. 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      Muscle spasm    takes Zanaflex daily as needed   Peripheral edema    takes Furosemide daily   Pneumonia    hx of    Rheumatic fever 1946   Rheumatoid arthritis ([place])    Short-term memory loss    minimal   Shortness of breath dyspnea    with exertion but lasts very short period of time   TIA (transient ischemic attack)    x 2;takes Plavix daily   Urinary frequency    takes Flomax daily   Urinary urgency    Temp (!) 97.5 F (36.4 C)   Ht 5\' 7"  (1.702 m)   Wt 157 lb 3.2 oz (71.3 kg)   SpO2 98%   BMI 24.62 kg/m   Opioid Risk Score:   Fall Risk Score:  `1  Depression screen PHQ 2/9  Depression screen [facility] 2/9 09/22/2020 08/13/2020 06/11/2020 06/05/2020 05/11/2020 04/09/2020 03/11/2020  Decreased Interest 0 0 0 0 0 1 0  Down, Depressed, Hopeless 0 0 0 0 0 1 0  PHQ - 2 Score 0 0 0 0 0 2 0  Altered sleeping - - - - - - -  Tired, decreased energy - - - - - - -  Change in appetite - - - - - - -  Feeling bad or failure about yourself  - - - - - - -  Trouble concentrating - - - - - - -  Moving slowly or fidgety/restless - - - - - - -  PHQ-9 Score - - - - - - -  Some recent data might be hidden    Review of Systems   Musculoskeletal:  Positive for back pain and gait problem.       Pain in Both knees Pain in right hip   Skin:        Swelling in both feet   All other systems reviewed and are negative.     Objective:   Physical Exam Vitals and nursing note reviewed.  Musculoskeletal:     Comments: No Physical Exam Performed: My-Chart Video Visit         Assessment & Plan:  1. Lumbar Spondylosis/ Lumbar Stenosis: 11/05/2020 Continue current medication regime. Refilled: Oxycodone 7.5/325 mg one tablet every 8 hours as needed #80. 09/22/2020.We will continue the opioid monitoring program, this consists of regular clinic visits, examinations, urine drug screen, pill counts as well as use of [place] Controlled Substance Reporting system. A 12 month History has been reviewed on the [place] on 11/05/2020. 2. Bilateral Knees OA/Chronic Bilateral Knee Pain: S/P Bilateral Knee  Injection by Dr Tamala Julian  on 12/09/2019, Orthopedist Following 11/05/2020. 3. Bilateral  Greater Trochanter Bursitis: Orthopedist Following. Continue to alternate Ice and Heat Therapy. 06/092022   4. Chronic Right Leg Pain: Continue Current Medication Regimen. Continue to Monitor. 06/09/ 2022   My-Chart Video Visit Establish Patient Location of Patient: In his Home Location of Provider: In the Office        F/U in 1 month

## 2020-12-07 ENCOUNTER — Encounter: Payer: Self-pay | Admitting: Registered Nurse

## 2020-12-07 ENCOUNTER — Other Ambulatory Visit: Payer: Self-pay

## 2020-12-07 ENCOUNTER — Encounter: Payer: Medicare HMO | Attending: Physical Medicine & Rehabilitation | Admitting: Registered Nurse

## 2020-12-07 VITALS — BP 133/72 | HR 69 | Temp 97.5°F | Ht 67.0 in | Wt 161.7 lb

## 2020-12-07 DIAGNOSIS — Z5181 Encounter for therapeutic drug level monitoring: Secondary | ICD-10-CM | POA: Insufficient documentation

## 2020-12-07 DIAGNOSIS — M7062 Trochanteric bursitis, left hip: Secondary | ICD-10-CM | POA: Diagnosis not present

## 2020-12-07 DIAGNOSIS — M47816 Spondylosis without myelopathy or radiculopathy, lumbar region: Secondary | ICD-10-CM | POA: Insufficient documentation

## 2020-12-07 DIAGNOSIS — M1712 Unilateral primary osteoarthritis, left knee: Secondary | ICD-10-CM | POA: Diagnosis not present

## 2020-12-07 DIAGNOSIS — G894 Chronic pain syndrome: Secondary | ICD-10-CM | POA: Insufficient documentation

## 2020-12-07 DIAGNOSIS — Z79891 Long term (current) use of opiate analgesic: Secondary | ICD-10-CM | POA: Insufficient documentation

## 2020-12-07 MED ORDER — OXYCODONE-ACETAMINOPHEN 7.5-325 MG PO TABS: 1.0000 | ORAL_TABLET | Freq: Three times a day (TID) | ORAL | 0 refills | Status: AC | PRN

## 2020-12-07 NOTE — Progress Notes (Addendum)
Subjective:    Patient ID: Kevin [name], male    DOB: 10/14/1940, 80 y.o.   MRN: [id number]  HPI: Kevin [name] is a 80 y.o. male who is having a My-chart video visit for his chronic pain and medication refill. Kevin [name] agrees with My-chart video visit and verbalizes understanding. Kevin [name] moved to [facility], he has seen a PCP in [place]. Kevin [name], she has placed a referral to pain management he reports. He was instructed to call his PCP to let them know he hasn't heard from Pain Management, he is aware we won't be able to continue to prescribe his oxycodone. He verbalizes understanding.   He states his pain is located in his lower back, left hip and left knee. He rates his pain 7. His current exercise regime is walking and performing stretching exercises.  Kevin [name] Morphine equivalent is 27.78  MME.   Last UDS was Performed on 08/13/2020, it was consistent.   Kevin [name] CMA asked The Health and History Questions. This provider and [place] verfied we were speaking with the correct person using two identifiers.    Pain Inventory Average Pain 8 Pain Right Now 7 My pain is constant, sharp, and aching  In the last 24 hours, has pain interfered with the following? General activity 8 Relation with others 8 Enjoyment of life 8 What TIME of day is your pain at its worst? morning , daytime, and night Sleep (in general) Fair  Pain is worse with: bending, sitting, and some activites Pain improves with: medication Relief from Meds: 4  Family History  Problem Relation Age of Onset   Parkinsonism Father    Dementia Father    Cancer Father        Throat   Colon cancer Mother        died in 49s   Cancer Mother        Colon   Social History   Socioeconomic History   Marital status: Married    Spouse name: Not on file   Number of children: Not on file   Years of education: Not on file   Highest education level: Not on file  Occupational History   Occupation:  retired    Comment: maintenance; electrician  Tobacco Use   Smoking status: Former    Packs/day: 2.00    Years: 28.00    Pack years: 56.00    Types: Cigarettes    Quit date: 05/31/1987    Years since quitting: 33.5   Smokeless tobacco: Never   Tobacco comments:    quit smoking "in my 42's"  Vaping Use   Vaping Use: Never used  Substance and Sexual Activity   Alcohol use: Not Currently    Alcohol/week: 0.0 standard drinks    Comment: rare, mikes hard lemonade   Drug use: Not Currently    Types: Marijuana    Comment: hx of-4-5 yrs ago   Sexual activity: Not Currently  Other Topics Concern   Not on file  Social History Narrative   Married 33 years in 2015. Lived together for 12 years. 2 boys from first wife. 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       Hobbies: yardwork, [place] barn, woodwork, collect knive   Right handed   One story home   Caffeine yes   Social Determinants  of Health   Financial Resource Strain: Low Risk    Difficulty of Paying Living Expenses: Not hard at all  Food Insecurity: No Food Insecurity   Worried About [place] in the Last Year: Never true   [place] in the Last Year: Never true  Transportation Needs: No Transportation Needs   Lack of Transportation (Medical): No   Lack of Transportation (Non-Medical): No  Physical Activity: Inactive   Days of Exercise per Week: 0 days   Minutes of Exercise per Session: 0 min  Stress: No Stress Concern Present   Feeling of Stress : Not at all  Social Connections: Moderately Isolated   Frequency of Communication with Friends and Family: More than three times a week   Frequency of Social Gatherings with Friends and Family: Once a week   Attends Religious Services: Never   Marine scientist or Organizations: No   Attends Music therapist: Never   Marital  Status: Married   Past Surgical History:  Procedure Laterality Date   ABDOMINAL AORTIC ANEURYSM REPAIR  2010   CAROTID ENDARTERECTOMY Left 09/20/2004   CATARACT EXTRACTION, BILATERAL     2016-2017   COLONOSCOPY     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 Cornett, MD;  Location: [place];  Service: General;  Laterality: Left;   INGUINAL HERNIA REPAIR Left    INSERTION OF MESH Left 02/05/2013   Procedure: INSERTION OF MESH;  Surgeon: Joyice Faster. Cornett, MD;  Location: [place];  Service: General;  Laterality: Left;   LEFT HEART CATH AND CORONARY ANGIOGRAPHY N/A 06/21/2018   Procedure: LEFT HEART CATH AND CORONARY ANGIOGRAPHY;  Surgeon: Jettie Booze, MD;  Location: [place] [facility];  Service: Cardiovascular;  Laterality: N/A;   SHOULDER ARTHROSCOPY WITH ROTATOR CUFF REPAIR AND SUBACROMIAL DECOMPRESSION Left 05/01/2014   Procedure: LEFT ARTHROSCOPY SHOULDER SUBACROMIAL DECOMPRESSION,DISTAL CLAVICAL RESECTION AND ROTATOR CUFF REPAIR;  Surgeon: Marin Shutter, MD;  Location: [place];  Service: Orthopedics;  Laterality: Left;   TEE WITHOUT CARDIOVERSION N/A 06/27/2018   Procedure: TRANSESOPHAGEAL ECHOCARDIOGRAM (TEE);  Surgeon: Melrose Nakayama, MD;  Location: [place];  Service: Open Heart Surgery;  Laterality: N/A;   TESTICLAR CYST EXCISION Left    TONSILLECTOMY     VASECTOMY     Past Surgical History:   Procedure Laterality Date   ABDOMINAL AORTIC ANEURYSM REPAIR  2010   CAROTID ENDARTERECTOMY Left 09/20/2004   CATARACT EXTRACTION, BILATERAL     2016-2017   COLONOSCOPY     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 Cornett, MD;  Location: [place];  Service: General;  Laterality: Left;   INGUINAL HERNIA REPAIR Left    INSERTION OF MESH Left 02/05/2013   Procedure: INSERTION OF MESH;  Surgeon: Joyice Faster. Cornett, MD;  Location: [place];  Service: General;  Laterality: Left;   LEFT HEART CATH AND CORONARY ANGIOGRAPHY N/A 06/21/2018   Procedure: LEFT HEART CATH AND CORONARY ANGIOGRAPHY;  Surgeon: Jettie Booze, MD;  Location: [place] [facility];  Service: Cardiovascular;  Laterality: N/A;   SHOULDER ARTHROSCOPY WITH ROTATOR CUFF REPAIR AND SUBACROMIAL DECOMPRESSION Left 05/01/2014   Procedure: LEFT ARTHROSCOPY SHOULDER SUBACROMIAL DECOMPRESSION,DISTAL CLAVICAL RESECTION AND ROTATOR CUFF REPAIR;  Surgeon: Marin Shutter, MD;  Location: [place];  Service: Orthopedics;  Laterality: Left;   TEE WITHOUT CARDIOVERSION N/A 06/27/2018   Procedure: TRANSESOPHAGEAL ECHOCARDIOGRAM (TEE);  Surgeon: Melrose Nakayama, MD;  Location: [place];  Service: Open Heart Surgery;  Laterality: N/A;   TESTICLAR CYST EXCISION Left    TONSILLECTOMY     VASECTOMY     Past Medical History:  Diagnosis Date   Arthritis    DDD- Lower  Back   Bradycardia    CAP (community acquired pneumonia) 03/17/2015   "THAT'S WHAT THEY ARE THINKING; THEY ARE NOT SURE"   Carotid artery occlusion    Chest pain 06/19/2018   Chronic lower back pain    DDD   CKD (chronic kidney disease)    Dr. 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      Muscle spasm    takes Zanaflex daily as needed   Peripheral edema    takes Furosemide daily   Pneumonia    hx of    Rheumatic fever 1946   Rheumatoid arthritis ([place])    Short-term memory loss    minimal   Shortness of breath dyspnea    with exertion but lasts very short period of time   TIA (transient ischemic attack)    x 2;takes Plavix daily   Urinary frequency    takes Flomax daily   Urinary urgency    BP 133/72   Pulse 69   Temp (!) 97.5 F (36.4 C)   Ht 5\' 7"  (1.702 m)   Wt 161 lb 11.2 oz (73.3 kg)   SpO2 95%   BMI 25.33 kg/m   Opioid Risk Score:   Fall Risk Score:  `1  Depression screen PHQ 2/9  Depression screen [facility] 2/9 11/05/2020 09/22/2020 08/13/2020 06/11/2020 06/05/2020 05/11/2020 04/09/2020  Decreased Interest 0 0 0 0 0 0 1  Down, Depressed, Hopeless 0 0 0 0 0 0 1  PHQ - 2 Score 0 0 0 0 0 0 2  Altered sleeping - - - - - - -  Tired, decreased energy - - - - - - -  Change in appetite - - - - - - -  Feeling bad or failure about yourself  - - - - - - -  Trouble concentrating - - - - - - -  Moving slowly or fidgety/restless - - -  - - - -  PHQ-9 Score - - - - - - -  Some recent data might be hidden      Review of Systems  Constitutional: Negative.   HENT: Negative.    Eyes: Negative.   Respiratory: Negative.    Cardiovascular: Negative.   Gastrointestinal: Negative.   Endocrine: Negative.   Genitourinary: Negative.   Musculoskeletal:  Positive for back pain and gait problem.       Left knee pain , left hip pain ,   Skin: Negative.   Allergic/Immunologic: Negative.   Psychiatric/Behavioral: Negative.        Objective:   Physical Exam Vitals and nursing note reviewed.  Musculoskeletal:     Comments: No Physical Exam: My-Chart Video Visit         Assessment & Plan:  1. Lumbar Spondylosis/ Lumbar Stenosis: 12/07/2020 Continue current medication regime. Refilled: Oxycodone 7.5/325 mg one tablet every 8 hours as needed #80. 0711/2022.We will continue the opioid monitoring program, this consists of regular clinic visits, examinations, urine drug screen, pill counts as well as use of [place] Controlled Substance Reporting system. A 12 month History has been reviewed on the [place] on 12/07/2020. 2. Bilateral Knees OA/Chronic Bilateral Knee Pain: S/P Bilateral Knee  Injection by Dr Tamala Julian  on 12/09/2019, Orthopedist Following 12/07/2020. 3. Left Greater Trochanter Bursitis: Orthopedist Following. Continue to alternate Ice and Heat Therapy. 07/112022  4. Chronic Right Leg Pain: No complaints today.Continue Current Medication Regimen. Continue to Monitor. 07/11/ 2022 5. Left Knee Pain: He has an appointment with ortho. Continue to monitor.   F/U in 1 month   My-Chart Video Visit Established Patient Location of Patient: In his Home Location of Provider in the Office

## 2020-12-24 ENCOUNTER — Other Ambulatory Visit: Payer: Self-pay | Admitting: Family Medicine

## 2021-01-06 ENCOUNTER — Other Ambulatory Visit: Payer: Self-pay | Admitting: Family Medicine

## 2021-02-23 ENCOUNTER — Other Ambulatory Visit: Payer: Self-pay | Admitting: Family Medicine

## 2021-03-31 DIAGNOSIS — Z9889 Other specified postprocedural states: Secondary | ICD-10-CM | POA: Diagnosis not present

## 2021-03-31 DIAGNOSIS — K8689 Other specified diseases of pancreas: Secondary | ICD-10-CM | POA: Diagnosis not present

## 2021-03-31 DIAGNOSIS — I7090 Unspecified atherosclerosis: Secondary | ICD-10-CM | POA: Diagnosis not present

## 2021-03-31 DIAGNOSIS — M7989 Other specified soft tissue disorders: Secondary | ICD-10-CM | POA: Diagnosis not present

## 2021-03-31 DIAGNOSIS — Z8679 Personal history of other diseases of the circulatory system: Secondary | ICD-10-CM | POA: Diagnosis not present

## 2021-03-31 DIAGNOSIS — K551 Chronic vascular disorders of intestine: Secondary | ICD-10-CM | POA: Diagnosis not present

## 2021-03-31 DIAGNOSIS — I70203 Unspecified atherosclerosis of native arteries of extremities, bilateral legs: Secondary | ICD-10-CM | POA: Diagnosis not present

## 2021-03-31 DIAGNOSIS — I723 Aneurysm of iliac artery: Secondary | ICD-10-CM | POA: Diagnosis not present

## 2021-03-31 DIAGNOSIS — K8020 Calculus of gallbladder without cholecystitis without obstruction: Secondary | ICD-10-CM | POA: Diagnosis not present

## 2021-03-31 DIAGNOSIS — I714 Abdominal aortic aneurysm, without rupture, unspecified: Secondary | ICD-10-CM | POA: Diagnosis not present

## 2021-04-27 DIAGNOSIS — I519 Heart disease, unspecified: Secondary | ICD-10-CM | POA: Diagnosis not present

## 2021-04-27 DIAGNOSIS — I2510 Atherosclerotic heart disease of native coronary artery without angina pectoris: Secondary | ICD-10-CM | POA: Diagnosis not present

## 2021-04-27 DIAGNOSIS — D631 Anemia in chronic kidney disease: Secondary | ICD-10-CM | POA: Diagnosis not present

## 2021-04-27 DIAGNOSIS — I21A1 Myocardial infarction type 2: Secondary | ICD-10-CM | POA: Diagnosis not present

## 2021-04-27 DIAGNOSIS — I517 Cardiomegaly: Secondary | ICD-10-CM | POA: Diagnosis not present

## 2021-04-27 DIAGNOSIS — A419 Sepsis, unspecified organism: Secondary | ICD-10-CM | POA: Diagnosis not present

## 2021-04-27 DIAGNOSIS — I129 Hypertensive chronic kidney disease with stage 1 through stage 4 chronic kidney disease, or unspecified chronic kidney disease: Secondary | ICD-10-CM | POA: Diagnosis not present

## 2021-04-27 DIAGNOSIS — J09X1 Influenza due to identified novel influenza A virus with pneumonia: Secondary | ICD-10-CM | POA: Diagnosis not present

## 2021-04-27 DIAGNOSIS — J9811 Atelectasis: Secondary | ICD-10-CM | POA: Diagnosis not present

## 2021-04-27 DIAGNOSIS — J189 Pneumonia, unspecified organism: Secondary | ICD-10-CM | POA: Diagnosis not present

## 2021-04-27 DIAGNOSIS — Z951 Presence of aortocoronary bypass graft: Secondary | ICD-10-CM | POA: Diagnosis not present

## 2021-04-27 DIAGNOSIS — J101 Influenza due to other identified influenza virus with other respiratory manifestations: Secondary | ICD-10-CM | POA: Diagnosis not present

## 2021-04-27 DIAGNOSIS — I340 Nonrheumatic mitral (valve) insufficiency: Secondary | ICD-10-CM | POA: Diagnosis not present

## 2021-04-27 DIAGNOSIS — N1832 Chronic kidney disease, stage 3b: Secondary | ICD-10-CM | POA: Diagnosis not present

## 2021-04-27 DIAGNOSIS — J1000 Influenza due to other identified influenza virus with unspecified type of pneumonia: Secondary | ICD-10-CM | POA: Diagnosis not present

## 2021-04-27 DIAGNOSIS — I480 Paroxysmal atrial fibrillation: Secondary | ICD-10-CM | POA: Diagnosis not present

## 2021-04-27 DIAGNOSIS — I10 Essential (primary) hypertension: Secondary | ICD-10-CM | POA: Diagnosis not present

## 2021-04-27 DIAGNOSIS — G20 Parkinson's disease: Secondary | ICD-10-CM | POA: Diagnosis not present

## 2021-04-27 DIAGNOSIS — R778 Other specified abnormalities of plasma proteins: Secondary | ICD-10-CM | POA: Diagnosis not present

## 2021-04-27 DIAGNOSIS — R0902 Hypoxemia: Secondary | ICD-10-CM | POA: Diagnosis not present

## 2021-04-27 DIAGNOSIS — J9601 Acute respiratory failure with hypoxia: Secondary | ICD-10-CM | POA: Diagnosis not present

## 2021-04-30 DIAGNOSIS — J9601 Acute respiratory failure with hypoxia: Secondary | ICD-10-CM | POA: Diagnosis not present

## 2021-04-30 DIAGNOSIS — J09X1 Influenza due to identified novel influenza A virus with pneumonia: Secondary | ICD-10-CM | POA: Diagnosis not present

## 2021-04-30 DIAGNOSIS — J189 Pneumonia, unspecified organism: Secondary | ICD-10-CM | POA: Diagnosis not present

## 2021-04-30 DIAGNOSIS — A419 Sepsis, unspecified organism: Secondary | ICD-10-CM | POA: Diagnosis not present

## 2021-05-06 DIAGNOSIS — J101 Influenza due to other identified influenza virus with other respiratory manifestations: Secondary | ICD-10-CM | POA: Diagnosis not present

## 2021-05-06 DIAGNOSIS — A419 Sepsis, unspecified organism: Secondary | ICD-10-CM | POA: Diagnosis not present

## 2021-05-06 DIAGNOSIS — D509 Iron deficiency anemia, unspecified: Secondary | ICD-10-CM | POA: Diagnosis not present

## 2021-05-06 DIAGNOSIS — N183 Chronic kidney disease, stage 3 unspecified: Secondary | ICD-10-CM | POA: Diagnosis not present

## 2021-05-06 DIAGNOSIS — J189 Pneumonia, unspecified organism: Secondary | ICD-10-CM | POA: Diagnosis not present

## 2021-05-12 DIAGNOSIS — Z951 Presence of aortocoronary bypass graft: Secondary | ICD-10-CM | POA: Diagnosis not present

## 2021-05-12 DIAGNOSIS — Z9889 Other specified postprocedural states: Secondary | ICD-10-CM | POA: Diagnosis not present

## 2021-05-12 DIAGNOSIS — I2510 Atherosclerotic heart disease of native coronary artery without angina pectoris: Secondary | ICD-10-CM | POA: Diagnosis not present

## 2021-05-12 DIAGNOSIS — E785 Hyperlipidemia, unspecified: Secondary | ICD-10-CM | POA: Diagnosis not present

## 2021-05-12 DIAGNOSIS — N183 Chronic kidney disease, stage 3 unspecified: Secondary | ICD-10-CM | POA: Diagnosis not present

## 2021-05-12 DIAGNOSIS — I69398 Other sequelae of cerebral infarction: Secondary | ICD-10-CM | POA: Diagnosis not present

## 2021-05-12 DIAGNOSIS — J09X1 Influenza due to identified novel influenza A virus with pneumonia: Secondary | ICD-10-CM | POA: Diagnosis not present

## 2021-05-12 DIAGNOSIS — I7143 Infrarenal abdominal aortic aneurysm, without rupture: Secondary | ICD-10-CM | POA: Diagnosis not present

## 2021-05-12 DIAGNOSIS — I10 Essential (primary) hypertension: Secondary | ICD-10-CM | POA: Diagnosis not present

## 2021-05-31 ENCOUNTER — Other Ambulatory Visit: Payer: Self-pay | Admitting: Family Medicine

## 2021-07-01 DIAGNOSIS — D509 Iron deficiency anemia, unspecified: Secondary | ICD-10-CM | POA: Diagnosis not present

## 2021-07-01 DIAGNOSIS — E785 Hyperlipidemia, unspecified: Secondary | ICD-10-CM | POA: Diagnosis not present

## 2021-07-01 DIAGNOSIS — I10 Essential (primary) hypertension: Secondary | ICD-10-CM | POA: Diagnosis not present

## 2021-07-01 DIAGNOSIS — M899 Disorder of bone, unspecified: Secondary | ICD-10-CM | POA: Diagnosis not present

## 2021-07-01 DIAGNOSIS — R7301 Impaired fasting glucose: Secondary | ICD-10-CM | POA: Diagnosis not present

## 2021-07-01 DIAGNOSIS — Z125 Encounter for screening for malignant neoplasm of prostate: Secondary | ICD-10-CM | POA: Diagnosis not present

## 2021-07-06 DIAGNOSIS — I4891 Unspecified atrial fibrillation: Secondary | ICD-10-CM | POA: Diagnosis not present

## 2021-07-06 DIAGNOSIS — R7989 Other specified abnormal findings of blood chemistry: Secondary | ICD-10-CM | POA: Diagnosis not present

## 2021-07-06 DIAGNOSIS — I10 Essential (primary) hypertension: Secondary | ICD-10-CM | POA: Diagnosis not present

## 2021-07-06 DIAGNOSIS — Z0000 Encounter for general adult medical examination without abnormal findings: Secondary | ICD-10-CM | POA: Diagnosis not present

## 2021-07-06 DIAGNOSIS — G20 Parkinson's disease: Secondary | ICD-10-CM | POA: Diagnosis not present

## 2021-07-06 DIAGNOSIS — R7301 Impaired fasting glucose: Secondary | ICD-10-CM | POA: Diagnosis not present

## 2021-07-06 DIAGNOSIS — D509 Iron deficiency anemia, unspecified: Secondary | ICD-10-CM | POA: Diagnosis not present

## 2021-07-06 DIAGNOSIS — E785 Hyperlipidemia, unspecified: Secondary | ICD-10-CM | POA: Diagnosis not present

## 2021-08-05 DIAGNOSIS — Z9189 Other specified personal risk factors, not elsewhere classified: Secondary | ICD-10-CM | POA: Diagnosis not present

## 2021-08-05 DIAGNOSIS — R600 Localized edema: Secondary | ICD-10-CM | POA: Diagnosis not present

## 2021-08-11 ENCOUNTER — Other Ambulatory Visit: Payer: Self-pay | Admitting: Family Medicine

## 2021-08-12 DIAGNOSIS — R600 Localized edema: Secondary | ICD-10-CM | POA: Diagnosis not present

## 2021-08-22 ENCOUNTER — Other Ambulatory Visit: Payer: Self-pay | Admitting: Family Medicine

## 2021-08-23 DIAGNOSIS — M170 Bilateral primary osteoarthritis of knee: Secondary | ICD-10-CM | POA: Diagnosis not present

## 2021-09-15 ENCOUNTER — Other Ambulatory Visit: Payer: Self-pay | Admitting: Family Medicine

## 2021-10-07 ENCOUNTER — Other Ambulatory Visit: Payer: Self-pay | Admitting: Family Medicine

## 2021-11-05 ENCOUNTER — Other Ambulatory Visit: Payer: Self-pay | Admitting: Family Medicine
